# Patient Record
Sex: Female | Born: 1940
Health system: Southern US, Community
[De-identification: ages and names within clinical notes are randomized; demographics above are authoritative.]

## PROBLEM LIST (undated history)

## (undated) DIAGNOSIS — C801 Malignant (primary) neoplasm, unspecified: Secondary | ICD-10-CM

## (undated) DIAGNOSIS — I2510 Atherosclerotic heart disease of native coronary artery without angina pectoris: Secondary | ICD-10-CM

## (undated) DIAGNOSIS — K219 Gastro-esophageal reflux disease without esophagitis: Secondary | ICD-10-CM

## (undated) DIAGNOSIS — K56609 Unspecified intestinal obstruction, unspecified as to partial versus complete obstruction: Secondary | ICD-10-CM

## (undated) DIAGNOSIS — Z9889 Other specified postprocedural states: Secondary | ICD-10-CM

## (undated) DIAGNOSIS — I10 Essential (primary) hypertension: Secondary | ICD-10-CM

## (undated) DIAGNOSIS — D649 Anemia, unspecified: Secondary | ICD-10-CM

## (undated) DIAGNOSIS — Z923 Personal history of irradiation: Secondary | ICD-10-CM

## (undated) DIAGNOSIS — Z9221 Personal history of antineoplastic chemotherapy: Secondary | ICD-10-CM

## (undated) DIAGNOSIS — N189 Chronic kidney disease, unspecified: Secondary | ICD-10-CM

## (undated) DIAGNOSIS — R112 Nausea with vomiting, unspecified: Secondary | ICD-10-CM

## (undated) DIAGNOSIS — J189 Pneumonia, unspecified organism: Secondary | ICD-10-CM

## (undated) HISTORY — PX: APPENDECTOMY: SHX54

## (undated) HISTORY — PX: BREAST LUMPECTOMY: SHX2

## (undated) HISTORY — PX: ABDOMINAL HYSTERECTOMY: SHX81

## (undated) HISTORY — PX: ABDOMINAL SURGERY: SHX537

## (undated) HISTORY — PX: AUGMENTATION MAMMAPLASTY: SUR837

## (undated) HISTORY — PX: TONSILLECTOMY: SUR1361

## (undated) HISTORY — PX: BREAST EXCISIONAL BIOPSY: SUR124

---

## 2000-02-26 ENCOUNTER — Encounter: Payer: Self-pay | Admitting: Family Medicine

## 2000-02-26 ENCOUNTER — Encounter: Admission: RE | Admit: 2000-02-26 | Discharge: 2000-02-26 | Payer: Self-pay | Admitting: Family Medicine

## 2001-06-18 ENCOUNTER — Encounter: Admission: RE | Admit: 2001-06-18 | Discharge: 2001-06-18 | Payer: Self-pay | Admitting: Family Medicine

## 2001-06-18 ENCOUNTER — Encounter: Payer: Self-pay | Admitting: Family Medicine

## 2001-06-19 ENCOUNTER — Encounter: Payer: Self-pay | Admitting: Family Medicine

## 2001-06-19 ENCOUNTER — Encounter: Admission: RE | Admit: 2001-06-19 | Discharge: 2001-06-19 | Payer: Self-pay | Admitting: Family Medicine

## 2002-06-28 ENCOUNTER — Encounter: Payer: Self-pay | Admitting: Family Medicine

## 2002-06-28 ENCOUNTER — Encounter: Admission: RE | Admit: 2002-06-28 | Discharge: 2002-06-28 | Payer: Self-pay | Admitting: Family Medicine

## 2004-04-17 ENCOUNTER — Encounter: Admission: RE | Admit: 2004-04-17 | Discharge: 2004-04-17 | Payer: Self-pay | Admitting: Family Medicine

## 2004-04-26 ENCOUNTER — Encounter: Admission: RE | Admit: 2004-04-26 | Discharge: 2004-04-26 | Payer: Self-pay | Admitting: Family Medicine

## 2004-10-09 ENCOUNTER — Encounter: Admission: RE | Admit: 2004-10-09 | Discharge: 2004-10-09 | Payer: Self-pay | Admitting: Family Medicine

## 2005-06-18 ENCOUNTER — Encounter: Admission: RE | Admit: 2005-06-18 | Discharge: 2005-06-18 | Payer: Self-pay | Admitting: Family Medicine

## 2005-08-16 ENCOUNTER — Encounter: Admission: RE | Admit: 2005-08-16 | Discharge: 2005-08-16 | Payer: Self-pay | Admitting: Family Medicine

## 2006-07-17 ENCOUNTER — Encounter: Admission: RE | Admit: 2006-07-17 | Discharge: 2006-07-17 | Payer: Self-pay | Admitting: Family Medicine

## 2006-07-17 IMAGING — MG MM DIAGNOSTIC BILATERAL
4 series · 4 of 4 positions shown · non-contrast
Comparison: none

DG DIAGNOSTIC BILATERAL
Bilateral CC and MLO view(s) were taken.

DIGITAL BILATERAL DIAGNOSTIC MAMMOGRAM:
CLINICAL DATA: Follow-up left breast calcifications.
Comparison studies are dated [DATE] and [DATE].
The dense fibroglandular tissue is symmetric and stable.  Free silicone on the right and capsular 
calcifications in both breasts are unchanged. No dominant masses, suspicious calcifications, or 
areas of non-surgical distortion are seen bilaterally.

[R CC]
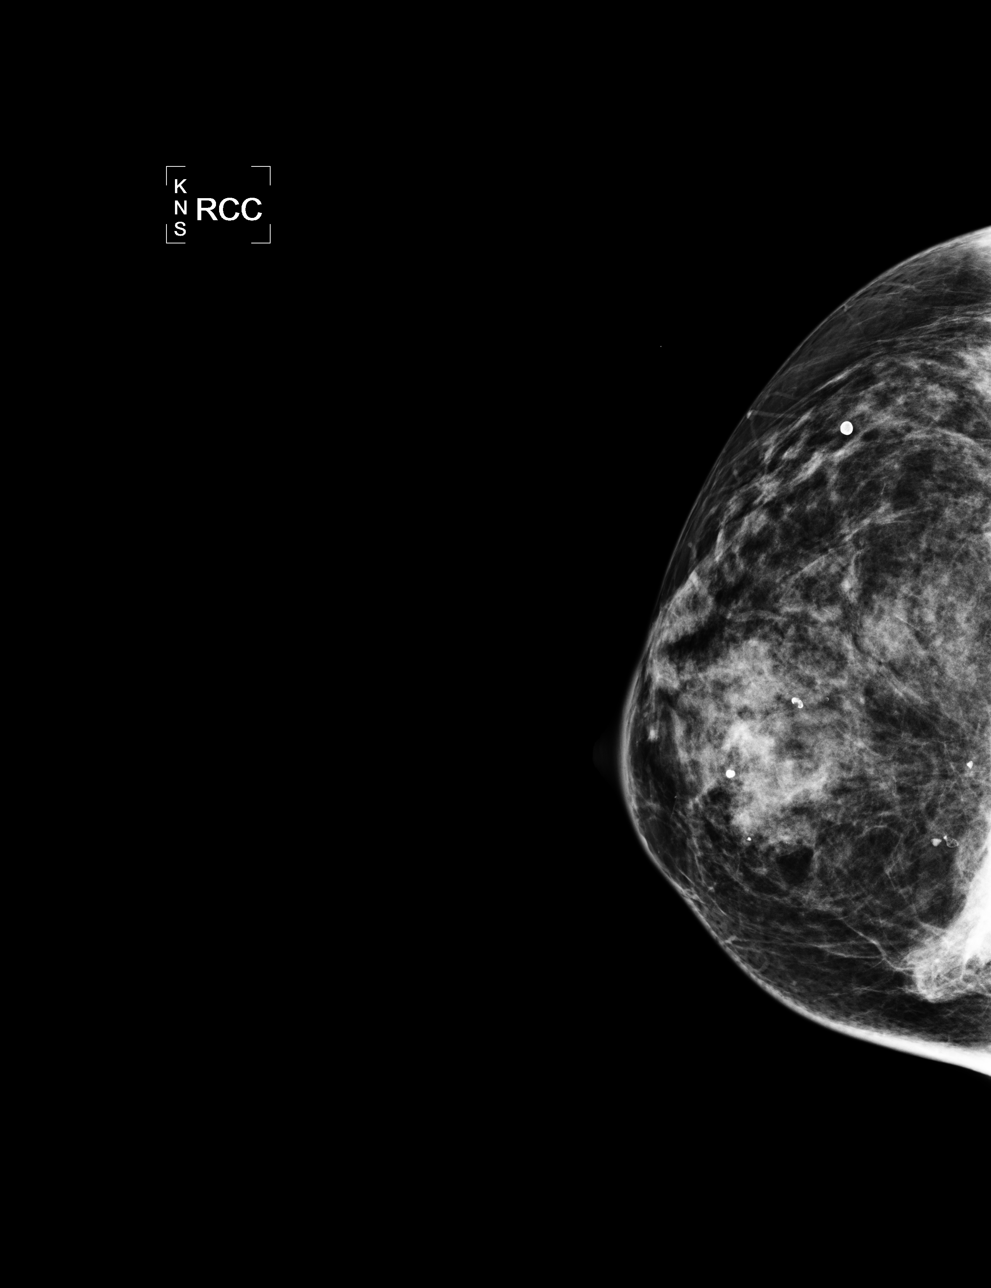

[L CC]
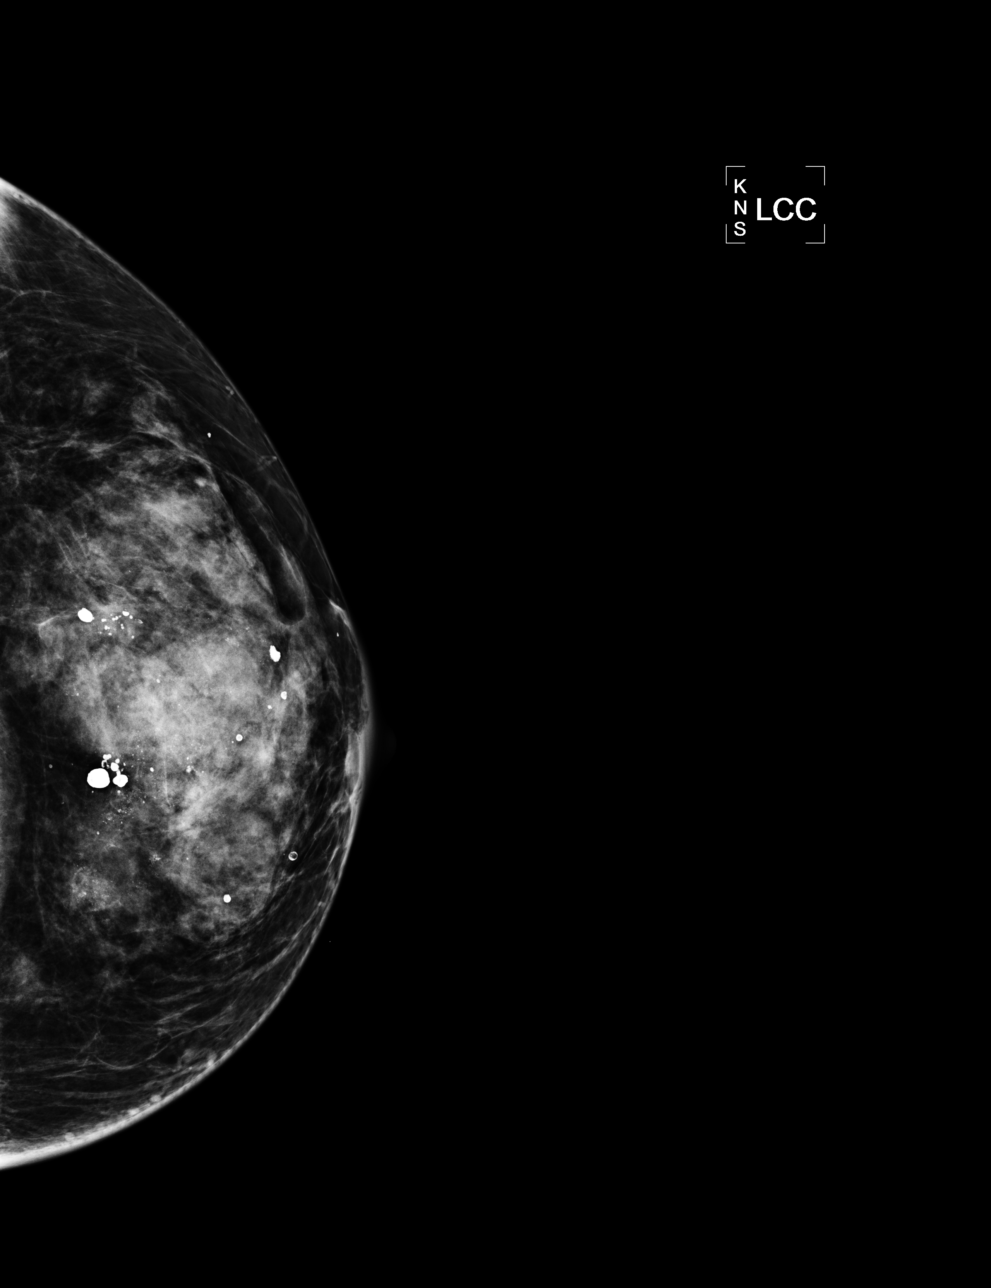

[L MLO]
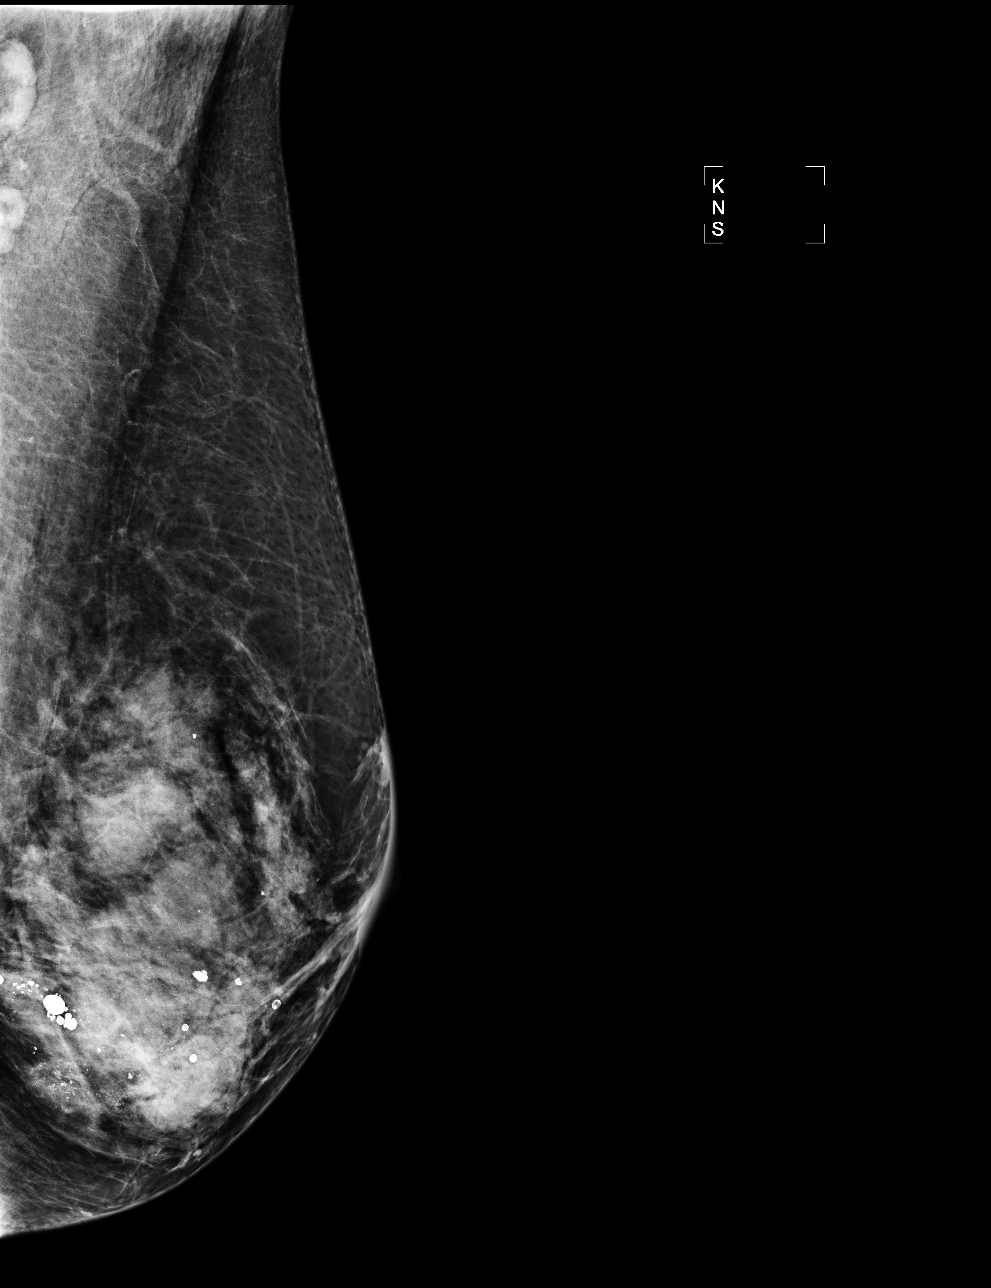

[R MLO]
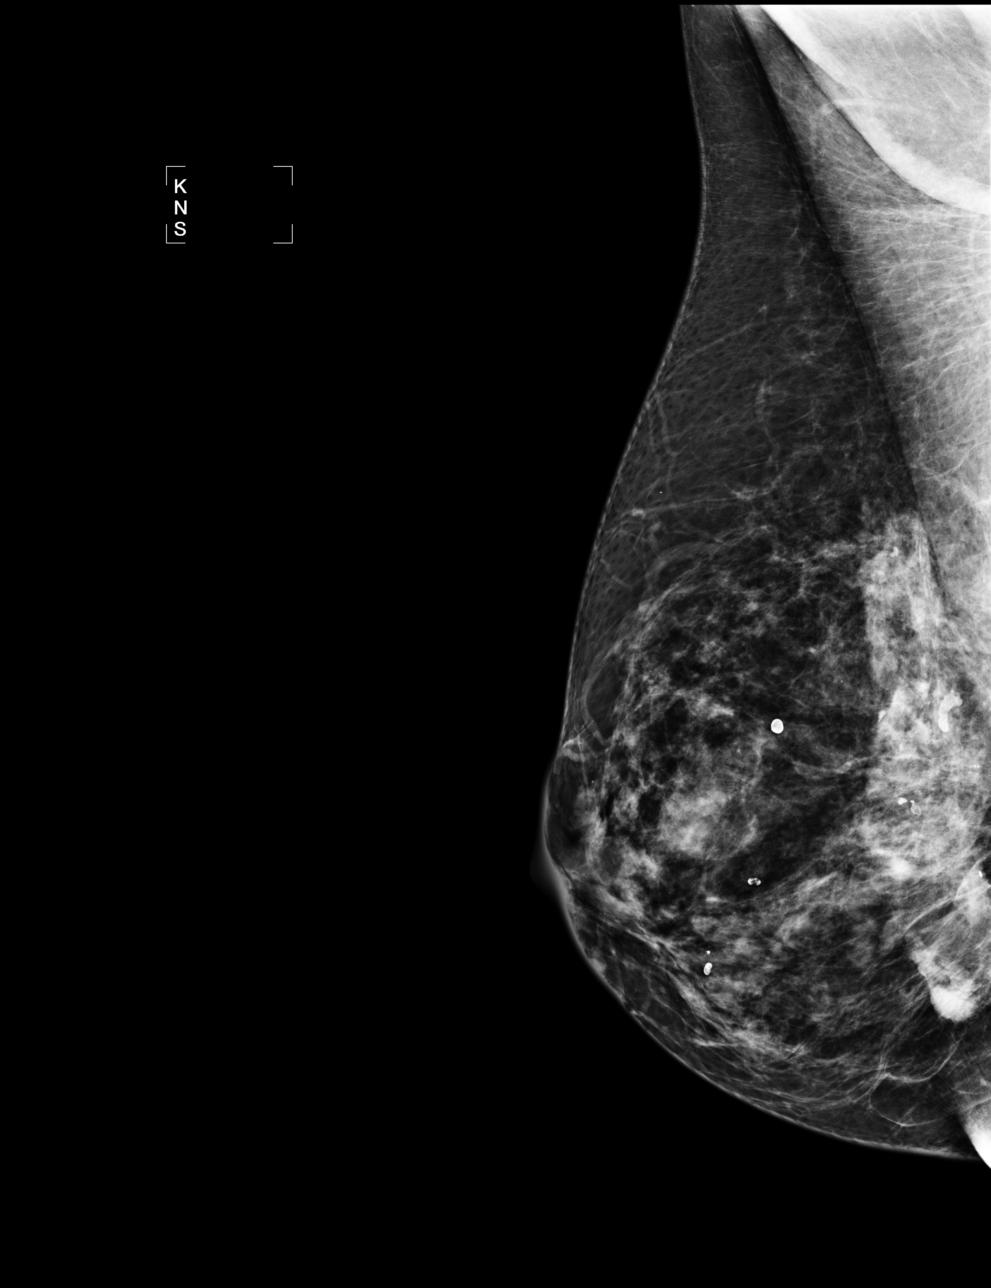

[4 of 4 positions shown; findings below may reference images not displayed]

IMPRESSION: Stable mammogram with no specific radiographic evidence of malignancy bilaterally. Screening 
mammogram in one year is recommended.

ASSESSMENT: Benign - BI-RADS 2

Routine screening mammogram in 1 year.
, THIS PROCEDURE WAS A DIGITAL MAMMOGRAM

## 2007-09-16 DIAGNOSIS — J309 Allergic rhinitis, unspecified: Secondary | ICD-10-CM | POA: Insufficient documentation

## 2007-09-16 DIAGNOSIS — M810 Age-related osteoporosis without current pathological fracture: Secondary | ICD-10-CM | POA: Insufficient documentation

## 2007-11-03 ENCOUNTER — Encounter: Admission: RE | Admit: 2007-11-03 | Discharge: 2007-11-03 | Payer: Self-pay | Admitting: Family Medicine

## 2007-11-03 IMAGING — MG MM SCREEN MAMMOGRAM BILATERAL
4 series · 4 of 4 positions shown · non-contrast
Comparison: Prior studies.

DG SCREEN MAMMOGRAM BILATERAL
Bilateral CC and MLO view(s) were taken.
Prior study comparison: [DATE], bilateral diagnostic mammogram.

DIGITAL SCREENING MAMMOGRAM WITH CAD:

[R CC]
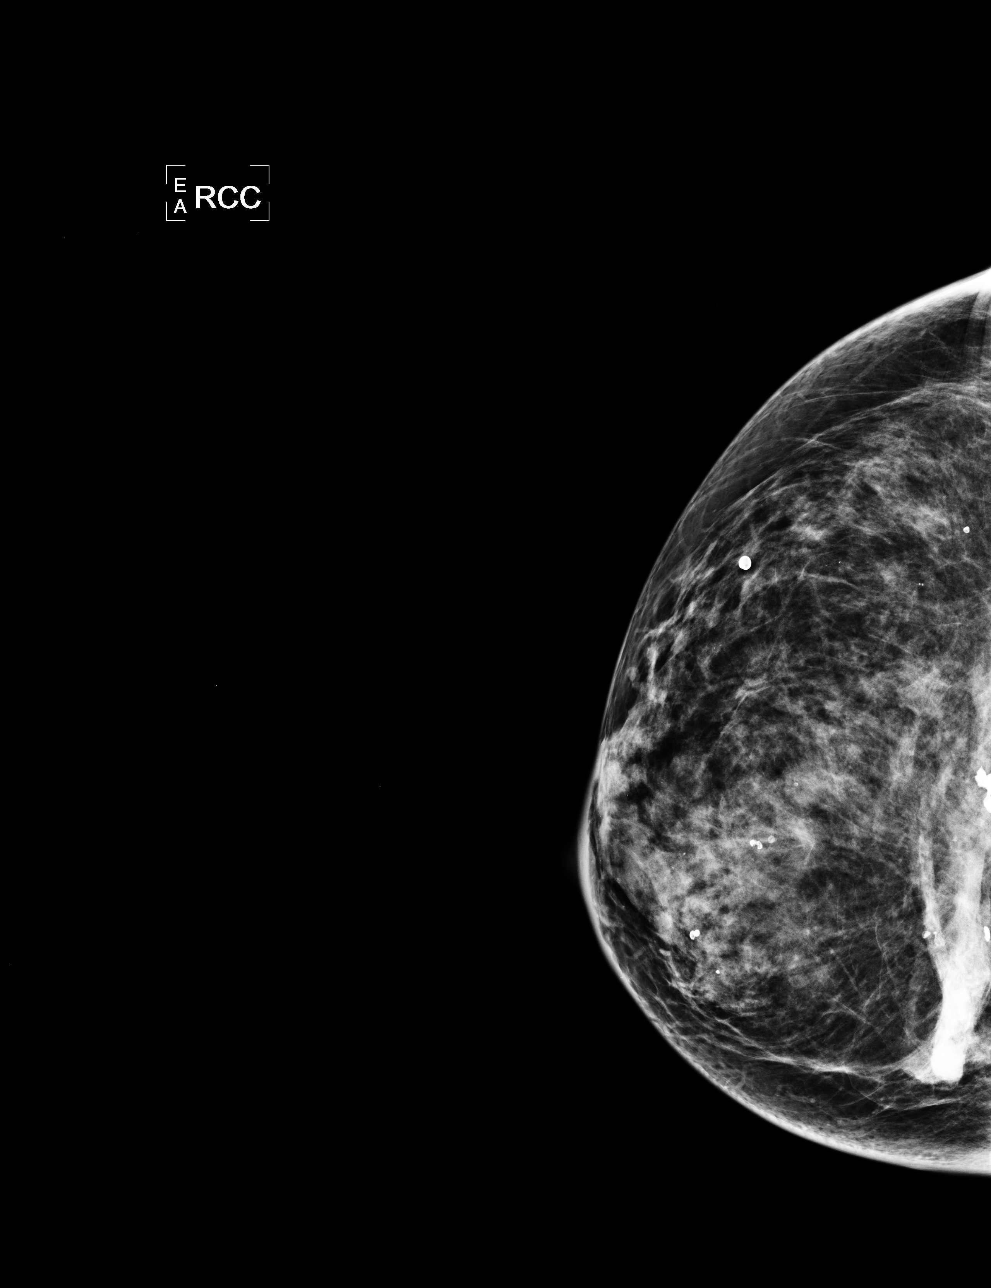

[L CC]
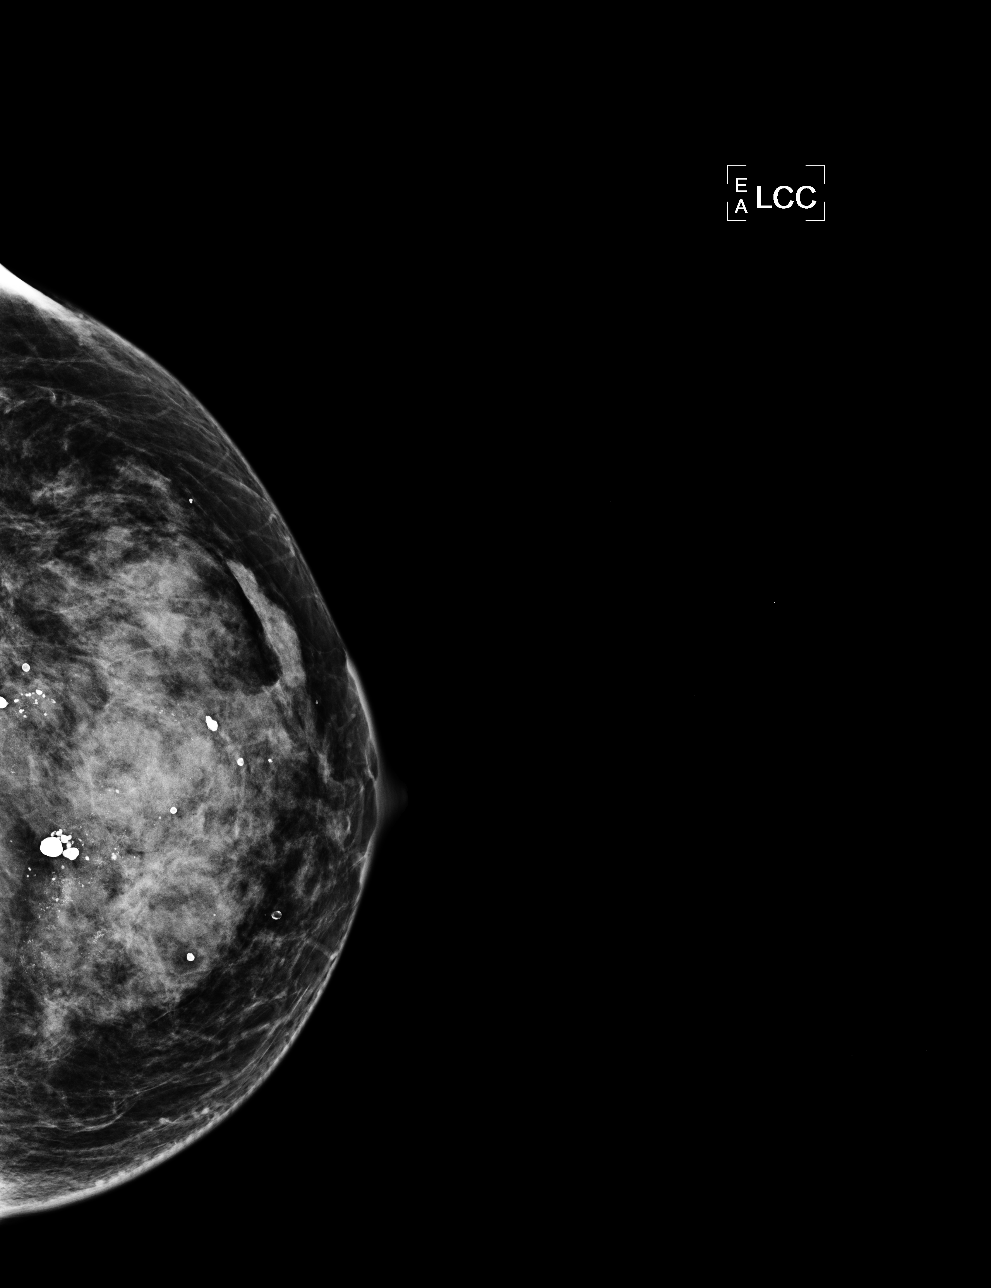

[L MLO]
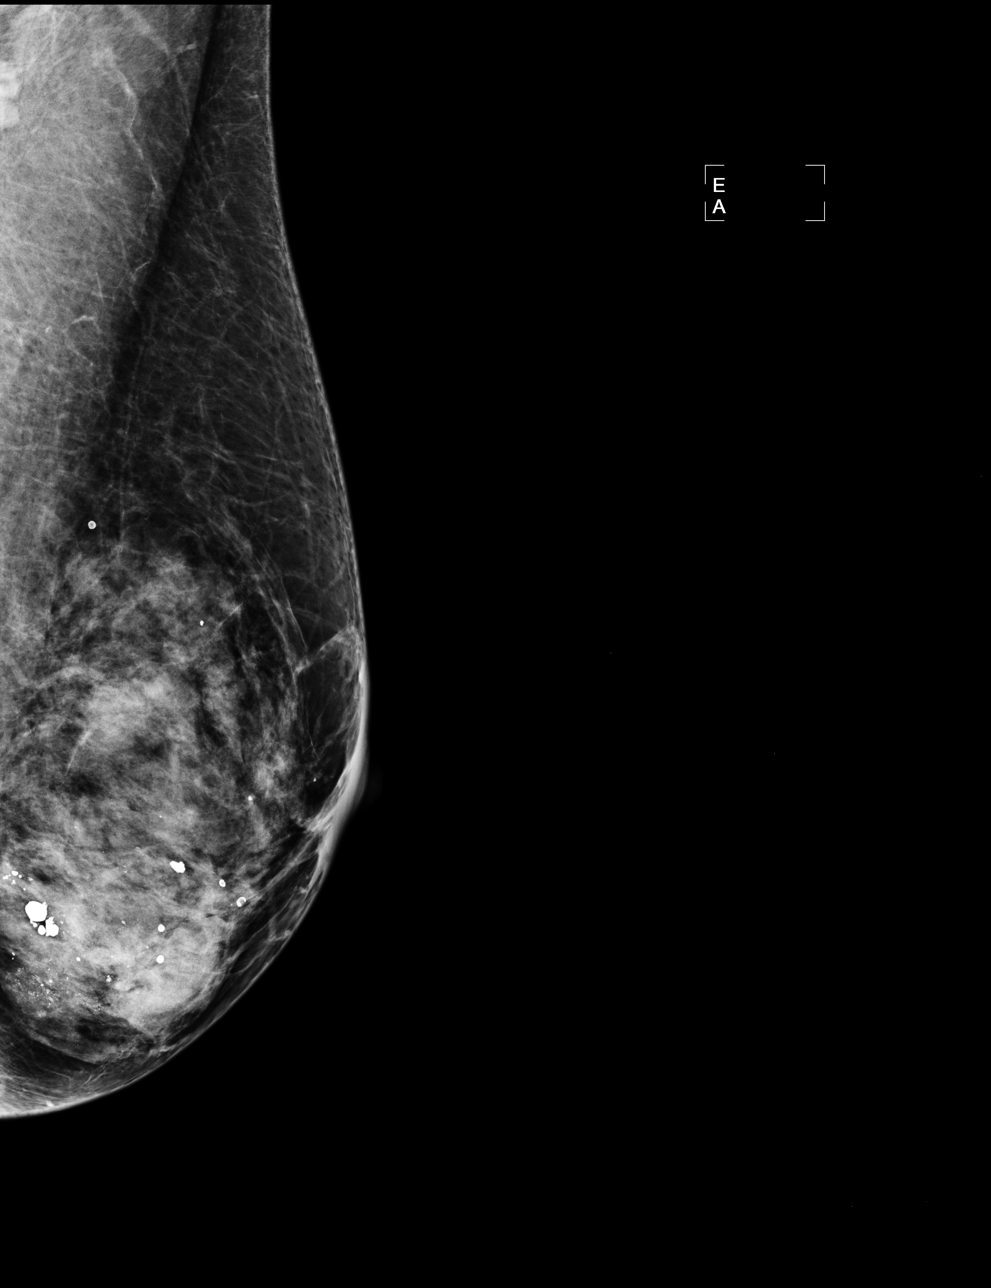

[R MLO]
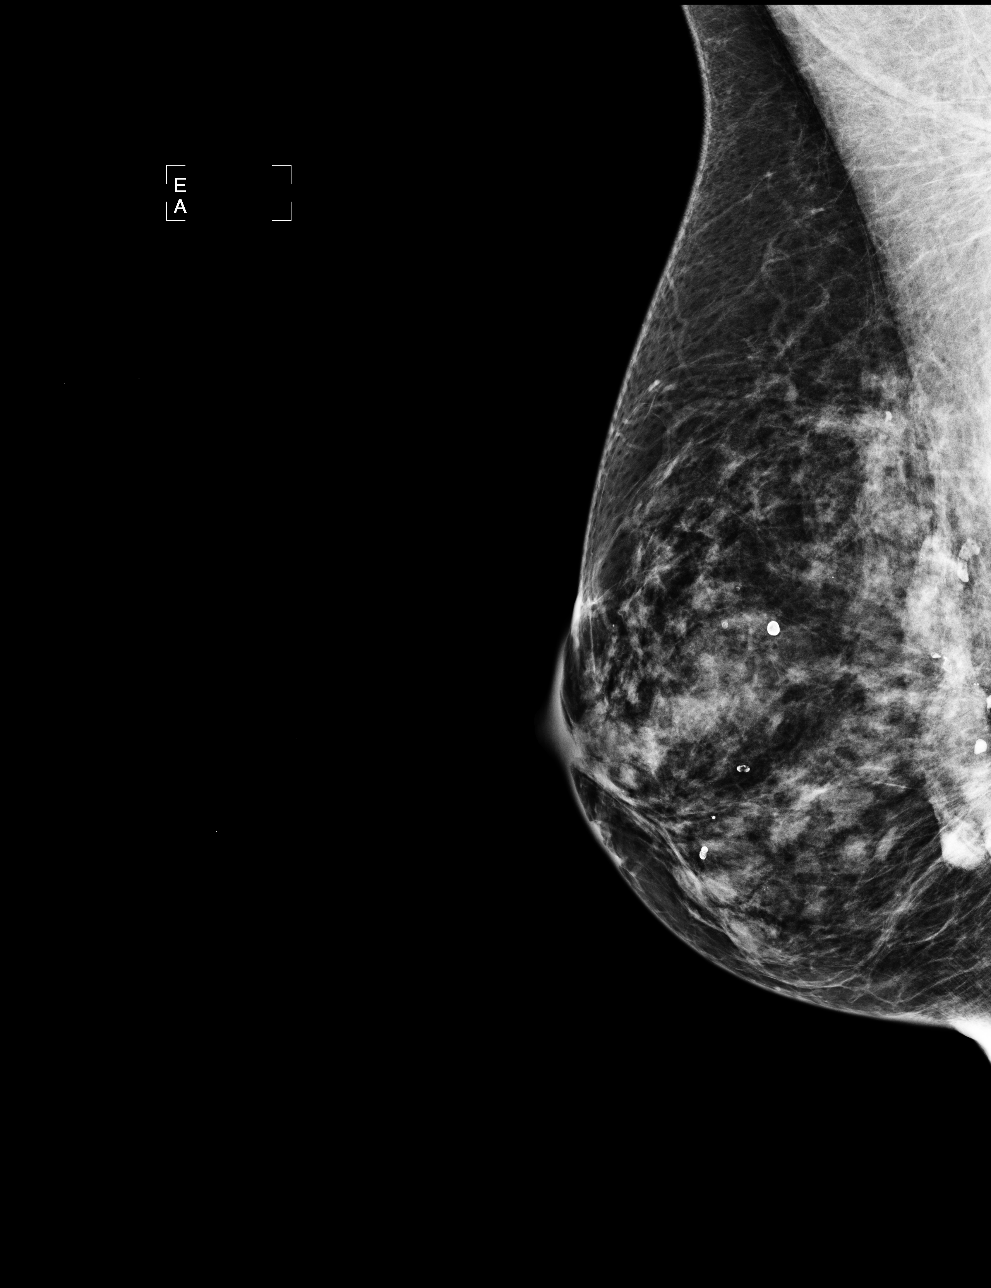

[4 of 4 positions shown; findings below may reference images not displayed]

The breast tissue is heterogeneously dense.  There is no dominant mass, architectural distortion or
calcification to suggest malignancy.
IMPRESSION: No mammographic evidence of malignancy.  Suggest yearly screening mammography.

ASSESSMENT: Negative - BI-RADS 1

Screening mammogram in 1 year.
ANALYZED BY COMPUTER AIDED DETECTION. , THIS PROCEDURE WAS A DIGITAL MAMMOGRAM.

## 2008-06-03 DIAGNOSIS — R509 Fever, unspecified: Secondary | ICD-10-CM | POA: Insufficient documentation

## 2008-12-07 ENCOUNTER — Encounter: Admission: RE | Admit: 2008-12-07 | Discharge: 2008-12-07 | Payer: Self-pay | Admitting: Family Medicine

## 2008-12-07 IMAGING — MG MM SCREEN MAMMOGRAM BILATERAL
2 series · 2 of 2 positions shown · non-contrast
Comparison: none

DG SCREEN MAMMOGRAM BILATERAL
Bilateral CC and MLO view(s) were taken.

DIGITAL SCREENING MAMMOGRAM WITH CAD:
The breast tissue is heterogeneously dense.  No masses or malignant type calcifications are 
identified.  Compared with prior studies.

[L CC]
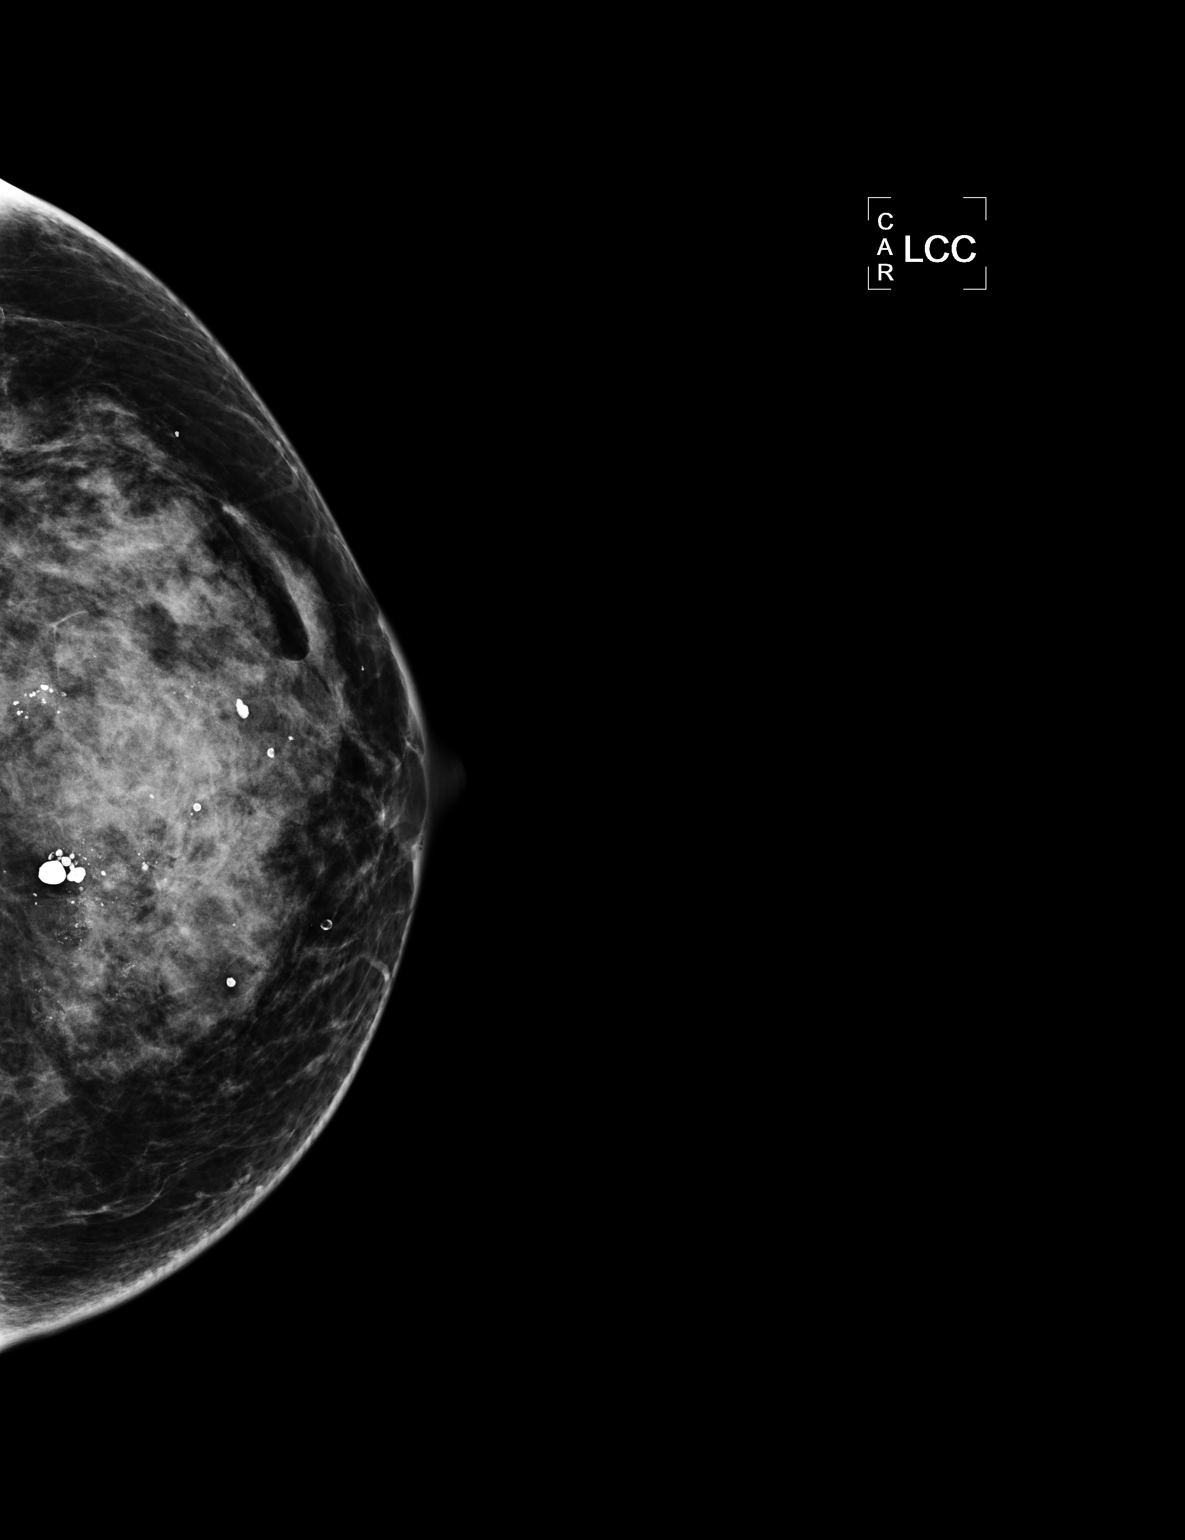

[R MLO]
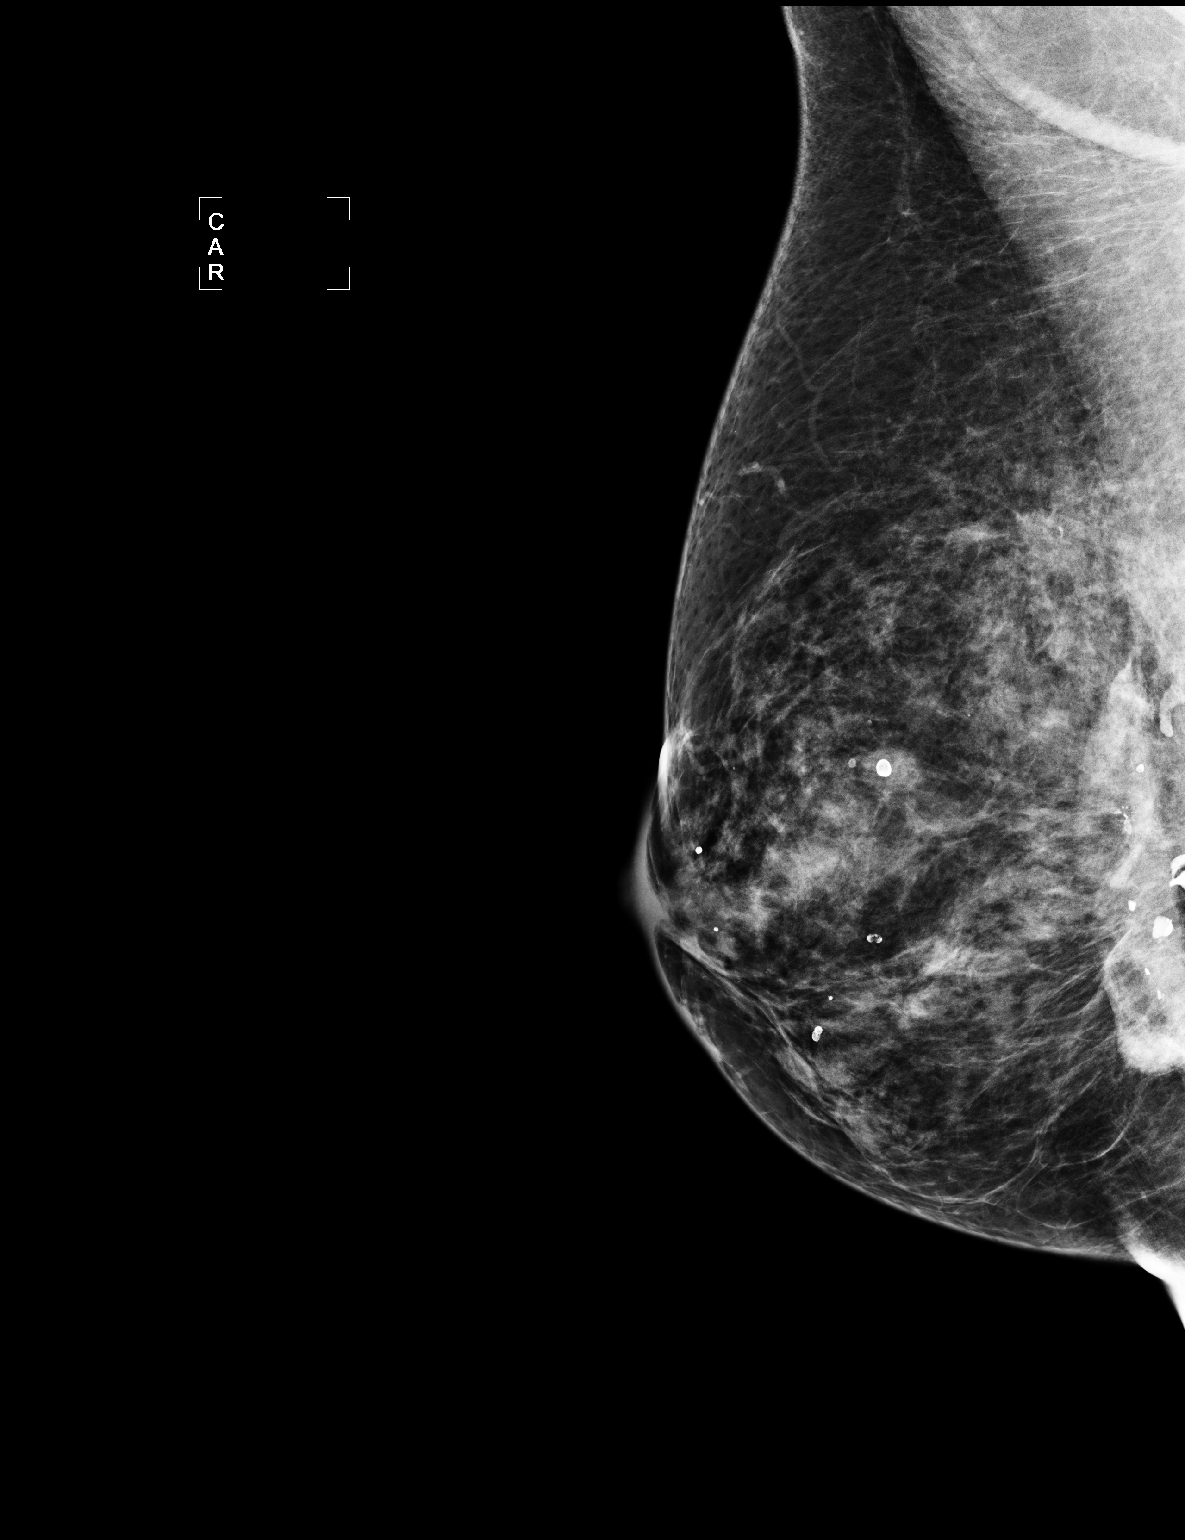

[2 of 2 positions shown; findings below may reference images not displayed]

IMPRESSION: No specific mammographic evidence of malignancy.  Next screening mammogram is recommended in one 
year.

A result letter of this screening mammogram will be mailed directly to the patient.

ASSESSMENT: Negative - BI-RADS 1

Screening mammogram in 1 year.
ANALYZED BY COMPUTER AIDED DETECTION. , THIS PROCEDURE WAS A DIGITAL MAMMOGRAM.

## 2010-03-21 ENCOUNTER — Encounter: Admission: RE | Admit: 2010-03-21 | Discharge: 2010-03-21 | Payer: Self-pay | Admitting: Family Medicine

## 2010-03-21 IMAGING — MG MM DIGITAL SCREENING
4 series · 4 of 4 positions shown · non-contrast
Comparison: none

DG SCREEN MAMMOGRAM BILATERAL
Bilateral CC and MLO view(s) were taken.

DIGITAL SCREENING MAMMOGRAM WITH CAD:
The breast tissue is heterogeneously dense  A possible mass is noted in the left breast.  Spot 
compression views and possibly sonography are recommended for further evaluation.  In the right 
breast, no masses or malignant type calcifications are identified.  Compared with prior studies 
[DATE].
Images were processed with CAD.

[R CC]
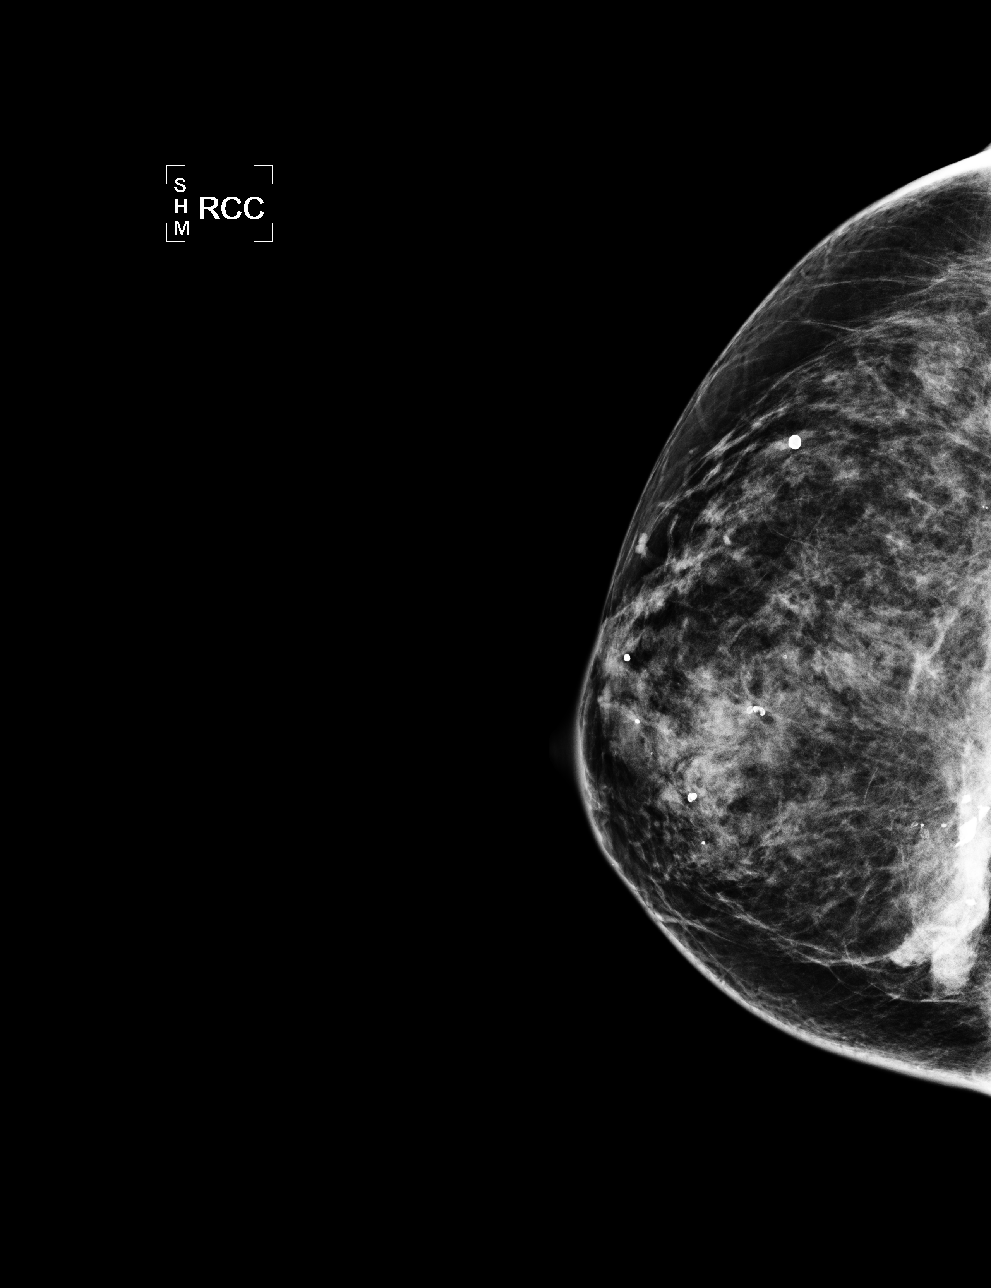

[L CC]
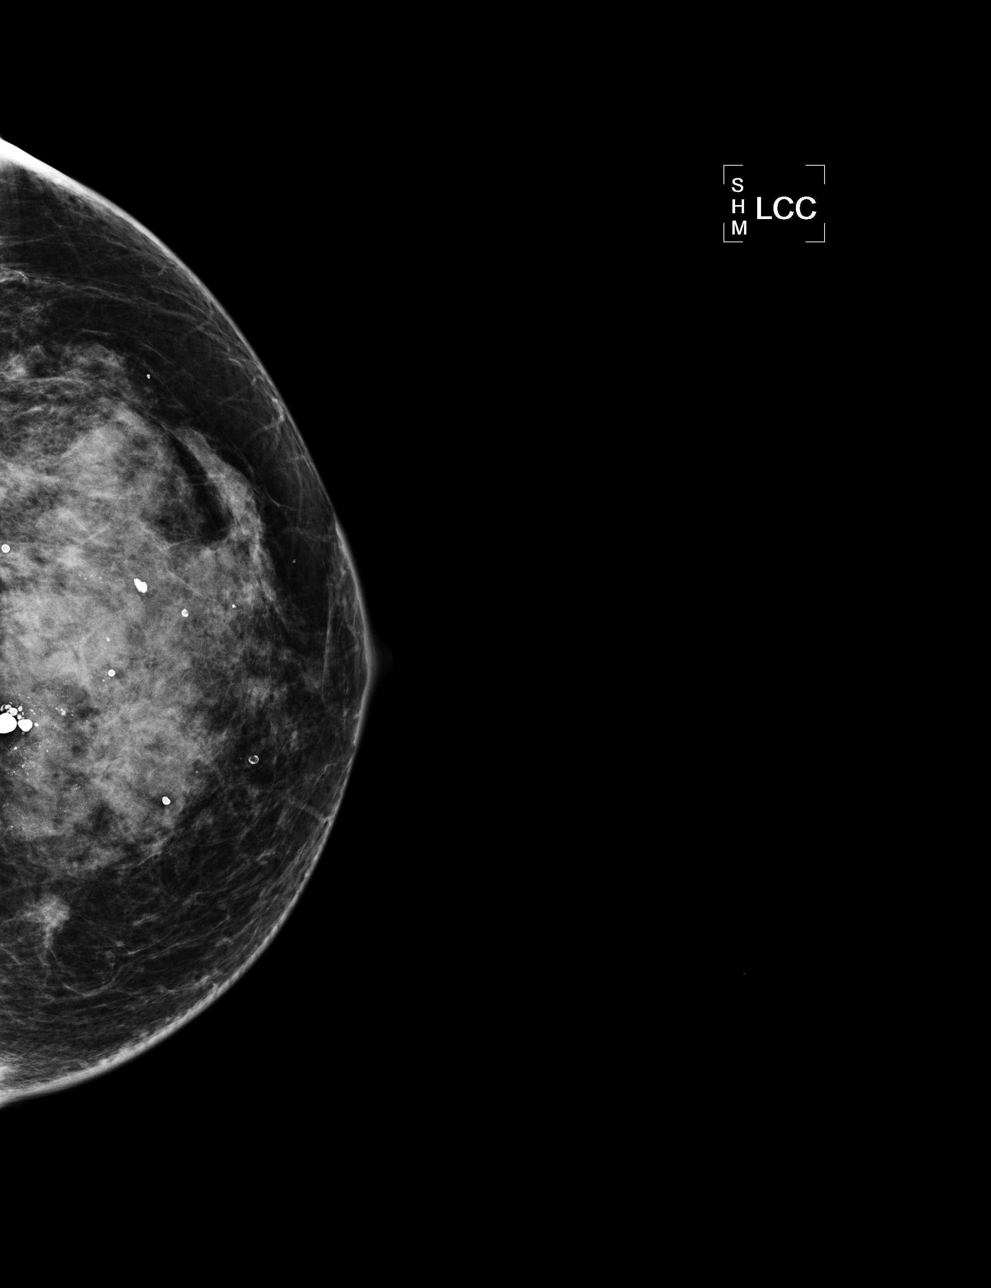

[L MLO]
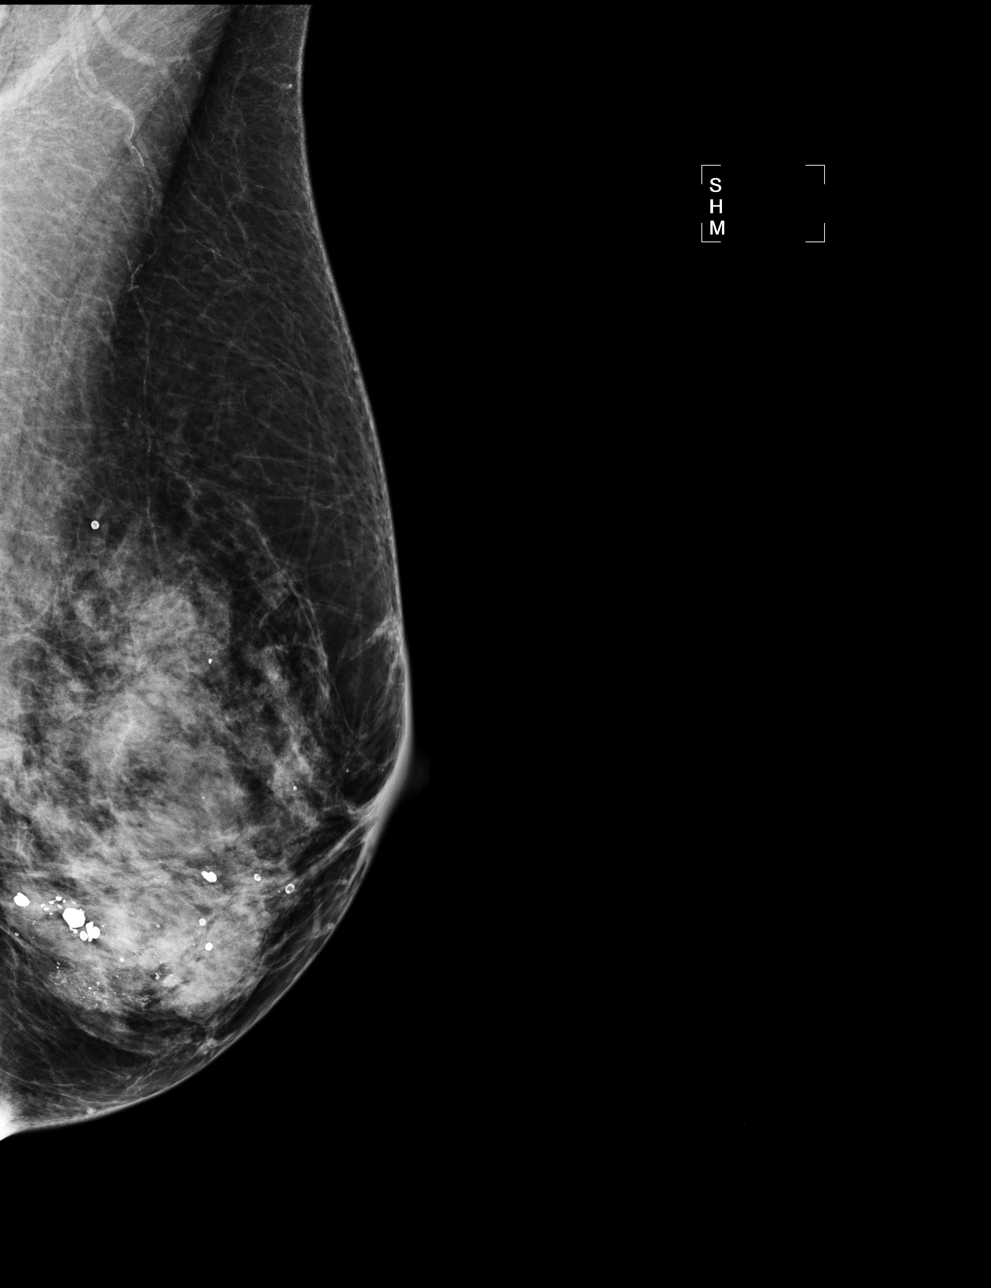

[R MLO]
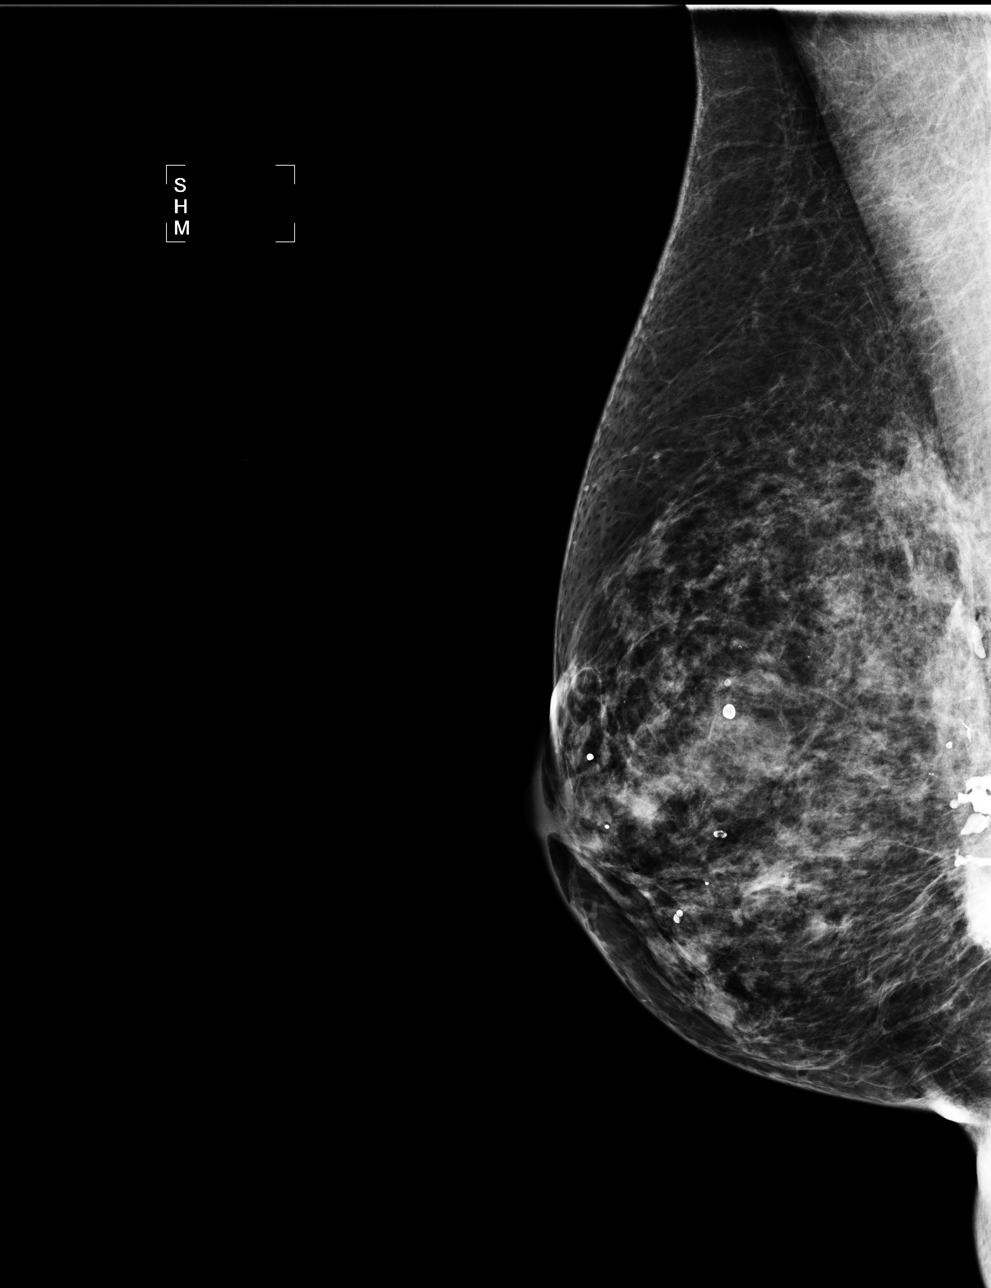

[4 of 4 positions shown; findings below may reference images not displayed]

IMPRESSION: Possible mass, left breast.  Additional evaluation is indicated.  The patient will be contacted for
additional studies and a supplementary report will follow.  No specific mammographic evidence of 
malignancy, right breast.

ASSESSMENT: Need additional imaging evaluation and/or prior mammograms for comparison - BI-RADS 0

Further imaging of the left breast.
,

## 2010-04-23 ENCOUNTER — Encounter: Admission: RE | Admit: 2010-04-23 | Discharge: 2010-04-23 | Payer: Self-pay | Admitting: Family Medicine

## 2010-04-23 IMAGING — MG MM DIGITAL DIAG LTD L
2 series · 2 of 2 positions shown · non-contrast
Comparison: Previous examinations, including the screening
mammogram dated [DATE].

CLINICAL DATA: Possible left breast mass at recent screening
mammography.

DIGITAL DIAGNOSTIC BILATERAL MAMMOGRAM  AND LEFT BREAST ULTRASOUND:

[L CC]
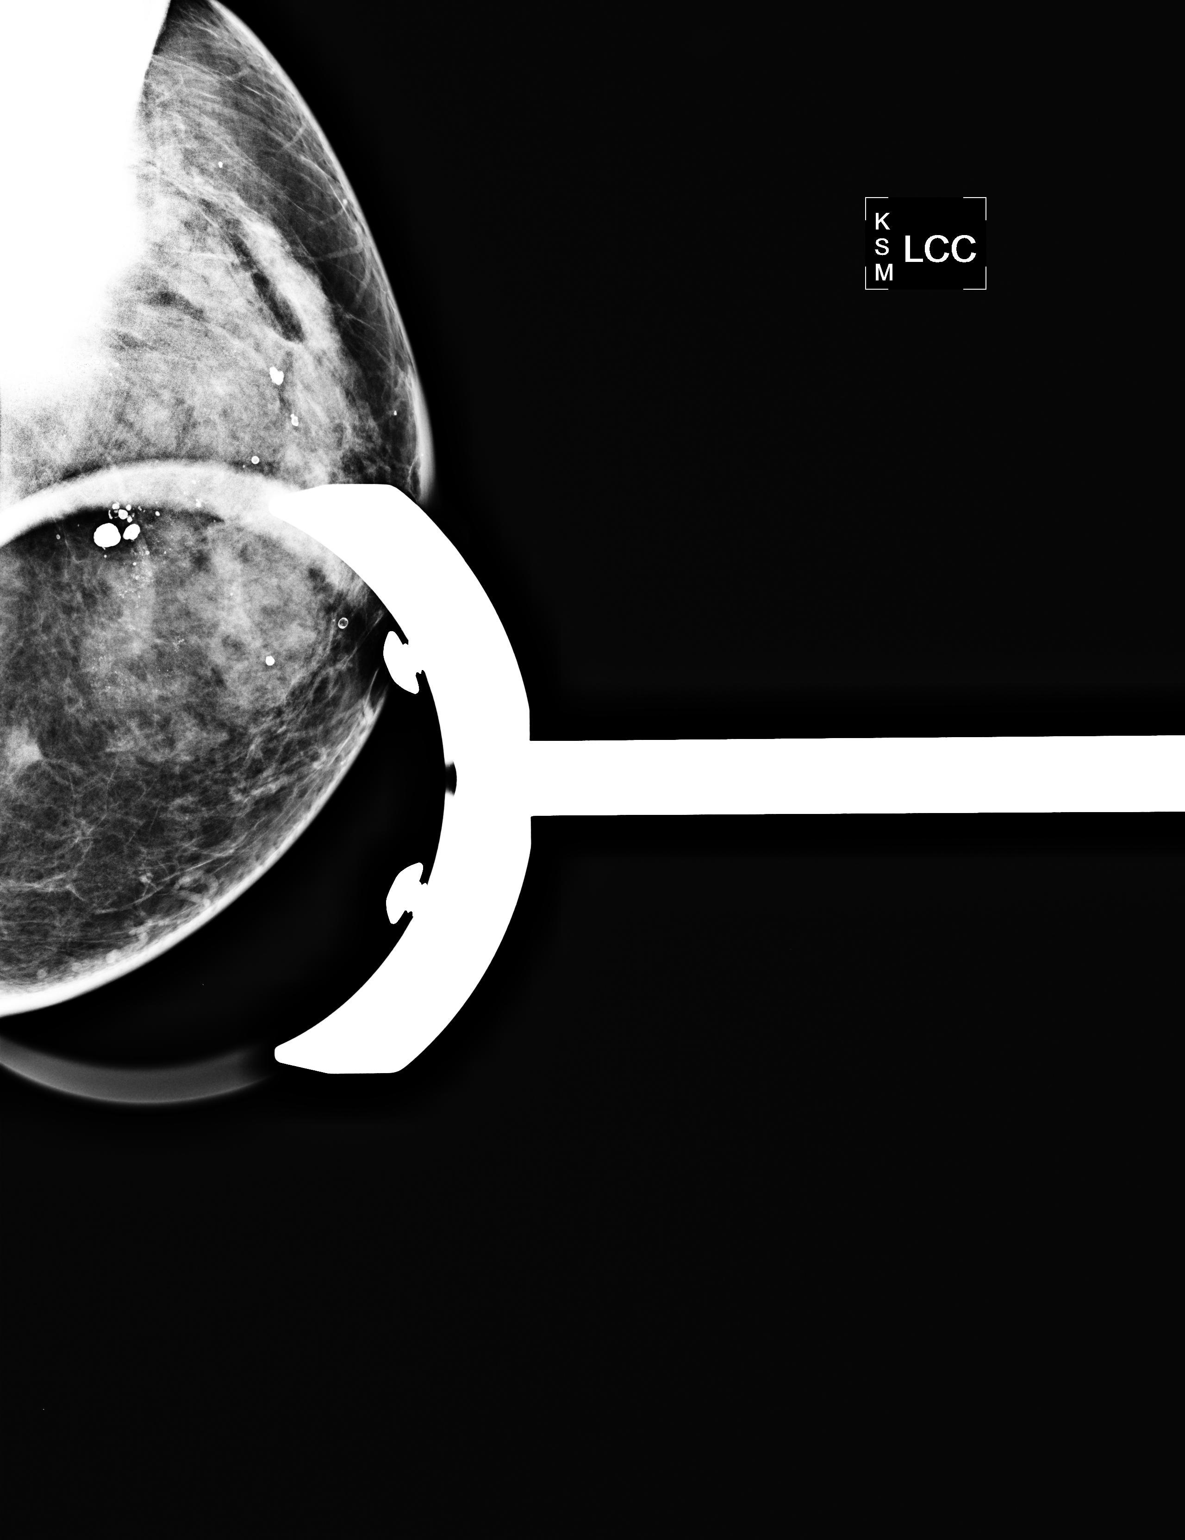

[L MLO]
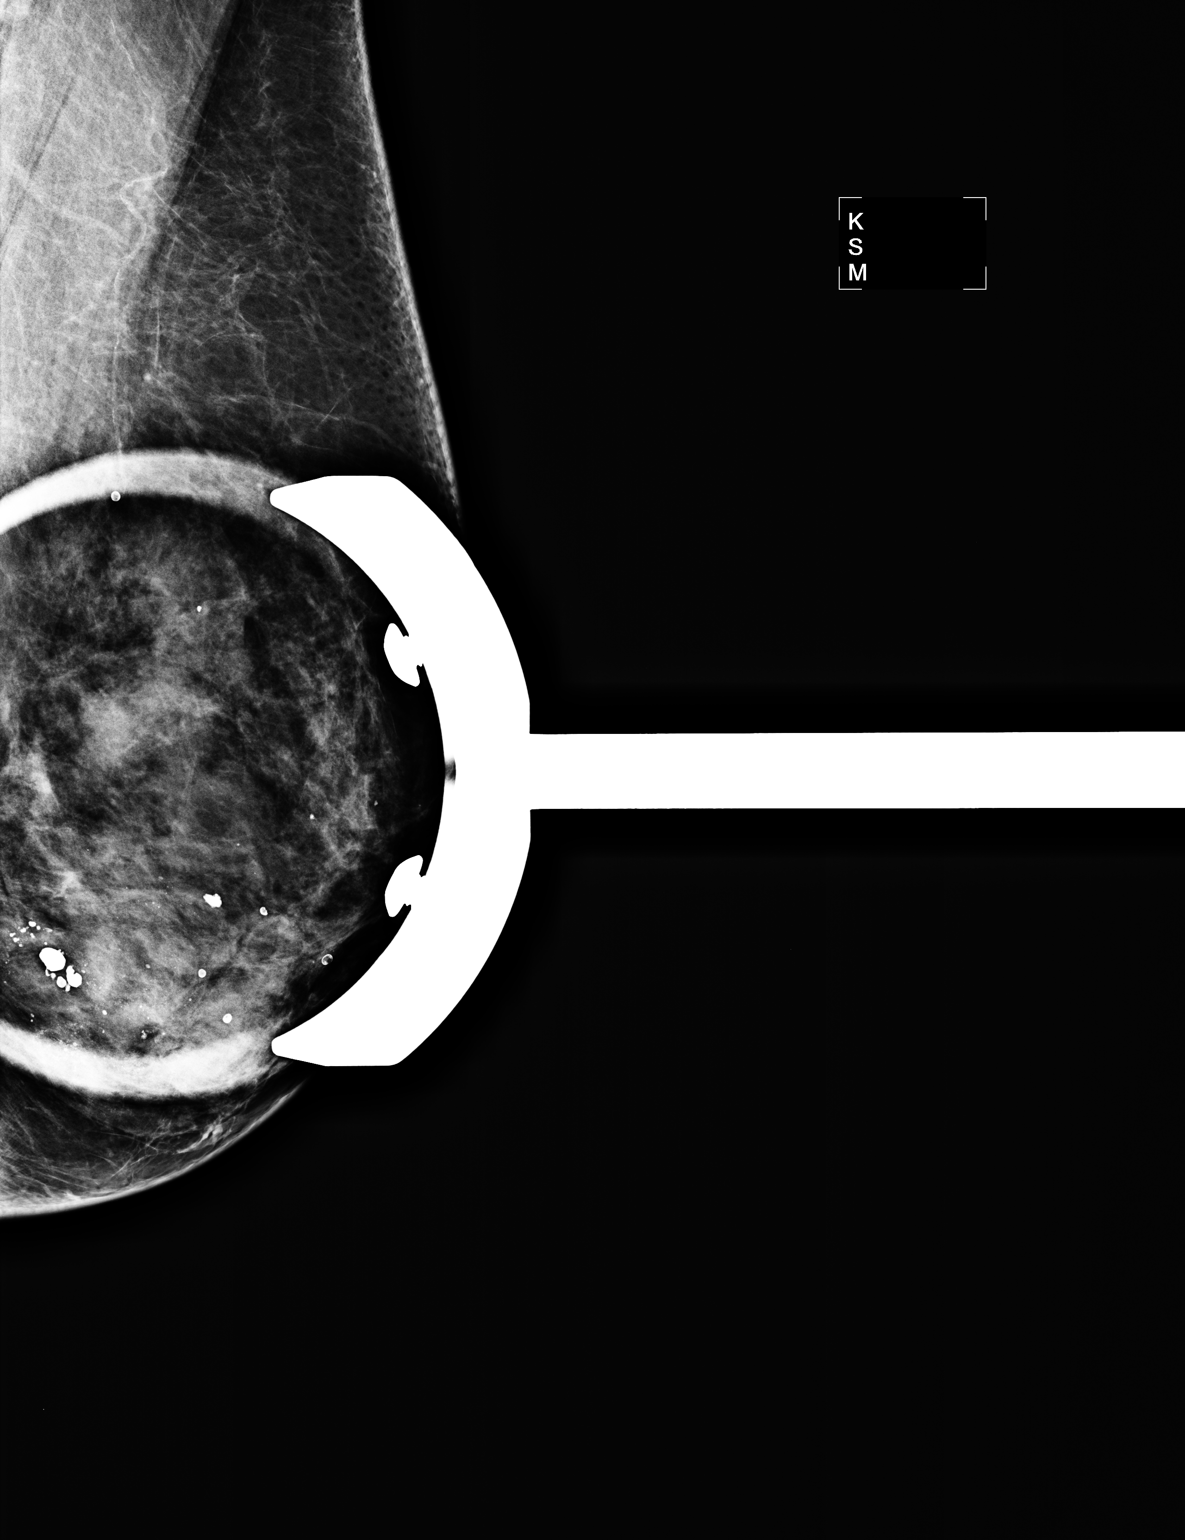

[2 of 2 positions shown; findings below may reference images not displayed]

FINDINGS: Spot compression views of the left breast demonstrate no
persistent mass-like density in the oblique projection.  In the
craniocaudal projection, there is a small persistent, somewhat
rounded area of increased density deep in the far medial aspect of
the breast.  This is in an area of previously demonstrated less
dense fibroglandular tissue.

On physical exam, no mass is palpable in the medial left breast.

Ultrasound is performed, showing normal appearing breast tissue
throughout the medial left breast with no visible mass.
IMPRESSION: The recently suspected left breast mass most likely represents a
normal island of fibroglandular tissue.  As a precaution, a follow-
up left diagnostic mammogram is recommended in 6 months to assess
stability.  This has been discussed with the patient.

BI-RADS CATEGORY 3:  Probably benign finding(s) - short interval
follow-up suggested.

## 2010-06-07 ENCOUNTER — Encounter: Admission: RE | Admit: 2010-06-07 | Discharge: 2010-06-07 | Payer: Self-pay | Admitting: Family Medicine

## 2010-06-07 IMAGING — CR DG LUMBAR SPINE COMPLETE 4+V
5 series · 5 of 5 positions shown · non-contrast
Comparison: None.

CLINICAL DATA: Fell in [REDACTED] with worsening of back
pain

LUMBAR SPINE - COMPLETE 4+ VIEW

[view not recorded (1 of 5)]
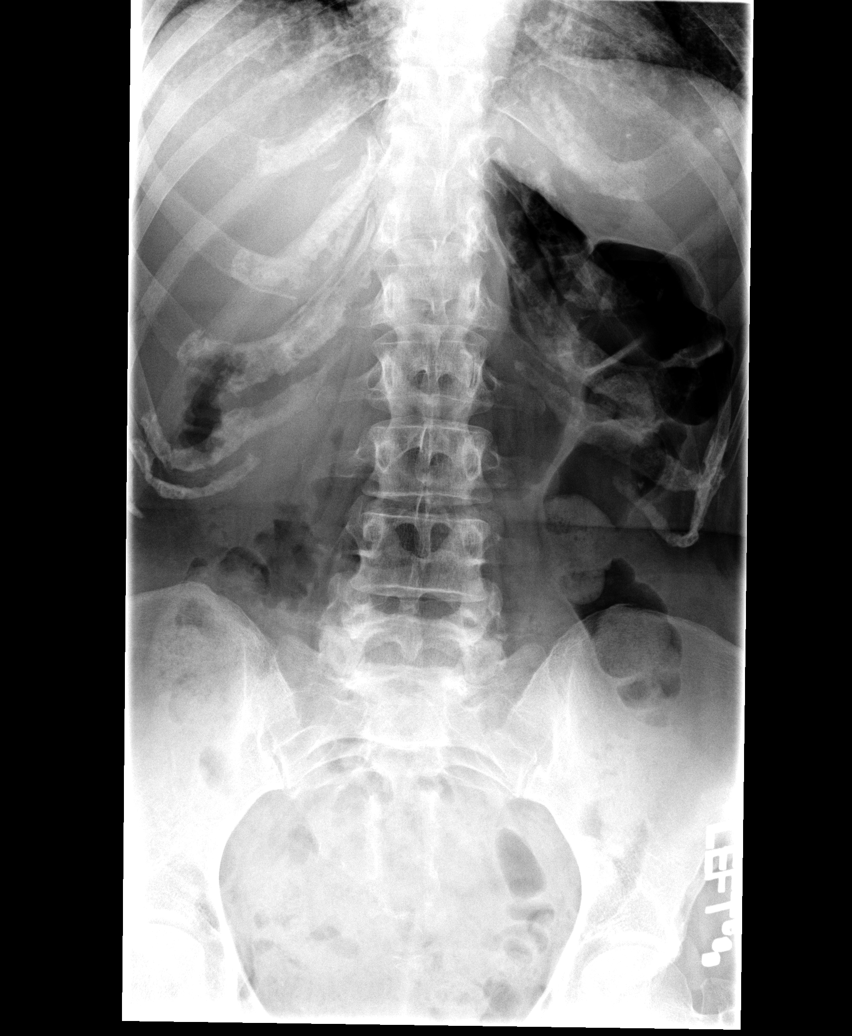

[view not recorded (2 of 5)]
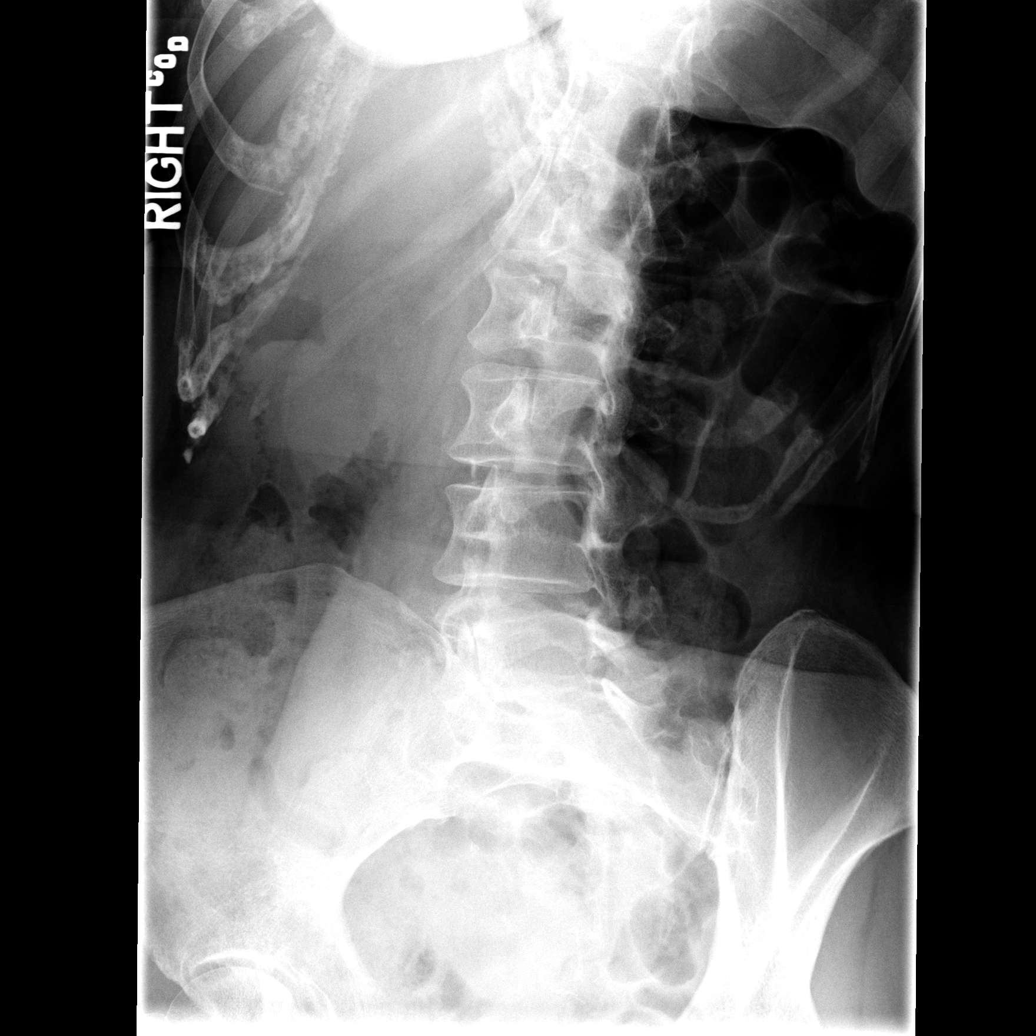

[view not recorded (3 of 5)]
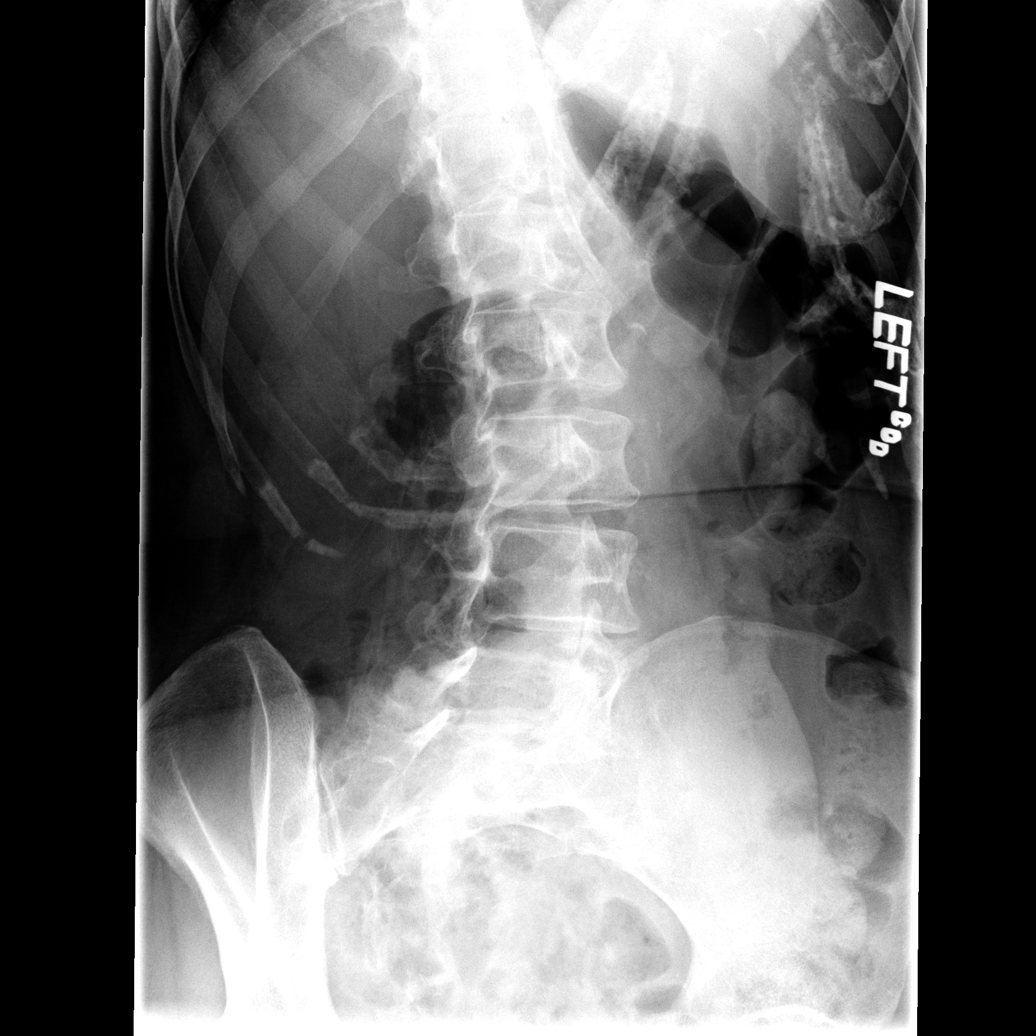

[view not recorded (4 of 5)]
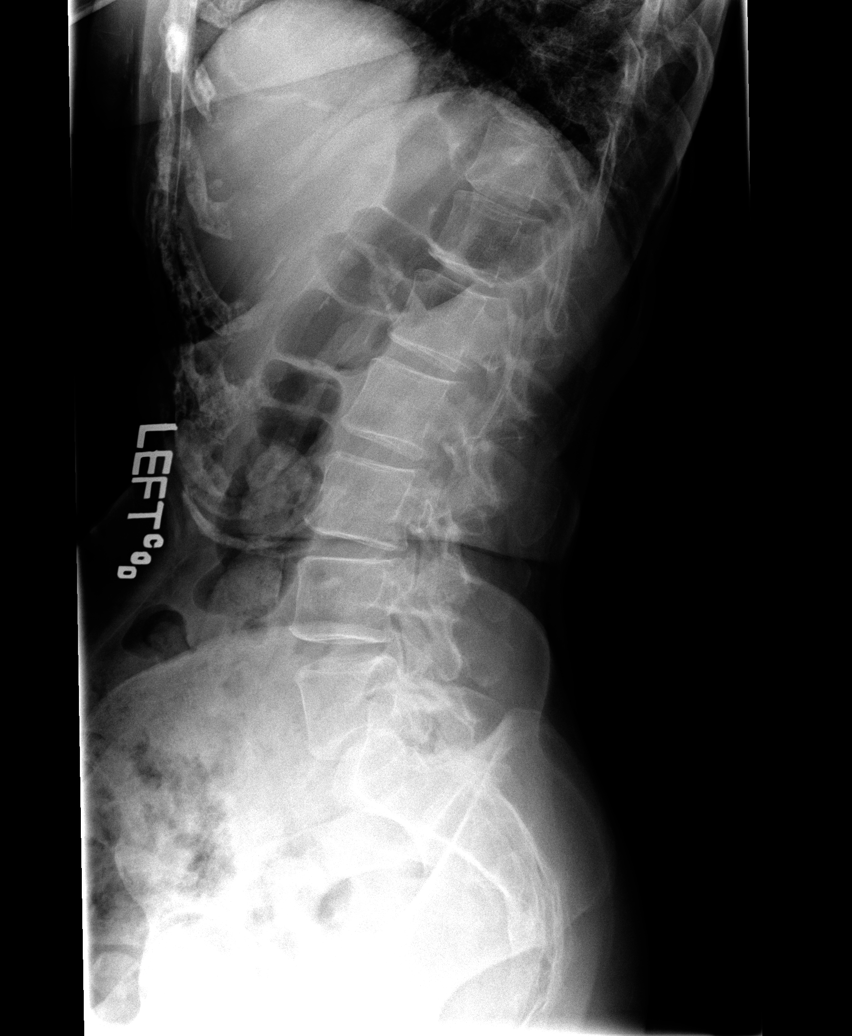

[view not recorded (5 of 5)]
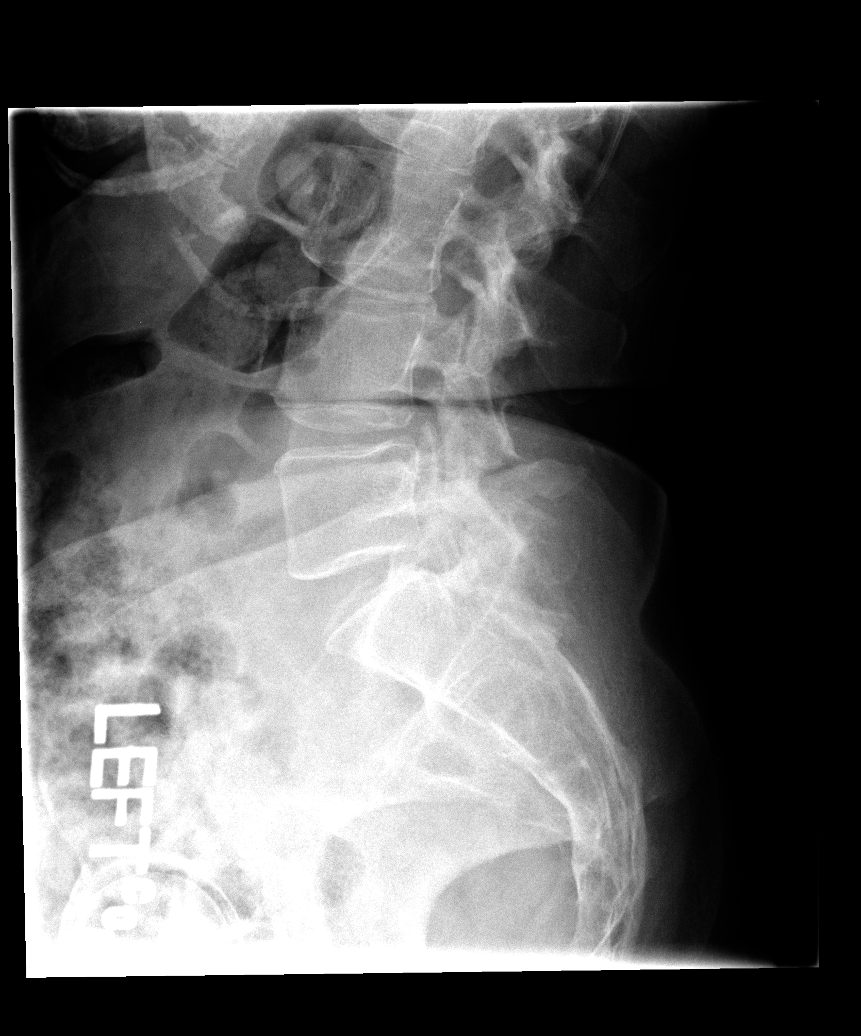

[5 of 5 positions shown; findings below may reference images not displayed]

FINDINGS: The lumbar vertebrae are in normal alignment with
relatively normal intervertebral disc spaces.  No compression
deformity is seen.  There is some degenerative change involving the
facet joints at L5- S1.  The SI joints appear normal.
IMPRESSION: Normal alignment with normal disc spaces.  No compression
deformity.  Mild degenerative change of the facet joints of L5-S1.

## 2010-06-07 IMAGING — CR DG THORACIC SPINE 3V
3 series · 3 of 3 positions shown · non-contrast
Comparison: None.

CLINICAL DATA: Fell in [REDACTED] with back pain which
has been worsening

THORACIC SPINE - 2 VIEW + SWIMMERS

[view not recorded (1 of 3)]
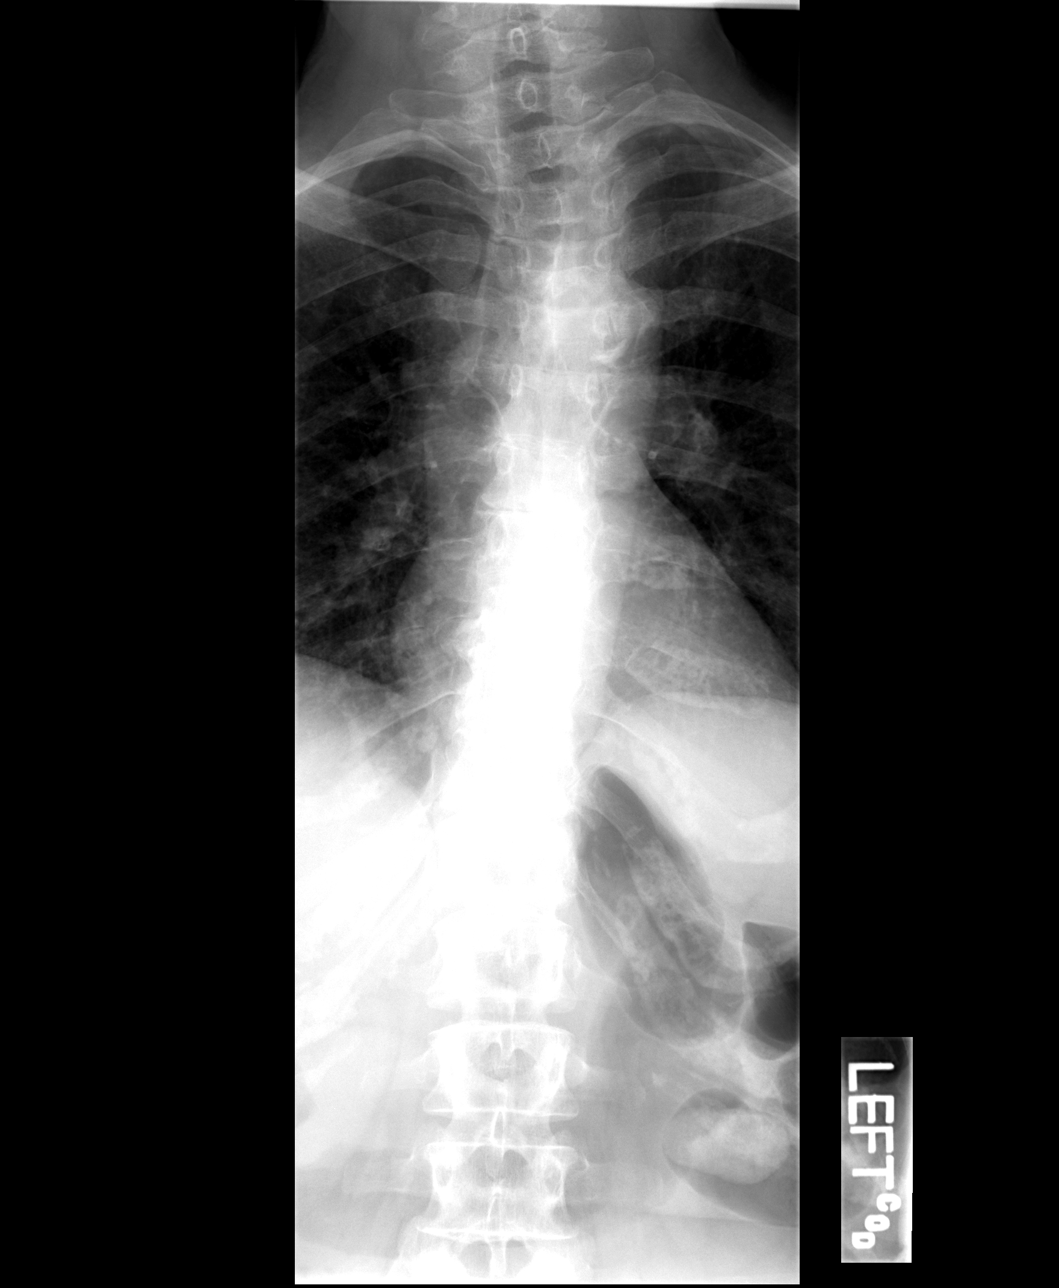

[view not recorded (2 of 3)]
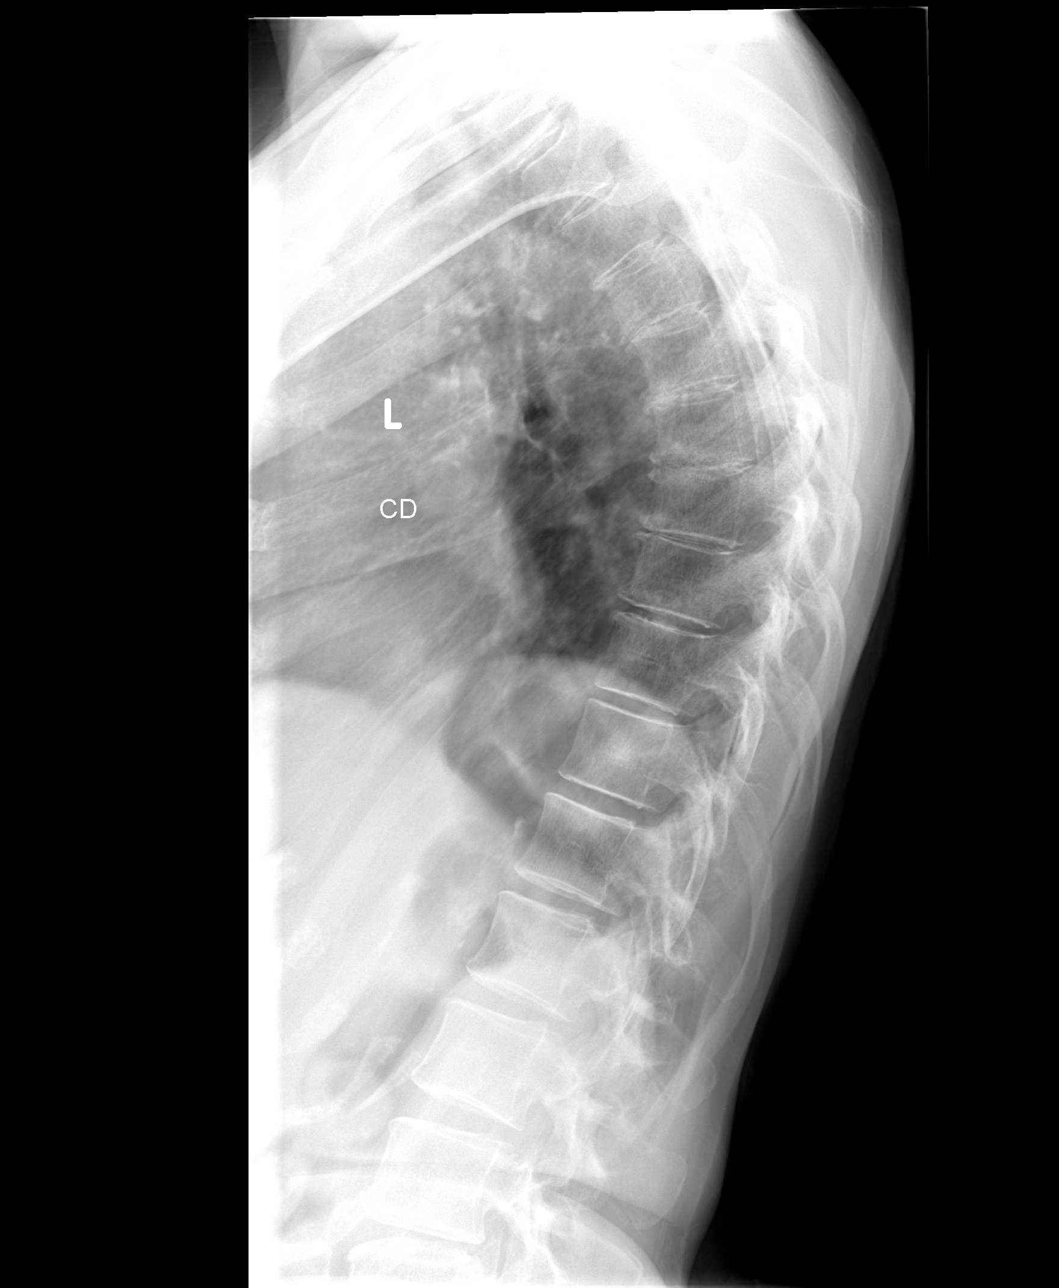

[view not recorded (3 of 3)]
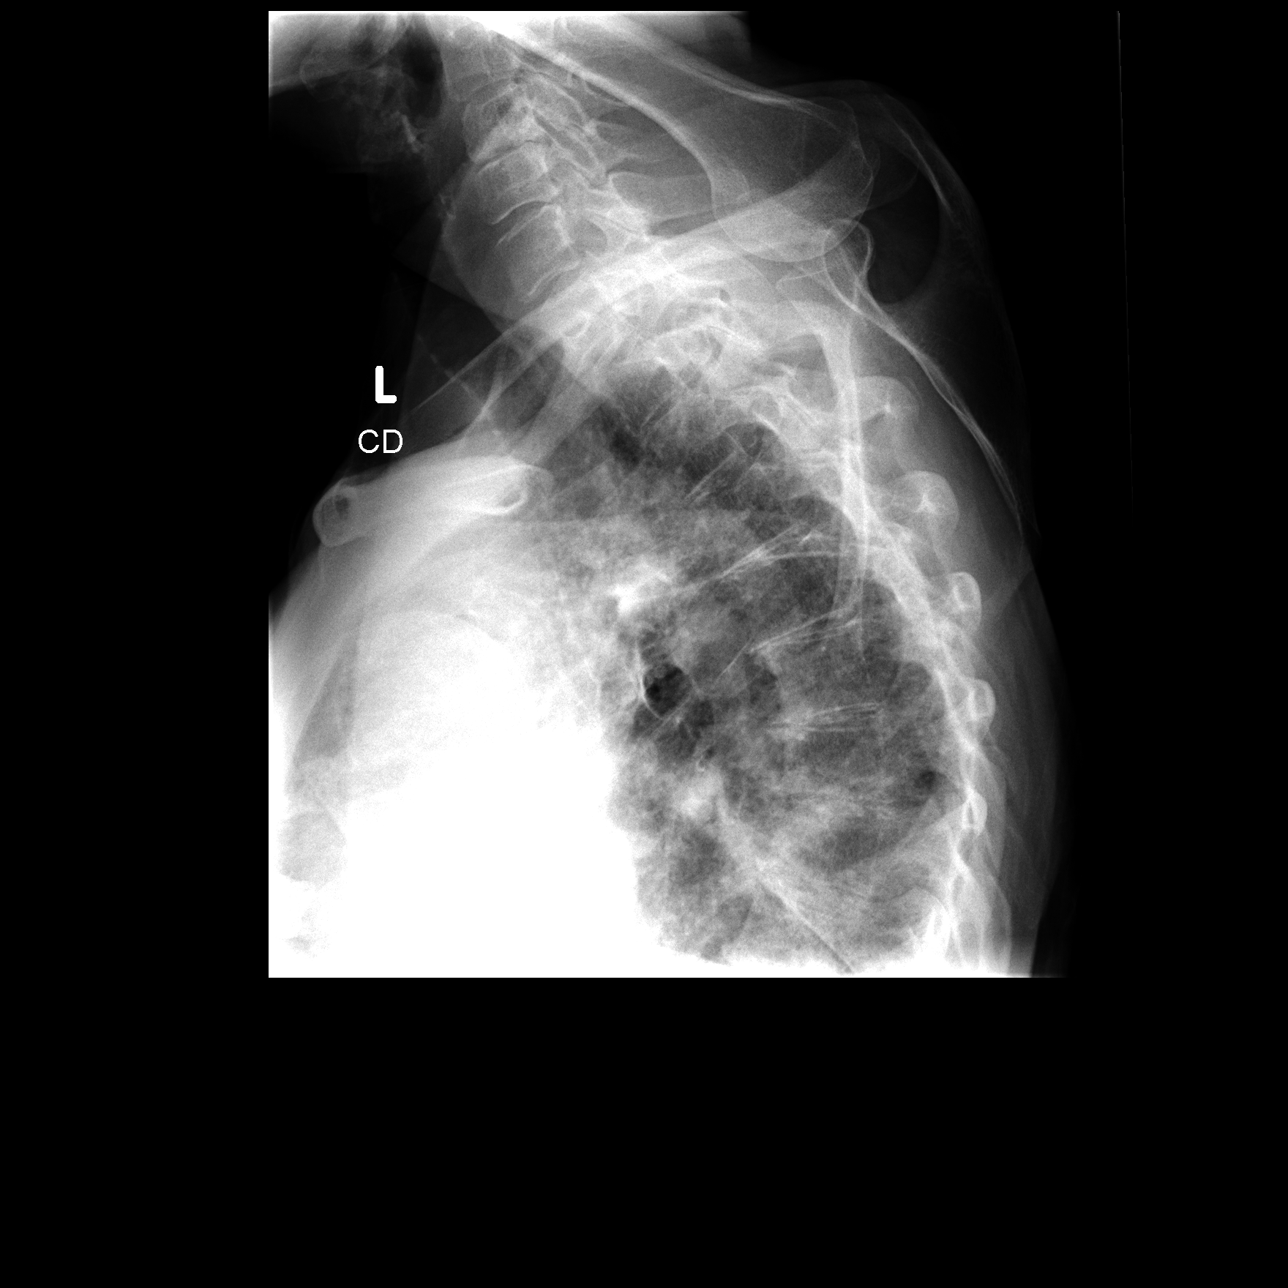

[3 of 3 positions shown; findings below may reference images not displayed]

FINDINGS: There are mild degenerative changes in the thoracic spine
with slight thoracic kyphosis.  The bones appear somewhat
osteopenic.  However no compression deformity is seen.  There is
degenerative change in the mid cervical spine.
IMPRESSION: Degenerative change.  Kyphosis.  No acute compression deformity.

## 2010-09-23 ENCOUNTER — Encounter: Payer: Self-pay | Admitting: Family Medicine

## 2010-09-26 ENCOUNTER — Other Ambulatory Visit: Payer: Self-pay | Admitting: Family Medicine

## 2010-09-26 DIAGNOSIS — N6320 Unspecified lump in the left breast, unspecified quadrant: Secondary | ICD-10-CM

## 2010-10-10 ENCOUNTER — Other Ambulatory Visit: Payer: Self-pay | Admitting: Family Medicine

## 2010-10-10 ENCOUNTER — Ambulatory Visit
Admission: RE | Admit: 2010-10-10 | Discharge: 2010-10-10 | Disposition: A | Discharge status: Home / Self Care | Payer: Self-pay | Source: Ambulatory Visit | Attending: Family Medicine | Admitting: Family Medicine

## 2010-10-10 DIAGNOSIS — N6320 Unspecified lump in the left breast, unspecified quadrant: Secondary | ICD-10-CM

## 2010-10-10 IMAGING — MG MM DIGITAL DIAGNOSTIC UNILAT L {BCG}
3 series · 3 of 3 positions shown · non-contrast
Comparison: [DATE], [DATE], [DATE] and [DATE]

CLINICAL DATA: Patient presents for a 6-month follow-up diagnostic
left breast mammogram to evaluate a density in the deep third of
the inner mid left breast.

DIGITAL DIAGNOSTIC LEFT MAMMOGRAM WITH CAD AND LEFT BREAST
ULTRASOUND:

[L CC]
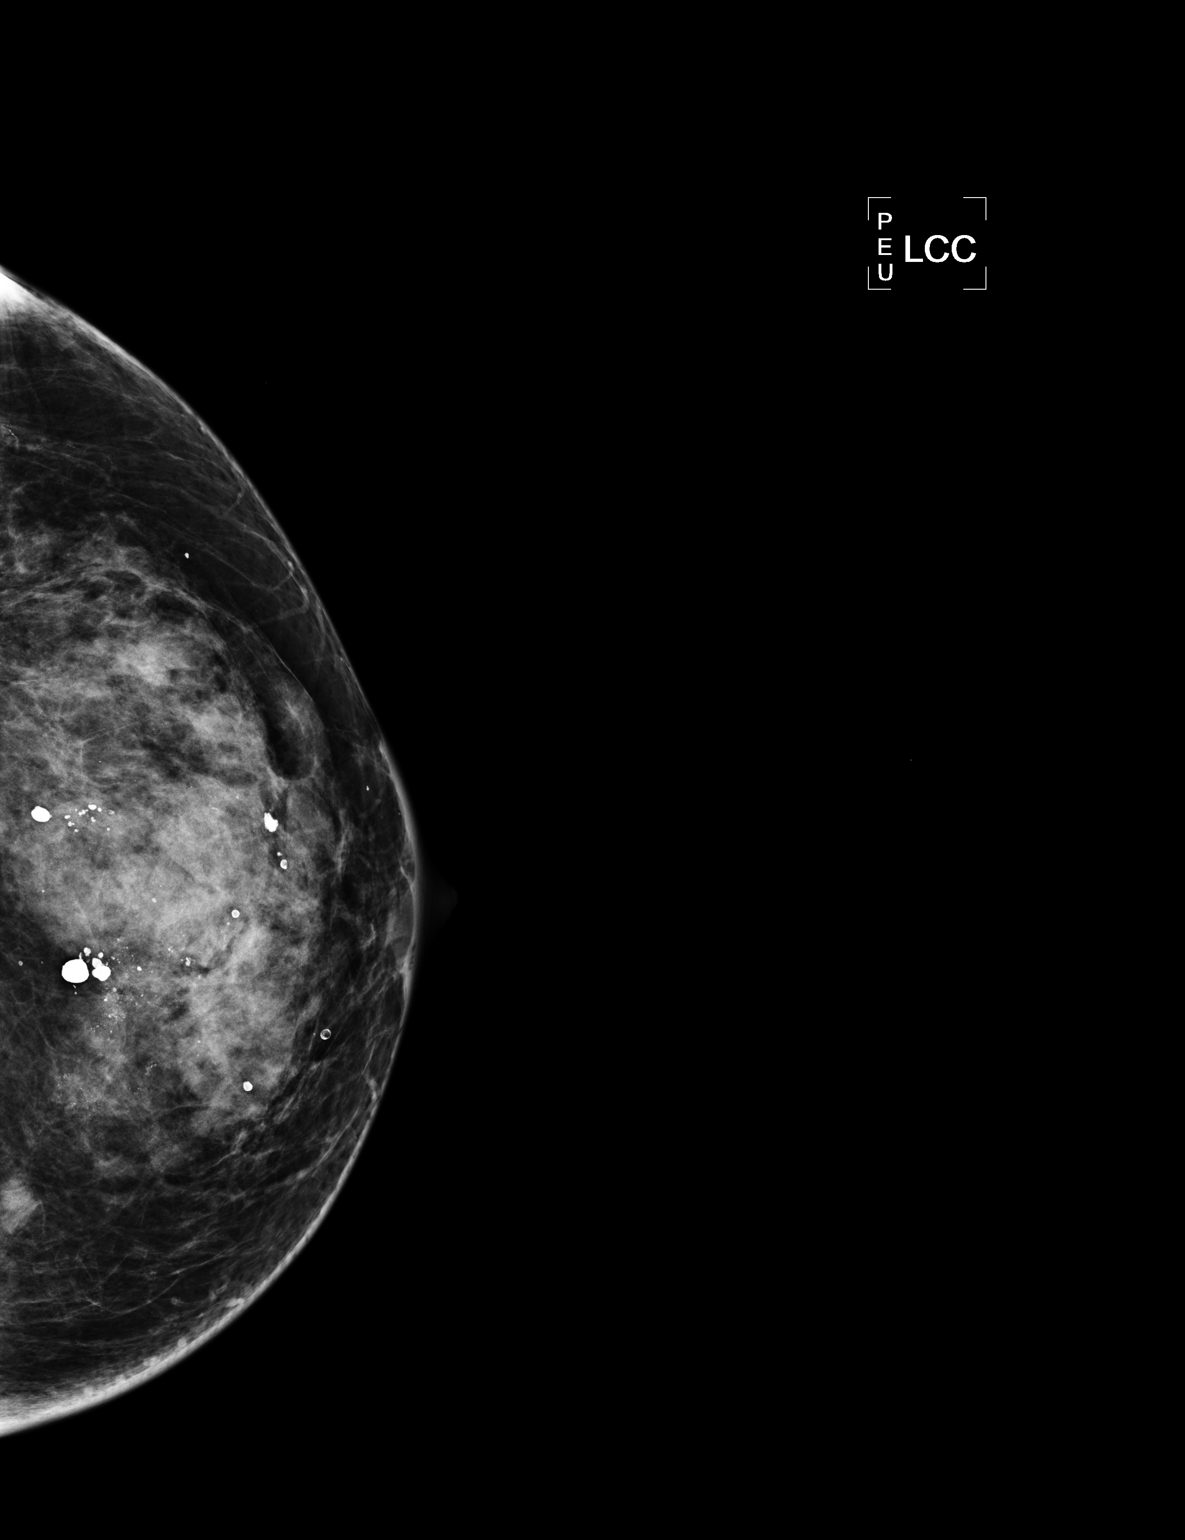

[L MLO]
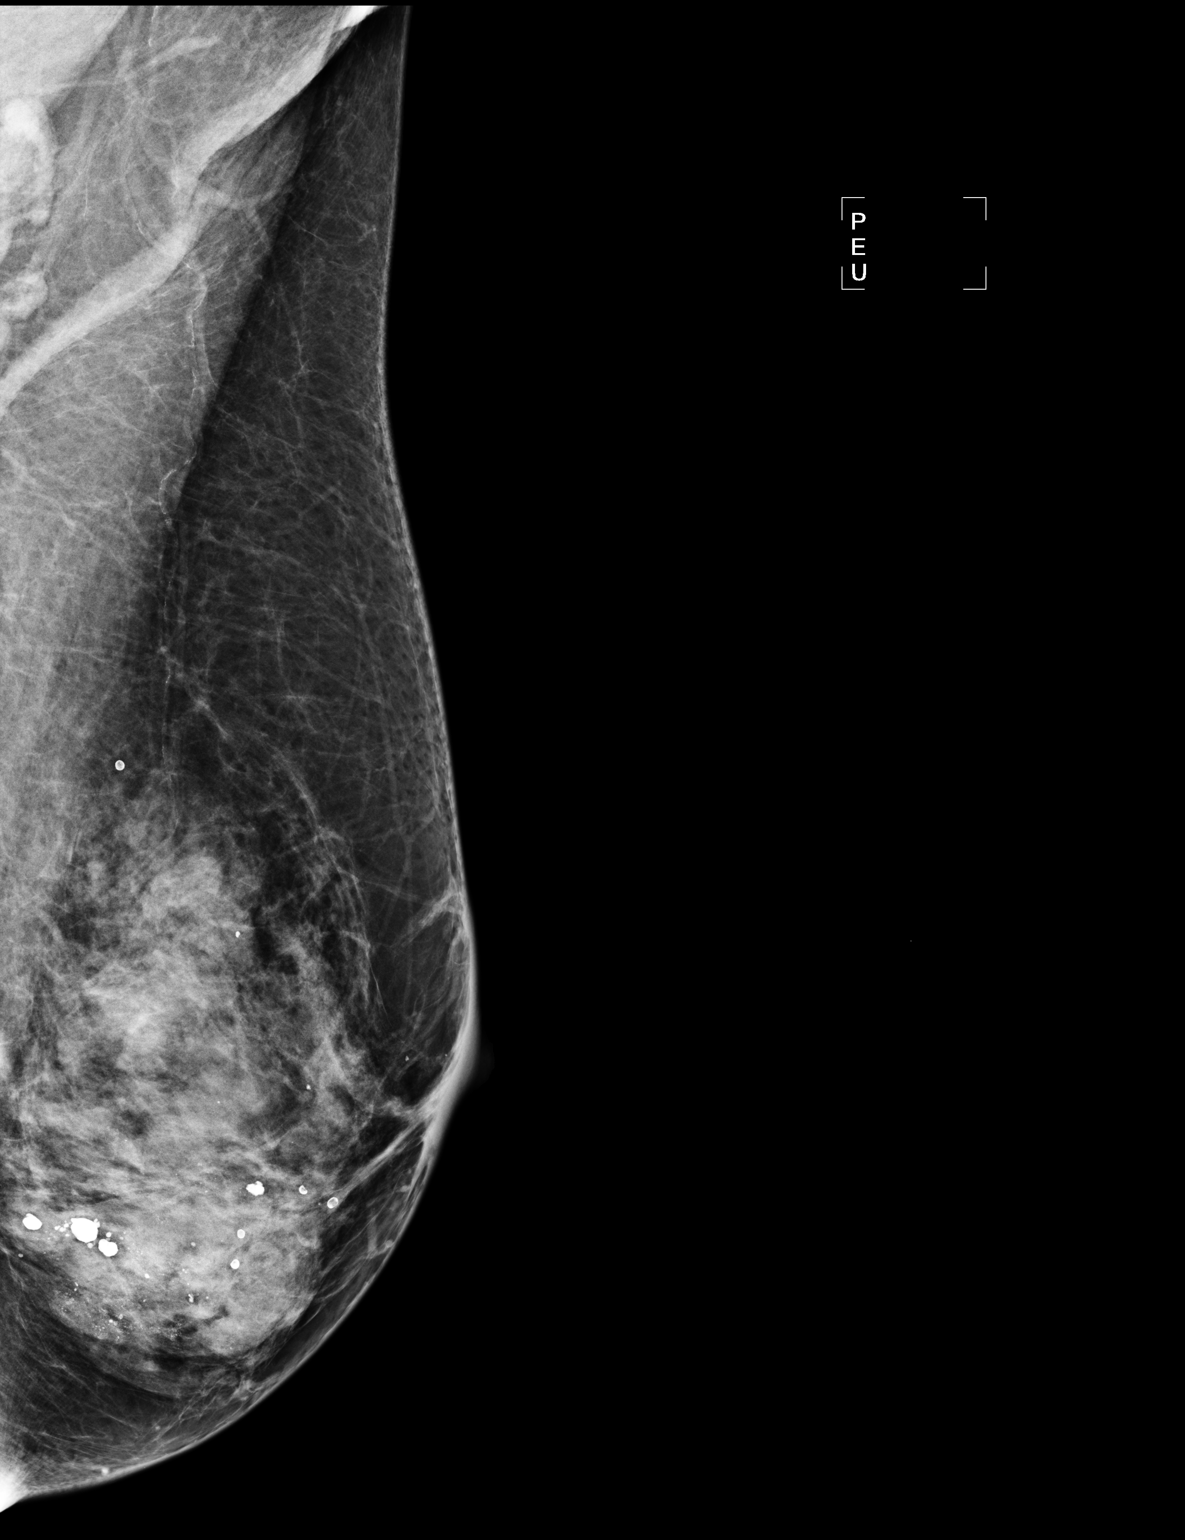

[L ML]
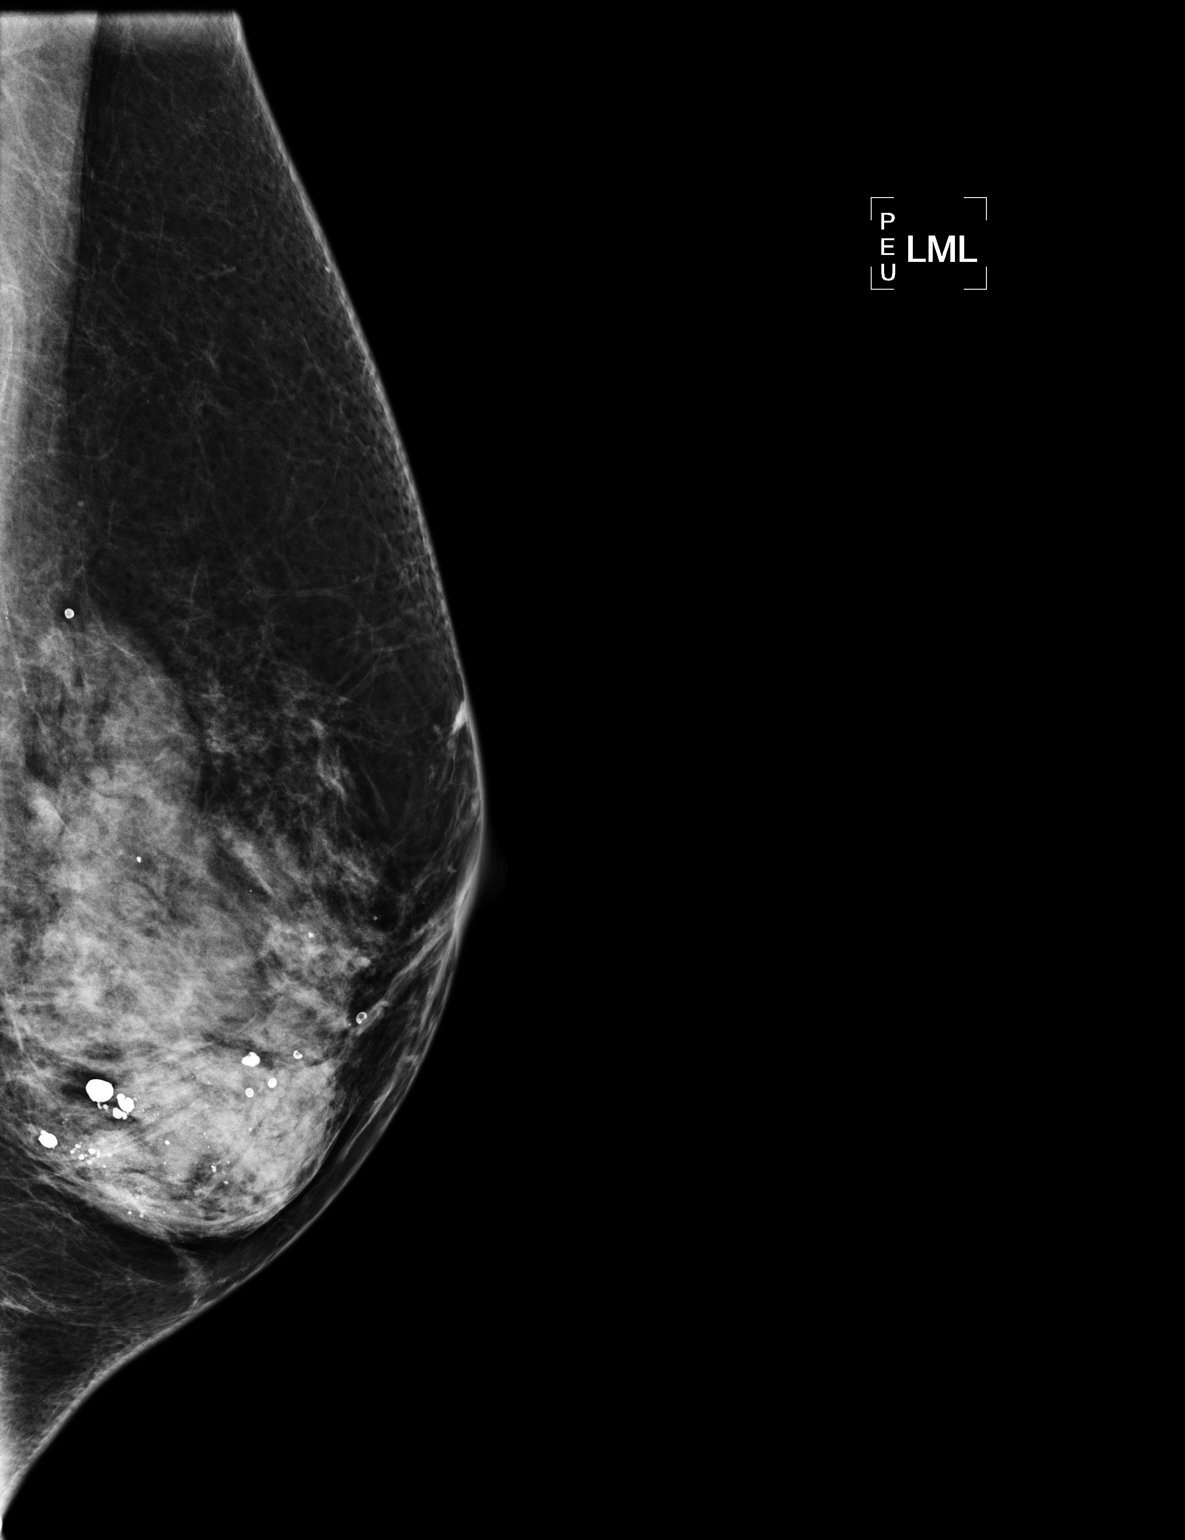

[3 of 3 positions shown; findings below may reference images not displayed]

FINDINGS: Exam demonstrates heterogeneously dense fibroglandular
tissue.  There is no significant change in a small density of the
deep third of the inner mid left breast.  Remainder of the exam is
unchanged.
Mammographic images were processed with CAD.

Ultrasound is performed, showing no focal abnormality over the
inner mid left breast.
IMPRESSION: Stable small density of the deep third of the inner mid left
breast.

Recommendations:  Recommend an additional 6-month follow-up
diagnostic evaluation of the left breast at the time of patient's
annual exam in [DATE].

BI-RADS CATEGORY 3:  Probably benign finding(s) - short interval
follow-up suggested.

## 2011-10-04 ENCOUNTER — Other Ambulatory Visit: Payer: Self-pay | Admitting: Family Medicine

## 2011-10-04 DIAGNOSIS — R922 Inconclusive mammogram: Secondary | ICD-10-CM

## 2011-10-10 ENCOUNTER — Ambulatory Visit
Admission: RE | Admit: 2011-10-10 | Discharge: 2011-10-10 | Disposition: A | Discharge status: Home / Self Care | Payer: Medicare Other | Source: Ambulatory Visit | Attending: Family Medicine | Admitting: Family Medicine

## 2011-10-10 ENCOUNTER — Other Ambulatory Visit: Payer: Self-pay | Admitting: Family Medicine

## 2011-10-10 DIAGNOSIS — R922 Inconclusive mammogram: Secondary | ICD-10-CM

## 2011-10-10 DIAGNOSIS — R9230 Dense breasts, unspecified: Secondary | ICD-10-CM

## 2011-10-11 IMAGING — MG MM DIGITAL DIAGNOSTIC BILAT
4 series · 4 of 4 positions shown · non-contrast
Comparison: [DATE], [DATE], [DATE], dating back to
[DATE].

CLINICAL DATA: History of bilateral breast implant removal,
reevaluation of asymmetric density in the medial left breast.
Annual reevaluation, right breast.

DIGITAL DIAGNOSTIC BILATERAL MAMMOGRAM WITH CAD AND LEFT BREAST
ULTRASOUND:

[R CC]
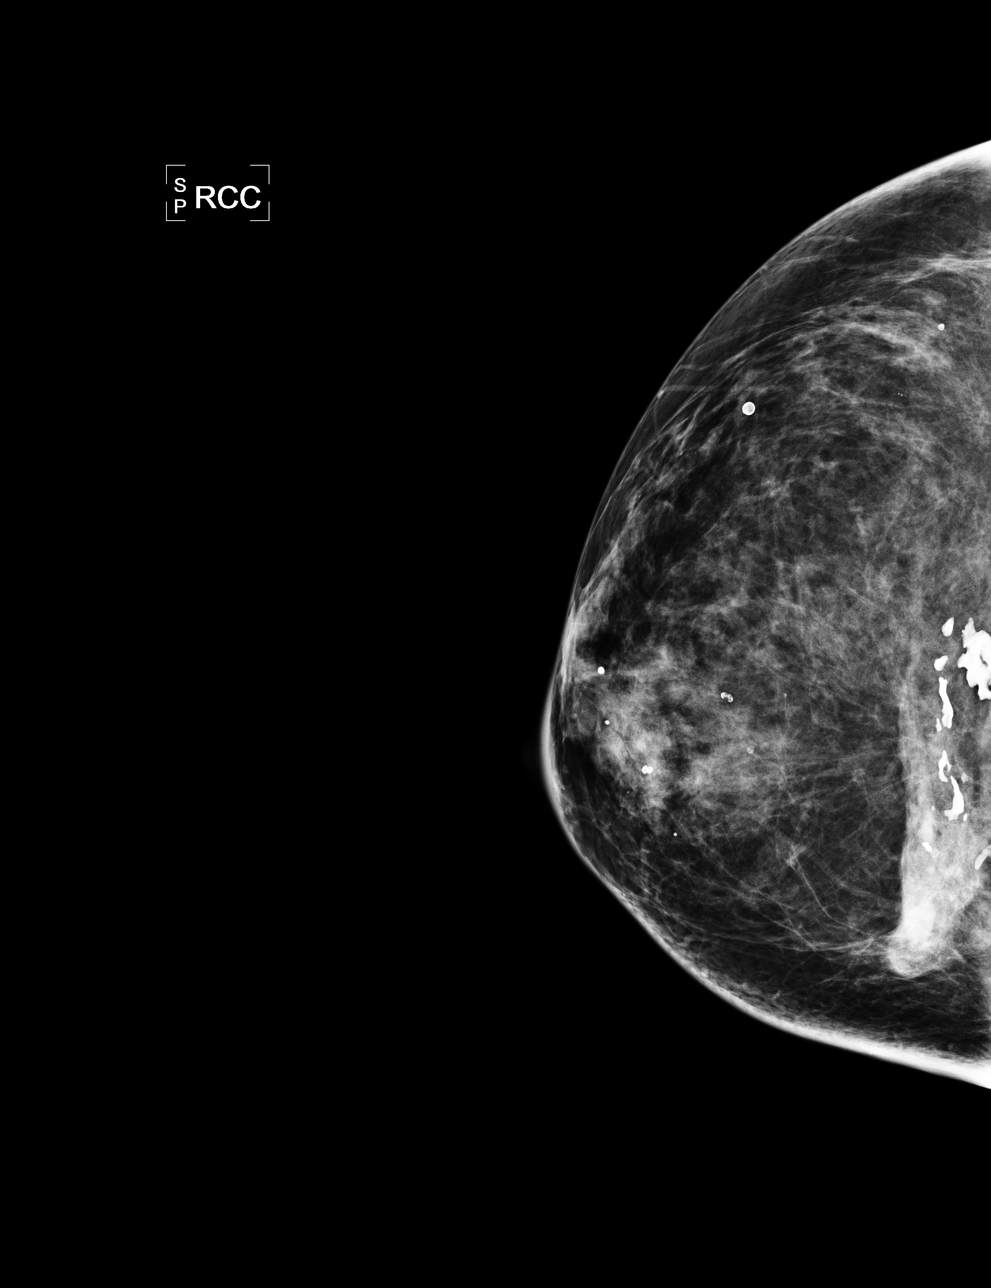

[L CC]
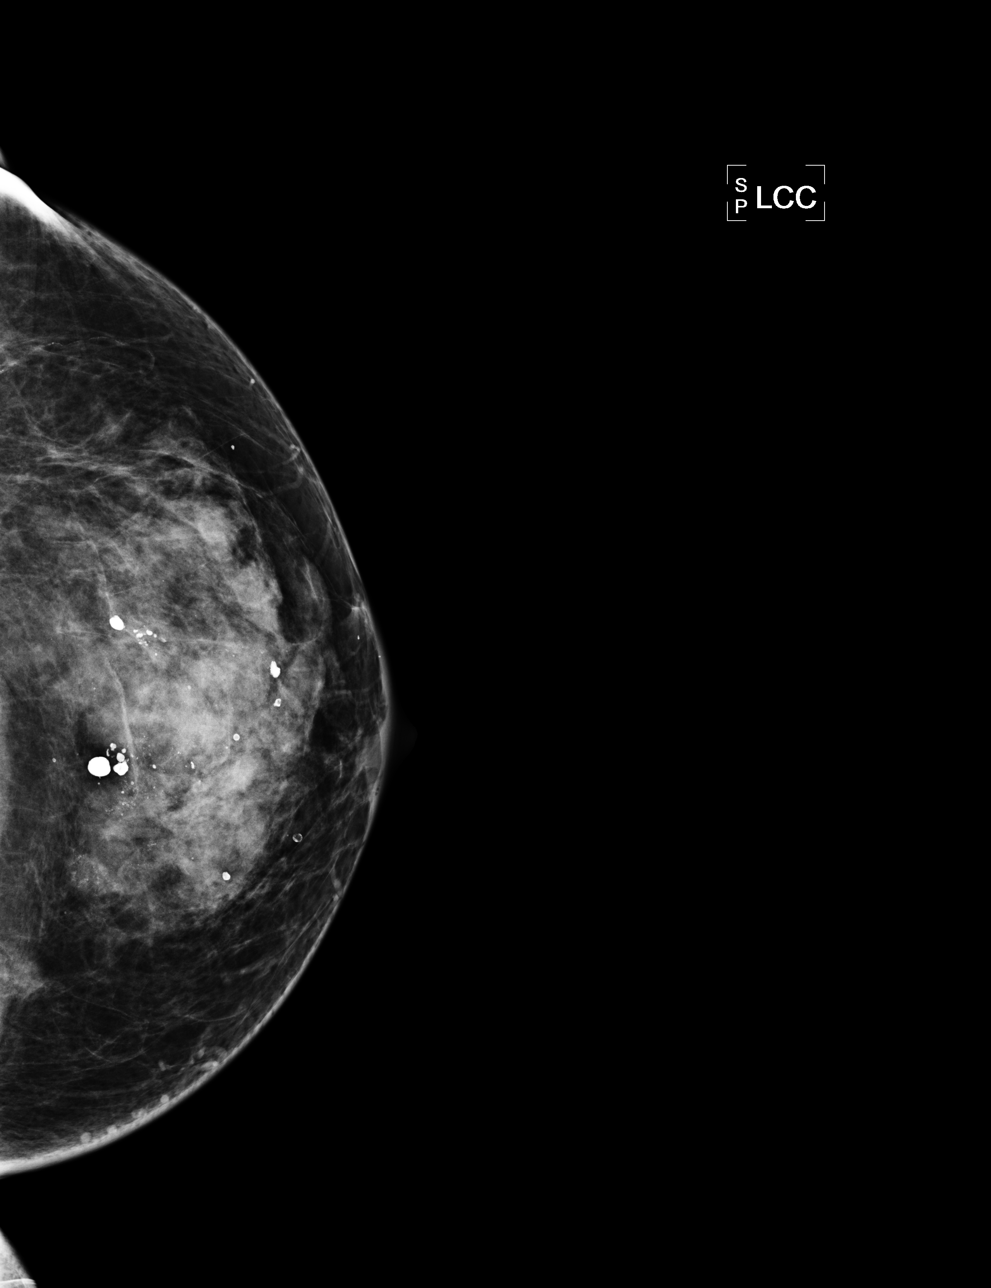

[L MLO]
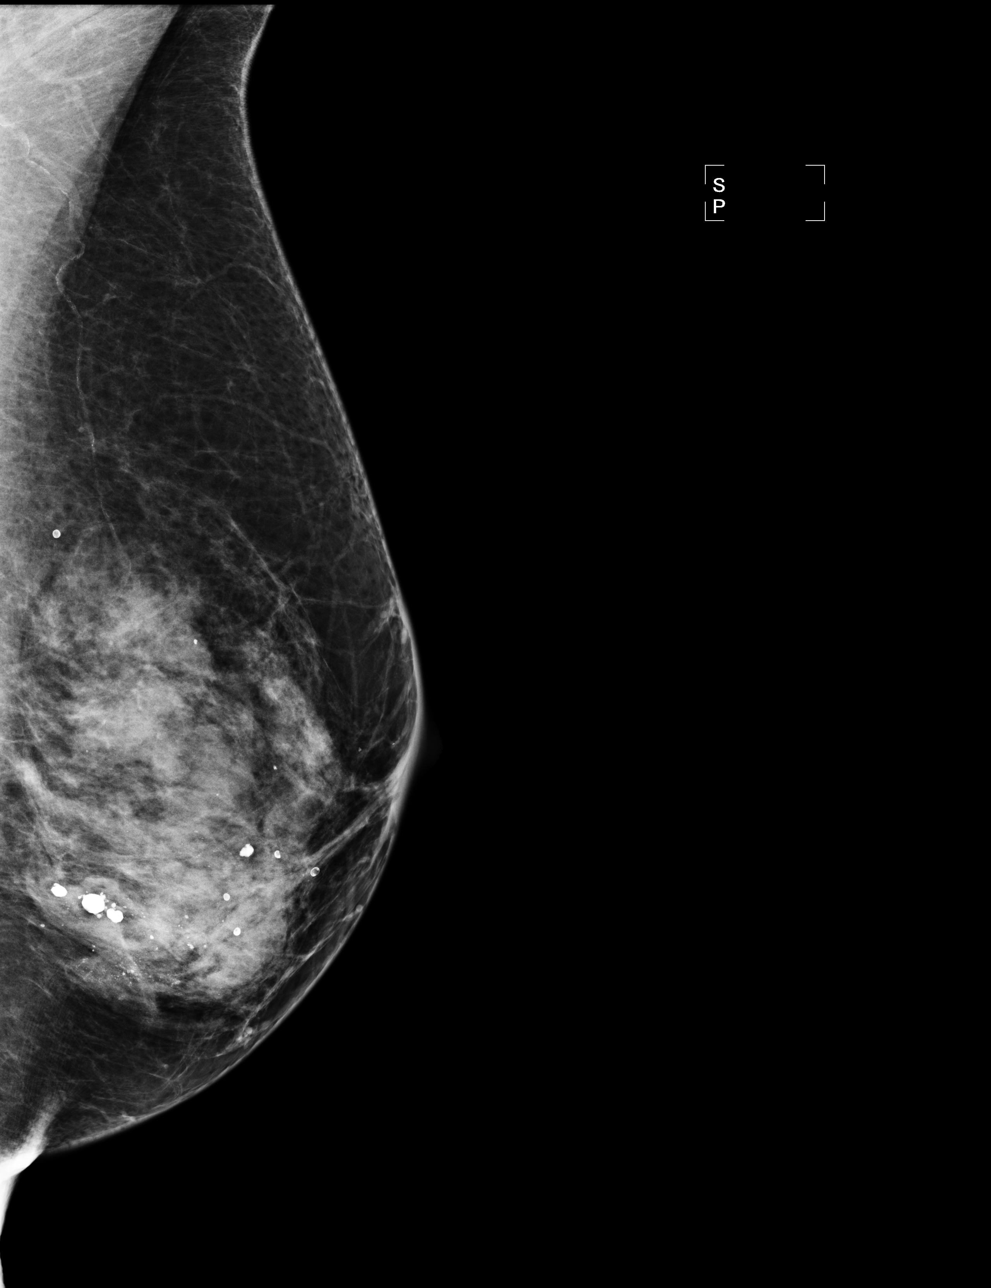

[R MLO]
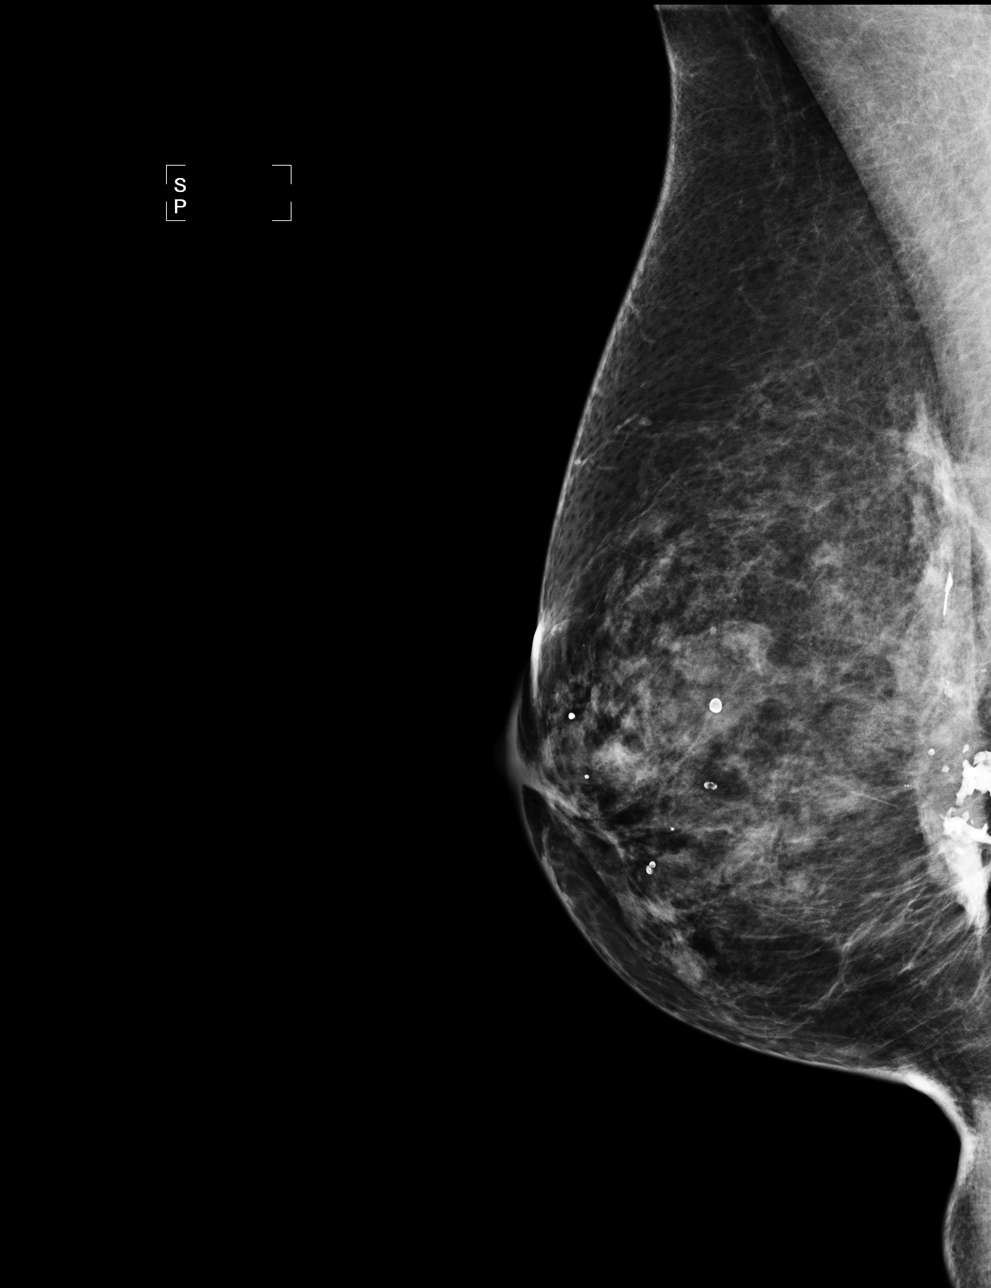

[4 of 4 positions shown; findings below may reference images not displayed]

FINDINGS: CC and MLO views of both breasts were obtained.
Heterogeneously dense fibroglandular parenchymal pattern.  Free
silicone is again demonstrated within both breasts, unchanged.  No
new suspicious mass, nonsurgical architectural distortion, or
suspicious calcifications in either breast.   The asymmetric
density in the medial left breast is unchanged.
Mammographic images were processed with CAD.

On physical exam, I palpate no mass in the medial left breast.

Ultrasound is performed, showing normal fibroglandular tissue in
the medial left breast.  No cyst, solid mass, or abnormal acoustic
shadowing was identified.
IMPRESSION: 1.  No mammographic or sonographic evidence of malignancy, left
breast.
2.  No mammographic evidence of malignancy, right breast.

Recommendation:  Annual screening mammography in 1 year, DREY,

BI-RADS CATEGORY 2:  Benign finding(s).

## 2012-07-16 ENCOUNTER — Other Ambulatory Visit: Payer: Self-pay | Admitting: Family Medicine

## 2012-07-16 DIAGNOSIS — M810 Age-related osteoporosis without current pathological fracture: Secondary | ICD-10-CM

## 2012-08-06 ENCOUNTER — Ambulatory Visit
Admission: RE | Admit: 2012-08-06 | Discharge: 2012-08-06 | Disposition: A | Discharge status: Home / Self Care | Payer: Medicare Other | Source: Ambulatory Visit | Attending: Family Medicine | Admitting: Family Medicine

## 2012-08-06 DIAGNOSIS — M810 Age-related osteoporosis without current pathological fracture: Secondary | ICD-10-CM

## 2013-10-04 ENCOUNTER — Other Ambulatory Visit: Payer: Self-pay

## 2013-10-04 DIAGNOSIS — Z1231 Encounter for screening mammogram for malignant neoplasm of breast: Secondary | ICD-10-CM

## 2013-10-18 ENCOUNTER — Other Ambulatory Visit: Payer: Self-pay | Admitting: Family Medicine

## 2013-10-18 ENCOUNTER — Ambulatory Visit: Payer: Self-pay

## 2013-10-18 DIAGNOSIS — N63 Unspecified lump in unspecified breast: Secondary | ICD-10-CM

## 2013-10-27 ENCOUNTER — Ambulatory Visit
Admission: RE | Admit: 2013-10-27 | Discharge: 2013-10-27 | Disposition: A | Discharge status: Home / Self Care | Payer: Medicare Other | Source: Ambulatory Visit | Attending: Family Medicine | Admitting: Family Medicine

## 2013-10-27 DIAGNOSIS — N63 Unspecified lump in unspecified breast: Secondary | ICD-10-CM

## 2014-10-25 ENCOUNTER — Other Ambulatory Visit: Payer: Self-pay | Admitting: Family Medicine

## 2014-10-25 DIAGNOSIS — M810 Age-related osteoporosis without current pathological fracture: Secondary | ICD-10-CM

## 2014-10-31 ENCOUNTER — Ambulatory Visit: Payer: Self-pay

## 2014-10-31 ENCOUNTER — Other Ambulatory Visit: Payer: Self-pay

## 2014-11-07 ENCOUNTER — Ambulatory Visit
Admission: RE | Admit: 2014-11-07 | Discharge: 2014-11-07 | Disposition: A | Discharge status: Home / Self Care | Payer: Medicare Other | Source: Ambulatory Visit | Attending: Family Medicine | Admitting: Family Medicine

## 2014-11-07 ENCOUNTER — Ambulatory Visit
Admission: RE | Admit: 2014-11-07 | Discharge: 2014-11-07 | Disposition: A | Discharge status: Home / Self Care | Payer: Medicare Other | Source: Ambulatory Visit

## 2014-11-07 DIAGNOSIS — Z1231 Encounter for screening mammogram for malignant neoplasm of breast: Secondary | ICD-10-CM

## 2014-11-07 DIAGNOSIS — M810 Age-related osteoporosis without current pathological fracture: Secondary | ICD-10-CM

## 2015-12-18 ENCOUNTER — Other Ambulatory Visit: Payer: Self-pay

## 2015-12-18 DIAGNOSIS — Z1231 Encounter for screening mammogram for malignant neoplasm of breast: Secondary | ICD-10-CM

## 2015-12-29 ENCOUNTER — Ambulatory Visit: Payer: Self-pay

## 2016-01-10 ENCOUNTER — Ambulatory Visit
Admission: RE | Admit: 2016-01-10 | Discharge: 2016-01-10 | Disposition: A | Discharge status: Home / Self Care | Payer: Medicare Other | Source: Ambulatory Visit

## 2016-01-10 DIAGNOSIS — Z1231 Encounter for screening mammogram for malignant neoplasm of breast: Secondary | ICD-10-CM

## 2016-09-20 ENCOUNTER — Emergency Department (HOSPITAL_BASED_OUTPATIENT_CLINIC_OR_DEPARTMENT_OTHER)
Admission: EM | Admit: 2016-09-20 | Discharge: 2016-09-20 | Disposition: A | Discharge status: Home / Self Care | Payer: Medicare Other | Attending: Dermatology | Admitting: Dermatology

## 2016-09-20 ENCOUNTER — Encounter (HOSPITAL_BASED_OUTPATIENT_CLINIC_OR_DEPARTMENT_OTHER): Payer: Self-pay | Admitting: Emergency Medicine

## 2016-09-20 DIAGNOSIS — Z5321 Procedure and treatment not carried out due to patient leaving prior to being seen by health care provider: Secondary | ICD-10-CM | POA: Diagnosis not present

## 2016-09-20 DIAGNOSIS — R109 Unspecified abdominal pain: Secondary | ICD-10-CM | POA: Insufficient documentation

## 2016-09-20 HISTORY — DX: Unspecified intestinal obstruction, unspecified as to partial versus complete obstruction: K56.609

## 2016-09-20 LAB — URINALYSIS, MICROSCOPIC (REFLEX)

## 2016-09-20 LAB — URINALYSIS, ROUTINE W REFLEX MICROSCOPIC
Glucose, UA: NEGATIVE mg/dL
Ketones, ur: 40 mg/dL — AB
Nitrite: NEGATIVE
Protein, ur: NEGATIVE mg/dL
Specific Gravity, Urine: 1.021 (ref 1.005–1.030)
pH: 5.5 (ref 5.0–8.0)

## 2016-09-20 NOTE — ED Triage Notes (Signed)
Left sided flank pain radiating to LLQ abdominal pain.  Pt seen at [facility] physicians and sent here.  Pt had labs and Toradol at office.

## 2016-09-20 NOTE — ED Notes (Signed)
Pt states she cannot wait anylonger

## 2016-09-23 ENCOUNTER — Ambulatory Visit
Admission: RE | Admit: 2016-09-23 | Discharge: 2016-09-23 | Disposition: A | Discharge status: Home / Self Care | Payer: Medicare Other | Source: Ambulatory Visit | Attending: Family Medicine | Admitting: Family Medicine

## 2016-09-23 ENCOUNTER — Other Ambulatory Visit: Payer: Self-pay | Admitting: Family Medicine

## 2016-09-23 DIAGNOSIS — R10A0 Flank pain, unspecified side: Secondary | ICD-10-CM

## 2016-09-23 DIAGNOSIS — R109 Unspecified abdominal pain: Secondary | ICD-10-CM

## 2016-09-23 IMAGING — CT CT ABD-PELV W/O CM
2 of 4 series · 13 of 36 positions shown, 19 images · non-contrast
Comparison: Report of CT scan of the abdomen dated [DATE]

CLINICAL DATA: Left flank pain.  Microscopic hematuria.

EXAM:
CT ABDOMEN AND PELVIS WITHOUT CONTRAST
TECHNIQUE: Multidetector CT imaging of the abdomen and pelvis was performed
following the standard protocol without IV contrast.

[Series 601: coronal body · coronal · 0.78mm/px · 1 of 101 slices shown, 2 images]
[im 34/101  soft-tissue]
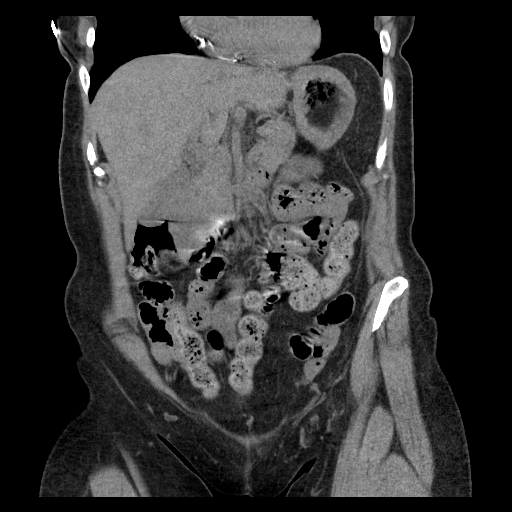
[im 34/101  bone]
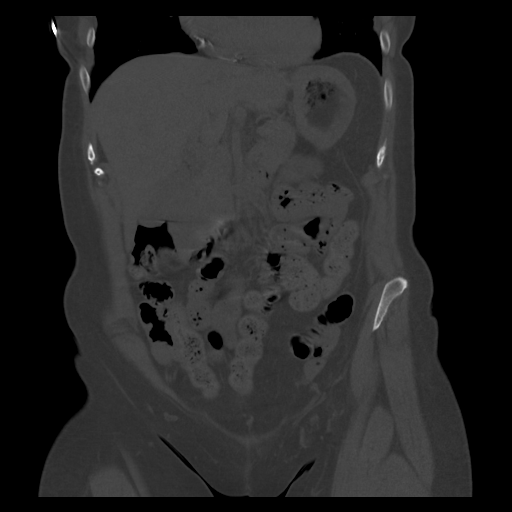

[Series 602: sagittal body · sagittal · 0.78mm/px · 12 of 158 slices shown, 17 images]
[im 9/158  soft-tissue]
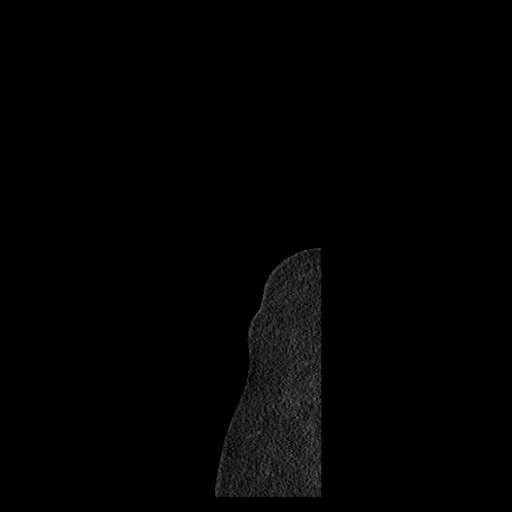
[im 9/158  lung]
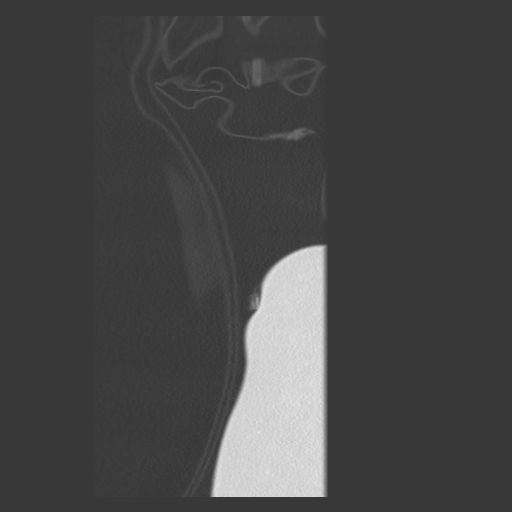
[im 9/158  bone]
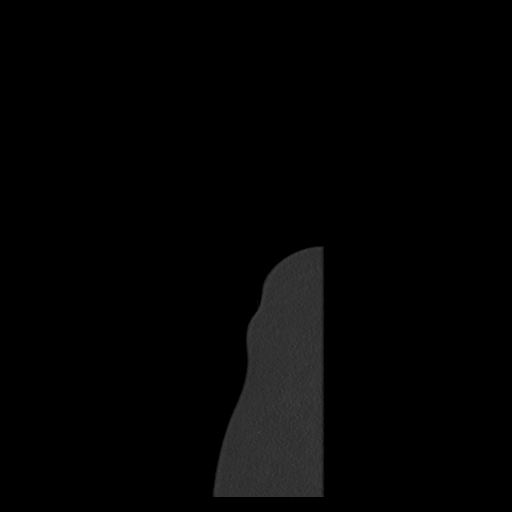
[im 18/158  lung]
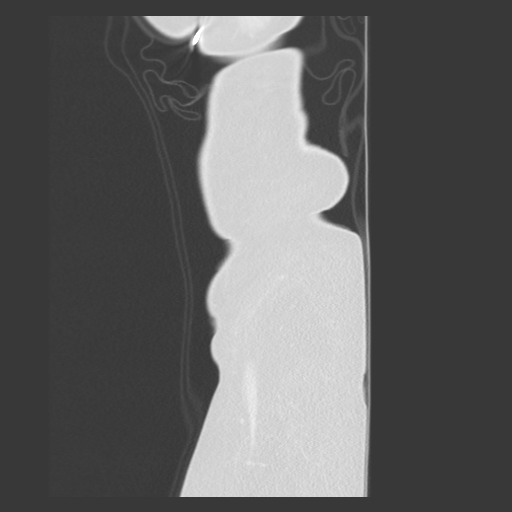
[im 27/158  soft-tissue]
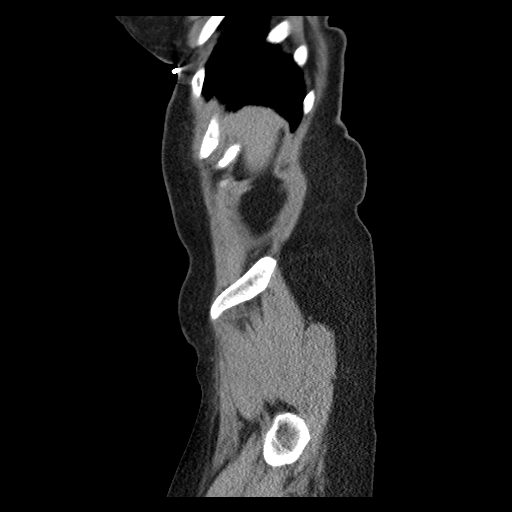
[im 27/158  lung]
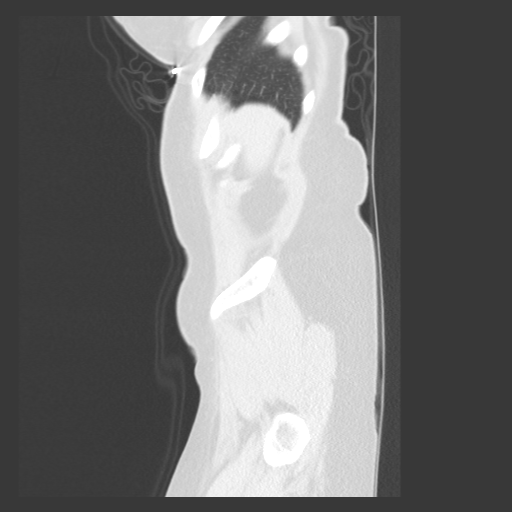
[im 35/158  soft-tissue]
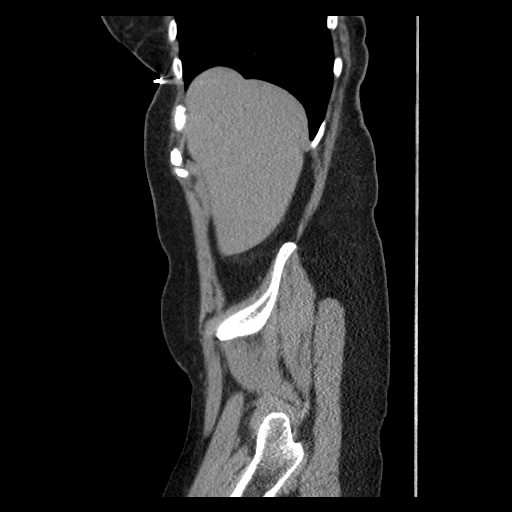
[im 35/158  lung]
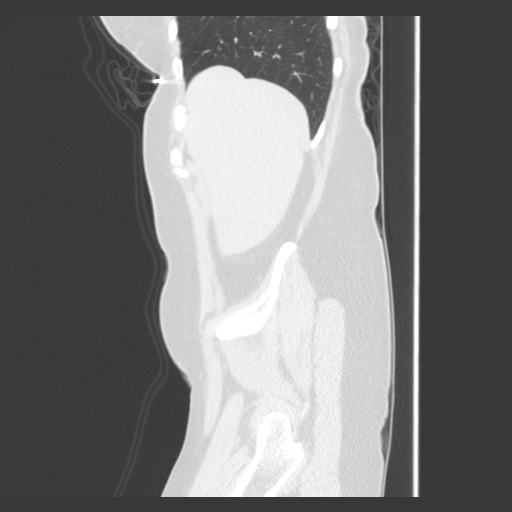
[im 53/158  soft-tissue]
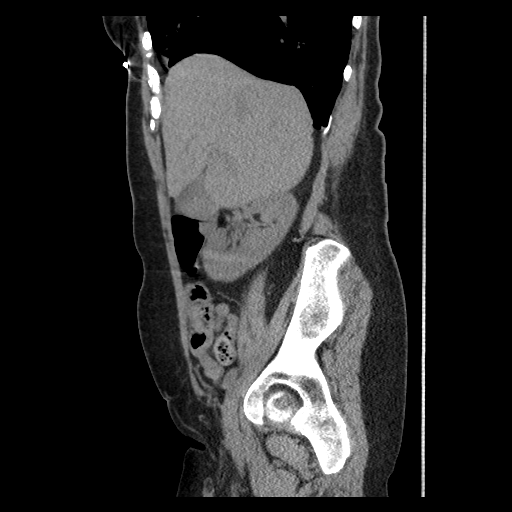
[im 62/158  soft-tissue]
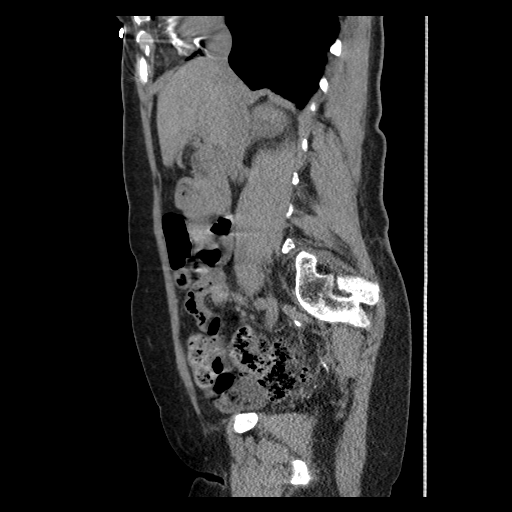
[im 79/158  soft-tissue]
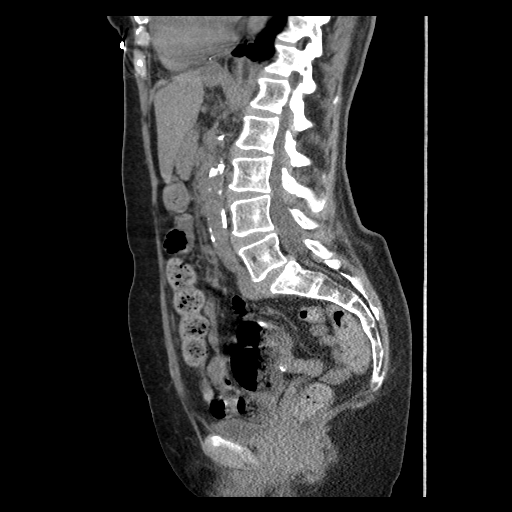
[im 96/158  soft-tissue]
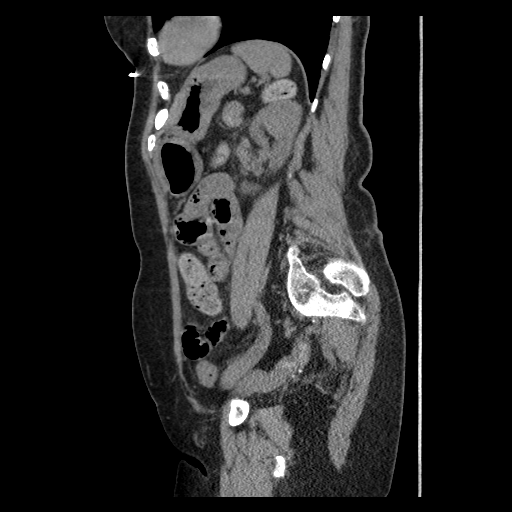
[im 105/158  soft-tissue]
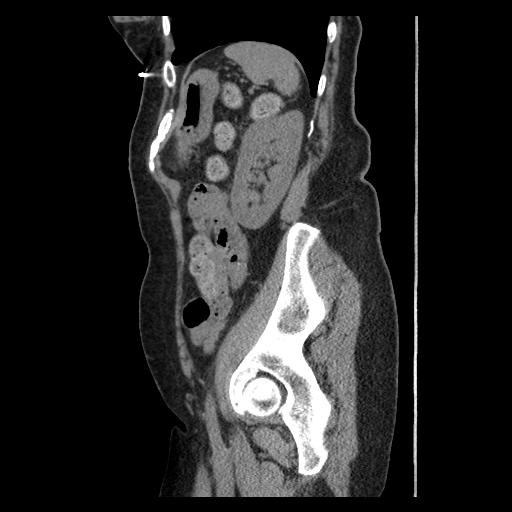
[im 123/158  soft-tissue]
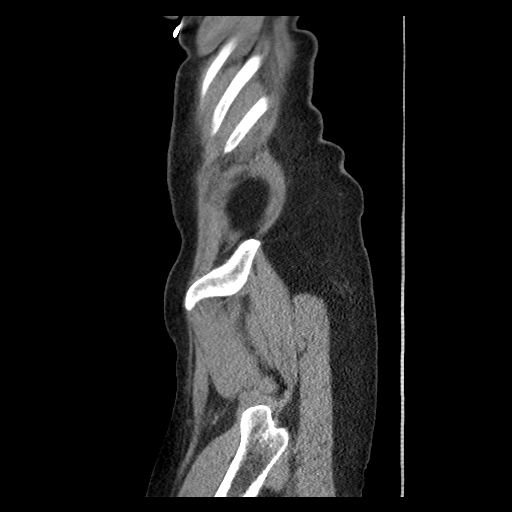
[im 123/158  bone]
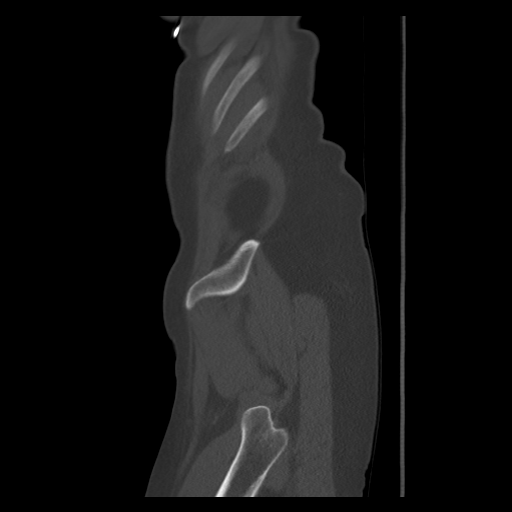
[im 131/158  soft-tissue]
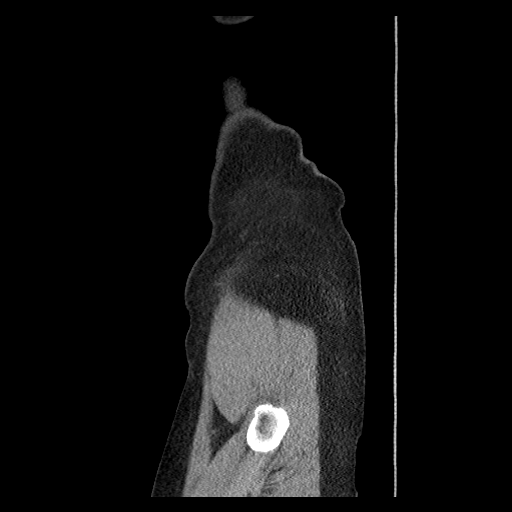
[im 149/158  soft-tissue]
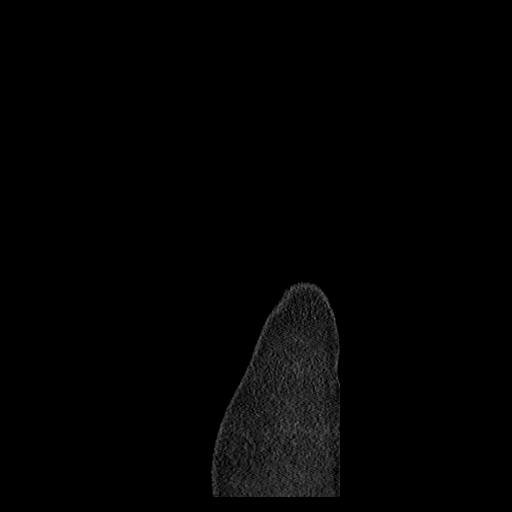

[13 of 36 positions shown; findings below may reference images not displayed]

FINDINGS: Lower chest: Aortic atherosclerosis. Heart size is normal. Slight
scarring in the anterior aspects of the right middle lobe and
lingula.

Hepatobiliary: No focal liver abnormality is seen. No gallstones,
gallbladder wall thickening, or biliary dilatation.

Pancreas: Unremarkable. No pancreatic ductal dilatation or
surrounding inflammatory changes.

Spleen: Normal in size without focal abnormality.

Adrenals/Urinary Tract: Adrenal glands are unremarkable. Kidneys are
normal, without renal calculi, focal lesion, or hydronephrosis.
Bladder is unremarkable.

Stomach/Bowel: Evidence of previous bowel surgery in the mid pelvis.
Slightly dilated loop of small bowel in the mid pelvis proximal and
distal to the anastomosis. Prior appendectomy. No acute
abnormalities.

Vascular/Lymphatic: Aortic atherosclerosis.  No adenopathy.

Reproductive: Uterus and ovaries appear to have been removed.

Other: No abdominal wall hernia or abnormality. No abdominopelvic
ascites.

Musculoskeletal: No acute or significant osseous findings.
IMPRESSION: Single slightly dilated loop of small bowel in the mid pelvis just
proximal and distal to a bowel anastomosis. Otherwise, benign
appearing abdomen and pelvis. I doubt this finding is significant.

Aortic atherosclerosis.

## 2016-11-18 ENCOUNTER — Other Ambulatory Visit: Payer: Self-pay | Admitting: Family Medicine

## 2016-11-18 DIAGNOSIS — M810 Age-related osteoporosis without current pathological fracture: Secondary | ICD-10-CM

## 2016-11-18 DIAGNOSIS — Z1231 Encounter for screening mammogram for malignant neoplasm of breast: Secondary | ICD-10-CM

## 2016-12-05 ENCOUNTER — Emergency Department (HOSPITAL_COMMUNITY)
Admission: EM | Admit: 2016-12-05 | Discharge: 2016-12-05 | Disposition: A | Discharge status: Home / Self Care | Payer: Medicare Other | Attending: Emergency Medicine | Admitting: Emergency Medicine

## 2016-12-05 ENCOUNTER — Ambulatory Visit: Payer: Medicare Other | Attending: Family Medicine | Admitting: Rehabilitative and Restorative Service Providers"

## 2016-12-05 ENCOUNTER — Encounter (HOSPITAL_COMMUNITY): Payer: Self-pay

## 2016-12-05 ENCOUNTER — Emergency Department (HOSPITAL_COMMUNITY): Payer: Medicare Other

## 2016-12-05 VITALS — BP 215/110 | HR 87

## 2016-12-05 DIAGNOSIS — I10 Essential (primary) hypertension: Secondary | ICD-10-CM | POA: Diagnosis present

## 2016-12-05 DIAGNOSIS — R112 Nausea with vomiting, unspecified: Secondary | ICD-10-CM | POA: Diagnosis not present

## 2016-12-05 DIAGNOSIS — Z7982 Long term (current) use of aspirin: Secondary | ICD-10-CM | POA: Diagnosis not present

## 2016-12-05 DIAGNOSIS — R2689 Other abnormalities of gait and mobility: Secondary | ICD-10-CM

## 2016-12-05 DIAGNOSIS — R42 Dizziness and giddiness: Secondary | ICD-10-CM

## 2016-12-05 DIAGNOSIS — Z87891 Personal history of nicotine dependence: Secondary | ICD-10-CM | POA: Insufficient documentation

## 2016-12-05 DIAGNOSIS — H8111 Benign paroxysmal vertigo, right ear: Secondary | ICD-10-CM | POA: Diagnosis present

## 2016-12-05 LAB — CBC
HCT: 40.9 % (ref 36.0–46.0)
Hemoglobin: 13.7 g/dL (ref 12.0–15.0)
MCH: 30.5 pg (ref 26.0–34.0)
MCHC: 33.5 g/dL (ref 30.0–36.0)
MCV: 91.1 fL (ref 78.0–100.0)
Platelets: 262 10*3/uL (ref 150–400)
RBC: 4.49 MIL/uL (ref 3.87–5.11)
RDW: 13.5 % (ref 11.5–15.5)
WBC: 12.2 10*3/uL — ABNORMAL HIGH (ref 4.0–10.5)

## 2016-12-05 LAB — BASIC METABOLIC PANEL
Anion gap: 9 (ref 5–15)
BUN: 15 mg/dL (ref 6–20)
CO2: 23 mmol/L (ref 22–32)
Calcium: 8.9 mg/dL (ref 8.9–10.3)
Chloride: 103 mmol/L (ref 101–111)
Creatinine, Ser: 0.57 mg/dL (ref 0.44–1.00)
GFR calc Af Amer: >60 mL/min (ref >60)
GFR calc non Af Amer: >60 mL/min (ref >60)
Glucose, Bld: 162 mg/dL — ABNORMAL HIGH (ref 65–99)
Potassium: 5.4 mmol/L — ABNORMAL HIGH (ref 3.5–5.1)
Sodium: 135 mmol/L (ref 135–145)

## 2016-12-05 IMAGING — CT CT HEAD W/O CM
4 series · 17 of 47 positions shown, 19 images · non-contrast
Comparison: None.

CLINICAL DATA: Dizziness with nausea and vomiting

EXAM:
CT HEAD WITHOUT CONTRAST
TECHNIQUE: Contiguous axial images were obtained from the base of the skull
through the vertex without intravenous contrast.

[Series 3: head without · axial · non-contrast · 0.42mm/px · z∈[+1103,+1228]mm · 7 of 35 slices shown, 9 images]
[im 5/35  brain]
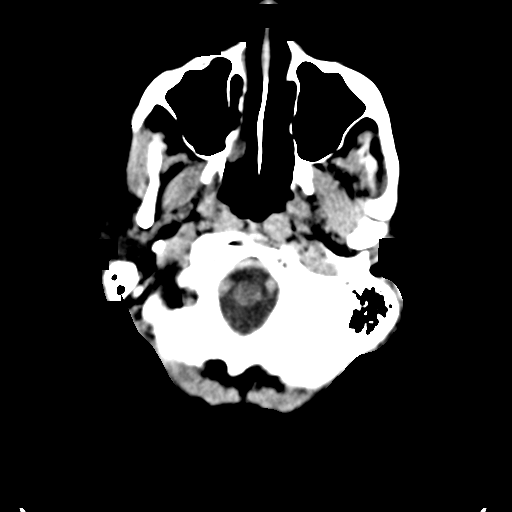
[im 5/35  bone]
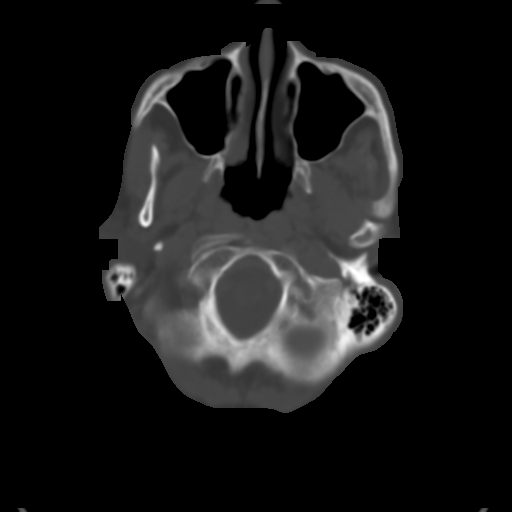
[im 9/35  brain]
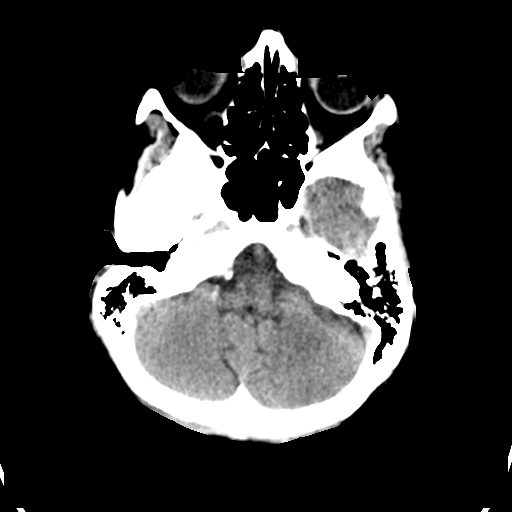
[im 13/35  brain]
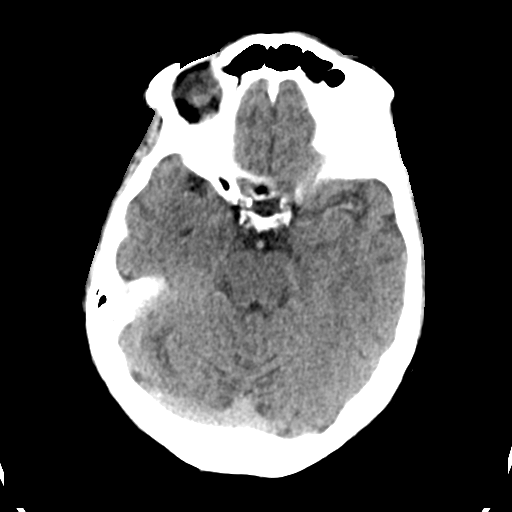
[im 18/35  brain]
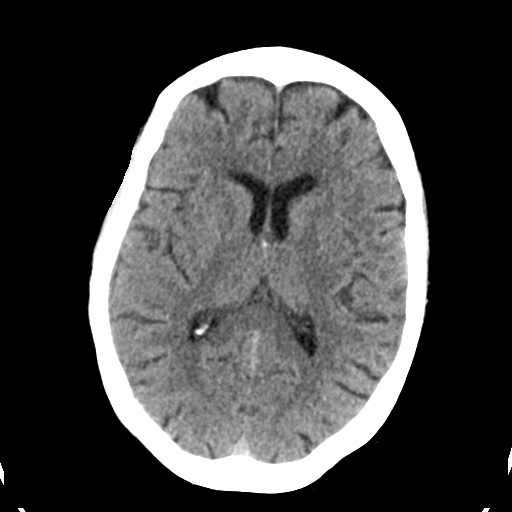
[im 22/35  brain]
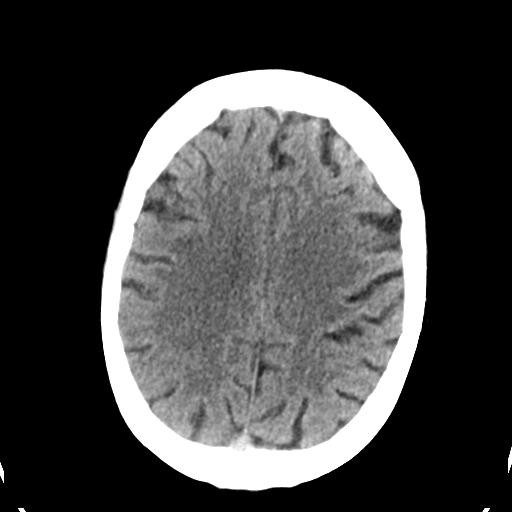
[im 22/35  bone]
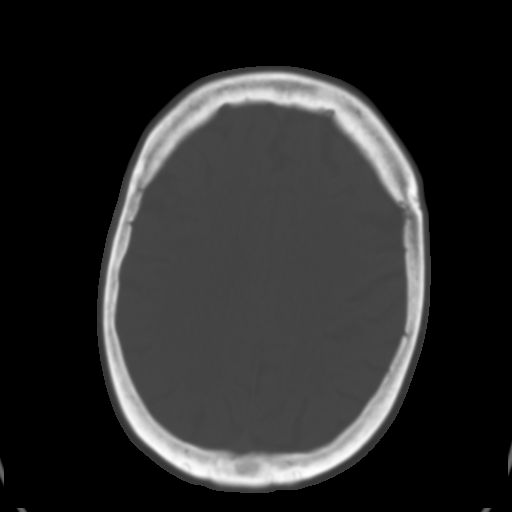
[im 26/35  brain]
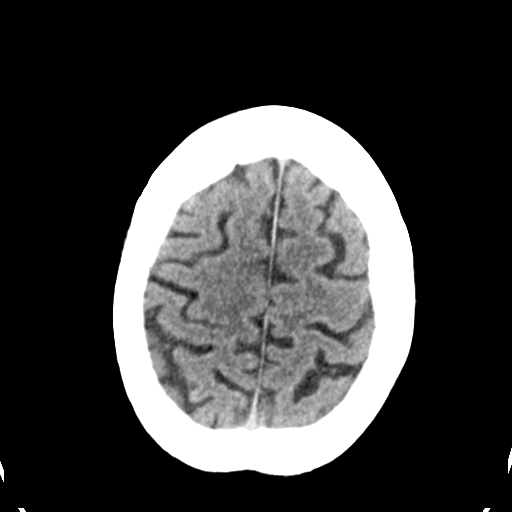
[im 30/35  brain]
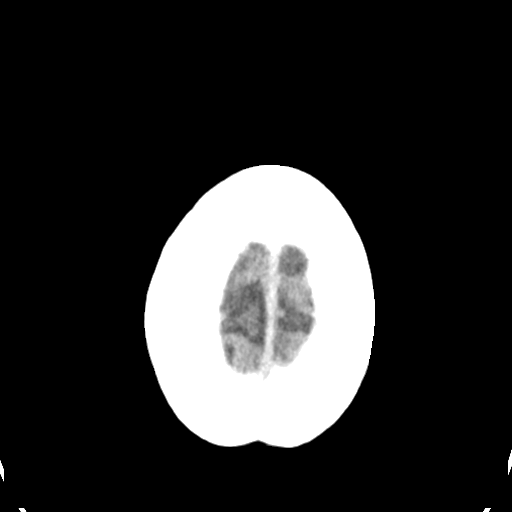

[Series 4: head bone · axial · 0.42mm/px · z∈[+1099,+1159]mm · 4 of 87 slices shown]
[im 9/87  bone]
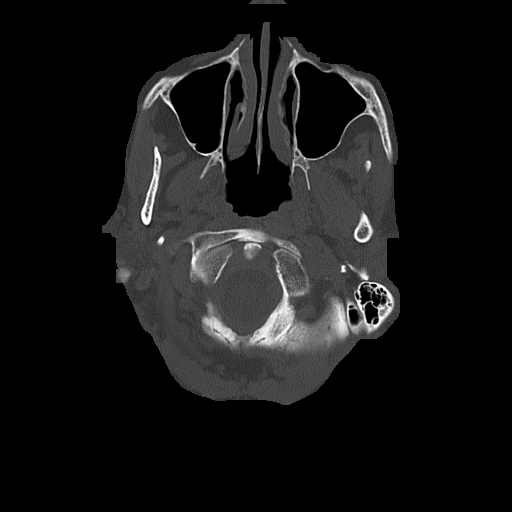
[im 18/87  bone]
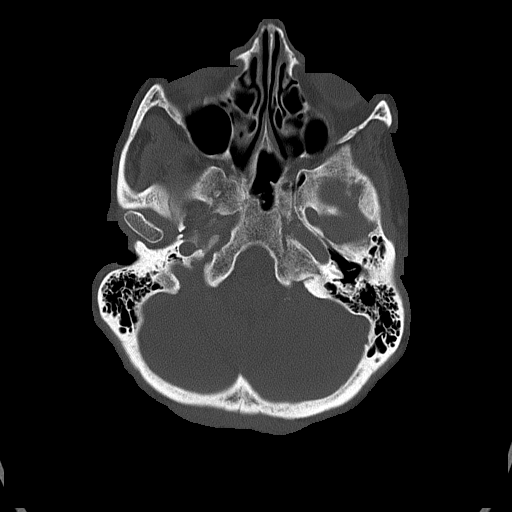
[im 26/87  bone]
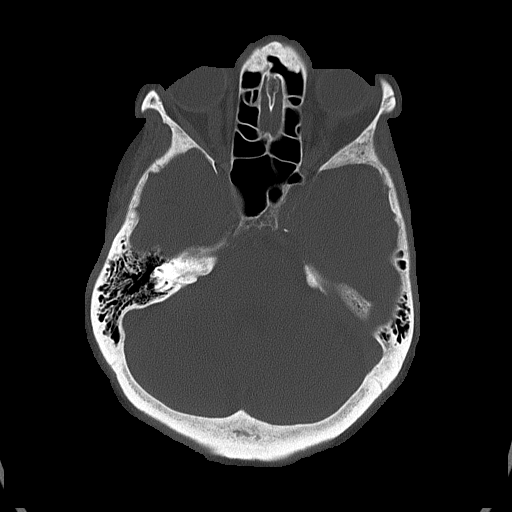
[im 39/87  bone]
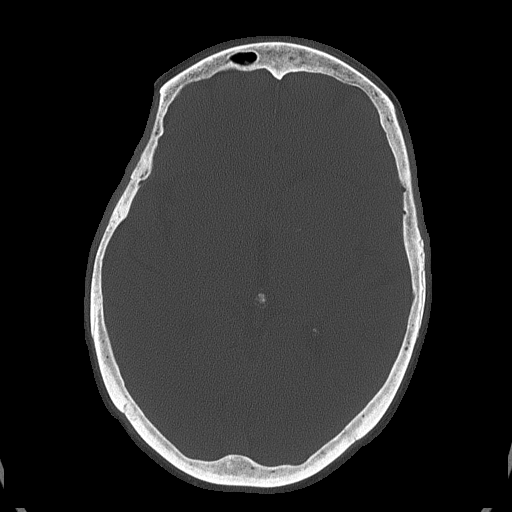

[Series 5: head without cor · coronal · non-contrast · 0.33mm/px · 3 of 68 slices shown]
[im 23/68  brain]
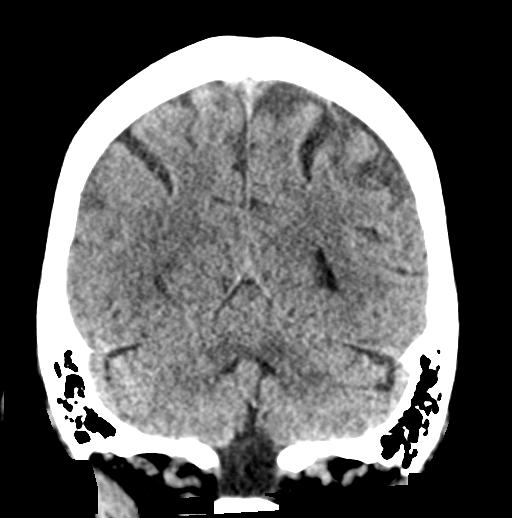
[im 30/68  brain]
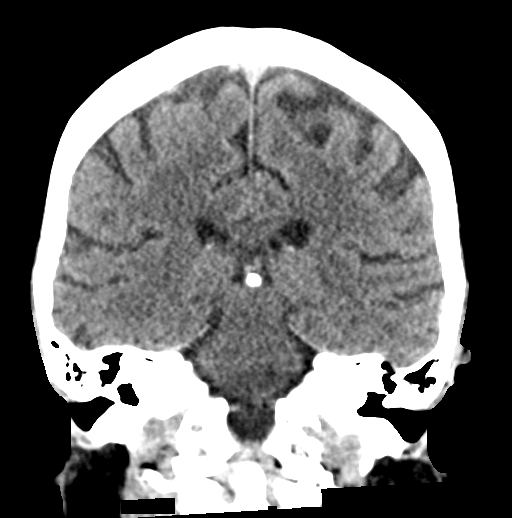
[im 38/68  brain]
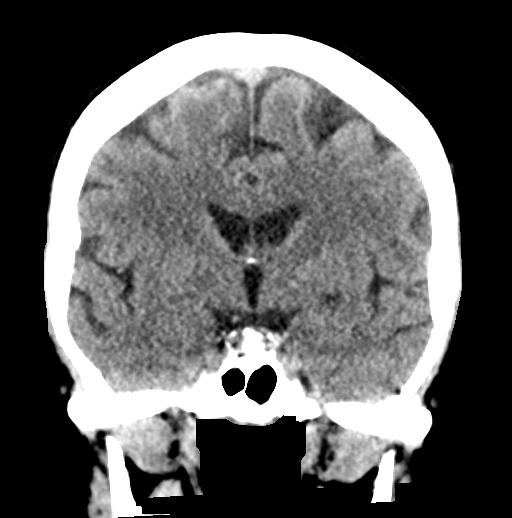

[Series 6: head without sag · sagittal · non-contrast · 0.34mm/px · 3 of 56 slices shown]
[im 19/56  brain]
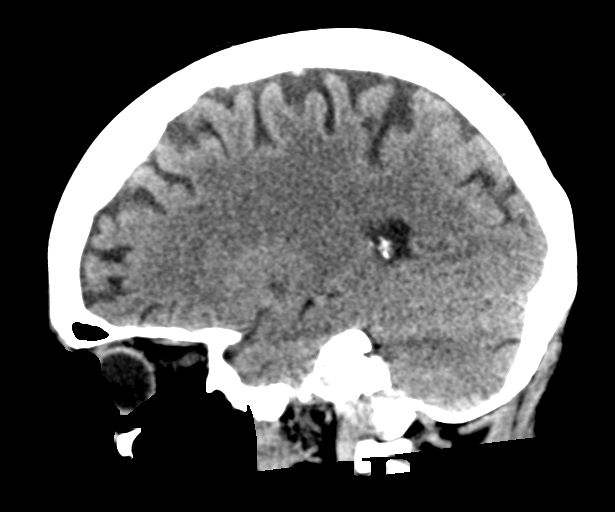
[im 28/56  brain]
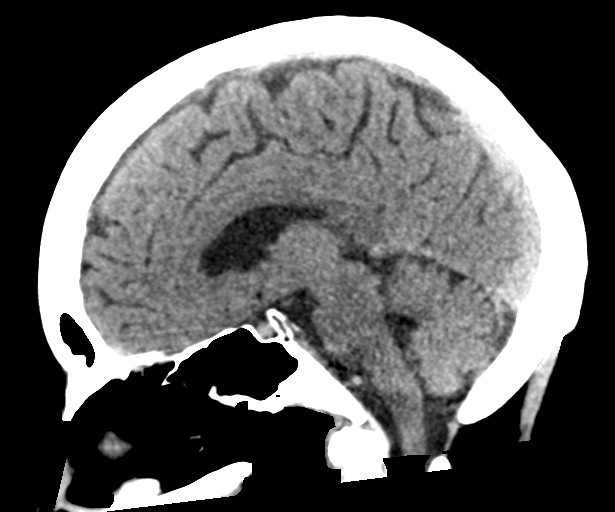
[im 37/56  brain]
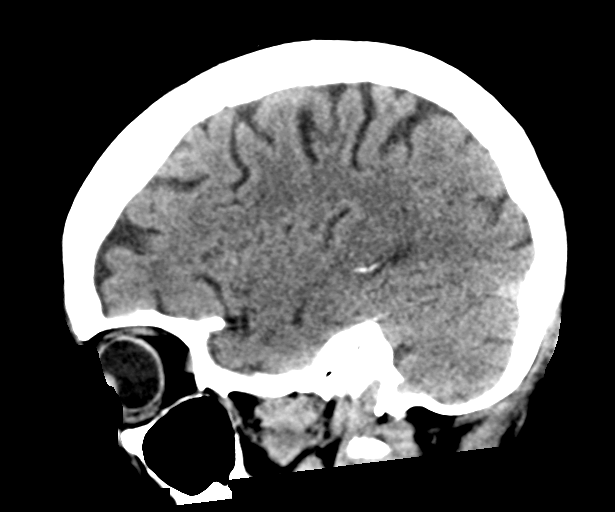

[17 of 47 positions shown; findings below may reference images not displayed]

FINDINGS: Brain: There is age related volume loss. There is no intracranial
mass, hemorrhage, extra-axial fluid collection, or midline shift.
There is slight small vessel disease in the centra semiovale
bilaterally. Elsewhere, gray-white compartments appear normal. There
is no evident acute infarct.

Vascular: No hyperdense vessels. There is calcification in each
carotid siphon region.

Skull: Bony calvarium appears intact.

Sinuses/Orbits: There is slight mucosal thickening in several
ethmoid air cells. Other visualized paranasal sinuses are clear.
Orbits appear symmetric bilaterally.

Other: Mastoid air cells are clear.
IMPRESSION: Age related volume loss with slight periventricular small vessel
disease. No intracranial mass, hemorrhage, or extra-axial fluid
collection. No acute infarct. Areas of arterial vascular
calcification noted. Slight ethmoid sinus mucosal thickening.

## 2016-12-05 MED ORDER — DIPHENHYDRAMINE HCL 50 MG/ML IJ SOLN
12.5000 mg | Freq: Once | INTRAMUSCULAR | Status: AC
  Administered 2016-12-05: 12.5 mg via INTRAVENOUS
  Filled 2016-12-05: qty 1

## 2016-12-05 MED ORDER — DIPHENHYDRAMINE HCL 50 MG/ML IJ SOLN: 25.0000 mg | Freq: Once | INTRAMUSCULAR | Status: DC

## 2016-12-05 MED ORDER — PROMETHAZINE HCL 25 MG/ML IJ SOLN
12.5000 mg | Freq: Once | INTRAMUSCULAR | Status: AC
  Administered 2016-12-05: 12.5 mg via INTRAVENOUS
  Filled 2016-12-05: qty 1

## 2016-12-05 NOTE — ED Notes (Signed)
Pt ambulated without difficulty and without assistance. Pt noted to have steady, normal gait and denied any weakness, dizziness, and lightheadedness. Pt c/o headache.

## 2016-12-05 NOTE — ED Notes (Signed)
[name]  [name] (daughter in law) ; [phone]

## 2016-12-05 NOTE — ED Notes (Signed)
[phone] Wynne Dust- friend   [phone] Son of patient

## 2016-12-05 NOTE — Therapy (Signed)
[facility] [address] [place], [place], [address] Phone: [phone]   Fax:  [phone]  Physical Therapy Evaluation  Patient Details  Name: Rachel Valentine MRN: [id number] Date of Birth: 1940/12/23 Referring Provider: Dr. Darcus Austin, MD  Encounter Date: 12/05/2016      PT End of Session - 12/05/16 1139    Visit Number 1   Number of Visits 10   Date for PT Re-Evaluation 01/19/17   Authorization Type G code every 10th visit   PT Start Time 1100   PT Stop Time 1145   PT Time Calculation (min) 45 min   Activity Tolerance Other (comment)  nausea/vomitting   Behavior During Therapy [facility] for tasks assessed/performed      Past Medical History:  Diagnosis Date  . Bowel obstruction     Past Surgical History:  Procedure Laterality Date  . ABDOMINAL HYSTERECTOMY    . ABDOMINAL SURGERY    . APPENDECTOMY      There were no vitals filed for this visit.       Subjective Assessment - 12/05/16 1104    Subjective The patient notes sudden onset of room spinning dizziness 2 weeks ago associated with using wax removal/ear cleaning system.  She had nausea/vomitting and feld bad x days.  Symptoms are slowly improving.  She has daily headaches, dizziness, nausea.  She is unable to read and is haivng to lie down t/o the day.  She further describes a sensation as spinning and her entire head being in a fog, noting she cannot concentrate.  Symptoms are worse with rolling to the right side or sleeping on the right side so she avoids that side.   Pertinent History Had intermittent episodes of dizziness t/o life (beginning in teenage years), gets every few years.  She does not note hearing changes.     Patient Stated Goals "I'm hoping I can do something to jar things back into normality."   Currently in Pain? Yes   Pain Score --  Dull ache   Pain Location Head   Pain Descriptors / Indicators Aching   Pain Type Chronic pain    Pain Onset 1 to 4 weeks ago   Pain Frequency Intermittent   Aggravating Factors  unsure   Pain Relieving Factors unsure            [facility] PT Assessment - 12/05/16 1109      Assessment   Medical Diagnosis vertigo   Referring Provider Dr. Darcus Austin, MD   Onset Date/Surgical Date --  10/2016   Prior Therapy none     Precautions   Precautions None   Precaution Comments *Seeing a chiropractor for her back and neck     Restrictions   Weight Bearing Restrictions No     Balance Screen   Has the patient fallen in the past 6 months No   Has the patient had a decrease in activity level because of a fear of falling?  No   Is the patient reluctant to leave their home because of a fear of falling?  No     Home Environment   Living Environment Private residence   Home Access Level entry   [place] One level   Additional Comments She has returned to walkign 3 miles/day "its an effort, but I'm forcing myself to do that"     Prior Function   Level of Independence Independent   Leisure walking, volunteering     Observation/Other Assessments  Focus on Therapeutic Outcomes (FOTO)  75%   Other Surveys  Other Surveys   Dizziness Handicap Inventory [facility])  42%            Vestibular Assessment - 12/05/16 1110      Vestibular Assessment   General Observation Reports that in her teenage years she had episode x 3 weeks of bad headache and vertigo.  Has h/o migraines with visual aura. Head pressure bilateral sides.     Symptom Behavior   Type of Dizziness Spinning  headache, nausea   Frequency of Dizziness daily   Duration of Dizziness intermittent x seconds to minutes   Aggravating Factors Forward bending  lying on R side, avoiding positional changes   Relieving Factors Head stationary     Occulomotor Exam   Occulomotor Alignment Normal   Spontaneous Absent   Gaze-induced Absent   Smooth Pursuits Intact   Saccades Intact     Vestibulo-Occular Reflex   VOR 1 Head  Only (x 1 viewing) Baseline= "yucky" baseline 2/10;  slow VOR able to keep fixation.   Comment Head impulse test=positive bilaterally for refixation saccade describing increasing dizziness.     Positional Testing   Dix-Hallpike Dix-Hallpike Right;Dix-Hallpike Left   Sidelying Test --   Horizontal Canal Testing Horizontal Canal Right;Horizontal Canal Left     Dix-Hallpike Right   Dix-Hallpike Right Duration 6/10 sensation of dizziness with nausea worse upon return to sit, lasts x 10-15 seconds   Dix-Hallpike Right Symptoms No nystagmus  viewed in room light, noted intensity worse to right     Dix-Hallpike Left   Dix-Hallpike Left Duration 5-6/10 dizziness   Dix-Hallpike Left Symptoms No nystagmus  viewed in room light, takes seconds to settle, HA increases     Horizontal Canal Right   Horizontal Canal Right Duration "I'm just yucked" reporting dizzy and nauseous with movement.   Horizontal Canal Right Symptoms Normal     Horizontal Canal Left   Horizontal Canal Left Duration nausea/dizziness   Horizontal Canal Left Symptoms Normal                Vestibular Treatment/Exercise - 12/05/16 1155      Vestibular Treatment/Exercise   Vestibular Treatment Provided --  Unable to tolerate treatment due to nausea/vomitting               PT Education - 12/05/16 1138    Education provided Yes   Education Details nature of symptoms, continuing current home walking program, HELD on HEP for slow head motion due to nausea/vomitting and headache with assessment.   Person(s) Educated Patient   Methods Explanation;Demonstration;Handout   Comprehension Verbalized understanding;Returned demonstration          PT Short Term Goals - 12/05/16 1150      PT SHORT TERM GOAL #1   Title The patient will be indep with HEP for habituation for motion sensitivity, gaze x 1 viewing.   Baseline Target date 01/04/2017   Time 4   Period Weeks     PT SHORT TERM GOAL #2   Title The  patient will tolerate sit<>bilateral sidelying with change in dizziness < or equal to 3/10 from baseline.   Baseline Target date 01/04/2017   Time 4   Period Weeks     PT SHORT TERM GOAL #3   Title The patient will note no nausea with rolling supine<>bilateral sidelying for improved tolerance to bed mobility and sleeping positions.   Baseline Target date 01/04/2017   Time 4  Period Weeks     PT SHORT TERM GOAL #4   Title The patient will tolerate gaze x 1 viewing x 20 seconds at self regulated pace to demo improved tolerance to head motion.   Baseline Target date 01/04/2017   Time 4   Period Weeks           PT Long Term Goals - 12/20/16 1147      PT LONG TERM GOAL #1   Title The patient will reduce DHI from 42% to < or equal to 26% to demo dec'ing self perception of dizziness.   Baseline Target date 01/19/2017   Time 6   Period Weeks     PT LONG TERM GOAL #2   Title The patient will tolerate seated gaze x 1 adaptation x 30 seconds with change in symptoms < or equal to 2/10 from baseline.   Baseline Target date 01/19/2017   Time 6   Period Weeks     PT LONG TERM GOAL #3   Title The patient will tolerate sit<>bilateral sidelying and supine<>bilateral roll with no increase in symptoms of dizziness, nausea or headache.   Baseline Target date 01/19/2017   Time 6   Period Weeks               Plan - 2016-12-20 1126    Clinical Impression Statement The patient is a 76 year old female presenting to OP rehab with h/o episodic vertigo off and on since teenage years.  At today's visit, she has positive head impulse test indicating diminished use of VOR, motion sensitivity with nausea with all movements, and positional sensitivities worse with R roll and R dix hallpike (however nystagmus not viewed in room light).  Patient has to rest t/o evaluation due to nausea/ giving symptoms time to settle.  Patient could not tolerate treatment today as nausea increased and she had vomitting and  significant headache.   Rehab Potential Good   PT Frequency 2x / week  then 1x/week x 2 weeks   PT Duration 4 weeks   PT Treatment/Interventions ADLs/Self Care Home Management;Canalith Repostioning;Patient/family education;Gait training;Neuromuscular re-education;Therapeutic activities;Therapeutic exercise;Balance training;Vestibular   PT Next Visit Plan Add HEP, gentle, for habituation, slow gaze and general positional sensitivities.    Consulted and Agree with Plan of Care Patient      Patient will benefit from skilled therapeutic intervention in order to improve the following deficits and impairments:  Abnormal gait, Dizziness, Decreased activity tolerance, Decreased balance  Visit Diagnosis: Dizziness and giddiness  Other abnormalities of gait and mobility  BPPV (benign paroxysmal positional vertigo), right      G-Codes - 12-20-2016 1141    Functional Assessment Tool Used (Outpatient Only) DHI=42%   Functional Limitation Self care   Self Care Current Status (Z6109) At least 40 percent but less than 60 percent impaired, limited or restricted   Self Care Goal Status (U0454) At least 20 percent but less than 40 percent impaired, limited or restricted       Problem List There are no active problems to display for this patient.   [place], Melba 12/20/16, 11:57 AM  [facility] [address] [place], [place], [address] Phone: [phone]   Fax:  [phone]  Name: LEGEND PECORE MRN: [id number] Date of Birth: [birth year]

## 2016-12-05 NOTE — ED Notes (Signed)
ED Provider at bedside. 

## 2016-12-05 NOTE — ED Provider Notes (Signed)
[facility] DEPT Provider Note   CSN: [id number] Arrival date & time: 12/05/16  1308     History   Chief Complaint Chief Complaint  Patient presents with  . Hypertension    HPI Rachel Valentine is a 76 y.o. female.  She presents today for evaluation of dizziness with nausea and vomiting. She reports that she was at her PT's office for vestibular rehabilitation secondary to previously diagnosed BPPV.  Wall at the office she reported that they moved her head which caused her to experience a sudden onset of severe dizziness associated with nausea and vomiting. The movement that caused to the symptoms was not sudden or abrupt. She does not feel lightheaded or like she is going to pass out, said feels like things are moving even though they are not.  PTA she was given 4 MG Zofran by EMS which did not resolve her symptoms.  She denies headache, weakness, neck pain.  She denies any changes in bowel or bladder function. No numbness or tingling.  No slurred speech or facial droop.        Past Medical History:  Diagnosis Date  . Bowel obstruction     There are no active problems to display for this patient.   Past Surgical History:  Procedure Laterality Date  . ABDOMINAL HYSTERECTOMY    . ABDOMINAL SURGERY    . APPENDECTOMY      OB History    No data available       Home Medications    Prior to Admission medications   Medication Sig Start Date End Date Taking? Authorizing Provider  aspirin 81 MG chewable tablet Chew 81 mg by mouth daily.   Yes Historical Provider, MD  calcium citrate (CALCITRATE - DOSED IN MG ELEMENTAL CALCIUM) 950 MG tablet Take 200 mg of elemental calcium by mouth daily.   Yes Historical Provider, MD  cholecalciferol (VITAMIN D) 1000 units tablet Take 1,000 Units by mouth daily.   Yes Historical Provider, MD  ibuprofen (ADVIL,MOTRIN) 200 MG tablet Take 200 mg by mouth every 6 (six) hours as needed for mild pain.   Yes Historical Provider, MD     Family History No family history on file.  Social History Social History  Substance Use Topics  . Smoking status: Former Research scientist (life sciences)  . Smokeless tobacco: Never Used  . Alcohol use No     Allergies   Penicillins and Prednisone   Review of Systems Review of Systems  Constitutional: Negative for chills, diaphoresis, fatigue and fever.  HENT: Negative for congestion, ear pain, facial swelling, hearing loss, sore throat and tinnitus.   Eyes: Positive for photophobia. Negative for pain and visual disturbance.  Respiratory: Negative for cough and shortness of breath.   Cardiovascular: Negative for chest pain and palpitations.  Gastrointestinal: Positive for nausea and vomiting. Negative for abdominal pain.  Genitourinary: Negative for dysuria and hematuria.  Musculoskeletal: Negative for arthralgias, back pain, neck pain and neck stiffness.  Skin: Negative for color change and rash.  Neurological: Positive for dizziness. Negative for seizures, syncope, facial asymmetry, light-headedness, numbness and headaches.     Physical Exam Updated Vital Signs BP (!) 157/73   Pulse 91   Resp 17   Ht 5\' 2"  (1.575 m)   Wt 52.2 kg   SpO2 98%   BMI 21.03 kg/m   Physical Exam  Constitutional: She appears well-developed and well-nourished. No distress.  HENT:  Head: Normocephalic and atraumatic.  Eyes: Conjunctivae and EOM are normal. Pupils are equal,  round, and reactive to light. Right eye exhibits no discharge. Left eye exhibits no discharge. No scleral icterus.  Neck: Normal range of motion. Neck supple. Normal carotid pulses and no JVD present. Carotid bruit is not present. No neck rigidity. No tracheal deviation and normal range of motion present.  Cardiovascular: Normal rate, regular rhythm, normal heart sounds and intact distal pulses.  Exam reveals no gallop and no friction rub.   No murmur heard. Pulmonary/Chest: Effort normal and breath sounds normal. No stridor. No respiratory  distress. She has no wheezes. She has no rales.  Abdominal: Soft. Bowel sounds are normal. She exhibits no distension. There is no tenderness.  Musculoskeletal: She exhibits no edema or deformity.  Neurological: She is alert. She has normal strength. She displays no tremor. No cranial nerve deficit or sensory deficit. She exhibits normal muscle tone. Coordination normal. GCS eye subscore is 4. GCS verbal subscore is 5. GCS motor subscore is 6.  Skin: Skin is warm and dry. She is not diaphoretic.  Psychiatric: She has a normal mood and affect.  Nursing note and vitals reviewed.    ED Treatments / Results  Labs (all labs ordered are listed, but only abnormal results are displayed) Labs Reviewed  BASIC METABOLIC PANEL - Abnormal; Notable for the following:       Result Value   Potassium 5.4 (*)    Glucose, Bld 162 (*)    All other components within normal limits  CBC - Abnormal; Notable for the following:    WBC 12.2 (*)    All other components within normal limits    EKG  EKG Interpretation  Date/Time:  Thursday December 05 2016 14:10:39 EDT Ventricular Rate:  69 PR Interval:    QRS Duration: 88 QT Interval:  442 QTC Calculation: 474 R Axis:   76 Text Interpretation:  Sinus rhythm No old tracing to compare Confirmed by [facility]  MD-J, JON ([id number]) on 12/05/2016 2:31:35 PM Also confirmed by [facility]  MD-J, JON [phone]), editor [place] CT, Leda Gauze [phone])  on 12/05/2016 2:54:33 PM       Radiology Ct Head Wo Contrast  Result Date: 12/05/2016 CLINICAL DATA:  Dizziness with nausea and vomiting EXAM: CT HEAD WITHOUT CONTRAST TECHNIQUE: Contiguous axial images were obtained from the base of the skull through the vertex without intravenous contrast. COMPARISON:  None. FINDINGS: Brain: There is age related volume loss. There is no intracranial mass, hemorrhage, extra-axial fluid collection, or midline shift. There is slight small vessel disease in the centra semiovale bilaterally. Elsewhere, gray-white  compartments appear normal. There is no evident acute infarct. Vascular: No hyperdense vessels. There is calcification in each carotid siphon region. Skull: Bony calvarium appears intact. Sinuses/Orbits: There is slight mucosal thickening in several ethmoid air cells. Other visualized paranasal sinuses are clear. Orbits appear symmetric bilaterally. Other: Mastoid air cells are clear. IMPRESSION: Age related volume loss with slight periventricular small vessel disease. No intracranial mass, hemorrhage, or extra-axial fluid collection. No acute infarct. Areas of arterial vascular calcification noted. Slight ethmoid sinus mucosal thickening. Electronically Signed   By: Lowella Grip III M.D.   On: 12/05/2016 14:50    Procedures Procedures (including critical care time)  Medications Ordered in ED Medications  promethazine (PHENERGAN) injection 12.5 mg (12.5 mg Intravenous Given 12/05/16 1417)  diphenhydrAMINE (BENADRYL) injection 12.5 mg (12.5 mg Intravenous Given 12/05/16 1417)     Initial Impression / Assessment and Plan / ED Course  I have reviewed the triage vital signs and the nursing notes.  Pertinent labs & imaging results that were available during my care of the patient were reviewed by me and considered in my medical decision making (see chart for details).   [id number]- Patient re-checked after medications and since back from CT.  She reports feeling sleepy but much less dizzy and nauseous.  No nystagmus noted.  73- Per RN patient was able to ambulate well with out assistance.  Patient was reassessed and reports she feels much better.  Patient is more interactive and smiling.    Rachel Valentine presented today with high blood pressure associated with dizziness.  Her dizziness started while at PT for vestibular rehabilitation after being diagnosed with BPPV.  She reports that the therapist had instructed her to move her head around and not caused a sudden onset of dizziness/feeling like  things were spinning.  She became nauseous and was noted to have a high blood pressure. Her nausea and vertigo were treated here with Phenergan and Benadryl IV. CT head revealed no acute abnormalities. She has a normal neuro exam with the exception of dizziness.  While in the ED her symptoms improved dramatically and she was able to ambulate safely without assistance.  The patient was discussed and seen by Dr. Hillard Danker who agreed with my plan, treatment, and disposition.  Patient was advised not to drive or operate heavy machinery for the next 24 hours.  As patient's symptoms got better it was noted that her blood pressure returned to her baseline. She was advised to contact her PCP tomorrow morning for a follow-up appointment.  Patient was given strict return precautions for which she voiced her understanding and was agreeable to discharge.    She reports that her PCP gave her a prescription for Antivert however she has not started that yet.   Final Clinical Impressions(s) / ED Diagnoses   Final diagnoses:  Hypertension, unspecified type  Dizziness  Non-intractable vomiting with nausea, unspecified vomiting type    New Prescriptions New Prescriptions   No medications on file     Lorin Glass, PA-C 12/05/16 [place], MD 12/06/16 [phone]

## 2016-12-05 NOTE — ED Triage Notes (Signed)
CO of dizziness with N/V for the past 2 weeks. She was referred to neurologist by her PCP. Once there today, she was being tested for vertigo and became dizzy with the N/V. Her BP was found to be systolic in the 154'M with diastolic in low 086'P. 4 zofran with EMS with no real relief

## 2016-12-24 ENCOUNTER — Ambulatory Visit: Payer: Medicare Other | Admitting: Rehabilitative and Restorative Service Providers"

## 2016-12-24 DIAGNOSIS — H8111 Benign paroxysmal vertigo, right ear: Secondary | ICD-10-CM

## 2016-12-24 DIAGNOSIS — R2689 Other abnormalities of gait and mobility: Secondary | ICD-10-CM

## 2016-12-24 DIAGNOSIS — R42 Dizziness and giddiness: Secondary | ICD-10-CM

## 2016-12-24 NOTE — Patient Instructions (Addendum)
Gaze Stabilization: Sitting    Keeping eyes on target on wall 3 feet away (or hold at arm's distance), and move head side to side for _5-10 times. Repeat while moving head up and down for _5-10 times. Do __2-3__ sessions per day.  Increase to 30 seconds as you can tolerate.  Copyright  VHI. All rights reserved.   Gaze Stabilization: Tip Card  1.Target must remain in focus, not blurry, and appear stationary while head is in motion. 2.Perform exercises with small head movements (45 to either side of midline). 3.Increase speed of head motion so long as target is in focus. 4.If you wear eyeglasses, be sure you can see target through lens (therapist will give specific instructions for bifocal / progressive lenses). 5.These exercises may provoke dizziness or nausea. Work through these symptoms. If too dizzy, slow head movement slightly. Rest between each exercise. 6.Exercises demand concentration; avoid distractions.  Copyright  VHI. All rights reserved.   ONLY DO IF YOU CAN TOLERATE AND KEEP SYMPTOMS BELOW A 4/10.  Habituation - Tip Card  1.The goal of habituation training is to assist in decreasing symptoms of vertigo, dizziness, or nausea provoked by specific head and body motions. 2.These exercises may initially increase symptoms; however, be persistent and work through symptoms. With repetition and time, the exercises will assist in reducing or eliminating symptoms. 3.Exercises should be stopped and discussed with the therapist if you experience any of the following: - Sudden change or fluctuation in hearing - New onset of ringing in the ears, or increase in current intensity - Any fluid discharge from the ear - Severe pain in neck or back - Extreme nausea  Copyright  VHI. All rights reserved.   Habituation - Sit to Side-Lying   Sit on edge of bed. Lie down onto the right side and hold until dizziness stops, plus 20 seconds.  Return to sitting and wait until dizziness stops, plus  20 seconds.  Repeat to the left side. Repeat sequence 2-3 times per session. Do 2 sessions per day.    Can also modify by using 3 pillows when you lie down initially to get used to movement.  Can also begin by going very slowly onto your side.   Once you are tolerating this well,  You can increase to 5 repetitions, then work to using 1 pillow.  Can also progress to 3 times/day.    Copyright  VHI. All rights reserved.    Special Instructions: Exercises may bring on mild to moderate symptoms of nausea, dizziness that resolve within 20-30 minutes of completing exercises. If symptoms are lasting longer than 30 minutes, modify your exercises by:  >decreasing the # of times you complete each activity >ensuring your symptoms return to baseline before moving onto the next exercise >dividing up exercises so you do not do them all in one session, but multiple short sessions throughout the day >doing them once a day until symptoms improve

## 2016-12-24 NOTE — Therapy (Signed)
[facility] [address] [place], [place], [address] Phone: [phone]   Fax:  [phone]  Physical Therapy Treatment  Patient Details  Name: Rachel Valentine MRN: [id number] Date of Birth: October 07, 1940 Referring Provider: Dr. Darcus Austin, MD  Encounter Date: 12/24/2016      PT End of Session - 12/24/16 1240    Visit Number 2   Number of Visits 10   Date for PT Re-Evaluation 01/19/17   Authorization Type G code every 10th visit   PT Start Time 1232   PT Stop Time 1315   PT Time Calculation (min) 43 min   Activity Tolerance Patient tolerated treatment well   Behavior During Therapy [facility] for tasks assessed/performed      Past Medical History:  Diagnosis Date  . Bowel obstruction [facility])     Past Surgical History:  Procedure Laterality Date  . ABDOMINAL HYSTERECTOMY    . ABDOMINAL SURGERY    . APPENDECTOMY      There were no vitals filed for this visit.      Subjective Assessment - 12/24/16 1237    Subjective The patient reports that she is significantly improved since evaluation and her blood pressure has been improved (she is checking it every day).  She notes she continues with foggy headed sensation every day and does feel some mild symptoms with head motion and position changes.  She notes she is "careful" not to bring it on.  She feels her worse episode is a 4/10.  Her balance is off intermittently.  She notes she has returned to driving.   Pertinent History Had intermittent episodes of dizziness t/o life (beginning in teenage years), gets every few years.  She does not note hearing changes.     Patient Stated Goals "I'm hoping I can do something to jar things back into normality."   Currently in Pain? No/denies                Vestibular Assessment - 12/24/16 1241      Vestibular Assessment   General Observation Patient walks into clinic independently.   0/10 baseline symptoms.       Symptom Behavior   Type of Dizziness --  foggy sensation   Frequency of Dizziness daily   Duration of Dizziness constant t/o the day, can vary in intensity     Occulomotor Exam   Occulomotor Alignment Normal   Spontaneous Absent   Gaze-induced Absent   Smooth Pursuits Intact     Vestibulo-Occular Reflex   VOR 1 Head Only (x 1 viewing) Slow VOR, maintains gaze fixation.   Comment Head impulse test=positive bilaterally with greater refixation saccade to the R than the Left     Positional Testing   Dix-Hallpike Dix-Hallpike Right;Dix-Hallpike Left  used frenzels to block fixation--no nystagmus viewed   Sidelying Test Sidelying Right;Sidelying Left   Horizontal Canal Testing Horizontal Canal Right;Horizontal Canal Left     Dix-Hallpike Right   Dix-Hallpike Right Duration none   Dix-Hallpike Right Symptoms No nystagmus  repeated with frenzels donned to block fixation     Sidelying Right   Sidelying Right Duration slight sensation of dizziness, settles within seconds.  Return to sitting takes a few moments to let head "catch up"   Sidelying Right Symptoms No nystagmus  viewed in room light     Sidelying Left   Sidelying Left Duration none   Sidelying Left Symptoms No nystagmus     Horizontal Canal Right   Horizontal  Canal Right Duration none   Horizontal Canal Right Symptoms Normal     Horizontal Canal Left   Horizontal Canal Left Duration none   Horizontal Canal Left Symptoms Normal                  Vestibular Treatment/Exercise - 12/24/16 1258      Vestibular Treatment/Exercise   Vestibular Treatment Provided Habituation;Gaze   Habituation Exercises Nestor Lewandowsky   Gaze Exercises X1 Viewing Horizontal;X1 Viewing Vertical     Nestor Lewandowsky   Number of Reps  1   Symptom Description  Right side provokes "yucky" sensation with mild symptoms (no nystagmus viewed in roomlight); left side provokes mild nausea, no dizziness.  When patient sits up she notes feeling  hot, and feels motion sickness is provoked by movements.  PT discussed modifications to home activities (using 3 pillows, moving slower, and performing 1-2 reps to begin).      X1 Viewing Horizontal   Foot Position standing x 1   Comments 30 seconds with cues for visual fixation on target.               PT Education - 12/24/16 1307    Education provided Yes   Education Details HEP: habituation, gaze x 1 *had to modify as exercises provoke symptoms to moderate level.  Had to wait in between each activity   Person(s) Educated Patient   Methods Explanation;Demonstration;Handout   Comprehension Verbalized understanding;Returned demonstration           PT Long Term Goals - 12/24/16 1308      PT LONG TERM GOAL #1   Title The patient will be indep with HEP for habituation and gaze x 1.   Baseline Target date 01/23/2017   Time 4   Period Weeks     PT LONG TERM GOAL #2   Title The patient will improve from [facility] of 42% to < or equal to 28% to demo dec'd self perception of dizziness.   Baseline Target date 01/23/17   Time 4   Period Weeks     PT LONG TERM GOAL #3   Title The patient will tolerate sit<>bilateral sidelying without nausea or dizziness indicating resolution of motion sensitivity/BPPv.   Baseline Target date 01/23/17   Time 4   Period Weeks     PT LONG TERM GOAL #4   Title The patient will return demo adaptation gaze x 1 x 30 seconds nonstop to demo improved tolerance to head motion.   Baseline Target date 01/23/17   Time 4   Period Weeks               Plan - 12/24/16 1936    Clinical Impression Statement At initial evaluation 2 weeks ago, the patient had motion sickness aggravated during testing and also had hypertension.  She has been monitoring her BP and notes improvement.  She arrived today reporting significant improvement in symptoms, but continued "foggy" sensation with intermittent dizziness/imbalance.  When PT tests patient for positional vertigo,  she notes increased symptoms, however no nystagmus viewed in room light or with frenzel lenses donned.  Patient may be continuing with motion sensitivity after having vestibular hypofunction (as she has positive head impulse test bilaterally) and this is magnified b/c of h/o motion sickness.  The patient did experience n/v again today after 1 repetition of habituation.    PT educated her on how to improve tolerance to home and instructed her to take nausea meds as prescribed by MD  to tolerate activities.  Her symptoms are clearly linked to gaze activities and motion sensitivity per our examination.    Rehab Potential Good   PT Frequency 1x / week   PT Duration 4 weeks   PT Treatment/Interventions ADLs/Self Care Home Management;Therapeutic activities;Therapeutic exercise;Neuromuscular re-education;Canalith Repostioning;Vestibular;Gait training;Functional mobility training;Patient/family education   PT Next Visit Plan Check HEP, progress to tolerance.  *allow symptoms to return to baseline between all activities due to motion sickness   Consulted and Agree with Plan of Care Patient      Patient will benefit from skilled therapeutic intervention in order to improve the following deficits and impairments:  Difficulty walking, Dizziness, Decreased activity tolerance, Decreased balance, Decreased mobility  Visit Diagnosis: Dizziness and giddiness  Other abnormalities of gait and mobility  BPPV (benign paroxysmal positional vertigo), right     Problem List There are no active problems to display for this patient.   [place], [place] 12/24/2016, 7:42 PM  [facility] [address] [place], [place], [address] Phone: [phone]   Fax:  [phone]  Name: Rachel Valentine MRN: [id number] Date of Birth: 09/29/1940

## 2017-01-08 ENCOUNTER — Ambulatory Visit: Payer: Medicare Other | Attending: Family Medicine | Admitting: Rehabilitative and Restorative Service Providers"

## 2017-01-08 DIAGNOSIS — H8111 Benign paroxysmal vertigo, right ear: Secondary | ICD-10-CM | POA: Diagnosis present

## 2017-01-08 DIAGNOSIS — R42 Dizziness and giddiness: Secondary | ICD-10-CM | POA: Insufficient documentation

## 2017-01-08 DIAGNOSIS — R2689 Other abnormalities of gait and mobility: Secondary | ICD-10-CM | POA: Insufficient documentation

## 2017-01-08 NOTE — Patient Instructions (Signed)
For home exercises, if you wake up with dizziness of 5/10, then do not perform exercises that day (may create more nausea and worsening of symptoms).  When doing exercises, do not allow symptoms to go above 5/10.  *CONTINUE PRIOR EXERCISES*  Feet Together (Compliant Surface) Varied Arm Positions - Eyes Closed    Stand on compliant surface: __pillow___ with feet together and arms out. Close eyes and visualize upright position. Hold__30__ seconds. Repeat __3__ times per session. Do __1-2__ sessions per day.  Copyright  VHI. All rights reserved.

## 2017-01-08 NOTE — Therapy (Signed)
[facility] [address] [place], [place], [address] Phone: [phone]   Fax:  [phone]  Physical Therapy Treatment  Patient Details  Name: Rachel Valentine MRN: [id number] Date of Birth: 08-07-1941 Referring Provider: Dr. Darcus Austin, MD  Encounter Date: 01/08/2017      PT End of Session - 01/08/17 1038    Visit Number 3   Number of Visits 10   Date for PT Re-Evaluation 01/19/17   Authorization Type G code every 10th visit   PT Start Time 0932   PT Stop Time 1018   PT Time Calculation (min) 46 min   Activity Tolerance Patient tolerated treatment well   Behavior During Therapy [facility] for tasks assessed/performed      Past Medical History:  Diagnosis Date  . Bowel obstruction [facility])     Past Surgical History:  Procedure Laterality Date  . ABDOMINAL HYSTERECTOMY    . ABDOMINAL SURGERY    . APPENDECTOMY      There were no vitals filed for this visit.      Subjective Assessment - 01/08/17 0938    Subjective The patient is continuing with intermittent bouts of vertigo "not bad, but you know it is there."  She notes feeling it is affecting her memory.  She is monitoring blood pressure and notes it fluctuates.  She reports the sit<>sidelying exercise is improving, she is still noticing difficulty with gaze x 1 adaptaiton.  The patient woke with headache, foggy sensation today.   Pertinent History Had intermittent episodes of dizziness t/o life (beginning in teenage years), gets every few years.  She does not note hearing changes.     Patient Stated Goals "I'm hoping I can do something to jar things back into normality."   Currently in Pain? No/denies                Vestibular Assessment - 01/08/17 0942      Vestibular Assessment   General Observation 5/10 baseline symptoms:  fogginess, "it's there", my head doesn't feel normal, she knows if she moves wrong it provokes it.  140/82 resting BP.      Orthostatics   BP supine (x 5 minutes) 160/90   HR supine (x 5 minutes) 72   BP standing (after 1 minute) 170/96  lightheaded sensation   HR standing (after 1 minute) 80   BP standing (after 3 minutes) 130/88   HR standing (after 3 minutes) 76                 [facility] Adult PT Treatment/Exercise - 01/08/17 1035      Neuro Re-ed    Neuro Re-ed Details  Patient performed compliant standing with eyes closed noting increased sway and able to maintain position x 10 seconds.  Had patient widen base of support and she was able to perform on foam.  Recommended for HEP to use pillow and maintain narrower base of support.           Vestibular Treatment/Exercise - 01/08/17 0951      Vestibular Treatment/Exercise   Vestibular Treatment Provided Habituation;Gaze   Habituation Exercises Legrand [place] Daroff   Number of Reps  3   Symptom Description  First rep on the right does not provoke symptoms.  Mild sensation of dizziness with return to sitting.  She has mild symptoms with L sidelying and it is worse with return to sitting.    She describes sensation with return to  sitting as sensation of spinning and "you know it is not normal".   She also gets a mild nausea.       X1 Viewing Horizontal   Foot Position seated    Comments x 15 reps with cues to slow down to visually fixate on target.     X1 Viewing Vertical   Foot Position seated   Comments x 15 reps with 5/10 symptoms, reporting "it spins".  This exercise provokes nausea as well.                PT Education - 01/08/17 1036    Education provided Yes   Education Details HEP: foam standing with eyes closed, reviewed prior exercises   Person(s) Educated Patient   Methods Explanation;Demonstration;Handout   Comprehension Returned demonstration;Verbalized understanding            PT Long Term Goals - 12/24/16 1308      PT LONG TERM GOAL #1   Title The patient will be indep with HEP for habituation and  gaze x 1.   Baseline Target date 01/23/2017   Time 4   Period Weeks     PT LONG TERM GOAL #2   Title The patient will improve from [place] of 42% to < or equal to 28% to demo dec'd self perception of dizziness.   Baseline Target date 01/23/17   Time 4   Period Weeks     PT LONG TERM GOAL #3   Title The patient will tolerate sit<>bilateral sidelying without nausea or dizziness indicating resolution of motion sensitivity/BPPv.   Baseline Target date 01/23/17   Time 4   Period Weeks     PT LONG TERM GOAL #4   Title The patient will return demo adaptation gaze x 1 x 30 seconds nonstop to demo improved tolerance to head motion.   Baseline Target date 01/23/17   Time 4   Period Weeks               Plan - 01/08/17 1039    Clinical Impression Statement The patient is making progress with tolerance to movement as she tolerated gaze adaptation and habituation exercises today without vomitting.  The patient may have multiple factors affecting her perception of dizziness as she has intermittent episodes that do not occur in a pattern.  Some days she wakes up feeling "foggy", she reports some intermittent memory issues (couldn't figure out how to use her car key that flips open, hard to recall names), she notes fluctuating blood pressure readings, and she has difficulty with quick turns.  PT monitored BP today during orthostatic measurements and the patient initially had an increase in BP upon standing, then it dropped by 40 systolic.  This could be leading to "foggy", lightheaded sensation with rising.  Her clinical exam correlates to vestibular nerve weakness per positive head impulse testing and she is doing gaze adaptation exercises to address.  For positional movements, right sidelying is much improved over last session and her cc is sense of lightheaded/spinning with return to sit.  PT recommended she continue current HEP and f/u in 2 weeks as needed (she preferred not to schedule, but will call if  needed).    PT Treatment/Interventions ADLs/Self Care Home Management;Therapeutic activities;Therapeutic exercise;Neuromuscular re-education;Canalith Repostioning;Vestibular;Gait training;Functional mobility training;Patient/family education   PT Next Visit Plan Check HEP, progress gaze to standing and increse time, balance activities and motion sensitivity habituation program as tolerated   Consulted and Agree with Plan of Care Patient  Patient will benefit from skilled therapeutic intervention in order to improve the following deficits and impairments:  Difficulty walking, Dizziness, Decreased activity tolerance, Decreased balance, Decreased mobility  Visit Diagnosis: Dizziness and giddiness  Other abnormalities of gait and mobility  BPPV (benign paroxysmal positional vertigo), right     Problem List There are no active problems to display for this patient.   [place], [place] 01/08/2017, 10:46 AM  [facility] [address] [place], [place], [address] Phone: [phone]   Fax:  [phone]  Name: JURNIE GARRITANO MRN: [id number] Date of Birth: 05/31/1941

## 2017-01-13 ENCOUNTER — Ambulatory Visit
Admission: RE | Admit: 2017-01-13 | Discharge: 2017-01-13 | Disposition: A | Discharge status: Home / Self Care | Payer: Medicare Other | Source: Ambulatory Visit | Attending: Family Medicine | Admitting: Family Medicine

## 2017-01-13 DIAGNOSIS — Z1231 Encounter for screening mammogram for malignant neoplasm of breast: Secondary | ICD-10-CM

## 2017-01-13 DIAGNOSIS — M810 Age-related osteoporosis without current pathological fracture: Secondary | ICD-10-CM

## 2017-01-15 ENCOUNTER — Other Ambulatory Visit: Payer: Self-pay | Admitting: Family Medicine

## 2017-01-15 DIAGNOSIS — R928 Other abnormal and inconclusive findings on diagnostic imaging of breast: Secondary | ICD-10-CM

## 2017-01-16 ENCOUNTER — Other Ambulatory Visit: Payer: Self-pay | Admitting: Rheumatology

## 2017-01-16 ENCOUNTER — Other Ambulatory Visit: Payer: Self-pay | Admitting: Family Medicine

## 2017-01-16 DIAGNOSIS — R928 Other abnormal and inconclusive findings on diagnostic imaging of breast: Secondary | ICD-10-CM

## 2017-01-20 ENCOUNTER — Other Ambulatory Visit: Payer: Self-pay | Admitting: Family Medicine

## 2017-01-20 ENCOUNTER — Ambulatory Visit
Admission: RE | Admit: 2017-01-20 | Discharge: 2017-01-20 | Disposition: A | Discharge status: Home / Self Care | Payer: Medicare Other | Source: Ambulatory Visit | Attending: Family Medicine | Admitting: Family Medicine

## 2017-01-20 DIAGNOSIS — R928 Other abnormal and inconclusive findings on diagnostic imaging of breast: Secondary | ICD-10-CM

## 2017-01-28 ENCOUNTER — Ambulatory Visit
Admission: RE | Admit: 2017-01-28 | Discharge: 2017-01-28 | Disposition: A | Discharge status: Home / Self Care | Payer: Medicare Other | Source: Ambulatory Visit | Attending: Family Medicine | Admitting: Family Medicine

## 2017-01-28 DIAGNOSIS — R928 Other abnormal and inconclusive findings on diagnostic imaging of breast: Secondary | ICD-10-CM

## 2017-02-19 ENCOUNTER — Encounter: Payer: Self-pay | Admitting: Rehabilitative and Restorative Service Providers"

## 2017-02-19 NOTE — Therapy (Signed)
[facility] [address] [place], [place], [address] Phone: [phone]   Fax:  [phone]  Patient Details  Name: Rachel Valentine MRN: [id number] Date of Birth: 1941-04-28 Referring Provider:  Darcus Austin, MD  Encounter Date: last encounter 01/08/17  PHYSICAL THERAPY DISCHARGE SUMMARY  Visits from Start of Care: 3  Current functional level related to goals / functional outcomes: Goals not reassessed due to patient not returning after 3rd visit.   Remaining deficits: See evaluation for patient deficits.   Education / Equipment: HEP for habituation, gaze and balance.  Plan: Patient agrees to discharge.  Patient goals were not met. Patient is being discharged due to not returning since the last visit.  ?????       Thank you for the referral of this patient. Rudell Cobb, MPT    [place]asha[place] Osmon 02/19/2017, 10:13 AM  [facility] [address] [place], [place], [address] Phone: [phone]   Fax:  [phone]

## 2017-12-04 DIAGNOSIS — Z0000 Encounter for general adult medical examination without abnormal findings: Secondary | ICD-10-CM | POA: Diagnosis not present

## 2017-12-04 DIAGNOSIS — M810 Age-related osteoporosis without current pathological fracture: Secondary | ICD-10-CM | POA: Diagnosis not present

## 2017-12-04 DIAGNOSIS — I700 Atherosclerosis of aorta: Secondary | ICD-10-CM | POA: Diagnosis not present

## 2017-12-04 DIAGNOSIS — I6523 Occlusion and stenosis of bilateral carotid arteries: Secondary | ICD-10-CM | POA: Diagnosis not present

## 2017-12-04 DIAGNOSIS — R42 Dizziness and giddiness: Secondary | ICD-10-CM | POA: Diagnosis not present

## 2017-12-04 DIAGNOSIS — E7800 Pure hypercholesterolemia, unspecified: Secondary | ICD-10-CM | POA: Diagnosis not present

## 2017-12-15 DIAGNOSIS — R69 Illness, unspecified: Secondary | ICD-10-CM | POA: Diagnosis not present

## 2017-12-16 DIAGNOSIS — J300 Vasomotor rhinitis: Secondary | ICD-10-CM | POA: Diagnosis not present

## 2017-12-16 DIAGNOSIS — J209 Acute bronchitis, unspecified: Secondary | ICD-10-CM | POA: Insufficient documentation

## 2017-12-16 DIAGNOSIS — K219 Gastro-esophageal reflux disease without esophagitis: Secondary | ICD-10-CM | POA: Diagnosis not present

## 2017-12-16 DIAGNOSIS — R42 Dizziness and giddiness: Secondary | ICD-10-CM | POA: Diagnosis not present

## 2017-12-30 DIAGNOSIS — R42 Dizziness and giddiness: Secondary | ICD-10-CM | POA: Diagnosis not present

## 2017-12-30 DIAGNOSIS — H903 Sensorineural hearing loss, bilateral: Secondary | ICD-10-CM | POA: Diagnosis not present

## 2018-01-05 DIAGNOSIS — Z880 Allergy status to penicillin: Secondary | ICD-10-CM | POA: Diagnosis not present

## 2018-01-05 DIAGNOSIS — J310 Chronic rhinitis: Secondary | ICD-10-CM | POA: Diagnosis not present

## 2018-01-05 DIAGNOSIS — Z8249 Family history of ischemic heart disease and other diseases of the circulatory system: Secondary | ICD-10-CM | POA: Diagnosis not present

## 2018-01-05 DIAGNOSIS — E785 Hyperlipidemia, unspecified: Secondary | ICD-10-CM | POA: Diagnosis not present

## 2018-01-05 DIAGNOSIS — R42 Dizziness and giddiness: Secondary | ICD-10-CM | POA: Diagnosis not present

## 2018-01-05 DIAGNOSIS — Z87891 Personal history of nicotine dependence: Secondary | ICD-10-CM | POA: Diagnosis not present

## 2018-01-05 DIAGNOSIS — Z823 Family history of stroke: Secondary | ICD-10-CM | POA: Diagnosis not present

## 2018-02-09 DIAGNOSIS — M79641 Pain in right hand: Secondary | ICD-10-CM | POA: Diagnosis not present

## 2018-02-09 DIAGNOSIS — E7800 Pure hypercholesterolemia, unspecified: Secondary | ICD-10-CM | POA: Diagnosis not present

## 2018-02-09 DIAGNOSIS — M25572 Pain in left ankle and joints of left foot: Secondary | ICD-10-CM | POA: Diagnosis not present

## 2018-02-09 DIAGNOSIS — Z79899 Other long term (current) drug therapy: Secondary | ICD-10-CM | POA: Diagnosis not present

## 2018-03-03 ENCOUNTER — Other Ambulatory Visit: Payer: Self-pay | Admitting: Family Medicine

## 2018-03-03 DIAGNOSIS — L603 Nail dystrophy: Secondary | ICD-10-CM | POA: Diagnosis not present

## 2018-03-03 DIAGNOSIS — L738 Other specified follicular disorders: Secondary | ICD-10-CM | POA: Diagnosis not present

## 2018-03-03 DIAGNOSIS — L853 Xerosis cutis: Secondary | ICD-10-CM | POA: Diagnosis not present

## 2018-03-03 DIAGNOSIS — L309 Dermatitis, unspecified: Secondary | ICD-10-CM | POA: Diagnosis not present

## 2018-03-03 DIAGNOSIS — Z1231 Encounter for screening mammogram for malignant neoplasm of breast: Secondary | ICD-10-CM

## 2018-03-03 DIAGNOSIS — L570 Actinic keratosis: Secondary | ICD-10-CM | POA: Diagnosis not present

## 2018-03-24 ENCOUNTER — Ambulatory Visit
Admission: RE | Admit: 2018-03-24 | Discharge: 2018-03-24 | Disposition: A | Discharge status: Home / Self Care | Payer: Medicare HMO | Source: Ambulatory Visit | Attending: Family Medicine | Admitting: Family Medicine

## 2018-03-24 DIAGNOSIS — Z1231 Encounter for screening mammogram for malignant neoplasm of breast: Secondary | ICD-10-CM

## 2018-03-24 IMAGING — MG DIGITAL SCREENING BILATERAL MAMMOGRAM WITH TOMO AND CAD
8 series · 9 of 24 positions shown · non-contrast
Comparison: Previous exam(s).

CLINICAL DATA: Screening.

EXAM:
DIGITAL SCREENING BILATERAL MAMMOGRAM WITH TOMO AND CAD

[R MLO synth-2D]
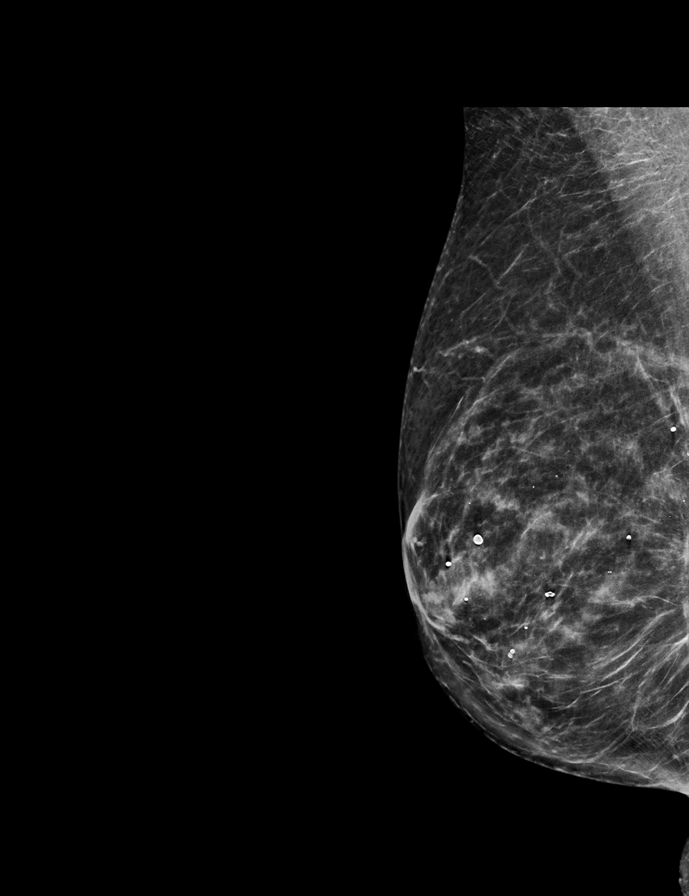

[R CC synth-2D]
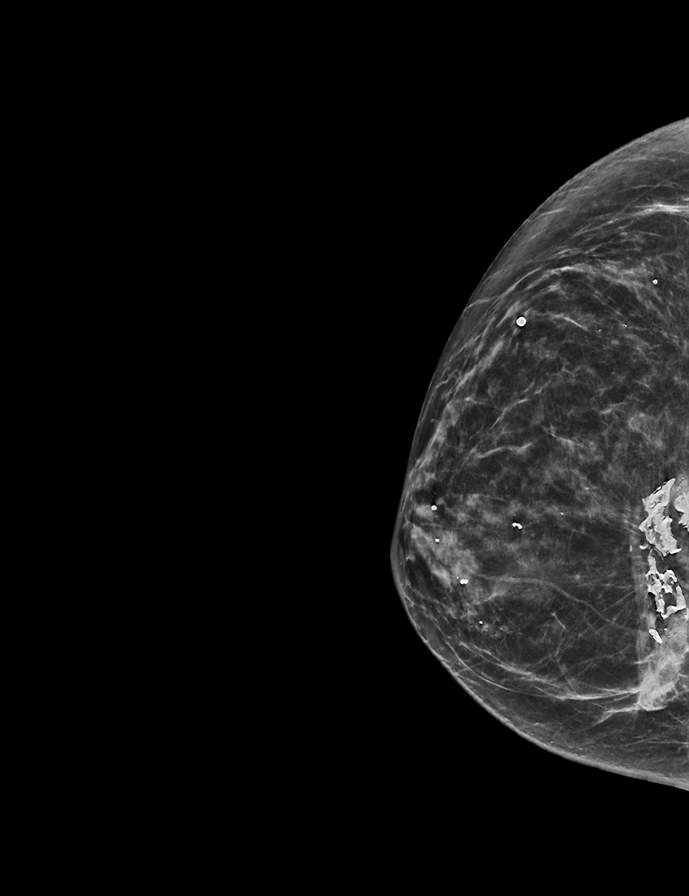

[L CC synth-2D]
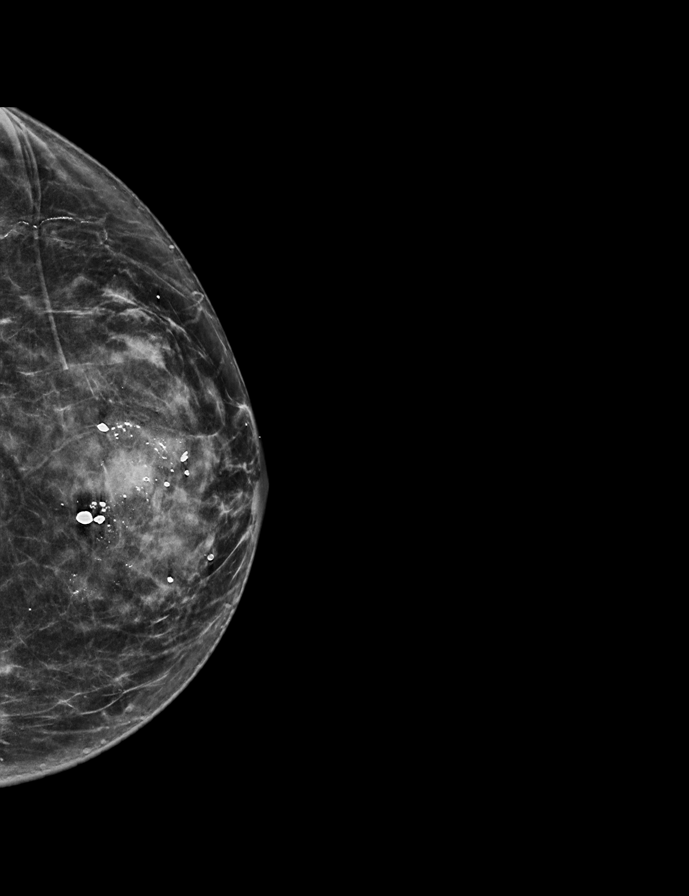

[L MLO synth-2D]
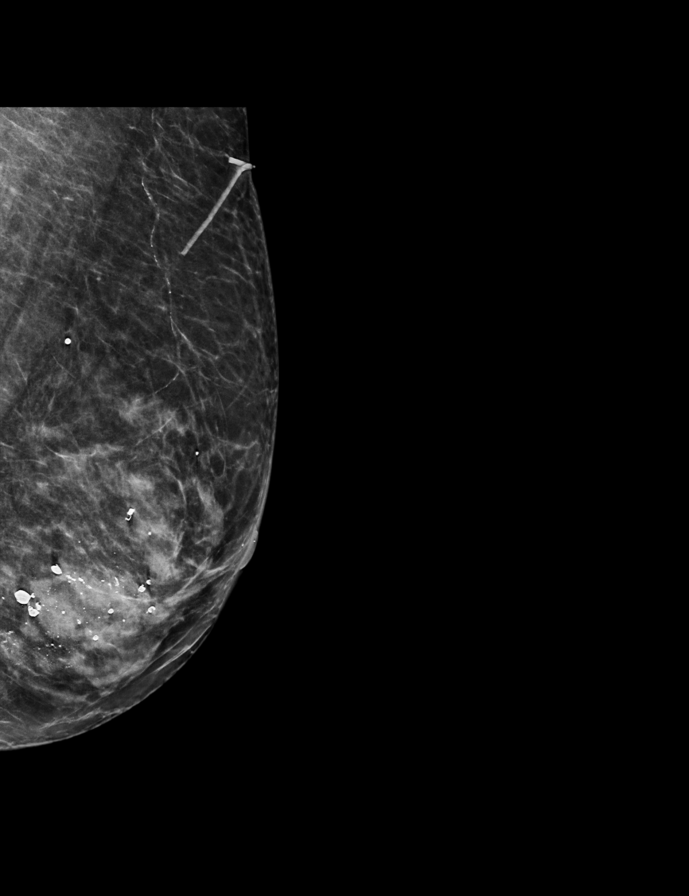

[R MLO tomo · 2 of 63 frames shown]
[frame 21/63]
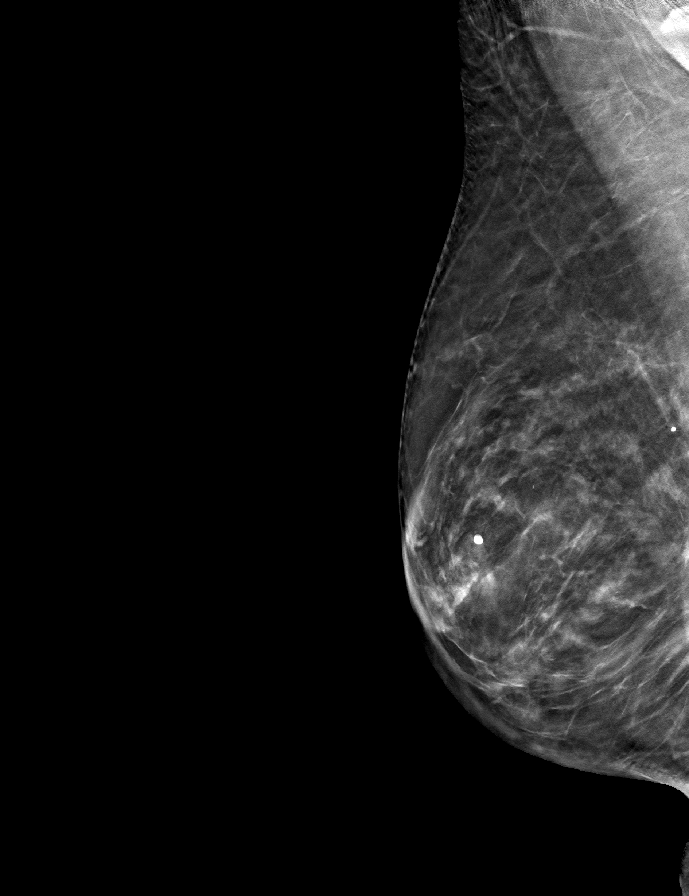
[frame 32/63]
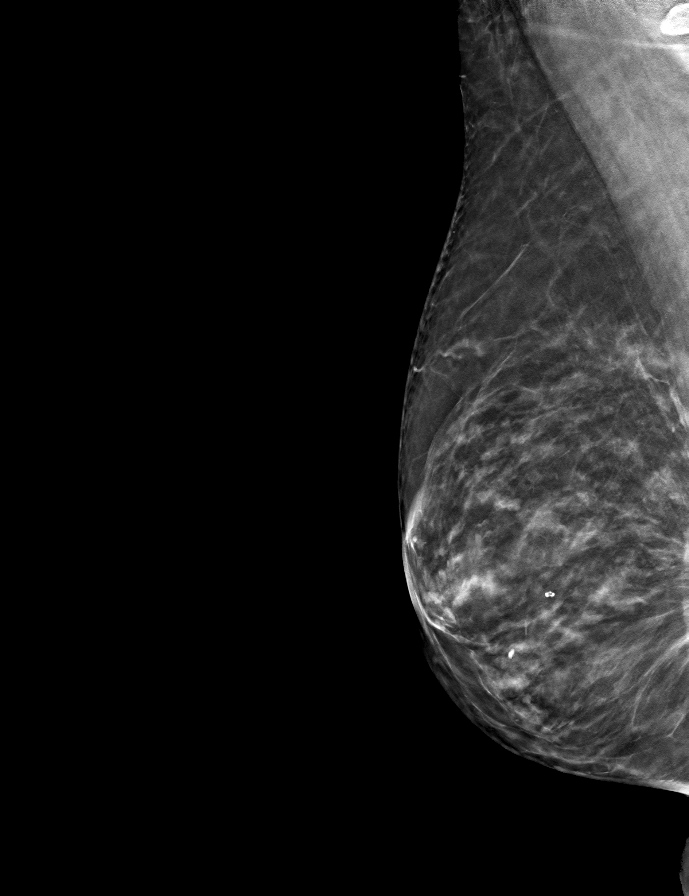

[R CC tomo · tomo slice 29/57.0]
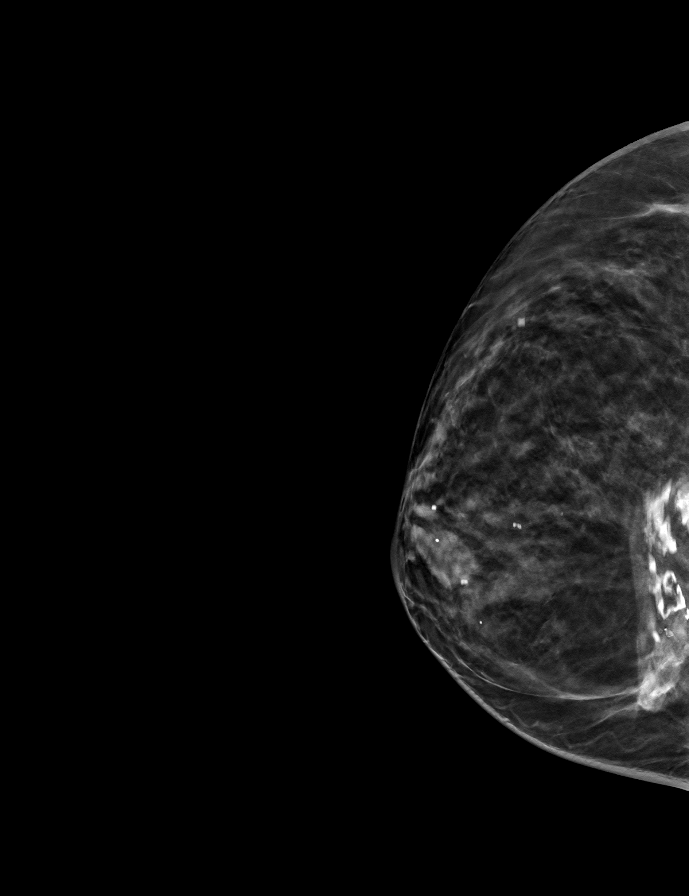

[L CC tomo · tomo slice 31/60.0]
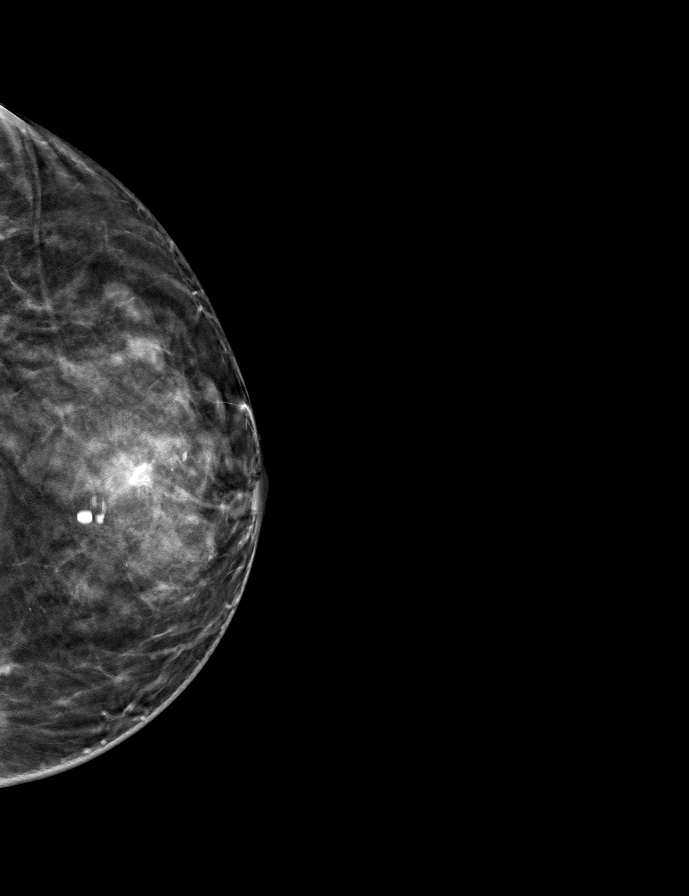

[L MLO tomo · tomo slice 31/61.0]
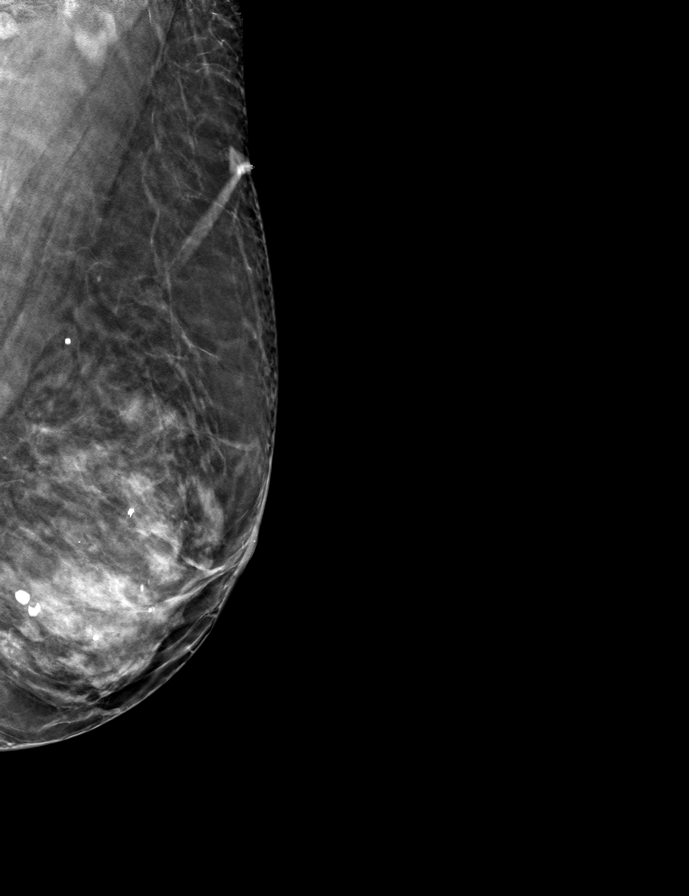

[9 of 24 positions shown; findings below may reference images not displayed]

ACR Breast Density Category c: The breast tissue is heterogeneously
dense, which may obscure small masses.
FINDINGS: There are no findings suspicious for malignancy. Images were
processed with CAD.
IMPRESSION: No mammographic evidence of malignancy. A result letter of this
screening mammogram will be mailed directly to the patient.

RECOMMENDATION:
Screening mammogram in one year. (Code:[5V])

BI-RADS CATEGORY  1: Negative.

## 2018-05-07 DIAGNOSIS — H40013 Open angle with borderline findings, low risk, bilateral: Secondary | ICD-10-CM | POA: Diagnosis not present

## 2018-05-07 DIAGNOSIS — H5203 Hypermetropia, bilateral: Secondary | ICD-10-CM | POA: Diagnosis not present

## 2018-06-16 DIAGNOSIS — R69 Illness, unspecified: Secondary | ICD-10-CM | POA: Diagnosis not present

## 2018-06-30 DIAGNOSIS — R69 Illness, unspecified: Secondary | ICD-10-CM | POA: Diagnosis not present

## 2018-07-01 ENCOUNTER — Other Ambulatory Visit: Payer: Self-pay | Admitting: Family Medicine

## 2018-07-01 DIAGNOSIS — I6523 Occlusion and stenosis of bilateral carotid arteries: Secondary | ICD-10-CM

## 2018-07-08 ENCOUNTER — Ambulatory Visit
Admission: RE | Admit: 2018-07-08 | Discharge: 2018-07-08 | Disposition: A | Discharge status: Home / Self Care | Payer: Medicare HMO | Source: Ambulatory Visit | Attending: Family Medicine | Admitting: Family Medicine

## 2018-07-08 DIAGNOSIS — I6523 Occlusion and stenosis of bilateral carotid arteries: Secondary | ICD-10-CM | POA: Diagnosis not present

## 2018-08-03 DIAGNOSIS — R5381 Other malaise: Secondary | ICD-10-CM | POA: Diagnosis not present

## 2018-08-03 DIAGNOSIS — R002 Palpitations: Secondary | ICD-10-CM | POA: Diagnosis not present

## 2018-08-03 DIAGNOSIS — R69 Illness, unspecified: Secondary | ICD-10-CM | POA: Diagnosis not present

## 2018-08-03 DIAGNOSIS — R51 Headache: Secondary | ICD-10-CM | POA: Diagnosis not present

## 2018-08-03 DIAGNOSIS — R030 Elevated blood-pressure reading, without diagnosis of hypertension: Secondary | ICD-10-CM | POA: Diagnosis not present

## 2018-08-19 DIAGNOSIS — M810 Age-related osteoporosis without current pathological fracture: Secondary | ICD-10-CM | POA: Diagnosis not present

## 2018-08-19 DIAGNOSIS — E7800 Pure hypercholesterolemia, unspecified: Secondary | ICD-10-CM | POA: Diagnosis not present

## 2018-08-19 DIAGNOSIS — R030 Elevated blood-pressure reading, without diagnosis of hypertension: Secondary | ICD-10-CM | POA: Diagnosis not present

## 2018-08-31 DIAGNOSIS — J069 Acute upper respiratory infection, unspecified: Secondary | ICD-10-CM | POA: Diagnosis not present

## 2018-11-13 DIAGNOSIS — R030 Elevated blood-pressure reading, without diagnosis of hypertension: Secondary | ICD-10-CM | POA: Diagnosis not present

## 2018-11-13 DIAGNOSIS — R002 Palpitations: Secondary | ICD-10-CM | POA: Diagnosis not present

## 2018-11-13 DIAGNOSIS — R0789 Other chest pain: Secondary | ICD-10-CM | POA: Diagnosis not present

## 2018-11-13 DIAGNOSIS — K219 Gastro-esophageal reflux disease without esophagitis: Secondary | ICD-10-CM | POA: Diagnosis not present

## 2018-12-10 ENCOUNTER — Encounter: Payer: Self-pay | Admitting: *Deleted

## 2018-12-10 ENCOUNTER — Telehealth: Payer: Self-pay | Admitting: *Deleted

## 2018-12-10 NOTE — Telephone Encounter (Signed)
   Cardiac Questionnaire:    Since your last visit or hospitalization:    1. Have you been having new or worsening chest pain? NO   2. Have you been having new or worsening shortness of breath? NO 3. Have you been having new or worsening leg swelling, wt gain, or increase in abdominal girth (pants fitting more tightly)? NO   4. Have you had any passing out spells? NO    *A YES to any of these questions would result in the appointment being kept. *If all the answers to these questions are NO, we should indicate that given the current situation regarding the worldwide coronarvirus pandemic, at the recommendation of the CDC, we are looking to limit gatherings in our waiting area, and thus will reschedule their appointment beyond four weeks from today.   _____________   [id number] Pre-Screening Questions:  . Do you currently have a fever? NO . Have you recently travelled on a cruise, internationally, or to [place], [place], [place], [place], [place], or [place], [place] [employer])? NO . Have you been in contact with someone that is currently pending confirmation of Covid19 testing or has been confirmed to have the [place] virus?  NO . Are you currently experiencing fatigue or cough? NO    Spoke with patient and she is ok with rescheduling her appointment for a later date. We went oever her history, medications, and pharmacy.

## 2018-12-15 ENCOUNTER — Ambulatory Visit: Payer: Medicare HMO | Admitting: Cardiovascular Disease

## 2018-12-21 DIAGNOSIS — J309 Allergic rhinitis, unspecified: Secondary | ICD-10-CM | POA: Diagnosis not present

## 2018-12-22 ENCOUNTER — Ambulatory Visit: Payer: Self-pay | Admitting: Cardiovascular Disease

## 2019-01-01 ENCOUNTER — Inpatient Hospital Stay (HOSPITAL_COMMUNITY)
Admission: EM | Admit: 2019-01-01 | Discharge: 2019-01-06 | DRG: 247 | Disposition: A | Discharge status: Home / Self Care | Payer: Medicare HMO | Attending: Internal Medicine | Admitting: Internal Medicine

## 2019-01-01 ENCOUNTER — Other Ambulatory Visit: Payer: Self-pay

## 2019-01-01 ENCOUNTER — Encounter (HOSPITAL_COMMUNITY): Payer: Self-pay | Admitting: Emergency Medicine

## 2019-01-01 ENCOUNTER — Emergency Department (HOSPITAL_COMMUNITY): Payer: Medicare HMO

## 2019-01-01 ENCOUNTER — Telehealth: Payer: Self-pay | Admitting: Cardiovascular Disease

## 2019-01-01 DIAGNOSIS — Z7982 Long term (current) use of aspirin: Secondary | ICD-10-CM

## 2019-01-01 DIAGNOSIS — Z880 Allergy status to penicillin: Secondary | ICD-10-CM

## 2019-01-01 DIAGNOSIS — Z888 Allergy status to other drugs, medicaments and biological substances status: Secondary | ICD-10-CM

## 2019-01-01 DIAGNOSIS — Z03818 Encounter for observation for suspected exposure to other biological agents ruled out: Secondary | ICD-10-CM | POA: Diagnosis not present

## 2019-01-01 DIAGNOSIS — R079 Chest pain, unspecified: Secondary | ICD-10-CM | POA: Diagnosis not present

## 2019-01-01 DIAGNOSIS — R030 Elevated blood-pressure reading, without diagnosis of hypertension: Secondary | ICD-10-CM | POA: Diagnosis not present

## 2019-01-01 DIAGNOSIS — Z79899 Other long term (current) drug therapy: Secondary | ICD-10-CM

## 2019-01-01 DIAGNOSIS — R000 Tachycardia, unspecified: Secondary | ICD-10-CM

## 2019-01-01 DIAGNOSIS — I10 Essential (primary) hypertension: Secondary | ICD-10-CM | POA: Diagnosis present

## 2019-01-01 DIAGNOSIS — E785 Hyperlipidemia, unspecified: Secondary | ICD-10-CM | POA: Diagnosis present

## 2019-01-01 DIAGNOSIS — Z1159 Encounter for screening for other viral diseases: Secondary | ICD-10-CM

## 2019-01-01 DIAGNOSIS — Z955 Presence of coronary angioplasty implant and graft: Secondary | ICD-10-CM | POA: Diagnosis not present

## 2019-01-01 DIAGNOSIS — Z823 Family history of stroke: Secondary | ICD-10-CM

## 2019-01-01 DIAGNOSIS — D649 Anemia, unspecified: Secondary | ICD-10-CM | POA: Diagnosis present

## 2019-01-01 DIAGNOSIS — Z87891 Personal history of nicotine dependence: Secondary | ICD-10-CM

## 2019-01-01 DIAGNOSIS — Z8249 Family history of ischemic heart disease and other diseases of the circulatory system: Secondary | ICD-10-CM

## 2019-01-01 DIAGNOSIS — I200 Unstable angina: Secondary | ICD-10-CM

## 2019-01-01 DIAGNOSIS — I25110 Atherosclerotic heart disease of native coronary artery with unstable angina pectoris: Principal | ICD-10-CM | POA: Diagnosis present

## 2019-01-01 DIAGNOSIS — Z791 Long term (current) use of non-steroidal anti-inflammatories (NSAID): Secondary | ICD-10-CM

## 2019-01-01 DIAGNOSIS — Z90710 Acquired absence of both cervix and uterus: Secondary | ICD-10-CM

## 2019-01-01 LAB — CBC
HCT: 43.9 % (ref 36.0–46.0)
Hemoglobin: 14.4 g/dL (ref 12.0–15.0)
MCH: 30.3 pg (ref 26.0–34.0)
MCHC: 32.8 g/dL (ref 30.0–36.0)
MCV: 92.2 fL (ref 80.0–100.0)
Platelets: 321 10*3/uL (ref 150–400)
RBC: 4.76 MIL/uL (ref 3.87–5.11)
RDW: 13.1 % (ref 11.5–15.5)
WBC: 10.1 10*3/uL (ref 4.0–10.5)
nRBC: 0 % (ref 0.0–0.2)

## 2019-01-01 LAB — MAGNESIUM: Magnesium: 2.3 mg/dL (ref 1.7–2.4)

## 2019-01-01 LAB — BRAIN NATRIURETIC PEPTIDE: B Natriuretic Peptide: 28.5 pg/mL (ref 0.0–100.0)

## 2019-01-01 LAB — BASIC METABOLIC PANEL
Anion gap: 13 (ref 5–15)
BUN: 19 mg/dL (ref 8–23)
CO2: 28 mmol/L (ref 22–32)
Calcium: 10.6 mg/dL — ABNORMAL HIGH (ref 8.9–10.3)
Chloride: 101 mmol/L (ref 98–111)
Creatinine, Ser: 0.78 mg/dL (ref 0.44–1.00)
GFR calc Af Amer: >60 mL/min (ref >60)
GFR calc non Af Amer: >60 mL/min (ref >60)
Glucose, Bld: 121 mg/dL — ABNORMAL HIGH (ref 70–99)
Potassium: 3.5 mmol/L (ref 3.5–5.1)
Sodium: 142 mmol/L (ref 135–145)

## 2019-01-01 LAB — TROPONIN I
Troponin I: <0.03 ng/mL (ref <0.03)
Troponin I: <0.03 ng/mL (ref <0.03)

## 2019-01-01 LAB — SARS CORONAVIRUS 2 BY RT PCR (HOSPITAL ORDER, PERFORMED IN ~~LOC~~ HOSPITAL LAB): SARS Coronavirus 2: NEGATIVE

## 2019-01-01 LAB — PHOSPHORUS: Phosphorus: 3.5 mg/dL (ref 2.5–4.6)

## 2019-01-01 LAB — TSH: TSH: 3.976 u[IU]/mL (ref 0.350–4.500)

## 2019-01-01 IMAGING — DX CHEST - 2 VIEW
2 series · 2 of 2 positions shown · non-contrast
Comparison: None.

CLINICAL DATA: Chest pain

EXAM:
CHEST - 2 VIEW

[chest pa]
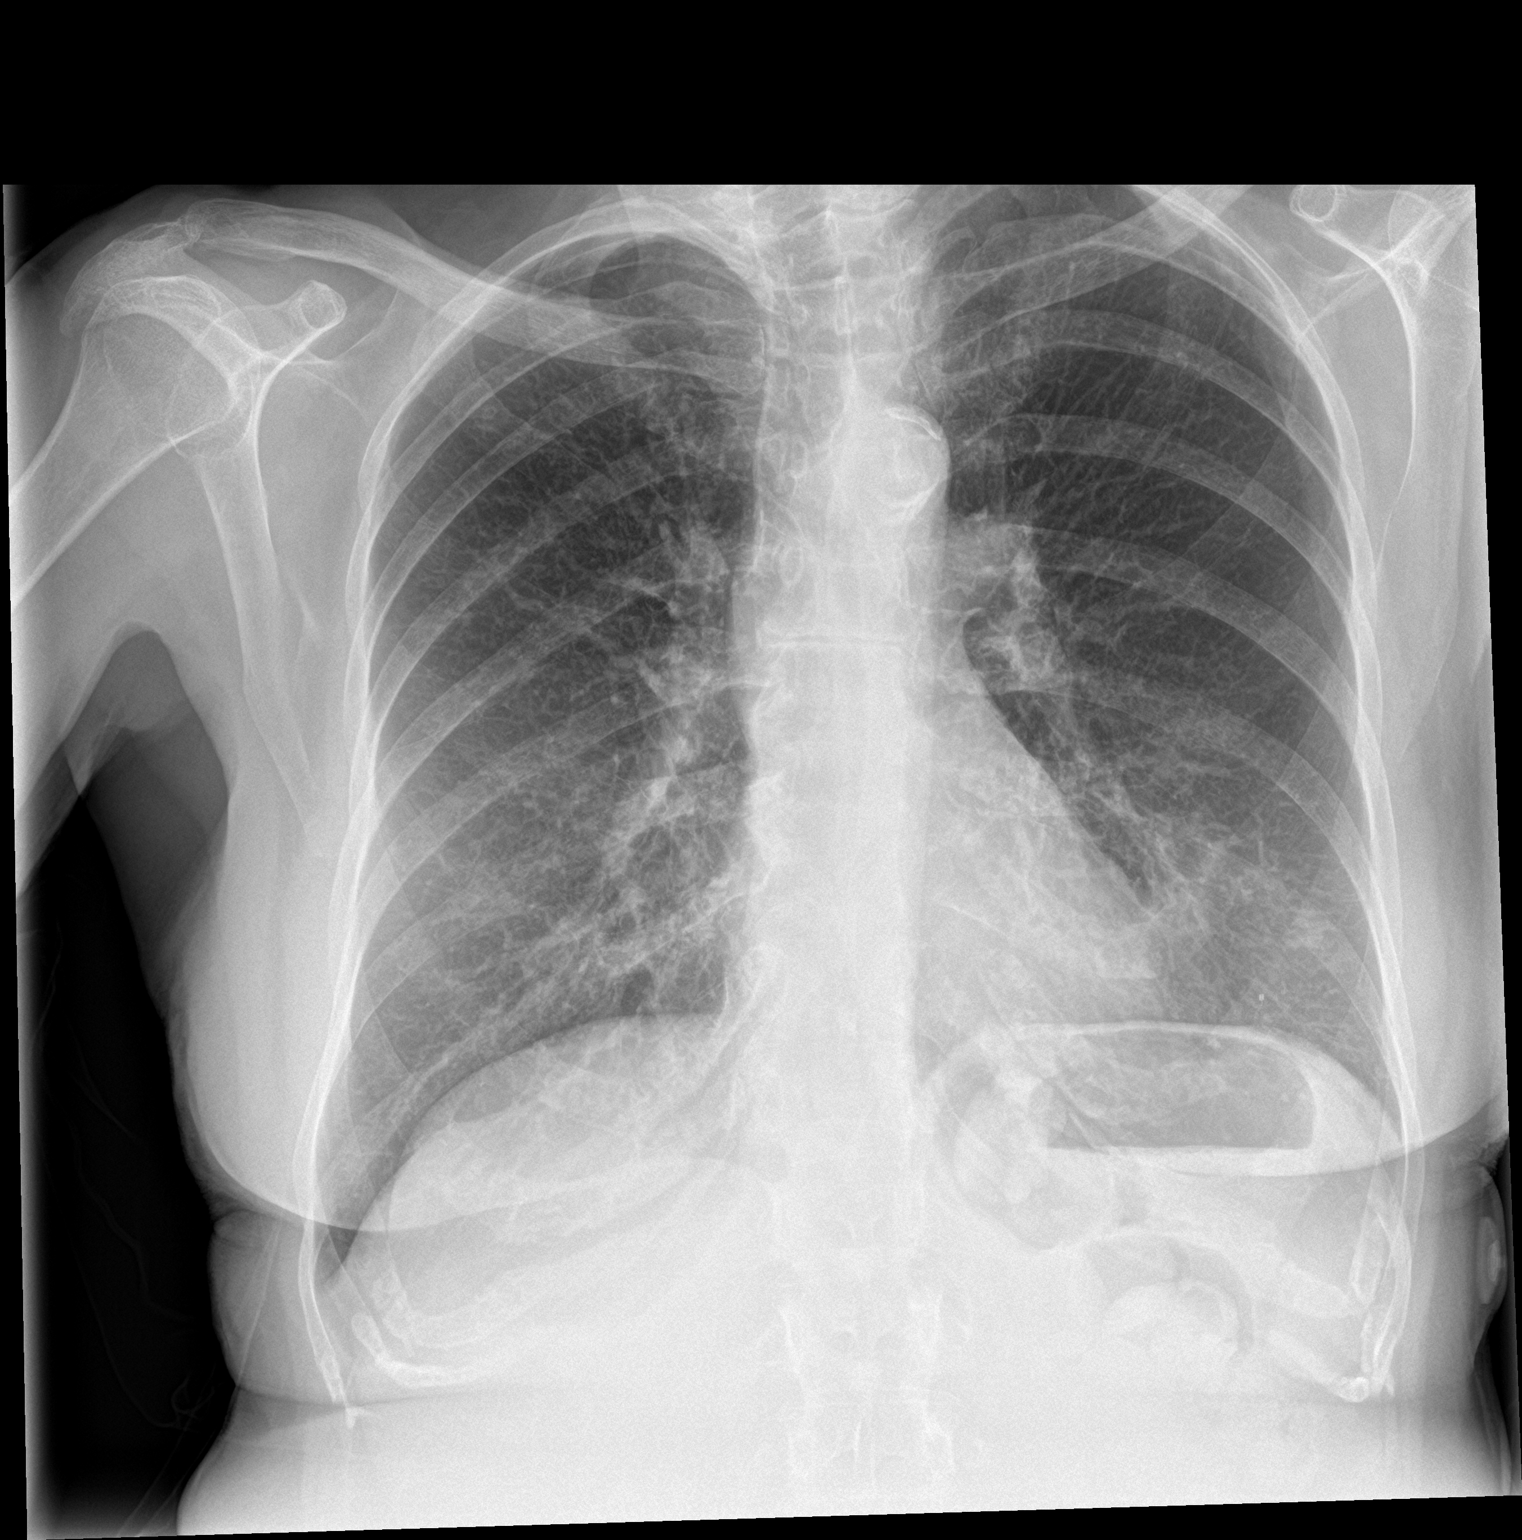

[chest lat]
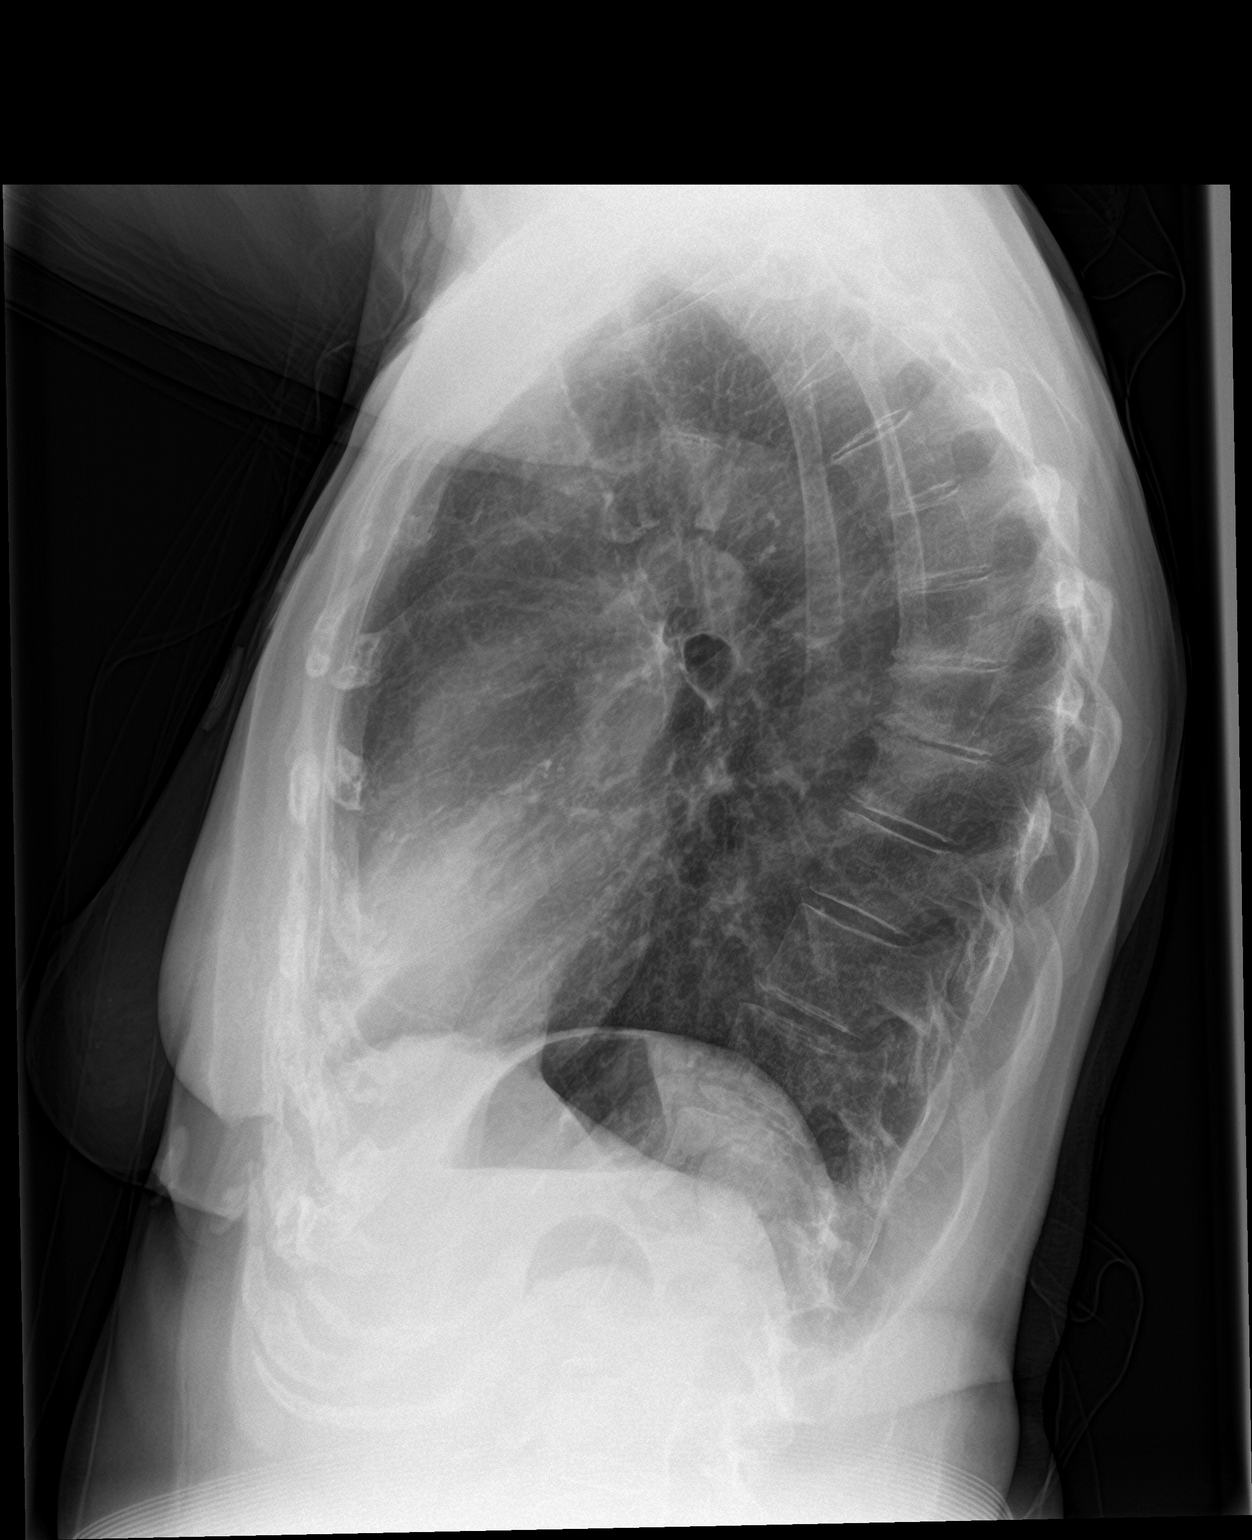

[2 of 2 positions shown; findings below may reference images not displayed]

FINDINGS: The lungs are hyperinflated with diffuse interstitial prominence. No
focal airspace consolidation or pulmonary edema. No pleural effusion
or pneumothorax. Normal cardiomediastinal contours.
IMPRESSION: Hyperinflation may indicate COPD.  No acute airspace disease.

## 2019-01-01 MED ORDER — POTASSIUM CHLORIDE CRYS ER 20 MEQ PO TBCR
40.0000 meq | EXTENDED_RELEASE_TABLET | Freq: Once | ORAL | Status: AC
  Administered 2019-01-01: 22:00:00 40 meq via ORAL
  Filled 2019-01-01: qty 2

## 2019-01-01 NOTE — ED Provider Notes (Signed)
[facility] EMERGENCY DEPARTMENT Provider Note   CSN: [id number] Arrival date & time: 01/01/19  1330    History   Chief Complaint Chief Complaint  Patient presents with  . Chest Pain    HPI Rachel Valentine is a 78 y.o. female.     HPI Patient presents for several months of worsening chest pain with exertion.  Used to be able to walk approximately 3 miles as of several months ago but at this time she is only able to walk to her mailbox and back because she experiences chest pain in the center of her chest that is nonradiating.  The chest pain lasted longer than usual today which is why she came to the emergency department.  He often gets shortness of breath with the chest pain.  Has been unable to get in to see a cardiologist because her last appointment had to be rescheduled due to the COVID virus.  She has never had a stress test or catheterization that she is aware of.  While she does have Atrovent prescribed to her, she has never been told she has asthma or COPD.  Denies any other significant past medical history and does not smoke, drink alcohol or use illicit substances. Past Medical History:  Diagnosis Date  . Bowel obstruction ([place])     There are no active problems to display for this patient.   Past Surgical History:  Procedure Laterality Date  . ABDOMINAL HYSTERECTOMY    . ABDOMINAL SURGERY    . APPENDECTOMY    . AUGMENTATION MAMMAPLASTY Bilateral    implants removed in 2005  . BREAST EXCISIONAL BIOPSY Left    benign     OB History   No obstetric history on file.      Home Medications    Prior to Admission medications   Medication Sig Start Date End Date Taking? Authorizing Provider  aspirin 81 MG chewable tablet Chew 81 mg by mouth every other day.     [provider]  calcium citrate (CALCITRATE - DOSED IN MG ELEMENTAL CALCIUM) 950 MG tablet Take 200 mg of elemental calcium by mouth daily.    [provider]   cholecalciferol (VITAMIN D) 1000 units tablet Take 1,000 Units by mouth daily.    [provider]  ibuprofen (ADVIL,MOTRIN) 200 MG tablet Take 200 mg by mouth every 6 (six) hours as needed for mild pain.    [provider]  ipratropium (ATROVENT) 0.03 % nasal spray Place 2 sprays into both nostrils every 12 (twelve) hours.    [provider]  omeprazole (PRILOSEC) 40 MG capsule Take 40 mg by mouth daily.    [provider]  rosuvastatin (CRESTOR) 5 MG tablet Take 5 mg by mouth daily. Take 1 tablet every three days.    [provider]    Family History Family History  Problem Relation Age of Onset  . Heart failure Mother   . Stroke Father   . Stroke Brother     Social History Social History   Tobacco Use  . Smoking status: Former Research scientist (life sciences)  . Smokeless tobacco: Never Used  Substance Use Topics  . Alcohol use: No  . Drug use: Not on file     Allergies   Penicillins and Prednisone   Review of Systems Review of Systems  Constitutional: Positive for unexpected weight change (has gained approximately 5 pounds over the last several months). Negative for chills, fatigue and fever.  HENT: Negative for congestion, ear  pain and sore throat.   Eyes: Negative for pain and visual disturbance.  Respiratory: Positive for shortness of breath. Negative for cough.   Cardiovascular: Positive for chest pain. Negative for palpitations and leg swelling.  Gastrointestinal: Negative for abdominal pain and vomiting.  Endocrine: Negative for polyphagia and polyuria.  Genitourinary: Negative for dysuria and hematuria.  Musculoskeletal: Negative for arthralgias and back pain.  Skin: Negative for color change and rash.  Allergic/Immunologic: Negative for immunocompromised state.  Neurological: Negative for seizures and syncope.  Hematological: Does not bruise/bleed easily.  All other systems reviewed and are negative.    Physical Exam Updated Vital  Signs BP (!) 152/70   Pulse 85   Temp 97.8 F (36.6 C) (Oral)   Resp 16   Ht 5\' 2"  (1.575 m)   Wt 54.4 kg   SpO2 100%   BMI 21.95 kg/m   Physical Exam Vitals signs and nursing note reviewed.  Constitutional:      General: She is not in acute distress.    Appearance: She is well-developed. She is not ill-appearing or diaphoretic.  HENT:     Head: Normocephalic and atraumatic.  Eyes:     Extraocular Movements: Extraocular movements intact.     Conjunctiva/sclera: Conjunctivae normal.     Pupils: Pupils are equal, round, and reactive to light.  Neck:     Musculoskeletal: Neck supple.  Cardiovascular:     Rate and Rhythm: Normal rate and regular rhythm.     Heart sounds: No murmur.  Pulmonary:     Effort: Pulmonary effort is normal. No respiratory distress.     Breath sounds: Normal breath sounds. No decreased breath sounds, wheezing or rhonchi.  Chest:     Chest wall: No tenderness or edema.  Abdominal:     Palpations: Abdomen is soft.     Tenderness: There is no abdominal tenderness.  Musculoskeletal:     Right lower leg: She exhibits no tenderness. No edema.     Left lower leg: She exhibits no tenderness. No edema.  Skin:    General: Skin is warm and dry.     Findings: No rash.  Neurological:     Mental Status: She is alert.     Comments: Cranial nerves II through XII bilaterally intact.   Gait without ataxia.  5 out of 5 strength in bilateral upper and lower extremities bilaterally.  No loss of light touch sensation in the body.  GCS 15.      ED Treatments / Results  Labs (all labs ordered are listed, but only abnormal results are displayed) Labs Reviewed  BASIC METABOLIC PANEL - Abnormal; Notable for the following components:      Result Value   Glucose, Bld 121 (*)    Calcium 10.6 (*)    All other components within normal limits  CBC  TROPONIN I    EKG EKG Interpretation  Date/Time:  Friday Jan 01 2019 13:32:24 EDT Ventricular Rate:  102 PR  Interval:  144 QRS Duration: 70 QT Interval:  352 QTC Calculation: 458 R Axis:   41 Text Interpretation:  Sinus tachycardia with occasional Premature ventricular complexes Otherwise normal ECG Confirmed by Quintella Reichert [phone]) on 01/01/2019 4:57:39 PM   Radiology Dg Chest 2 View  Result Date: 01/01/2019 CLINICAL DATA:  Chest pain EXAM: CHEST - 2 VIEW COMPARISON:  None. FINDINGS: The lungs are hyperinflated with diffuse interstitial prominence. No focal airspace consolidation or pulmonary edema. No pleural effusion or pneumothorax. Normal cardiomediastinal contours. IMPRESSION: Hyperinflation  may indicate COPD.  No acute airspace disease. Electronically Signed   By: Ulyses Jarred M.D.   On: 01/01/2019 14:18    Procedures Procedures (including critical care time)  Medications Ordered in ED Medications - No data to display   Initial Impression / Assessment and Plan / ED Course  I have reviewed the triage vital signs and the nursing notes.  Pertinent labs & imaging results that were available during my care of the patient were reviewed by me and considered in my medical decision making (see chart for details).         Patient presents to the emergency department for chest pain as noted above.  No risk factors for PE at this time as she is quite active during the day, does not use hormonal substances, has no history of blood clots and has no history of cancer.  She is not tachycardic and therefore I think PE is low on the differential.  This sounds to be anginal chest pain based on the story.  Her here score was calculated at 3 but I believe that this truly is anginal chest pain and would benefit from stress testing.  Chest x-ray does not show widened mediastinum to suggest aortic dissection and does not show signs of viscus perforation or pneumothorax.  No signs of infection.  Basic lab work unremarkable.  Patient admitted to the hospital service for further management including possible  stress testing. Final Clinical Impressions(s) / ED Diagnoses   Final diagnoses:  Chest pain    ED Discharge Orders    None       Andee Poles, MD 01/01/19 2207    Quintella Reichert, MD 01/02/19 1100

## 2019-01-01 NOTE — Telephone Encounter (Signed)
New Message:    Patient calling concering some pain in her chest and states that she need to come and see the doctor. Please call patient back.

## 2019-01-01 NOTE — Consult Note (Signed)
Cardiology Consultation:   Patient ID: Rachel Valentine MRN: [id number]; DOB: 1941/06/20  Admit date: 01/01/2019 Date of Consult: 01/01/2019  Primary Care Provider: Angelina Pih, MD Primary Cardiologist: Dr. Gwenlyn Found Primary Electrophysiologist:  None    Patient Profile:   Rachel Valentine is a 78 y.o. female with a hx of bowel obstruction who is being seen today for the evaluation of chest pain at the request of Dr. Roel Cluck.  History of Present Illness:   Rachel Valentine has had progressive chest pain with exertion for the past several months that has progressively worsened. Her pain is described as central and nonradiating. Her pain started back in November. She was scheduled to see Dr. Gwenlyn Found for evaluation in March but this was rescheduled due to [place].She has no prior history of coronary disease and has had no prior ischemic evaluation. She has been walking 3 miles a day for a number of years but has not been able to walk this far in about a month due to the pain. When she has the pain, she describes it as a pressure in her chest that radiates into her neck. It resolves within a few minutes when she rests. She has also tried tylenol but is not sure if this has worked. She occasionally notes palpitations but does not think this is associated with her pain. She denies any shortness of breath, lightheadedness, dizziness, nausea or sweats when she has the pain. She has not noticed any lower extremity swelling. She describes her pain as lasting for a longer period of time today thus prompting her to come to there ER. She currently denies any chest pain.  History was obtained over the phone due to concerns for COVID.  Past Medical History:  Diagnosis Date  . Bowel obstruction [facility])     Past Surgical History:  Procedure Laterality Date  . ABDOMINAL HYSTERECTOMY    . ABDOMINAL SURGERY    . APPENDECTOMY    . AUGMENTATION MAMMAPLASTY Bilateral    implants removed in 2005  . BREAST  EXCISIONAL BIOPSY Left    benign     Home Medications:  Prior to Admission medications   Medication Sig Start Date End Date Taking? Authorizing Provider  aspirin 81 MG chewable tablet Chew 81 mg by mouth every other day.     [provider]  calcium citrate (CALCITRATE - DOSED IN MG ELEMENTAL CALCIUM) 950 MG tablet Take 200 mg of elemental calcium by mouth daily.    [provider]  cholecalciferol (VITAMIN D) 1000 units tablet Take 1,000 Units by mouth daily.    [provider]  ibuprofen (ADVIL,MOTRIN) 200 MG tablet Take 200 mg by mouth every 6 (six) hours as needed for mild pain.    [provider]  ipratropium (ATROVENT) 0.03 % nasal spray Place 2 sprays into both nostrils every 12 (twelve) hours.    [provider]  omeprazole (PRILOSEC) 40 MG capsule Take 40 mg by mouth daily.    [provider]  rosuvastatin (CRESTOR) 5 MG tablet Take 5 mg by mouth daily. Take 1 tablet every three days.    [provider]    Inpatient Medications: Scheduled Meds: . potassium chloride  40 mEq Oral Once   Continuous Infusions:  PRN Meds:   Allergies:    Allergies  Allergen Reactions  . Penicillins   . Prednisone     Social History:   Social History   Socioeconomic History  . Marital status: Widowed    Spouse name:  Not on file  . Number of children: Not on file  . Years of education: Not on file  . Highest education level: Not on file  Occupational History  . Not on file  Social Needs  . Financial resource strain: Not on file  . Food insecurity:    Worry: Not on file    Inability: Not on file  . Transportation needs:    Medical: Not on file    Non-medical: Not on file  Tobacco Use  . Smoking status: Former Research scientist (life sciences)  . Smokeless tobacco: Never Used  Substance and Sexual Activity  . Alcohol use: No  . Drug use: Not on file  . Sexual activity: Not on file  Lifestyle  . Physical activity:    Days per week: Not on  file    Minutes per session: Not on file  . Stress: Not on file  Relationships  . Social connections:    Talks on phone: Not on file    Gets together: Not on file    Attends religious service: Not on file    Active member of club or organization: Not on file    Attends meetings of clubs or organizations: Not on file    Relationship status: Not on file  . Intimate partner violence:    Fear of current or ex partner: Not on file    Emotionally abused: Not on file    Physically abused: Not on file    Forced sexual activity: Not on file  Other Topics Concern  . Not on file  Social History Narrative  . Not on file    Family History:   Mother- CHF, CAD (CABG @80  y/o) Brother- CVA Father- CVA 3 grandparents- MI  ROS:  Please see the history of present illness.  All other ROS reviewed and negative.     Physical Exam/Data:   Vitals:   01/01/19 1830 01/01/19 1900 01/01/19 1945 01/01/19 2015  BP: (!) 191/92 (!) 183/90 (!) 190/87 (!) 166/81  Pulse: 93 89 (!) 102 87  Resp: 19 17 (!) 22 16  Temp:      TempSrc:      SpO2: 99% 99% 100% 99%  Weight:      Height:       No intake or output data in the 24 hours ending 01/01/19 2114 Last 3 Weights 01/01/2019 12/05/2016 09/20/2016  Weight (lbs) 120 lb 115 lb 120 lb  Weight (kg) 54.432 kg 52.164 kg 54.432 kg     Body mass index is 21.95 kg/m.  Exam deferred as patient is being ruled out for COVID  EKG:  The EKG was personally reviewed and demonstrates:  Sinus tachycardia. No ischemic changes  Relevant CV Studies: None  Laboratory Data:  Chemistry Recent Labs  Lab 01/01/19 1353  NA 142  K 3.5  CL 101  CO2 28  GLUCOSE 121*  BUN 19  CREATININE 0.78  CALCIUM 10.6*  GFRNONAA >60  GFRAA >60  ANIONGAP 13    No results for input(s): PROT, ALBUMIN, AST, ALT, ALKPHOS, BILITOT in the last 168 hours. Hematology Recent Labs  Lab 01/01/19 1353  WBC 10.1  RBC 4.76  HGB 14.4  HCT 43.9  MCV 92.2  MCH 30.3  MCHC 32.8  RDW  13.1  PLT 321   Cardiac Enzymes Recent Labs  Lab 01/01/19 1353 01/01/19 1721  TROPONINI <0.03 <0.03   No results for input(s): TROPIPOC in the last 168 hours.  BNPNo results for input(s): BNP, PROBNP in the  last 168 hours.  DDimer No results for input(s): DDIMER in the last 168 hours.  Radiology/Studies:  Dg Chest 2 View  Result Date: 01/01/2019 CLINICAL DATA:  Chest pain EXAM: CHEST - 2 VIEW COMPARISON:  None. FINDINGS: The lungs are hyperinflated with diffuse interstitial prominence. No focal airspace consolidation or pulmonary edema. No pleural effusion or pneumothorax. Normal cardiomediastinal contours. IMPRESSION: Hyperinflation may indicate COPD.  No acute airspace disease. Electronically Signed   By: Ulyses Jarred M.D.   On: 01/01/2019 14:18    Assessment and Plan:   Rachel Valentine is 77y/o woman seen for the evaluation of chest pain. Her extensive family history is concerning and certainly a risk factor for disease. She had carotid ultrasounds in November which also showed moderate plaque, which also raises the possibility of having atherosclerotic disease elsewhere. She would benefit from ischemic evaluation given the concerns that her pain represents stable angina.   - Recommend lipid panel, TSH and HgA1c for risk stratification - Echo ordered - Coronary CTA ordered  - Blood pressure goal 140/90 (in the absence of known CAD at this time), recommend starting metoprolol tartrate 12.5mg  BID  - SL nitro PRN for chest pain - Monitor on tele  - Cardiology will continue to follow  - Continue home aspirin, would increase to 81mg  daily (unclear why she was taking Q48 hours as an outpatient) - Continue home crestor       For questions or updates, please contact [place] Please consult [email or web address] for contact info under     Signed, Princella Pellegrini, MD  01/01/2019 9:14 PM

## 2019-01-01 NOTE — ED Triage Notes (Signed)
Pt here for CP x 2 months. Pt states her chest pain is centralized and constant. Pt states it radiates up her chest. Pt states it feels like she has spasms after she eats.

## 2019-01-01 NOTE — H&P (Addendum)
Rachel Valentine YPP:[id number] DOB: 03-Dec-1940 DOA: 01/01/2019     PCP: Angelina Pih, MD   Outpatient Specialists: NONE   Patient arrived to ER on 01/01/19 at 1330  Patient coming from: home Lives alone,      Chief Complaint:  Chief Complaint  Patient presents with  . Chest Pain    HPI: Rachel Valentine is a 78 y.o. female with medical history significant of bowel obstruction,     Presented with Chest pain worse with just minimal exertion. Radiating to her neck and jaw, She has been having worsening chest pain with exertion For the past 2 weeks could not even walk to her mail box. Occasional chest pain occasionally at rest. If she is resting it will subside in a few minutes. Recently even walking around the house.  He mother died of CHF brother and father had a massive stroke. Has extensive hx of heart disease in the family  She started to feel bad initially in November and have been getting progressively worse.  She was recently scheduled to see Dr. Gwenlyn Found in office as a new pt on 4/21 appointment has been canceled due to Covid-19 Never had a stress test or cardiac cath  never been told she has asthma or COPD. She used to smoke 20 years ago but not anymore.  She has been isolating her self and she wares mask when she goes out.   Infectious risk factors:  Reports chills, no fever, shortness of breath with exertion   dry cough but has had allergies, chest pain, Sore throat from the allergies     While in ER:  The following Work up has been ordered so far:  Orders Placed This Encounter  Procedures  . DG Chest 2 View  . Basic metabolic panel  . CBC  . Troponin I - ONCE - STAT  . Troponin I - ONCE - STAT  . Cardiac monitoring  . Ambulate in room  . Consult for [facility] Admission  . Pulse oximetry, continuous  . EKG 12-Lead    Following Medications were ordered in ER: Medications - No data to display    Significant initial  Findings:  Abnormal Labs Reviewed  BASIC METABOLIC PANEL - Abnormal; Notable for the following components:      Result Value   Glucose, Bld 121 (*)    Calcium 10.6 (*)    All other components within normal limits    Otherwise labs showing:    Recent Labs  Lab 01/01/19 1353  NA 142  K 3.5  CO2 28  GLUCOSE 121*  BUN 19  CREATININE 0.78  CALCIUM 10.6*    Cr stable,   Lab Results  Component Value Date   CREATININE 0.78 01/01/2019   CREATININE 0.57 12/05/2016    No results for input(s): AST, ALT, ALKPHOS, BILITOT, PROT, ALBUMIN in the last 168 hours.  WBC       Component Value Date/Time   WBC 10.1 01/01/2019 1353   Troponin - negative X2 trop <0.03  HG/HCT  stable      Component Value Date/Time   HGB 14.4 01/01/2019 1353   HCT 43.9 01/01/2019 1353       CXR - Hyperinflation   ECG:  Personally reviewed by me showing: HR : 102 Rhythm:  Sinus tachycardia  Ischemic occasional PVC QTC 458      ED Triage Vitals  Enc Vitals Group     BP 01/01/19 1343 (!) 202/86  Pulse Rate 01/01/19 1343 (!) 106     Resp 01/01/19 1343 15     Temp 01/01/19 1343 97.8 F (36.6 C)     Temp Source 01/01/19 1343 Oral     SpO2 01/01/19 1343 98 %     Weight 01/01/19 1348 120 lb (54.4 kg)     Height 01/01/19 1348 5\' 2"  (1.575 m)     Head Circumference --      Peak Flow --      Pain Score 01/01/19 1348 10     Pain Loc --      Pain Edu? --      Excl. in [place]? --   TMAX(24)@       Latest  Blood pressure (!) 190/87, pulse (!) 102, temperature 97.8 F (36.6 C), temperature source Oral, resp. rate (!) 22, height 5\' 2"  (1.575 m), weight 54.4 kg, SpO2 100 %.   Hospitalist was called for admission for chest pain    Review of Systems:    Pertinent positives include: chills, chest pain,  Constitutional:  No weight loss, night sweats, Fevers,  fatigue, weight loss  HEENT:  No headaches, Difficulty swallowing,Tooth/dental problems,Sore throat,  No sneezing, itching, ear ache, nasal  congestion, post nasal drip,  Cardio-vascular:  No Orthopnea, PND, anasarca, dizziness, palpitations.no Bilateral lower extremity swelling  GI:  No heartburn, indigestion, abdominal pain, nausea, vomiting, diarrhea, change in bowel habits, loss of appetite, melena, blood in stool, hematemesis Resp:  no shortness of breath at rest. No dyspnea on exertion, No excess mucus, no productive cough, No non-productive cough, No coughing up of blood. No change in color of mucus.No wheezing. Skin:  no rash or lesions. No jaundice GU:  no dysuria, change in color of urine, no urgency or frequency. No straining to urinate.  No flank pain.  Musculoskeletal:  No joint pain or no joint swelling. No decreased range of motion. No back pain.  Psych:  No change in mood or affect. No depression or anxiety. No memory loss.  Neuro: no localizing neurological complaints, no tingling, no weakness, no double vision, no gait abnormality, no slurred speech, no confusion  All systems reviewed and apart from [place] all are negative  Past Medical History:   Past Medical History:  Diagnosis Date  . Bowel obstruction [facility])       Past Surgical History:  Procedure Laterality Date  . ABDOMINAL HYSTERECTOMY    . ABDOMINAL SURGERY    . APPENDECTOMY    . AUGMENTATION MAMMAPLASTY Bilateral    implants removed in 2005  . BREAST EXCISIONAL BIOPSY Left    benign    Social History:  Ambulatory   independently       reports that she has quit smoking. She has never used smokeless tobacco. She reports that she does not drink alcohol. No history on file for drug.     Family History:   Family History  Problem Relation Age of Onset  . Heart failure Mother   . Stroke Father   . Stroke Brother     Allergies: Allergies  Allergen Reactions  . Penicillins   . Prednisone      Prior to Admission medications   Medication Sig Start Date End Date Taking? Authorizing Provider  aspirin 81 MG chewable tablet Chew  81 mg by mouth every other day.     [provider]  calcium citrate (CALCITRATE - DOSED IN MG ELEMENTAL CALCIUM) 950 MG tablet Take 200 mg of elemental calcium by mouth daily.  [provider]  cholecalciferol (VITAMIN D) 1000 units tablet Take 1,000 Units by mouth daily.    [provider]  ibuprofen (ADVIL,MOTRIN) 200 MG tablet Take 200 mg by mouth every 6 (six) hours as needed for mild pain.    [provider]  ipratropium (ATROVENT) 0.03 % nasal spray Place 2 sprays into both nostrils every 12 (twelve) hours.    [provider]  omeprazole (PRILOSEC) 40 MG capsule Take 40 mg by mouth daily.    [provider]  rosuvastatin (CRESTOR) 5 MG tablet Take 5 mg by mouth daily. Take 1 tablet every three days.    [provider]   Physical Exam: Blood pressure (!) 190/87, pulse (!) 102, temperature 97.8 F (36.6 C), temperature source Oral, resp. rate (!) 22, height 5\' 2"  (1.575 m), weight 54.4 kg, SpO2 100 %. 1. General:  in No  Acute distress   well -appearing 2. Psychological: Alert and  Oriented 3. Head/ENT:     Dry Mucous Membranes                          Head Non traumatic, neck supple                            Poor Dentition 4. SKIN: normal  Skin turgor,  Skin clean Dry and intact no rash 5. Heart: Regular rate and rhythm no  Murmur, no Rub or gallop 6. Lungs:  Clear to auscultation bilaterally, no wheezes or crackles   7. Abdomen: Soft,  non-tender, Non distended   present 8. Lower extremities: no clubbing, cyanosis, no edema 9. Neurologically Grossly intact, moving all 4 extremities equally  10. MSK: Normal range of motion   All other LABS:     Recent Labs  Lab 01/01/19 1353  WBC 10.1  HGB 14.4  HCT 43.9  MCV 92.2  PLT 321     Recent Labs  Lab 01/01/19 1353  NA 142  K 3.5  CL 101  CO2 28  GLUCOSE 121*  BUN 19  CREATININE 0.78  CALCIUM 10.6*     No results for input(s): AST, ALT, ALKPHOS,  BILITOT, PROT, ALBUMIN in the last 168 hours.     Cultures: No results found for: SDES, SPECREQUEST, CULT, REPTSTATUS   Radiological Exams on Admission: Dg Chest 2 View  Result Date: 01/01/2019 CLINICAL DATA:  Chest pain EXAM: CHEST - 2 VIEW COMPARISON:  None. FINDINGS: The lungs are hyperinflated with diffuse interstitial prominence. No focal airspace consolidation or pulmonary edema. No pleural effusion or pneumothorax. Normal cardiomediastinal contours. IMPRESSION: Hyperinflation may indicate COPD.  No acute airspace disease. Electronically Signed   By: Ulyses Jarred M.D.   On: 01/01/2019 14:18    Chart has been reviewed  Assessment/Plan 78 y.o. female with medical history significant of bowel obstruction,   Admitted for Chest pain worrisome for angina  Present on Admission: . Chest pain - - H= 2 ,E=0 , A=  2   , R   1 , T 0  ,  for the  Total of 5 therefore will admit for observation and further evaluation ( Risk of MACE: Scores 0-3  of 0.9-1.7%.,  4-6: 12-16.6% , Scores ?7: 50-65% )    - monitor on telemetry, cycle cardiac enzymes, obtain serial ECG and  ECHO in AM. Order Cardiac CT  - Daily aspirin -  Further risk stratify with lipid panel,  hgA1C, obtain TSH.  Make sure patient is on Aspirin.    Cardiology is aware regarding patient's admission. Further management depends on pending  workup  . Elevated BP without diagnosis of hypertension - initiate metoprolol 12.5 BID and se how she tolerates it  Mild respiratory complaints - COVID 19 test ordered in ER is  negative will dc precautions, pt is low pretest probability  Other plan as per orders.  DVT prophylaxis:   Lovenox     Code Status:  Limited code ok to shock, pressorssors vasoactive drugs    as per patient  Does not wish to have any feeding tubes I had personally discussed CODE STATUS    Family Communication:   Family not at  Bedside    Disposition Plan:   To home once workup is complete and patient is stable                         Consults called:   Cardiology is aware will consult  Admission status:  ED Disposition    None       Obs    Level of care    Tele  24H    Precautions: Droplet  Droplet precaution  PPE: Used by the provider:   P100  eye Goggles,  Gloves     Bryley Kovacevic 01/01/2019, 9:28 PM    [facility]     after 2 AM please page floor coverage PA If 7AM-7PM, please contact the day team taking care of the patient using [email or web address]

## 2019-01-01 NOTE — Telephone Encounter (Signed)
Spoke with Rachel Valentine who report having episodes of chest pain off an on. She was recently scheduled to see Dr. Gwenlyn Found in office as a new Rachel Valentine on 4/21. Appointment was cancelled due to COVID-19. Rachel Valentine states symptoms are starting to get worse and report pain from just walking to front door. She states pain will some times radiate to her neck and up her jaw. Rachel Valentine report experiencing pain currently while on the phone. Nurse advised Rachel Valentine to report to ED for further evaluations. Rachel Valentine voiced understanding.   Trish notified.

## 2019-01-02 ENCOUNTER — Observation Stay (HOSPITAL_BASED_OUTPATIENT_CLINIC_OR_DEPARTMENT_OTHER): Payer: Medicare HMO

## 2019-01-02 DIAGNOSIS — R030 Elevated blood-pressure reading, without diagnosis of hypertension: Secondary | ICD-10-CM | POA: Diagnosis not present

## 2019-01-02 DIAGNOSIS — Z1159 Encounter for screening for other viral diseases: Secondary | ICD-10-CM | POA: Diagnosis not present

## 2019-01-02 DIAGNOSIS — I200 Unstable angina: Secondary | ICD-10-CM | POA: Diagnosis not present

## 2019-01-02 DIAGNOSIS — Z87891 Personal history of nicotine dependence: Secondary | ICD-10-CM | POA: Diagnosis not present

## 2019-01-02 DIAGNOSIS — Z7982 Long term (current) use of aspirin: Secondary | ICD-10-CM | POA: Diagnosis not present

## 2019-01-02 DIAGNOSIS — D649 Anemia, unspecified: Secondary | ICD-10-CM | POA: Diagnosis not present

## 2019-01-02 DIAGNOSIS — Z90710 Acquired absence of both cervix and uterus: Secondary | ICD-10-CM | POA: Diagnosis not present

## 2019-01-02 DIAGNOSIS — I208 Other forms of angina pectoris: Secondary | ICD-10-CM

## 2019-01-02 DIAGNOSIS — Z888 Allergy status to other drugs, medicaments and biological substances status: Secondary | ICD-10-CM | POA: Diagnosis not present

## 2019-01-02 DIAGNOSIS — R079 Chest pain, unspecified: Secondary | ICD-10-CM

## 2019-01-02 DIAGNOSIS — I10 Essential (primary) hypertension: Secondary | ICD-10-CM | POA: Diagnosis not present

## 2019-01-02 DIAGNOSIS — Z880 Allergy status to penicillin: Secondary | ICD-10-CM | POA: Diagnosis not present

## 2019-01-02 DIAGNOSIS — E785 Hyperlipidemia, unspecified: Secondary | ICD-10-CM | POA: Diagnosis not present

## 2019-01-02 DIAGNOSIS — I25110 Atherosclerotic heart disease of native coronary artery with unstable angina pectoris: Secondary | ICD-10-CM | POA: Diagnosis not present

## 2019-01-02 LAB — TROPONIN I
Troponin I: <0.03 ng/mL (ref <0.03)
Troponin I: <0.03 ng/mL (ref <0.03)
Troponin I: <0.03 ng/mL (ref <0.03)

## 2019-01-02 LAB — LIPID PANEL
Cholesterol: 178 mg/dL (ref 0–200)
HDL: 79 mg/dL (ref >40)
LDL Cholesterol: 85 mg/dL (ref 0–99)
Total CHOL/HDL Ratio: 2.3 RATIO
Triglycerides: 70 mg/dL (ref <150)
VLDL: 14 mg/dL (ref 0–40)

## 2019-01-02 LAB — HEMOGLOBIN A1C
Hgb A1c MFr Bld: 5.5 % (ref 4.8–5.6)
Mean Plasma Glucose: 111.15 mg/dL

## 2019-01-02 LAB — ECHOCARDIOGRAM COMPLETE
Height: 62 in
Weight: 1918.4 oz

## 2019-01-02 MED ORDER — MORPHINE SULFATE (PF) 2 MG/ML IV SOLN: 2.0000 mg | INTRAVENOUS | Status: DC | PRN

## 2019-01-02 MED ORDER — ONDANSETRON HCL 4 MG/2ML IJ SOLN: 4.0000 mg | Freq: Four times a day (QID) | INTRAMUSCULAR | Status: DC | PRN

## 2019-01-02 MED ORDER — METOPROLOL TARTRATE 12.5 MG HALF TABLET
12.5000 mg | ORAL_TABLET | Freq: Two times a day (BID) | ORAL | Status: DC
  Administered 2019-01-02 – 2019-01-06 (×9): 12.5 mg via ORAL
  Filled 2019-01-02 (×9): qty 1

## 2019-01-02 MED ORDER — ENOXAPARIN SODIUM 40 MG/0.4ML ~~LOC~~ SOLN
40.0000 mg | SUBCUTANEOUS | Status: DC
  Administered 2019-01-02: 03:00:00 40 mg via SUBCUTANEOUS
  Filled 2019-01-02: qty 0.4

## 2019-01-02 MED ORDER — ASPIRIN 81 MG PO CHEW
81.0000 mg | CHEWABLE_TABLET | Freq: Every day | ORAL | Status: DC
  Administered 2019-01-02 – 2019-01-06 (×5): 81 mg via ORAL
  Filled 2019-01-02 (×5): qty 1

## 2019-01-02 MED ORDER — PANTOPRAZOLE SODIUM 40 MG PO TBEC
40.0000 mg | DELAYED_RELEASE_TABLET | Freq: Every day | ORAL | Status: DC
  Administered 2019-01-02 – 2019-01-06 (×4): 40 mg via ORAL
  Filled 2019-01-02 (×5): qty 1

## 2019-01-02 MED ORDER — ROSUVASTATIN CALCIUM 5 MG PO TABS
5.0000 mg | ORAL_TABLET | ORAL | Status: DC
  Administered 2019-01-02: 16:00:00 5 mg via ORAL
  Filled 2019-01-02: qty 1

## 2019-01-02 MED ORDER — CALCIUM CITRATE 950 (200 CA) MG PO TABS
200.0000 mg | ORAL_TABLET | Freq: Every day | ORAL | Status: DC
  Filled 2019-01-02 (×5): qty 1

## 2019-01-02 MED ORDER — ENOXAPARIN SODIUM 60 MG/0.6ML ~~LOC~~ SOLN
1.0000 mg/kg | Freq: Two times a day (BID) | SUBCUTANEOUS | Status: AC
  Administered 2019-01-02 – 2019-01-03 (×4): 55 mg via SUBCUTANEOUS
  Filled 2019-01-02 (×4): qty 0.6

## 2019-01-02 MED ORDER — ACETAMINOPHEN 325 MG PO TABS: 650.0000 mg | ORAL_TABLET | ORAL | Status: DC | PRN

## 2019-01-02 MED ORDER — VITAMIN D 25 MCG (1000 UNIT) PO TABS
1000.0000 [IU] | ORAL_TABLET | Freq: Every day | ORAL | Status: DC
  Administered 2019-01-02: 16:00:00 1000 [IU] via ORAL
  Filled 2019-01-02 (×2): qty 1

## 2019-01-02 NOTE — Progress Notes (Signed)
PROGRESS NOTE    BERNADEAN Valentine  CNO:[id number] DOB: 12-06-1940 DOA: 01/01/2019 PCP: Angelina Pih, MD    Brief Narrative:    78 year old female with history of hyperlipidemia, no history of coronary artery disease presented to the emergency room with 3 months of intermittent chest pain, now worsening and persistent and more frequent symptoms.  She had tried Prilosec with no improvement.  EKG and troponin are nonischemic.  Assessment & Plan:   Active Problems:   Chest pain   Elevated BP without diagnosis of hypertension  Unstable angina: Suspected.  Currently without chest pain.  Patient is on aspirin.  On therapeutic Lovenox.  Resume Crestor.  Continue to monitor in telemetry unit.  Started on beta-blockers. Nitrates and morphine for recurrent pain. Seen by cardiology.  Plan for cardiac cath on 01/04/2019 if no recurrence of chest pain.  If she has any recurrent chest pain, she will have urgent cath.  Hyperlipidemia: On Crestor.  She will continue.   DVT prophylaxis: Lovenox Code Status: Full code Family Communication: None Disposition Plan: Telemetry   Consultants:   Cardiology  Procedures:   None, echocardiogram pending  Antimicrobials:   None   Subjective: Patient was seen and examined.  Denies any chest pain since admission.  Patient complained of 3 months of intermittent chest pain, trial of PPI with no improvement.  Currently deteriorating and persistent more frequent symptoms. Discussed with patient about staying in the hospital monitor and plan for cardiac cath.  Objective: Vitals:   01/02/19 0045 01/02/19 0147 01/02/19 0619 01/02/19 0943  BP: 134/76 (!) 148/70 114/67 (!) 145/70  Pulse: 84 88 82   Resp: 15     Temp:  97.9 F (36.6 C) 98.1 F (36.7 C)   TempSrc:  Oral Oral   SpO2: 97% 100% 98%   Weight:   54.4 kg   Height:        Intake/Output Summary (Last 24 hours) at 01/02/2019 1339 Last data filed at 01/02/2019 0940 Gross per 24 hour   Intake 480 ml  Output -  Net 480 ml   Filed Weights   01/01/19 1348 01/02/19 0619  Weight: 54.4 kg 54.4 kg    Examination:  General exam: Appears calm and comfortable  Respiratory system: Clear to auscultation. Respiratory effort normal. Cardiovascular system: S1 & S2 heard, RRR. No JVD, murmurs, rubs, gallops or clicks. No pedal edema. Gastrointestinal system: Abdomen is nondistended, soft and nontender. No organomegaly or masses felt. Normal bowel sounds heard. Central nervous system: Alert and oriented. No focal neurological deficits. Extremities: Symmetric 5 x 5 power. Skin: No rashes, lesions or ulcers Psychiatry: Judgement and insight appear normal. Mood & affect appropriate.     Data Reviewed: I have personally reviewed following labs and imaging studies  CBC: Recent Labs  Lab 01/01/19 1353  WBC 10.1  HGB 14.4  HCT 43.9  MCV 92.2  PLT 294   Basic Metabolic Panel: Recent Labs  Lab 01/01/19 1353 01/01/19 2052  NA 142  --   K 3.5  --   CL 101  --   CO2 28  --   GLUCOSE 121*  --   BUN 19  --   CREATININE 0.78  --   CALCIUM 10.6*  --   MG  --  2.3  PHOS  --  3.5   GFR: Estimated Creatinine Clearance: 47.3 mL/min (by C-G formula based on SCr of 0.78 mg/dL). Liver Function Tests: No results for input(s): AST, ALT, ALKPHOS, BILITOT, PROT, ALBUMIN in  the last 168 hours. No results for input(s): LIPASE, AMYLASE in the last 168 hours. No results for input(s): AMMONIA in the last 168 hours. Coagulation Profile: No results for input(s): INR, PROTIME in the last 168 hours. Cardiac Enzymes: Recent Labs  Lab 01/01/19 1353 01/01/19 1721 01/02/19 0216 01/02/19 0438 01/02/19 0748  TROPONINI <0.03 <0.03 <0.03 <0.03 <0.03   BNP (last 3 results) No results for input(s): PROBNP in the last 8760 hours. HbA1C: Recent Labs    01/02/19 0216  HGBA1C 5.5   CBG: No results for input(s): GLUCAP in the last 168 hours. Lipid Profile: Recent Labs    01/02/19  0216  CHOL 178  HDL 79  LDLCALC 85  TRIG 70  CHOLHDL 2.3   Thyroid Function Tests: Recent Labs    01/01/19 2111  TSH 3.976   Anemia Panel: No results for input(s): VITAMINB12, FOLATE, FERRITIN, TIBC, IRON, RETICCTPCT in the last 72 hours. Sepsis Labs: No results for input(s): PROCALCITON, LATICACIDVEN in the last 168 hours.  Recent Results (from the past 240 hour(s))  SARS Coronavirus 2 (CEPHEID - Performed in [place] hospital lab), Hosp Order     Status: None   Collection Time: 01/01/19  9:54 PM  Result Value Ref Range Status   SARS Coronavirus 2 NEGATIVE NEGATIVE Final    Comment: (NOTE) If result is NEGATIVE SARS-CoV-2 target nucleic acids are NOT DETECTED. The SARS-CoV-2 RNA is generally detectable in upper and lower  respiratory specimens during the acute phase of infection. The lowest  concentration of SARS-CoV-2 viral copies this assay can detect is 250  copies / mL. A negative result does not preclude SARS-CoV-2 infection  and should not be used as the sole basis for treatment or other  patient management decisions.  A negative result may occur with  improper specimen collection / handling, submission of specimen other  than nasopharyngeal swab, presence of viral mutation(s) within the  areas targeted by this assay, and inadequate number of viral copies  (<250 copies / mL). A negative result must be combined with clinical  observations, patient history, and epidemiological information. If result is POSITIVE SARS-CoV-2 target nucleic acids are DETECTED. The SARS-CoV-2 RNA is generally detectable in upper and lower  respiratory specimens dur ing the acute phase of infection.  Positive  results are indicative of active infection with SARS-CoV-2.  Clinical  correlation with patient history and other diagnostic information is  necessary to determine patient infection status.  Positive results do  not rule out bacterial infection or co-infection with other viruses.  If result is PRESUMPTIVE POSTIVE SARS-CoV-2 nucleic acids MAY BE PRESENT.   A presumptive positive result was obtained on the submitted specimen  and confirmed on repeat testing.  While 2019 novel coronavirus  (SARS-CoV-2) nucleic acids may be present in the submitted sample  additional confirmatory testing may be necessary for epidemiological  and / or clinical management purposes  to differentiate between  SARS-CoV-2 and other Sarbecovirus currently known to infect humans.  If clinically indicated additional testing with an alternate test  methodology [phone]) is advised. The SARS-CoV-2 RNA is generally  detectable in upper and lower respiratory sp ecimens during the acute  phase of infection. The expected result is Negative. Fact Sheet for Patients:  StrictlyIdeas.no Fact Sheet for Healthcare Providers: BankingDealers.co.za This test is not yet approved or cleared by the [place] FDA and has been authorized for detection and/or diagnosis of SARS-CoV-2 by FDA under an Emergency Use Authorization (EUA).  This EUA will  remain in effect (meaning this test can be used) for the duration of the COVID-19 declaration under Section 564(b)(1) of the Act, 21 U.S.C. section 360bbb-3(b)(1), unless the authorization is terminated or revoked sooner. Performed at [facility], [place] [address] [place], [place] [address]          Radiology Studies: Dg Chest 2 View  Result Date: 01/01/2019 CLINICAL DATA:  Chest pain EXAM: CHEST - 2 VIEW COMPARISON:  None. FINDINGS: The lungs are hyperinflated with diffuse interstitial prominence. No focal airspace consolidation or pulmonary edema. No pleural effusion or pneumothorax. Normal cardiomediastinal contours. IMPRESSION: Hyperinflation may indicate COPD.  No acute airspace disease. Electronically Signed   By: Ulyses Jarred M.D.   On: 01/01/2019 14:18        Scheduled Meds: . aspirin  81 mg  Oral Daily  . enoxaparin (LOVENOX) injection  1 mg/kg Subcutaneous Q12H  . metoprolol tartrate  12.5 mg Oral BID   Continuous Infusions:   LOS: 0 days    Time spent: 25 minutes    Barb Merino, MD [facility] Pager [phone]  If 7PM-7AM, please contact night-coverage [email or web address] Password [email or web address] 01/02/2019, 1:39 PM

## 2019-01-02 NOTE — ED Notes (Signed)
ED TO INPATIENT HANDOFF REPORT  ED Nurse Name and Phone #: Caprice Kluver [phone]  S Name/Age/Gender Rachel Valentine 78 y.o. female Room/Bed: 024C/024C  Code Status   Code Status: Not on file  Home/SNF/Other Home Patient oriented to: self, place, time and situation Is this baseline? Yes   Triage Complete: Triage complete  Chief Complaint cp  Triage Note Pt here for CP x 2 months. Pt states her chest pain is centralized and constant. Pt states it radiates up her chest. Pt states it feels like she has spasms after she eats.    Allergies Allergies  Allergen Reactions  . Prednisone Other (See Comments)    Blood pressure high when she takes it  . Penicillins Rash    Level of Care/Admitting Diagnosis ED Disposition    ED Disposition Condition Comment   Admit  Hospital Area: [facility] [[id number]]  Level of Care: Telemetry Cardiac [103]  I expect the patient will be discharged within 24 hours: No (not a candidate for 5C-Observation unit)  Covid Evaluation: N/A  Diagnosis: Chest pain [287681]  Admitting Physician: Toy Baker [[id number]]  Attending Physician: Toy Baker [[id number]]  PT Class (Do Not Modify): Observation [104]  PT Acc Code (Do Not Modify): Observation [10022]       B Medical/Surgery History Past Medical History:  Diagnosis Date  . Bowel obstruction [facility])    Past Surgical History:  Procedure Laterality Date  . ABDOMINAL HYSTERECTOMY    . ABDOMINAL SURGERY    . APPENDECTOMY    . AUGMENTATION MAMMAPLASTY Bilateral    implants removed in 2005  . BREAST EXCISIONAL BIOPSY Left    benign     A IV Location/Drains/Wounds Patient Lines/Drains/Airways Status   Active Line/Drains/Airways    None          Intake/Output Last 24 hours No intake or output data in the 24 hours ending 01/02/19 0019  Labs/Imaging Results for orders placed or performed during the hospital encounter of 01/01/19 (from the past 48 hour(s))  Basic  metabolic panel     Status: Abnormal   Collection Time: 01/01/19  1:53 PM  Result Value Ref Range   Sodium 142 135 - 145 mmol/L   Potassium 3.5 3.5 - 5.1 mmol/L   Chloride 101 98 - 111 mmol/L   CO2 28 22 - 32 mmol/L   Glucose, Bld 121 (H) 70 - 99 mg/dL   BUN 19 8 - 23 mg/dL   Creatinine, Ser 0.78 0.44 - 1.00 mg/dL   Calcium 10.6 (H) 8.9 - 10.3 mg/dL   GFR calc non Af Amer >60 >60 mL/min   GFR calc Af Amer >60 >60 mL/min   Anion gap 13 5 - 15    Comment: Performed at [facility], [place] [address] [place] [address]  CBC     Status: None   Collection Time: 01/01/19  1:53 PM  Result Value Ref Range   WBC 10.1 4.0 - 10.5 K/uL   RBC 4.76 3.87 - 5.11 MIL/uL   Hemoglobin 14.4 12.0 - 15.0 g/dL   HCT 43.9 36.0 - 46.0 %   MCV 92.2 80.0 - 100.0 fL   MCH 30.3 26.0 - 34.0 pg   MCHC 32.8 30.0 - 36.0 g/dL   RDW 13.1 11.5 - 15.5 %   Platelets 321 150 - 400 K/uL   nRBC 0.0 0.0 - 0.2 %    Comment: Performed at [facility], [place] [address], [place], [place] [address]  Troponin I - ONCE - STAT     Status: None   Collection Time: 01/01/19  1:53 PM  Result Value Ref Range   Troponin I <0.03 <0.03 ng/mL    Comment: Performed at [facility], [place] [address], [place], [place] [address]  Troponin I - ONCE - STAT     Status: None   Collection Time: 01/01/19  5:21 PM  Result Value Ref Range   Troponin I <0.03 <0.03 ng/mL    Comment: Performed at [facility], [place] [address] [place], [place] [address]  Magnesium     Status: None   Collection Time: 01/01/19  8:52 PM  Result Value Ref Range   Magnesium 2.3 1.7 - 2.4 mg/dL    Comment: Performed at [facility], [place] [address] [place], [place] [address]  Phosphorus     Status: None   Collection Time: 01/01/19  8:52 PM  Result Value Ref Range   Phosphorus 3.5 2.5 - 4.6 mg/dL    Comment: Performed at [facility], [place] [address], [place], [place] [address]  Brain natriuretic peptide     Status: None    Collection Time: 01/01/19  8:58 PM  Result Value Ref Range   B Natriuretic Peptide 28.5 0.0 - 100.0 pg/mL    Comment: Performed at [place] [address] [place], [place] [address]  TSH     Status: None   Collection Time: 01/01/19  9:11 PM  Result Value Ref Range   TSH 3.976 0.350 - 4.500 uIU/mL    Comment: Performed by a 3rd Generation assay with a functional sensitivity of <=0.01 uIU/mL. Performed at [facility], [place] [address], [place], [place] [address]   SARS Coronavirus 2 (CEPHEID - Performed in [place] hospital lab), Hosp Order     Status: None   Collection Time: 01/01/19  9:54 PM  Result Value Ref Range   SARS Coronavirus 2 NEGATIVE NEGATIVE    Comment: (NOTE) If result is NEGATIVE SARS-CoV-2 target nucleic acids are NOT DETECTED. The SARS-CoV-2 RNA is generally detectable in upper and lower  respiratory specimens during the acute phase of infection. The lowest  concentration of SARS-CoV-2 viral copies this assay can detect is 250  copies / mL. A negative result does not preclude SARS-CoV-2 infection  and should not be used as the sole basis for treatment or other  patient management decisions.  A negative result may occur with  improper specimen collection / handling, submission of specimen other  than nasopharyngeal swab, presence of viral mutation(s) within the  areas targeted by this assay, and inadequate number of viral copies  (<250 copies / mL). A negative result must be combined with clinical  observations, patient history, and epidemiological information. If result is POSITIVE SARS-CoV-2 target nucleic acids are DETECTED. The SARS-CoV-2 RNA is generally detectable in upper and lower  respiratory specimens dur ing the acute phase of infection.  Positive  results are indicative of active infection with SARS-CoV-2.  Clinical  correlation with patient history and other diagnostic information is  necessary to determine patient infection status.   Positive results do  not rule out bacterial infection or co-infection with other viruses. If result is PRESUMPTIVE POSTIVE SARS-CoV-2 nucleic acids MAY BE PRESENT.   A presumptive positive result was obtained on the submitted specimen  and confirmed on repeat testing.  While 2019 novel coronavirus  (SARS-CoV-2) nucleic acids may be present in the submitted  sample  additional confirmatory testing may be necessary for epidemiological  and / or clinical management purposes  to differentiate between  SARS-CoV-2 and other Sarbecovirus currently known to infect humans.  If clinically indicated additional testing with an alternate test  methodology [phone]) is advised. The SARS-CoV-2 RNA is generally  detectable in upper and lower respiratory sp ecimens during the acute  phase of infection. The expected result is Negative. Fact Sheet for Patients:  StrictlyIdeas.no Fact Sheet for Healthcare Providers: BankingDealers.co.za This test is not yet approved or cleared by the [place] FDA and has been authorized for detection and/or diagnosis of SARS-CoV-2 by FDA under an Emergency Use Authorization (EUA).  This EUA will remain in effect (meaning this test can be used) for the duration of the COVID-19 declaration under Section 564(b)(1) of the Act, 21 U.S.C. section 360bbb-3(b)(1), unless the authorization is terminated or revoked sooner. Performed at [facility], [place] [address] [place], [place] [address]    Dg Chest 2 View  Result Date: 01/01/2019 CLINICAL DATA:  Chest pain EXAM: CHEST - 2 VIEW COMPARISON:  None. FINDINGS: The lungs are hyperinflated with diffuse interstitial prominence. No focal airspace consolidation or pulmonary edema. No pleural effusion or pneumothorax. Normal cardiomediastinal contours. IMPRESSION: Hyperinflation may indicate COPD.  No acute airspace disease. Electronically Signed   By: Ulyses Jarred M.D.   On:  01/01/2019 14:18    Pending Labs [employer] (From admission, onward)    Start     Ordered   Signed and Held  Troponin I - Now Then Q3H  Now then every 3 hours,   TIMED     Signed and Held   Signed and Held  Hemoglobin A1c  Tomorrow morning,   R     Signed and Held   Signed and Held  Lipid panel  Tomorrow morning,   R     Signed and Held          Vitals/Pain Today's Vitals   01/01/19 2230 01/01/19 2315 01/01/19 2330 01/01/19 2345  BP: (!) 157/79 135/60 128/65 136/70  Pulse: 88 90 84 84  Resp: 19 17 16 18   Temp:      TempSrc:      SpO2: 98% 96% 96% 95%  Weight:      Height:      PainSc:        Isolation Precautions Droplet precaution  Medications Medications  potassium chloride SA (K-DUR) CR tablet 40 mEq (40 mEq Oral Given 01/01/19 2152)    Mobility walks Low fall risk   Focused Assessments Cardiac Assessment Handoff:    Lab Results  Component Value Date   TROPONINI <0.03 01/01/2019   No results found for: DDIMER Does the Patient currently have chest pain? No     R Recommendations: See Admitting Provider Note  Report given to:   Additional Notes:

## 2019-01-02 NOTE — Progress Notes (Signed)
   Progress Note   Subjective   Doing well today, the patient denies CP or SOB.  No new concerns  Inpatient Medications    Scheduled Meds: . enoxaparin (LOVENOX) injection  40 mg Subcutaneous Q24H  . metoprolol tartrate  12.5 mg Oral BID   Continuous Infusions:  PRN Meds: acetaminophen, morphine injection, ondansetron (ZOFRAN) IV   Vital Signs    Vitals:   01/01/19 2345 01/02/19 0045 01/02/19 0147 01/02/19 0619  BP: 136/70 134/76 (!) 148/70 114/67  Pulse: 84 84 88 82  Resp: 18 15    Temp:   97.9 F (36.6 C) 98.1 F (36.7 C)  TempSrc:   Oral Oral  SpO2: 95% 97% 100% 98%  Weight:    54.4 kg  Height:        Intake/Output Summary (Last 24 hours) at 01/02/2019 0832 Last data filed at 01/02/2019 0201 Gross per 24 hour  Intake 240 ml  Output -  Net 240 ml   Filed Weights   01/01/19 1348 01/02/19 0619  Weight: 54.4 kg 54.4 kg    Telemetry    sinus - Personally Reviewed  Physical Exam   GEN- The patient is well appearing, alert and oriented x 3 today.   Head- normocephalic, atraumatic Eyes-  Sclera clear, conjunctiva pink Ears- hearing intact Oropharynx- clear Neck- supple, Lungs- Clear to ausculation bilaterally, normal work of breathing Heart- Regular rate and rhythm  GI- soft, NT, ND, + BS Extremities- no clubbing, cyanosis, or edema  MS- no significant deformity or atrophy Skin- no rash or lesion Psych- euthymic mood, full affect Neuro- strength and sensation are intact   Labs    Chemistry Recent Labs  Lab 01/01/19 1353  NA 142  K 3.5  CL 101  CO2 28  GLUCOSE 121*  BUN 19  CREATININE 0.78  CALCIUM 10.6*  GFRNONAA >60  GFRAA >60  ANIONGAP 13     Hematology Recent Labs  Lab 01/01/19 1353  WBC 10.1  RBC 4.76  HGB 14.4  HCT 43.9  MCV 92.2  MCH 30.3  MCHC 32.8  RDW 13.1  PLT 321    Cardiac Enzymes Recent Labs  Lab 01/01/19 1353 01/01/19 1721 01/02/19 0216 01/02/19 0438  TROPONINI <0.03 <0.03 <0.03 <0.03   No results for  input(s): TROPIPOC in the last 168 hours.      Assessment & Plan    1.  Chest pain She has exertional crushing pain which radiates into her neck and jaw and is relieved by rest.  Progressive over the past few weeks. Her story is very concerning for unstable angina.  I would advise cath rather than cardiac CT. Place on ASA Continue metoprolol Change lovenox to therapeutic dose If she has worsening chest pain, we may cath over the weekend, otherwise will plan for Monday. Echo is pending  Thompson Grayer MD, [facility] 01/02/2019 8:32 AM

## 2019-01-02 NOTE — Progress Notes (Signed)
  Echocardiogram 2D Echocardiogram has been performed.  Rachel Valentine G Lindora Alviar 01/02/2019, 10:41 AM

## 2019-01-02 NOTE — Progress Notes (Signed)
Pt has the second thought about heart cath on Monday, MD made aware. MD stated would discuss with pt tomorrow morning.

## 2019-01-02 NOTE — Care Management Obs Status (Signed)
[place] NOTIFICATION   Patient Details  Name: Rachel Valentine MRN: [id number] Date of Birth: 07-06-1941   Medicare Observation Status Notification Given:  Yes    Claudie Leach, RN 01/02/2019, 5:52 PM

## 2019-01-03 ENCOUNTER — Encounter (HOSPITAL_COMMUNITY): Payer: Self-pay | Admitting: *Deleted

## 2019-01-03 DIAGNOSIS — Z7982 Long term (current) use of aspirin: Secondary | ICD-10-CM | POA: Diagnosis not present

## 2019-01-03 DIAGNOSIS — I10 Essential (primary) hypertension: Secondary | ICD-10-CM | POA: Diagnosis not present

## 2019-01-03 DIAGNOSIS — R030 Elevated blood-pressure reading, without diagnosis of hypertension: Secondary | ICD-10-CM | POA: Diagnosis not present

## 2019-01-03 DIAGNOSIS — Z791 Long term (current) use of non-steroidal anti-inflammatories (NSAID): Secondary | ICD-10-CM | POA: Diagnosis not present

## 2019-01-03 DIAGNOSIS — D649 Anemia, unspecified: Secondary | ICD-10-CM | POA: Diagnosis not present

## 2019-01-03 DIAGNOSIS — I25110 Atherosclerotic heart disease of native coronary artery with unstable angina pectoris: Secondary | ICD-10-CM | POA: Diagnosis not present

## 2019-01-03 DIAGNOSIS — Z90710 Acquired absence of both cervix and uterus: Secondary | ICD-10-CM | POA: Diagnosis not present

## 2019-01-03 DIAGNOSIS — Z955 Presence of coronary angioplasty implant and graft: Secondary | ICD-10-CM | POA: Diagnosis not present

## 2019-01-03 DIAGNOSIS — I208 Other forms of angina pectoris: Secondary | ICD-10-CM | POA: Diagnosis not present

## 2019-01-03 DIAGNOSIS — Z880 Allergy status to penicillin: Secondary | ICD-10-CM | POA: Diagnosis not present

## 2019-01-03 DIAGNOSIS — Z79899 Other long term (current) drug therapy: Secondary | ICD-10-CM | POA: Diagnosis not present

## 2019-01-03 DIAGNOSIS — Z1159 Encounter for screening for other viral diseases: Secondary | ICD-10-CM | POA: Diagnosis not present

## 2019-01-03 DIAGNOSIS — E785 Hyperlipidemia, unspecified: Secondary | ICD-10-CM | POA: Diagnosis not present

## 2019-01-03 DIAGNOSIS — Z823 Family history of stroke: Secondary | ICD-10-CM | POA: Diagnosis not present

## 2019-01-03 DIAGNOSIS — R079 Chest pain, unspecified: Secondary | ICD-10-CM | POA: Diagnosis not present

## 2019-01-03 DIAGNOSIS — Z888 Allergy status to other drugs, medicaments and biological substances status: Secondary | ICD-10-CM | POA: Diagnosis not present

## 2019-01-03 DIAGNOSIS — Z8249 Family history of ischemic heart disease and other diseases of the circulatory system: Secondary | ICD-10-CM | POA: Diagnosis not present

## 2019-01-03 DIAGNOSIS — I200 Unstable angina: Secondary | ICD-10-CM | POA: Diagnosis not present

## 2019-01-03 DIAGNOSIS — Z87891 Personal history of nicotine dependence: Secondary | ICD-10-CM | POA: Diagnosis not present

## 2019-01-03 MED ORDER — SODIUM CHLORIDE 0.9 % IV SOLN: 250.0000 mL | INTRAVENOUS | Status: DC | PRN

## 2019-01-03 MED ORDER — SODIUM CHLORIDE 0.9% FLUSH
3.0000 mL | Freq: Two times a day (BID) | INTRAVENOUS | Status: DC
  Administered 2019-01-03: 21:00:00 3 mL via INTRAVENOUS

## 2019-01-03 MED ORDER — SODIUM CHLORIDE 0.9 % WEIGHT BASED INFUSION
1.0000 mL/kg/h | INTRAVENOUS | Status: DC
  Administered 2019-01-03: 1 mL/kg/h via INTRAVENOUS

## 2019-01-03 MED ORDER — SODIUM CHLORIDE 0.9% FLUSH: 3.0000 mL | INTRAVENOUS | Status: DC | PRN

## 2019-01-03 NOTE — H&P (View-Only) (Signed)
   Progress Note   Subjective   Doing well today, the patient denies SOB.  She had chest pain this am with radiation into her neck while making her bed. This pain lasted about 10 minutes and is now resolved.  Inpatient Medications    Scheduled Meds: . aspirin  81 mg Oral Daily  . calcium citrate  200 mg of elemental calcium Oral Daily  . cholecalciferol  1,000 Units Oral Daily  . enoxaparin (LOVENOX) injection  1 mg/kg Subcutaneous Q12H  . metoprolol tartrate  12.5 mg Oral BID  . pantoprazole  40 mg Oral Daily  . rosuvastatin  5 mg Oral Q72H   Continuous Infusions:  PRN Meds: acetaminophen, morphine injection, ondansetron (ZOFRAN) IV   Vital Signs    Vitals:   01/02/19 2145 01/03/19 0026 01/03/19 0352 01/03/19 0649  BP: 117/64 (!) 120/56 (!) 111/58   Pulse: 80 75 69   Resp: 15 20 17    Temp: 97.9 F (36.6 C) 98 F (36.7 C) 97.7 F (36.5 C)   TempSrc: Oral Oral Oral   SpO2: 97% 97% 97%   Weight:    53.6 kg  Height:        Intake/Output Summary (Last 24 hours) at 01/03/2019 0851 Last data filed at 01/03/2019 0700 Gross per 24 hour  Intake 1062 ml  Output 650 ml  Net 412 ml   Filed Weights   01/01/19 1348 01/02/19 0619 01/03/19 0649  Weight: 54.4 kg 54.4 kg 53.6 kg    Telemetry    sinus - Personally Reviewed  Physical Exam   GEN- The patient is well appearing, alert and oriented x 3 today.   Head- normocephalic, atraumatic Eyes-  Sclera clear, conjunctiva pink Ears- hearing intact Oropharynx- clear Neck- supple, Lungs- normal work of breathing Heart- Regular rate and rhythm  GI- soft, NT, ND, + BS Extremities- no clubbing, cyanosis, or edema  MS- no significant deformity or atrophy Skin- no rash or lesion Psych- euthymic mood, full affect Neuro- strength and sensation are intact   Labs    Chemistry Recent Labs  Lab 01/01/19 1353  NA 142  K 3.5  CL 101  CO2 28  GLUCOSE 121*  BUN 19  CREATININE 0.78  CALCIUM 10.6*  GFRNONAA >60  GFRAA  >60  ANIONGAP 13     Hematology Recent Labs  Lab 01/01/19 1353  WBC 10.1  RBC 4.76  HGB 14.4  HCT 43.9  MCV 92.2  MCH 30.3  MCHC 32.8  RDW 13.1  PLT 321    Cardiac Enzymes Recent Labs  Lab 01/01/19 1721 01/02/19 0216 01/02/19 0438 01/02/19 0748  TROPONINI <0.03 <0.03 <0.03 <0.03   No results for input(s): TROPIPOC in the last 168 hours.     Echo from yesterday is reviewed  Assessment & Plan    1.  Chest pain Worrisome for unstable angina. On lovenox (hold after midnight tonight) Continue ASA, statin, metoprolol. I would therefore recommend left heart catheterization with possible PCI.  Discussed the cath with the patient. The patient understands that risks included but are not limited to stroke (1 in 1000), death (1 in 54), kidney failure [usually temporary] (1 in 500), bleeding (1 in 200), allergic reaction [possibly serious] (1 in 200). The patient understands and agrees to proceed.  NPO after midnight tonight.  She has been placed on the cath board.   Thompson Grayer MD, [facility] 01/03/2019 8:51 AM

## 2019-01-03 NOTE — Progress Notes (Signed)
PROGRESS NOTE    Rachel Valentine  UXN:[id number] DOB: December 08, 1940 DOA: 01/01/2019 PCP: Angelina Pih, MD    Brief Narrative:   78 year old female with history of hyperlipidemia, no history of coronary artery disease presented to the emergency room with 3 months of intermittent chest pain, now worsening and persistent and more frequent symptoms.  She had tried Prilosec with no improvement.  EKG and troponin are nonischemic.  Assessment & Plan:   Active Problems:   Chest pain   Elevated BP without diagnosis of hypertension  Unstable angina: Patient is on aspirin.  On therapeutic Lovenox.  Resumed Crestor.  Continue to monitor in telemetry unit.  Started on beta-blockers. Nitrates and morphine for recurrent pain. Seen by cardiology.  Plan for cardiac cath on 01/04/2019.   Hyperlipidemia: On Crestor.  She will continue.   DVT prophylaxis: Lovenox Code Status: Full code Family Communication: None Disposition Plan: Telemetry   Consultants:   Cardiology  Procedures:   None, echocardiogram pending  Antimicrobials:   None   Subjective: Patient was seen and examined.  She was apprehensive about cardiac cath, after explaining patient was very comfortable. She had another episode of transient midsternal chest pain at about 8 AM that was self-limiting in about 1 minute. Objective: Vitals:   01/02/19 2145 01/03/19 0026 01/03/19 0352 01/03/19 0649  BP: 117/64 (!) 120/56 (!) 111/58   Pulse: 80 75 69   Resp: 15 20 17    Temp: 97.9 F (36.6 C) 98 F (36.7 C) 97.7 F (36.5 C)   TempSrc: Oral Oral Oral   SpO2: 97% 97% 97%   Weight:    53.6 kg  Height:        Intake/Output Summary (Last 24 hours) at 01/03/2019 0838 Last data filed at 01/03/2019 0700 Gross per 24 hour  Intake 1062 ml  Output 650 ml  Net 412 ml   Filed Weights   01/01/19 1348 01/02/19 0619 01/03/19 0649  Weight: 54.4 kg 54.4 kg 53.6 kg    Examination:  General exam: Appears calm and comfortable   Respiratory system: Clear to auscultation. Respiratory effort normal. Cardiovascular system: S1 & S2 heard, RRR. No JVD, murmurs, rubs, gallops or clicks. No pedal edema. Gastrointestinal system: Abdomen is nondistended, soft and nontender. No organomegaly or masses felt. Normal bowel sounds heard. Central nervous system: Alert and oriented. No focal neurological deficits. Extremities: Symmetric 5 x 5 power. Skin: No rashes, lesions or ulcers Psychiatry: Judgement and insight appear normal. Mood & affect appropriate.     Data Reviewed: I have personally reviewed following labs and imaging studies  CBC: Recent Labs  Lab 01/01/19 1353  WBC 10.1  HGB 14.4  HCT 43.9  MCV 92.2  PLT 254   Basic Metabolic Panel: Recent Labs  Lab 01/01/19 1353 01/01/19 2052  NA 142  --   K 3.5  --   CL 101  --   CO2 28  --   GLUCOSE 121*  --   BUN 19  --   CREATININE 0.78  --   CALCIUM 10.6*  --   MG  --  2.3  PHOS  --  3.5   GFR: Estimated Creatinine Clearance: 47.3 mL/min (by C-G formula based on SCr of 0.78 mg/dL). Liver Function Tests: No results for input(s): AST, ALT, ALKPHOS, BILITOT, PROT, ALBUMIN in the last 168 hours. No results for input(s): LIPASE, AMYLASE in the last 168 hours. No results for input(s): AMMONIA in the last 168 hours. Coagulation Profile: No results for input(s):  INR, PROTIME in the last 168 hours. Cardiac Enzymes: Recent Labs  Lab 01/01/19 1353 01/01/19 1721 01/02/19 0216 01/02/19 0438 01/02/19 0748  TROPONINI <0.03 <0.03 <0.03 <0.03 <0.03   BNP (last 3 results) No results for input(s): PROBNP in the last 8760 hours. HbA1C: Recent Labs    01/02/19 0216  HGBA1C 5.5   CBG: No results for input(s): GLUCAP in the last 168 hours. Lipid Profile: Recent Labs    01/02/19 0216  CHOL 178  HDL 79  LDLCALC 85  TRIG 70  CHOLHDL 2.3   Thyroid Function Tests: Recent Labs    01/01/19 2111  TSH 3.976   Anemia Panel: No results for input(s):  VITAMINB12, FOLATE, FERRITIN, TIBC, IRON, RETICCTPCT in the last 72 hours. Sepsis Labs: No results for input(s): PROCALCITON, LATICACIDVEN in the last 168 hours.  Recent Results (from the past 240 hour(s))  SARS Coronavirus 2 (CEPHEID - Performed in [place] [facility] lab), Hosp Order     Status: None   Collection Time: 01/01/19  9:54 PM  Result Value Ref Range Status   SARS Coronavirus 2 NEGATIVE NEGATIVE Final    Comment: (NOTE) If result is NEGATIVE SARS-CoV-2 target nucleic acids are NOT DETECTED. The SARS-CoV-2 RNA is generally detectable in upper and lower  respiratory specimens during the acute phase of infection. The lowest  concentration of SARS-CoV-2 viral copies this assay can detect is 250  copies / mL. A negative result does not preclude SARS-CoV-2 infection  and should not be used as the sole basis for treatment or other  patient management decisions.  A negative result may occur with  improper specimen collection / handling, submission of specimen other  than nasopharyngeal swab, presence of viral mutation(s) within the  areas targeted by this assay, and inadequate number of viral copies  (<250 copies / mL). A negative result must be combined with clinical  observations, patient history, and epidemiological information. If result is POSITIVE SARS-CoV-2 target nucleic acids are DETECTED. The SARS-CoV-2 RNA is generally detectable in upper and lower  respiratory specimens dur ing the acute phase of infection.  Positive  results are indicative of active infection with SARS-CoV-2.  Clinical  correlation with patient history and other diagnostic information is  necessary to determine patient infection status.  Positive results do  not rule out bacterial infection or co-infection with other viruses. If result is PRESUMPTIVE POSTIVE SARS-CoV-2 nucleic acids MAY BE PRESENT.   A presumptive positive result was obtained on the submitted specimen  and confirmed on repeat  testing.  While 2019 novel coronavirus  (SARS-CoV-2) nucleic acids may be present in the submitted sample  additional confirmatory testing may be necessary for epidemiological  and / or clinical management purposes  to differentiate between  SARS-CoV-2 and other Sarbecovirus currently known to infect humans.  If clinically indicated additional testing with an alternate test  methodology [phone]) is advised. The SARS-CoV-2 RNA is generally  detectable in upper and lower respiratory sp ecimens during the acute  phase of infection. The expected result is Negative. Fact Sheet for Patients:  StrictlyIdeas.no Fact Sheet for Healthcare Providers: BankingDealers.co.za This test is not yet approved or cleared by the [place] FDA and has been authorized for detection and/or diagnosis of SARS-CoV-2 by FDA under an Emergency Use Authorization (EUA).  This EUA will remain in effect (meaning this test can be used) for the duration of the COVID-19 declaration under Section 564(b)(1) of the Act, 21 U.S.C. section 360bbb-3(b)(1), unless the authorization is terminated  or revoked sooner. Performed at [facility], [place] [address], [place], [place] [address]          Radiology Studies: Dg Chest 2 View  Result Date: 01/01/2019 CLINICAL DATA:  Chest pain EXAM: CHEST - 2 VIEW COMPARISON:  None. FINDINGS: The lungs are hyperinflated with diffuse interstitial prominence. No focal airspace consolidation or pulmonary edema. No pleural effusion or pneumothorax. Normal cardiomediastinal contours. IMPRESSION: Hyperinflation may indicate COPD.  No acute airspace disease. Electronically Signed   By: Ulyses Jarred M.D.   On: 01/01/2019 14:18        Scheduled Meds: . aspirin  81 mg Oral Daily  . calcium citrate  200 mg of elemental calcium Oral Daily  . cholecalciferol  1,000 Units Oral Daily  . enoxaparin (LOVENOX) injection  1 mg/kg Subcutaneous  Q12H  . metoprolol tartrate  12.5 mg Oral BID  . pantoprazole  40 mg Oral Daily  . rosuvastatin  5 mg Oral Q72H   Continuous Infusions:   LOS: 0 days    Time spent: 25 minutes    Barb Merino, MD [facility] Pager [phone]  If 7PM-7AM, please contact night-coverage [email or web address] Password [facility] 01/03/2019, 8:38 AM

## 2019-01-03 NOTE — Progress Notes (Signed)
   Progress Note   Subjective   Doing well today, the patient denies SOB.  She had chest pain this am with radiation into her neck while making her bed. This pain lasted about 10 minutes and is now resolved.  Inpatient Medications    Scheduled Meds: . aspirin  81 mg Oral Daily  . calcium citrate  200 mg of elemental calcium Oral Daily  . cholecalciferol  1,000 Units Oral Daily  . enoxaparin (LOVENOX) injection  1 mg/kg Subcutaneous Q12H  . metoprolol tartrate  12.5 mg Oral BID  . pantoprazole  40 mg Oral Daily  . rosuvastatin  5 mg Oral Q72H   Continuous Infusions:  PRN Meds: acetaminophen, morphine injection, ondansetron (ZOFRAN) IV   Vital Signs    Vitals:   01/02/19 2145 01/03/19 0026 01/03/19 0352 01/03/19 0649  BP: 117/64 (!) 120/56 (!) 111/58   Pulse: 80 75 69   Resp: 15 20 17    Temp: 97.9 F (36.6 C) 98 F (36.7 C) 97.7 F (36.5 C)   TempSrc: Oral Oral Oral   SpO2: 97% 97% 97%   Weight:    53.6 kg  Height:        Intake/Output Summary (Last 24 hours) at 01/03/2019 0851 Last data filed at 01/03/2019 0700 Gross per 24 hour  Intake 1062 ml  Output 650 ml  Net 412 ml   Filed Weights   01/01/19 1348 01/02/19 0619 01/03/19 0649  Weight: 54.4 kg 54.4 kg 53.6 kg    Telemetry    sinus - Personally Reviewed  Physical Exam   GEN- The patient is well appearing, alert and oriented x 3 today.   Head- normocephalic, atraumatic Eyes-  Sclera clear, conjunctiva pink Ears- hearing intact Oropharynx- clear Neck- supple, Lungs- normal work of breathing Heart- Regular rate and rhythm  GI- soft, NT, ND, + BS Extremities- no clubbing, cyanosis, or edema  MS- no significant deformity or atrophy Skin- no rash or lesion Psych- euthymic mood, full affect Neuro- strength and sensation are intact   Labs    Chemistry Recent Labs  Lab 01/01/19 1353  NA 142  K 3.5  CL 101  CO2 28  GLUCOSE 121*  BUN 19  CREATININE 0.78  CALCIUM 10.6*  GFRNONAA >60  GFRAA  >60  ANIONGAP 13     Hematology Recent Labs  Lab 01/01/19 1353  WBC 10.1  RBC 4.76  HGB 14.4  HCT 43.9  MCV 92.2  MCH 30.3  MCHC 32.8  RDW 13.1  PLT 321    Cardiac Enzymes Recent Labs  Lab 01/01/19 1721 01/02/19 0216 01/02/19 0438 01/02/19 0748  TROPONINI <0.03 <0.03 <0.03 <0.03   No results for input(s): TROPIPOC in the last 168 hours.     Echo from yesterday is reviewed  Assessment & Plan    1.  Chest pain Worrisome for unstable angina. On lovenox (hold after midnight tonight) Continue ASA, statin, metoprolol. I would therefore recommend left heart catheterization with possible PCI.  Discussed the cath with the patient. The patient understands that risks included but are not limited to stroke (1 in 1000), death (1 in 66), kidney failure [usually temporary] (1 in 500), bleeding (1 in 200), allergic reaction [possibly serious] (1 in 200). The patient understands and agrees to proceed.  NPO after midnight tonight.  She has been placed on the cath board.   Thompson Grayer MD, [facility] 01/03/2019 8:51 AM

## 2019-01-04 ENCOUNTER — Encounter (HOSPITAL_COMMUNITY)
Admission: EM | Disposition: A | Discharge status: Home / Self Care | Payer: Self-pay | Source: Home / Self Care | Attending: Internal Medicine

## 2019-01-04 ENCOUNTER — Encounter (HOSPITAL_COMMUNITY): Payer: Self-pay | Admitting: Cardiology

## 2019-01-04 DIAGNOSIS — I25110 Atherosclerotic heart disease of native coronary artery with unstable angina pectoris: Principal | ICD-10-CM

## 2019-01-04 DIAGNOSIS — I200 Unstable angina: Secondary | ICD-10-CM

## 2019-01-04 HISTORY — PX: CORONARY STENT INTERVENTION: CATH118234

## 2019-01-04 HISTORY — PX: LEFT HEART CATH AND CORONARY ANGIOGRAPHY: CATH118249

## 2019-01-04 LAB — POCT ACTIVATED CLOTTING TIME
Activated Clotting Time: 340 seconds
Activated Clotting Time: 285 seconds

## 2019-01-04 SURGERY — LEFT HEART CATH AND CORONARY ANGIOGRAPHY
Anesthesia: LOCAL

## 2019-01-04 MED ORDER — ONDANSETRON HCL 4 MG/2ML IJ SOLN
INTRAMUSCULAR | Status: AC
  Filled 2019-01-04: qty 2

## 2019-01-04 MED ORDER — NITROGLYCERIN 1 MG/10 ML FOR IR/CATH LAB
INTRA_ARTERIAL | Status: DC | PRN
  Administered 2019-01-04: 200 ug
  Administered 2019-01-04 (×2): 200 ug via INTRACORONARY

## 2019-01-04 MED ORDER — FENTANYL CITRATE (PF) 100 MCG/2ML IJ SOLN
INTRAMUSCULAR | Status: AC
  Filled 2019-01-04: qty 2

## 2019-01-04 MED ORDER — LIDOCAINE HCL (PF) 1 % IJ SOLN
INTRAMUSCULAR | Status: DC | PRN
  Administered 2019-01-04: 3 mL

## 2019-01-04 MED ORDER — HEPARIN (PORCINE) IN NACL 1000-0.9 UT/500ML-% IV SOLN
INTRAVENOUS | Status: DC | PRN
  Administered 2019-01-04: 500 mL

## 2019-01-04 MED ORDER — SODIUM CHLORIDE 0.9% FLUSH: 3.0000 mL | INTRAVENOUS | Status: DC | PRN

## 2019-01-04 MED ORDER — CLOPIDOGREL BISULFATE 75 MG PO TABS
75.0000 mg | ORAL_TABLET | Freq: Every day | ORAL | Status: DC
  Administered 2019-01-05 – 2019-01-06 (×2): 75 mg via ORAL
  Filled 2019-01-04 (×2): qty 1

## 2019-01-04 MED ORDER — NITROGLYCERIN 1 MG/10 ML FOR IR/CATH LAB
INTRA_ARTERIAL | Status: AC
  Filled 2019-01-04: qty 10

## 2019-01-04 MED ORDER — SODIUM CHLORIDE 0.9 % IV SOLN: 250.0000 mL | INTRAVENOUS | Status: DC | PRN

## 2019-01-04 MED ORDER — SODIUM CHLORIDE 0.9 % IV SOLN
INTRAVENOUS | Status: AC
  Administered 2019-01-04: 13:00:00 via INTRAVENOUS

## 2019-01-04 MED ORDER — MIDAZOLAM HCL 2 MG/2ML IJ SOLN
INTRAMUSCULAR | Status: AC
  Filled 2019-01-04: qty 2

## 2019-01-04 MED ORDER — CLOPIDOGREL BISULFATE 300 MG PO TABS
ORAL_TABLET | ORAL | Status: DC | PRN
  Administered 2019-01-04: 600 mg via ORAL

## 2019-01-04 MED ORDER — FENTANYL CITRATE (PF) 100 MCG/2ML IJ SOLN
INTRAMUSCULAR | Status: DC | PRN
  Administered 2019-01-04 (×2): 12.5 ug via INTRAVENOUS

## 2019-01-04 MED ORDER — VERAPAMIL HCL 2.5 MG/ML IV SOLN
INTRAVENOUS | Status: AC
  Filled 2019-01-04: qty 2

## 2019-01-04 MED ORDER — LABETALOL HCL 5 MG/ML IV SOLN: 10.0000 mg | INTRAVENOUS | Status: AC | PRN

## 2019-01-04 MED ORDER — SODIUM CHLORIDE 0.9% FLUSH: 3.0000 mL | Freq: Two times a day (BID) | INTRAVENOUS | Status: DC

## 2019-01-04 MED ORDER — ATORVASTATIN CALCIUM 80 MG PO TABS
80.0000 mg | ORAL_TABLET | Freq: Every day | ORAL | Status: DC
  Administered 2019-01-04 – 2019-01-05 (×2): 80 mg via ORAL
  Filled 2019-01-04 (×2): qty 1

## 2019-01-04 MED ORDER — VERAPAMIL HCL 2.5 MG/ML IV SOLN
INTRAVENOUS | Status: DC | PRN
  Administered 2019-01-04: 10 mL via INTRA_ARTERIAL

## 2019-01-04 MED ORDER — HYDRALAZINE HCL 20 MG/ML IJ SOLN: 10.0000 mg | INTRAMUSCULAR | Status: AC | PRN

## 2019-01-04 MED ORDER — SODIUM CHLORIDE 0.9 % WEIGHT BASED INFUSION: 3.0000 mL/kg/h | INTRAVENOUS | Status: DC

## 2019-01-04 MED ORDER — CLOPIDOGREL BISULFATE 300 MG PO TABS
ORAL_TABLET | ORAL | Status: AC
  Filled 2019-01-04: qty 2

## 2019-01-04 MED ORDER — MIDAZOLAM HCL 2 MG/2ML IJ SOLN
INTRAMUSCULAR | Status: DC | PRN
  Administered 2019-01-04: 1 mg via INTRAVENOUS

## 2019-01-04 MED ORDER — ONDANSETRON HCL 4 MG/2ML IJ SOLN
INTRAMUSCULAR | Status: DC | PRN
  Administered 2019-01-04: 4 mg via INTRAVENOUS

## 2019-01-04 MED ORDER — SODIUM CHLORIDE 0.9 % WEIGHT BASED INFUSION
1.0000 mL/kg/h | INTRAVENOUS | Status: DC
  Administered 2019-01-05: 05:00:00 1 mL/kg/h via INTRAVENOUS

## 2019-01-04 MED ORDER — HEPARIN (PORCINE) IN NACL 1000-0.9 UT/500ML-% IV SOLN
INTRAVENOUS | Status: AC
  Filled 2019-01-04: qty 500

## 2019-01-04 MED ORDER — IOHEXOL 350 MG/ML SOLN
INTRAVENOUS | Status: DC | PRN
  Administered 2019-01-04: 165 mL via INTRA_ARTERIAL

## 2019-01-04 MED ORDER — HEPARIN SODIUM (PORCINE) 1000 UNIT/ML IJ SOLN
INTRAMUSCULAR | Status: DC | PRN
  Administered 2019-01-04: 4000 [IU] via INTRAVENOUS
  Administered 2019-01-04: 3000 [IU] via INTRAVENOUS

## 2019-01-04 MED ORDER — SODIUM CHLORIDE 0.9% FLUSH
3.0000 mL | Freq: Two times a day (BID) | INTRAVENOUS | Status: DC
  Administered 2019-01-04 (×2): 3 mL via INTRAVENOUS

## 2019-01-04 MED ORDER — HEPARIN (PORCINE) 25000 UT/250ML-% IV SOLN
650.0000 [IU]/h | INTRAVENOUS | Status: DC
  Administered 2019-01-04: 19:00:00 650 [IU]/h via INTRAVENOUS
  Filled 2019-01-04: qty 250

## 2019-01-04 MED ORDER — ALUM & MAG HYDROXIDE-SIMETH 200-200-20 MG/5ML PO SUSP
30.0000 mL | ORAL | Status: DC | PRN
  Administered 2019-01-04: 21:00:00 30 mL via ORAL
  Filled 2019-01-04: qty 30

## 2019-01-04 SURGICAL SUPPLY — 21 items
BALLN EMERGE MR 2.5X8 (BALLOONS) ×2
BALLN SAPPHIRE 1.5X15 (BALLOONS) ×2
BALLN SAPPHIRE 2.0X12 (BALLOONS) ×2
BALLN SAPPHIRE ~~LOC~~ 2.75X12 (BALLOONS) ×1 IMPLANT
BALLOON EMERGE MR 2.5X8 (BALLOONS) IMPLANT
BALLOON SAPPHIRE 1.5X15 (BALLOONS) IMPLANT
BALLOON SAPPHIRE 2.0X12 (BALLOONS) IMPLANT
CATH OPTITORQUE TIG 4.0 5F (CATHETERS) ×1 IMPLANT
CATH VISTA GUIDE 6FR XBLAD3.5 (CATHETERS) ×1 IMPLANT
DEVICE RAD COMP TR BAND LRG (VASCULAR PRODUCTS) ×1 IMPLANT
GLIDESHEATH SLEND A-KIT 6F 22G (SHEATH) ×1 IMPLANT
GUIDEWIRE INQWIRE 1.5J.035X260 (WIRE) IMPLANT
INQWIRE 1.5J .035X260CM (WIRE) ×2
KIT ENCORE 26 ADVANTAGE (KITS) ×1 IMPLANT
KIT HEART LEFT (KITS) ×2 IMPLANT
PACK CARDIAC CATHETERIZATION (CUSTOM PROCEDURE TRAY) ×2 IMPLANT
SHEATH PROBE COVER 6X72 (BAG) ×1 IMPLANT
STENT RESOLUTE ONYX 2.5X12 (Permanent Stent) ×1 IMPLANT
TRANSDUCER W/STOPCOCK (MISCELLANEOUS) ×2 IMPLANT
TUBING CIL FLEX 10 FLL-RA (TUBING) ×2 IMPLANT
WIRE ASAHI PROWATER 180CM (WIRE) ×1 IMPLANT

## 2019-01-04 NOTE — Interval H&P Note (Signed)
History and Physical Interval Note:  01/04/2019 9:36 AM  Rachel Valentine  has presented today for surgery, with the diagnosis of unstable angina.  The various methods of treatment have been discussed with the patient and family. After consideration of risks, benefits and other options for treatment, the patient has consented to  Procedure(s): LEFT HEART CATH AND CORONARY ANGIOGRAPHY (N/A) with possible PERCUTANEOUS CORONARY INTERVENTION as a surgical intervention.  The patient's history has been reviewed, patient examined, no change in status, stable for surgery.  I have reviewed the patient's chart and labs.  Questions were answered to the patient's satisfaction.    Cath Lab Visit (complete for each Cath Lab visit)  Clinical Evaluation Leading to the Procedure:   ACS: Yes.    Non-ACS:    Anginal Classification: CCS III  Anti-ischemic medical therapy: Minimal Therapy (1 class of medications)  Non-Invasive Test Results: No non-invasive testing performed  Prior CABG: No previous CABG    Glenetta Hew

## 2019-01-04 NOTE — Progress Notes (Signed)
ANTICOAGULATION CONSULT NOTE - Initial Consult  Pharmacy Consult for Heparin  Indication: 2 vessel CAD  Allergies  Allergen Reactions  . Prednisone Other (See Comments)    Blood pressure high when she takes it  . Penicillins Rash    Patient Measurements: Height: 5\' 2"  (157.5 cm) Weight: 119 lb 6.4 oz (54.2 kg) IBW/kg (Calculated) : 50.1 Heparin Dosing Weight: 54.2 kg  Vital Signs: Temp: 98 F (36.7 C) (05/04 0532) Temp Source: Oral (05/04 0532) BP: 129/73 (05/04 1145) Pulse Rate: 83 (05/04 1145)  Labs: Recent Labs    01/01/19 1353  01/02/19 0216 01/02/19 0438 01/02/19 0748  HGB 14.4  --   --   --   --   HCT 43.9  --   --   --   --   PLT 321  --   --   --   --   CREATININE 0.78  --   --   --   --   TROPONINI <0.03   < > <0.03 <0.03 <0.03   < > = values in this interval not displayed.    Estimated Creatinine Clearance: 47.3 mL/min (by C-G formula based on SCr of 0.78 mg/dL).   Medical History: Past Medical History:  Diagnosis Date  . Bowel obstruction [facility])     Assessment: 78 y.o female, S/p cath today 01/04/19. Severe 2vCAD, successful DES PCI and PTCA of distal circumflex , severe residual RCA disease that is extremely calcified and will require atherectomy.   tentatively staged for orbital atherectomy based DES PCI of the RCA with Dr. Angelena Form tomorrow afternoon following hydration Plan ASA + Plavix for min 1 year Received Lovenox 1mg /kg sq q12h, last dose given last night 5/3 at 21:27.  Now to start on IV heparin 8 hour post sheath pull. Removed at 11:00,  No bleeding or hematoma noted.    Goal of Therapy:  Heparin level 0.3-0.7 units/ml Monitor platelets by anticoagulation protocol: Yes   Plan:  At 19:00 tonight, 8 hours post sheath pull,  Start IV heparin drip, no bolus, at rate of 650 units/hr.  Check 8 hour heparin level.  Daily HL, CBC while on IV heparin.  Plan for PCI tomorrow AM  Thank you for allowing pharmacy to be part of this patients care  team. Nicole Cella, RPh Clinical Pharmacist [phone] Pager: [phone] Please check AMION for all [place] phone numbers After 10:00 PM, call [facility] [phone] 01/04/2019,12:52 PM

## 2019-01-04 NOTE — Progress Notes (Addendum)
Progress Note  Patient Name: Rachel Valentine Date of Encounter: 01/04/2019  Primary Cardiologist: New to Dr. Gwenlyn Found  Subjective   No specific complain. Pending cath today.   Inpatient Medications    Scheduled Meds: . aspirin  81 mg Oral Daily  . calcium citrate  200 mg of elemental calcium Oral Daily  . cholecalciferol  1,000 Units Oral Daily  . metoprolol tartrate  12.5 mg Oral BID  . pantoprazole  40 mg Oral Daily  . rosuvastatin  5 mg Oral Q72H  . sodium chloride flush  3 mL Intravenous Q12H   Continuous Infusions: . sodium chloride    . sodium chloride 1 mL/kg/hr (01/03/19 2035)   PRN Meds: sodium chloride, acetaminophen, morphine injection, ondansetron (ZOFRAN) IV, sodium chloride flush   Vital Signs    Vitals:   01/03/19 1306 01/03/19 2025 01/03/19 2109 01/04/19 0532  BP: (!) 130/57 125/61 118/61 126/75  Pulse: 83  81 90  Resp:      Temp: 98.1 F (36.7 C)  97.8 F (36.6 C) 98 F (36.7 C)  TempSrc: Oral  Oral Oral  SpO2: 99%  97% 99%  Weight:    54.2 kg  Height:        Intake/Output Summary (Last 24 hours) at 01/04/2019 0808 Last data filed at 01/04/2019 7371 Gross per 24 hour  Intake 1473.99 ml  Output 350 ml  Net 1123.99 ml   Last 3 Weights 01/04/2019 01/03/2019 01/02/2019  Weight (lbs) 119 lb 6.4 oz 118 lb 2.7 oz 119 lb 14.4 oz  Weight (kg) 54.159 kg 53.6 kg 54.386 kg      Telemetry    SR - Personally Reviewed  ECG    No new tracing - Personally Reviewed  Physical Exam   GEN: No acute distress.   Neck: No JVD Cardiac: RRR, no murmurs, rubs, or gallops.  Respiratory: Clear to auscultation bilaterally. GI: Soft, nontender, non-distended  MS: No edema; No deformity. Neuro:  Nonfocal  Psych: Normal affect   Labs    Chemistry Recent Labs  Lab 01/01/19 1353  NA 142  K 3.5  CL 101  CO2 28  GLUCOSE 121*  BUN 19  CREATININE 0.78  CALCIUM 10.6*  GFRNONAA >60  GFRAA >60  ANIONGAP 13     Hematology Recent Labs  Lab 01/01/19  1353  WBC 10.1  RBC 4.76  HGB 14.4  HCT 43.9  MCV 92.2  MCH 30.3  MCHC 32.8  RDW 13.1  PLT 321    Cardiac Enzymes Recent Labs  Lab 01/01/19 1721 01/02/19 0216 01/02/19 0438 01/02/19 0748  TROPONINI <0.03 <0.03 <0.03 <0.03   No results for input(s): TROPIPOC in the last 168 hours.   BNP Recent Labs  Lab 01/01/19 2058  BNP 28.5     DDimer No results for input(s): DDIMER in the last 168 hours.   Radiology    No results found.  Cardiac Studies   Echo 01/03/19  1. The left ventricle has hyperdynamic systolic function, with an ejection fraction of >65%. The cavity size was normal. Left ventricular diastolic Doppler parameters are indeterminate.  2. The right ventricle has normal systolic function. The cavity was normal. There is no increase in right ventricular wall thickness.  3. The mitral valve is grossly normal. There is mild mitral annular calcification present.  4. The aortic valve is tricuspid. Mild thickening of the aortic valve. Mild calcification of the aortic valve. No stenosis of the aortic valve.  5. The aortic root  is normal in size and structure.  6. The interatrial septum was not assessed.  Patient Profile     Rachel Valentine is a 78 y.o. female with a hx of bowel obstruction presented with progressive chest pain with exertion for the past several months. COVID negative.   She was scheduled to see Dr. Gwenlyn Found for evaluation in March but this was rescheduled due to [place]    1. Unstable angina - Troponin remained negative. Echo with normal LVEF. Lovenox on hold. On ASA,  and BB. For cath today.  - LDL 85. On Crestor 5mg  every 3 days. Please review if  CAD.     For questions or updates, please contact [place] Please consult [email or web address] for contact info under        Signed, Leanor Kail, PA  01/04/2019, 8:08 AM    Agree with note by Robbie Lis PA-C  Ms Calclasure was apparently scheduled to see me in the  office a month ago for chest pain.  She was admitted with accelerated angina.  Her enzymes are negative.  EKG shows no acute changes.  She is scheduled for cardiac catheterization today.  Her exam is benign.  [name], M.D., [place], [facility], [place] [address] [place], [place]  [id number]  [phone] 01/04/2019 10:45 AM

## 2019-01-04 NOTE — Progress Notes (Signed)
PROGRESS NOTE    Rachel Valentine  KYH:[id number] DOB: May 23, 1941 DOA: 01/01/2019 PCP: Angelina Pih, MD    Brief Narrative:   78 year old female with history of hyperlipidemia, no history of coronary artery disease presented to the emergency room with 3 months of intermittent chest pain, now worsening and persistent and more frequent symptoms.  She had tried Prilosec with no improvement.  EKG and troponin are nonischemic.  Cath 01/04/2019  Severe two-vessel CAD involving a focal 90% mid circumflex followed by 80% distal circumflex as well as diffuse calcified 99%, 80% and 90% stenoses in the proximal, mid, distal RCA.  Successful DES PCI of mid circumflex 90% lesion using resolute DES 2.5 mm x 12 mm (2.8 mm), and PTCA only of the distal circumflex reducing 80% stenosis to 30% using 1.5 mm balloon.  Preserved LVEF with normal LVEDP.  Assessment & Plan:   Active Problems:   Chest pain   Elevated BP without diagnosis of hypertension  Unstable angina with severe two vessel coronary artery disease: Status post cardiac cath with PCI and drug-eluting stent placed on mid circumflex and distal circumflex. Severe residual RCA disease, plan for atherectomy tomorrow.  Hydrating for contrast use. On aspirin Plavix. High intensity statin. Aggressive blood pressure management.  Cardiac rehab referral made.  Hyperlipidemia: On Crestor. Changing to high intensity statin with Lipitor 80 mg daily.   Hypertension: Previously untreated.  Controlled with metoprolol.   DVT prophylaxis: Lovenox Code Status: Full code Family Communication: None Disposition Plan: Telemetry   Consultants:   Cardiology  Procedures:   Cardiac cath today.  Results above.  Antimicrobials:   None   Subjective: Patient was seen and examined.  No more chest pain since yesterday morning.  Going for cardiac cath. Objective: Vitals:   01/04/19 1050 01/04/19 1055 01/04/19 1100 01/04/19 1145  BP: (!)  182/83 (!) 176/81 (!) 167/85 129/73  Pulse: 79 80 75 83  Resp: 19 13 (!) 9   Temp:      TempSrc:      SpO2: 100% 100% 100%   Weight:      Height:        Intake/Output Summary (Last 24 hours) at 01/04/2019 1251 Last data filed at 01/04/2019 1141 Gross per 24 hour  Intake 1532.37 ml  Output 850 ml  Net 682.37 ml   Filed Weights   01/02/19 0619 01/03/19 0649 01/04/19 0532  Weight: 54.4 kg 53.6 kg 54.2 kg    Examination:  General exam: Appears calm and comfortable  Respiratory system: Clear to auscultation. Respiratory effort normal. Cardiovascular system: S1 & S2 heard, RRR. No JVD, murmurs, rubs, gallops or clicks. No pedal edema. Gastrointestinal system: Abdomen is nondistended, soft and nontender. No organomegaly or masses felt. Normal bowel sounds heard. Central nervous system: Alert and oriented. No focal neurological deficits. Extremities: Symmetric 5 x 5 power. Skin: No rashes, lesions or ulcers Psychiatry: Judgement and insight appear normal. Mood & affect appropriate.     Data Reviewed: I have personally reviewed following labs and imaging studies  CBC: Recent Labs  Lab 01/01/19 1353  WBC 10.1  HGB 14.4  HCT 43.9  MCV 92.2  PLT 151   Basic Metabolic Panel: Recent Labs  Lab 01/01/19 1353 01/01/19 2052  NA 142  --   K 3.5  --   CL 101  --   CO2 28  --   GLUCOSE 121*  --   BUN 19  --   CREATININE 0.78  --   CALCIUM 10.6*  --  MG  --  2.3  PHOS  --  3.5   GFR: Estimated Creatinine Clearance: 47.3 mL/min (by C-G formula based on SCr of 0.78 mg/dL). Liver Function Tests: No results for input(s): AST, ALT, ALKPHOS, BILITOT, PROT, ALBUMIN in the last 168 hours. No results for input(s): LIPASE, AMYLASE in the last 168 hours. No results for input(s): AMMONIA in the last 168 hours. Coagulation Profile: No results for input(s): INR, PROTIME in the last 168 hours. Cardiac Enzymes: Recent Labs  Lab 01/01/19 1353 01/01/19 1721 01/02/19 0216 01/02/19  0438 01/02/19 0748  TROPONINI <0.03 <0.03 <0.03 <0.03 <0.03   BNP (last 3 results) No results for input(s): PROBNP in the last 8760 hours. HbA1C: Recent Labs    01/02/19 0216  HGBA1C 5.5   CBG: No results for input(s): GLUCAP in the last 168 hours. Lipid Profile: Recent Labs    01/02/19 0216  CHOL 178  HDL 79  LDLCALC 85  TRIG 70  CHOLHDL 2.3   Thyroid Function Tests: Recent Labs    01/01/19 2111  TSH 3.976   Anemia Panel: No results for input(s): VITAMINB12, FOLATE, FERRITIN, TIBC, IRON, RETICCTPCT in the last 72 hours. Sepsis Labs: No results for input(s): PROCALCITON, LATICACIDVEN in the last 168 hours.  Recent Results (from the past 240 hour(s))  SARS Coronavirus 2 (CEPHEID - Performed in [place] hospital lab), Hosp Order     Status: None   Collection Time: 01/01/19  9:54 PM  Result Value Ref Range Status   SARS Coronavirus 2 NEGATIVE NEGATIVE Final    Comment: (NOTE) If result is NEGATIVE SARS-CoV-2 target nucleic acids are NOT DETECTED. The SARS-CoV-2 RNA is generally detectable in upper and lower  respiratory specimens during the acute phase of infection. The lowest  concentration of SARS-CoV-2 viral copies this assay can detect is 250  copies / mL. A negative result does not preclude SARS-CoV-2 infection  and should not be used as the sole basis for treatment or other  patient management decisions.  A negative result may occur with  improper specimen collection / handling, submission of specimen other  than nasopharyngeal swab, presence of viral mutation(s) within the  areas targeted by this assay, and inadequate number of viral copies  (<250 copies / mL). A negative result must be combined with clinical  observations, patient history, and epidemiological information. If result is POSITIVE SARS-CoV-2 target nucleic acids are DETECTED. The SARS-CoV-2 RNA is generally detectable in upper and lower  respiratory specimens dur ing the acute phase of  infection.  Positive  results are indicative of active infection with SARS-CoV-2.  Clinical  correlation with patient history and other diagnostic information is  necessary to determine patient infection status.  Positive results do  not rule out bacterial infection or co-infection with other viruses. If result is PRESUMPTIVE POSTIVE SARS-CoV-2 nucleic acids MAY BE PRESENT.   A presumptive positive result was obtained on the submitted specimen  and confirmed on repeat testing.  While 2019 novel coronavirus  (SARS-CoV-2) nucleic acids may be present in the submitted sample  additional confirmatory testing may be necessary for epidemiological  and / or clinical management purposes  to differentiate between  SARS-CoV-2 and other Sarbecovirus currently known to infect humans.  If clinically indicated additional testing with an alternate test  methodology [id number]) is advised. The SARS-CoV-2 RNA is generally  detectable in upper and lower respiratory sp ecimens during the acute  phase of infection. The expected result is Negative. Fact Sheet for Patients:  StrictlyIdeas.no Fact Sheet for Healthcare Providers: BankingDealers.co.za This test is not yet approved or cleared by the [place] FDA and has been authorized for detection and/or diagnosis of SARS-CoV-2 by FDA under an Emergency Use Authorization (EUA).  This EUA will remain in effect (meaning this test can be used) for the duration of the COVID-19 declaration under Section 564(b)(1) of the Act, 21 U.S.C. section 360bbb-3(b)(1), unless the authorization is terminated or revoked sooner. Performed at [facility], [place] [address], [place], [place] [address]          Radiology Studies: No results found.      Scheduled Meds: . aspirin  81 mg Oral Daily  . atorvastatin  80 mg Oral q1800  . calcium citrate  200 mg of elemental calcium Oral Daily  . cholecalciferol   1,000 Units Oral Daily  . [START ON 01/05/2019] clopidogrel  75 mg Oral Q breakfast  . metoprolol tartrate  12.5 mg Oral BID  . pantoprazole  40 mg Oral Daily  . sodium chloride flush  3 mL Intravenous Q12H  . sodium chloride flush  3 mL Intravenous Q12H   Continuous Infusions: . sodium chloride 100 mL/hr at 01/04/19 1141  . sodium chloride    . sodium chloride    . [START ON 01/05/2019] sodium chloride     Followed by  . [START ON 01/05/2019] sodium chloride       LOS: 1 day    Time spent: 25 minutes    Barb Merino, MD [facility] Pager [phone]  If 7PM-7AM, please contact night-coverage [email or web address] Password [email or web address] 01/04/2019, 12:51 PM

## 2019-01-04 NOTE — Progress Notes (Signed)
Some bleeding at rt radial cath site.  + ecchymosis mild tenderness, fingers nice and warm.  No numbness or tingling  For cath tomorrow.

## 2019-01-05 ENCOUNTER — Encounter (HOSPITAL_COMMUNITY)
Admission: EM | Disposition: A | Discharge status: Home / Self Care | Payer: Self-pay | Source: Home / Self Care | Attending: Internal Medicine

## 2019-01-05 HISTORY — PX: TEMPORARY PACEMAKER: CATH118268

## 2019-01-05 HISTORY — PX: CORONARY ATHERECTOMY: CATH118238

## 2019-01-05 LAB — BASIC METABOLIC PANEL
Anion gap: 8 (ref 5–15)
BUN: 16 mg/dL (ref 8–23)
CO2: 22 mmol/L (ref 22–32)
Calcium: 8.7 mg/dL — ABNORMAL LOW (ref 8.9–10.3)
Chloride: 108 mmol/L (ref 98–111)
Creatinine, Ser: 0.76 mg/dL (ref 0.44–1.00)
GFR calc Af Amer: >60 mL/min (ref >60)
GFR calc non Af Amer: >60 mL/min (ref >60)
Glucose, Bld: 126 mg/dL — ABNORMAL HIGH (ref 70–99)
Potassium: 3.9 mmol/L (ref 3.5–5.1)
Sodium: 138 mmol/L (ref 135–145)

## 2019-01-05 LAB — CBC
HCT: 36 % (ref 36.0–46.0)
Hemoglobin: 11.9 g/dL — ABNORMAL LOW (ref 12.0–15.0)
MCH: 30.1 pg (ref 26.0–34.0)
MCHC: 33.1 g/dL (ref 30.0–36.0)
MCV: 91.1 fL (ref 80.0–100.0)
Platelets: 242 10*3/uL (ref 150–400)
RBC: 3.95 MIL/uL (ref 3.87–5.11)
RDW: 12.8 % (ref 11.5–15.5)
WBC: 9.4 10*3/uL (ref 4.0–10.5)
nRBC: 0 % (ref 0.0–0.2)

## 2019-01-05 LAB — HEPARIN LEVEL (UNFRACTIONATED)
Heparin Unfractionated: 0.42 IU/mL (ref 0.30–0.70)
Heparin Unfractionated: 0.45 IU/mL (ref 0.30–0.70)

## 2019-01-05 LAB — POCT ACTIVATED CLOTTING TIME
Activated Clotting Time: 571 seconds
Activated Clotting Time: 318 seconds
Activated Clotting Time: 241 seconds
Activated Clotting Time: 186 seconds

## 2019-01-05 SURGERY — CORONARY ATHERECTOMY
Anesthesia: LOCAL

## 2019-01-05 MED ORDER — HEPARIN (PORCINE) IN NACL 1000-0.9 UT/500ML-% IV SOLN
INTRAVENOUS | Status: AC
  Filled 2019-01-05: qty 500

## 2019-01-05 MED ORDER — VERAPAMIL HCL 2.5 MG/ML IV SOLN
INTRAVENOUS | Status: DC | PRN
  Administered 2019-01-05: 10 mL via INTRA_ARTERIAL

## 2019-01-05 MED ORDER — NITROGLYCERIN 0.4 MG SL SUBL: 0.4000 mg | SUBLINGUAL_TABLET | SUBLINGUAL | Status: DC | PRN

## 2019-01-05 MED ORDER — MIDAZOLAM HCL 2 MG/2ML IJ SOLN
INTRAMUSCULAR | Status: AC
  Filled 2019-01-05: qty 2

## 2019-01-05 MED ORDER — SODIUM CHLORIDE 0.9 % IV SOLN: INTRAVENOUS | Status: AC

## 2019-01-05 MED ORDER — DOPAMINE-DEXTROSE 3.2-5 MG/ML-% IV SOLN
INTRAVENOUS | Status: DC | PRN
  Administered 2019-01-05: 5 ug/kg/min via INTRAVENOUS

## 2019-01-05 MED ORDER — VIPERSLIDE LUBRICANT OPTIME
TOPICAL | Status: DC | PRN
  Administered 2019-01-05: 15:00:00 via SURGICAL_CAVITY

## 2019-01-05 MED ORDER — ANGIOPLASTY BOOK
Freq: Once | Status: AC
  Administered 2019-01-05: 22:00:00
  Filled 2019-01-05: qty 1

## 2019-01-05 MED ORDER — HEPARIN SODIUM (PORCINE) 1000 UNIT/ML IJ SOLN
INTRAMUSCULAR | Status: AC
  Filled 2019-01-05: qty 1

## 2019-01-05 MED ORDER — HEPARIN (PORCINE) IN NACL 1000-0.9 UT/500ML-% IV SOLN
INTRAVENOUS | Status: AC
  Filled 2019-01-05: qty 1000

## 2019-01-05 MED ORDER — SODIUM CHLORIDE 0.9 % IV SOLN
INTRAVENOUS | Status: AC | PRN
  Administered 2019-01-05: 10 mL/h via INTRAVENOUS

## 2019-01-05 MED ORDER — SODIUM CHLORIDE 0.9% FLUSH
3.0000 mL | Freq: Two times a day (BID) | INTRAVENOUS | Status: DC
  Administered 2019-01-05: 20:00:00 3 mL via INTRAVENOUS

## 2019-01-05 MED ORDER — MIDAZOLAM HCL 2 MG/2ML IJ SOLN
INTRAMUSCULAR | Status: DC | PRN
  Administered 2019-01-05 (×2): 1 mg via INTRAVENOUS

## 2019-01-05 MED ORDER — HEPARIN (PORCINE) IN NACL 1000-0.9 UT/500ML-% IV SOLN
INTRAVENOUS | Status: DC | PRN
  Administered 2019-01-05 (×3): 500 mL

## 2019-01-05 MED ORDER — HYDRALAZINE HCL 20 MG/ML IJ SOLN
INTRAMUSCULAR | Status: DC | PRN
  Administered 2019-01-05: 10 mg via INTRAVENOUS

## 2019-01-05 MED ORDER — HYDRALAZINE HCL 20 MG/ML IJ SOLN
INTRAMUSCULAR | Status: AC
  Filled 2019-01-05: qty 1

## 2019-01-05 MED ORDER — FENTANYL CITRATE (PF) 100 MCG/2ML IJ SOLN
INTRAMUSCULAR | Status: AC
  Filled 2019-01-05: qty 2

## 2019-01-05 MED ORDER — LIDOCAINE HCL (PF) 1 % IJ SOLN
INTRAMUSCULAR | Status: AC
  Filled 2019-01-05: qty 30

## 2019-01-05 MED ORDER — HYDRALAZINE HCL 20 MG/ML IJ SOLN: 10.0000 mg | INTRAMUSCULAR | Status: AC | PRN

## 2019-01-05 MED ORDER — VERAPAMIL HCL 2.5 MG/ML IV SOLN
INTRAVENOUS | Status: AC
  Filled 2019-01-05: qty 2

## 2019-01-05 MED ORDER — HEPARIN SODIUM (PORCINE) 1000 UNIT/ML IJ SOLN
INTRAMUSCULAR | Status: DC | PRN
  Administered 2019-01-05: 8000 [IU] via INTRAVENOUS

## 2019-01-05 MED ORDER — LIDOCAINE HCL (PF) 1 % IJ SOLN
INTRAMUSCULAR | Status: DC | PRN
  Administered 2019-01-05: 10 mL
  Administered 2019-01-05: 2 mL

## 2019-01-05 MED ORDER — NITROGLYCERIN 0.4 MG SL SUBL
SUBLINGUAL_TABLET | SUBLINGUAL | Status: AC
  Administered 2019-01-05: 0.4 mg
  Filled 2019-01-05: qty 3

## 2019-01-05 MED ORDER — DOPAMINE-DEXTROSE 3.2-5 MG/ML-% IV SOLN
INTRAVENOUS | Status: AC
  Filled 2019-01-05: qty 250

## 2019-01-05 MED ORDER — SODIUM CHLORIDE 0.9% FLUSH: 3.0000 mL | INTRAVENOUS | Status: DC | PRN

## 2019-01-05 MED ORDER — SODIUM CHLORIDE 0.9 % IV SOLN
INTRAVENOUS | Status: DC
  Administered 2019-01-05: 01:00:00 via INTRAVENOUS

## 2019-01-05 MED ORDER — IOHEXOL 350 MG/ML SOLN
INTRAVENOUS | Status: DC | PRN
  Administered 2019-01-05: 70 mL via INTRAVENOUS

## 2019-01-05 MED ORDER — LIDOCAINE VISCOUS HCL 2 % MT SOLN
15.0000 mL | Freq: Once | OROMUCOSAL | Status: AC
  Administered 2019-01-05: 01:00:00 15 mL via ORAL
  Filled 2019-01-05: qty 15

## 2019-01-05 MED ORDER — SODIUM CHLORIDE 0.9 % IV SOLN: 250.0000 mL | INTRAVENOUS | Status: DC | PRN

## 2019-01-05 MED ORDER — LABETALOL HCL 5 MG/ML IV SOLN: 10.0000 mg | INTRAVENOUS | Status: AC | PRN

## 2019-01-05 MED ORDER — ALUM & MAG HYDROXIDE-SIMETH 200-200-20 MG/5ML PO SUSP
30.0000 mL | Freq: Once | ORAL | Status: AC
  Administered 2019-01-05: 30 mL via ORAL
  Filled 2019-01-05: qty 30

## 2019-01-05 SURGICAL SUPPLY — 26 items
BALLN SAPPHIRE 2.0X15 (BALLOONS) ×2
BALLN SAPPHIRE ~~LOC~~ 2.75X18 (BALLOONS) ×1 IMPLANT
BALLOON SAPPHIRE 2.0X15 (BALLOONS) IMPLANT
CABLE ADAPT CONN TEMP 6FT (ADAPTER) ×1 IMPLANT
CATH LAUNCHER 6FR AL.75 (CATHETERS) ×1 IMPLANT
CATH S G BIP PACING (CATHETERS) ×1 IMPLANT
CATH TELEPORT (CATHETERS) ×1 IMPLANT
COVER DOME SNAP 22 D (MISCELLANEOUS) ×1 IMPLANT
CROWN DIAMONDBACK CLASSIC 1.25 (BURR) ×1 IMPLANT
DEVICE RAD COMP TR BAND LRG (VASCULAR PRODUCTS) ×1 IMPLANT
ELECT DEFIB PAD ADLT CADENCE (PAD) ×1 IMPLANT
GLIDESHEATH SLEND SS 6F .021 (SHEATH) ×1 IMPLANT
GUIDEWIRE INQWIRE 1.5J.035X260 (WIRE) IMPLANT
INQWIRE 1.5J .035X260CM (WIRE) ×2
KIT ENCORE 26 ADVANTAGE (KITS) ×1 IMPLANT
KIT HEART LEFT (KITS) ×2 IMPLANT
PACK CARDIAC CATHETERIZATION (CUSTOM PROCEDURE TRAY) ×2 IMPLANT
SHEATH PINNACLE 6F 10CM (SHEATH) ×1 IMPLANT
SHEATH PROBE COVER 6X72 (BAG) ×1 IMPLANT
STENT SYNERGY DES 2.5X38 (Permanent Stent) ×1 IMPLANT
STENT SYNERGY DES 2.75X16 (Permanent Stent) ×1 IMPLANT
TRANSDUCER W/STOPCOCK (MISCELLANEOUS) ×2 IMPLANT
TUBING CIL FLEX 10 FLL-RA (TUBING) ×2 IMPLANT
WIRE COUGAR XT STRL 190CM (WIRE) ×1 IMPLANT
WIRE COUGAR XT STRL 300CM (WIRE) ×1 IMPLANT
WIRE VIPERWIRE COR FLEX .012 (WIRE) ×1 IMPLANT

## 2019-01-05 NOTE — Progress Notes (Signed)
PROGRESS NOTE    Rachel Valentine  YDX:[id number] DOB: 03/24/1941 DOA: 01/01/2019 PCP: Angelina Pih, MD    Brief Narrative:   78 year old female with history of hyperlipidemia, no history of coronary artery disease presented to the emergency room with 3 months of intermittent chest pain, now worsening and persistent and more frequent symptoms.  She had tried Prilosec with no improvement.  EKG and troponin are nonischemic. Cath 01/04/2019  Severe two-vessel CAD involving a focal 90% mid circumflex followed by 80% distal circumflex as well as diffuse calcified 99%, 80% and 90% stenoses in the proximal, mid, distal RCA.  Successful DES PCI of mid circumflex 90% lesion using resolute DES 2.5 mm x 12 mm (2.8 mm), and PTCA only of the distal circumflex reducing 80% stenosis to 30% using 1.5 mm balloon.  Preserved LVEF with normal LVEDP.  Scheduled for atherectomy today.  Assessment & Plan:   Active Problems:   Chest pain   Elevated BP without diagnosis of hypertension   Unstable angina (HCC)  Unstable angina with severe two vessel coronary artery disease: Status post cardiac cath with PCI and drug-eluting stent placed on mid circumflex and distal circumflex. Severe residual RCA disease, plan for atherectomy today.  Hydrating for contrast use.  Currently on heparin infusion. On aspirin, Plavix. High intensity statin. Aggressive blood pressure management.  Cardiac rehab referral made.  Hyperlipidemia: On Crestor. Changing to high intensity statin with Lipitor 80 mg daily.   Hypertension: Previously untreated.  Controlled with metoprolol.   DVT prophylaxis: Heparin infusion. Code Status: Full code Family Communication: None Disposition Plan: Telemetry   Consultants:   Cardiology  Procedures:   Cardiac cath   Orbital atherectomy scheduled for today  Antimicrobials:   None   Subjective: Patient was seen and examined.  No more chest pain since yesterday  morning.  She had an episode of indigestion last night that felt different than her usual chest pain.  Today without symptoms.   Objective: Vitals:   01/05/19 0040 01/05/19 0044 01/05/19 0628 01/05/19 1017  BP:  (!) 142/74 134/68 137/64  Pulse:  73 75 91  Resp:      Temp:  97.9 F (36.6 C) 98.1 F (36.7 C)   TempSrc:  Oral Oral   SpO2: 96%  98%   Weight:   55.2 kg   Height:        Intake/Output Summary (Last 24 hours) at 01/05/2019 1339 Last data filed at 01/05/2019 1100 Gross per 24 hour  Intake 841.26 ml  Output 900 ml  Net -58.74 ml   Filed Weights   01/03/19 0649 01/04/19 0532 01/05/19 0628  Weight: 53.6 kg 54.2 kg 55.2 kg    Examination:  General exam: Appears calm and comfortable  Respiratory system: Clear to auscultation. Respiratory effort normal. Cardiovascular system: S1 & S2 heard, RRR. No JVD, murmurs, rubs, gallops or clicks. No pedal edema. Gastrointestinal system: Abdomen is nondistended, soft and nontender. No organomegaly or masses felt. Normal bowel sounds heard. Central nervous system: Alert and oriented. No focal neurological deficits. Extremities: Symmetric 5 x 5 power. Skin: No rashes, lesions or ulcers Psychiatry: Judgement and insight appear normal. Mood & affect appropriate.     Data Reviewed: I have personally reviewed following labs and imaging studies  CBC: Recent Labs  Lab 01/01/19 1353 01/05/19 0255  WBC 10.1 9.4  HGB 14.4 11.9*  HCT 43.9 36.0  MCV 92.2 91.1  PLT 321 720   Basic Metabolic Panel: Recent Labs  Lab 01/01/19 1353  01/01/19 2052 01/05/19 0255  NA 142  --  138  K 3.5  --  3.9  CL 101  --  108  CO2 28  --  22  GLUCOSE 121*  --  126*  BUN 19  --  16  CREATININE 0.78  --  0.76  CALCIUM 10.6*  --  8.7*  MG  --  2.3  --   PHOS  --  3.5  --    GFR: Estimated Creatinine Clearance: 47.3 mL/min (by C-G formula based on SCr of 0.76 mg/dL). Liver Function Tests: No results for input(s): AST, ALT, ALKPHOS, BILITOT,  PROT, ALBUMIN in the last 168 hours. No results for input(s): LIPASE, AMYLASE in the last 168 hours. No results for input(s): AMMONIA in the last 168 hours. Coagulation Profile: No results for input(s): INR, PROTIME in the last 168 hours. Cardiac Enzymes: Recent Labs  Lab 01/01/19 1353 01/01/19 1721 01/02/19 0216 01/02/19 0438 01/02/19 0748  TROPONINI <0.03 <0.03 <0.03 <0.03 <0.03   BNP (last 3 results) No results for input(s): PROBNP in the last 8760 hours. HbA1C: No results for input(s): HGBA1C in the last 72 hours. CBG: No results for input(s): GLUCAP in the last 168 hours. Lipid Profile: No results for input(s): CHOL, HDL, LDLCALC, TRIG, CHOLHDL, LDLDIRECT in the last 72 hours. Thyroid Function Tests: No results for input(s): TSH, T4TOTAL, FREET4, T3FREE, THYROIDAB in the last 72 hours. Anemia Panel: No results for input(s): VITAMINB12, FOLATE, FERRITIN, TIBC, IRON, RETICCTPCT in the last 72 hours. Sepsis Labs: No results for input(s): PROCALCITON, LATICACIDVEN in the last 168 hours.  Recent Results (from the past 240 hour(s))  SARS Coronavirus 2 (CEPHEID - Performed in [place] hospital lab), Hosp Order     Status: None   Collection Time: 01/01/19  9:54 PM  Result Value Ref Range Status   SARS Coronavirus 2 NEGATIVE NEGATIVE Final    Comment: (NOTE) If result is NEGATIVE SARS-CoV-2 target nucleic acids are NOT DETECTED. The SARS-CoV-2 RNA is generally detectable in upper and lower  respiratory specimens during the acute phase of infection. The lowest  concentration of SARS-CoV-2 viral copies this assay can detect is 250  copies / mL. A negative result does not preclude SARS-CoV-2 infection  and should not be used as the sole basis for treatment or other  patient management decisions.  A negative result may occur with  improper specimen collection / handling, submission of specimen other  than nasopharyngeal swab, presence of viral mutation(s) within the  areas  targeted by this assay, and inadequate number of viral copies  (<250 copies / mL). A negative result must be combined with clinical  observations, patient history, and epidemiological information. If result is POSITIVE SARS-CoV-2 target nucleic acids are DETECTED. The SARS-CoV-2 RNA is generally detectable in upper and lower  respiratory specimens dur ing the acute phase of infection.  Positive  results are indicative of active infection with SARS-CoV-2.  Clinical  correlation with patient history and other diagnostic information is  necessary to determine patient infection status.  Positive results do  not rule out bacterial infection or co-infection with other viruses. If result is PRESUMPTIVE POSTIVE SARS-CoV-2 nucleic acids MAY BE PRESENT.   A presumptive positive result was obtained on the submitted specimen  and confirmed on repeat testing.  While 2019 novel coronavirus  (SARS-CoV-2) nucleic acids may be present in the submitted sample  additional confirmatory testing may be necessary for epidemiological  and / or clinical management purposes  to  differentiate between  SARS-CoV-2 and other Sarbecovirus currently known to infect humans.  If clinically indicated additional testing with an alternate test  methodology [phone]) is advised. The SARS-CoV-2 RNA is generally  detectable in upper and lower respiratory sp ecimens during the acute  phase of infection. The expected result is Negative. Fact Sheet for Patients:  StrictlyIdeas.no Fact Sheet for Healthcare Providers: BankingDealers.co.za This test is not yet approved or cleared by the [place] FDA and has been authorized for detection and/or diagnosis of SARS-CoV-2 by FDA under an Emergency Use Authorization (EUA).  This EUA will remain in effect (meaning this test can be used) for the duration of the COVID-19 declaration under Section 564(b)(1) of the Act, 21 U.S.C. section  360bbb-3(b)(1), unless the authorization is terminated or revoked sooner. Performed at [facility], [place] [address] [place], [place] [address]          Radiology Studies: No results found.      Scheduled Meds: . aspirin  81 mg Oral Daily  . atorvastatin  80 mg Oral q1800  . calcium citrate  200 mg of elemental calcium Oral Daily  . cholecalciferol  1,000 Units Oral Daily  . clopidogrel  75 mg Oral Q breakfast  . metoprolol tartrate  12.5 mg Oral BID  . pantoprazole  40 mg Oral Daily  . sodium chloride flush  3 mL Intravenous Q12H  . sodium chloride flush  3 mL Intravenous Q12H   Continuous Infusions: . sodium chloride    . sodium chloride    . sodium chloride Stopped (01/05/19 0409)  . sodium chloride 1 mL/kg/hr (01/05/19 0529)  . heparin 650 Units/hr (01/04/19 1922)     LOS: 2 days    Time spent: 25 minutes    Barb Merino, MD [facility] Pager [phone]  If 7PM-7AM, please contact night-coverage [email or web address] Password [email or web address] 01/05/2019, 1:39 PM

## 2019-01-05 NOTE — H&P (View-Only) (Signed)
Progress Note  Patient Name: Rachel Valentine Date of Encounter: 01/05/2019  Primary Cardiologist: New to Dr. Gwenlyn Found  Subjective   Complains of some chest burning this morning.  Postop day 1 circumflex intervention by Dr. Ellyn Hack of the mid AV groove circumflex with residual distal disease.  Scheduled for calcified RCA orbital atherectomy by Dr. Angelena Form this morning.  Inpatient Medications    Scheduled Meds: . aspirin  81 mg Oral Daily  . atorvastatin  80 mg Oral q1800  . calcium citrate  200 mg of elemental calcium Oral Daily  . cholecalciferol  1,000 Units Oral Daily  . clopidogrel  75 mg Oral Q breakfast  . metoprolol tartrate  12.5 mg Oral BID  . pantoprazole  40 mg Oral Daily  . sodium chloride flush  3 mL Intravenous Q12H  . sodium chloride flush  3 mL Intravenous Q12H   Continuous Infusions: . sodium chloride    . sodium chloride    . sodium chloride Stopped (01/05/19 0409)  . sodium chloride 1 mL/kg/hr (01/05/19 0529)  . heparin 650 Units/hr (01/04/19 1922)   PRN Meds: sodium chloride, sodium chloride, acetaminophen, alum & mag hydroxide-simeth, morphine injection, nitroGLYCERIN, ondansetron (ZOFRAN) IV, sodium chloride flush, sodium chloride flush   Vital Signs    Vitals:   01/05/19 0033 01/05/19 0040 01/05/19 0044 01/05/19 0628  BP: 140/74  (!) 142/74 134/68  Pulse: 80  73 75  Resp:      Temp:   97.9 F (36.6 C) 98.1 F (36.7 C)  TempSrc:   Oral Oral  SpO2:  96%  98%  Weight:    55.2 kg  Height:        Intake/Output Summary (Last 24 hours) at 01/05/2019 0814 Last data filed at 01/05/2019 0700 Gross per 24 hour  Intake 824.38 ml  Output 1400 ml  Net -575.62 ml   Last 3 Weights 01/05/2019 01/04/2019 01/03/2019  Weight (lbs) 121 lb 11.2 oz 119 lb 6.4 oz 118 lb 2.7 oz  Weight (kg) 55.203 kg 54.159 kg 53.6 kg      Telemetry    Normal sinus rhythm- Personally Reviewed  ECG    Sinus rhythm at 78 without ST or T wave changes- Personally Reviewed   Physical Exam   GEN: No acute distress.   Neck: No JVD Cardiac: RRR, no murmurs, rubs, or gallops.  Respiratory: Clear to auscultation bilaterally. GI: Soft, nontender, non-distended  MS: No edema; No deformity. Neuro:  Nonfocal  Psych: Normal affect  Extremities: Ecchymosis right radial puncture site  Labs    Chemistry Recent Labs  Lab 01/01/19 1353 01/05/19 0255  NA 142 138  K 3.5 3.9  CL 101 108  CO2 28 22  GLUCOSE 121* 126*  BUN 19 16  CREATININE 0.78 0.76  CALCIUM 10.6* 8.7*  GFRNONAA >60 >60  GFRAA >60 >60  ANIONGAP 13 8     Hematology Recent Labs  Lab 01/01/19 1353 01/05/19 0255  WBC 10.1 9.4  RBC 4.76 3.95  HGB 14.4 11.9*  HCT 43.9 36.0  MCV 92.2 91.1  MCH 30.3 30.1  MCHC 32.8 33.1  RDW 13.1 12.8  PLT 321 242    Cardiac Enzymes Recent Labs  Lab 01/01/19 1721 01/02/19 0216 01/02/19 0438 01/02/19 0748  TROPONINI <0.03 <0.03 <0.03 <0.03   No results for input(s): TROPIPOC in the last 168 hours.   BNP Recent Labs  Lab 01/01/19 2058  BNP 28.5     DDimer No results for input(s): DDIMER in  the last 168 hours.   Radiology    No results found.  Cardiac Studies   Echo 01/03/19 1. The left ventricle has hyperdynamic systolic function, with an ejection fraction of >65%. The cavity size was normal. Left ventricular diastolic Doppler parameters are indeterminate. 2. The right ventricle has normal systolic function. The cavity was normal. There is no increase in right ventricular wall thickness. 3. The mitral valve is grossly normal. There is mild mitral annular calcification present. 4. The aortic valve is tricuspid. Mild thickening of the aortic valve. Mild calcification of the aortic valve. No stenosis of the aortic valve. 5. The aortic root is normal in size and structure. 6. The interatrial septum was not assessed.  Patient Profile     Rachel Stamant Culclasureis a 78 y.o.femalewith a hx of bowel obstructionpresented with  progressive chest pain with exertion for the past several months. COVID negative.   She was scheduled to see Dr. Gwenlyn Found for evaluation in [place] this was rescheduled due to [place]    1. Unstable angina/CAD - Troponin remained negative. Echo with normal LVEF.  - Cath showed Severe two-vessel CAD involving a focal 90% mid circumflex followed by 80% distal circumflex. -S/p Successful DES PCI of mid circumflex 90% lesion using resolute DES 2.5 mm x 12 mm (2.8 mm), and PTCA only of the distal circumflex reducing 80% stenosis to 30% using 1.5 mm balloon. - Plan for staged atherectomy for severe RCA disease today.  - Continue ASA, Plavix, statin, IV heparin and BB.   2. HLD - 01/02/2019: Cholesterol 178; HDL 79; LDL Cholesterol 85; Triglycerides 70; VLDL 14  - Changed low dose Crestor to Lipitor 80mg  daily.  - Repeat labs in 6 weeks.   3. Elevated BP - Improving on BB      For questions or updates, please contact [place] Please consult [email or web address] for contact info under        Signed, Leanor Kail, PA  01/05/2019, 8:14 AM    Agree with note by Robbie Lis PA-C  Postop day #1 circumflex intervention by Dr. Ellyn Hack with drug-eluting stent.  There was residual distal disease.  The LAD had only mild disease.  The RCA had diffuse disease and highly calcified vessel.  The LV function was normal by 2D echo.  Enzymes were negative.  EKG shows no acute changes.  The patient is on IV heparin.  Plan for diamondback orbital rotational atherectomy, PCI and drug-eluting stenting by Dr. Angelena Form later today.  [name], M.D., [place], [facility], [place] [address] [place],   [address]  [phone] 01/05/2019 9:25 AM

## 2019-01-05 NOTE — Interval H&P Note (Signed)
History and Physical Interval Note:  01/05/2019 1:44 PM  Rachel Valentine  has presented today for cardiac cath with the diagnosis of CAD. The various methods of treatment have been discussed with the patient and family. After consideration of risks, benefits and other options for treatment, the patient has consented to  Procedure(s): CORONARY ATHERECTOMY - STENT (N/A) as a surgical intervention.  The patient's history has been reviewed, patient examined, no change in status, stable for surgery.  I have reviewed the patient's chart and labs.  Questions were answered to the patient's satisfaction.    Cath Lab Visit (complete for each Cath Lab visit)  Clinical Evaluation Leading to the Procedure:   ACS: Yes.    Non-ACS:    Anginal Classification: CCS III  Anti-ischemic medical therapy: No Therapy  Non-Invasive Test Results: No non-invasive testing performed  Prior CABG: No previous CABG         Lauree Chandler

## 2019-01-05 NOTE — Progress Notes (Signed)
ANTICOAGULATION CONSULT NOTE   Pharmacy Consult for Heparin  Indication: 2 vessel CAD   Patient Measurements: Height: 5\' 2"  (157.5 cm) Weight: 121 lb 11.2 oz (55.2 kg) IBW/kg (Calculated) : 50.1 Heparin Dosing Weight: 54.2 kg  Vital Signs: Temp: 98.1 F (36.7 C) (05/05 0628) Temp Source: Oral (05/05 0628) BP: 137/64 (05/05 1017) Pulse Rate: 91 (05/05 1017)  Labs: Recent Labs    01/05/19 0255  HGB 11.9*  HCT 36.0  PLT 242  HEPARINUNFRC 0.42  CREATININE 0.76    Assessment: 78 y.o female, S/p cath today 01/04/19. Severe 2vCAD, successful DES PCI and PTCA of distal circumflex , severe residual RCA disease that is extremely calcified and will require atherectomy.   tentatively staged for orbital atherectomy based DES PCI of the RCA this afternoon  Plan ASA + Plavix for min 1 year Heparin level is therapeutic. H/h plts wnl.   Goal of Therapy:  Heparin level 0.3-0.7 units/ml Monitor platelets by anticoagulation protocol: Yes   Plan:  -Continue heparin at 650 units/hr -Daily HL, CBC -F/u heparin plans s/p PCI later today    Rachel Valentine  01/05/2019 11:19 AM

## 2019-01-05 NOTE — Progress Notes (Signed)
Progress Note  Patient Name: Rachel Valentine Date of Encounter: 01/05/2019  Primary Cardiologist: New to Dr. Gwenlyn Found  Subjective   Complains of some chest burning this morning.  Postop day 1 circumflex intervention by Dr. Ellyn Hack of the mid AV groove circumflex with residual distal disease.  Scheduled for calcified RCA orbital atherectomy by Dr. Angelena Form this morning.  Inpatient Medications    Scheduled Meds: . aspirin  81 mg Oral Daily  . atorvastatin  80 mg Oral q1800  . calcium citrate  200 mg of elemental calcium Oral Daily  . cholecalciferol  1,000 Units Oral Daily  . clopidogrel  75 mg Oral Q breakfast  . metoprolol tartrate  12.5 mg Oral BID  . pantoprazole  40 mg Oral Daily  . sodium chloride flush  3 mL Intravenous Q12H  . sodium chloride flush  3 mL Intravenous Q12H   Continuous Infusions: . sodium chloride    . sodium chloride    . sodium chloride Stopped (01/05/19 0409)  . sodium chloride 1 mL/kg/hr (01/05/19 0529)  . heparin 650 Units/hr (01/04/19 1922)   PRN Meds: sodium chloride, sodium chloride, acetaminophen, alum & mag hydroxide-simeth, morphine injection, nitroGLYCERIN, ondansetron (ZOFRAN) IV, sodium chloride flush, sodium chloride flush   Vital Signs    Vitals:   01/05/19 0033 01/05/19 0040 01/05/19 0044 01/05/19 0628  BP: 140/74  (!) 142/74 134/68  Pulse: 80  73 75  Resp:      Temp:   97.9 F (36.6 C) 98.1 F (36.7 C)  TempSrc:   Oral Oral  SpO2:  96%  98%  Weight:    55.2 kg  Height:        Intake/Output Summary (Last 24 hours) at 01/05/2019 0814 Last data filed at 01/05/2019 0700 Gross per 24 hour  Intake 824.38 ml  Output 1400 ml  Net -575.62 ml   Last 3 Weights 01/05/2019 01/04/2019 01/03/2019  Weight (lbs) 121 lb 11.2 oz 119 lb 6.4 oz 118 lb 2.7 oz  Weight (kg) 55.203 kg 54.159 kg 53.6 kg      Telemetry    Normal sinus rhythm- Personally Reviewed  ECG    Sinus rhythm at 78 without ST or T wave changes- Personally Reviewed   Physical Exam   GEN: No acute distress.   Neck: No JVD Cardiac: RRR, no murmurs, rubs, or gallops.  Respiratory: Clear to auscultation bilaterally. GI: Soft, nontender, non-distended  MS: No edema; No deformity. Neuro:  Nonfocal  Psych: Normal affect  Extremities: Ecchymosis right radial puncture site  Labs    Chemistry Recent Labs  Lab 01/01/19 1353 01/05/19 0255  NA 142 138  K 3.5 3.9  CL 101 108  CO2 28 22  GLUCOSE 121* 126*  BUN 19 16  CREATININE 0.78 0.76  CALCIUM 10.6* 8.7*  GFRNONAA >60 >60  GFRAA >60 >60  ANIONGAP 13 8     Hematology Recent Labs  Lab 01/01/19 1353 01/05/19 0255  WBC 10.1 9.4  RBC 4.76 3.95  HGB 14.4 11.9*  HCT 43.9 36.0  MCV 92.2 91.1  MCH 30.3 30.1  MCHC 32.8 33.1  RDW 13.1 12.8  PLT 321 242    Cardiac Enzymes Recent Labs  Lab 01/01/19 1721 01/02/19 0216 01/02/19 0438 01/02/19 0748  TROPONINI <0.03 <0.03 <0.03 <0.03   No results for input(s): TROPIPOC in the last 168 hours.   BNP Recent Labs  Lab 01/01/19 2058  BNP 28.5     DDimer No results for input(s): DDIMER in  the last 168 hours.   Radiology    No results found.  Cardiac Studies   Echo 01/03/19 1. The left ventricle has hyperdynamic systolic function, with an ejection fraction of >65%. The cavity size was normal. Left ventricular diastolic Doppler parameters are indeterminate. 2. The right ventricle has normal systolic function. The cavity was normal. There is no increase in right ventricular wall thickness. 3. The mitral valve is grossly normal. There is mild mitral annular calcification present. 4. The aortic valve is tricuspid. Mild thickening of the aortic valve. Mild calcification of the aortic valve. No stenosis of the aortic valve. 5. The aortic root is normal in size and structure. 6. The interatrial septum was not assessed.  Patient Profile     Rachel Mathies Culclasureis a 78 y.o.femalewith a hx of bowel obstructionpresented with  progressive chest pain with exertion for the past several months. COVID negative.   She was scheduled to see Dr. Gwenlyn Found for evaluation in [place] this was rescheduled due to [place]    1. Unstable angina/CAD - Troponin remained negative. Echo with normal LVEF.  - Cath showed Severe two-vessel CAD involving a focal 90% mid circumflex followed by 80% distal circumflex. -S/p Successful DES PCI of mid circumflex 90% lesion using resolute DES 2.5 mm x 12 mm (2.8 mm), and PTCA only of the distal circumflex reducing 80% stenosis to 30% using 1.5 mm balloon. - Plan for staged atherectomy for severe RCA disease today.  - Continue ASA, Plavix, statin, IV heparin and BB.   2. HLD - 01/02/2019: Cholesterol 178; HDL 79; LDL Cholesterol 85; Triglycerides 70; VLDL 14  - Changed low dose Crestor to Lipitor 80mg  daily.  - Repeat labs in 6 weeks.   3. Elevated BP - Improving on BB      For questions or updates, please contact [place] Please consult [email or web address] for contact info under        Signed, Leanor Kail, PA  01/05/2019, 8:14 AM    Agree with note by Robbie Lis PA-C  Postop day #1 circumflex intervention by Dr. Ellyn Hack with drug-eluting stent.  There was residual distal disease.  The LAD had only mild disease.  The RCA had diffuse disease and highly calcified vessel.  The LV function was normal by 2D echo.  Enzymes were negative.  EKG shows no acute changes.  The patient is on IV heparin.  Plan for diamondback orbital rotational atherectomy, PCI and drug-eluting stenting by Dr. Angelena Form later today.  [name], M.D., [place], [facility], [place] [address] [place], [place]  [address]  [phone] 01/05/2019 9:25 AM

## 2019-01-05 NOTE — Progress Notes (Signed)
ANTICOAGULATION CONSULT NOTE - Follow Up Consult  Pharmacy Consult for heparin Indication: CAD  Labs: Recent Labs    01/02/19 0438 01/02/19 0748 01/05/19 0255  HGB  --   --  11.9*  HCT  --   --  36.0  PLT  --   --  242  HEPARINUNFRC  --   --  0.42  CREATININE  --   --  0.76  TROPONINI <0.03 <0.03  --     Assessment/Plan:  78yo female therapeutic on heparin with initial dosing for CAD. Will continue gtt at current rate and confirm stable with additional level.   Wynona Neat, PharmD, BCPS  01/05/2019,3:49 AM

## 2019-01-05 NOTE — Progress Notes (Signed)
Cardiac Rehab Advisory Cardiac Rehab Phase I is not seeing pts face to face at this time due to Covid 19 restrictions. Ambulation is occurring through nursing, PT, and mobility teams. We will help facilitate that process as needed. We are calling pts in their rooms and discussing education. We will then deliver education materials to pts RN for delivery to pt.   [id number] Called pt on phone to complete education. Discussed importance of plavix with stent. Reviewed NTG use, heart healthy food choices, ex ed, CRP 2. Referred to [facility] program. Discussed with pt how to get back to walking as she has been walking about 3 miles a day. Pt to let RN know if any questions about ed. Will give written ex and diet info. Graylon Good RN BSN 01/05/2019 11:32 AM

## 2019-01-05 NOTE — Progress Notes (Addendum)
Site area: Right groin a 6 french venous sheath was removed  Site Prior to Removal:  Level 1- bruise  Pressure Applied For 35 MINUTES    Bedrest Beginning at 1740p  Manual:   Yes.    Patient Status During Pull:  stable  Post Pull Groin Site:  Level 1-bruise  Post Pull Instructions Given:  Yes.    Post Pull Pulses Present:  Yes.    Dressing Applied:  Yes.    Comments:

## 2019-01-06 ENCOUNTER — Telehealth: Payer: Self-pay | Admitting: Cardiology

## 2019-01-06 ENCOUNTER — Other Ambulatory Visit: Payer: Self-pay

## 2019-01-06 ENCOUNTER — Encounter (HOSPITAL_COMMUNITY): Payer: Self-pay | Admitting: Cardiovascular Disease

## 2019-01-06 DIAGNOSIS — Z955 Presence of coronary angioplasty implant and graft: Secondary | ICD-10-CM

## 2019-01-06 LAB — CBC
HCT: 32.6 % — ABNORMAL LOW (ref 36.0–46.0)
Hemoglobin: 10.7 g/dL — ABNORMAL LOW (ref 12.0–15.0)
MCH: 30.1 pg (ref 26.0–34.0)
MCHC: 32.8 g/dL (ref 30.0–36.0)
MCV: 91.6 fL (ref 80.0–100.0)
Platelets: 239 10*3/uL (ref 150–400)
RBC: 3.56 MIL/uL — ABNORMAL LOW (ref 3.87–5.11)
RDW: 12.8 % (ref 11.5–15.5)
WBC: 8.1 10*3/uL (ref 4.0–10.5)
nRBC: 0 % (ref 0.0–0.2)

## 2019-01-06 LAB — BASIC METABOLIC PANEL
Anion gap: 11 (ref 5–15)
BUN: 17 mg/dL (ref 8–23)
CO2: 23 mmol/L (ref 22–32)
Calcium: 8.7 mg/dL — ABNORMAL LOW (ref 8.9–10.3)
Chloride: 104 mmol/L (ref 98–111)
Creatinine, Ser: 0.81 mg/dL (ref 0.44–1.00)
GFR calc Af Amer: >60 mL/min (ref >60)
GFR calc non Af Amer: >60 mL/min (ref >60)
Glucose, Bld: 97 mg/dL (ref 70–99)
Potassium: 3.9 mmol/L (ref 3.5–5.1)
Sodium: 138 mmol/L (ref 135–145)

## 2019-01-06 MED ORDER — CLOPIDOGREL BISULFATE 75 MG PO TABS: 75.0000 mg | ORAL_TABLET | Freq: Every day | ORAL | 0 refills | Status: DC

## 2019-01-06 MED ORDER — NITROGLYCERIN 0.4 MG SL SUBL: 0.4000 mg | SUBLINGUAL_TABLET | SUBLINGUAL | 0 refills | Status: AC | PRN

## 2019-01-06 MED ORDER — ATORVASTATIN CALCIUM 80 MG PO TABS: 80.0000 mg | ORAL_TABLET | Freq: Every day | ORAL | 0 refills | Status: DC

## 2019-01-06 MED ORDER — ASPIRIN EC 81 MG PO TBEC: 81.0000 mg | DELAYED_RELEASE_TABLET | Freq: Every day | ORAL | 0 refills | Status: AC

## 2019-01-06 MED ORDER — METOPROLOL TARTRATE 25 MG PO TABS: 12.5000 mg | ORAL_TABLET | Freq: Two times a day (BID) | ORAL | 0 refills | Status: DC

## 2019-01-06 MED FILL — Fentanyl Citrate Preservative Free (PF) Inj 100 MCG/2ML: INTRAMUSCULAR | Qty: 2 | Status: AC

## 2019-01-06 NOTE — Progress Notes (Signed)
Progress Note  Patient Name: Rachel Valentine Date of Encounter: 01/06/2019  Primary Cardiologist: Dr. Quay Burow  Subjective   No chest pain or shortness of breath today.  Inpatient Medications    Scheduled Meds: . aspirin  81 mg Oral Daily  . atorvastatin  80 mg Oral q1800  . calcium citrate  200 mg of elemental calcium Oral Daily  . cholecalciferol  1,000 Units Oral Daily  . clopidogrel  75 mg Oral Q breakfast  . metoprolol tartrate  12.5 mg Oral BID  . pantoprazole  40 mg Oral Daily  . sodium chloride flush  3 mL Intravenous Q12H  . sodium chloride flush  3 mL Intravenous Q12H   Continuous Infusions: . sodium chloride    . sodium chloride Stopped (01/05/19 0409)  . sodium chloride     PRN Meds: sodium chloride, sodium chloride, acetaminophen, alum & mag hydroxide-simeth, morphine injection, nitroGLYCERIN, ondansetron (ZOFRAN) IV, sodium chloride flush, sodium chloride flush   Vital Signs    Vitals:   01/05/19 1730 01/05/19 1745 01/05/19 2136 01/06/19 0538  BP: (!) 143/52 (!) 127/43 (!) 110/50 (!) 98/51  Pulse: 91 82 88 77  Resp: 20 20    Temp:   98.7 F (37.1 C) 98.4 F (36.9 C)  TempSrc:   Oral Oral  SpO2: 100% 100% 98% 97%  Weight:    54.5 kg  Height:        Intake/Output Summary (Last 24 hours) at 01/06/2019 0808 Last data filed at 01/06/2019 0801 Gross per 24 hour  Intake 675.26 ml  Output 1000 ml  Net -324.74 ml   Last 3 Weights 01/06/2019 01/05/2019 01/04/2019  Weight (lbs) 120 lb 1.6 oz 121 lb 11.2 oz 119 lb 6.4 oz  Weight (kg) 54.477 kg 55.203 kg 54.159 kg      Telemetry    Sinus rhythm- Personally Reviewed  ECG    None today- Personally Reviewed  Physical Exam   GEN: No acute distress.   Neck: No JVD Cardiac: RRR, no murmurs, rubs, or gallops.  Respiratory: Clear to auscultation bilaterally. GI: Soft, nontender, non-distended  MS: No edema; No deformity. Neuro:  Nonfocal  Psych: Normal affect  Extremities: Right radial puncture  site appears well-healed  Labs    Chemistry Recent Labs  Lab 01/01/19 1353 01/05/19 0255 01/06/19 0355  NA 142 138 138  K 3.5 3.9 3.9  CL 101 108 104  CO2 28 22 23   GLUCOSE 121* 126* 97  BUN 19 16 17   CREATININE 0.78 0.76 0.81  CALCIUM 10.6* 8.7* 8.7*  GFRNONAA >60 >60 >60  GFRAA >60 >60 >60  ANIONGAP 13 8 11      Hematology Recent Labs  Lab 01/01/19 1353 01/05/19 0255 01/06/19 0355  WBC 10.1 9.4 8.1  RBC 4.76 3.95 3.56*  HGB 14.4 11.9* 10.7*  HCT 43.9 36.0 32.6*  MCV 92.2 91.1 91.6  MCH 30.3 30.1 30.1  MCHC 32.8 33.1 32.8  RDW 13.1 12.8 12.8  PLT 321 242 239    Cardiac Enzymes Recent Labs  Lab 01/01/19 1721 01/02/19 0216 01/02/19 0438 01/02/19 0748  TROPONINI <0.03 <0.03 <0.03 <0.03   No results for input(s): TROPIPOC in the last 168 hours.   BNP Recent Labs  Lab 01/01/19 2058  BNP 28.5     DDimer No results for input(s): DDIMER in the last 168 hours.   Radiology    No results found.  Cardiac Studies   TTE: 01/02/19  IMPRESSIONS    1. The  left ventricle has hyperdynamic systolic function, with an ejection fraction of >65%. The cavity size was normal. Left ventricular diastolic Doppler parameters are indeterminate.  2. The right ventricle has normal systolic function. The cavity was normal. There is no increase in right ventricular wall thickness.  3. The mitral valve is grossly normal. There is mild mitral annular calcification present.  4. The aortic valve is tricuspid. Mild thickening of the aortic valve. Mild calcification of the aortic valve. No stenosis of the aortic valve.  5. The aortic root is normal in size and structure.  6. The interatrial septum was not assessed.  Cath: 01/04/19   Lesion #1: Prox Cx to Mid Cx lesion is 95% stenosed.  A drug-eluting stent was successfully placed using a STENT RESOLUTE ONYX 2.5X12. -- Postdilated to 2.8 mm. Post intervention, there is a 5% residual stenosis.  Lesion #2: Mid Cx to Dist Cx  lesion is 80% stenosed. (1.5-2.0 mm vessel)  Balloon angioplasty was performed using a BALLOON SAPPHIRE 1.5X15. Post intervention, there is a 30% residual stenosis.  -------------------------------------------------------  Prox RCA lesion is 99% stenosed. Mid RCA-1 lesion is 65% stenosed. Mid RCA-2 lesion is 80% stenosed. Mid RCA to Dist RCA lesion is 95% stenosed.  -------------------------------------------------------  The left ventricular systolic function is normal. The left ventricular ejection fraction is 55-65% by visual estimate. LV end diastolic pressure is normal.   SUMMARY  Severe two-vessel CAD involving a focal 90% mid circumflex followed by 80% distal circumflex as well as diffuse calcified 99%, 80% and 90% stenoses in the proximal, mid, distal RCA.  Successful DES PCI of mid circumflex 90% lesion using resolute DES 2.5 mm x 12 mm (2.8 mm), and PTCA only of the distal circumflex reducing 80% stenosis to 30% using 1.5 mm balloon.  Preserved LVEF with normal LVEDP.  Systemic hypertension  RECOMMENDATIONS  The patient has severe residual RCA disease that is extremely calcified and will require atherectomy.  She will be tentatively staged for orbital atherectomy based DES PCI of the RCA with Dr. Angelena Form tomorrow afternoon following hydration.  Continue aggressive blood pressure management, have increased Lipitor to 80 mg daily.  Aspirin plus Plavix for minimum of 1 year.  Continue other risk factor modification.  Glenetta Hew, M.D., M.S.  Cath: 01/05/19   Prox RCA lesion is 50% stenosed.  Prox RCA to Mid RCA lesion is 99% stenosed.  Mid RCA to Dist RCA lesion is 90% stenosed.  A drug-eluting stent was successfully placed using a STENT SYNERGY DES 2.5X38.  Post intervention, there is a 0% residual stenosis.  A drug-eluting stent was successfully placed using a STENT SYNERGY DES 2.75X16.  Post intervention, there is a 0% residual stenosis.   1. Successful  orbital atherectomy/PTCA/DES x 2 mid and distal RCA  Continue DAPT with ASA/Plavix for one year. Continue statin and beta blocker.   Patient Profile     78 y.o. female with PMH of bowel obstruction who presented with progressive chest pain. COVID negative. Underwent cardiac cath noted above.   Assessment & Plan    1. Unstable angina/CAD: underwent cardiac cath on 5/4 with severe 2v disease with successful PCI/DESx1 to the mLCx and PTCA on the dLCx reducing stenosis from 80-30%. Placed on DAPT with ASA/plavix post cath. Underwent staged intervention to the RCA with orbital atherectomy/PTCA/DES x2 to the m/dRCA. Echo with normal EF and no reported rWMA.   2. HL: switched to high dose statin. LDL 85  3. HTN: tolerating low dose BB.  [facility] will sign off.   Medication Recommendations:  Noted above Other recommendations (labs, testing, etc):  none Follow up as an outpatient:  Will arrange for TOC follow up appt.   For questions or updates, please contact [place] Please consult [email or web address] for contact info under        Signed, Reino Bellis, NP  01/06/2019, 8:08 AM     Agree with note by Reino Bellis NP-C  Ms. Fay is stable for discharge today.  She is had staged circumflex and RCA intervention.  She has normal LV function.  Her exam is benign.  She denies chest pain or shortness of breath.  She has been up walking.  She can be discharged home on DAPT with aspirin and Plavix for 1 year uninterrupted twice high-dose statin therapy.  Return office visit with me in 3 to 4 weeks.  TOC 7.  [name], M.D., [place], [facility], [place] [address] [place], [place]  [address]  [phone] 01/06/2019 11:04 AM

## 2019-01-06 NOTE — Discharge Instructions (Signed)
Coronary Angiogram With Stent, Care After  This sheet gives you information about how to care for yourself after your procedure. Your health care provider may also give you more specific instructions. If you have problems or questions, contact your health care provider.  What can I expect after the procedure?  After your procedure, it is common to have:   Bruising in the area where a small, thin tube (catheter) was inserted. This usually fades within 1-2 weeks.   Blood collecting in the tissue (hematoma) that may be painful to the touch. It should usually decrease in size and tenderness within 1-2 weeks.  Follow these instructions at home:  Insertion area care   Do not take baths, swim, or use a hot tub until your health care provider approves.   You may shower 24-48 hours after the procedure or as directed by your health care provider.   Follow instructions from your health care provider about how to take care of your incision. Make sure you:  ? Wash your hands with soap and water before you change your bandage (dressing). If soap and water are not available, use hand sanitizer.  ? Change your dressing as told by your health care provider.  ? Leave stitches (sutures), skin glue, or adhesive strips in place. These skin closures may need to stay in place for 2 weeks or longer. If adhesive strip edges start to loosen and curl up, you may trim the loose edges. Do not remove adhesive strips completely unless your health care provider tells you to do that.   Remove the bandage (dressing) and gently wash the catheter insertion site with plain soap and water.   Pat the area dry with a clean towel. Do not rub the area, because that may cause bleeding.   Do not apply powder or lotion to the incision area.   Check your incision area every day for signs of infection. Check for:  ? More redness, swelling, or pain.  ? More fluid or blood.  ? Warmth.  ? Pus or a bad smell.  Activity   Do not drive for 24 hours if you  were given a medicine to help you relax (sedative).   Do not lift anything that is heavier than 10 lb (4.5 kg) for 5 days after your procedure or as directed by your health care provider.   Ask your health care provider when it is okay for you:  ? To return to work or school.  ? To resume usual physical activities or sports.  ? To resume sexual activity.  Eating and drinking     Eat a heart-healthy diet. This should include plenty of fresh fruits and vegetables.   Avoid the following types of food:  ? Food that is high in salt.  ? Canned or highly processed food.  ? Food that is high in saturated fat or sugar.  ? Fried food.   Limit alcohol intake to no more than 1 drink a day for non-pregnant women and 2 drinks a day for men. One drink equals 12 oz of beer, 5 oz of wine, or 1 oz of hard liquor.  Lifestyle     Do not use any products that contain nicotine or tobacco, such as cigarettes and e-cigarettes. If you need help quitting, ask your health care provider.   Take steps to manage and control your weight.   Get regular exercise.   Manage your blood pressure.   Manage other health problems, such as diabetes.    General instructions   Take over-the-counter and prescription medicines only as told by your health care provider. Blood thinners may be prescribed after your procedure to improve blood flow through the stent.   If you need an MRI after your heart stent has been placed, be sure to tell the health care provider who orders the MRI that you have a heart stent.   Keep all follow-up visits as directed by your health care provider. This is important.  Contact a health care provider if:   You have a fever.   You have chills.   You have increased bleeding from the catheter insertion area. Hold pressure on the area.  Get help right away if:   You develop chest pain or shortness of breath.   You feel faint or you pass out.   You have unusual pain at the catheter insertion area.   You have redness,  warmth, or swelling at the catheter insertion area.   You have drainage (other than a small amount of blood on the dressing) from the catheter insertion area.   The catheter insertion area is bleeding, and the bleeding does not stop after 30 minutes of holding steady pressure on the area.   You develop bleeding from any other place, such as from your rectum. There may be bright red blood in your urine or stool, or it may appear as black, tarry stool.  This information is not intended to replace advice given to you by your health care provider. Make sure you discuss any questions you have with your health care provider.  Document Released: 03/08/2005 Document Revised: 05/16/2016 Document Reviewed: 05/16/2016  Elsevier Interactive Patient Education  2019 [employer]

## 2019-01-06 NOTE — Progress Notes (Signed)
TR BAND REMOVAL  LOCATION:    right radial  DEFLATED PER PROTOCOL:    Yes.    TIME BAND OFF / DRESSING APPLIED:    2215   SITE UPON ARRIVAL:    Level 1 ( bruised)   SITE AFTER BAND REMOVAL:    Level 1 ( bruised)  CIRCULATION SENSATION AND MOVEMENT:    Within Normal Limits   Yes.    COMMENTS:   Post TR band instructions given, applied sterile dressing, good capillary refill.

## 2019-01-06 NOTE — Telephone Encounter (Signed)
   TELEPHONE CALL NOTE  This patient has been deemed a candidate for follow-up tele-health visit to limit community exposure during the Covid-19 pandemic. I spoke with the patient via phone to discuss instructions. This has been outlined on the patient's AVS (dotphrase: [email or web address]). The patient was advised to review the section on consent for treatment as well. The patient will receive a phone call 2-3 days prior to their E-Visit at which time consent will be verbally confirmed. A Virtual Office Visit appointment type has been scheduled for 01/19/19 with Dr. Gwenlyn Found, with "VIDEO" or "TELEPHONE" in the appointment notes - patient prefers VIDEO type.  I have either confirmed the patient is active in MyChart or offered to send sign-up link to phone/email via Mychart icon beside patient's photo.  Reino Bellis, NP 01/06/2019 10:58 AM

## 2019-01-08 NOTE — Discharge Summary (Signed)
[facility] Discharge Summary   [name] ZOX:[id number]   PCP: [facility] DOB: 15-Dec-1940   Date of admission: 01/01/2019   Date of discharge: 01/06/2019     Discharge Diagnoses:  Principal diagnosis Unstable angina Coronary artery disease Active Problems:   Chest pain   Elevated BP without diagnosis of hypertension   Unstable angina ([place]) Dyslipidemia Anemia unspecified  Admitted From: Home Disposition: Home  Recommendations for Outpatient Follow-up:  1. Please follow-up with cardiology as well as PCP in 1 week. 2. Patient will need a repeat CBC in 1 week to ensure stabilization.  Follow-up Information    [name], MD Follow up on 01/19/2019.   Specialties:  Cardiology, Radiology Why:  at 11:30am for your follow up appt. This will be scheduled as a tele-health visit through your mobile phone.  Contact information: [address] [place] [address] [phone]        Angelina Pih, MD. Schedule an appointment as soon as possible for a visit in 1 week(s).   Specialty:  Family Medicine Why:  need repeat CBC Contact information: [place] [address] [phone]          Diet recommendation: Cardiac diet  Activity: The patient is advised to gradually reintroduce usual activities.  Discharge Condition: good  Code Status: Full code  History of present illness: As per the H and P dictated on admission, "Rachel Valentine is a 78 y.o. female with medical history significant of bowel obstruction,     Presented with Chest pain worse with just minimal exertion. Radiating to her neck and jaw, She has been having worsening chest pain with exertion For the past 2 weeks could not even walk to her mail box. Occasional chest pain occasionally at rest. If she is resting it will subside in a few minutes. Recently even walking around the house.  He mother died of CHF  brother and father had a massive stroke. Has extensive hx of heart disease in the family  She started to feel bad initially in November and have been getting progressively worse.  She was recently scheduled to see Dr. Gwenlyn Found in office as a new pt on 4/21 appointment has been canceled due to Covid-19 Never had a stress test or cardiac cath never been told she has asthma or COPD. She used to smoke 20 years ago but not anymore.  She has been isolating her self and she wares mask when she goes out. [facility] Course:  Summary of her active problems in the hospital is as following. Unstable angina with severe two vessel coronary artery disease: Status post cardiac cath with PCI and drug-eluting stent placed on mid circumflex and distal circumflex. Severe residual RCA disease, underwent atherectomy  as a staged procedure.   Treated with heparin infusion. On aspirin, Plavix. High intensity statin. Aggressive blood pressure management.  Cardiac rehab referral made.  Hyperlipidemia:  At home on Crestor.  Changing to high intensity statin with Lipitor 80 mg daily.   Hypertension: Previously untreated.   Controlled with metoprolol.  Unspecified anemia. Patient does not have any blood loss here in the hospital. Was treated with heparin was given IV fluids. No melena reported here in the hospital. Hemoglobin at the time of admission 13.7, currently 10.7. Suspecting this is dilutional in nature. Although recommending repeat CBC in 1 week with PCP to ensure this  Patient was ambulatory without any assistance. On the day of  the discharge the patient's vitals were stable , and no other acute medical condition were reported by patient. the patient was felt safe to be discharge at home with family.  Consultants: cardiology  Procedures: Cardiac Catheterization   DISCHARGE MEDICATION: Allergies as of 01/06/2019      Reactions   Prednisone Other (See Comments)   Blood pressure high when she  takes it   Penicillins Rash      Medication List    STOP taking these medications   aspirin 81 MG chewable tablet Replaced by:  aspirin EC 81 MG tablet   ibuprofen 200 MG tablet Commonly known as:  ADVIL   rosuvastatin 5 MG tablet Commonly known as:  CRESTOR     TAKE these medications   aspirin EC 81 MG tablet Take 1 tablet (81 mg total) by mouth daily. Replaces:  aspirin 81 MG chewable tablet   atorvastatin 80 MG tablet Commonly known as:  LIPITOR Take 1 tablet (80 mg total) by mouth daily at 6 PM.   Azelastine HCl 0.15 % Soln Place 2 sprays into the nose daily.   calcium citrate 950 MG tablet Commonly known as:  CALCITRATE - dosed in mg elemental calcium Take 200 mg of elemental calcium by mouth daily.   cholecalciferol 1000 units tablet Commonly known as:  VITAMIN D Take 1,000 Units by mouth daily.   clopidogrel 75 MG tablet Commonly known as:  PLAVIX Take 1 tablet (75 mg total) by mouth daily with breakfast.   levocetirizine 5 MG tablet Commonly known as:  XYZAL Take 5 mg by mouth every evening.   metoprolol tartrate 25 MG tablet Commonly known as:  LOPRESSOR Take 0.5 tablets (12.5 mg total) by mouth 2 (two) times daily.   nitroGLYCERIN 0.4 MG SL tablet Commonly known as:  NITROSTAT Place 1 tablet (0.4 mg total) under the tongue every 5 (five) minutes as needed for chest pain.   omeprazole 40 MG capsule Commonly known as:  PRILOSEC Take 40 mg by mouth daily.      Allergies  Allergen Reactions  . Prednisone Other (See Comments)    Blood pressure high when she takes it  . Penicillins Rash   Discharge Instructions    Amb Referral to Cardiac Rehabilitation   Complete by:  As directed    Diagnosis:   Coronary Stents PTCA     Diet - low sodium heart healthy   Complete by:  As directed    Discharge instructions   Complete by:  As directed    It is important that you read the given instructions as well as go over your medication list with RN to  help you understand your care after this hospitalization.  Discharge Instructions: Please follow-up with PCP in 1-2 weeks  Please request your primary care physician to go over all Hospital Tests and Procedure/Radiological results at the follow up. Please get all Hospital records sent to your PCP by signing hospital release before you go home.   Do not take more than prescribed Pain, Sleep and Anxiety Medications. You were cared for by a hospitalist during your hospital stay. If you have any questions about your discharge medications or the care you received while you were in the hospital after you are discharged, you can call the unit @UNIT @ you were admitted to and ask to speak with the hospitalist on call if the hospitalist that took care of you is not available.  Once you are discharged, your primary care physician will handle any  further medical issues. Please note that NO REFILLS for any discharge medications will be authorized once you are discharged, as it is imperative that you return to your primary care physician (or establish a relationship with a primary care physician if you do not have one) for your aftercare needs so that they can reassess your need for medications and monitor your lab values. You Must read complete instructions/literature along with all the possible adverse reactions/side effects for all the Medicines you take and that have been prescribed to you. Take any new Medicines after you have completely understood and accept all the possible adverse reactions/side effects. Wear Seat belts while driving. If you have smoked or chewed Tobacco in the last 2 yrs please stop smoking and/or stop any Recreational drug use.  If you drink alcohol, please moderate the use and do not drive, operating heavy machinery, perform activities at heights, swimming or participation in water activities or provide baby sitting services under influence.   Increase activity slowly   Complete by:  As  directed      Discharge Exam: Filed Weights   01/04/19 0532 01/05/19 0628 01/06/19 0538  Weight: 54.2 kg 55.2 kg 54.5 kg   Vitals:   01/06/19 0538 01/06/19 1020  BP: (!) 98/51 (!) 113/50  Pulse: 77 85  Resp:    Temp: 98.4 F (36.9 C)   SpO2: 97%    General: Appear in no distress, no Rash; Oral Mucosa moist. Cardiovascular: S1 and S2 Present, no Murmur, no JVD Respiratory: Bilateral Air entry present and Clear to Auscultation, no Crackles, no wheezes Abdomen: Bowel Sound present, Soft and no tenderness Extremities: no Pedal edema, no calf tenderness Neurology: Grossly no focal neuro deficit.  The results of significant diagnostics from this hospitalization (including imaging, microbiology, ancillary and laboratory) are listed below for reference.    Significant Diagnostic Studies: Dg Chest 2 View  Result Date: 01/01/2019 CLINICAL DATA:  Chest pain EXAM: CHEST - 2 VIEW COMPARISON:  None. FINDINGS: The lungs are hyperinflated with diffuse interstitial prominence. No focal airspace consolidation or pulmonary edema. No pleural effusion or pneumothorax. Normal cardiomediastinal contours. IMPRESSION: Hyperinflation may indicate COPD.  No acute airspace disease. Electronically Signed   By: Ulyses Jarred M.D.   On: 01/01/2019 14:18    Microbiology: Recent Results (from the past 240 hour(s))  SARS Coronavirus 2 (CEPHEID - Performed in [facility] hospital lab), Hosp Order     Status: None   Collection Time: 01/01/19  9:54 PM  Result Value Ref Range Status   SARS Coronavirus 2 NEGATIVE NEGATIVE Final    Comment: (NOTE) If result is NEGATIVE SARS-CoV-2 target nucleic acids are NOT DETECTED. The SARS-CoV-2 RNA is generally detectable in upper and lower  respiratory specimens during the acute phase of infection. The lowest  concentration of SARS-CoV-2 viral copies this assay can detect is 250  copies / mL. A negative result does not preclude SARS-CoV-2 infection  and should not be used  as the sole basis for treatment or other  patient management decisions.  A negative result may occur with  improper specimen collection / handling, submission of specimen other  than nasopharyngeal swab, presence of viral mutation(s) within the  areas targeted by this assay, and inadequate number of viral copies  (<250 copies / mL). A negative result must be combined with clinical  observations, patient history, and epidemiological information. If result is POSITIVE SARS-CoV-2 target nucleic acids are DETECTED. The SARS-CoV-2 RNA is generally detectable in upper and lower  respiratory specimens dur ing the acute phase of infection.  Positive  results are indicative of active infection with SARS-CoV-2.  Clinical  correlation with patient history and other diagnostic information is  necessary to determine patient infection status.  Positive results do  not rule out bacterial infection or co-infection with other viruses. If result is PRESUMPTIVE POSTIVE SARS-CoV-2 nucleic acids MAY BE PRESENT.   A presumptive positive result was obtained on the submitted specimen  and confirmed on repeat testing.  While 2019 novel coronavirus  (SARS-CoV-2) nucleic acids may be present in the submitted sample  additional confirmatory testing may be necessary for epidemiological  and / or clinical management purposes  to differentiate between  SARS-CoV-2 and other Sarbecovirus currently known to infect humans.  If clinically indicated additional testing with an alternate test  methodology [phone]) is advised. The SARS-CoV-2 RNA is generally  detectable in upper and lower respiratory sp ecimens during the acute  phase of infection. The expected result is Negative. Fact Sheet for Patients:  StrictlyIdeas.no Fact Sheet for Healthcare Providers: BankingDealers.co.za This test is not yet approved or cleared by the [place] FDA and has been authorized for  detection and/or diagnosis of SARS-CoV-2 by FDA under an Emergency Use Authorization (EUA).  This EUA will remain in effect (meaning this test can be used) for the duration of the COVID-19 declaration under Section 564(b)(1) of the Act, 21 U.S.C. section 360bbb-3(b)(1), unless the authorization is terminated or revoked sooner. Performed at [facility], [place] [address], [place], [place] [address]      Labs: CBC: Recent Labs  Lab 01/05/19 0255 01/06/19 0355  WBC 9.4 8.1  HGB 11.9* 10.7*  HCT 36.0 32.6*  MCV 91.1 91.6  PLT 242 438   Basic Metabolic Panel: Recent Labs  Lab 01/01/19 2052 01/05/19 0255 01/06/19 0355  NA  --  138 138  K  --  3.9 3.9  CL  --  108 104  CO2  --  22 23  GLUCOSE  --  126* 97  BUN  --  16 17  CREATININE  --  0.76 0.81  CALCIUM  --  8.7* 8.7*  MG 2.3  --   --   PHOS 3.5  --   --    Liver Function Tests: No results for input(s): AST, ALT, ALKPHOS, BILITOT, PROT, ALBUMIN in the last 168 hours. No results for input(s): LIPASE, AMYLASE in the last 168 hours. No results for input(s): AMMONIA in the last 168 hours. Cardiac Enzymes: Recent Labs  Lab 01/02/19 0216 01/02/19 0438 01/02/19 0748  TROPONINI <0.03 <0.03 <0.03   BNP (last 3 results) Recent Labs    01/01/19 2058  BNP 28.5   CBG: No results for input(s): GLUCAP in the last 168 hours. Time spent: 35 minutes  Signed:  Berle Mull  [facility] 01/06/2019

## 2019-01-14 DIAGNOSIS — Z79899 Other long term (current) drug therapy: Secondary | ICD-10-CM | POA: Diagnosis not present

## 2019-01-14 DIAGNOSIS — I2510 Atherosclerotic heart disease of native coronary artery without angina pectoris: Secondary | ICD-10-CM | POA: Diagnosis not present

## 2019-01-14 DIAGNOSIS — E7800 Pure hypercholesterolemia, unspecified: Secondary | ICD-10-CM | POA: Diagnosis not present

## 2019-01-14 DIAGNOSIS — I10 Essential (primary) hypertension: Secondary | ICD-10-CM | POA: Diagnosis not present

## 2019-01-14 DIAGNOSIS — D649 Anemia, unspecified: Secondary | ICD-10-CM | POA: Diagnosis not present

## 2019-01-18 ENCOUNTER — Telehealth: Payer: Self-pay | Admitting: Cardiovascular Disease

## 2019-01-19 ENCOUNTER — Telehealth: Payer: Self-pay

## 2019-01-19 ENCOUNTER — Telehealth (INDEPENDENT_AMBULATORY_CARE_PROVIDER_SITE_OTHER): Payer: Medicare HMO | Admitting: Cardiovascular Disease

## 2019-01-19 VITALS — BP 135/67 | HR 81 | Ht 62.0 in | Wt 116.0 lb

## 2019-01-19 DIAGNOSIS — I2510 Atherosclerotic heart disease of native coronary artery without angina pectoris: Secondary | ICD-10-CM | POA: Insufficient documentation

## 2019-01-19 DIAGNOSIS — E782 Mixed hyperlipidemia: Secondary | ICD-10-CM | POA: Diagnosis not present

## 2019-01-19 DIAGNOSIS — E785 Hyperlipidemia, unspecified: Secondary | ICD-10-CM

## 2019-01-19 DIAGNOSIS — I200 Unstable angina: Secondary | ICD-10-CM

## 2019-01-19 NOTE — Telephone Encounter (Signed)
Patient and/or DPR-approved person aware of AVS instructions and verbalized understanding. AVS to [employer]

## 2019-01-19 NOTE — Progress Notes (Signed)
Virtual Visit via Video Note   This visit type was conducted due to national recommendations for restrictions regarding the COVID-19 Pandemic (e.g. social distancing) in an effort to limit this patient's exposure and mitigate transmission in our community.  Due to her co-morbid illnesses, this patient is at least at moderate risk for complications without adequate follow up.  This format is felt to be most appropriate for this patient at this time.  All issues noted in this document were discussed and addressed.  A limited physical exam was performed with this format.  Please refer to the patient's chart for her consent to telehealth for [facility].   Date:  01/19/2019   ID:  Crowder, DOB 05/07/1941, MRN [id number]  Patient Location: Home Provider Location: Home  PCP:  [facility]  Cardiologist:  Quay Burow, MD  Electrophysiologist:  None   Evaluation Performed:  Follow-Up Visit  Chief Complaint: Follow-up CAD  History of Present Illness:    Rachel Valentine is a delightful 78 year old widowed Caucasian female mother of 2, grandmother and one grandchild referred to me initially by Dr. Hosie Poisson, her prior PCP.  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  She was having palpitations and chest pain.  Her risk factors include hyperlipidemia and family history with a mother who had CABG.  She is never had a heart attack or stroke.  She was admitted to [facility] on 01/01/2023 unstable angina.  She ruled out for myocardial infarction.  She underwent cardiac catheterization by Dr. Ellyn Hack 01/04/2019 revealing normal LV function, high-grade mid AV groove circumflex on a band and high-grade diffuse calcified RCA stenosis with a fairly normal LAD.  She underwent LAD intervention by Dr. Ellyn Hack with staged diamondback orbital rotational atherectomy, PCI and drug-eluting stenting of the proximal  mid and distal RCA by Dr. Angelena Form on 01/06/2019.  She was discharged home the following day.  She is felt well and denies chest pain.  She is on aspirin and Plavix as well as high-dose atorvastatin and a beta-blocker.  She is a fairly active exerciser when he was walking an hour a day 4 to 5 days a week and now she is back walking 20 to 30 minutes a day and wishes to go back to her prior exercise routine as well as weight lifting which I have agreed to.  The patient does not have symptoms concerning for COVID-19 infection (fever, chills, cough, or new shortness of breath).    Past Medical History:  Diagnosis Date   Bowel obstruction [facility])    Past Surgical History:  Procedure Laterality Date   ABDOMINAL HYSTERECTOMY     ABDOMINAL SURGERY     APPENDECTOMY     AUGMENTATION MAMMAPLASTY Bilateral    implants removed in 2005   BREAST EXCISIONAL BIOPSY Left    benign   CORONARY ATHERECTOMY N/A 01/05/2019   Procedure: CORONARY ATHERECTOMY - STENT;  Surgeon: Burnell Blanks, MD;  Location: [place] CV LAB;  Service: Cardiovascular;  Laterality: N/A;   CORONARY STENT INTERVENTION N/A 01/04/2019   Procedure: CORONARY STENT INTERVENTION;  Surgeon: Leonie Man, MD;  Location: [place] CV LAB;  Service: Cardiovascular;  Laterality: N/A;   LEFT HEART CATH AND CORONARY ANGIOGRAPHY N/A 01/04/2019   Procedure: LEFT HEART CATH AND CORONARY ANGIOGRAPHY;  Surgeon: Leonie Man, MD;  Location: [place] [facility] LAB;  Service: Cardiovascular;  Laterality: N/A;   TEMPORARY  PACEMAKER N/A 01/05/2019   Procedure: TEMPORARY PACEMAKER;  Surgeon: Burnell Blanks, MD;  Location: [place] [facility];  Service: Cardiovascular;  Laterality: N/A;     Current Meds  Medication Sig   aspirin EC 81 MG tablet Take 1 tablet (81 mg total) by mouth daily.   atorvastatin (LIPITOR) 80 MG tablet Take 1 tablet (80 mg total) by mouth daily at 6 PM.   Azelastine HCl 0.15 % SOLN Place 2 sprays into the  nose daily.   calcium citrate (CALCITRATE - DOSED IN MG ELEMENTAL CALCIUM) 950 MG tablet Take 200 mg of elemental calcium by mouth daily.   cholecalciferol (VITAMIN D) 1000 units tablet Take 1,000 Units by mouth daily.   clopidogrel (PLAVIX) 75 MG tablet Take 1 tablet (75 mg total) by mouth daily with breakfast.   levocetirizine (XYZAL) 5 MG tablet Take 5 mg by mouth every evening.   metoprolol tartrate (LOPRESSOR) 25 MG tablet Take 0.5 tablets (12.5 mg total) by mouth 2 (two) times daily.   nitroGLYCERIN (NITROSTAT) 0.4 MG SL tablet Place 1 tablet (0.4 mg total) under the tongue every 5 (five) minutes as needed for chest pain.   omeprazole (PRILOSEC) 40 MG capsule Take 40 mg by mouth daily.     Allergies:   Prednisone and Penicillins   Social History   Tobacco Use   Smoking status: Former Smoker    Last attempt to quit: 01/06/1999    Years since quitting: 20.0   Smokeless tobacco: Never Used  Substance Use Topics   Alcohol use: No   Drug use: Not on file     Family Hx: The patient's family history includes Heart failure in her mother; Stroke in her brother and father.  ROS:   Please see the history of present illness.     All other systems reviewed and are negative.   Prior CV studies:   The following studies were reviewed today:  None  Labs/Other Tests and Data Reviewed:    EKG:  No ECG reviewed.  Recent Labs: 01/01/2019: B Natriuretic Peptide 28.5; Magnesium 2.3; TSH 3.976 01/06/2019: BUN 17; Creatinine, Ser 0.81; Hemoglobin 10.7; Platelets 239; Potassium 3.9; Sodium 138   Recent Lipid Panel Lab Results  Component Value Date/Time   CHOL 178 01/02/2019 02:16 AM   TRIG 70 01/02/2019 02:16 AM   HDL 79 01/02/2019 02:16 AM   CHOLHDL 2.3 01/02/2019 02:16 AM   LDLCALC 85 01/02/2019 02:16 AM    Wt Readings from Last 3 Encounters:  01/19/19 116 lb (52.6 kg)  01/06/19 120 lb 1.6 oz (54.5 kg)  12/05/16 115 lb (52.2 kg)     Objective:    Vital Signs:  BP  135/67    Pulse 81    Ht 5\' 2"  (1.575 m)    Wt 116 lb (52.6 kg)    BMI 21.22 kg/m    VITAL SIGNS:  reviewed GEN:  no acute distress RESPIRATORY:  normal respiratory effort, symmetric expansion NEURO:  alert and oriented x 3, no obvious focal deficit PSYCH:  normal affect  ASSESSMENT & PLAN:    1. Coronary artery disease- history of CAD status post recent multivessel intervention of her circumflex by Dr. Ellyn Hack on 01/04/2019 and of her calcified RCA by Dr. Angelena Form 2 days later requiring orbital atherectomy and multiple stents.  She is done well since and is currently back to her exercise routine without limitation on aspirin and Plavix. 2. Hyperlipidemia- history of hyperlipidemia with lipid profile performed 01/02/2019 revealing total cholesterol  178, LDL of 85 and HDL of 79 on Crestor in the past and currently on high-dose atorvastatin.  We will recheck a lipid liver profile in 2 months  COVID-19 Education: The signs and symptoms of COVID-19 were discussed with the patient and how to seek care for testing (follow up with PCP or arrange E-visit).  The importance of social distancing was discussed today.  Time:   Today, I have spent 12 minutes with the patient with telehealth technology discussing the above problems.     Medication Adjustments/Labs and Tests Ordered: Current medicines are reviewed at length with the patient today.  Concerns regarding medicines are outlined above.   Tests Ordered: No orders of the defined types were placed in this encounter.   Medication Changes: No orders of the defined types were placed in this encounter.   Disposition:  Follow up in 3 month(s)  Signed, Quay Burow, MD  01/19/2019 11:31 AM    [place]

## 2019-01-19 NOTE — Patient Instructions (Signed)
Medication Instructions:  Your physician recommends that you continue on your current medications as directed. Please refer to the Current Medication list given to you today.  If you need a refill on your cardiac medications before your next appointment, please call your pharmacy.   Lab work: Your physician recommends that you return for lab work in [place] AND LIVER FUNCTION TEST. YOU WILL RECEIVE A LAB SLIP IN THE MAIL. PLEASE DO NOT EAT OR DRINK (EXCEPT WATER) ANYTHING AFTER MIDNIGHT ON THE DAY YOU CHOOSE TO PRESENT FOR LAB WORK. YOU MAY EAT AFTER YOUR BLOOD HAS BEEN COLLECTED. NO APPOINTMENT IS NEEDED.  If you have labs (blood work) drawn today and your tests are completely normal, you will receive your results only by: Marland Kitchen MyChart Message (if you have MyChart) OR . A paper copy in the mail If you have any lab test that is abnormal or we need to change your treatment, we will call you to review the results.  Testing/Procedures: NONE  Follow-Up: At [facility], you and your health needs are our priority.  As part of our continuing mission to provide you with exceptional heart care, we have created designated Provider Care Teams.  These Care Teams include your primary Cardiologist (physician) and Advanced Practice Providers (APPs -  Physician Assistants and Nurse Practitioners) who all work together to provide you with the care you need, when you need it. . You will need a follow up appointment in 3 months with an APP and in 6 months with Dr. Gwenlyn Found.  Please call our office 2 months in advance to schedule each appointment.  You may see one of the following Advanced Practice Providers on your designated Care Team:   . Kerin Ransom, [place] . Almyra Deforest, PA-C . Fabian Sharp, PA-C . Jory Sims, DNP . Rosaria Ferries, PA-C . Roby Lofts, PA-C . Sande Rives, PA-C

## 2019-01-21 ENCOUNTER — Telehealth (HOSPITAL_COMMUNITY): Payer: Self-pay | Admitting: *Deleted

## 2019-01-21 DIAGNOSIS — E7800 Pure hypercholesterolemia, unspecified: Secondary | ICD-10-CM | POA: Diagnosis not present

## 2019-01-21 DIAGNOSIS — D649 Anemia, unspecified: Secondary | ICD-10-CM | POA: Diagnosis not present

## 2019-01-21 DIAGNOSIS — Z79899 Other long term (current) drug therapy: Secondary | ICD-10-CM | POA: Diagnosis not present

## 2019-01-21 NOTE — Telephone Encounter (Signed)
Called and spoke to pt.  Pt is doing well.  Walking 30 minutes twice a day with  No complaint. Prior to her cardiac event, pt was walking 3 miles a day at [employer]. Spoke with pt regarding virtual cardiac rehab as a logging tool for exercise and vital signs with periodic check in with rehab staff and education videos. Pt unsure and wanted to think about it because of her prior exercise history.  Pt does not do warm up or cool down stretches with her walks. Will send her a handout on stretches along with using handweights in addition to walking.  Pt requested information on nutrition.  Will send this information as well. Pt has smartphone, internet and ease of technology.Will follow up with pt to see what she has decided regarding virtual cardiac rehab. Cherre Huger, BSN Cardiac and Training and development officer

## 2019-01-29 ENCOUNTER — Other Ambulatory Visit: Payer: Self-pay

## 2019-01-29 ENCOUNTER — Ambulatory Visit: Payer: Medicare HMO | Admitting: Cardiology

## 2019-01-29 ENCOUNTER — Encounter: Payer: Self-pay | Admitting: Cardiology

## 2019-01-29 ENCOUNTER — Telehealth: Payer: Self-pay | Admitting: Cardiovascular Disease

## 2019-01-29 DIAGNOSIS — E785 Hyperlipidemia, unspecified: Secondary | ICD-10-CM | POA: Diagnosis not present

## 2019-01-29 DIAGNOSIS — R310 Gross hematuria: Secondary | ICD-10-CM

## 2019-01-29 DIAGNOSIS — Z9861 Coronary angioplasty status: Secondary | ICD-10-CM | POA: Diagnosis not present

## 2019-01-29 DIAGNOSIS — R079 Chest pain, unspecified: Secondary | ICD-10-CM | POA: Diagnosis not present

## 2019-01-29 DIAGNOSIS — I2510 Atherosclerotic heart disease of native coronary artery without angina pectoris: Secondary | ICD-10-CM | POA: Diagnosis not present

## 2019-01-29 NOTE — Assessment & Plan Note (Signed)
On high dose statin Rx 

## 2019-01-29 NOTE — Assessment & Plan Note (Signed)
Pt c/o increasing chest pain-similar to her pre PCI symptoms

## 2019-01-29 NOTE — Telephone Encounter (Signed)
Spoke with Rachel Valentine. She report experiencing on and off chest pain that's similar to symptoms before Cath. She state Dr. Gwenlyn Found was made aware at last visit that symptoms were only occasional and recommended to continue to watch. Rachel Valentine report since then, symptoms has increased and on 2 occasion has noticed blood in her urine. Rachel Valentine currently denies active pain at this moment. Will route message to triage PA for recommendations.

## 2019-01-29 NOTE — Telephone Encounter (Signed)
New Message   Pt c/o of Chest Pain: STAT if CP now or developed within 24 hours  1. Are you having CP right now? no  2. Are you experiencing any other symptoms (ex. SOB, nausea, vomiting, sweating)? No  3. How long have you been experiencing CP? On and off since the 5/4 when she had her stent put in  4. Is your CP continuous or coming and going? Coming and going   5. Have you taken Nitroglycerin? Yes. She said that she took one last night and it didn't seem to work.     ?

## 2019-01-29 NOTE — Assessment & Plan Note (Signed)
Painless gross hematuria last 24 hours.

## 2019-01-29 NOTE — Telephone Encounter (Signed)
Per Robbie Lis, PA, he recommends pt schedule an appointment for further evaluations. Appointment scheduled for today 5/29 with Kerin Ransom, [place].     COVID-19 Pre-Screening Questions:  . In the past 7 to 10 days have you had a cough,  shortness of breath, headache, congestion, fever (100 or greater) body aches, chills, sore throat, or sudden loss of taste or sense of smell? No . Have you been around anyone with known Covid 19.No . Have you been around anyone who is awaiting Covid 19 test results in the past 7 to 10 days?No . Have you been around anyone who has been exposed to Covid 19, or has mentioned symptoms of Covid 19 within the past 7 to 10 days?No  If you have any concerns/questions about symptoms patients report during screening (either on the phone or at threshold). Contact the provider seeing the patient or DOD for further guidance.  If neither are available contact a member of the leadership team.

## 2019-01-29 NOTE — Patient Instructions (Signed)
Medication Instructions:  Your physician recommends that you continue on your current medications as directed. Please refer to the Current Medication list given to you today.  If you need a refill on your cardiac medications before your next appointment, please call your pharmacy.   Lab work: Please have Labs 7 days before Cath If you have labs (blood work) drawn today and your tests are completely normal, you will receive your results only by: Marland Kitchen MyChart Message (if you have MyChart) OR . A paper copy in the mail If you have any lab test that is abnormal or we need to change your treatment, we will call you to review the results.  Testing/Procedures: Your physician has requested that you have a cardiac catheterization. Cardiac catheterization is used to diagnose and/or treat various heart conditions. Doctors may recommend this procedure for a number of different reasons. The most common reason is to evaluate chest pain. Chest pain can be a symptom of coronary artery disease (CAD), and cardiac catheterization can show whether plaque is narrowing or blocking your heart's arteries. This procedure is also used to evaluate the valves, as well as measure the blood flow and oxygen levels in different parts of your heart. For further information please visit [email or web address]. Please follow instruction sheet, as given. PLEASE CALL THE OFFICE WHEN YOU ARE READY TO SCHEDULE A CATH  Follow-Up: At [facility], you and your health needs are our priority.  As part of our continuing mission to provide you with exceptional heart care, we have created designated Provider Care Teams.  These Care Teams include your primary Cardiologist (physician) and Advanced Practice Providers (APPs -  Physician Assistants and Nurse Practitioners) who all work together to provide you with the care you need, when you need it. You will need a follow up appointment in 3 months.  Please call our office 2 months in advance to  schedule this appointment.  You may see Quay Burow, MD or one of the following Advanced Practice Providers on your designated Care Team:   Kerin Ransom, PA-C Roby Lofts, [place] . Sande Rives, PA-C  Any Other Special Instructions Will Be Listed Below (If Applicable).

## 2019-01-29 NOTE — Progress Notes (Signed)
01/29/2019 Kathleen   09-06-1940  [id number]  Primary Physician Angelina Pih, MD Primary Cardiologist: Dr Gwenlyn Found  HPI:  78 y/o female presented with [place] 01/03/2019.  Her Troponin was negative.  Cath 01/04/2019 showed extensive mid-distal CFX disease and proximal and distal RCA disease.  She underwent PCI with DES to the CFX followed by staged atherectomy/ PTCA/ DES x 2 to the RCA 01/05/2019.  She saw Dr Gwenlyn Found in follow up 01/19/2019. She noted she did well for about a week but then noted she had some SSCP- but not bad.  He encouraged her to increase her activity.    She called today with complaints of increasing SSCP.  She tells me it is similar to her pre PCI symptoms though not necessarily exertional.  She feels her symptoms have been increasing in frequency the last 2-3 days. She has had pain off an on today. Last night she took one NTG with no relief.    Current Outpatient Medications  Medication Sig Dispense Refill  . aspirin EC 81 MG tablet Take 1 tablet (81 mg total) by mouth daily. 150 tablet 0  . atorvastatin (LIPITOR) 80 MG tablet Take 1 tablet (80 mg total) by mouth daily at 6 PM. 60 tablet 0  . Azelastine HCl 0.15 % SOLN Place 2 sprays into the nose daily.    . calcium citrate (CALCITRATE - DOSED IN MG ELEMENTAL CALCIUM) 950 MG tablet Take 200 mg of elemental calcium by mouth daily.    . cholecalciferol (VITAMIN D) 1000 units tablet Take 1,000 Units by mouth daily.    . clopidogrel (PLAVIX) 75 MG tablet Take 1 tablet (75 mg total) by mouth daily with breakfast. 120 tablet 0  . levocetirizine (XYZAL) 5 MG tablet Take 5 mg by mouth every evening.    . metoprolol tartrate (LOPRESSOR) 25 MG tablet Take 0.5 tablets (12.5 mg total) by mouth 2 (two) times daily. 60 tablet 0  . nitroGLYCERIN (NITROSTAT) 0.4 MG SL tablet Place 1 tablet (0.4 mg total) under the tongue every 5 (five) minutes as needed for chest pain. 30 tablet 0  . omeprazole (PRILOSEC) 40 MG capsule Take 40 mg  by mouth daily.     No current facility-administered medications for this visit.     Allergies  Allergen Reactions  . Prednisone Other (See Comments)    Blood pressure high when she takes it  . Penicillins Rash    Past Medical History:  Diagnosis Date  . Bowel obstruction [facility])     Social History   Socioeconomic History  . Marital status: Widowed    Spouse name: Not on file  . Number of children: Not on file  . Years of education: Not on file  . Highest education level: Not on file  Occupational History  . Not on file  Social Needs  . Financial resource strain: Not on file  . Food insecurity:    Worry: Not on file    Inability: Not on file  . Transportation needs:    Medical: Not on file    Non-medical: Not on file  Tobacco Use  . Smoking status: Former Smoker    Last attempt to quit: 01/06/1999    Years since quitting: 20.0  . Smokeless tobacco: Never Used  Substance and Sexual Activity  . Alcohol use: No  . Drug use: Not on file  . Sexual activity: Not on file  Lifestyle  . Physical activity:    Days per week: Not on  file    Minutes per session: Not on file  . Stress: Not on file  Relationships  . Social connections:    Talks on phone: Not on file    Gets together: Not on file    Attends religious service: Not on file    Active member of club or organization: Not on file    Attends meetings of clubs or organizations: Not on file    Relationship status: Not on file  . Intimate partner violence:    Fear of current or ex partner: Not on file    Emotionally abused: Not on file    Physically abused: Not on file    Forced sexual activity: Not on file  Other Topics Concern  . Not on file  Social History Narrative  . Not on file     Family History  Problem Relation Age of Onset  . Heart failure Mother   . Stroke Father   . Stroke Brother      Review of Systems: General: negative for chills, fever, night sweats or weight changes.  Cardiovascular:  negative for dyspnea on exertion, edema, orthopnea, palpitations, paroxysmal nocturnal dyspnea or shortness of breath Dermatological: negative for rash Respiratory: negative for cough or wheezing Urologic: remarkable for painless hematuria Abdominal: negative for nausea, vomiting, diarrhea, bright red blood per rectum, melena, or hematemesis Neurologic: negative for visual changes, syncope, or dizziness All other systems reviewed and are otherwise negative except as noted above.    There were no vitals taken for this visit.  General appearance: alert, cooperative and no distress Neck: no carotid bruit and no JVD Lungs: clear to auscultation bilaterally Heart: regular rate and rhythm Abdomen: soft, non-tender; bowel sounds normal; no masses,  no organomegaly Extremities: extremities normal, atraumatic, no cyanosis or edema Pulses: 2+ and symmetric Skin: Skin color, texture, turgor normal. No rashes or lesions Neurologic: Grossly normal  EKG NSR- no acute changes-HR 70  ASSESSMENT AND PLAN:   Chest pain with moderate risk of acute coronary syndrome Pt c/o increasing chest pain-similar to her pre PCI symptoms  CAD S/P percutaneous coronary angioplasty CFX PCI x 2 01/04/2019 followed by CFX PCI 01/06/2019  Dyslipidemia, goal LDL below 70 On high dose statin Rx  Painless hematuria- ? UTI- check culture  PLAN  Reviewed with Dr Debara Pickett in the office who saw her with me.  We suggested relook cath next week but the patient is now somewhat reluctant and "will let us know".  UA checked today.   Kerin Ransom PA-C 01/29/2019 4:20 PM

## 2019-01-29 NOTE — Assessment & Plan Note (Signed)
CFX PCI x 2 01/04/2019 followed by CFX PCI 01/06/2019

## 2019-01-30 LAB — URINALYSIS
Bilirubin, UA: NEGATIVE
Glucose, UA: NEGATIVE
Ketones, UA: NEGATIVE
Nitrite, UA: NEGATIVE
Specific Gravity, UA: 1.016 (ref 1.005–1.030)
Urobilinogen, Ur: 0.2 mg/dL (ref 0.2–1.0)
pH, UA: 5 (ref 5.0–7.5)

## 2019-02-01 ENCOUNTER — Telehealth: Payer: Self-pay | Admitting: Cardiology

## 2019-02-01 ENCOUNTER — Telehealth: Payer: Self-pay

## 2019-02-01 NOTE — Telephone Encounter (Signed)
I called the patient back this afternoon, she had called earlier today with questions.  She tells me her chest pain is better.  She asked about a coronary CT scan, I did not think that was the appropriate test or indicated.  She has had some slight hematuria off-and-on and I encouraged her to follow-up with her PCP about this but did not stop her aspirin or Plavix.  I asked her to ease back into her usual walking routine and let us know how she is doing.  She is agreeable to this approach and was appreciative of the call.  Kerin Ransom PA-C 02/01/2019 4:59 PM

## 2019-02-01 NOTE — Telephone Encounter (Signed)
Called patient to give her lab results and she had questions about her chest pain and wanting to know if she could have something done less invasive versus having cath. She stated she did some research this weekend ans ha some questions. Advised patient that I would have Lurena Joiner give her a call by the end of the day to answer her questions. She voiced understanding and stated she is about to run errands an a call at the end of the day would be perfect for her.

## 2019-02-05 ENCOUNTER — Encounter (HOSPITAL_COMMUNITY): Payer: Self-pay

## 2019-02-05 ENCOUNTER — Ambulatory Visit (HOSPITAL_COMMUNITY): Admit: 2019-02-05 | Payer: Medicare HMO | Admitting: Cardiovascular Disease

## 2019-02-05 SURGERY — LEFT HEART CATH AND CORONARY ANGIOGRAPHY
Anesthesia: LOCAL

## 2019-02-17 DIAGNOSIS — H43813 Vitreous degeneration, bilateral: Secondary | ICD-10-CM | POA: Diagnosis not present

## 2019-02-17 DIAGNOSIS — H43393 Other vitreous opacities, bilateral: Secondary | ICD-10-CM | POA: Diagnosis not present

## 2019-03-01 DIAGNOSIS — R69 Illness, unspecified: Secondary | ICD-10-CM | POA: Diagnosis not present

## 2019-03-02 NOTE — Telephone Encounter (Signed)
Opened in error

## 2019-03-08 ENCOUNTER — Telehealth (HOSPITAL_COMMUNITY): Payer: Self-pay | Admitting: *Deleted

## 2019-03-08 NOTE — Telephone Encounter (Signed)
-----   Message from [name], MD sent at 03/08/2019 12:03 PM EDT ----- Regarding: RE: Ok to participate in group exercise at Cardiac Rehab Yes, unless she develops recurrent CP  JJB ----- Message ----- From: Rowe Pavy, RN Sent: 03/08/2019  10:28 AM EDT To: [name], MD Subject: Ok to participate in group exercise at [facility]   Dr. Gwenlyn Found,  The above patient referred to Cardiac Rehab s/p 01/04/19  DES to cfx  and 5/5/ atherectomy to rca.  Pt completed first follow up with you on 01/19/19.  Pt seen on 5/29 by Kerin Ransom PA for complaint of chest pain.  Recommended relook cath that was scheduled for 6/5.  Pt called back on 6/1 to cancel the cath wanted a less invasive course of treatment.  Next follow up with you is 05/12/19.  Is this pt medically stable to proceed with group exercise in cardiac rehab prior to next follow in September?   Thanks for your input Maurice Small RN, BSN Cardiac and Pulmonary Rehab Nurse Navigator

## 2019-03-11 ENCOUNTER — Telehealth (HOSPITAL_COMMUNITY): Payer: Self-pay

## 2019-03-11 NOTE — Telephone Encounter (Signed)
Pt insurance is active and benefits verified through [facility]. Co-pay $45.00, DED $0.00/$0.00 met, out of pocket $4,200.00/$814.37 met, co-insurance 0%. No pre-authorization required. Danae Chen S./[employer], 03/11/2019 @ 1:52PM, FHP#[id number]

## 2019-03-19 DIAGNOSIS — I6523 Occlusion and stenosis of bilateral carotid arteries: Secondary | ICD-10-CM | POA: Diagnosis not present

## 2019-03-19 DIAGNOSIS — E7800 Pure hypercholesterolemia, unspecified: Secondary | ICD-10-CM | POA: Diagnosis not present

## 2019-03-19 DIAGNOSIS — I700 Atherosclerosis of aorta: Secondary | ICD-10-CM | POA: Diagnosis not present

## 2019-03-19 DIAGNOSIS — I10 Essential (primary) hypertension: Secondary | ICD-10-CM | POA: Diagnosis not present

## 2019-03-19 DIAGNOSIS — I2510 Atherosclerotic heart disease of native coronary artery without angina pectoris: Secondary | ICD-10-CM | POA: Diagnosis not present

## 2019-03-19 DIAGNOSIS — M810 Age-related osteoporosis without current pathological fracture: Secondary | ICD-10-CM | POA: Diagnosis not present

## 2019-03-19 DIAGNOSIS — Z0000 Encounter for general adult medical examination without abnormal findings: Secondary | ICD-10-CM | POA: Diagnosis not present

## 2019-03-28 IMAGING — US US CAROTID DUPLEX BILAT
1 series · 13 of 24 positions shown · non-contrast
Comparison: None.

CLINICAL DATA: Bilateral carotid stenosis

EXAM:
BILATERAL CAROTID DUPLEX ULTRASOUND
TECHNIQUE: Gray scale imaging, color Doppler and duplex ultrasound were
performed of bilateral carotid and vertebral arteries in the neck.

[Series 1: us carotid duplex bilat · 0.06mm/px · 13 of 67 slices shown]
[im 1/67]
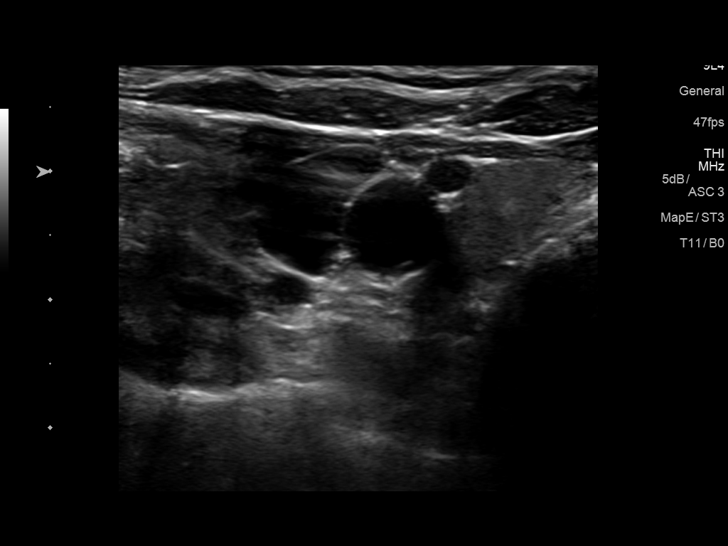
[im 6/67]
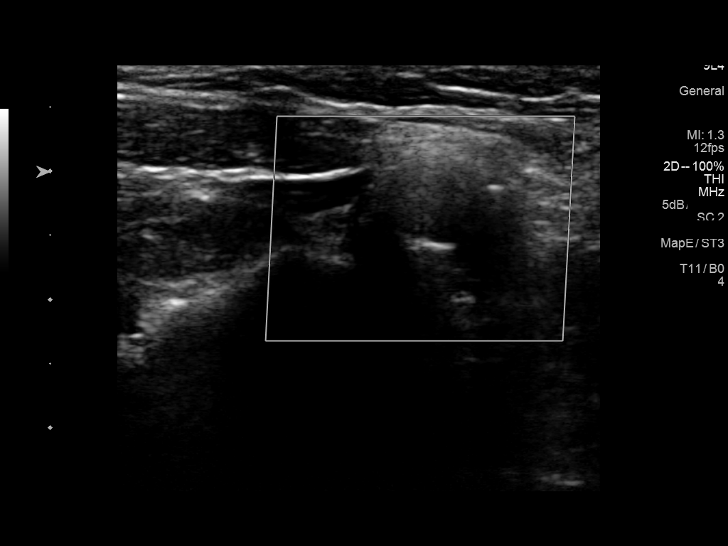
[im 12/67]
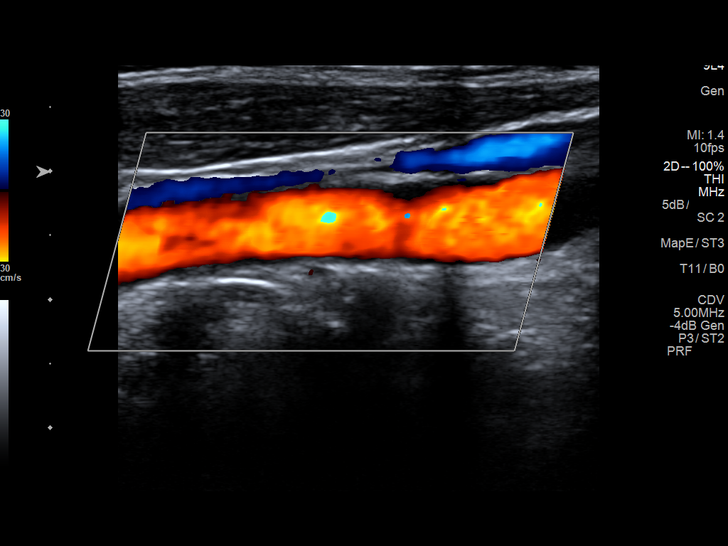
[im 18/67]
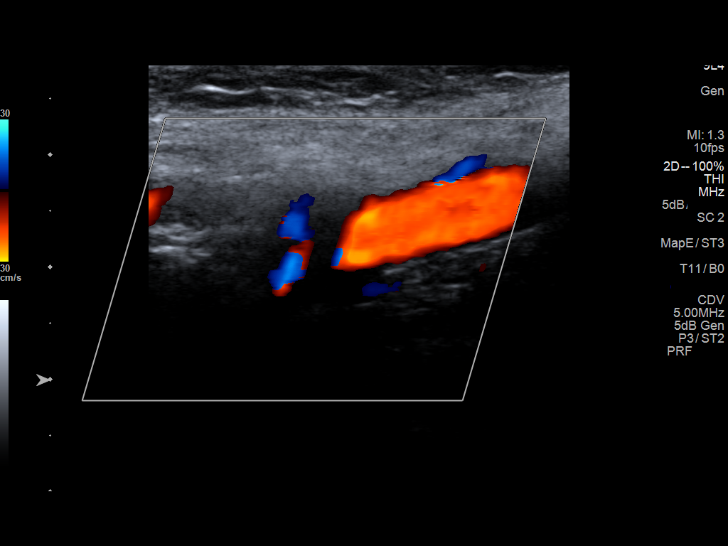
[im 23/67]
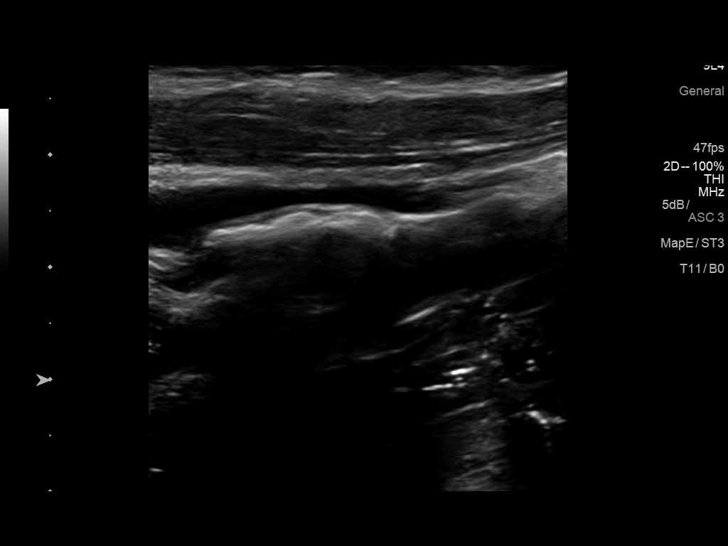
[im 29/67]
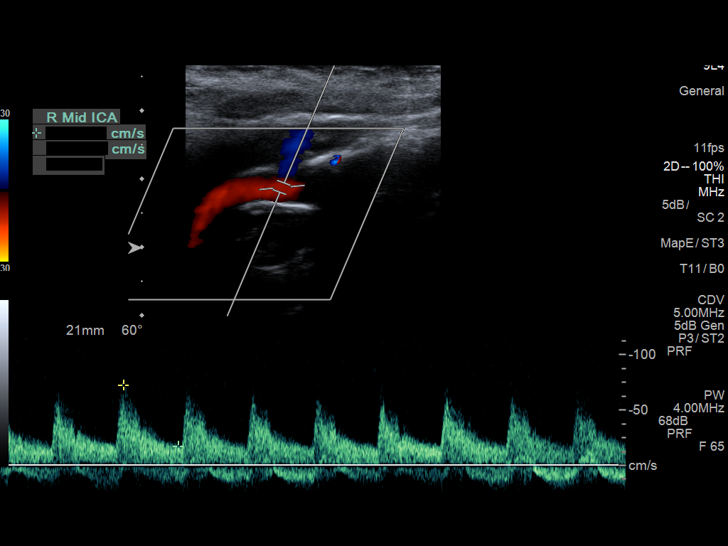
[im 35/67]
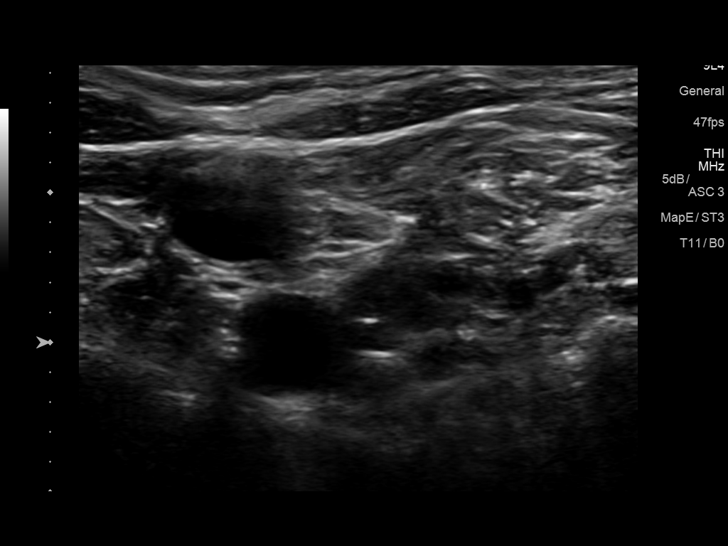
[im 38/67]
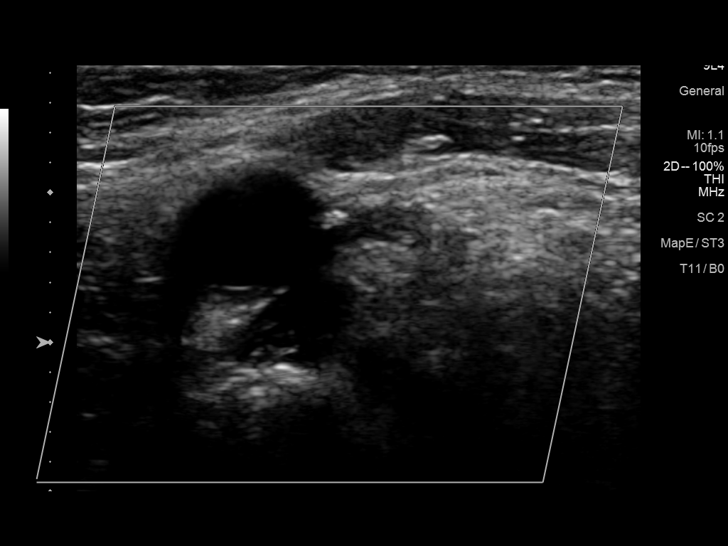
[im 44/67]
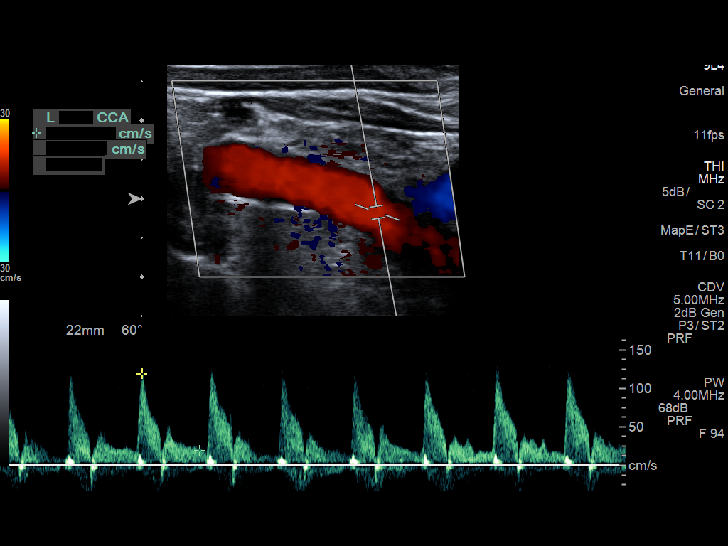
[im 49/67]
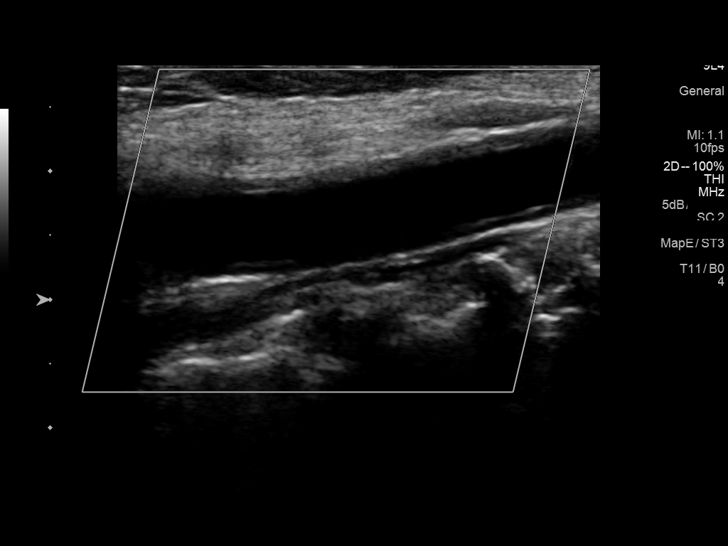
[im 55/67]
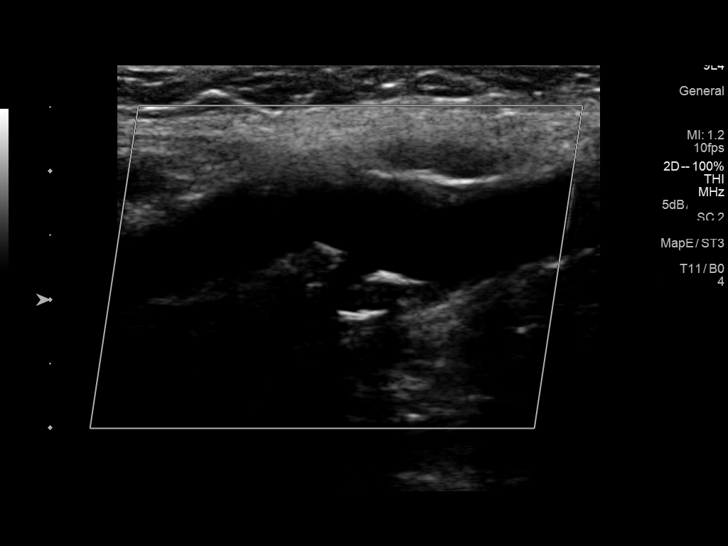
[im 61/67]
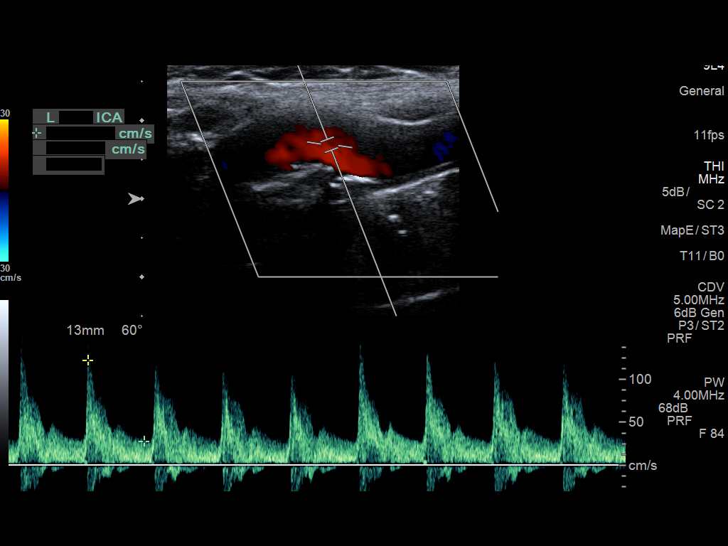
[im 67/67]
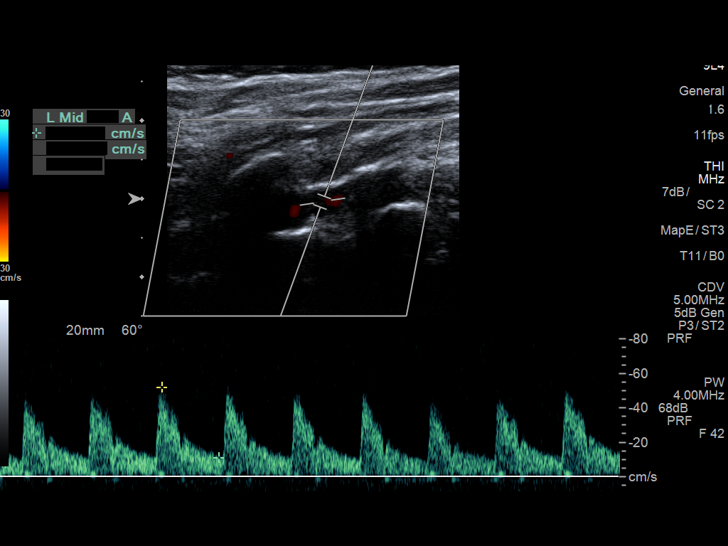

[13 of 24 positions shown; findings below may reference images not displayed]

FINDINGS: Criteria: Quantification of carotid stenosis is based on velocity
parameters that correlate the residual internal carotid diameter
with NASCET-based stenosis levels, using the diameter of the distal
internal carotid lumen as the denominator for stenosis measurement.

The following velocity measurements were obtained:

RIGHT

ICA: 100 cm/sec

CCA: 114 cm/sec

SYSTOLIC ICA/CCA RATIO:

ECA: 143 cm/sec

LEFT

ICA: 122 cm/sec

CCA: 119 cm/sec

SYSTOLIC ICA/CCA RATIO:

ECA: 268 cm/sec

RIGHT CAROTID ARTERY: Moderate calcified plaque with shadowing in
the bulb. Low resistance internal carotid Doppler pattern is
preserved.

RIGHT VERTEBRAL ARTERY:  Antegrade.

LEFT CAROTID ARTERY: Moderate calcified plaque in the bulb. Low
resistance internal carotid Doppler pattern is preserved.

LEFT VERTEBRAL ARTERY:  Antegrade.

Upper extremity blood pressures: RIGHT: 133/60 LEFT: 146/59
IMPRESSION: Less than 50% stenosis in the right and left internal carotid
arteries.

## 2019-05-12 ENCOUNTER — Other Ambulatory Visit: Payer: Self-pay

## 2019-05-12 ENCOUNTER — Ambulatory Visit: Payer: Medicare HMO | Admitting: Cardiovascular Disease

## 2019-05-12 ENCOUNTER — Encounter: Payer: Self-pay | Admitting: Cardiovascular Disease

## 2019-05-12 DIAGNOSIS — E785 Hyperlipidemia, unspecified: Secondary | ICD-10-CM

## 2019-05-12 DIAGNOSIS — I2510 Atherosclerotic heart disease of native coronary artery without angina pectoris: Secondary | ICD-10-CM

## 2019-05-12 DIAGNOSIS — Z9861 Coronary angioplasty status: Secondary | ICD-10-CM | POA: Diagnosis not present

## 2019-05-12 NOTE — Assessment & Plan Note (Signed)
History of CAD status post admission to the hospital 01/01/2019 with unstable angina.  She ruled out for myocardial infarction.  She will underwent diagnostic catheterization by Dr. Ellyn Hack 01/04/2019 revealing normal LV function, high-grade AV groove circumflex and calcified RCA stenosis with fairly normal LAD.  She underwent LAD intervention by Dr. Ellyn Hack with staged diamondback orbital rotational atherectomy, PCI and drug-eluting stenting of her proximal mid and distal RCA by Dr. Angelena Form 01/06/2019 was discharged the following day on aspirin and Plavix.  She did have some chest pain soon thereafter which resolved fairly quickly with rest.

## 2019-05-12 NOTE — Progress Notes (Signed)
05/12/2019 Rachel Valentine   May 01, 1941  [id number]  Primary Physician Angelina Pih, MD Primary Cardiologist: [name] MD Lupe Carney, [place]  HPI:  Rachel Valentine is a 78 y.o.  widowed Caucasian female mother of 2, grandmother and one grandchild referred to me initially by Dr. Hosie Poisson, her prior PCP.  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    I last saw her virtually 01/19/2019.  She was having palpitations and chest pain.  Her risk factors include hyperlipidemia and family history with a mother who had CABG.  She is never had a heart attack or stroke.  She was admitted to [facility] on 01/01/2023 unstable angina.  She ruled out for myocardial infarction.  She underwent cardiac catheterization by Dr. Ellyn Hack 01/04/2019 revealing normal LV function, high-grade mid AV groove circumflex on a band and high-grade diffuse calcified RCA stenosis with a fairly normal LAD.  She underwent LAD intervention by Dr. Ellyn Hack with staged diamondback orbital rotational atherectomy, PCI and drug-eluting stenting of the proximal mid and distal RCA by Dr. Angelena Form on 01/06/2019.  She was discharged home the following day.  She is felt well and denies chest pain.  She is on aspirin and Plavix as well as high-dose atorvastatin and a beta-blocker.  She is a fairly active exerciser and currently is back walking 2.5 to 4 miles a day 6 to 7 days a week without limitation.  Since I saw her 4 months ago she continues to do well, exercises daily and is asymptomatic.   The patient does not have symptoms concerning for COVID-19 infection (fever, chills, cough, or new shortness of breath).     Current Meds  Medication Sig  . aspirin EC 81 MG tablet Take 1 tablet (81 mg total) by mouth daily.  Marland Kitchen atorvastatin (LIPITOR) 80 MG tablet Take 1 tablet (80 mg total) by mouth daily at 6 PM.  . Azelastine HCl 0.15 % SOLN Place 2  sprays into the nose as needed.   . Biotin 10000 MCG TABS Take by mouth daily.  . Calcium-Phosphorus-Vitamin D (CITRACAL +D3 PO) Take by mouth daily.  . clopidogrel (PLAVIX) 75 MG tablet Take 1 tablet (75 mg total) by mouth daily with breakfast.  . diphenhydrAMINE (BENADRYL) 25 mg capsule Take 25 mg by mouth at bedtime as needed.  Marland Kitchen ipratropium (ATROVENT) 0.03 % nasal spray Place 2 sprays into both nostrils every 12 (twelve) hours.  . metoprolol tartrate (LOPRESSOR) 25 MG tablet Take 0.5 tablets (12.5 mg total) by mouth 2 (two) times daily.  . nitroGLYCERIN (NITROSTAT) 0.4 MG SL tablet Place 1 tablet (0.4 mg total) under the tongue every 5 (five) minutes as needed for chest pain.  Marland Kitchen omeprazole (PRILOSEC) 40 MG capsule Take 40 mg by mouth as needed.   . [DISCONTINUED] levocetirizine (XYZAL) 5 MG tablet Take 5 mg by mouth every evening.     Allergies  Allergen Reactions  . Prednisone Other (See Comments)    Blood pressure high when she takes it  . Penicillins Rash    Social History   Socioeconomic History  . Marital status: Widowed    Spouse name: Not on file  . Number of children: Not on file  . Years of education: Not on file  . Highest education level: Not on file  Occupational History  . Not on file  Social Needs  . Financial resource strain: Not on file  .  Food insecurity    Worry: Not on file    Inability: Not on file  . Transportation needs    Medical: Not on file    Non-medical: Not on file  Tobacco Use  . Smoking status: Former Smoker    Quit date: 01/06/1999    Years since quitting: 20.3  . Smokeless tobacco: Never Used  Substance and Sexual Activity  . Alcohol use: No  . Drug use: Not on file  . Sexual activity: Not on file  Lifestyle  . Physical activity    Days per week: Not on file    Minutes per session: Not on file  . Stress: Not on file  Relationships  . Social Herbalist on phone: Not on file    Gets together: Not on file    Attends  religious service: Not on file    Active member of club or organization: Not on file    Attends meetings of clubs or organizations: Not on file    Relationship status: Not on file  . Intimate partner violence    Fear of current or ex partner: Not on file    Emotionally abused: Not on file    Physically abused: Not on file    Forced sexual activity: Not on file  Other Topics Concern  . Not on file  Social History Narrative  . Not on file     Review of Systems: General: negative for chills, fever, night sweats or weight changes.  Cardiovascular: negative for chest pain, dyspnea on exertion, edema, orthopnea, palpitations, paroxysmal nocturnal dyspnea or shortness of breath Dermatological: negative for rash Respiratory: negative for cough or wheezing Urologic: negative for hematuria Abdominal: negative for nausea, vomiting, diarrhea, bright red blood per rectum, melena, or hematemesis Neurologic: negative for visual changes, syncope, or dizziness All other systems reviewed and are otherwise negative except as noted above.    Blood pressure 134/63, pulse 69, height 5\' 2"  (1.575 m), weight 119 lb 3.2 oz (54.1 kg).  General appearance: alert and no distress Neck: no adenopathy, no carotid bruit, no JVD, supple, symmetrical, trachea midline and thyroid not enlarged, symmetric, no tenderness/mass/nodules Lungs: clear to auscultation bilaterally Heart: regular rate and rhythm, S1, S2 normal, no murmur, click, rub or gallop Extremities: extremities normal, atraumatic, no cyanosis or edema Pulses: 2+ and symmetric Skin: Skin color, texture, turgor normal. No rashes or lesions Neurologic: Alert and oriented X 3, normal strength and tone. Normal symmetric reflexes. Normal coordination and gait  EKG not performed today  ASSESSMENT AND PLAN:   CAD S/P percutaneous coronary angioplasty History of CAD status post admission to the hospital 01/01/2019 with unstable angina.  She ruled out for  myocardial infarction.  She will underwent diagnostic catheterization by Dr. Ellyn Hack 01/04/2019 revealing normal LV function, high-grade AV groove circumflex and calcified RCA stenosis with fairly normal LAD.  She underwent LAD intervention by Dr. Ellyn Hack with staged diamondback orbital rotational atherectomy, PCI and drug-eluting stenting of her proximal mid and distal RCA by Dr. Angelena Form 01/06/2019 was discharged the following day on aspirin and Plavix.  She did have some chest pain soon thereafter which resolved fairly quickly with rest.  Dyslipidemia, goal LDL below 70 History of dyslipidemia on high-dose statin therapy performed 7/70/20 revealing total cholesterol 136, LDL 58 and HDL [place] MD [facility], [facility] 05/12/2019 11:49 AM

## 2019-05-12 NOTE — Assessment & Plan Note (Signed)
History of dyslipidemia on high-dose statin therapy performed 7/70/20 revealing total cholesterol 136, LDL 58 and HDL 57

## 2019-05-12 NOTE — Patient Instructions (Signed)

## 2019-05-29 DIAGNOSIS — R69 Illness, unspecified: Secondary | ICD-10-CM | POA: Diagnosis not present

## 2019-06-10 DIAGNOSIS — I25119 Atherosclerotic heart disease of native coronary artery with unspecified angina pectoris: Secondary | ICD-10-CM | POA: Diagnosis not present

## 2019-06-10 DIAGNOSIS — I2510 Atherosclerotic heart disease of native coronary artery without angina pectoris: Secondary | ICD-10-CM | POA: Diagnosis not present

## 2019-06-11 ENCOUNTER — Telehealth: Payer: Self-pay | Admitting: Cardiovascular Disease

## 2019-06-11 NOTE — Telephone Encounter (Signed)
Spoke with patient and scheduled visit for next week with Charmayne Sheer PA. Advised patient if chest pains return and not relieved with NTG or has to take 3 to call 911 and go to ED. Patient verbalized understanding.

## 2019-06-11 NOTE — Telephone Encounter (Signed)
Patient c/o Palpitations:  High priority if patient c/o lightheadedness, shortness of breath, or chest pain  1) How long have you had palpitations/irregular HR/ Afib? Are you having the symptoms now? Early this week and palpitations now  2) Are you currently experiencing lightheadedness, SOB or CP?  Had chest pains yesterday off and on, headache yesterday  3) Do you have a history of afib (atrial fibrillation) or irregular heart rhythm? no*  4) Have you checked your BP or HR? (document readings if available): its been all over the place this week  5) Are you experiencing any other symptoms? Flush last night  nauseated and palpitations this morning

## 2019-06-11 NOTE — Telephone Encounter (Signed)
Spoke with patient and she has been having palpitations all week with episode of chest pain where she used her NTG. NTG helped chest pain. Yesterday she walked 3.1 miles and did have some pain but neither of these pains are like when she had her stent placement 01/05/19. Last night flushed and nauseated, feels "washed out". Denies shortness of breath.  First available  appointment mid to late next week. Will forward to Dr Gwenlyn Found for review

## 2019-06-11 NOTE — Telephone Encounter (Signed)
In-person consult with Dr. Gwenlyn Found who advised that pt f/u with an APP; next available appt next week

## 2019-06-14 NOTE — Progress Notes (Addendum)
Cardiology Office Note:    Date:  06/15/2019   ID:  Rachel Valentine, DOB 1941-03-17, MRN [id number]  PCP:  [name], MD  Cardiologist:  Quay Burow, MD   Referring MD: Angelina Pih, MD   Chest pain  History of Present Illness:    Rachel Valentine is a 78 y.o. female with a hx of unstable angina, hypertension, dyslipidemia, and coronary artery disease.  She was last seen by Dr. Gwenlyn Found on 05/12/2019.  She was admitted to [facility] on 01/01/2019 for unstable angina and ruled out for MI.  She underwent cardiac catheterization on 01/04/2019 revealing normal LV function, 90 percent circumflex lesion and a high-grade diffuse calcified RCA stenosis with normal LAD.  She underwent an LAD intervention by Dr. Ellyn Hack receiving a mid circumflex DES  x1.  She  then received an arthrectomy and  DES x2 to her  mid and distal RCA on 01/06/2019 by Dr. Angelena Form.  She was discharged on 01/07/2019.  During 05/12/2019 follow-up visit she felt well, denied chest pain and was compliant with her aspirin and Plavix as well as her high dose Lipitor and beta-blocker.  She has resumed her normal walking activities of 2.5 to 4 miles a day 6-7 times per week. She called the clinic on 06/11/2019 and states that she had been having palpitations with episodes of chest pain.  She used NTG which helped relieve her CP.   She presents the clinic today and states she has had palpitations and intermittent chest discomfort over the last several days starting around 06/06/2019.  She states that on 4 separate occasions she took nitroglycerin to relieve her chest pain.  The nitroglycerin did help on these 4 occasions.  She states that this chest pain was not the same as the chest pain she had with her prior cardiac catheterization.  However, she states that during other occasions her chest discomfort lasted only seconds and then dissipated without interventions.  She also stated that during other periods of chest discomfort over  the last week she took Tums and  Prilosec medication that relieved her symptoms as well.  She further stated that she has had daily palpitations sporadically over the last several days that were in conjunction with her chest discomfort while others were not.  She did not notice any of these cardiac symptoms with increased exertion and had continued her walking regimen until 06/11/2019.  She states that she would have continued with her walking however weather was poor and she felt like she needed to present to the cardiology office for further evaluation.  Today she denies chest pain, shortness of breath, lower extremity edema, fatigue, palpitations, melena, hematuria, hemoptysis, diaphoresis, weakness, presyncope, syncope, orthopnea, and PND.   Past Medical History:  Diagnosis Date  . Bowel obstruction [facility])     Past Surgical History:  Procedure Laterality Date  . ABDOMINAL HYSTERECTOMY    . ABDOMINAL SURGERY    . APPENDECTOMY    . AUGMENTATION MAMMAPLASTY Bilateral    implants removed in 2005  . BREAST EXCISIONAL BIOPSY Left    benign  . CORONARY ATHERECTOMY N/A 01/05/2019   Procedure: CORONARY ATHERECTOMY - STENT;  Surgeon: Burnell Blanks, MD;  Location: [place] [facility];  Service: Cardiovascular;  Laterality: N/A;  . CORONARY STENT INTERVENTION N/A 01/04/2019   Procedure: CORONARY STENT INTERVENTION;  Surgeon: Leonie Man, MD;  Location: [place] [facility];  Service: Cardiovascular;  Laterality: N/A;  . LEFT HEART CATH AND CORONARY ANGIOGRAPHY N/A  01/04/2019   Procedure: LEFT HEART CATH AND CORONARY ANGIOGRAPHY;  Surgeon: Leonie Man, MD;  Location: [place] [facility];  Service: Cardiovascular;  Laterality: N/A;  . TEMPORARY PACEMAKER N/A 01/05/2019   Procedure: TEMPORARY PACEMAKER;  Surgeon: Burnell Blanks, MD;  Location: [place] [facility];  Service: Cardiovascular;  Laterality: N/A;    Current Medications: Current Meds  Medication Sig  . aspirin EC 81 MG  tablet Take 1 tablet (81 mg total) by mouth daily.  . Biotin 10000 MCG TABS Take by mouth daily.  . Calcium-Phosphorus-Vitamin D (CITRACAL +D3 PO) Take by mouth daily.  . clopidogrel (PLAVIX) 75 MG tablet Take 1 tablet (75 mg total) by mouth daily with breakfast.  . diphenhydrAMINE (BENADRYL) 25 mg capsule Take 25 mg by mouth at bedtime as needed.  Marland Kitchen ipratropium (ATROVENT) 0.03 % nasal spray Place 2 sprays into both nostrils every 12 (twelve) hours.  . metoprolol tartrate (LOPRESSOR) 25 MG tablet Take 0.5 tablets (12.5 mg total) by mouth 2 (two) times daily.  . nitroGLYCERIN (NITROSTAT) 0.4 MG SL tablet Place 1 tablet (0.4 mg total) under the tongue every 5 (five) minutes as needed for chest pain.  . rosuvastatin (CRESTOR) 40 MG tablet Take 1 tablet by mouth daily.  . [DISCONTINUED] omeprazole (PRILOSEC) 40 MG capsule Take 40 mg by mouth as needed.      Allergies:   Prednisone and Penicillins   Social History   Socioeconomic History  . Marital status: Widowed    Spouse name: Not on file  . Number of children: Not on file  . Years of education: Not on file  . Highest education level: Not on file  Occupational History  . Not on file  Social Needs  . Financial resource strain: Not on file  . Food insecurity    Worry: Not on file    Inability: Not on file  . Transportation needs    Medical: Not on file    Non-medical: Not on file  Tobacco Use  . Smoking status: Former Smoker    Quit date: 01/06/1999    Years since quitting: 20.4  . Smokeless tobacco: Never Used  Substance and Sexual Activity  . Alcohol use: No  . Drug use: Not on file  . Sexual activity: Not on file  Lifestyle  . Physical activity    Days per week: Not on file    Minutes per session: Not on file  . Stress: Not on file  Relationships  . Social Herbalist on phone: Not on file    Gets together: Not on file    Attends religious service: Not on file    Active member of club or organization: Not on  file    Attends meetings of clubs or organizations: Not on file    Relationship status: Not on file  Other Topics Concern  . Not on file  Social History Narrative  . Not on file     Family History: The patient' sfamily history includes Heart failure in her mother; Stroke in her brother and father.  ROS:   Please see the history of present illness.     All other systems reviewed and are negative.  EKGs/Labs/Other Studies Reviewed:    The following studies were reviewed today: Echocardiogram 01/02/2019 IMPRESSIONS    1. The left ventricle has hyperdynamic systolic function, with an ejection fraction of >65%. The cavity size was normal. Left ventricular diastolic Doppler parameters are indeterminate.  2. The right  ventricle has normal systolic function. The cavity was normal. There is no increase in right ventricular wall thickness.  3. The mitral valve is grossly normal. There is mild mitral annular calcification present.  4. The aortic valve is tricuspid. Mild thickening of the aortic valve. Mild calcification of the aortic valve. No stenosis of the aortic valve.  5. The aortic root is normal in size and structure.  6. The interatrial septum was not assessed.  EKG:  EKG is ordered today.  The ekg ordered today demonstrates normal sinus rhythm 62 bpm   EKG 01/29/2019 Normal sinus rhythm 71 bpm  Cardiac catheterization 01/04/2019 SUMMARY  Severe two-vessel CAD involving a focal 90% mid circumflex followed by 80% distal circumflex as well as diffuse calcified 99%, 80% and 90% stenoses in the proximal, mid, distal RCA.  Successful DES PCI of mid circumflex 90% lesion using resolute DES 2.5 mm x 12 mm (2.8 mm), and PTCA only of the distal circumflex reducing 80% stenosis to 30% using 1.5 mm balloon.  Preserved LVEF with normal LVEDP.  Systemic hypertension  RECOMMENDATIONS  The patient has severe residual RCA disease that is extremely calcified and will require atherectomy.   She will be tentatively staged for orbital atherectomy based DES PCI of the RCA with Dr. Angelena Form tomorrow afternoon following hydration. tic Dominance: Right  Intervention     Cardiac catheterization 01/05/2019  Prox RCA lesion is 50% stenosed.  Prox RCA to Mid RCA lesion is 99% stenosed.  Mid RCA to Dist RCA lesion is 90% stenosed.  A drug-eluting stent was successfully placed using a STENT SYNERGY DES 2.5X38.  Post intervention, there is a 0% residual stenosis.  A drug-eluting stent was successfully placed using a STENT SYNERGY DES 2.75X16.  Post intervention, there is a 0% residual stenosis.   1. Successful orbital atherectomy/PTCA/DES x 2 mid and distal RCA  Continue DAPT with ASA/Plavix for one year. Continue statin and beta blocker  Coronary Diagrams  Diagnostic Dominance: Right  Intervention     Recent Labs: 01/01/2019: B Natriuretic Peptide 28.5; Magnesium 2.3; TSH 3.976 01/06/2019: BUN 17; Creatinine, Ser 0.81; Hemoglobin 10.7; Platelets 239; Potassium 3.9; Sodium 138  Recent Lipid Panel    Component Value Date/Time   CHOL 178 01/02/2019 0216   TRIG 70 01/02/2019 0216   HDL 79 01/02/2019 0216   CHOLHDL 2.3 01/02/2019 0216   VLDL 14 01/02/2019 0216   LDLCALC 85 01/02/2019 0216    Physical Exam:    VS:  BP 116/64   Pulse 62   Ht 5\' 2"  (1.575 m)   Wt 118 lb 12.8 oz (53.9 kg)   SpO2 99%   BMI 21.73 kg/m     Wt Readings from Last 3 Encounters:  06/15/19 118 lb 12.8 oz (53.9 kg)  05/12/19 119 lb 3.2 oz (54.1 kg)  01/29/19 118 lb 9.6 oz (53.8 kg)     GEN:  Well nourished, well developed in no acute distress HEENT: Normal NECK: No JVD; No carotid bruits LYMPHATICS: No lymphadenopathy CARDIAC: RRR, no murmurs, rubs, gallops RESPIRATORY:  Clear to auscultation without rales, wheezing or rhonchi  ABDOMEN: Soft, non-tender, non-distended MUSCULOSKELETAL:  No edema; No deformity  SKIN: Warm and dry NEUROLOGIC:  Alert and oriented x 3 PSYCHIATRIC:   Normal affect   ASSESSMENT/ Plan   Palpitations-has noticed palpitations daily sporadically throughout the last week, starting around 06/06/2019. Ekg today shows normal sinus rhythm 62 bpm. Order 14-day Zio monitor Order TSH, BMP, CBC, and magnesium  CAD status  post percutaneous coronary angioplasty- no chest pain today.  Has had intermittent chest pain throughout the last week which she took nitroglycerin for on 4 separate occasions relieving her chest discomfort.  However, she states that she has also taken Tums and her omeprazole which relieved her chest discomfort as well.  Finally, on other occasions she stated the chest discomfort lasted only a few seconds and dissipated with no intervention.  She received circumflex DES x1 on 01/04/2019 and DES x2  mid and distal RCA on 01/05/2019. Start Norvasc 2.5 mg daily- opted for Norvasc in this case over Imdur due to her headache symptoms when taking nitroglycerin.  Suspect given her symptoms that this is more GI in nature however, will add Norvasc to her regimen. Continue aspirin 81 mg tablet daily Continue clopidogrel 75 mg tablet daily Continue metoprolol tartrate 12.5 mg twice daily Continue nitroglycerin 0.4 mg tablet sublingual as needed Heart healthy low-sodium diet Continue physical activity   Hyperlipidemia-01/02/2019: Cholesterol 178; HDL 79; LDL Cholesterol 85; Triglycerides 70; VLDL 14 Continue atorvastatin 80 mg tablet daily Heart healthy low-sodium high-fiber diet Maintain physical activity  Essential hypertension- BP today 116/64.  Has been somewhat labile at home. Continue metoprolol tartrate 12.5 mg twice daily Heart healthy low-sodium high-fiber diet Maintain physical activity  GERD- was not consistently taking omeprazole.  Will ask her to switch to Protonix and take it daily. Order Protonix 40 mg tablet daily and as needed Heart healthy low-sodium high-fiber diet-avoid spicy food, citrus, high-fat food, tomato-based products,  caffeine, alcohol, caffeinated beverages and caffeine.  Disposition: Follow-up with APP in 2 to 3 weeks. In order of problems listed above:  Chest pain, unspecified type - Plan: EKG 12-Lead, LONG TERM MONITOR (3-14 DAYS), TSH, Magnesium, CBC, Basic metabolic panel  Palpitations - Plan: EKG 12-Lead, LONG TERM MONITOR (3-14 DAYS), TSH, Magnesium, CBC, Basic metabolic panel  Dyslipidemia, goal LDL below 70  Gastroesophageal reflux disease with esophagitis without hemorrhage  Essential hypertension   Medication Adjustments/Labs and Tests Ordered: Current medicines are reviewed at length with the patient today.  Concerns regarding medicines are outlined above.  Orders Placed This Encounter  Procedures  . TSH  . Magnesium  . CBC  . Basic metabolic panel  . LONG TERM MONITOR (3-14 DAYS)  . EKG 12-Lead   Meds ordered this encounter  Medications  . pantoprazole (PROTONIX) 40 MG tablet    Sig: Take 1 tablet (40 mg total) by mouth daily.    Dispense:  30 tablet    Refill:  6  . amLODipine (NORVASC) 2.5 MG tablet    Sig: Take 1 tablet (2.5 mg total) by mouth at bedtime.    Dispense:  30 tablet    Refill:  6    Signed, Deberah Pelton, NP  06/15/2019 12:09 PM    [facility]

## 2019-06-15 ENCOUNTER — Encounter: Payer: Self-pay | Admitting: Physician Assistant

## 2019-06-15 ENCOUNTER — Telehealth: Payer: Self-pay | Admitting: General Practice

## 2019-06-15 ENCOUNTER — Other Ambulatory Visit: Payer: Self-pay

## 2019-06-15 ENCOUNTER — Ambulatory Visit: Payer: Medicare HMO | Admitting: General Practice

## 2019-06-15 VITALS — BP 116/64 | HR 62 | Ht 62.0 in | Wt 118.8 lb

## 2019-06-15 DIAGNOSIS — K2100 Gastro-esophageal reflux disease with esophagitis, without bleeding: Secondary | ICD-10-CM | POA: Diagnosis not present

## 2019-06-15 DIAGNOSIS — R079 Chest pain, unspecified: Secondary | ICD-10-CM | POA: Diagnosis not present

## 2019-06-15 DIAGNOSIS — I10 Essential (primary) hypertension: Secondary | ICD-10-CM | POA: Diagnosis not present

## 2019-06-15 DIAGNOSIS — E785 Hyperlipidemia, unspecified: Secondary | ICD-10-CM | POA: Diagnosis not present

## 2019-06-15 DIAGNOSIS — R002 Palpitations: Secondary | ICD-10-CM

## 2019-06-15 LAB — CBC
Hematocrit: 42.1 % (ref 34.0–46.6)
Hemoglobin: 13.8 g/dL (ref 11.1–15.9)
MCH: 30 pg (ref 26.6–33.0)
MCHC: 32.8 g/dL (ref 31.5–35.7)
MCV: 92 fL (ref 79–97)
Platelets: 321 10*3/uL (ref 150–450)
RBC: 4.6 x10E6/uL (ref 3.77–5.28)
RDW: 13.3 % (ref 11.7–15.4)
WBC: 6.8 10*3/uL (ref 3.4–10.8)

## 2019-06-15 LAB — BASIC METABOLIC PANEL
BUN/Creatinine Ratio: 25 (ref 12–28)
BUN: 18 mg/dL (ref 8–27)
CO2: 22 mmol/L (ref 20–29)
Calcium: 9.7 mg/dL (ref 8.7–10.3)
Chloride: 102 mmol/L (ref 96–106)
Creatinine, Ser: 0.71 mg/dL (ref 0.57–1.00)
GFR calc Af Amer: 95 mL/min/{1.73_m2} (ref >59)
GFR calc non Af Amer: 82 mL/min/{1.73_m2} (ref >59)
Glucose: 89 mg/dL (ref 65–99)
Potassium: 4.7 mmol/L (ref 3.5–5.2)
Sodium: 139 mmol/L (ref 134–144)

## 2019-06-15 LAB — MAGNESIUM: Magnesium: 2.4 mg/dL — ABNORMAL HIGH (ref 1.6–2.3)

## 2019-06-15 LAB — TSH: TSH: 0.947 u[IU]/mL (ref 0.450–4.500)

## 2019-06-15 MED ORDER — PANTOPRAZOLE SODIUM 40 MG PO TBEC: 40.0000 mg | DELAYED_RELEASE_TABLET | Freq: Every day | ORAL | 6 refills | Status: DC

## 2019-06-15 MED ORDER — AMLODIPINE BESYLATE 2.5 MG PO TABS: 2.5000 mg | ORAL_TABLET | Freq: Every day | ORAL | 6 refills | Status: DC

## 2019-06-15 NOTE — Telephone Encounter (Signed)
Patient states she was given a new medication today, amLODipine (NORVASC) 2.5 MG tablet, she wants to know which of her old medications this is replacing.

## 2019-06-15 NOTE — Patient Instructions (Addendum)
Medication Instructions:   STOP Prilosec  START Protonix (Pantoprazole) 40 mg daily.  If you need a refill on your cardiac medications before your next appointment, please call your pharmacy.   Labwork: Your physician recommends that you return for lab work today: TSH, Magnesium, BMET, CBC   Testing/Procedures: Your physician has recommended that you wear an ZIO patch monitor (14 days). Monitors are medical devices that record the heart's electrical activity. Doctors most often [place] these monitors to diagnose arrhythmias. Arrhythmias are problems with the speed or rhythm of the heartbeat. The monitor is a small, portable device. You can wear one while you do your normal daily activities. This is usually used to diagnose what is causing palpitations/syncope (passing out).  Follow-Up: At [facility] ([facility]), you and your health needs are our priority.  As part of our continuing mission to provide you with exceptional heart care, we have created designated Provider Care Teams.  These Care Teams include your primary Cardiologist (physician) and Advanced Practice Providers (APPs -  Physician Assistants and Nurse Practitioners) who all work together to provide you with the care you need, when you need it. . Please schedule an appointment to see Coletta Memos, NP in 3-4 weeks when Dr. Gwenlyn Found also in office if possible.

## 2019-06-15 NOTE — Telephone Encounter (Signed)
S/w jesse he states that amlodipine was added and not in place of anything else.  Pt notified she expresses thanks for the info

## 2019-06-18 ENCOUNTER — Telehealth: Payer: Self-pay

## 2019-06-18 NOTE — Telephone Encounter (Signed)
Spoke to pt, went over monitor instructions. Verified address. 14 day ZIO ordered to be mailed to pt's home address.

## 2019-06-21 ENCOUNTER — Telehealth: Payer: Self-pay | Admitting: Cardiovascular Disease

## 2019-06-21 ENCOUNTER — Encounter: Payer: Self-pay | Admitting: Adult Health

## 2019-06-21 ENCOUNTER — Other Ambulatory Visit (HOSPITAL_COMMUNITY)
Admission: RE | Admit: 2019-06-21 | Discharge: 2019-06-21 | Disposition: A | Discharge status: Home / Self Care | Payer: Medicare HMO | Source: Ambulatory Visit | Attending: Cardiovascular Disease | Admitting: Cardiovascular Disease

## 2019-06-21 ENCOUNTER — Telehealth (INDEPENDENT_AMBULATORY_CARE_PROVIDER_SITE_OTHER): Payer: Medicare HMO | Admitting: Adult Health

## 2019-06-21 VITALS — BP 128/66 | HR 75 | Ht 62.0 in | Wt 118.0 lb

## 2019-06-21 DIAGNOSIS — R079 Chest pain, unspecified: Secondary | ICD-10-CM | POA: Diagnosis not present

## 2019-06-21 DIAGNOSIS — I200 Unstable angina: Secondary | ICD-10-CM

## 2019-06-21 DIAGNOSIS — I2510 Atherosclerotic heart disease of native coronary artery without angina pectoris: Secondary | ICD-10-CM

## 2019-06-21 DIAGNOSIS — Z9861 Coronary angioplasty status: Secondary | ICD-10-CM

## 2019-06-21 DIAGNOSIS — Z01812 Encounter for preprocedural laboratory examination: Secondary | ICD-10-CM | POA: Insufficient documentation

## 2019-06-21 DIAGNOSIS — Z20828 Contact with and (suspected) exposure to other viral communicable diseases: Secondary | ICD-10-CM | POA: Insufficient documentation

## 2019-06-21 NOTE — H&P (View-Only) (Signed)
See H& P.

## 2019-06-21 NOTE — Progress Notes (Signed)
See H& P.

## 2019-06-21 NOTE — Telephone Encounter (Signed)
Incoming call from sylvia in scheduling stating pt called office c/o CP. Call transferred to triage nurse. Spoke with pt who was last evaluated in the office on 10/13. She states that she went for a walk this morning and during her walk she developed chest discomfort similar to chest discomfort that she has had in the past and had discussed during her last OV. She was unable to finish her walk. Pt currently denies CP and states she only develops CP during activity so no chest discomfort while speaking with triage nurse. Pt scheduled for a virtual visit with Jory Sims, DNP today at 9:45am. Pt was started on Norvasc 2.5mg . Recent TSH, BMP, CBC labs normal except Mg slightly elevated. Heart monitor ordered during pt last appt.

## 2019-06-21 NOTE — H&P (Addendum)
Virtual Visit via Video Note   This visit type was conducted due to national recommendations for restrictions regarding the COVID-19 Pandemic (e.g. social distancing) in an effort to limit this patient's exposure and mitigate transmission in our community.  Due to her co-morbid illnesses, this patient is at least at moderate risk for complications without adequate follow up.  This format is felt to be most appropriate for this patient at this time.  All issues noted in this document were discussed and addressed.  A limited physical exam was performed with this format.  Please refer to the patient's chart for her consent to telehealth for [facility].   Date:  06/21/2019   ID:  Rachel Valentine, DOB 07/22/1941, MRN [id number]  Patient Location: Home Provider Location: Home  PCP:  [name], MD  Cardiologist:  Quay Burow, MD  Electrophysiologist:  None   Evaluation Performed:  Follow-Up Visit  Chief Complaint:  Recurrent Chest Pain  History of Present Illness:    Rachel Valentine is a 78 y.o. female we are following for ongoing assessment and management of unstable angina, hypertension, coronary artery disease with most recent cardiac catheterization on 01/04/2019 revealing normal LV function, 90% circumflex lesion with high-grade diffuse calcified RCA stenosis with normal LAD.  She underwent LAD intervention by Dr. Ellyn Hack receiving a mid circumflex drug-eluting stent x1.  Afterwards she had an atherectomy and drug-eluting stent x2 to her mid and distal RCA on 01/06/2019 by Dr. Angelena Form.  She was continued on dual antiplatelet therapy with aspirin and Plavix, and Lipitor 80 mg.  She was last seen in the office by Coletta Memos 06/15/2019 on follow-up at which time she complained of intermittent chest discomfort over the last several days along with palpitations.  She took nitroglycerin on 4 separate occasions to relieve her chest pain.  She explained that the chest  discomfort was not similar to the pain she had prior to her stent placement.  She had been taking Tums and Prilosec that helped relieve her symptoms.  Amlodipine was added to her medication regimen.  Also Protonix 40 mg was added to take as needed for GI symptoms.  [name] called our office this morning stating that she was having worsening discomfort in her chest similar to the chest discomfort she had in the past prior during last office visit.  She was unable to finish her walk due to the discomfort.  She endorsed exertional chest pain, relieved with rest.  She did have a cardiac monitor ordered in the past office visit which has not resulted yet.  I am seeing the patient via video telemedicine this morning.  She is becoming concerned about her recurrent chest pain as her symptoms have worsened.  She is having more fatigue associated with the chest discomfort.  Some low-grade nausea.  She states she normally walks 3 miles a day, and now could not walk more than 10 minutes without having chest pain.  She denies any associated dizziness or diaphoresis.  She states that the pain is radiating into her neck and throat.  She states that the discomfort is very similar to the discomfort she had in her chest prior to interventions.  With worsening intensity over the last 2 weeks.  The patient does not have symptoms concerning for COVID-19 infection (fever, chills, cough, or new shortness of breath).    Past Medical History:  Diagnosis Date  . Bowel obstruction [facility])    Past Surgical History:  Procedure Laterality Date  .  ABDOMINAL HYSTERECTOMY    . ABDOMINAL SURGERY    . APPENDECTOMY    . AUGMENTATION MAMMAPLASTY Bilateral    implants removed in 2005  . BREAST EXCISIONAL BIOPSY Left    benign  . CORONARY ATHERECTOMY N/A 01/05/2019   Procedure: CORONARY ATHERECTOMY - STENT;  Surgeon: Burnell Blanks, MD;  Location: [place] CV [facility];  Service: Cardiovascular;  Laterality: N/A;  .  CORONARY STENT INTERVENTION N/A 01/04/2019   Procedure: CORONARY STENT INTERVENTION;  Surgeon: Leonie Man, MD;  Location: [place] [facility];  Service: Cardiovascular;  Laterality: N/A;  . LEFT HEART CATH AND CORONARY ANGIOGRAPHY N/A 01/04/2019   Procedure: LEFT HEART CATH AND CORONARY ANGIOGRAPHY;  Surgeon: Leonie Man, MD;  Location: [place] [facility];  Service: Cardiovascular;  Laterality: N/A;  . TEMPORARY PACEMAKER N/A 01/05/2019   Procedure: TEMPORARY PACEMAKER;  Surgeon: Burnell Blanks, MD;  Location: [place] [facility];  Service: Cardiovascular;  Laterality: N/A;     Current Meds  Medication Sig  . amLODipine (NORVASC) 2.5 MG tablet Take 1 tablet (2.5 mg total) by mouth at bedtime.  Marland Kitchen aspirin EC 81 MG tablet Take 1 tablet (81 mg total) by mouth daily.  . Biotin 10000 MCG TABS Take by mouth daily.  . Calcium-Phosphorus-Vitamin D (CITRACAL +D3 PO) Take by mouth daily.  . clopidogrel (PLAVIX) 75 MG tablet Take 1 tablet (75 mg total) by mouth daily with breakfast.  . diphenhydrAMINE (BENADRYL) 25 mg capsule Take 25 mg by mouth at bedtime as needed.  Marland Kitchen ipratropium (ATROVENT) 0.03 % nasal spray Place 2 sprays into both nostrils every 12 (twelve) hours.  . metoprolol tartrate (LOPRESSOR) 25 MG tablet Take 0.5 tablets (12.5 mg total) by mouth 2 (two) times daily.  . nitroGLYCERIN (NITROSTAT) 0.4 MG SL tablet Place 1 tablet (0.4 mg total) under the tongue every 5 (five) minutes as needed for chest pain.  . pantoprazole (PROTONIX) 40 MG tablet Take 1 tablet (40 mg total) by mouth daily.  . rosuvastatin (CRESTOR) 40 MG tablet Take 1 tablet by mouth daily.     Allergies:   Prednisone and Penicillins   Social History   Tobacco Use  . Smoking status: Former Smoker    Quit date: 01/06/1999    Years since quitting: 20.4  . Smokeless tobacco: Never Used  Substance Use Topics  . Alcohol use: No  . Drug use: Not on file     Family Hx: The patient's family history includes  Heart failure in her mother; Stroke in her brother and father.  ROS:   Please see the history of present illness.    All other systems reviewed and are negative.   Prior CV studies:   The following studies were reviewed today: Echocardiogram 01/02/2019 IMPRESSIONS   1. The left ventricle has hyperdynamic systolic function, with an ejection fraction of >65%. The cavity size was normal. Left ventricular diastolic Doppler parameters are indeterminate. 2. The right ventricle has normal systolic function. The cavity was normal. There is no increase in right ventricular wall thickness. 3. The mitral valve is grossly normal. There is mild mitral annular calcification present. 4. The aortic valve is tricuspid. Mild thickening of the aortic valve. Mild calcification of the aortic valve. No stenosis of the aortic valve. 5. The aortic root is normal in size and structure. 6. The interatrial septum was not assessed.  EKG:  EKG is ordered today.  The ekg ordered today demonstrates normal sinus rhythm 62 bpm   EKG  01/29/2019 Normal sinus rhythm 71 bpm  Cardiac catheterization 01/04/2019 SUMMARY  Severe two-vessel CAD involving a focal 90% mid circumflex followed by 80% distal circumflex as well as diffuse calcified 99%, 80% and 90% stenoses in the proximal, mid, distal RCA.  Successful DES PCI of mid circumflex 90% lesion using resolute DES 2.5 mm x 12 mm (2.8 mm), and PTCA only of the distal circumflex reducing 80% stenosis to 30% using 1.5 mm balloon.  Preserved LVEF with normal LVEDP.  Systemic hypertension  RECOMMENDATIONS  The patient has severe residual RCA disease that is extremely calcified and will require atherectomy. She will be tentatively staged for orbital atherectomy based DES PCI of the RCA with Dr. Angelena Form tomorrow afternoon following hydration. tic Dominance: Right  Intervention     Cardiac catheterization 01/05/2019  Prox RCA lesion is 50% stenosed.   Prox RCA to Mid RCA lesion is 99% stenosed.  Mid RCA to Dist RCA lesion is 90% stenosed.  A drug-eluting stent was successfully placed using a STENT SYNERGY DES 2.5X38.  Post intervention, there is a 0% residual stenosis.  A drug-eluting stent was successfully placed using a STENT SYNERGY DES 2.75X16.  Post intervention, there is a 0% residual stenosis.  1. Successful orbital atherectomy/PTCA/DES x 2 mid and distal RCA  Continue DAPT with ASA/Plavix for one year. Continue statin and beta blocker  Coronary Diagrams  Diagnostic Dominance: Right  Intervention     Labs/Other Tests and Data Reviewed:    EKG:  No ECG reviewed.  Recent Labs: 01/01/2019: B Natriuretic Peptide 28.5 06/15/2019: BUN 18; Creatinine, Ser 0.71; Hemoglobin 13.8; Magnesium 2.4; Platelets 321; Potassium 4.7; Sodium 139; TSH 0.947   Recent Lipid Panel Lab Results  Component Value Date/Time   CHOL 178 01/02/2019 02:16 AM   TRIG 70 01/02/2019 02:16 AM   HDL 79 01/02/2019 02:16 AM   CHOLHDL 2.3 01/02/2019 02:16 AM   LDLCALC 85 01/02/2019 02:16 AM    Wt Readings from Last 3 Encounters:  06/21/19 118 lb (53.5 kg)  06/15/19 118 lb 12.8 oz (53.9 kg)  05/12/19 119 lb 3.2 oz (54.1 kg)     Objective:    Vital Signs:  BP 128/66   Pulse 75   Ht 5\' 2"  (1.575 m)   Wt 118 lb (53.5 kg)   BMI 21.58 kg/m    VITAL SIGNS:  reviewed GEN:   EYES:  sclerae anicteric, EOMI - Extraocular Movements Intact RESPIRATORY:  normal respiratory effort, symmetric expansion MUSCULOSKELETAL:  no obvious deformities. NEURO:  alert and oriented x 3, no obvious focal deficit PSYCH:  normal affect  ASSESSMENT & PLAN:    1.  Coronary artery disease: History of cardiac catheterization 01/04/2019 revealing a 90% circumflex lesion with high-grade diffuse calcified RCA stenosis and normal LAD.  She has drug-eluting stent x1 to the mid circumflex, atherectomy and DES x2 to her mid and distal RCA on 01/06/2019.  She remains  medically compliant with dual antiplatelet therapy to include Plavix, aspirin, along with high-dose statin.  Her symptoms are concerning as she is having recurrent chest pain which has been progressive and more intense over the last 2 weeks.  Initially it was thought to be related to GERD symptoms but it is now stopping her from being able to take her walks in the morning.  She normally walks 3 miles a day.  When she began to walk this morning she was unable to walk further than approximately 10 minutes without recurrent chest pain radiating up into her  throat and neck with associated fatigue.  She states that taking nitroglycerin is helpful, but her fatigue has not improved.  I am reluctant to start her on nitrates unless we reevaluate her coronary anatomy due to multiple stents.  She may benefit from a relook catheterization to evaluate her RCA and circumflex again and for new areas which may need to have intervention.  I have contacted Dr. Gwenlyn Found to discuss this.  Dr. Alvester Chou has reviewed her chart and is in agreement with repeating her catheterization.  She will be scheduled for Thursday, June 24, 2019 with him.  The patient understands that risks include but are not limited to stroke (1 in 1000), death (1 in 58), kidney failure [usually temporary] (1 in 500), bleeding (1 in 200), allergic reaction [possibly serious] (1 in 200), and agrees to proceed.    2.  Hyperlipidemia: She remains on high-dose statin Lipitor 80 mg daily.  Goal of LDL less than 70.  Most recent labs were completed on 01/02/2019 with an LDL of 85.  She will need to have some fasting follow-up labs after procedure.  3.  Hypertension: Blood pressures currently well controlled with addition of amlodipine 2.5 mg.  Amlodipine was also ordered for angina symptoms.  She remains on metoprolol 12.5 mg twice daily.  4.  History of GERD: Remains on pantoprazole 40 mg daily.  No improvement in symptoms.  COVID-19 Education: The signs and  symptoms of COVID-19 were discussed with the patient and how to seek care for testing (follow up with PCP or arrange E-visit).  The importance of social distancing was discussed today.  Time:   Today, I have spent 25 minutes with the patient with telehealth technology discussing the above problems.     Medication Adjustments/Labs and Tests Ordered: Current medicines are reviewed at length with the patient today.  Concerns regarding medicines are outlined above.   Tests Ordered: No orders of the defined types were placed in this encounter.   Medication Changes: No orders of the defined types were placed in this encounter.   Disposition:  Follow up post left heart catheterization.  Signed, Phill Myron. [place] Pugh, ANP, AACC  06/21/2019 10:44 AM    [facility]  Agree with note by Beckie Busing, NP  Patient well-known to me with prior history of stenting now with accelerated angina.  She will be set up for diagnostic coronary angiography later this week.  [name], M.D., [place], [facility], [place] [address]. [place], [place]  [address]  [phone] 06/21/2019 11:34 AM

## 2019-06-21 NOTE — Patient Instructions (Addendum)
Medication Instructions:  Continue current medications  If you need a refill on your cardiac medications before your next appointment, please call your pharmacy.  Labwork: CBC and BMP [place] HERE IN OUR OFFICE AT [facility]   You will NOT need to fast   If you have labs (blood work) drawn today and your tests are completely normal, you will receive your results only by:  [place] (if you have MyChart) OR  A paper copy in the mail If you have any lab test that is abnormal or we need to change your treatment, we will call you to review the results.  Testing/Procedures: Your physician has requested that you have a cardiac catheterization. Cardiac catheterization is used to diagnose and/or treat various heart conditions. Doctors may recommend this procedure for a number of different reasons. The most common reason is to evaluate chest pain. Chest pain can be a symptom of coronary artery disease (CAD), and cardiac catheterization can show whether plaque is narrowing or blocking your hearts arteries. This procedure is also used to evaluate the valves, as well as measure the blood flow and oxygen levels in different parts of your heart. For further information please visit [email or web address]. Please follow instruction sheet, as given.  Special Instructions:    [place] [address] Dept: [phone] Loc: [place]  06/21/2019  You are scheduled for a Cardiac Catheterization on Thursday, October 22 with Dr. Quay Burow.  1. Please arrive at the [facility] (Main Entrance A) at [facility]: [address] [place], [place] [address] at 11:00 AM (This time is two hours before your procedure to ensure your preparation). Free valet parking service is available.   Special note: Every effort is made to have your procedure done on time. Please  understand that emergencies sometimes delay scheduled procedures.  2. Diet: Do not eat solid foods after midnight.  The patient may have clear liquids until 5am upon the day of the procedure.  3. Labs: You will need to have blood drawn on Monday, October 19 at [facility]  Open: 8am - 5pm (Lunch 12:30 - 1:30)   Phone: [phone]. You do not need to be fasting.  [id number] test: This is a Drive Up Visit at the [employer] [address] Someone will direct you to the appropriate testing line. Stay in your car and someone will be with you shortly.  4. Medication instructions in preparation for your procedure:   Contrast Allergy: No  On the morning of your procedure, take your Aspirin and Plavix/Clopidogrel and any morning medicines NOT listed above.  You may use sips of water.  5. Plan for one night stay--bring personal belongings. 6. Bring a current list of your medications and current insurance cards. 7. You MUST have a responsible person to drive you home. 8. Someone MUST be with you the first 24 hours after you arrive home or your discharge will be delayed. 9. Please wear clothes that are easy to get on and off and wear slip-on shoes.  Thank you for allowing [place] to care for you!   -- [facility]   Follow-Up: Your physician recommends that you schedule a follow-up appointment in: 2 weeks after Cath    At [facility], you and your health needs are our priority.  As part of our continuing mission to provide you  with exceptional heart care, we have created designated Provider Care Teams.  These Care Teams include your primary Cardiologist (physician) and Advanced Practice Providers (APPs -  Physician Assistants and Nurse Practitioners) who all work together to provide you with the care you need, when you need it.  Thank you for choosing [facility] at [facility]!!

## 2019-06-22 LAB — NOVEL CORONAVIRUS, NAA (HOSP ORDER, SEND-OUT TO REF LAB; TAT 18-24 HRS): SARS-CoV-2, NAA: NOT DETECTED

## 2019-06-22 LAB — CBC
Hematocrit: 40.4 % (ref 34.0–46.6)
Hemoglobin: 13.9 g/dL (ref 11.1–15.9)
MCH: 30.8 pg (ref 26.6–33.0)
MCHC: 34.4 g/dL (ref 31.5–35.7)
MCV: 89 fL (ref 79–97)
Platelets: 343 10*3/uL (ref 150–450)
RBC: 4.52 x10E6/uL (ref 3.77–5.28)
RDW: 12.9 % (ref 11.7–15.4)
WBC: 8.8 10*3/uL (ref 3.4–10.8)

## 2019-06-22 LAB — BASIC METABOLIC PANEL
BUN/Creatinine Ratio: 19 (ref 12–28)
BUN: 15 mg/dL (ref 8–27)
CO2: 26 mmol/L (ref 20–29)
Calcium: 10.3 mg/dL (ref 8.7–10.3)
Chloride: 103 mmol/L (ref 96–106)
Creatinine, Ser: 0.79 mg/dL (ref 0.57–1.00)
GFR calc Af Amer: 84 mL/min/{1.73_m2} (ref >59)
GFR calc non Af Amer: 72 mL/min/{1.73_m2} (ref >59)
Glucose: 102 mg/dL — ABNORMAL HIGH (ref 65–99)
Potassium: 4.7 mmol/L (ref 3.5–5.2)
Sodium: 143 mmol/L (ref 134–144)

## 2019-06-23 ENCOUNTER — Telehealth: Payer: Self-pay | Admitting: *Deleted

## 2019-06-23 NOTE — Telephone Encounter (Signed)
Pt contacted pre-catheterization scheduled at [facility] for: Thursday June 24, 2019 1 PM Verified arrival time and place: [place] [facility]) at: 11 AM   No solid food after midnight prior to cath, clear liquids until 5 AM day of procedure. Contrast allergy: no  AM meds can be  taken pre-cath with sip of water including: ASA 81 mg Plavix 75 mg  Confirmed patient has responsible adult to drive home post procedure and observe 24 hours after arriving home: yes  Currently, due to Covid-19 pandemic, only one support person will be allowed with patient. Must be the same support person for that patient's entire stay, will be screened and required to wear a mask. They will be asked to wait in the waiting room for the duration of the patient's stay.  Patients are required to wear a mask when they enter the hospital.      COVID-19 Pre-Screening Questions:  . In the past 7 to 10 days have you had a cough,  shortness of breath, headache, congestion, fever (100 or greater) body aches, chills, sore throat, or sudden loss of taste or sense of smell? no . Have you been around anyone with known Covid 19? no . Have you been around anyone who is awaiting Covid 19 test results in the past 7 to 10 days? no . Have you been around anyone who has been exposed to Covid 19, or has mentioned symptoms of Covid 19 within the past 7 to 10 days? no   I reviewed procedure/mask/visitor instructions, Covid-19 screening questions with patient, she verbalized understanding, thanked me for call.

## 2019-06-24 ENCOUNTER — Other Ambulatory Visit: Payer: Self-pay

## 2019-06-24 ENCOUNTER — Inpatient Hospital Stay (HOSPITAL_COMMUNITY)
Admission: AD | Admit: 2019-06-24 | Discharge: 2019-07-02 | DRG: 234 | Disposition: A | Discharge status: Home / Self Care | Payer: Medicare HMO | Attending: Surgery | Admitting: Surgery

## 2019-06-24 ENCOUNTER — Encounter (HOSPITAL_COMMUNITY)
Admission: AD | Disposition: A | Discharge status: Home / Self Care | Payer: Self-pay | Source: Home / Self Care | Attending: Surgery

## 2019-06-24 ENCOUNTER — Other Ambulatory Visit: Payer: Self-pay | Admitting: *Deleted

## 2019-06-24 DIAGNOSIS — Z7902 Long term (current) use of antithrombotics/antiplatelets: Secondary | ICD-10-CM | POA: Diagnosis not present

## 2019-06-24 DIAGNOSIS — Z880 Allergy status to penicillin: Secondary | ICD-10-CM | POA: Diagnosis not present

## 2019-06-24 DIAGNOSIS — Z01818 Encounter for other preprocedural examination: Secondary | ICD-10-CM | POA: Diagnosis not present

## 2019-06-24 DIAGNOSIS — Z888 Allergy status to other drugs, medicaments and biological substances status: Secondary | ICD-10-CM

## 2019-06-24 DIAGNOSIS — I088 Other rheumatic multiple valve diseases: Secondary | ICD-10-CM | POA: Diagnosis not present

## 2019-06-24 DIAGNOSIS — Z7982 Long term (current) use of aspirin: Secondary | ICD-10-CM | POA: Diagnosis not present

## 2019-06-24 DIAGNOSIS — Z87891 Personal history of nicotine dependence: Secondary | ICD-10-CM

## 2019-06-24 DIAGNOSIS — Z01810 Encounter for preprocedural cardiovascular examination: Secondary | ICD-10-CM | POA: Diagnosis not present

## 2019-06-24 DIAGNOSIS — E782 Mixed hyperlipidemia: Secondary | ICD-10-CM | POA: Diagnosis not present

## 2019-06-24 DIAGNOSIS — E785 Hyperlipidemia, unspecified: Secondary | ICD-10-CM | POA: Diagnosis present

## 2019-06-24 DIAGNOSIS — I2584 Coronary atherosclerosis due to calcified coronary lesion: Secondary | ICD-10-CM | POA: Diagnosis present

## 2019-06-24 DIAGNOSIS — Z20828 Contact with and (suspected) exposure to other viral communicable diseases: Secondary | ICD-10-CM | POA: Diagnosis present

## 2019-06-24 DIAGNOSIS — D62 Acute posthemorrhagic anemia: Secondary | ICD-10-CM | POA: Diagnosis not present

## 2019-06-24 DIAGNOSIS — Z951 Presence of aortocoronary bypass graft: Secondary | ICD-10-CM

## 2019-06-24 DIAGNOSIS — I25110 Atherosclerotic heart disease of native coronary artery with unstable angina pectoris: Principal | ICD-10-CM

## 2019-06-24 DIAGNOSIS — Z955 Presence of coronary angioplasty implant and graft: Secondary | ICD-10-CM

## 2019-06-24 DIAGNOSIS — I2510 Atherosclerotic heart disease of native coronary artery without angina pectoris: Secondary | ICD-10-CM | POA: Diagnosis present

## 2019-06-24 DIAGNOSIS — J9811 Atelectasis: Secondary | ICD-10-CM | POA: Diagnosis not present

## 2019-06-24 DIAGNOSIS — Z8249 Family history of ischemic heart disease and other diseases of the circulatory system: Secondary | ICD-10-CM

## 2019-06-24 DIAGNOSIS — T82855A Stenosis of coronary artery stent, initial encounter: Secondary | ICD-10-CM | POA: Diagnosis present

## 2019-06-24 DIAGNOSIS — Z79899 Other long term (current) drug therapy: Secondary | ICD-10-CM

## 2019-06-24 DIAGNOSIS — Z9861 Coronary angioplasty status: Secondary | ICD-10-CM | POA: Diagnosis not present

## 2019-06-24 DIAGNOSIS — Z01811 Encounter for preprocedural respiratory examination: Secondary | ICD-10-CM

## 2019-06-24 DIAGNOSIS — I10 Essential (primary) hypertension: Secondary | ICD-10-CM | POA: Diagnosis not present

## 2019-06-24 DIAGNOSIS — R079 Chest pain, unspecified: Secondary | ICD-10-CM

## 2019-06-24 DIAGNOSIS — I25119 Atherosclerotic heart disease of native coronary artery with unspecified angina pectoris: Secondary | ICD-10-CM | POA: Diagnosis not present

## 2019-06-24 HISTORY — PX: LEFT HEART CATH AND CORONARY ANGIOGRAPHY: CATH118249

## 2019-06-24 LAB — POCT ACTIVATED CLOTTING TIME: Activated Clotting Time: 312 seconds

## 2019-06-24 SURGERY — LEFT HEART CATH AND CORONARY ANGIOGRAPHY
Anesthesia: LOCAL

## 2019-06-24 MED ORDER — SODIUM CHLORIDE 0.9% FLUSH
3.0000 mL | Freq: Two times a day (BID) | INTRAVENOUS | Status: DC
  Administered 2019-06-27: 3 mL via INTRAVENOUS

## 2019-06-24 MED ORDER — MORPHINE SULFATE (PF) 2 MG/ML IV SOLN: 2.0000 mg | INTRAVENOUS | Status: DC | PRN

## 2019-06-24 MED ORDER — ATORVASTATIN CALCIUM 80 MG PO TABS: 80.0000 mg | ORAL_TABLET | Freq: Every day | ORAL | Status: DC

## 2019-06-24 MED ORDER — NITROGLYCERIN 0.4 MG SL SUBL: 0.4000 mg | SUBLINGUAL_TABLET | SUBLINGUAL | Status: DC | PRN

## 2019-06-24 MED ORDER — HEPARIN (PORCINE) IN NACL 1000-0.9 UT/500ML-% IV SOLN
INTRAVENOUS | Status: AC
  Filled 2019-06-24: qty 1000

## 2019-06-24 MED ORDER — SODIUM CHLORIDE 0.9% FLUSH: 3.0000 mL | INTRAVENOUS | Status: DC | PRN

## 2019-06-24 MED ORDER — HEPARIN (PORCINE) 25000 UT/250ML-% IV SOLN: 650.0000 [IU]/h | INTRAVENOUS | Status: DC

## 2019-06-24 MED ORDER — HEPARIN SODIUM (PORCINE) 1000 UNIT/ML IJ SOLN
INTRAMUSCULAR | Status: AC
  Filled 2019-06-24: qty 1

## 2019-06-24 MED ORDER — METOPROLOL TARTRATE 12.5 MG HALF TABLET
12.5000 mg | ORAL_TABLET | Freq: Two times a day (BID) | ORAL | Status: DC
  Administered 2019-06-24 – 2019-06-27 (×7): 12.5 mg via ORAL
  Filled 2019-06-24 (×7): qty 1

## 2019-06-24 MED ORDER — SODIUM CHLORIDE 0.9 % IV SOLN: 250.0000 mL | INTRAVENOUS | Status: DC | PRN

## 2019-06-24 MED ORDER — HYDRALAZINE HCL 20 MG/ML IJ SOLN: 10.0000 mg | INTRAMUSCULAR | Status: AC | PRN

## 2019-06-24 MED ORDER — SODIUM CHLORIDE 0.9 % WEIGHT BASED INFUSION: 1.0000 mL/kg/h | INTRAVENOUS | Status: DC

## 2019-06-24 MED ORDER — HEPARIN (PORCINE) IN NACL 1000-0.9 UT/500ML-% IV SOLN
INTRAVENOUS | Status: DC | PRN
  Administered 2019-06-24 (×2): 500 mL

## 2019-06-24 MED ORDER — SODIUM CHLORIDE 0.9 % WEIGHT BASED INFUSION
3.0000 mL/kg/h | INTRAVENOUS | Status: DC
  Administered 2019-06-24: 3 mL/kg/h via INTRAVENOUS

## 2019-06-24 MED ORDER — MIDAZOLAM HCL 2 MG/2ML IJ SOLN
INTRAMUSCULAR | Status: AC
  Filled 2019-06-24: qty 2

## 2019-06-24 MED ORDER — ONDANSETRON HCL 4 MG/2ML IJ SOLN
4.0000 mg | Freq: Four times a day (QID) | INTRAMUSCULAR | Status: DC | PRN
  Administered 2019-06-28: 4 mg via INTRAVENOUS

## 2019-06-24 MED ORDER — ASPIRIN EC 81 MG PO TBEC
81.0000 mg | DELAYED_RELEASE_TABLET | Freq: Every day | ORAL | Status: DC
  Administered 2019-06-25 – 2019-06-27 (×3): 81 mg via ORAL
  Filled 2019-06-24 (×3): qty 1

## 2019-06-24 MED ORDER — MIDAZOLAM HCL 2 MG/2ML IJ SOLN
INTRAMUSCULAR | Status: DC | PRN
  Administered 2019-06-24: 1 mg via INTRAVENOUS

## 2019-06-24 MED ORDER — FENTANYL CITRATE (PF) 100 MCG/2ML IJ SOLN
INTRAMUSCULAR | Status: DC | PRN
  Administered 2019-06-24: 25 ug via INTRAVENOUS

## 2019-06-24 MED ORDER — PANTOPRAZOLE SODIUM 40 MG PO TBEC
40.0000 mg | DELAYED_RELEASE_TABLET | Freq: Every day | ORAL | Status: DC
  Administered 2019-06-24 – 2019-06-27 (×4): 40 mg via ORAL
  Filled 2019-06-24 (×4): qty 1

## 2019-06-24 MED ORDER — VERAPAMIL HCL 2.5 MG/ML IV SOLN
INTRAVENOUS | Status: AC
  Filled 2019-06-24: qty 2

## 2019-06-24 MED ORDER — FENTANYL CITRATE (PF) 100 MCG/2ML IJ SOLN
INTRAMUSCULAR | Status: AC
  Filled 2019-06-24: qty 2

## 2019-06-24 MED ORDER — SODIUM CHLORIDE 0.9% FLUSH: 3.0000 mL | Freq: Two times a day (BID) | INTRAVENOUS | Status: DC

## 2019-06-24 MED ORDER — ACETAMINOPHEN 325 MG PO TABS
650.0000 mg | ORAL_TABLET | ORAL | Status: DC | PRN
  Administered 2019-06-24 – 2019-06-27 (×3): 650 mg via ORAL
  Filled 2019-06-24 (×3): qty 2

## 2019-06-24 MED ORDER — AMLODIPINE BESYLATE 2.5 MG PO TABS
2.5000 mg | ORAL_TABLET | Freq: Every day | ORAL | Status: DC
  Administered 2019-06-24 – 2019-06-27 (×4): 2.5 mg via ORAL
  Filled 2019-06-24 (×4): qty 1

## 2019-06-24 MED ORDER — LIDOCAINE HCL (PF) 1 % IJ SOLN
INTRAMUSCULAR | Status: DC | PRN
  Administered 2019-06-24: 2 mL

## 2019-06-24 MED ORDER — LIDOCAINE HCL (PF) 1 % IJ SOLN
INTRAMUSCULAR | Status: AC
  Filled 2019-06-24: qty 30

## 2019-06-24 MED ORDER — SODIUM CHLORIDE 0.9 % IV SOLN
INTRAVENOUS | Status: AC
  Administered 2019-06-24 (×2): via INTRAVENOUS

## 2019-06-24 MED ORDER — IOHEXOL 350 MG/ML SOLN
INTRAVENOUS | Status: DC | PRN
  Administered 2019-06-24: 80 mL

## 2019-06-24 MED ORDER — HEPARIN (PORCINE) 25000 UT/250ML-% IV SOLN
750.0000 [IU]/h | INTRAVENOUS | Status: DC
  Administered 2019-06-25: 650 [IU]/h via INTRAVENOUS
  Administered 2019-06-26 – 2019-06-27 (×2): 750 [IU]/h via INTRAVENOUS
  Filled 2019-06-24 (×3): qty 250

## 2019-06-24 MED ORDER — ASPIRIN 81 MG PO CHEW: 81.0000 mg | CHEWABLE_TABLET | Freq: Every day | ORAL | Status: DC

## 2019-06-24 MED ORDER — VERAPAMIL HCL 2.5 MG/ML IV SOLN
INTRA_ARTERIAL | Status: DC | PRN
  Administered 2019-06-24 (×2): 10 mL via INTRA_ARTERIAL

## 2019-06-24 MED ORDER — NITROGLYCERIN 1 MG/10 ML FOR IR/CATH LAB
INTRA_ARTERIAL | Status: AC
  Filled 2019-06-24: qty 10

## 2019-06-24 MED ORDER — ROSUVASTATIN CALCIUM 20 MG PO TABS
40.0000 mg | ORAL_TABLET | Freq: Every day | ORAL | Status: DC
  Administered 2019-06-25 – 2019-07-02 (×7): 40 mg via ORAL
  Filled 2019-06-24 (×2): qty 2
  Filled 2019-06-24: qty 8
  Filled 2019-06-24 (×4): qty 2

## 2019-06-24 MED ORDER — LABETALOL HCL 5 MG/ML IV SOLN: 10.0000 mg | INTRAVENOUS | Status: AC | PRN

## 2019-06-24 MED ORDER — HEPARIN SODIUM (PORCINE) 1000 UNIT/ML IJ SOLN
INTRAMUSCULAR | Status: DC | PRN
  Administered 2019-06-24 (×2): 3000 [IU] via INTRAVENOUS

## 2019-06-24 SURGICAL SUPPLY — 19 items
BALLN SAPPHIRE 1.5X12 (BALLOONS) ×2
BALLN SAPPHIRE 2.0X12 (BALLOONS) ×2
BALLOON SAPPHIRE 1.5X12 (BALLOONS) IMPLANT
BALLOON SAPPHIRE 2.0X12 (BALLOONS) IMPLANT
CATH INFINITI 5FR ANG PIGTAIL (CATHETERS) ×1 IMPLANT
CATH LAUNCHER 6FR AL.75 (CATHETERS) ×1 IMPLANT
CATH OPTITORQUE TIG 4.0 5F (CATHETERS) ×1 IMPLANT
DEVICE RAD TR BAND REGULAR (VASCULAR PRODUCTS) ×1 IMPLANT
GLIDESHEATH SLEND A-KIT 6F 22G (SHEATH) ×1 IMPLANT
GUIDEWIRE INQWIRE 1.5J.035X260 (WIRE) IMPLANT
INQWIRE 1.5J .035X260CM (WIRE) ×2
KIT HEART LEFT (KITS) ×2 IMPLANT
PACK CARDIAC CATHETERIZATION (CUSTOM PROCEDURE TRAY) ×2 IMPLANT
TRANSDUCER W/STOPCOCK (MISCELLANEOUS) ×2 IMPLANT
TUBING CIL FLEX 10 FLL-RA (TUBING) ×2 IMPLANT
WIRE ASAHI FIELDER XT 190CM (WIRE) ×1 IMPLANT
WIRE ASAHI PROWATER 180CM (WIRE) ×1 IMPLANT
WIRE HI TORQ VERSACORE-J 145CM (WIRE) ×1 IMPLANT
WIRE PT2 MS 185 (WIRE) ×1 IMPLANT

## 2019-06-24 NOTE — Progress Notes (Signed)
ANTICOAGULATION CONSULT NOTE - Initial Consult  Pharmacy Consult for heparin Indication: chest pain/ACS  Allergies  Allergen Reactions  . Fosamax [Alendronate] Nausea Only    Unset stomach  . Prednisone Other (See Comments)    Blood pressure high when she takes it  . Penicillins Rash    Did it involve swelling of the face/tongue/throat, SOB, or low BP? No Did it involve sudden or severe rash/hives, skin peeling, or any reaction on the inside of your mouth or nose? No Did you need to seek medical attention at a hospital or doctor's office? Yes When did it last happen?Childhood If all above answers are "NO", may proceed with cephalosporin use.    Patient Measurements: Height: 5\' 2"  (157.5 cm) Weight: 117 lb (53.1 kg) IBW/kg (Calculated) : 50.1 Heparin Dosing Weight: 53kg  Vital Signs: Temp: 97.7 F (36.5 C) (10/22 1104) Temp Source: Skin (10/22 1104) BP: 164/83 (10/22 1400) Pulse Rate: 80 (10/22 1400)  Labs: Recent Labs    06/21/19 1436  HGB 13.9  HCT 40.4  PLT 343  CREATININE 0.79    Estimated Creatinine Clearance: 46.6 mL/min (by C-G formula based on SCr of 0.79 mg/dL).   Medical History: Past Medical History:  Diagnosis Date  . Bowel obstruction [facility])      Assessment: 24 yoF admitted with relook cath found to have 99% RCA with inability to cross lesion. CABG consult ordered, clopidogrel held, pharmacy to restart IV heparin 4hr after sheath removal (1400). No AC PTA,   Goal of Therapy:  Heparin level 0.3-0.7 units/ml Monitor platelets by anticoagulation protocol: Yes   Plan:  -Restart heparin 650 units/hr no bolus at 1800 -Check heparin level in [place], PharmD, BCPS Clinical Pharmacist [phone] Please check AMION for all [place] numbers 06/24/2019

## 2019-06-24 NOTE — Progress Notes (Signed)
TR band deflated at 2135. Delayed d/t oozing. Continue to monitor BP and signs of bleeding. Currently no complications noted, pt tolerating well. R arm elevated on pillow.

## 2019-06-24 NOTE — Plan of Care (Signed)
  Problem: Education: Goal: Knowledge of General Education information will improve Description: Including pain rating scale, medication(s)/side effects and non-pharmacologic comfort measures Outcome: Progressing   Problem: Clinical Measurements: Goal: Ability to maintain clinical measurements within normal limits will improve Outcome: Progressing Goal: Will remain free from infection Outcome: Progressing Goal: Diagnostic test results will improve Outcome: Progressing Goal: Respiratory complications will improve Outcome: Progressing Goal: Cardiovascular complication will be avoided Outcome: Progressing   Problem: Activity: Goal: Risk for activity intolerance will decrease Outcome: Progressing   Problem: Nutrition: Goal: Adequate nutrition will be maintained Outcome: Progressing   Problem: Coping: Goal: Level of anxiety will decrease Outcome: Progressing   Problem: Coping: Goal: Level of anxiety will decrease Outcome: Progressing   Problem: Elimination: Goal: Will not experience complications related to bowel motility Outcome: Progressing Goal: Will not experience complications related to urinary retention Outcome: Progressing   Problem: Pain Managment: Goal: General experience of comfort will improve Outcome: Progressing   Problem: Safety: Goal: Ability to remain free from injury will improve Outcome: Progressing   Problem: Skin Integrity: Goal: Risk for impaired skin integrity will decrease Outcome: Progressing   Problem: Clinical Measurements: Goal: Postoperative complications will be avoided or minimized Outcome: Progressing   Problem: Skin Integrity: Goal: Demonstration of wound healing without infection will improve Outcome: Progressing

## 2019-06-24 NOTE — Interval H&P Note (Signed)
Cath Lab Visit (complete for each Cath Lab visit)  Clinical Evaluation Leading to the Procedure:   ACS: No.  Non-ACS:    Anginal Classification: CCS III  Anti-ischemic medical therapy: Maximal Therapy (2 or more classes of medications)  Non-Invasive Test Results: No non-invasive testing performed  Prior CABG: No previous CABG      History and Physical Interval Note:  06/24/2019 12:51 PM  Rachel Valentine  has presented today for surgery, with the diagnosis of angina - cad.  The various methods of treatment have been discussed with the patient and family. After consideration of risks, benefits and other options for treatment, the patient has consented to  Procedure(s): LEFT HEART CATH AND CORONARY ANGIOGRAPHY (N/A) as a surgical intervention.  The patient's history has been reviewed, patient examined, no change in status, stable for surgery.  I have reviewed the patient's chart and labs.  Questions were answered to the patient's satisfaction.     Quay Burow

## 2019-06-24 NOTE — Consult Note (Signed)
[address]       ,[place] [address]             [phone]      Cardiothoracic Surgery Consultation  Reason for Consult: High grade RCA restenosis Referring Physician: Dr. Quay Valentine  Rachel Valentine is an 78 y.o. female.  HPI:   The patient is a 78 year old woman with a history of hypertension and coronary artery disease status post cardiac catheterization in May 2020 showing a 90% left circumflex stenosis as well as high-grade diffuse calcific stenosis within the RCA.  The LAD was normal.  She had a drug-eluting stent placed in the mid left circumflex coronary artery and subsequently underwent Valentine and drug-eluting stent x2 to the mid and distal right coronary.  She was continued on dual antiplatelet therapy and Lipitor.  She did well until about 3 weeks ago when she began having the same substernal chest discomfort with exertion that she had prior to stent placement.  This was associated with nausea and fatigue.  She said that she has had few episodes at rest.  She was walking 3 miles per day until this started and now cannot walk more than 10 minutes without having chest pain.  She underwent cardiac catheterization today by Dr. Gwenlyn Found which showed diffuse calcific stenosis within the RCA with 99% restenosis within the proximal RCA.  There was also about 40% ostial left main narrowing.  There is about 40% stenosis in the left circumflex.  The previously placed stent in the left circumflex is widely patent.  The LAD has no stenosis.  Past Medical History:  Diagnosis Date  . Bowel obstruction [facility])     Past Surgical History:  Procedure Laterality Date  . ABDOMINAL HYSTERECTOMY    . ABDOMINAL SURGERY    . APPENDECTOMY    . AUGMENTATION MAMMAPLASTY Bilateral    implants removed in 2005  . BREAST EXCISIONAL BIOPSY Left    benign  . CORONARY Valentine N/A 01/05/2019   Procedure: CORONARY Valentine - STENT;  Surgeon: Burnell Blanks, Valentine;   Location: [facility];  Service: Cardiovascular;  Laterality: N/A;  . CORONARY STENT INTERVENTION N/A 01/04/2019   Procedure: CORONARY STENT INTERVENTION;  Surgeon: Leonie Man, Valentine;  Location: [place];  Service: Cardiovascular;  Laterality: N/A;  . LEFT HEART CATH AND CORONARY ANGIOGRAPHY N/A 01/04/2019   Procedure: LEFT HEART CATH AND CORONARY ANGIOGRAPHY;  Surgeon: Leonie Man, Valentine;  Location: [facility] [facility];  Service: Cardiovascular;  Laterality: N/A;  . TEMPORARY PACEMAKER N/A 01/05/2019   Procedure: TEMPORARY PACEMAKER;  Surgeon: Burnell Blanks, Valentine;  Location: [place] [facility];  Service: Cardiovascular;  Laterality: N/A;    Family History  Problem Relation Age of Onset  . Heart failure Mother   . Stroke Father   . Stroke Brother     Social History:  reports that she quit smoking about 20 years ago. She has never used smokeless tobacco. She reports that she does not drink alcohol. No history on file for drug.  Allergies:  Allergies  Allergen Reactions  . Fosamax [Alendronate] Nausea Only    Unset stomach  . Prednisone Other (See Comments)    Blood pressure high when she takes it  . Penicillins Rash    Did it involve swelling of the face/tongue/throat, SOB, or low BP? No Did it involve sudden or severe rash/hives, skin peeling, or any reaction on the inside of your  mouth or nose? No Did you need to seek medical attention at a Valentine or doctor's office? Yes When did it last happen?Childhood If all above answers are "NO", may proceed with cephalosporin use.    Medications:  I have reviewed the patient's current medications. Prior to Admission:  Medications Prior to Admission  Medication Sig Dispense Refill Last Dose  . amLODipine (NORVASC) 2.5 MG tablet Take 1 tablet (2.5 mg total) by mouth at bedtime. 30 tablet 6 06/23/2019 at Unknown time  . aspirin EC 81 MG tablet Take 1 tablet (81 mg total) by mouth daily. 150 tablet 0 06/24/2019  at 0730  . Biotin 10000 MCG TABS Take 10,000 mg by mouth daily.    06/23/2019 at Unknown time  . Calcium-Phosphorus-Vitamin D (CITRACAL +D3 PO) Take 1 tablet by mouth daily.    06/23/2019 at Unknown time  . clopidogrel (PLAVIX) 75 MG tablet Take 1 tablet (75 mg total) by mouth daily with breakfast. 120 tablet 0 06/24/2019 at 0730  . diphenhydrAMINE (BENADRYL) 25 mg capsule Take 25 mg by mouth at bedtime as needed for allergies.    06/23/2019 at Unknown time  . metoprolol tartrate (LOPRESSOR) 25 MG tablet Take 0.5 tablets (12.5 mg total) by mouth 2 (two) times daily. 60 tablet 0 06/24/2019 at 0730  . nitroGLYCERIN (NITROSTAT) 0.4 MG SL tablet Place 1 tablet (0.4 mg total) under the tongue every 5 (five) minutes as needed for chest pain. 30 tablet 0 Past Week at Unknown time  . pantoprazole (PROTONIX) 40 MG tablet Take 1 tablet (40 mg total) by mouth daily. (Patient taking differently: Take 40 mg by mouth at bedtime. ) 30 tablet 6 06/23/2019 at Unknown time  . Propylene Glycol (SYSTANE BALANCE) 0.6 % SOLN Place 2 drops into both eyes as needed (Dry eye).   Past Week at Unknown time  . rosuvastatin (CRESTOR) 40 MG tablet Take 40 mg by mouth daily.    06/24/2019 at 0730   Scheduled: . amLODipine  2.5 mg Oral QHS  . [START ON 06/25/2019] aspirin EC  81 mg Oral Daily  . metoprolol tartrate  12.5 mg Oral BID  . pantoprazole  40 mg Oral QHS  . [START ON 06/25/2019] rosuvastatin  40 mg Oral Daily  . sodium chloride flush  3 mL Intravenous Q12H   Continuous: . sodium chloride 75 mL/hr at 06/24/19 2050  . sodium chloride    . heparin     [id number] chloride, acetaminophen, morphine injection, nitroGLYCERIN, ondansetron (ZOFRAN) IV, sodium chloride flush Anti-infectives (From admission, onward)   None      Results for orders placed or performed during the Valentine encounter of 06/24/19 (from the past 48 hour(s))  POCT Activated clotting time     Status: None   Collection Time: 06/24/19  1:17 PM   Result Value Ref Range   Activated Clotting Time 312 seconds    No results found.  Review of Systems  Constitutional: Positive for diaphoresis and malaise/fatigue. Negative for chills and fever.  HENT: Negative.   Eyes: Negative.   Respiratory: Positive for shortness of breath.   Cardiovascular: Positive for chest pain. Negative for palpitations, orthopnea and leg swelling.  Gastrointestinal: Positive for nausea.  Genitourinary: Negative.   Musculoskeletal: Negative.   Skin: Negative.   Neurological: Negative for dizziness.  Endo/Heme/Allergies: Negative.   Psychiatric/Behavioral: Negative.    Blood pressure 125/70, pulse 72, temperature 98 F (36.7 C), temperature source Oral, resp. rate (!) 21, height 5\' 2"  (1.575 m), weight 53.1 kg,  SpO2 99 %. Physical Exam  Constitutional: She is oriented to person, place, and time. She appears well-developed and well-nourished. No distress.  HENT:  Head: Normocephalic and atraumatic.  Mouth/Throat: Oropharynx is clear and moist.  Eyes: Pupils are equal, round, and reactive to light. Conjunctivae and EOM are normal.  Neck: Normal range of motion. Neck supple. No JVD present.  Cardiovascular: Normal rate, regular rhythm, normal heart sounds and intact distal pulses.  No murmur heard. Respiratory: Effort normal and breath sounds normal. No respiratory distress.  GI: Soft. Bowel sounds are normal. She exhibits no distension. There is no abdominal tenderness.  Musculoskeletal: Normal range of motion.        General: No edema.  Neurological: She is alert and oriented to person, place, and time. She has normal strength. No cranial nerve deficit or sensory deficit.  Skin: Skin is warm and dry.  Psychiatric: She has a normal mood and affect.    Physicians  Panel Physicians Referring Physician Case Authorizing Physician  [name], Valentine (Primary)    Procedures  LEFT HEART CATH AND CORONARY ANGIOGRAPHY  Conclusion    Prox RCA  lesion is 99% stenosed.  Prox RCA to Mid RCA lesion is 50% stenosed.  Ost RCA to Prox RCA lesion is 70% stenosed.  Previously placed Prox Cx to Mid Cx stent (unknown type) is widely patent.  Ost Cx to Prox Cx lesion is 40% stenosed.  Mid Cx to Dist Cx lesion is 40% stenosed.  Ost LM to Mid LM lesion is 40% stenosed.   Rachel Valentine is a 78 y.o. female    [id number] LOCATION:  FACILITY: [facility]  PHYSICIAN: Rachel Valentine, M.D. 06/25/1941   DATE OF PROCEDURE:  06/24/2019  DATE OF DISCHARGE:     CARDIAC CATHETERIZATION / Attempted Rachel RCA    History obtained from chart review.Rachel Valentine is a 78 year old thin appearing Caucasian female status post mid AV groove circumflex tenting by Dr. Ellyn Hack in May followed by orbital Valentine, Rachel and stenting of a diffusely calcified and diseased RCA by Dr. Angelena Form the following day.  Her LAD is free of significant disease.  She has normal LV function.  She did well after that until recently when she is had accelerated angina which is nitro responsive.  She was seen virtually by Jory Sims, NP who arrange for her to undergo outpatient diagnostic coronary angiography today.   PROCEDURE DESCRIPTION:   The patient was brought to the second floor [facility] Cardiac cath lab in the postabsorptive state.  She was premedicated with IV Versed and fentanyl.  Her right wristwas prepped and shaved in usual sterile fashion. Xylocaine 1% was used for local anesthesia. A 6 French sheath was inserted into the right radial artery using standard Seldinger technique. The patient received 3000 units  of heparin intravenously.  A 5 [place] TIG catheter pigtail catheters were used for selective coronary angiography and obtain left heart pressures.  Omnipaque dye was used for the entirety of the case.  Retrograde aorta, ventricular and pullback pressures were recorded.  Radial cocktail was administered via the SideArm sheath.  The  patient received an additional 3000 units of heparin with an ACT in the 300 range.  On the pigtail she is for the entirety of the intervention.  Retroaortic pressures monitored to the case.  Patient was already on uninterrupted DAPT with aspirin and clopidogrel.  Using a 6 French AL 0.75 cm guide catheter along with a 0.14 prowater guidewire I attempted to cross  the 99% mid dominant RCA stenosis within the previously placed stent.  The [place] would not go.  I used a 2 mm balloon as backup and again would not cross the vessel.  I changed to a [employer] and it appeared that the lesion directed the wire outside of the stent into the subintimal space.  I was unable to pass a balloon down to the lesion.  After reviewing the case with Dr. Claiborne Billings the consensus was to terminate the procedure and get a T CTS consult.   IMPRESSION: Unsuccessful attempt at RCA Rachel and reintervention for aggressive in-stent restenosis in the setting of accelerated angina.  I was unable to cross the disease segment and I suspect the wire was outside of the stented segment in the subintimal space.  Because of this I elected to abort the procedure and have T CTS evaluate for CABG.  The sheath was removed and a TR band was placed on the right wrist to achieve patent hemostasis.  The patient left lab in stable condition.  Clopidogrel was discontinued.  I will rerestart heparin 4 hours after sheath removal.  Rachel Valentine, Rachel Valentine 06/24/2019 2:14 PM      Indications  Unstable angina ([place]) [I20.0 (ICD-10-CM)]  Procedural Details  Technical Details Estimated blood loss <50 mL.   During this procedure medications were administered to achieve and maintain moderate conscious sedation while the patient's heart rate, blood pressure, and oxygen saturation were continuously monitored and I was present face-to-face 100% of this time.  Medications (Filter: Administrations occurring from 06/24/19 1245 to 06/24/19 1423)  (important)  Continuous medications are totaled by the amount administered until 06/24/19 1423.  Medication Rate/Dose/Volume Action  Date Time   fentaNYL (SUBLIMAZE) injection (mcg) 25 mcg Given 06/24/19 1253   Total dose as of 06/24/19 1423        25 mcg        midazolam (VERSED) injection (mg) 1 mg Given 06/24/19 1253   Total dose as of 06/24/19 1423        1 mg        lidocaine (PF) (XYLOCAINE) 1 % injection (mL) 2 mL Given 06/24/19 1259   Total dose as of 06/24/19 1423        2 mL        Radial Cocktail (Verapamil 5 mg, NTG, Lidocaine) (mL) 10 mL Given 06/24/19 1300   Total dose as of 06/24/19 1423 10 mL Given 1317   20 mL        Heparin (Porcine) in NaCl 1000-0.9 UT/500ML-% SOLN (mL) 500 mL Given 06/24/19 1301   Total dose as of 06/24/19 1423 500 mL Given 1301   1,000 mL        heparin injection (Units) 3,000 Units Given 06/24/19 1303   Total dose as of 06/24/19 1423 3,000 Units Given 1313   6,000 Units        iohexol (OMNIPAQUE) 350 MG/ML injection (mL) 80 mL Given 06/24/19 1356   Total dose as of 06/24/19 1423        80 mL        Sedation Time  Sedation Time Physician-1: 1 hour 2 minutes 28 seconds  Contrast  Medication Name Total Dose  iohexol (OMNIPAQUE) 350 MG/ML injection 80 mL    Radiation/Fluoro  Fluoro time: 22.2 (min) DAP: [id number] (mGycm2) Cumulative Air Kerma: 455 (mGy)  Coronary Findings  Diagnostic Dominance: Right Left Main  Ost LM to Mid LM lesion 40% stenosed  Ost LM to  Mid LM lesion is 40% stenosed.  Left Circumflex  Ost Cx to Prox Cx lesion 40% stenosed  Ost Cx to Prox Cx lesion is 40% stenosed.  Prox Cx to Mid Cx lesion 0% stenosed  Previously placed Prox Cx to Mid Cx stent (unknown type) is widely patent. The lesion is located at the bend.  Mid Cx to Dist Cx lesion 40% stenosed  Mid Cx to Dist Cx lesion is 40% stenosed.  Right Coronary Artery  Ost RCA to Prox RCA lesion 70% stenosed  Ost RCA to Prox RCA lesion is 70% stenosed.  Prox RCA  lesion 99% stenosed  Prox RCA lesion is 99% stenosed. The lesion was previously treated.  Prox RCA to Mid RCA lesion 50% stenosed  Prox RCA to Mid RCA lesion is 50% stenosed. The lesion was previously treated.  Intervention  No interventions have been documented. Coronary Diagrams  Diagnostic Dominance: Right  Intervention  Implants   No implant documentation for this case.  Syngo Images  Show images for CARDIAC CATHETERIZATION  Images on Long Term Storage  Show images for [place], [employer] [place] to Procedure Log  Procedure Log    Hemo Data   Most Recent Value  AO Systolic Pressure [id number] mmHg  AO Diastolic Pressure 76 mmHg  AO Mean [id number]  LV Systolic Pressure 123456 mmHg  LV Diastolic Pressure 8 mmHg  LV EDP 18 mmHg  AOp Systolic Pressure 0000000 mmHg  AOp Diastolic Pressure 73 mmHg  AOp Mean Pressure [id number] mmHg  LVp Systolic Pressure [id number] mmHg  LVp Diastolic Pressure 8 mmHg  LVp EDP Pressure 18 mmHg     Assessment/Plan:  This 78 year old woman has high-grade restenosis within a diffusely diseased right coronary artery.  She had an unsuccessful attempt at Rachel today.  She is very symptomatic with minimal activity and has had some pain at rest while at home.  I agree that coronary artery bypass graft surgery is the best treatment.  She will only require a graft to the posterior descending artery.  She does have a diffusely diseased right coronary artery extending out into the posterior descending branch and I think we will have to have a graft placed to the more distal posterior descending branch.  This will require a saphenous vein graft because I do not think a right internal mammary graft will reach that far.  She has been on Plavix which was stopped today.  I will plan to do surgery on Monday. I discussed the operative procedure with the patient including alternatives, benefits and risks; including but not limited to bleeding, blood transfusion, infection, stroke,  myocardial infarction, graft failure, heart block requiring a permanent pacemaker, organ dysfunction, and death.  Seante Fard Basulto understands and agrees to proceed.    I spent 40 minutes performing this consultation and > 50% of this time was spent face to face counseling and coordinating the care of this patient's coronary artery disease.   Gaye Pollack 06/24/2019, 8:28 PM

## 2019-06-25 ENCOUNTER — Inpatient Hospital Stay (HOSPITAL_COMMUNITY): Payer: Medicare HMO

## 2019-06-25 ENCOUNTER — Other Ambulatory Visit (HOSPITAL_COMMUNITY): Payer: Self-pay | Admitting: Respiratory Therapy

## 2019-06-25 ENCOUNTER — Encounter (HOSPITAL_COMMUNITY): Payer: Self-pay | Admitting: Cardiovascular Disease

## 2019-06-25 DIAGNOSIS — I25110 Atherosclerotic heart disease of native coronary artery with unstable angina pectoris: Secondary | ICD-10-CM | POA: Diagnosis not present

## 2019-06-25 DIAGNOSIS — I2510 Atherosclerotic heart disease of native coronary artery without angina pectoris: Secondary | ICD-10-CM

## 2019-06-25 DIAGNOSIS — R079 Chest pain, unspecified: Secondary | ICD-10-CM | POA: Diagnosis not present

## 2019-06-25 DIAGNOSIS — Z01810 Encounter for preprocedural cardiovascular examination: Secondary | ICD-10-CM

## 2019-06-25 LAB — BASIC METABOLIC PANEL
Anion gap: 7 (ref 5–15)
BUN: 17 mg/dL (ref 8–23)
CO2: 23 mmol/L (ref 22–32)
Calcium: 8.5 mg/dL — ABNORMAL LOW (ref 8.9–10.3)
Chloride: 108 mmol/L (ref 98–111)
Creatinine, Ser: 0.77 mg/dL (ref 0.44–1.00)
GFR calc Af Amer: >60 mL/min (ref >60)
GFR calc non Af Amer: >60 mL/min (ref >60)
Glucose, Bld: 101 mg/dL — ABNORMAL HIGH (ref 70–99)
Potassium: 4.2 mmol/L (ref 3.5–5.1)
Sodium: 138 mmol/L (ref 135–145)

## 2019-06-25 LAB — PULMONARY FUNCTION TEST
FEF 25-75 Pre: 0.9 L/sec
FEF2575-%Pred-Pre: 62 %
FEV1-%Pred-Pre: 94 %
FEV1-Pre: 1.76 L
FEV1FVC-%Pred-Pre: 91 %
FEV6-%Pred-Pre: 103 %
FEV6-Pre: 2.44 L
FEV6FVC-%Pred-Pre: 100 %
FVC-%Pred-Pre: 103 %
FVC-Pre: 2.57 L
Pre FEV1/FVC ratio: 68 %
Pre FEV6/FVC Ratio: 95 %

## 2019-06-25 LAB — CBC
HCT: 35.2 % — ABNORMAL LOW (ref 36.0–46.0)
Hemoglobin: 11.3 g/dL — ABNORMAL LOW (ref 12.0–15.0)
MCH: 30.1 pg (ref 26.0–34.0)
MCHC: 32.1 g/dL (ref 30.0–36.0)
MCV: 93.6 fL (ref 80.0–100.0)
Platelets: 248 10*3/uL (ref 150–400)
RBC: 3.76 MIL/uL — ABNORMAL LOW (ref 3.87–5.11)
RDW: 13.2 % (ref 11.5–15.5)
WBC: 5.8 10*3/uL (ref 4.0–10.5)
nRBC: 0 % (ref 0.0–0.2)

## 2019-06-25 LAB — HEPARIN LEVEL (UNFRACTIONATED): Heparin Unfractionated: 0.27 IU/mL — ABNORMAL LOW (ref 0.30–0.70)

## 2019-06-25 MED ORDER — MILRINONE LACTATE IN DEXTROSE 20-5 MG/100ML-% IV SOLN
0.3000 ug/kg/min | INTRAVENOUS | Status: DC
  Filled 2019-06-25: qty 100

## 2019-06-25 MED ORDER — POTASSIUM CHLORIDE 2 MEQ/ML IV SOLN
80.0000 meq | INTRAVENOUS | Status: DC
  Filled 2019-06-25: qty 40

## 2019-06-25 MED ORDER — TRANEXAMIC ACID (OHS) PUMP PRIME SOLUTION
2.0000 mg/kg | INTRAVENOUS | Status: DC
  Filled 2019-06-25: qty 1.06

## 2019-06-25 MED ORDER — DOPAMINE-DEXTROSE 3.2-5 MG/ML-% IV SOLN
0.0000 ug/kg/min | INTRAVENOUS | Status: DC
  Filled 2019-06-25: qty 250

## 2019-06-25 MED ORDER — SODIUM CHLORIDE 0.9 % IV SOLN
INTRAVENOUS | Status: DC
  Filled 2019-06-25: qty 30

## 2019-06-25 MED ORDER — PLASMA-LYTE 148 IV SOLN
INTRAVENOUS | Status: DC
  Filled 2019-06-25: qty 2.5

## 2019-06-25 MED ORDER — DEXMEDETOMIDINE HCL IN NACL 400 MCG/100ML IV SOLN
0.1000 ug/kg/h | INTRAVENOUS | Status: AC
  Administered 2019-06-28: 09:00:00 .2 ug/kg/h via INTRAVENOUS
  Filled 2019-06-25: qty 100

## 2019-06-25 MED ORDER — EPINEPHRINE HCL 5 MG/250ML IV SOLN IN NS
0.0000 ug/min | INTRAVENOUS | Status: DC
  Filled 2019-06-25: qty 250

## 2019-06-25 MED ORDER — NITROGLYCERIN IN D5W 200-5 MCG/ML-% IV SOLN
2.0000 ug/min | INTRAVENOUS | Status: AC
  Administered 2019-06-28: 5 ug/min via INTRAVENOUS
  Filled 2019-06-25: qty 250

## 2019-06-25 MED ORDER — SODIUM CHLORIDE 0.9 % IV SOLN
1.5000 g | INTRAVENOUS | Status: AC
  Administered 2019-06-28: 08:00:00 1.5 g via INTRAVENOUS
  Filled 2019-06-25: qty 1.5

## 2019-06-25 MED ORDER — VANCOMYCIN HCL IN DEXTROSE 1-5 GM/200ML-% IV SOLN
1000.0000 mg | INTRAVENOUS | Status: AC
  Administered 2019-06-28: 1000 mg via INTRAVENOUS
  Filled 2019-06-25: qty 200

## 2019-06-25 MED ORDER — TRANEXAMIC ACID (OHS) BOLUS VIA INFUSION
15.0000 mg/kg | INTRAVENOUS | Status: AC
  Administered 2019-06-28: 08:00:00 796.5 mg via INTRAVENOUS
  Filled 2019-06-25: qty 797

## 2019-06-25 MED ORDER — VANCOMYCIN HCL 10 G IV SOLR
1250.0000 mg | INTRAVENOUS | Status: DC
  Filled 2019-06-25: qty 1250

## 2019-06-25 MED ORDER — PHENYLEPHRINE HCL-NACL 20-0.9 MG/250ML-% IV SOLN
30.0000 ug/min | INTRAVENOUS | Status: DC
  Filled 2019-06-25: qty 250

## 2019-06-25 MED ORDER — SODIUM CHLORIDE 0.9 % IV SOLN
750.0000 mg | INTRAVENOUS | Status: AC
  Administered 2019-06-28: 10:00:00 750 mg via INTRAVENOUS
  Filled 2019-06-25: qty 750

## 2019-06-25 MED ORDER — TRANEXAMIC ACID 1000 MG/10ML IV SOLN
1.5000 mg/kg/h | INTRAVENOUS | Status: AC
  Administered 2019-06-28: 09:00:00 1.5 mg/kg/h via INTRAVENOUS
  Filled 2019-06-25: qty 25

## 2019-06-25 MED ORDER — MAGNESIUM SULFATE 50 % IJ SOLN
40.0000 meq | INTRAMUSCULAR | Status: DC
  Filled 2019-06-25: qty 9.85

## 2019-06-25 MED ORDER — INSULIN REGULAR(HUMAN) IN NACL 100-0.9 UT/100ML-% IV SOLN
INTRAVENOUS | Status: AC
  Administered 2019-06-28: 1.9 [IU]/h via INTRAVENOUS
  Filled 2019-06-25: qty 100

## 2019-06-25 NOTE — Progress Notes (Signed)
CARDIAC REHAB PHASE I   Went to complete preop education with pt. Pt getting carotid dopplers completed. Left IS, heart surgery booklet, in-the-tube sheet, and OHS care guide at bedside. Will return as time allows to complete education.  [id number] Rufina Falco, RN BSN 06/25/2019 10:37 AM

## 2019-06-25 NOTE — Progress Notes (Signed)
[place] for heparin Indication: chest pain/ACS  Allergies  Allergen Reactions  . Fosamax [Alendronate] Nausea Only    Unset stomach  . Prednisone Other (See Comments)    Blood pressure high when she takes it  . Penicillins Rash    Did it involve swelling of the face/tongue/throat, SOB, or low BP? No Did it involve sudden or severe rash/hives, skin peeling, or any reaction on the inside of your mouth or nose? No Did you need to seek medical attention at a hospital or doctor's office? Yes When did it last happen?Childhood If all above answers are "NO", may proceed with cephalosporin use.    Patient Measurements: Height: 5\' 2"  (157.5 cm) Weight: 117 lb (53.1 kg) IBW/kg (Calculated) : 50.1 Heparin Dosing Weight: 53kg  Vital Signs: Temp: 98 F (36.7 C) (10/23 0755) Temp Source: Oral (10/23 0755) BP: 112/75 (10/23 0800) Pulse Rate: 73 (10/23 0800)  Labs: Recent Labs    06/25/19 0216 06/25/19 1105  HGB 11.3*  --   HCT 35.2*  --   PLT 248  --   HEPARINUNFRC  --  0.27*  CREATININE 0.77  --     Estimated Creatinine Clearance: 46.6 mL/min (by C-G formula based on SCr of 0.77 mg/dL).   Medical History: Past Medical History:  Diagnosis Date  . Bowel obstruction [facility])      Assessment: 12 yoF admitted with relook cath found to have 99% RCA with inability to cross lesion. CABG consult ordered, clopidogrel held, pharmacy to restart IV heparin 4hr after sheath removal 10/23. No AC PTA,   Heparin level came back slightly subtherapeutic at 0.27, on 650 units/hr. Hgb 11.3, plt 248. No s/sx of bleeding or infusion issues.   Goal of Therapy:  Heparin level 0.3-0.7 units/ml Monitor platelets by anticoagulation protocol: Yes   Plan:  -Increase heparin 750 units/hr to get into therapeutic range -Monitor heparin level, CBC, and for s/sx of bleeding  Antonietta Jewel, PharmD, BCCCP Clinical Pharmacist  Phone: [phone]  Please check  AMION for all [facility] phone numbers After 10:00 PM, call [place] [phone] 06/25/2019

## 2019-06-25 NOTE — Progress Notes (Signed)
CARDIAC REHAB PHASE I   Preop education completed with pt and daughter. Pt given IS and able to demonstrate 1750. Pt educated on importance of walks, IS use and sternal precautions post-op. Pt given in-th-tube sheet, heart surgery booklet, and OHS care guide. Encouraged pt to ambulate in room as able, but to pay close attention to her symptoms. Pt states she is anxious about surgery but is ready to start feeling better. Pt expresses gratitude to care she has received and has confidence in her medical team. Will continue to follow pt throughout he hospital stay.  [id number] Rufina Falco, RN BSN 06/25/2019 2:37 PM

## 2019-06-25 NOTE — Plan of Care (Signed)
  Problem: Education: Goal: Knowledge of General Education information will improve Description Including pain rating scale, medication(s)/side effects and non-pharmacologic comfort measures Outcome: Progressing   Problem: Health Behavior/Discharge Planning: Goal: Ability to manage health-related needs will improve Outcome: Progressing   Problem: Clinical Measurements: Goal: Ability to maintain clinical measurements within normal limits will improve Outcome: Progressing Goal: Will remain free from infection Outcome: Progressing Goal: Diagnostic test results will improve Outcome: Progressing Goal: Respiratory complications will improve Outcome: Progressing Goal: Cardiovascular complication will be avoided Outcome: Progressing   Problem: Activity: Goal: Risk for activity intolerance will decrease Outcome: Progressing   Problem: Nutrition: Goal: Adequate nutrition will be maintained Outcome: Progressing   Problem: Coping: Goal: Level of anxiety will decrease Outcome: Progressing   Problem: Elimination: Goal: Will not experience complications related to bowel motility Outcome: Progressing Goal: Will not experience complications related to urinary retention Outcome: Progressing   Problem: Pain Managment: Goal: General experience of comfort will improve Outcome: Progressing   Problem: Safety: Goal: Ability to remain free from injury will improve Outcome: Progressing   Problem: Skin Integrity: Goal: Risk for impaired skin integrity will decrease Outcome: Progressing   Problem: Clinical Measurements: Goal: Postoperative complications will be avoided or minimized Outcome: Progressing   Problem: Skin Integrity: Goal: Demonstration of wound healing without infection will improve Outcome: Progressing   

## 2019-06-25 NOTE — Progress Notes (Signed)
1 Day Post-Op Procedure(s) (LRB): LEFT HEART CATH AND CORONARY ANGIOGRAPHY (N/A) Subjective: She has some chest discomfort and nausea ( her typical symptoms)  last night but none today. On IV heparin. Has been up out of bed.  Objective: Vital signs in last 24 hours: Temp:  [98 F (36.7 C)-98.5 F (36.9 C)] 98 F (36.7 C) (10/23 0755) Pulse Rate:  [60-86] 60 (10/23 1200) Cardiac Rhythm: Normal sinus rhythm (10/23 0700) Resp:  [13-22] 21 (10/23 1200) BP: (89-146)/(44-126) 98/44 (10/23 1200) SpO2:  [96 %-100 %] 98 % (10/23 1200)  Hemodynamic parameters for last 24 hours:    Intake/Output from previous day: 10/22 0701 - 10/23 0700 In: 1091.2 [P.O.:240; I.V.:851.2] Out: -  Intake/Output this shift: Total I/O In: 294.4 [P.O.:240; I.V.:54.4] Out: -   General appearance: alert and cooperative Neurologic: intact Heart: regular rate and rhythm, S1, S2 normal, no murmur, click, rub or gallop Lungs: clear to auscultation bilaterally  Lab Results: Recent Labs    06/25/19 0216  WBC 5.8  HGB 11.3*  HCT 35.2*  PLT 248   BMET:  Recent Labs    06/25/19 0216  NA 138  K 4.2  CL 108  CO2 23  GLUCOSE 101*  BUN 17  CREATININE 0.77  CALCIUM 8.5*    PT/INR: No results for input(s): LABPROT, INR in the last 72 hours. ABG No results found for: PHART, HCO3, TCO2, ACIDBASEDEF, O2SAT CBG (last 3)  No results for input(s): GLUCAP in the last 72 hours.  Assessment/Plan:  Severe RCA restenosis with unsuccessful attempt at PCI. Plan CABG Monday am after Plavix washout. She has had some recurrent chest discomfort and nausea. She may need to get on IV NTG if this recurs. Dr. Orvan Seen is on call for our service over the weekend.  LOS: 1 day    Gaye Pollack 06/25/2019

## 2019-06-25 NOTE — Progress Notes (Signed)
Patient's right radial site has been supposedly oozing most of the day. Pressure applied for 25 minutes and new dressing reapplied. Will keep monitoring.

## 2019-06-25 NOTE — Progress Notes (Signed)
Pre cabg has been completed.   Preliminary results in CV Proc.   Abram Sander 06/25/2019 11:28 AM

## 2019-06-25 NOTE — Progress Notes (Signed)
.   Progress Note  Patient Name: Rachel Valentine Date of Encounter: 06/25/2019  Primary Cardiologist: Quay Burow, MD   Subjective   Chart reviewed.  No recurrence of pain.  Seen by Dr. Caffie Pinto and plan is for single-vessel coronary bypass surgery on Monday.  Plavix washing out.  Inpatient Medications    Scheduled Meds: . amLODipine  2.5 mg Oral QHS  . aspirin EC  81 mg Oral Daily  . metoprolol tartrate  12.5 mg Oral BID  . pantoprazole  40 mg Oral QHS  . rosuvastatin  40 mg Oral Daily  . sodium chloride flush  3 mL Intravenous Q12H   Continuous Infusions: . sodium chloride    . heparin 650 Units/hr (06/25/19 0234)   PRN Meds: sodium chloride, acetaminophen, morphine injection, nitroGLYCERIN, ondansetron (ZOFRAN) IV, sodium chloride flush   Vital Signs    Vitals:   06/25/19 0000 06/25/19 0409 06/25/19 0700 06/25/19 0755  BP: (!) 104/45 (!) 89/49 (!) 108/91 (!) 109/48  Pulse: 64 62 80 77  Resp: 17 19  17   Temp:  98.1 F (36.7 C)  98 F (36.7 C)  TempSrc:  Oral  Oral  SpO2: 96% 100% 98% 98%  Weight:      Height:        Intake/Output Summary (Last 24 hours) at 06/25/2019 0758 Last data filed at 06/25/2019 0758 Gross per 24 hour  Intake 1331.19 ml  Output -  Net 1331.19 ml   Last 3 Weights 06/24/2019 06/21/2019 06/15/2019  Weight (lbs) 117 lb 118 lb 118 lb 12.8 oz  Weight (kg) 53.071 kg 53.524 kg 53.887 kg      Telemetry    Normal sinus rhythm- Personally Reviewed  ECG    Normal sinus rhythm with nonspecific ST abnormality.  No acute ST-T change.- Personally Reviewed  Physical Exam  Slender, asymptomatic. GEN: No acute distress.   Neck: No JVD Cardiac:  No murmur.  Right radial access site is unremarkable. Respiratory: Clear to auscultation bilaterally. GI: Soft, nontender, non-distended  MS: No edema; No deformity. Neuro:  Nonfocal  Psych: Normal affect   Labs    High Sensitivity Troponin:  No results for input(s): TROPONINIHS in the  last 720 hours.    Chemistry Recent Labs  Lab 06/21/19 1436 06/25/19 0216  NA 143 138  K 4.7 4.2  CL 103 108  CO2 26 23  GLUCOSE 102* 101*  BUN 15 17  CREATININE 0.79 0.77  CALCIUM 10.3 8.5*  GFRNONAA 72 >60  GFRAA 84 >60  ANIONGAP  --  7     Hematology Recent Labs  Lab 06/21/19 1436 06/25/19 0216  WBC 8.8 5.8  RBC 4.52 3.76*  HGB 13.9 11.3*  HCT 40.4 35.2*  MCV 89 93.6  MCH 30.8 30.1  MCHC 34.4 32.1  RDW 12.9 13.2  PLT 343 248    BNPNo results for input(s): BNP, PROBNP in the last 168 hours.   DDimer No results for input(s): DDIMER in the last 168 hours.   Radiology    No results found.  Cardiac Studies   Cardiac cath 06/24/2019: Diagnostic Dominance: Right  Intervention   Proximal to distal LAD calcified with moderate nonobstructive disease up to 40 to 50% in multiple sites.  Circumflex with lucent 50% eccentric proximal stenosis.  Stent is widely patent.  RCA is diffusely diseased with diffuse in-stent restenosis in the region of overlap of stents.  Unable to cross with wire.  Patient Profile     78 y.o.  female with unstable angina, hypertension, coronary artery disease with most recent cardiac catheterization on 01/04/2019 revealing normal LV function, 90% circumflex treated with mid circumflex drug-eluting stent x1 Ellyn Hack).  Staged atherectomy and drug-eluting stent x2 to her mid and distal RCA on 01/06/2019 by Dr. Angelena Form.  Presented with diffuse RCA in-stent restenosis and inability to cross with wire likely related to underexpanded stents within the mid RCA stented segment.  Has been seen by Dr. Cyndia Bent and plan for single-vessel bypass Monday.  Plavix washout is underway.  Assessment & Plan    1. Coronary artery disease with early right coronary stent failure and heavily calcified RCA.  Plan single-vessel coronary bypass early next week.  Nonobstructive CAD involving LAD, left main, and circumflex.  Continue IV heparin.  IV nitroglycerin if  recurrent chest pain. 2. Hyperlipidemia. 3. Essential hypertension   For questions or updates, please contact [facility] Please consult [email or web address] for contact info under        Signed, Sinclair Grooms, MD  06/25/2019, 7:58 AM

## 2019-06-26 DIAGNOSIS — Z9861 Coronary angioplasty status: Secondary | ICD-10-CM

## 2019-06-26 DIAGNOSIS — I2510 Atherosclerotic heart disease of native coronary artery without angina pectoris: Secondary | ICD-10-CM | POA: Diagnosis not present

## 2019-06-26 DIAGNOSIS — R079 Chest pain, unspecified: Secondary | ICD-10-CM | POA: Diagnosis not present

## 2019-06-26 LAB — CBC
HCT: 34.1 % — ABNORMAL LOW (ref 36.0–46.0)
Hemoglobin: 11.1 g/dL — ABNORMAL LOW (ref 12.0–15.0)
MCH: 30.3 pg (ref 26.0–34.0)
MCHC: 32.6 g/dL (ref 30.0–36.0)
MCV: 93.2 fL (ref 80.0–100.0)
Platelets: 234 10*3/uL (ref 150–400)
RBC: 3.66 MIL/uL — ABNORMAL LOW (ref 3.87–5.11)
RDW: 13.3 % (ref 11.5–15.5)
WBC: 6.2 10*3/uL (ref 4.0–10.5)
nRBC: 0 % (ref 0.0–0.2)

## 2019-06-26 LAB — HEPARIN LEVEL (UNFRACTIONATED): Heparin Unfractionated: 0.49 IU/mL (ref 0.30–0.70)

## 2019-06-26 NOTE — Progress Notes (Signed)
[place] for heparin Indication: chest pain/ACS  Allergies  Allergen Reactions  . Fosamax [Alendronate] Nausea Only    Unset stomach  . Prednisone Other (See Comments)    Blood pressure high when she takes it  . Penicillins Rash    Did it involve swelling of the face/tongue/throat, SOB, or low BP? No Did it involve sudden or severe rash/hives, skin peeling, or any reaction on the inside of your mouth or nose? No Did you need to seek medical attention at a hospital or doctor's office? Yes When did it last happen?Childhood If all above answers are "NO", may proceed with cephalosporin use.    Patient Measurements: Height: 5\' 2"  (157.5 cm) Weight: 117 lb (53.1 kg) IBW/kg (Calculated) : 50.1 Heparin Dosing Weight: 53kg  Vital Signs: Temp: 98.5 F (36.9 C) (10/24 0722) Temp Source: Other (Comment) (10/24 0722) BP: 124/56 (10/24 0722) Pulse Rate: 81 (10/24 0722)  Labs: Recent Labs    06/25/19 0216 06/25/19 1105 06/26/19 0213  HGB 11.3*  --  11.1*  HCT 35.2*  --  34.1*  PLT 248  --  234  HEPARINUNFRC  --  0.27* 0.49  CREATININE 0.77  --   --     Estimated Creatinine Clearance: 46.6 mL/min (by C-G formula based on SCr of 0.77 mg/dL).   Medical History: Past Medical History:  Diagnosis Date  . Bowel obstruction [facility])      Assessment: 15 yoF admitted with relook cath found to have 99% RCA with inability to cross lesion. CABG consult ordered, clopidogrel held, pharmacy to restart IV heparin 4hr after sheath removal 10/23. No AC PTA,   Heparin level at goal 0.49, on 750 units/hr. Hgb 11, plt 234. No s/sx of bleeding or infusion issues.   Goal of Therapy:  Heparin level 0.3-0.7 units/ml Monitor platelets by anticoagulation protocol: Yes   Plan:  -Continue heparin 750 units/hr  -Monitor heparin level, CBC, and for s/sx of bleeding  Erin Hearing PharmD., BCPS Clinical Pharmacist 06/26/2019 8:59 AM

## 2019-06-26 NOTE — Plan of Care (Signed)
  Problem: Education: Goal: Knowledge of General Education information will improve Description Including pain rating scale, medication(s)/side effects and non-pharmacologic comfort measures Outcome: Progressing   Problem: Health Behavior/Discharge Planning: Goal: Ability to manage health-related needs will improve Outcome: Progressing   Problem: Clinical Measurements: Goal: Ability to maintain clinical measurements within normal limits will improve Outcome: Progressing Goal: Will remain free from infection Outcome: Progressing Goal: Diagnostic test results will improve Outcome: Progressing Goal: Respiratory complications will improve Outcome: Progressing Goal: Cardiovascular complication will be avoided Outcome: Progressing   Problem: Activity: Goal: Risk for activity intolerance will decrease Outcome: Progressing   Problem: Nutrition: Goal: Adequate nutrition will be maintained Outcome: Progressing   Problem: Coping: Goal: Level of anxiety will decrease Outcome: Progressing   Problem: Elimination: Goal: Will not experience complications related to bowel motility Outcome: Progressing Goal: Will not experience complications related to urinary retention Outcome: Progressing   Problem: Pain Managment: Goal: General experience of comfort will improve Outcome: Progressing   Problem: Safety: Goal: Ability to remain free from injury will improve Outcome: Progressing   Problem: Skin Integrity: Goal: Risk for impaired skin integrity will decrease Outcome: Progressing   Problem: Clinical Measurements: Goal: Postoperative complications will be avoided or minimized Outcome: Progressing   Problem: Skin Integrity: Goal: Demonstration of wound healing without infection will improve Outcome: Progressing   

## 2019-06-26 NOTE — Progress Notes (Signed)
Pt ambulated in a hallway twice without chest pain and distress, vitals stable, IV heparin continue, woozing from catheter site stopped since AM, Daughter was visiting and updated, will continue to monitor the patient  Palma Holter, RN

## 2019-06-26 NOTE — Progress Notes (Signed)
.   Progress Note  Patient Name: Rachel Valentine Date of Encounter: 06/26/2019  Primary Cardiologist: Quay Burow, MD   Subjective   No chest pain overnight. Plan for CABG Monday after Plavix washout.  Inpatient Medications    Scheduled Meds: . amLODipine  2.5 mg Oral QHS  . aspirin EC  81 mg Oral Daily  . [START ON 06/28/2019] epinephrine  0-10 mcg/min Intravenous To OR  . [START ON 06/28/2019] heparin-papaverine-plasmalyte irrigation   Irrigation To OR  . [START ON 06/28/2019] insulin   Intravenous To OR  . [START ON 06/28/2019] magnesium sulfate  40 mEq Other To OR  . metoprolol tartrate  12.5 mg Oral BID  . pantoprazole  40 mg Oral QHS  . [START ON 06/28/2019] phenylephrine  30-200 mcg/min Intravenous To OR  . [START ON 06/28/2019] potassium chloride  80 mEq Other To OR  . rosuvastatin  40 mg Oral Daily  . sodium chloride flush  3 mL Intravenous Q12H  . [START ON 06/28/2019] tranexamic acid  15 mg/kg Intravenous To OR  . [START ON 06/28/2019] tranexamic acid  2 mg/kg Intracatheter To OR   Continuous Infusions: . sodium chloride    . [START ON 06/28/2019] cefUROXime (ZINACEF)  IV    . [START ON 06/28/2019] cefUROXime (ZINACEF)  IV    . [START ON 06/28/2019] dexmedetomidine    . [START ON 06/28/2019] DOPamine    . [START ON 06/28/2019] heparin 30,000 units/NS 1000 mL solution for CELLSAVER    . heparin 750 Units/hr (06/26/19 0841)  . [START ON 06/28/2019] milrinone    . [START ON 06/28/2019] nitroGLYCERIN    . [START ON 06/28/2019] tranexamic acid (CYKLOKAPRON) infusion ([facility])    . [START ON 06/28/2019] vancomycin     PRN Meds: sodium chloride, acetaminophen, morphine injection, nitroGLYCERIN, ondansetron (ZOFRAN) IV, sodium chloride flush   Vital Signs    Vitals:   06/25/19 2336 06/26/19 0349 06/26/19 0722 06/26/19 1042  BP: (!) 106/56 113/63 (!) 124/56 106/63  Pulse: 65 77 81 64  Resp: 14 14 17    Temp: (!) 97.3 F (36.3 C) 97.7 F (36.5 C) 98.5 F  (36.9 C) 97.8 F (36.6 C)  TempSrc: Oral Oral Oral Oral  SpO2: 97% 98% 97%   Weight:      Height:        Intake/Output Summary (Last 24 hours) at 06/26/2019 1255 Last data filed at 06/26/2019 0851 Gross per 24 hour  Intake 430.59 ml  Output 350 ml  Net 80.59 ml   Last 3 Weights 06/24/2019 06/21/2019 06/15/2019  Weight (lbs) 117 lb 118 lb 118 lb 12.8 oz  Weight (kg) 53.071 kg 53.524 kg 53.887 kg      Telemetry    Normal sinus rhythm- Personally Reviewed  ECG    N/A  Physical Exam   General appearance: alert and no distress Lungs: clear to auscultation bilaterally Heart: regular rate and rhythm, S1, S2 normal, no murmur, click, rub or gallop Extremities: extremities normal, atraumatic, no cyanosis or edema Neurologic: Grossly normal  Labs    High Sensitivity Troponin:  No results for input(s): TROPONINIHS in the last 720 hours.    Chemistry Recent Labs  Lab 06/21/19 1436 06/25/19 0216  NA 143 138  K 4.7 4.2  CL 103 108  CO2 26 23  GLUCOSE 102* 101*  BUN 15 17  CREATININE 0.79 0.77  CALCIUM 10.3 8.5*  GFRNONAA 72 >60  GFRAA 84 >60  ANIONGAP  --  7  Hematology Recent Labs  Lab 06/21/19 1436 06/25/19 0216 06/26/19 0213  WBC 8.8 5.8 6.2  RBC 4.52 3.76* 3.66*  HGB 13.9 11.3* 11.1*  HCT 40.4 35.2* 34.1*  MCV 89 93.6 93.2  MCH 30.8 30.1 30.3  MCHC 34.4 32.1 32.6  RDW 12.9 13.2 13.3  PLT 343 248 234    BNPNo results for input(s): BNP, PROBNP in the last 168 hours.   DDimer No results for input(s): DDIMER in the last 168 hours.   Radiology    Vas US Doppler Pre Cabg  Result Date: 06/25/2019 PREOPERATIVE VASCULAR EVALUATION  Indications:      Pre cabg. Comparison Study: no prior Performing Technologist: Abram Sander RVS  Examination Guidelines: A complete evaluation includes B-mode imaging, spectral Doppler, color Doppler, and power Doppler as needed of all accessible portions of each vessel. Bilateral testing is considered an integral part  of a complete examination. Limited examinations for reoccurring indications may be performed as noted.  Right Carotid Findings: +----------+--------+--------+--------+-------------------------+---------+           PSV cm/sEDV cm/sStenosisDescribe                 Comments  +----------+--------+--------+--------+-------------------------+---------+ CCA Prox  58                      heterogenous                       +----------+--------+--------+--------+-------------------------+---------+ CCA Distal83      12              heterogenous                       +----------+--------+--------+--------+-------------------------+---------+ ICA Prox  98      16      1-39%   heterogenous and calcificShadowing +----------+--------+--------+--------+-------------------------+---------+ ICA Distal88      20                                                 +----------+--------+--------+--------+-------------------------+---------+ ECA       161                                                        +----------+--------+--------+--------+-------------------------+---------+ Portions of this table do not appear on this page. +----------+--------+-------+--------+------------+           PSV cm/sEDV cmsDescribeArm Pressure +----------+--------+-------+--------+------------+ Subclavian103                                 +----------+--------+-------+--------+------------+ +---------+--------+--+--------+--+---------+ VertebralPSV cm/s81EDV cm/s11Antegrade +---------+--------+--+--------+--+---------+ Left Carotid Findings: +----------+--------+--------+--------+------------+--------+           PSV cm/sEDV cm/sStenosisDescribe    Comments +----------+--------+--------+--------+------------+--------+ CCA Prox  103     12              heterogenous         +----------+--------+--------+--------+------------+--------+ CCA Distal100     14               heterogenous         +----------+--------+--------+--------+------------+--------+ ICA Prox  84      14  1-39%   heterogenous         +----------+--------+--------+--------+------------+--------+ ICA Distal98      18                                   +----------+--------+--------+--------+------------+--------+ ECA       288                                          +----------+--------+--------+--------+------------+--------+ +----------+--------+--------+--------+------------+ SubclavianPSV cm/sEDV cm/sDescribeArm Pressure +----------+--------+--------+--------+------------+           139                     113          +----------+--------+--------+--------+------------+ +---------+--------+--+--------+-+---------+ VertebralPSV cm/s43EDV cm/s9Antegrade +---------+--------+--+--------+-+---------+  ABI Findings: +--------+------------------+-----+---------+--------+ Right   Rt Pressure (mmHg)IndexWaveform Comment  +--------+------------------+-----+---------+--------+ Brachial                       triphasic         +--------+------------------+-----+---------+--------+ ATA                            triphasic         +--------+------------------+-----+---------+--------+ PTA                            triphasic         +--------+------------------+-----+---------+--------+ +--------+------------------+-----+---------+-------+ Left    Lt Pressure (mmHg)IndexWaveform Comment +--------+------------------+-----+---------+-------+ [id number]                    triphasic        +--------+------------------+-----+---------+-------+ ATA                            triphasic        +--------+------------------+-----+---------+-------+ PTA                            triphasic        +--------+------------------+-----+---------+-------+  Right Doppler Findings: +--------+--------+-----+---------+--------+ Site     PressureIndexDoppler  Comments +--------+--------+-----+---------+--------+ Brachial             triphasic         +--------+--------+-----+---------+--------+ Radial               triphasic         +--------+--------+-----+---------+--------+ Ulnar                triphasic         +--------+--------+-----+---------+--------+  Left Doppler Findings: +--------+--------+-----+---------+--------+ Site    PressureIndexDoppler  Comments +--------+--------+-----+---------+--------+ [id number]          triphasic         +--------+--------+-----+---------+--------+ Radial               triphasic         +--------+--------+-----+---------+--------+ Ulnar                triphasic         +--------+--------+-----+---------+--------+  Summary: Right Carotid: Velocities in the right ICA are consistent with a 1-39% stenosis. Left Carotid: Velocities in the left ICA are consistent with a 1-39% stenosis. Vertebrals: Bilateral vertebral arteries demonstrate antegrade  flow. Right Upper Extremity: Doppler waveforms remain within normal limits with right radial compression. Doppler waveform obliterate with right ulnar compression. Left Upper Extremity: Doppler waveforms remain within normal limits with left radial compression. Doppler waveform obliterate with left ulnar compression.  Electronically signed by Monica Martinez MD on 06/25/2019 at 3:15:03 PM.    Final     Cardiac Studies   Cardiac cath 06/24/2019: Diagnostic Dominance: Right  Intervention   Proximal to distal LAD calcified with moderate nonobstructive disease up to 40 to 50% in multiple sites.  Circumflex with lucent 50% eccentric proximal stenosis.  Stent is widely patent.  RCA is diffusely diseased with diffuse in-stent restenosis in the region of overlap of stents.  Unable to cross with wire.  Patient Profile     78 y.o. female with unstable angina, hypertension, coronary artery disease with most  recent cardiac catheterization on 01/04/2019 revealing normal LV function, 90% circumflex treated with mid circumflex drug-eluting stent x1 Ellyn Hack).  Staged atherectomy and drug-eluting stent x2 to her mid and distal RCA on 01/06/2019 by Dr. Angelena Form.  Presented with diffuse RCA in-stent restenosis and inability to cross with wire likely related to underexpanded stents within the mid RCA stented segment.  Has been seen by Dr. Cyndia Bent and plan for single-vessel bypass Monday.  Plavix washout is underway.  Assessment & Plan    1. Coronary artery disease with early right coronary stent failure and heavily calcified RCA.  Plan single-vessel coronary bypass early next week.  Nonobstructive CAD involving LAD, left main, and circumflex.  Continue IV heparin.  IV nitroglycerin if recurrent chest pain. 2. Hyperlipidemia on Crestor 40 mg daily. LDL 85 in 01/2019, check FLP in am - goal LDL<70. 3. Essential hypertension on metoprolol and amlodipine. BP borderline hypotensive. May be able to stop amlodipine.   For questions or updates, please contact [place] Please consult [email or web address] for contact info under   Pixie Casino, MD, FACC, [place] Director of the [facility] Diplomate of the American Board of Clinical Lipidology Attending Cardiologist  Direct Dial: [phone]  Fax: [phone]  Website:  [email or web address]  Pixie Casino, MD  06/26/2019, 12:55 PM

## 2019-06-27 ENCOUNTER — Inpatient Hospital Stay (HOSPITAL_COMMUNITY): Payer: Medicare HMO

## 2019-06-27 DIAGNOSIS — E782 Mixed hyperlipidemia: Secondary | ICD-10-CM | POA: Diagnosis not present

## 2019-06-27 DIAGNOSIS — R079 Chest pain, unspecified: Secondary | ICD-10-CM | POA: Diagnosis not present

## 2019-06-27 DIAGNOSIS — I2510 Atherosclerotic heart disease of native coronary artery without angina pectoris: Secondary | ICD-10-CM | POA: Diagnosis not present

## 2019-06-27 DIAGNOSIS — Z9861 Coronary angioplasty status: Secondary | ICD-10-CM | POA: Diagnosis not present

## 2019-06-27 LAB — CBC
HCT: 35.1 % — ABNORMAL LOW (ref 36.0–46.0)
Hemoglobin: 11.5 g/dL — ABNORMAL LOW (ref 12.0–15.0)
MCH: 30.2 pg (ref 26.0–34.0)
MCHC: 32.8 g/dL (ref 30.0–36.0)
MCV: 92.1 fL (ref 80.0–100.0)
Platelets: 229 10*3/uL (ref 150–400)
RBC: 3.81 MIL/uL — ABNORMAL LOW (ref 3.87–5.11)
RDW: 13.2 % (ref 11.5–15.5)
WBC: 6.3 10*3/uL (ref 4.0–10.5)
nRBC: 0 % (ref 0.0–0.2)

## 2019-06-27 LAB — BASIC METABOLIC PANEL
Anion gap: 7 (ref 5–15)
BUN: 12 mg/dL (ref 8–23)
CO2: 24 mmol/L (ref 22–32)
Calcium: 8.6 mg/dL — ABNORMAL LOW (ref 8.9–10.3)
Chloride: 108 mmol/L (ref 98–111)
Creatinine, Ser: 0.68 mg/dL (ref 0.44–1.00)
GFR calc Af Amer: >60 mL/min (ref >60)
GFR calc non Af Amer: >60 mL/min (ref >60)
Glucose, Bld: 115 mg/dL — ABNORMAL HIGH (ref 70–99)
Potassium: 3.7 mmol/L (ref 3.5–5.1)
Sodium: 139 mmol/L (ref 135–145)

## 2019-06-27 LAB — MRSA PCR SCREENING: MRSA by PCR: NEGATIVE

## 2019-06-27 LAB — ABO/RH: ABO/RH(D): O POS

## 2019-06-27 LAB — LIPID PANEL
Cholesterol: 104 mg/dL (ref 0–200)
HDL: 49 mg/dL (ref >40)
LDL Cholesterol: 41 mg/dL (ref 0–99)
Total CHOL/HDL Ratio: 2.1 RATIO
Triglycerides: 68 mg/dL (ref <150)
VLDL: 14 mg/dL (ref 0–40)

## 2019-06-27 LAB — HEPARIN LEVEL (UNFRACTIONATED): Heparin Unfractionated: 0.5 IU/mL (ref 0.30–0.70)

## 2019-06-27 IMAGING — DX DG CHEST 1V PORT
1 series · 1 of 1 positions shown · non-contrast
Comparison: [DATE] chest radiograph.

CLINICAL DATA: Preoperative for cardiac bypass surgery

EXAM:
PORTABLE CHEST 1 VIEW

[chest]
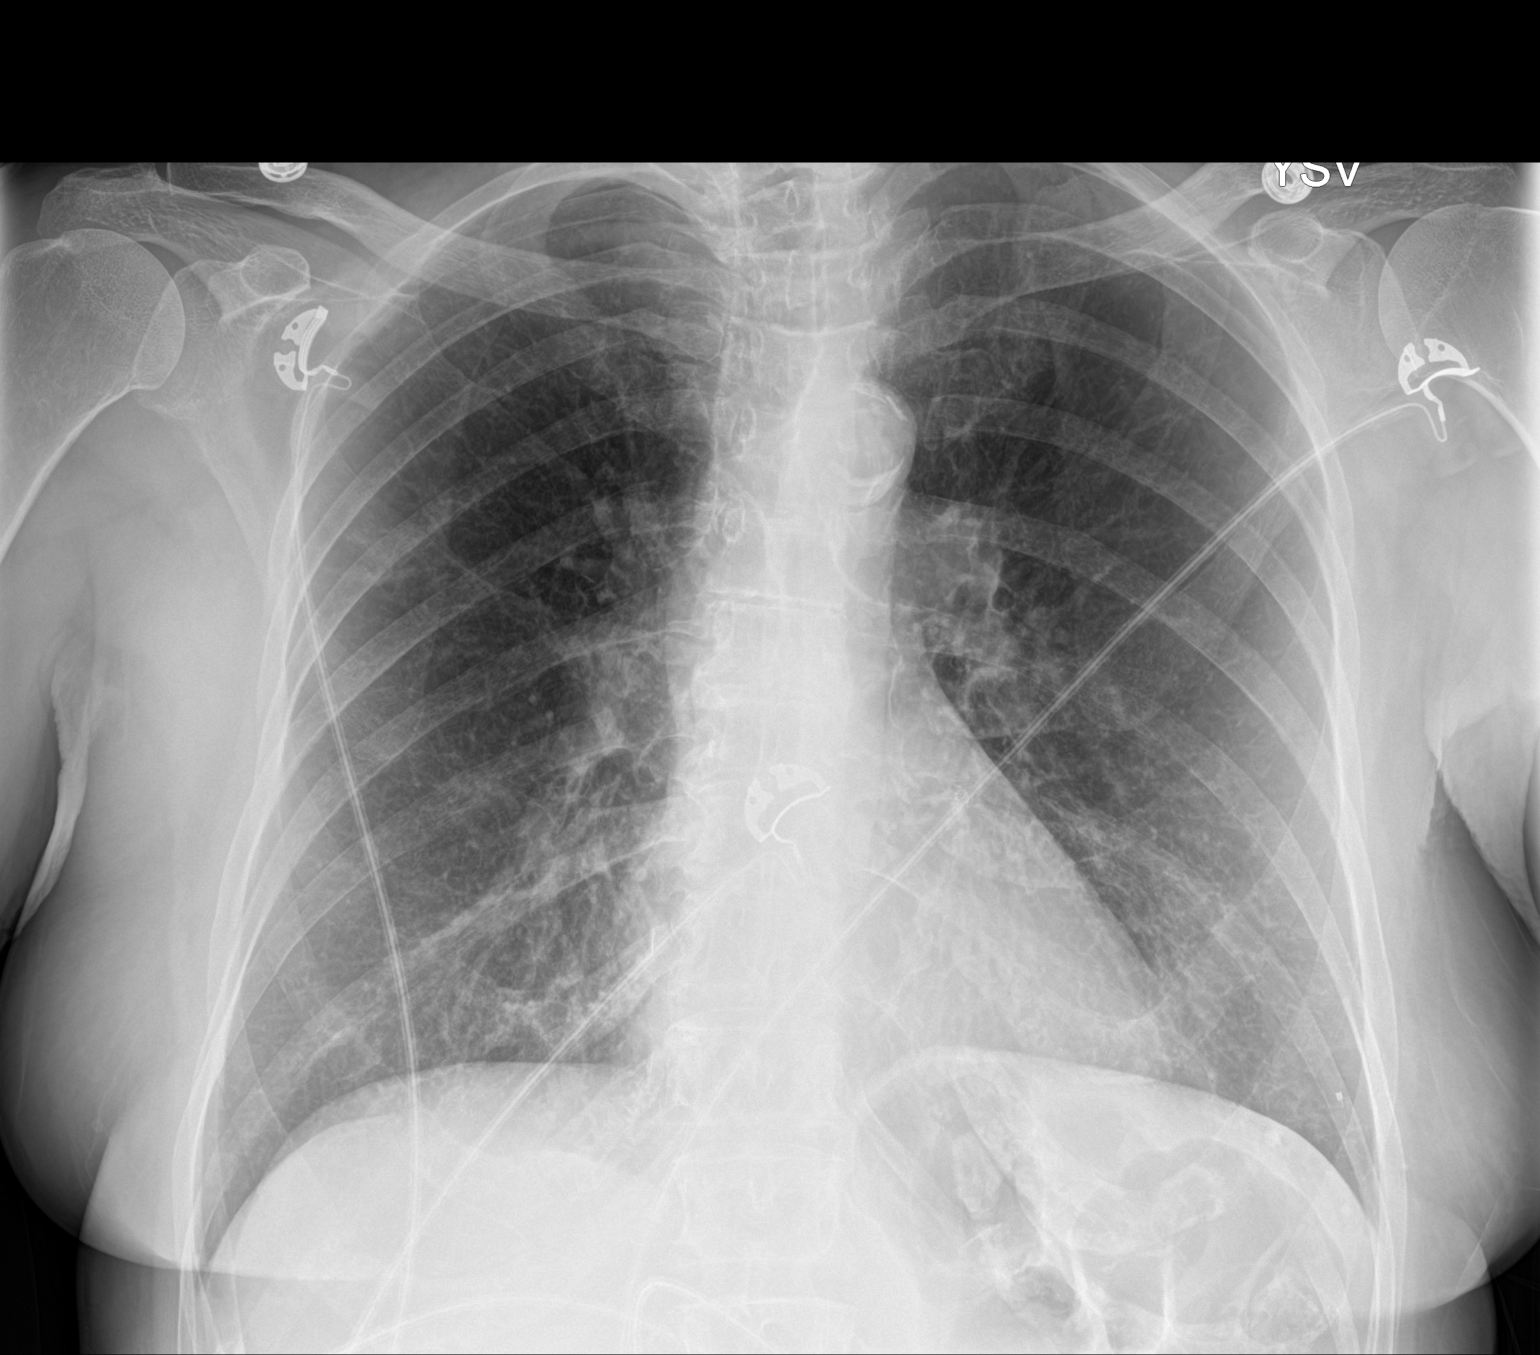

[1 of 1 positions shown; findings below may reference images not displayed]

FINDINGS: Stable cardiomediastinal silhouette with normal heart size. No
pneumothorax. No pleural effusion. Lungs appear clear, with no acute
consolidative airspace disease and no pulmonary edema.
IMPRESSION: No active disease.

## 2019-06-27 MED ORDER — METOPROLOL TARTRATE 12.5 MG HALF TABLET
12.5000 mg | ORAL_TABLET | Freq: Once | ORAL | Status: AC
  Administered 2019-06-28: 12.5 mg via ORAL
  Filled 2019-06-27: qty 1

## 2019-06-27 MED ORDER — CHLORHEXIDINE GLUCONATE 4 % EX LIQD
CUTANEOUS | Status: AC
  Filled 2019-06-27: qty 15

## 2019-06-27 MED ORDER — CHLORHEXIDINE GLUCONATE CLOTH 2 % EX PADS
6.0000 | MEDICATED_PAD | Freq: Once | CUTANEOUS | Status: AC
  Administered 2019-06-28: 6 via TOPICAL

## 2019-06-27 MED ORDER — TEMAZEPAM 15 MG PO CAPS: 15.0000 mg | ORAL_CAPSULE | Freq: Once | ORAL | Status: DC | PRN

## 2019-06-27 MED ORDER — DIAZEPAM 5 MG PO TABS
5.0000 mg | ORAL_TABLET | Freq: Once | ORAL | Status: AC
  Administered 2019-06-28: 5 mg via ORAL
  Filled 2019-06-27: qty 1

## 2019-06-27 MED ORDER — CHLORHEXIDINE GLUCONATE 0.12 % MT SOLN
15.0000 mL | Freq: Once | OROMUCOSAL | Status: AC
  Administered 2019-06-28: 15 mL via OROMUCOSAL
  Filled 2019-06-27: qty 15

## 2019-06-27 MED ORDER — BISACODYL 5 MG PO TBEC
5.0000 mg | DELAYED_RELEASE_TABLET | Freq: Once | ORAL | Status: AC
  Administered 2019-06-27: 5 mg via ORAL
  Filled 2019-06-27: qty 1

## 2019-06-27 NOTE — Progress Notes (Signed)
Consent taken for surgery tomorrow. MRSA  PCR swab collected.Pt is aware of surgery and NPO from midnight. Other procedures are on board for tonight, IV heparin continue, pt is ambulating in a hallway without a complain of CP and distress, will continue to monitor the patient  Palma Holter, RN

## 2019-06-27 NOTE — Progress Notes (Signed)
[place] for heparin Indication: chest pain/ACS  Allergies  Allergen Reactions  . Fosamax [Alendronate] Nausea Only    Unset stomach  . Prednisone Other (See Comments)    Blood pressure high when she takes it  . Penicillins Rash    Did it involve swelling of the face/tongue/throat, SOB, or low BP? No Did it involve sudden or severe rash/hives, skin peeling, or any reaction on the inside of your mouth or nose? No Did you need to seek medical attention at a hospital or doctor's office? Yes When did it last happen?Childhood If all above answers are "NO", may proceed with cephalosporin use.    Patient Measurements: Height: 5\' 2"  (157.5 cm) Weight: 117 lb (53.1 kg) IBW/kg (Calculated) : 50.1 Heparin Dosing Weight: 53kg  Vital Signs: Temp: 97.7 F (36.5 C) (10/25 0735) Temp Source: Oral (10/25 0735) BP: 122/60 (10/25 0735) Pulse Rate: 79 (10/25 0735)  Labs: Recent Labs    06/25/19 0216 06/25/19 1105 06/26/19 0213 06/27/19 0221  HGB 11.3*  --  11.1* 11.5*  HCT 35.2*  --  34.1* 35.1*  PLT 248  --  234 229  HEPARINUNFRC  --  0.27* 0.49 0.50  CREATININE 0.77  --   --  0.68    Estimated Creatinine Clearance: 46.6 mL/min (by C-G formula based on SCr of 0.68 mg/dL).   Medical History: Past Medical History:  Diagnosis Date  . Bowel obstruction [facility])      Assessment: 6 yoF admitted with relook cath found to have 99% RCA with inability to cross lesion. CABG consult ordered, clopidogrel held, pharmacy to restart IV heparin 4hr after sheath removal 10/23. No AC PTA,   Heparin level at goal 0.5, on 750 units/hr. Hgb 11s, plt 229. No s/sx of bleeding or infusion issues.   Goal of Therapy:  Heparin level 0.3-0.7 units/ml Monitor platelets by anticoagulation protocol: Yes   Plan:  -Continue heparin 750 units/hr  -Monitor heparin level, CBC, and for s/sx of bleeding  Erin Hearing PharmD., BCPS Clinical Pharmacist 06/27/2019  10:49 AM

## 2019-06-27 NOTE — Progress Notes (Signed)
At 1820 pt complain little pressure over chest  And it is 4 out of 10 and she does not want to use nitro this time since pain is minimal So tylenol given, lab is in bed side for troponin level, heparin continue @ 7.5 Will continue to monitor the patient  Palma Holter, RN

## 2019-06-27 NOTE — Progress Notes (Signed)
.   Progress Note  Patient Name: Rachel Valentine Date of Encounter: 06/27/2019  Primary Cardiologist: Quay Burow, MD   Subjective   No issues overnight -  A few questions about CABG tomorrow. Occasionally has chest discomfort with rest, but not with exertion.  Inpatient Medications    Scheduled Meds: . amLODipine  2.5 mg Oral QHS  . aspirin EC  81 mg Oral Daily  . [START ON 06/28/2019] epinephrine  0-10 mcg/min Intravenous To OR  . [START ON 06/28/2019] heparin-papaverine-plasmalyte irrigation   Irrigation To OR  . [START ON 06/28/2019] insulin   Intravenous To OR  . [START ON 06/28/2019] magnesium sulfate  40 mEq Other To OR  . metoprolol tartrate  12.5 mg Oral BID  . pantoprazole  40 mg Oral QHS  . [START ON 06/28/2019] phenylephrine  30-200 mcg/min Intravenous To OR  . [START ON 06/28/2019] potassium chloride  80 mEq Other To OR  . rosuvastatin  40 mg Oral Daily  . sodium chloride flush  3 mL Intravenous Q12H  . [START ON 06/28/2019] tranexamic acid  15 mg/kg Intravenous To OR  . [START ON 06/28/2019] tranexamic acid  2 mg/kg Intracatheter To OR   Continuous Infusions: . sodium chloride    . [START ON 06/28/2019] cefUROXime (ZINACEF)  IV    . [START ON 06/28/2019] cefUROXime (ZINACEF)  IV    . [START ON 06/28/2019] dexmedetomidine    . [START ON 06/28/2019] DOPamine    . [START ON 06/28/2019] heparin 30,000 units/NS 1000 mL solution for CELLSAVER    . heparin 750 Units/hr (06/26/19 0841)  . [START ON 06/28/2019] milrinone    . [START ON 06/28/2019] nitroGLYCERIN    . [START ON 06/28/2019] tranexamic acid (CYKLOKAPRON) infusion ([facility])    . [START ON 06/28/2019] vancomycin     PRN Meds: sodium chloride, acetaminophen, morphine injection, nitroGLYCERIN, ondansetron (ZOFRAN) IV, sodium chloride flush   Vital Signs    Vitals:   06/26/19 2344 06/27/19 0430 06/27/19 0735 06/27/19 1126  BP: (!) 119/59 (!) 100/54 122/60 (!) 115/55  Pulse: 63 67 79 64  Resp: 16  (!) 22 16 13   Temp: 98.1 F (36.7 C) 97.7 F (36.5 C) 97.7 F (36.5 C) 98.3 F (36.8 C)  TempSrc: Oral Oral Oral Oral  SpO2: 98% 98% 100% 99%  Weight:      Height:        Intake/Output Summary (Last 24 hours) at 06/27/2019 1438 Last data filed at 06/27/2019 1300 Gross per 24 hour  Intake 899.4 ml  Output -  Net 899.4 ml   Last 3 Weights 06/24/2019 06/21/2019 06/15/2019  Weight (lbs) 117 lb 118 lb 118 lb 12.8 oz  Weight (kg) 53.071 kg 53.524 kg 53.887 kg      Telemetry    Normal sinus rhythm- Personally Reviewed  ECG    N/A  Physical Exam   General appearance: alert and no distress Lungs: clear to auscultation bilaterally Heart: regular rate and rhythm, S1, S2 normal, no murmur, click, rub or gallop Extremities: extremities normal, atraumatic, no cyanosis or edema Neurologic: Grossly normal  Labs    High Sensitivity Troponin:  No results for input(s): TROPONINIHS in the last 720 hours.    Chemistry Recent Labs  Lab 06/21/19 1436 06/25/19 0216 06/27/19 0221  NA 143 138 139  K 4.7 4.2 3.7  CL 103 108 108  CO2 26 23 24   GLUCOSE 102* 101* 115*  BUN 15 17 12   CREATININE 0.79 0.77 0.68  CALCIUM  10.3 8.5* 8.6*  GFRNONAA 72 >60 >60  GFRAA 84 >60 >60  ANIONGAP  --  7 7     Hematology Recent Labs  Lab 06/25/19 0216 06/26/19 0213 06/27/19 0221  WBC 5.8 6.2 6.3  RBC 3.76* 3.66* 3.81*  HGB 11.3* 11.1* 11.5*  HCT 35.2* 34.1* 35.1*  MCV 93.6 93.2 92.1  MCH 30.1 30.3 30.2  MCHC 32.1 32.6 32.8  RDW 13.2 13.3 13.2  PLT 248 234 229    BNPNo results for input(s): BNP, PROBNP in the last 168 hours.   DDimer No results for input(s): DDIMER in the last 168 hours.   Radiology    No results found.  Cardiac Studies   Cardiac cath 06/24/2019: Diagnostic Dominance: Right  Intervention   Proximal to distal LAD calcified with moderate nonobstructive disease up to 40 to 50% in multiple sites.  Circumflex with lucent 50% eccentric proximal stenosis.   Stent is widely patent.  RCA is diffusely diseased with diffuse in-stent restenosis in the region of overlap of stents.  Unable to cross with wire.  Patient Profile     78 y.o. female with unstable angina, hypertension, coronary artery disease with most recent cardiac catheterization on 01/04/2019 revealing normal LV function, 90% circumflex treated with mid circumflex drug-eluting stent x1 Ellyn Hack).  Staged atherectomy and drug-eluting stent x2 to her mid and distal RCA on 01/06/2019 by Dr. Angelena Form.  Presented with diffuse RCA in-stent restenosis and inability to cross with wire likely related to underexpanded stents within the mid RCA stented segment.  Has been seen by Dr. Cyndia Bent and plan for single-vessel bypass Monday.  Plavix washout is underway.  Assessment & Plan    1. Coronary artery disease with early right coronary stent failure and heavily calcified RCA.  Plan single-vessel coronary bypass Monday.  Nonobstructive CAD involving LAD, left main, and circumflex.  Continue IV heparin.  IV nitroglycerin if recurrent chest pain. 2. Hyperlipidemia on Crestor 40 mg daily. LDL 41 at goal. Continue current meds. 3. Essential hypertension on metoprolol and amlodipine. BP controlled  For questions or updates, please contact [place] Please consult [email or web address] for contact info under   Pixie Casino, MD, FACC, [place] Director of the [facility] Diplomate of the American Board of Clinical Lipidology Attending Cardiologist  Direct Dial: [phone]  Fax: [phone]  Website:  [email or web address]  Pixie Casino, MD  06/27/2019, 2:38 PM

## 2019-06-28 ENCOUNTER — Inpatient Hospital Stay (HOSPITAL_COMMUNITY): Payer: Medicare HMO | Admitting: Anesthesiology

## 2019-06-28 ENCOUNTER — Encounter (HOSPITAL_COMMUNITY): Payer: Self-pay | Admitting: Anesthesiology

## 2019-06-28 ENCOUNTER — Inpatient Hospital Stay (HOSPITAL_COMMUNITY): Payer: Medicare HMO

## 2019-06-28 ENCOUNTER — Other Ambulatory Visit: Payer: Self-pay

## 2019-06-28 ENCOUNTER — Encounter (HOSPITAL_COMMUNITY)
Admission: AD | Disposition: A | Discharge status: Home / Self Care | Payer: Self-pay | Source: Home / Self Care | Attending: Surgery

## 2019-06-28 DIAGNOSIS — I25110 Atherosclerotic heart disease of native coronary artery with unstable angina pectoris: Secondary | ICD-10-CM

## 2019-06-28 DIAGNOSIS — Z951 Presence of aortocoronary bypass graft: Secondary | ICD-10-CM

## 2019-06-28 HISTORY — PX: CORONARY ARTERY BYPASS GRAFT: SHX141

## 2019-06-28 HISTORY — PX: TEE WITHOUT CARDIOVERSION: SHX5443

## 2019-06-28 LAB — POCT I-STAT 7, (LYTES, BLD GAS, ICA,H+H)
Acid-Base Excess: 1 mmol/L (ref 0.0–2.0)
Acid-Base Excess: 1 mmol/L (ref 0.0–2.0)
Bicarbonate: 24.5 mmol/L (ref 20.0–28.0)
Bicarbonate: 25 mmol/L (ref 20.0–28.0)
Calcium, Ion: 0.89 mmol/L — CL (ref 1.15–1.40)
Calcium, Ion: 1.1 mmol/L — ABNORMAL LOW (ref 1.15–1.40)
HCT: 20 % — ABNORMAL LOW (ref 36.0–46.0)
HCT: 26 % — ABNORMAL LOW (ref 36.0–46.0)
Hemoglobin: 6.8 g/dL — CL (ref 12.0–15.0)
Hemoglobin: 8.8 g/dL — ABNORMAL LOW (ref 12.0–15.0)
O2 Saturation: 100 %
O2 Saturation: 100 %
Potassium: 3.8 mmol/L (ref 3.5–5.1)
Potassium: 4.2 mmol/L (ref 3.5–5.1)
Sodium: 140 mmol/L (ref 135–145)
Sodium: 140 mmol/L (ref 135–145)
TCO2: 25 mmol/L (ref 22–32)
TCO2: 26 mmol/L (ref 22–32)
pCO2 arterial: 30.6 mmHg — ABNORMAL LOW (ref 32.0–48.0)
pCO2 arterial: 38.1 mmHg (ref 32.0–48.0)
pH, Arterial: 7.512 — ABNORMAL HIGH (ref 7.350–7.450)
pH, Arterial: 7.426 (ref 7.350–7.450)
pO2, Arterial: 419 mmHg — ABNORMAL HIGH (ref 83.0–108.0)
pO2, Arterial: 523 mmHg — ABNORMAL HIGH (ref 83.0–108.0)

## 2019-06-28 LAB — BLOOD GAS, ARTERIAL
Acid-Base Excess: 1.1 mmol/L (ref 0.0–2.0)
Bicarbonate: 25.3 mmol/L (ref 20.0–28.0)
FIO2: 0.21
O2 Saturation: 96.3 %
Patient temperature: 98.6
pCO2 arterial: 40.9 mmHg (ref 32.0–48.0)
pH, Arterial: 7.409 (ref 7.350–7.450)
pO2, Arterial: 80.7 mmHg — ABNORMAL LOW (ref 83.0–108.0)

## 2019-06-28 LAB — HEMOGLOBIN A1C
Hgb A1c MFr Bld: 5.7 % — ABNORMAL HIGH (ref 4.8–5.6)
Mean Plasma Glucose: 116.89 mg/dL

## 2019-06-28 LAB — CBC
HCT: 34.3 % — ABNORMAL LOW (ref 36.0–46.0)
HCT: 35.3 % — ABNORMAL LOW (ref 36.0–46.0)
HCT: 36.4 % (ref 36.0–46.0)
Hemoglobin: 11.4 g/dL — ABNORMAL LOW (ref 12.0–15.0)
Hemoglobin: 11.6 g/dL — ABNORMAL LOW (ref 12.0–15.0)
Hemoglobin: 12.2 g/dL (ref 12.0–15.0)
MCH: 30.7 pg (ref 26.0–34.0)
MCH: 29.5 pg (ref 26.0–34.0)
MCH: 29.8 pg (ref 26.0–34.0)
MCHC: 33.2 g/dL (ref 30.0–36.0)
MCHC: 32.9 g/dL (ref 30.0–36.0)
MCHC: 33.5 g/dL (ref 30.0–36.0)
MCV: 92.5 fL (ref 80.0–100.0)
MCV: 89.8 fL (ref 80.0–100.0)
MCV: 88.8 fL (ref 80.0–100.0)
Platelets: 213 10*3/uL (ref 150–400)
Platelets: 154 10*3/uL (ref 150–400)
Platelets: 184 10*3/uL (ref 150–400)
RBC: 3.71 MIL/uL — ABNORMAL LOW (ref 3.87–5.11)
RBC: 3.93 MIL/uL (ref 3.87–5.11)
RBC: 4.1 MIL/uL (ref 3.87–5.11)
RDW: 13.2 % (ref 11.5–15.5)
RDW: 14.8 % (ref 11.5–15.5)
RDW: 15.3 % (ref 11.5–15.5)
WBC: 5.7 10*3/uL (ref 4.0–10.5)
WBC: 15.3 10*3/uL — ABNORMAL HIGH (ref 4.0–10.5)
WBC: 18.2 10*3/uL — ABNORMAL HIGH (ref 4.0–10.5)
nRBC: 0 % (ref 0.0–0.2)
nRBC: 0 % (ref 0.0–0.2)
nRBC: 0 % (ref 0.0–0.2)

## 2019-06-28 LAB — BASIC METABOLIC PANEL
Anion gap: 8 (ref 5–15)
Anion gap: 8 (ref 5–15)
BUN: 10 mg/dL (ref 8–23)
BUN: 11 mg/dL (ref 8–23)
CO2: 24 mmol/L (ref 22–32)
CO2: 23 mmol/L (ref 22–32)
Calcium: 8.9 mg/dL (ref 8.9–10.3)
Calcium: 7.7 mg/dL — ABNORMAL LOW (ref 8.9–10.3)
Chloride: 109 mmol/L (ref 98–111)
Chloride: 109 mmol/L (ref 98–111)
Creatinine, Ser: 0.71 mg/dL (ref 0.44–1.00)
Creatinine, Ser: 0.6 mg/dL (ref 0.44–1.00)
GFR calc Af Amer: >60 mL/min (ref >60)
GFR calc Af Amer: >60 mL/min (ref >60)
GFR calc non Af Amer: >60 mL/min (ref >60)
GFR calc non Af Amer: >60 mL/min (ref >60)
Glucose, Bld: 109 mg/dL — ABNORMAL HIGH (ref 70–99)
Glucose, Bld: 111 mg/dL — ABNORMAL HIGH (ref 70–99)
Potassium: 3.6 mmol/L (ref 3.5–5.1)
Potassium: 4 mmol/L (ref 3.5–5.1)
Sodium: 141 mmol/L (ref 135–145)
Sodium: 140 mmol/L (ref 135–145)

## 2019-06-28 LAB — POCT I-STAT, CHEM 8
BUN: 10 mg/dL (ref 8–23)
BUN: 9 mg/dL (ref 8–23)
BUN: 8 mg/dL (ref 8–23)
BUN: 9 mg/dL (ref 8–23)
Calcium, Ion: 1.2 mmol/L (ref 1.15–1.40)
Calcium, Ion: 1.22 mmol/L (ref 1.15–1.40)
Calcium, Ion: 1.01 mmol/L — ABNORMAL LOW (ref 1.15–1.40)
Calcium, Ion: 1.11 mmol/L — ABNORMAL LOW (ref 1.15–1.40)
Chloride: 106 mmol/L (ref 98–111)
Chloride: 105 mmol/L (ref 98–111)
Chloride: 103 mmol/L (ref 98–111)
Chloride: 104 mmol/L (ref 98–111)
Creatinine, Ser: 0.5 mg/dL (ref 0.44–1.00)
Creatinine, Ser: 0.5 mg/dL (ref 0.44–1.00)
Creatinine, Ser: 0.4 mg/dL — ABNORMAL LOW (ref 0.44–1.00)
Creatinine, Ser: 0.4 mg/dL — ABNORMAL LOW (ref 0.44–1.00)
Glucose, Bld: 157 mg/dL — ABNORMAL HIGH (ref 70–99)
Glucose, Bld: 184 mg/dL — ABNORMAL HIGH (ref 70–99)
Glucose, Bld: 164 mg/dL — ABNORMAL HIGH (ref 70–99)
Glucose, Bld: 178 mg/dL — ABNORMAL HIGH (ref 70–99)
HCT: 36 % (ref 36.0–46.0)
HCT: 29 % — ABNORMAL LOW (ref 36.0–46.0)
HCT: 24 % — ABNORMAL LOW (ref 36.0–46.0)
HCT: 26 % — ABNORMAL LOW (ref 36.0–46.0)
Hemoglobin: 12.2 g/dL (ref 12.0–15.0)
Hemoglobin: 9.9 g/dL — ABNORMAL LOW (ref 12.0–15.0)
Hemoglobin: 8.2 g/dL — ABNORMAL LOW (ref 12.0–15.0)
Hemoglobin: 8.8 g/dL — ABNORMAL LOW (ref 12.0–15.0)
Potassium: 4 mmol/L (ref 3.5–5.1)
Potassium: 3.6 mmol/L (ref 3.5–5.1)
Potassium: 4.2 mmol/L (ref 3.5–5.1)
Potassium: 5.5 mmol/L — ABNORMAL HIGH (ref 3.5–5.1)
Sodium: 139 mmol/L (ref 135–145)
Sodium: 138 mmol/L (ref 135–145)
Sodium: 138 mmol/L (ref 135–145)
Sodium: 140 mmol/L (ref 135–145)
TCO2: 27 mmol/L (ref 22–32)
TCO2: 24 mmol/L (ref 22–32)
TCO2: 24 mmol/L (ref 22–32)
TCO2: 25 mmol/L (ref 22–32)

## 2019-06-28 LAB — PROTIME-INR
INR: 1 (ref 0.8–1.2)
INR: 1.4 — ABNORMAL HIGH (ref 0.8–1.2)
Prothrombin Time: 13 seconds (ref 11.4–15.2)
Prothrombin Time: 16.9 seconds — ABNORMAL HIGH (ref 11.4–15.2)

## 2019-06-28 LAB — SURGICAL PCR SCREEN
MRSA, PCR: NEGATIVE
Staphylococcus aureus: NEGATIVE

## 2019-06-28 LAB — GLUCOSE, CAPILLARY
Glucose-Capillary: 102 mg/dL — ABNORMAL HIGH (ref 70–99)
Glucose-Capillary: 104 mg/dL — ABNORMAL HIGH (ref 70–99)
Glucose-Capillary: 107 mg/dL — ABNORMAL HIGH (ref 70–99)
Glucose-Capillary: 107 mg/dL — ABNORMAL HIGH (ref 70–99)
Glucose-Capillary: 108 mg/dL — ABNORMAL HIGH (ref 70–99)
Glucose-Capillary: 109 mg/dL — ABNORMAL HIGH (ref 70–99)
Glucose-Capillary: 115 mg/dL — ABNORMAL HIGH (ref 70–99)
Glucose-Capillary: 115 mg/dL — ABNORMAL HIGH (ref 70–99)
Glucose-Capillary: 117 mg/dL — ABNORMAL HIGH (ref 70–99)
Glucose-Capillary: 146 mg/dL — ABNORMAL HIGH (ref 70–99)
Glucose-Capillary: 96 mg/dL (ref 70–99)
Glucose-Capillary: 97 mg/dL (ref 70–99)
Glucose-Capillary: 98 mg/dL (ref 70–99)

## 2019-06-28 LAB — APTT
aPTT: 107 seconds — ABNORMAL HIGH (ref 24–36)
aPTT: 29 seconds (ref 24–36)

## 2019-06-28 LAB — PLATELET COUNT: Platelets: 167 10*3/uL (ref 150–400)

## 2019-06-28 LAB — HEMOGLOBIN AND HEMATOCRIT, BLOOD
HCT: 25.3 % — ABNORMAL LOW (ref 36.0–46.0)
Hemoglobin: 8.3 g/dL — ABNORMAL LOW (ref 12.0–15.0)

## 2019-06-28 LAB — MAGNESIUM: Magnesium: 3.3 mg/dL — ABNORMAL HIGH (ref 1.7–2.4)

## 2019-06-28 LAB — PREPARE RBC (CROSSMATCH)

## 2019-06-28 IMAGING — DX DG CHEST 1V PORT
1 series · 1 of 1 positions shown · non-contrast
Comparison: [DATE]

CLINICAL DATA: Status post CABG.

EXAM:
PORTABLE CHEST 1 VIEW

[chest ap]
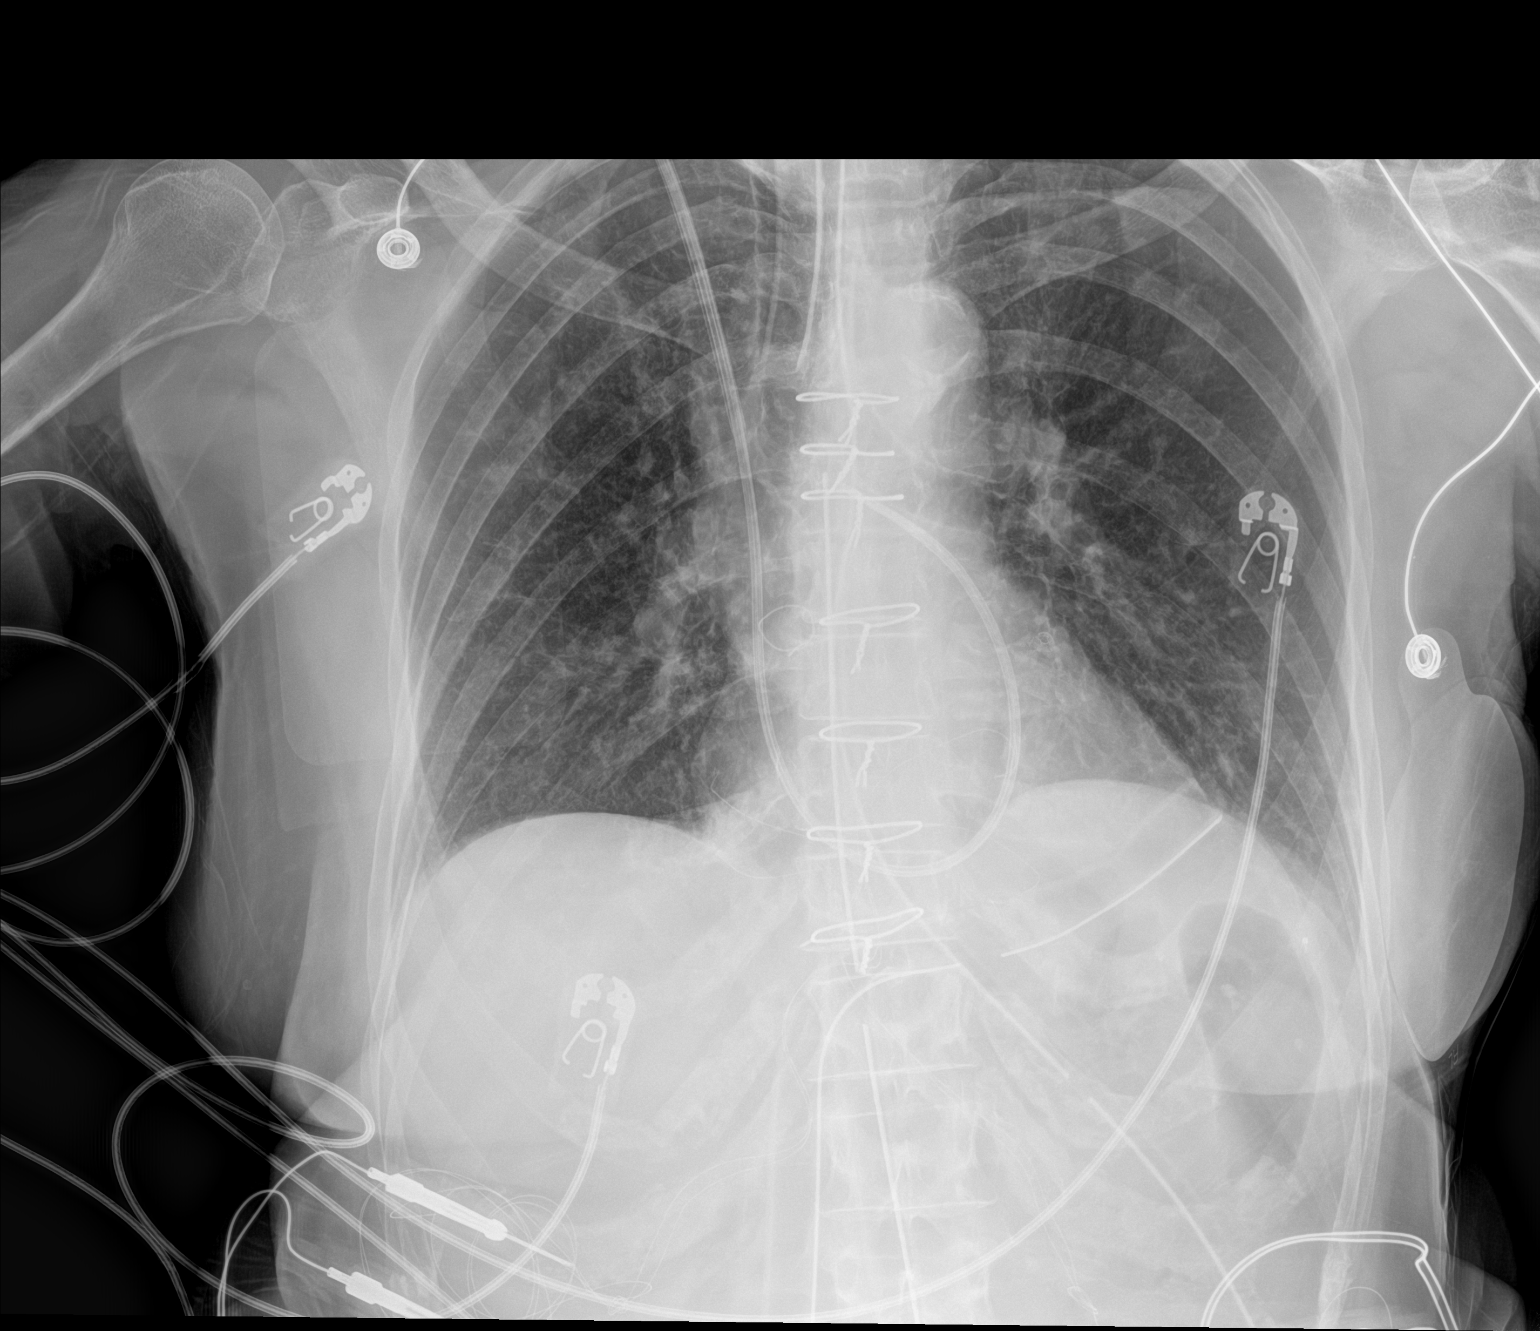

[1 of 1 positions shown; findings below may reference images not displayed]

FINDINGS: [A0] hours. Endotracheal tube tip is 2.5 cm above the base of the
carina. Right IJ pulmonary artery catheter tip is in the right main
pulmonary artery. Mediastinal/pericardial drains noted near the
midline. The NG tube passes into the stomach although the distal tip
position is not included on the film.

The lungs are clear without focal pneumonia, edema, pneumothorax or
pleural effusion. The visualized bony structures of the thorax are
intact. Insert wire
IMPRESSION: 1. Status post CABG. No evidence for pneumothorax or pleural
effusion.
2. Endotracheal tube tip is 2.5 cm above the base of the carina.

## 2019-06-28 SURGERY — CORONARY ARTERY BYPASS GRAFTING (CABG)
Anesthesia: General | Site: Chest

## 2019-06-28 MED ORDER — LACTATED RINGERS IV SOLN
INTRAVENOUS | Status: DC | PRN
  Administered 2019-06-28: 07:00:00 via INTRAVENOUS

## 2019-06-28 MED ORDER — SODIUM CHLORIDE 0.9 % IV SOLN: 250.0000 mL | INTRAVENOUS | Status: DC

## 2019-06-28 MED ORDER — SODIUM CHLORIDE 0.45 % IV SOLN
INTRAVENOUS | Status: DC | PRN
  Administered 2019-06-28: 11:00:00 via INTRAVENOUS

## 2019-06-28 MED ORDER — 0.9 % SODIUM CHLORIDE (POUR BTL) OPTIME
TOPICAL | Status: DC | PRN
  Administered 2019-06-28: 6000 mL

## 2019-06-28 MED ORDER — LACTATED RINGERS IV SOLN
INTRAVENOUS | Status: DC
  Administered 2019-06-29: 19:00:00 via INTRAVENOUS

## 2019-06-28 MED ORDER — PROPOFOL 10 MG/ML IV BOLUS
INTRAVENOUS | Status: AC
  Filled 2019-06-28: qty 20

## 2019-06-28 MED ORDER — ASPIRIN 81 MG PO CHEW: 324.0000 mg | CHEWABLE_TABLET | Freq: Every day | ORAL | Status: DC

## 2019-06-28 MED ORDER — INSULIN REGULAR(HUMAN) IN NACL 100-0.9 UT/100ML-% IV SOLN: INTRAVENOUS | Status: DC

## 2019-06-28 MED ORDER — ARTIFICIAL TEARS OPHTHALMIC OINT
TOPICAL_OINTMENT | OPHTHALMIC | Status: DC | PRN
  Administered 2019-06-28: 1 via OPHTHALMIC

## 2019-06-28 MED ORDER — MIDAZOLAM HCL 5 MG/5ML IJ SOLN
INTRAMUSCULAR | Status: DC | PRN
  Administered 2019-06-28: 1 mg via INTRAVENOUS
  Administered 2019-06-28: 2 mg via INTRAVENOUS
  Administered 2019-06-28: 3 mg via INTRAVENOUS

## 2019-06-28 MED ORDER — BISACODYL 5 MG PO TBEC
10.0000 mg | DELAYED_RELEASE_TABLET | Freq: Every day | ORAL | Status: DC
  Administered 2019-06-29 – 2019-06-30 (×2): 10 mg via ORAL
  Filled 2019-06-28 (×2): qty 2

## 2019-06-28 MED ORDER — LACTATED RINGERS IV SOLN
500.0000 mL | Freq: Once | INTRAVENOUS | Status: AC | PRN
  Administered 2019-06-28: 500 mL via INTRAVENOUS

## 2019-06-28 MED ORDER — DEXAMETHASONE SODIUM PHOSPHATE 10 MG/ML IJ SOLN
INTRAMUSCULAR | Status: AC
  Filled 2019-06-28: qty 1

## 2019-06-28 MED ORDER — CHLORHEXIDINE GLUCONATE 0.12% ORAL RINSE (MEDLINE KIT): 15.0000 mL | Freq: Two times a day (BID) | OROMUCOSAL | Status: DC

## 2019-06-28 MED ORDER — PLASMA-LYTE 148 IV SOLN
INTRAVENOUS | Status: DC | PRN
  Administered 2019-06-28: 500 mL via INTRAVASCULAR

## 2019-06-28 MED ORDER — POTASSIUM CHLORIDE 10 MEQ/50ML IV SOLN
10.0000 meq | INTRAVENOUS | Status: AC
  Administered 2019-06-28 (×3): 10 meq via INTRAVENOUS
  Filled 2019-06-28: qty 50

## 2019-06-28 MED ORDER — ALBUMIN HUMAN 5 % IV SOLN
250.0000 mL | INTRAVENOUS | Status: DC | PRN
  Administered 2019-06-28 – 2019-06-29 (×3): 12.5 g via INTRAVENOUS
  Filled 2019-06-28: qty 250

## 2019-06-28 MED ORDER — ACETAMINOPHEN 160 MG/5ML PO SOLN
1000.0000 mg | Freq: Four times a day (QID) | ORAL | Status: DC
  Administered 2019-06-28 – 2019-06-29 (×2): 1000 mg
  Filled 2019-06-28 (×2): qty 40.6

## 2019-06-28 MED ORDER — PROTAMINE SULFATE 10 MG/ML IV SOLN
INTRAVENOUS | Status: AC
  Filled 2019-06-28: qty 25

## 2019-06-28 MED ORDER — ROCURONIUM BROMIDE 10 MG/ML (PF) SYRINGE
PREFILLED_SYRINGE | INTRAVENOUS | Status: AC
  Filled 2019-06-28: qty 10

## 2019-06-28 MED ORDER — ACETAMINOPHEN 650 MG RE SUPP
650.0000 mg | Freq: Once | RECTAL | Status: AC
  Administered 2019-06-28: 11:00:00 650 mg via RECTAL

## 2019-06-28 MED ORDER — INSULIN ASPART 100 UNIT/ML ~~LOC~~ SOLN
0.0000 [IU] | SUBCUTANEOUS | Status: DC
  Administered 2019-06-29: 2 [IU] via SUBCUTANEOUS

## 2019-06-28 MED ORDER — LACTATED RINGERS IV SOLN: INTRAVENOUS | Status: DC

## 2019-06-28 MED ORDER — DOCUSATE SODIUM 100 MG PO CAPS
200.0000 mg | ORAL_CAPSULE | Freq: Every day | ORAL | Status: DC
  Administered 2019-06-29 – 2019-06-30 (×2): 200 mg via ORAL
  Filled 2019-06-28 (×2): qty 2

## 2019-06-28 MED ORDER — ROCURONIUM BROMIDE 10 MG/ML (PF) SYRINGE
PREFILLED_SYRINGE | INTRAVENOUS | Status: DC | PRN
  Administered 2019-06-28: 60 mg via INTRAVENOUS
  Administered 2019-06-28: 20 mg via INTRAVENOUS

## 2019-06-28 MED ORDER — PHENYLEPHRINE HCL-NACL 20-0.9 MG/250ML-% IV SOLN
0.0000 ug/min | INTRAVENOUS | Status: DC
  Administered 2019-06-29: 2 ug/min via INTRAVENOUS

## 2019-06-28 MED ORDER — CHLORHEXIDINE GLUCONATE 0.12 % MT SOLN
15.0000 mL | OROMUCOSAL | Status: AC
  Administered 2019-06-28: 15 mL via OROMUCOSAL

## 2019-06-28 MED ORDER — OXYCODONE HCL 5 MG PO TABS
5.0000 mg | ORAL_TABLET | ORAL | Status: DC | PRN
  Administered 2019-06-28 – 2019-06-29 (×3): 5 mg via ORAL
  Filled 2019-06-28 (×3): qty 1

## 2019-06-28 MED ORDER — MIDAZOLAM HCL 2 MG/2ML IJ SOLN: 2.0000 mg | INTRAMUSCULAR | Status: DC | PRN

## 2019-06-28 MED ORDER — FAMOTIDINE IN NACL 20-0.9 MG/50ML-% IV SOLN
20.0000 mg | Freq: Two times a day (BID) | INTRAVENOUS | Status: AC
  Administered 2019-06-28 (×2): 20 mg via INTRAVENOUS
  Filled 2019-06-28: qty 50

## 2019-06-28 MED ORDER — CHLORHEXIDINE GLUCONATE CLOTH 2 % EX PADS
6.0000 | MEDICATED_PAD | Freq: Every day | CUTANEOUS | Status: DC
  Administered 2019-06-28 – 2019-06-29 (×2): 6 via TOPICAL

## 2019-06-28 MED ORDER — FENTANYL CITRATE (PF) 250 MCG/5ML IJ SOLN
INTRAMUSCULAR | Status: AC
  Filled 2019-06-28: qty 25

## 2019-06-28 MED ORDER — HEPARIN SODIUM (PORCINE) 1000 UNIT/ML IJ SOLN
INTRAMUSCULAR | Status: DC | PRN
  Administered 2019-06-28: 20000 [IU] via INTRAVENOUS

## 2019-06-28 MED ORDER — FENTANYL CITRATE (PF) 250 MCG/5ML IJ SOLN
INTRAMUSCULAR | Status: DC | PRN
  Administered 2019-06-28: 150 ug via INTRAVENOUS
  Administered 2019-06-28: 250 ug via INTRAVENOUS
  Administered 2019-06-28: 50 ug via INTRAVENOUS
  Administered 2019-06-28: 100 ug via INTRAVENOUS
  Administered 2019-06-28: 50 ug via INTRAVENOUS

## 2019-06-28 MED ORDER — INSULIN REGULAR BOLUS VIA INFUSION
0.0000 [IU] | Freq: Three times a day (TID) | INTRAVENOUS | Status: DC
  Filled 2019-06-28: qty 10

## 2019-06-28 MED ORDER — LACTATED RINGERS IV SOLN
INTRAVENOUS | Status: DC | PRN
  Administered 2019-06-28: 06:00:00 via INTRAVENOUS

## 2019-06-28 MED ORDER — ASPIRIN EC 325 MG PO TBEC
325.0000 mg | DELAYED_RELEASE_TABLET | Freq: Every day | ORAL | Status: DC
  Administered 2019-06-29 – 2019-06-30 (×2): 325 mg via ORAL
  Filled 2019-06-28 (×2): qty 1

## 2019-06-28 MED ORDER — MIDAZOLAM HCL (PF) 10 MG/2ML IJ SOLN
INTRAMUSCULAR | Status: AC
  Filled 2019-06-28: qty 2

## 2019-06-28 MED ORDER — SODIUM CHLORIDE 0.9 % IV SOLN
1.5000 g | Freq: Two times a day (BID) | INTRAVENOUS | Status: AC
  Administered 2019-06-28 – 2019-06-30 (×4): 1.5 g via INTRAVENOUS
  Filled 2019-06-28 (×5): qty 1.5

## 2019-06-28 MED ORDER — VANCOMYCIN HCL IN DEXTROSE 1-5 GM/200ML-% IV SOLN
1000.0000 mg | Freq: Once | INTRAVENOUS | Status: AC
  Administered 2019-06-28: 1000 mg via INTRAVENOUS
  Filled 2019-06-28: qty 200

## 2019-06-28 MED ORDER — DEXAMETHASONE SODIUM PHOSPHATE 10 MG/ML IJ SOLN
INTRAMUSCULAR | Status: DC | PRN
  Administered 2019-06-28: 10 mg via INTRAVENOUS

## 2019-06-28 MED ORDER — NITROGLYCERIN IN D5W 200-5 MCG/ML-% IV SOLN: 0.0000 ug/min | INTRAVENOUS | Status: DC

## 2019-06-28 MED ORDER — ACETAMINOPHEN 160 MG/5ML PO SOLN: 650.0000 mg | Freq: Once | ORAL | Status: AC

## 2019-06-28 MED ORDER — ONDANSETRON HCL 4 MG/2ML IJ SOLN
4.0000 mg | Freq: Four times a day (QID) | INTRAMUSCULAR | Status: DC | PRN
  Administered 2019-06-28 – 2019-06-29 (×3): 4 mg via INTRAVENOUS
  Filled 2019-06-28 (×3): qty 2

## 2019-06-28 MED ORDER — MUPIROCIN 2 % EX OINT
1.0000 "application " | TOPICAL_OINTMENT | Freq: Two times a day (BID) | CUTANEOUS | Status: DC
  Administered 2019-06-28 – 2019-07-02 (×9): 1 via NASAL
  Filled 2019-06-28 (×6): qty 22

## 2019-06-28 MED ORDER — METOPROLOL TARTRATE 25 MG/10 ML ORAL SUSPENSION: 12.5000 mg | Freq: Two times a day (BID) | ORAL | Status: DC

## 2019-06-28 MED ORDER — DEXMEDETOMIDINE HCL IN NACL 400 MCG/100ML IV SOLN: 0.0000 ug/kg/h | INTRAVENOUS | Status: DC

## 2019-06-28 MED ORDER — MAGNESIUM SULFATE 4 GM/100ML IV SOLN
4.0000 g | Freq: Once | INTRAVENOUS | Status: AC
  Administered 2019-06-28: 4 g via INTRAVENOUS
  Filled 2019-06-28: qty 100

## 2019-06-28 MED ORDER — METOPROLOL TARTRATE 12.5 MG HALF TABLET
12.5000 mg | ORAL_TABLET | Freq: Two times a day (BID) | ORAL | Status: DC
  Administered 2019-06-28: 12.5 mg via ORAL
  Filled 2019-06-28: qty 1

## 2019-06-28 MED ORDER — SUCCINYLCHOLINE CHLORIDE 20 MG/ML IJ SOLN
INTRAMUSCULAR | Status: DC | PRN
  Administered 2019-06-28: 100 mg via INTRAVENOUS

## 2019-06-28 MED ORDER — SUCCINYLCHOLINE CHLORIDE 200 MG/10ML IV SOSY
PREFILLED_SYRINGE | INTRAVENOUS | Status: AC
  Filled 2019-06-28: qty 10

## 2019-06-28 MED ORDER — EPHEDRINE SULFATE 50 MG/ML IJ SOLN
INTRAMUSCULAR | Status: DC | PRN
  Administered 2019-06-28: 15 mg via INTRAVENOUS

## 2019-06-28 MED ORDER — ACETAMINOPHEN 500 MG PO TABS
1000.0000 mg | ORAL_TABLET | Freq: Four times a day (QID) | ORAL | Status: DC
  Administered 2019-06-29 – 2019-06-30 (×6): 1000 mg via ORAL
  Filled 2019-06-28 (×5): qty 2

## 2019-06-28 MED ORDER — HEPARIN SODIUM (PORCINE) 1000 UNIT/ML IJ SOLN
INTRAMUSCULAR | Status: AC
  Filled 2019-06-28: qty 1

## 2019-06-28 MED ORDER — MORPHINE SULFATE (PF) 2 MG/ML IV SOLN: 1.0000 mg | INTRAVENOUS | Status: DC | PRN

## 2019-06-28 MED ORDER — SODIUM CHLORIDE 0.9 % IV SOLN
INTRAVENOUS | Status: DC | PRN
  Administered 2019-06-28: 15 ug/min via INTRAVENOUS

## 2019-06-28 MED ORDER — HEMOSTATIC AGENTS (NO CHARGE) OPTIME
TOPICAL | Status: DC | PRN
  Administered 2019-06-28 (×4): 1 via TOPICAL

## 2019-06-28 MED ORDER — THROMBIN (RECOMBINANT) 20000 UNITS EX SOLR
CUTANEOUS | Status: AC
  Filled 2019-06-28: qty 20000

## 2019-06-28 MED ORDER — SODIUM CHLORIDE 0.9% FLUSH
3.0000 mL | Freq: Two times a day (BID) | INTRAVENOUS | Status: DC
  Administered 2019-06-29: 3 mL via INTRAVENOUS
  Administered 2019-06-29: 22:00:00 via INTRAVENOUS
  Administered 2019-06-30: 3 mL via INTRAVENOUS

## 2019-06-28 MED ORDER — ONDANSETRON HCL 4 MG/2ML IJ SOLN
INTRAMUSCULAR | Status: AC
  Filled 2019-06-28: qty 2

## 2019-06-28 MED ORDER — THROMBIN 20000 UNITS EX SOLR
CUTANEOUS | Status: DC | PRN
  Administered 2019-06-28: 20000 [IU] via TOPICAL

## 2019-06-28 MED ORDER — EPHEDRINE 5 MG/ML INJ
INTRAVENOUS | Status: AC
  Filled 2019-06-28: qty 10

## 2019-06-28 MED ORDER — BISACODYL 10 MG RE SUPP: 10.0000 mg | Freq: Every day | RECTAL | Status: DC

## 2019-06-28 MED ORDER — PANTOPRAZOLE SODIUM 40 MG PO TBEC
40.0000 mg | DELAYED_RELEASE_TABLET | Freq: Every day | ORAL | Status: DC
  Administered 2019-06-30: 40 mg via ORAL
  Filled 2019-06-28: qty 1

## 2019-06-28 MED ORDER — SODIUM CHLORIDE 0.9 % IV SOLN
INTRAVENOUS | Status: DC
  Administered 2019-06-28: 11:00:00 via INTRAVENOUS

## 2019-06-28 MED ORDER — SODIUM CHLORIDE 0.9% FLUSH: 3.0000 mL | INTRAVENOUS | Status: DC | PRN

## 2019-06-28 MED ORDER — METOPROLOL TARTRATE 5 MG/5ML IV SOLN: 2.5000 mg | INTRAVENOUS | Status: DC | PRN

## 2019-06-28 MED ORDER — TRAMADOL HCL 50 MG PO TABS
50.0000 mg | ORAL_TABLET | ORAL | Status: DC | PRN
  Administered 2019-06-28: 100 mg via ORAL
  Filled 2019-06-28: qty 2

## 2019-06-28 MED ORDER — ORAL CARE MOUTH RINSE
15.0000 mL | OROMUCOSAL | Status: DC
  Administered 2019-06-28 (×2): 15 mL via OROMUCOSAL

## 2019-06-28 MED ORDER — PROTAMINE SULFATE 10 MG/ML IV SOLN
INTRAVENOUS | Status: DC | PRN
  Administered 2019-06-28: 200 mg via INTRAVENOUS

## 2019-06-28 MED ORDER — SODIUM BICARBONATE 8.4 % IV SOLN
50.0000 meq | Freq: Once | INTRAVENOUS | Status: AC
  Administered 2019-06-28: 50 meq via INTRAVENOUS

## 2019-06-28 MED ORDER — PROPOFOL 10 MG/ML IV BOLUS
INTRAVENOUS | Status: DC | PRN
  Administered 2019-06-28: 50 mg via INTRAVENOUS
  Administered 2019-06-28: 100 mg via INTRAVENOUS

## 2019-06-28 MED ORDER — ORAL CARE MOUTH RINSE
15.0000 mL | Freq: Two times a day (BID) | OROMUCOSAL | Status: DC
  Administered 2019-06-28 – 2019-06-30 (×5): 15 mL via OROMUCOSAL

## 2019-06-28 MED FILL — Electrolyte-148 Solution: INTRAVENOUS | Qty: 500 | Status: AC

## 2019-06-28 MED FILL — Papaverine HCl Inj 30 MG/ML: INTRAMUSCULAR | Qty: 1 | Status: AC

## 2019-06-28 MED FILL — Heparin Sodium (Porcine) Inj 1000 Unit/ML: INTRAMUSCULAR | Qty: 2.5 | Status: AC

## 2019-06-28 SURGICAL SUPPLY — 110 items
ADH SKN CLS APL DERMABOND .7 (GAUZE/BANDAGES/DRESSINGS) ×2
BAG DECANTER FOR FLEXI CONT (MISCELLANEOUS) ×3 IMPLANT
BASKET HEART (ORDER IN 25'S) (MISCELLANEOUS) ×1
BASKET HEART (ORDER IN 25S) (MISCELLANEOUS) ×2 IMPLANT
BLADE CLIPPER SURG (BLADE) ×2 IMPLANT
BLADE STERNUM SYSTEM 6 (BLADE) ×3 IMPLANT
BNDG ELASTIC 4X5.8 VLCR STR LF (GAUZE/BANDAGES/DRESSINGS) ×3 IMPLANT
BNDG ELASTIC 6X5.8 VLCR STR LF (GAUZE/BANDAGES/DRESSINGS) ×3 IMPLANT
BNDG GAUZE ELAST 4 BULKY (GAUZE/BANDAGES/DRESSINGS) ×3 IMPLANT
CANISTER SUCT 3000ML PPV (MISCELLANEOUS) ×3 IMPLANT
CATH ROBINSON RED A/P 18FR (CATHETERS) ×6 IMPLANT
CATH THORACIC 28FR (CATHETERS) ×2 IMPLANT
CATH THORACIC 36FR (CATHETERS) ×3 IMPLANT
CATH THORACIC 36FR RT ANG (CATHETERS) ×3 IMPLANT
CLIP VESOCCLUDE MED 24/CT (CLIP) IMPLANT
CLIP VESOCCLUDE SM WIDE 24/CT (CLIP) ×2 IMPLANT
COVER WAND RF STERILE (DRAPES) ×2 IMPLANT
DERMABOND ADVANCED (GAUZE/BANDAGES/DRESSINGS) ×1
DERMABOND ADVANCED .7 DNX12 (GAUZE/BANDAGES/DRESSINGS) IMPLANT
DRAPE CARDIOVASCULAR INCISE (DRAPES) ×3
DRAPE SLUSH/WARMER DISC (DRAPES) ×3 IMPLANT
DRAPE SRG 135X102X78XABS (DRAPES) ×2 IMPLANT
DRSG COVADERM 4X14 (GAUZE/BANDAGES/DRESSINGS) ×3 IMPLANT
ELECT CAUTERY BLADE 6.4 (BLADE) ×3 IMPLANT
ELECT REM PT RETURN 9FT ADLT (ELECTROSURGICAL) ×6
ELECTRODE REM PT RTRN 9FT ADLT (ELECTROSURGICAL) ×4 IMPLANT
FELT TEFLON 1X6 (MISCELLANEOUS) ×5 IMPLANT
GAUZE SPONGE 4X4 12PLY STRL (GAUZE/BANDAGES/DRESSINGS) ×6 IMPLANT
GAUZE SPONGE 4X4 12PLY STRL LF (GAUZE/BANDAGES/DRESSINGS) ×2 IMPLANT
GLOVE BIO SURGEON STRL SZ 6 (GLOVE) IMPLANT
GLOVE BIO SURGEON STRL SZ 6.5 (GLOVE) ×2 IMPLANT
GLOVE BIO SURGEON STRL SZ7 (GLOVE) ×2 IMPLANT
GLOVE BIO SURGEON STRL SZ7.5 (GLOVE) ×1 IMPLANT
GLOVE BIOGEL PI IND STRL 6 (GLOVE) IMPLANT
GLOVE BIOGEL PI IND STRL 6.5 (GLOVE) IMPLANT
GLOVE BIOGEL PI IND STRL 7.0 (GLOVE) IMPLANT
GLOVE BIOGEL PI IND STRL 7.5 (GLOVE) IMPLANT
GLOVE BIOGEL PI IND STRL 8 (GLOVE) IMPLANT
GLOVE BIOGEL PI INDICATOR 6 (GLOVE) ×2
GLOVE BIOGEL PI INDICATOR 6.5 (GLOVE)
GLOVE BIOGEL PI INDICATOR 7.0 (GLOVE)
GLOVE BIOGEL PI INDICATOR 7.5 (GLOVE) ×1
GLOVE BIOGEL PI INDICATOR 8 (GLOVE) ×1
GLOVE EUDERMIC 7 POWDERFREE (GLOVE) ×6 IMPLANT
GLOVE ORTHO TXT STRL SZ7.5 (GLOVE) IMPLANT
GLOVE SURG SS PI 7.5 STRL IVOR (GLOVE) ×2 IMPLANT
GOWN STRL REUS W/ TWL LRG LVL3 (GOWN DISPOSABLE) ×8 IMPLANT
GOWN STRL REUS W/ TWL XL LVL3 (GOWN DISPOSABLE) ×2 IMPLANT
GOWN STRL REUS W/TWL LRG LVL3 (GOWN DISPOSABLE) ×24
GOWN STRL REUS W/TWL XL LVL3 (GOWN DISPOSABLE) ×3
HEMOSTAT POWDER SURGIFOAM 1G (HEMOSTASIS) ×9 IMPLANT
HEMOSTAT SURGICEL 2X14 (HEMOSTASIS) ×3 IMPLANT
INSERT FOGARTY 61MM (MISCELLANEOUS) IMPLANT
INSERT FOGARTY XLG (MISCELLANEOUS) IMPLANT
KIT BASIN OR (CUSTOM PROCEDURE TRAY) ×3 IMPLANT
KIT CATH CPB BARTLE (MISCELLANEOUS) ×3 IMPLANT
KIT SUCTION CATH 14FR (SUCTIONS) ×3 IMPLANT
KIT TURNOVER KIT B (KITS) ×3 IMPLANT
KIT VASOVIEW HEMOPRO 2 VH 4000 (KITS) ×3 IMPLANT
NS IRRIG 1000ML POUR BTL (IV SOLUTION) ×16 IMPLANT
PACK E OPEN HEART (SUTURE) ×3 IMPLANT
PACK OPEN HEART (CUSTOM PROCEDURE TRAY) ×3 IMPLANT
PAD ARMBOARD 7.5X6 YLW CONV (MISCELLANEOUS) ×6 IMPLANT
PAD ELECT DEFIB RADIOL ZOLL (MISCELLANEOUS) ×3 IMPLANT
PENCIL BUTTON HOLSTER BLD 10FT (ELECTRODE) ×3 IMPLANT
POSITIONER HEAD DONUT 9IN (MISCELLANEOUS) ×3 IMPLANT
PUNCH AORTIC ROTATE  4.5MM 8IN (MISCELLANEOUS) ×1 IMPLANT
PUNCH AORTIC ROTATE 4.0MM (MISCELLANEOUS) IMPLANT
PUNCH AORTIC ROTATE 4.5MM 8IN (MISCELLANEOUS) ×3 IMPLANT
PUNCH AORTIC ROTATE 5MM 8IN (MISCELLANEOUS) IMPLANT
SET CARDIOPLEGIA MPS 5001102 (MISCELLANEOUS) ×1 IMPLANT
SPONGE INTESTINAL PEANUT (DISPOSABLE) IMPLANT
SPONGE LAP 18X18 RF (DISPOSABLE) ×1 IMPLANT
SPONGE LAP 4X18 RFD (DISPOSABLE) ×2 IMPLANT
SUT BONE WAX W31G (SUTURE) ×3 IMPLANT
SUT MNCRL AB 4-0 PS2 18 (SUTURE) ×1 IMPLANT
SUT PROLENE 3 0 SH DA (SUTURE) IMPLANT
SUT PROLENE 3 0 SH1 36 (SUTURE) ×3 IMPLANT
SUT PROLENE 4 0 RB 1 (SUTURE)
SUT PROLENE 4 0 SH DA (SUTURE) IMPLANT
SUT PROLENE 4-0 RB1 .5 CRCL 36 (SUTURE) IMPLANT
SUT PROLENE 5 0 C 1 36 (SUTURE) IMPLANT
SUT PROLENE 6 0 C 1 30 (SUTURE) ×2 IMPLANT
SUT PROLENE 7 0 BV 1 (SUTURE) IMPLANT
SUT PROLENE 7 0 BV1 MDA (SUTURE) ×3 IMPLANT
SUT PROLENE 8 0 BV175 6 (SUTURE) IMPLANT
SUT SILK  1 MH (SUTURE)
SUT SILK 1 MH (SUTURE) IMPLANT
SUT SILK 2 0 SH (SUTURE) IMPLANT
SUT SILK 2 0 SH CR/8 (SUTURE) ×1 IMPLANT
SUT STEEL STERNAL CCS#1 18IN (SUTURE) ×2 IMPLANT
SUT STEEL SZ 6 DBL 3X14 BALL (SUTURE) IMPLANT
SUT VIC AB 1 CTX 36 (SUTURE) ×6
SUT VIC AB 1 CTX36XBRD ANBCTR (SUTURE) ×4 IMPLANT
SUT VIC AB 2-0 CT1 27 (SUTURE) ×3
SUT VIC AB 2-0 CT1 TAPERPNT 27 (SUTURE) IMPLANT
SUT VIC AB 2-0 CTX 27 (SUTURE) IMPLANT
SUT VIC AB 3-0 SH 27 (SUTURE)
SUT VIC AB 3-0 SH 27X BRD (SUTURE) IMPLANT
SUT VIC AB 3-0 X1 27 (SUTURE) IMPLANT
SUT VICRYL 4-0 PS2 18IN ABS (SUTURE) IMPLANT
SYSTEM SAHARA CHEST DRAIN ATS (WOUND CARE) ×3 IMPLANT
TAPE CLOTH SURG 4X10 WHT LF (GAUZE/BANDAGES/DRESSINGS) ×1 IMPLANT
TAPE PAPER 2X10 WHT MICROPORE (GAUZE/BANDAGES/DRESSINGS) ×1 IMPLANT
TOWEL GREEN STERILE (TOWEL DISPOSABLE) ×3 IMPLANT
TOWEL GREEN STERILE FF (TOWEL DISPOSABLE) ×2 IMPLANT
TRAY FOLEY SLVR 16FR TEMP STAT (SET/KITS/TRAYS/PACK) ×3 IMPLANT
TUBING LAP HI FLOW INSUFFLATIO (TUBING) ×3 IMPLANT
UNDERPAD 30X30 (UNDERPADS AND DIAPERS) ×3 IMPLANT
WATER STERILE IRR 1000ML POUR (IV SOLUTION) ×6 IMPLANT

## 2019-06-28 NOTE — Progress Notes (Signed)
  Echocardiogram Echocardiogram Transesophageal has been performed.  Rachel Valentine 06/28/2019, 8:57 AM

## 2019-06-28 NOTE — Anesthesia Procedure Notes (Signed)
Central Venous Catheter Insertion Performed by: Albertha Ghee, MD, anesthesiologist Start/End10/26/2020 6:58 AM, 06/28/2019 7:10 AM Patient location: Pre-op. Preanesthetic checklist: patient identified, IV checked, site marked, risks and benefits discussed, surgical consent, monitors and equipment checked, pre-op evaluation, timeout performed and anesthesia consent Position: Trendelenburg Lidocaine 1% used for infiltration and patient sedated Hand hygiene performed , maximum sterile barriers used  and Seldinger technique used Catheter size: 8.5 Fr Central line and PA cath was placed.Sheath introducer Swan type:thermodilation Procedure performed using ultrasound guided technique. Ultrasound Notes:anatomy identified, needle tip was noted to be adjacent to the nerve/plexus identified, no ultrasound evidence of intravascular and/or intraneural injection and image(s) printed for medical record Attempts: 1 Following insertion, line sutured, dressing applied and Biopatch. Post procedure assessment: blood return through all ports, free fluid flow and no air  Patient tolerated the procedure well with no immediate complications.

## 2019-06-28 NOTE — Anesthesia Procedure Notes (Signed)
Arterial Line Insertion Start/End10/26/2020 6:50 AM, 06/28/2019 7:00 AM Performed by: Laretta Alstrom, CRNA, CRNA  Preanesthetic checklist: patient identified, IV checked, site marked, risks and benefits discussed, surgical consent, monitors and equipment checked, pre-op evaluation, timeout performed and anesthesia consent Lidocaine 1% used for infiltration radial was placed Catheter size: 20 G Maximum sterile barriers used   Attempts: 1 Procedure performed without using ultrasound guided technique. Following insertion, dressing applied and Biopatch. Post procedure assessment: normal  Patient tolerated the procedure well with no immediate complications.

## 2019-06-28 NOTE — Transfer of Care (Signed)
Immediate Anesthesia Transfer of Care Note  Patient: Zaveah Smothermon Moorer  Procedure(s) Performed: CORONARY ARTERY BYPASS GRAFTING (CABG) using endoscopic harvest right saphenous vein to the posterior lateral (PLB). (N/A Chest) TRANSESOPHAGEAL ECHOCARDIOGRAM (TEE) (N/A )  Patient Location: SICU  Anesthesia Type:General  Level of Consciousness: sedated and Patient remains intubated per anesthesia plan  Airway & Oxygen Therapy: Patient remains intubated per anesthesia plan and Patient placed on Ventilator (see vital sign flow sheet for setting)  Post-op Assessment: Report given to RN and Post -op Vital signs reviewed and stable  Post vital signs: Reviewed and stable  Last Vitals:  Vitals Value Taken Time  BP    Temp    Pulse    Resp    SpO2      Last Pain:  Vitals:   06/28/19 0413  TempSrc: Oral  PainSc:       Patients Stated Pain Goal: 0 ([id number])  Complications: No apparent anesthesia complications

## 2019-06-28 NOTE — Op Note (Signed)
CARDIOVASCULAR SURGERY OPERATIVE NOTE  06/28/2019  Surgeon:  Gaye Pollack, MD  First Assistant: [name], PA-C   Preoperative Diagnosis:  High grade RCA restenosis.   Postoperative Diagnosis:  Same   Procedure:  1. Median Sternotomy 2. Extracorporeal circulation 3.   Coronary artery bypass grafting x 1   SVG to PDA  4.   Endoscopic vein harvest from the right leg   Anesthesia:  General Endotracheal   Clinical History/Surgical Indication:  This 78 year old woman has high-grade restenosis within a diffusely diseased right coronary artery.  She had an unsuccessful attempt at PCI today.  She is very symptomatic with minimal activity and has had some pain at rest while at home.  I agree that coronary artery bypass graft surgery is the best treatment.  She will only require a graft to the posterior descending artery.  She does have a diffusely diseased right coronary artery extending out into the posterior descending branch and I think we will have to have a graft placed to the more distal posterior descending branch.  This will require a saphenous vein graft because I do not think a right internal mammary graft will reach that far.  She has been on Plavix which was stopped today.  I will plan to do surgery on Monday. I discussed the operative procedure with the patient including alternatives, benefits and risks; including but not limited to bleeding, blood transfusion, infection, stroke, myocardial infarction, graft failure, heart block requiring a permanent pacemaker, organ dysfunction, and death.  Rachel Valentine understands and agrees to proceed.    Preparation:  The patient was seen in the preoperative holding area and the correct patient, correct operation were confirmed with the patient after reviewing the medical record and catheterization. The consent was signed by me.  Preoperative antibiotics were given. A pulmonary arterial line and radial arterial line were placed by the anesthesia team. The patient was taken back to the operating room and positioned supine on the operating room table. After being placed under general endotracheal anesthesia by the anesthesia team a foley catheter was placed. The neck, chest, abdomen, and both legs were prepped with betadine soap and solution and draped in the usual sterile manner. A surgical time-out was taken and the correct patient and operative procedure were confirmed with the nursing and anesthesia staff.   Cardiopulmonary Bypass:  A median sternotomy was performed. The pericardium was opened in the midline. Right ventricular function appeared normal. The ascending aorta was of normal size and had no palpable plaque. There were no contraindications to aortic cannulation or cross-clamping. The patient was fully systemically heparinized and the ACT was maintained > 400 sec. The proximal aortic arch was cannulated with a 20 F aortic cannula for arterial inflow. Venous cannulation was performed via the right atrial appendage using a two-staged venous cannula. An antegrade cardioplegia/vent cannula was inserted into the mid-ascending aorta. Aortic occlusion was performed with a single cross-clamp. The systemic temperature was maintained at 37 degrees. Topical cooling of the heart with iced saline were used. Hyperkalemic antegrade cold blood cardioplegia was used to induce diastolic arrest and to maintain myocardial temperature at or below 10 degrees centigrade. A temperature probe was inserted into the interventricular septum and an insulating pad was placed in the pericardium.   Endoscopic vein harvest:  The right greater saphenous vein was harvested endoscopically through a 2 cm incision medial to the right knee. It was harvested from the thigh. It was a medium-sized vein of good quality. The  side branches were all ligated with 4-0  silk ties.    Coronary arteries:  The coronary arteries were examined.   LAD:  Palpable plaque present in the proximal and mid vessel. Distal vessel with no significant disease.  LCX:  Branching OM with minimal distal disease.  RCA:  Diffusely diseased with calcific plaque extending into the proximal PDA. The mid PDA was of medium caliber and mildly diseased but graftable before the vessel became intramyocardial beneath a large PD vein.    Grafts:   1. SVG to mid PDA:  1.6 mm. It was sewn end to side using 7-0 prolene continuous suture.   The proximal vein graft anastomosis was performed to the mid-ascending aorta using continuous 6-0 prolene suture. A graft marker was placed around the proximal anastomosis.   Completion:  The patient was rewarmed to 37 degrees Centigrade. The crossclamp was removed with a time of 25 minutes. There was spontaneous return of sinus rhythm. The distal and proximal anastomoses were checked for hemostasis. The position of the grafts was satisfactory. Two temporary epicardial pacing wires were placed on the right atrium and two on the right ventricle. The patient was weaned from CPB without difficulty on no inotropes. CPB time was 38 minutes. Cardiac output was 5 LPM. Heparin was fully reversed with protamine and the aortic and venous cannulas removed. Hemostasis was achieved. Mediastinal and left pleural drainage tubes were placed. The sternum was closed with  #6 stainless steel wires. The fascia was closed with continuous # 1 vicryl suture. The subcutaneous tissue was closed with 2-0 vicryl continuous suture. The skin was closed with 3-0 vicryl subcuticular suture. All sponge, needle, and instrument counts were reported correct at the end of the case. Dry sterile dressings were placed over the incisions and around the chest tubes which were connected to pleurevac suction. The patient was then transported to the surgical intensive care unit in stable  condition.

## 2019-06-28 NOTE — Progress Notes (Signed)
Patient ID: Rachel Valentine, female   DOB: 04/11/1941, 78 y.o.   MRN: [id number] TCTS  She had a stable weekend with mild intermittent CP. Ready for CABG this am.

## 2019-06-28 NOTE — Anesthesia Preprocedure Evaluation (Signed)
Anesthesia Evaluation  Patient identified by MRN, date of birth, ID band Patient awake    Reviewed: Allergy & Precautions, H&P , NPO status , Patient's Chart, lab work & pertinent test results  Airway Mallampati: II   Neck ROM: full    Dental   Pulmonary former smoker,    breath sounds clear to auscultation       Cardiovascular + angina + CAD and + Cardiac Stents   Rhythm:regular Rate:Normal     Neuro/Psych    GI/Hepatic   Endo/Other    Renal/GU      Musculoskeletal   Abdominal   Peds  Hematology   Anesthesia Other Findings   Reproductive/Obstetrics                             Anesthesia Physical Anesthesia Plan  ASA: III  Anesthesia Plan: General   Post-op Pain Management:    Induction: Intravenous  PONV Risk Score and Plan: 3 and Ondansetron, Dexamethasone, Midazolam and Treatment may vary due to age or medical condition  Airway Management Planned: Oral ETT  Additional Equipment: Arterial line, CVP, PA Cath, Ultrasound Guidance Line Placement and TEE  Intra-op Plan:   Post-operative Plan: Post-operative intubation/ventilation  Informed Consent: I have reviewed the patients History and Physical, chart, labs and discussed the procedure including the risks, benefits and alternatives for the proposed anesthesia with the patient or authorized representative who has indicated his/her understanding and acceptance.       Plan Discussed with: CRNA, Anesthesiologist and Surgeon  Anesthesia Plan Comments:         Anesthesia Quick Evaluation

## 2019-06-28 NOTE — Plan of Care (Signed)
  Problem: Education: Goal: Knowledge of General Education information will improve Description: Including pain rating scale, medication(s)/side effects and non-pharmacologic comfort measures Outcome: Progressing   Problem: Health Behavior/Discharge Planning: Goal: Ability to manage health-related needs will improve Outcome: Progressing   Problem: Clinical Measurements: Goal: Ability to maintain clinical measurements within normal limits will improve Outcome: Progressing Goal: Will remain free from infection Outcome: Progressing Goal: Diagnostic test results will improve Outcome: Progressing Goal: Respiratory complications will improve Outcome: Progressing Goal: Cardiovascular complication will be avoided Outcome: Progressing   Problem: Nutrition: Goal: Adequate nutrition will be maintained Outcome: Progressing   Problem: Coping: Goal: Level of anxiety will decrease Outcome: Progressing   Problem: Elimination: Goal: Will not experience complications related to urinary retention Outcome: Progressing   Problem: Pain Managment: Goal: General experience of comfort will improve Outcome: Progressing   Problem: Safety: Goal: Ability to remain free from injury will improve Outcome: Progressing   Problem: Skin Integrity: Goal: Risk for impaired skin integrity will decrease Outcome: Progressing   Problem: Clinical Measurements: Goal: Postoperative complications will be avoided or minimized Outcome: Progressing   Problem: Education: Goal: Knowledge of disease or condition will improve Outcome: Progressing Goal: Knowledge of the prescribed therapeutic regimen will improve Outcome: Progressing   Problem: Cardiac: Goal: Will achieve and/or maintain hemodynamic stability Outcome: Progressing   Problem: Clinical Measurements: Goal: Postoperative complications will be avoided or minimized Outcome: Progressing   Problem: Activity: Goal: Risk for activity intolerance will  decrease Outcome: Not Progressing

## 2019-06-28 NOTE — Progress Notes (Signed)
EVENING ROUNDS NOTE :     [place]       [place] [address]             [phone]                 Day of Surgery Procedure(s) (LRB): CORONARY ARTERY BYPASS GRAFTING (CABG) using endoscopic harvest right saphenous vein to the posterior lateral (PLB). (N/A) TRANSESOPHAGEAL ECHOCARDIOGRAM (TEE) (N/A)   Total Length of Stay:  LOS: 4 days  Events:  Doing well Extubated  Off all gtts Low CT output    BP 115/66   Pulse 81   Temp (!) 95.7 F (35.4 C)   Resp 14   Ht 5\' 2"  (1.575 m)   Wt 53.1 kg   SpO2 100%   BMI 21.40 kg/m   PAP: (22-27)/(10-14) 25/12 CO:  [2.7 L/min-3.6 L/min] 2.7 L/min CI:  [1.8 L/min/m2-2.4 L/min/m2] 1.8 L/min/m2  Vent Mode: PSV;CPAP FiO2 (%):  [40 %-50 %] 40 % Set Rate:  [4 bmp-12 bmp] 4 bmp Vt Set:  [400 mL] 400 mL PEEP:  [5 cmH20] 5 cmH20 Pressure Support:  [10 cmH20] 10 cmH20 Plateau Pressure:  [14 cmH20-15 cmH20] 15 cmH20  . sodium chloride 10 mL/hr at 06/28/19 1600  . [START ON 06/29/2019] sodium chloride    . sodium chloride 10 mL/hr at 06/28/19 1100  . albumin human Stopped (06/28/19 1145)  . cefUROXime (ZINACEF)  IV    . dexmedetomidine (PRECEDEX) IV infusion Stopped (06/28/19 1159)  . famotidine (PEPCID) IV Stopped (06/28/19 1145)  . insulin 2 mL/hr at 06/28/19 1600  . lactated ringers    . lactated ringers Stopped (06/28/19 1244)  . lactated ringers 20 mL/hr at 06/28/19 1600  . nitroGLYCERIN Stopped (06/28/19 1100)  . phenylephrine (NEO-SYNEPHRINE) Adult infusion 10 mcg/min (06/28/19 1600)  . vancomycin      I/O last 3 completed shifts: In: 1187.5 [P.O.:940; I.V.:247.5] Out: -    CBC Latest Ref Rng & Units 06/28/2019 06/28/2019 06/28/2019  WBC 4.0 - 10.5 K/uL 15.3(H) - -  Hemoglobin 12.0 - 15.0 g/dL 11.6(L) 8.8(L) 8.8(L)  Hematocrit 36.0 - 46.0 % 35.3(L) 26.0(L) 26.0(L)  Platelets 150 - 400 K/uL 154 - -    BMP Latest Ref Rng & Units 06/28/2019 06/28/2019 06/28/2019  Glucose 70 - 99 mg/dL - 164(H)  178(H)  BUN 8 - 23 mg/dL - 8 9  Creatinine 0.44 - 1.00 mg/dL - 0.40(L) 0.40(L)  BUN/Creat Ratio 12 - 28 - - -  Sodium 135 - 145 mmol/L 140 140 138  Potassium 3.5 - 5.1 mmol/L 4.2 4.2 5.5(H)  Chloride 98 - 111 mmol/L - 104 103  CO2 22 - 32 mmol/L - - -  Calcium 8.9 - 10.3 mg/dL - - -    ABG    Component Value Date/Time   PHART 7.426 06/28/2019 1001   PCO2ART 38.1 06/28/2019 1001   PO2ART 523.0 (H) 06/28/2019 1001   HCO3 25.0 06/28/2019 1001   TCO2 26 06/28/2019 1001   O2SAT 100.0 06/28/2019 1001       Melodie Bouillon, MD 06/28/2019 5:34 PM

## 2019-06-28 NOTE — Procedures (Signed)
Extubation Procedure Note  Patient Details:   Name: Rachel Valentine DOB: October 09, 1940 MRN: [id number]   Airway Documentation:    Vent end date: 06/28/19 Vent end time: 1630   Evaluation  O2 sats: stable throughout Complications: No apparent complications Patient did tolerate procedure well. Bilateral Breath Sounds: Clear   Yes   Leak test positive.  No stridor noted.   Moraima Burd 06/28/2019, 4:38 PM

## 2019-06-28 NOTE — Progress Notes (Signed)
RN called d/t pt needing frequent stimulation to breath and request pt be placed back on full vent support.  PT placed back on rate of 10 per SICU protocol.  Unit RT aware.

## 2019-06-28 NOTE — Brief Op Note (Signed)
06/24/2019 - 06/28/2019  9:36 AM  PATIENT:  Rachel Valentine  78 y.o. female  PRE-OPERATIVE DIAGNOSIS:  Coronary Artery disease  POST-OPERATIVE DIAGNOSIS:  Coronary Artery disease  PROCEDURE:   -SINGLE VESSEL CORONARY ARTERY BYPASS GRAFTING    SVG->PDA  -ENDOVASCULAR SAPHENOUS VEIN HARVEST  -TRANSESOPHAGEAL ECHOCARDIOGRAM (TEE)   SURGEON:  Gaye Pollack, MD  PHYSICIAN ASSISTANT: [place]ddenberry   ANESTHESIA:   general  EBL:  Per anesthesia and perfusion records   BLOOD ADMINISTERED:none  DRAINS: Mediastinal Blake Drains   LOCAL MEDICATIONS USED:  NONE  SPECIMEN:  No Specimen  DISPOSITION OF SPECIMEN:  N/A  COUNTS:  YES  DICTATION: .Dragon Dictation  PLAN OF CARE: Admit to inpatient   PATIENT DISPOSITION:  ICU - extubated and stable.   Delay start of Pharmacological VTE agent (>24hrs) due to surgical blood loss or risk of bleeding: yes

## 2019-06-29 ENCOUNTER — Inpatient Hospital Stay (HOSPITAL_COMMUNITY): Payer: Medicare HMO

## 2019-06-29 ENCOUNTER — Encounter (HOSPITAL_COMMUNITY): Payer: Self-pay | Admitting: Surgery

## 2019-06-29 DIAGNOSIS — I25119 Atherosclerotic heart disease of native coronary artery with unspecified angina pectoris: Secondary | ICD-10-CM | POA: Diagnosis not present

## 2019-06-29 DIAGNOSIS — Z951 Presence of aortocoronary bypass graft: Secondary | ICD-10-CM

## 2019-06-29 DIAGNOSIS — E785 Hyperlipidemia, unspecified: Secondary | ICD-10-CM | POA: Diagnosis not present

## 2019-06-29 DIAGNOSIS — I2510 Atherosclerotic heart disease of native coronary artery without angina pectoris: Secondary | ICD-10-CM | POA: Diagnosis not present

## 2019-06-29 LAB — POCT I-STAT 7, (LYTES, BLD GAS, ICA,H+H)
Acid-base deficit: 4 mmol/L — ABNORMAL HIGH (ref 0.0–2.0)
Acid-base deficit: 5 mmol/L — ABNORMAL HIGH (ref 0.0–2.0)
Acid-base deficit: 2 mmol/L (ref 0.0–2.0)
Bicarbonate: 22.1 mmol/L (ref 20.0–28.0)
Bicarbonate: 22.1 mmol/L (ref 20.0–28.0)
Bicarbonate: 24.7 mmol/L (ref 20.0–28.0)
Calcium, Ion: 1.13 mmol/L — ABNORMAL LOW (ref 1.15–1.40)
Calcium, Ion: 1.19 mmol/L (ref 1.15–1.40)
Calcium, Ion: 1.17 mmol/L (ref 1.15–1.40)
HCT: 33 % — ABNORMAL LOW (ref 36.0–46.0)
HCT: 35 % — ABNORMAL LOW (ref 36.0–46.0)
HCT: 35 % — ABNORMAL LOW (ref 36.0–46.0)
Hemoglobin: 11.2 g/dL — ABNORMAL LOW (ref 12.0–15.0)
Hemoglobin: 11.9 g/dL — ABNORMAL LOW (ref 12.0–15.0)
Hemoglobin: 11.9 g/dL — ABNORMAL LOW (ref 12.0–15.0)
O2 Saturation: 99 %
O2 Saturation: 99 %
O2 Saturation: 100 %
Patient temperature: 35.8
Patient temperature: 36
Patient temperature: 35.9
Potassium: 3.4 mmol/L — ABNORMAL LOW (ref 3.5–5.1)
Potassium: 4.2 mmol/L (ref 3.5–5.1)
Potassium: 4.1 mmol/L (ref 3.5–5.1)
Sodium: 141 mmol/L (ref 135–145)
Sodium: 141 mmol/L (ref 135–145)
Sodium: 142 mmol/L (ref 135–145)
TCO2: 23 mmol/L (ref 22–32)
TCO2: 24 mmol/L (ref 22–32)
TCO2: 26 mmol/L (ref 22–32)
pCO2 arterial: 39.2 mmHg (ref 32.0–48.0)
pCO2 arterial: 45.6 mmHg (ref 32.0–48.0)
pCO2 arterial: 45.4 mmHg (ref 32.0–48.0)
pH, Arterial: 7.353 (ref 7.350–7.450)
pH, Arterial: 7.288 — ABNORMAL LOW (ref 7.350–7.450)
pH, Arterial: 7.337 — ABNORMAL LOW (ref 7.350–7.450)
pO2, Arterial: 140 mmHg — ABNORMAL HIGH (ref 83.0–108.0)
pO2, Arterial: 181 mmHg — ABNORMAL HIGH (ref 83.0–108.0)
pO2, Arterial: 206 mmHg — ABNORMAL HIGH (ref 83.0–108.0)

## 2019-06-29 LAB — CBC
HCT: 32.4 % — ABNORMAL LOW (ref 36.0–46.0)
HCT: 33.5 % — ABNORMAL LOW (ref 36.0–46.0)
Hemoglobin: 10.7 g/dL — ABNORMAL LOW (ref 12.0–15.0)
Hemoglobin: 10.6 g/dL — ABNORMAL LOW (ref 12.0–15.0)
MCH: 29.6 pg (ref 26.0–34.0)
MCH: 29.6 pg (ref 26.0–34.0)
MCHC: 33 g/dL (ref 30.0–36.0)
MCHC: 31.6 g/dL (ref 30.0–36.0)
MCV: 89.8 fL (ref 80.0–100.0)
MCV: 93.6 fL (ref 80.0–100.0)
Platelets: 152 10*3/uL (ref 150–400)
Platelets: 196 10*3/uL (ref 150–400)
RBC: 3.61 MIL/uL — ABNORMAL LOW (ref 3.87–5.11)
RBC: 3.58 MIL/uL — ABNORMAL LOW (ref 3.87–5.11)
RDW: 15.7 % — ABNORMAL HIGH (ref 11.5–15.5)
RDW: 16.1 % — ABNORMAL HIGH (ref 11.5–15.5)
WBC: 12.3 10*3/uL — ABNORMAL HIGH (ref 4.0–10.5)
WBC: 14 10*3/uL — ABNORMAL HIGH (ref 4.0–10.5)
nRBC: 0 % (ref 0.0–0.2)
nRBC: 0 % (ref 0.0–0.2)

## 2019-06-29 LAB — BASIC METABOLIC PANEL
Anion gap: 4 — ABNORMAL LOW (ref 5–15)
Anion gap: 8 (ref 5–15)
BUN: 12 mg/dL (ref 8–23)
BUN: 13 mg/dL (ref 8–23)
CO2: 24 mmol/L (ref 22–32)
CO2: 23 mmol/L (ref 22–32)
Calcium: 8.1 mg/dL — ABNORMAL LOW (ref 8.9–10.3)
Calcium: 8.3 mg/dL — ABNORMAL LOW (ref 8.9–10.3)
Chloride: 110 mmol/L (ref 98–111)
Chloride: 105 mmol/L (ref 98–111)
Creatinine, Ser: 0.63 mg/dL (ref 0.44–1.00)
Creatinine, Ser: 0.69 mg/dL (ref 0.44–1.00)
GFR calc Af Amer: >60 mL/min (ref >60)
GFR calc Af Amer: >60 mL/min (ref >60)
GFR calc non Af Amer: >60 mL/min (ref >60)
GFR calc non Af Amer: >60 mL/min (ref >60)
Glucose, Bld: 145 mg/dL — ABNORMAL HIGH (ref 70–99)
Glucose, Bld: 181 mg/dL — ABNORMAL HIGH (ref 70–99)
Potassium: 4.6 mmol/L (ref 3.5–5.1)
Potassium: 4.1 mmol/L (ref 3.5–5.1)
Sodium: 138 mmol/L (ref 135–145)
Sodium: 136 mmol/L (ref 135–145)

## 2019-06-29 LAB — GLUCOSE, CAPILLARY
Glucose-Capillary: 136 mg/dL — ABNORMAL HIGH (ref 70–99)
Glucose-Capillary: 166 mg/dL — ABNORMAL HIGH (ref 70–99)
Glucose-Capillary: 172 mg/dL — ABNORMAL HIGH (ref 70–99)

## 2019-06-29 LAB — MAGNESIUM
Magnesium: 2.6 mg/dL — ABNORMAL HIGH (ref 1.7–2.4)
Magnesium: 2.5 mg/dL — ABNORMAL HIGH (ref 1.7–2.4)

## 2019-06-29 IMAGING — DX DG CHEST 1V PORT
1 series · 1 of 1 positions shown · non-contrast
Comparison: [DATE].

CLINICAL DATA: Status post coronary bypass graft.

EXAM:
PORTABLE CHEST 1 VIEW

[chest ap]
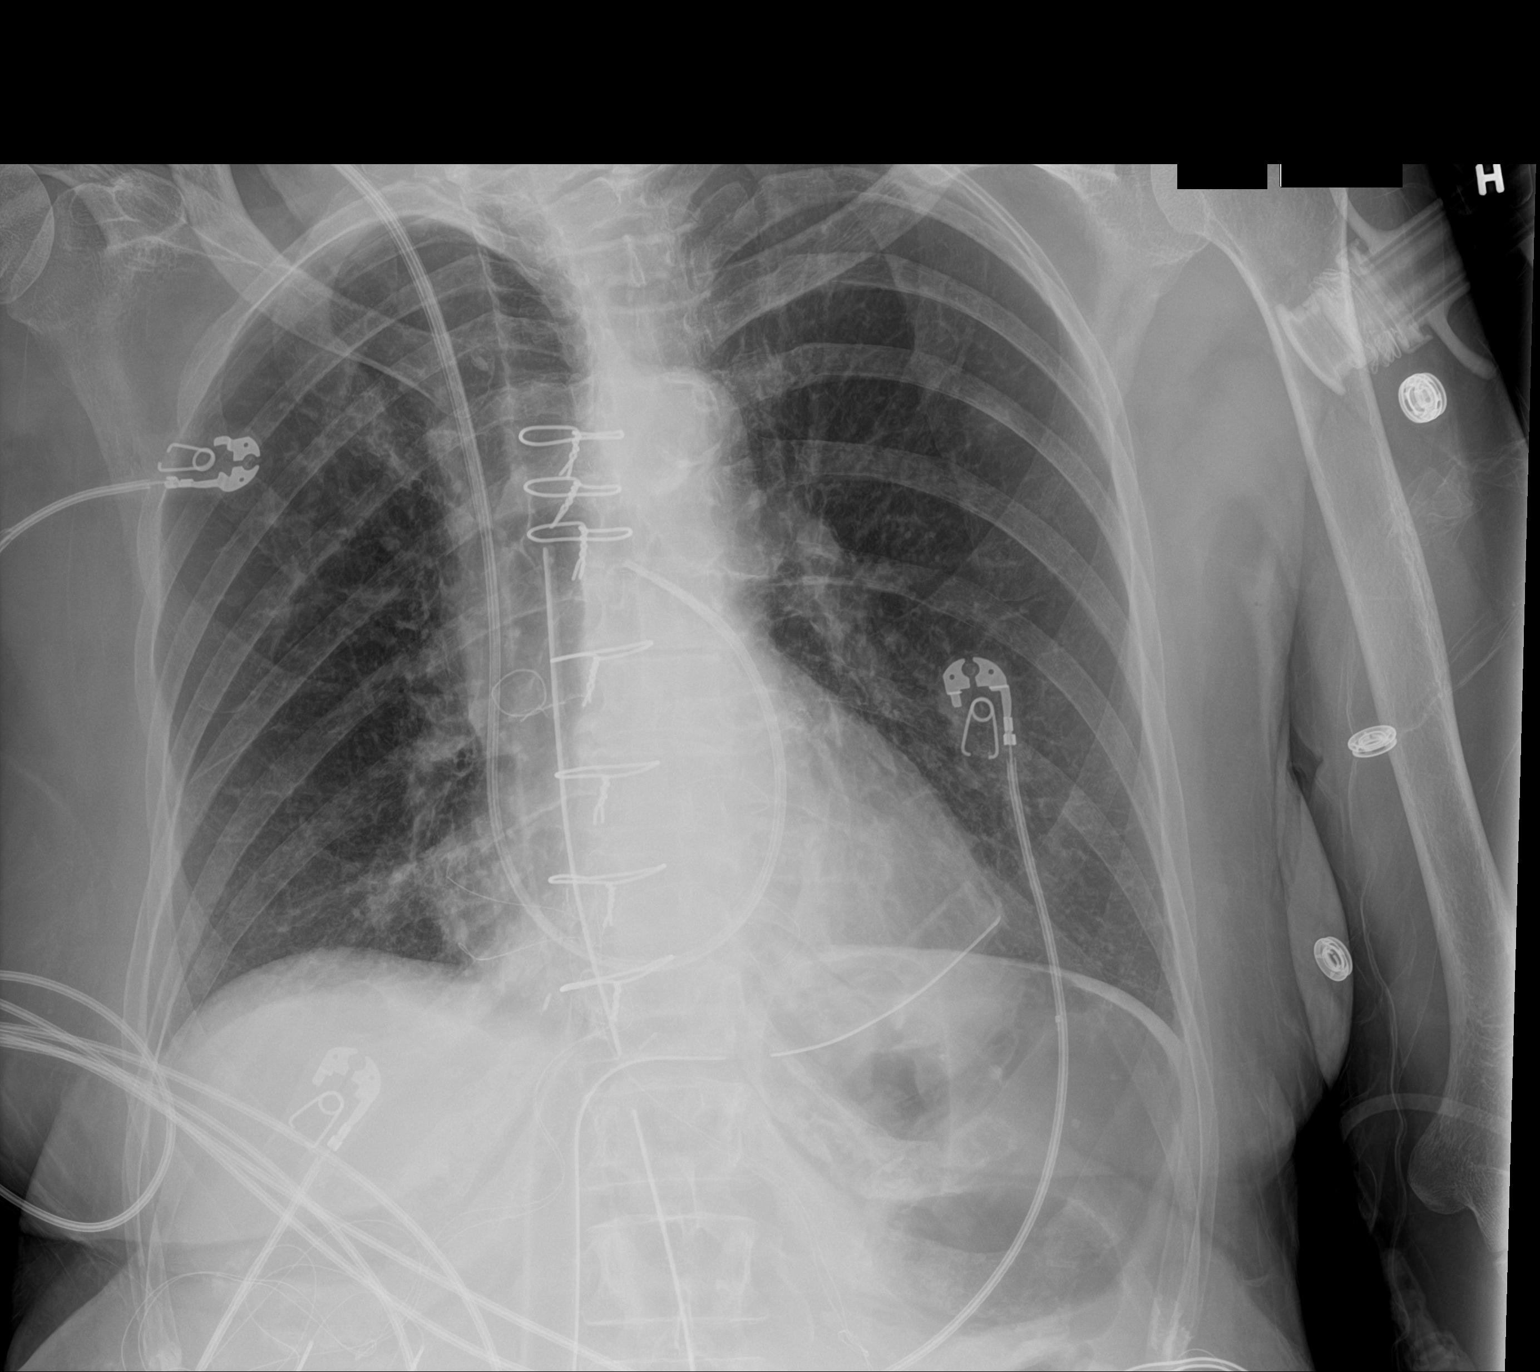

[1 of 1 positions shown; findings below may reference images not displayed]

FINDINGS: The heart size and mediastinal contours are within normal limits.
Both lungs are clear. Endotracheal and nasogastric tubes have been
removed. Right internal jugular Swan-Ganz catheter remains.
Mediastinal drain is again noted. No pneumothorax or pleural
effusion is noted. Lungs are clear. The visualized skeletal
structures are unremarkable.
IMPRESSION: Endotracheal and nasogastric tubes have been removed. Otherwise
stable support apparatus. No acute abnormality is seen.

## 2019-06-29 MED ORDER — TRAMADOL HCL 50 MG PO TABS: 50.0000 mg | ORAL_TABLET | ORAL | Status: DC | PRN

## 2019-06-29 MED ORDER — ENOXAPARIN SODIUM 40 MG/0.4ML ~~LOC~~ SOLN
40.0000 mg | Freq: Every day | SUBCUTANEOUS | Status: DC
  Administered 2019-06-29: 40 mg via SUBCUTANEOUS
  Filled 2019-06-29: qty 0.4

## 2019-06-29 MED ORDER — METOCLOPRAMIDE HCL 5 MG/ML IJ SOLN
10.0000 mg | Freq: Four times a day (QID) | INTRAMUSCULAR | Status: DC
  Administered 2019-06-29 (×2): 10 mg via INTRAVENOUS
  Filled 2019-06-29 (×3): qty 2

## 2019-06-29 MED ORDER — MORPHINE SULFATE (PF) 2 MG/ML IV SOLN: 1.0000 mg | INTRAVENOUS | Status: DC | PRN

## 2019-06-29 MED FILL — Heparin Sodium (Porcine) Inj 1000 Unit/ML: INTRAMUSCULAR | Qty: 30 | Status: AC

## 2019-06-29 MED FILL — Magnesium Sulfate Inj 50%: INTRAMUSCULAR | Qty: 10 | Status: AC

## 2019-06-29 MED FILL — Thrombin (Recombinant) For Soln 20000 Unit: CUTANEOUS | Qty: 1 | Status: AC

## 2019-06-29 MED FILL — Potassium Chloride Inj 2 mEq/ML: INTRAVENOUS | Qty: 40 | Status: AC

## 2019-06-29 NOTE — Discharge Summary (Signed)
Physician Discharge Summary  Patient ID: Rachel Valentine MRN: [id number] DOB/AGE: [birth year] 78 y.o.  Admit date: 06/24/2019 Discharge date: 07/02/2019  Admission Diagnoses: Coronary artery disease S/P percutaneous coronary angioplasty Unstable angina pectoris Dyslipidemia    Discharge Diagnoses:  Coronary artery disease S/P percutaneous coronary angioplasty Unstable angina pectoris Dyslipidemia S/P CABG x 1 Expected acute blood loss anemia  Discharged Condition: good  History of Present Illness  The patient is a 78 year old woman with a history of hypertension and coronary artery disease status post cardiac catheterization in May 2020 showing a 90% left circumflex stenosis as well as high-grade diffuse calcific stenosis within the RCA.  The LAD was normal.  She had a drug-eluting stent placed in the mid left circumflex coronary artery and subsequently underwent atherectomy and drug-eluting stent x2 to the mid and distal right coronary.  She was continued on dual antiplatelet therapy and Lipitor.  She did well until about 3 weeks ago when she began having the same substernal chest discomfort with exertion that she had prior to stent placement.  This was associated with nausea and fatigue.  She said that she has had few episodes at rest.  She was walking 3 miles per day until this started and now cannot walk more than 10 minutes without having chest pain.  She underwent cardiac catheterization today by Dr. Gwenlyn Found which showed diffuse calcific stenosis within the RCA with 99% restenosis within the proximal RCA.  There was also about 40% ostial left main narrowing.  There is about 40% stenosis in the left circumflex.  The previously placed stent in the left circumflex is widely patent.  The LAD has no stenosis.  Hospital Course:   Coronary bypass grafting was offered to the patient by Dr. Cyndia Bent and she elected to proceed with surgery.  She remained stable and was taken to the OR on  06/28/19 where single-vessel CABG with SVG to the PDA was performed without complication. Following surgery, she was transferred to the ICU in stable condition. She was extubated on the evening of surgery. Her vital signs and hemodynamics remained stable. She was mobilized on the first post-op day. Her cardiac rhythm remained stable. She was transferred to 4E progressive  Care on the second post-op day. She quickly regained independence with her mobility. Diet was advanced and well tolerated.   Consults: None  Significant Diagnostic Studies:   LEFT HEART CATH AND CORONARY ANGIOGRAPHY  Conclusion    Prox RCA lesion is 99% stenosed.  Prox RCA to Mid RCA lesion is 50% stenosed.  Ost RCA to Prox RCA lesion is 70% stenosed.  Previously placed Prox Cx to Mid Cx stent (unknown type) is widely patent.  Ost Cx to Prox Cx lesion is 40% stenosed.  Mid Cx to Dist Cx lesion is 40% stenosed.  Ost LM to Mid LM lesion is 40% stenosed.   Rachel Valentine is 78 y.o. female    [id number] LOCATION:  FACILITY: [facility]  PHYSICIAN: Quay Burow, M.D. [birth year]   DATE OF PROCEDURE:  06/24/2019  DATE OF DISCHARGE:     CARDIAC CATHETERIZATION / Attempted PCI RCA    History obtained from chart review.Rachel Valentine is a 78 year old thin appearing Caucasian female status post mid AV groove circumflex tenting by Dr. Ellyn Hack in May followed by orbital atherectomy, PCI and stenting of a diffusely calcified and diseased RCA by Dr. Angelena Form the following day.  Her LAD is free of significant disease.  She has normal LV function.  She did well  after that until recently when she is had accelerated angina which is nitro responsive.  She was seen virtually by Jory Sims, NP who arrange for her to undergo outpatient diagnostic coronary angiography today.   PROCEDURE DESCRIPTION:   The patient was brought to the second floor [facility] cath lab in the postabsorptive state.   She was premedicated with IV Versed and fentanyl.  Her right wristwas prepped and shaved in usual sterile fashion. Xylocaine 1% was used for local anesthesia. A 6 French sheath was inserted into the right radial artery using standard Seldinger technique. The patient received 3000 units  of heparin intravenously.  A 5 [place] TIG catheter pigtail catheters were used for selective coronary angiography and obtain left heart pressures.  Omnipaque dye was used for the entirety of the case.  Retrograde aorta, ventricular and pullback pressures were recorded.  Radial cocktail was administered via the SideArm sheath.  The patient received an additional 3000 units of heparin with an ACT in the 300 range.  On the pigtail she is for the entirety of the intervention.  Retroaortic pressures monitored to the case.  Patient was already on uninterrupted DAPT with aspirin and clopidogrel.  Using a 6 [place] AL 0.75 cm guide catheter along with a 0.14 prowater guidewire I attempted to cross the 99% mid dominant RCA stenosis within the previously placed stent.  The [place] would not go.  I used a 2 mm balloon as backup and again would not cross the vessel.  I changed to a [employer] and it appeared that the lesion directed the wire outside of the stent into the subintimal space.  I was unable to pass a balloon down to the lesion.  After reviewing the case with Dr. Claiborne Billings the consensus was to terminate the procedure and get a T CTS consult.   IMPRESSION: Unsuccessful attempt at RCA PCI and reintervention for aggressive in-stent restenosis in the setting of accelerated angina.  I was unable to cross the disease segment and I suspect the wire was outside of the stented segment in the subintimal space.  Because of this I elected to abort the procedure and have T CTS evaluate for CABG.  The sheath was removed and a TR band was placed on the right wrist to achieve patent hemostasis.  The patient left lab in stable condition.   Clopidogrel was discontinued.  I will rerestart heparin 4 hours after sheath removal.  Quay Burow. MD, [facility] 06/24/2019 2:14 PM      Surgeon Notes    06/28/2019 11:30 AM Op Note signed by Gaye Pollack, MD  Indications  Unstable angina ([place]) [I20.0 (ICD-10-CM)]  Procedural Details  Technical Details Estimated blood loss <50 mL.   During this procedure medications were administered to achieve and maintain moderate conscious sedation while the patient's heart rate, blood pressure, and oxygen saturation were continuously monitored and I was present face-to-face 100% of this time.  Medications (Filter: Administrations occurring from 06/24/19 1245 to 06/24/19 1423) (important)  Continuous medications are totaled by the amount administered until 06/24/19 1423.  Medication Rate/Dose/Volume Action  Date Time   fentaNYL (SUBLIMAZE) injection (mcg) 25 mcg Given 06/24/19 1253   Total dose as of 06/24/19 1423        25 mcg        midazolam (VERSED) injection (mg) 1 mg Given 06/24/19 1253   Total dose as of 06/24/19 1423        1 mg  lidocaine (PF) (XYLOCAINE) 1 % injection (mL) 2 mL Given 06/24/19 1259   Total dose as of 06/24/19 1423        2 mL        Radial Cocktail (Verapamil 5 mg, NTG, Lidocaine) (mL) 10 mL Given 06/24/19 1300   Total dose as of 06/24/19 1423 10 mL Given 1317   20 mL        Heparin (Porcine) in NaCl 1000-0.9 UT/500ML-% SOLN (mL) 500 mL Given 06/24/19 1301   Total dose as of 06/24/19 1423 500 mL Given 1301   1,000 mL        heparin injection (Units) 3,000 Units Given 06/24/19 1303   Total dose as of 06/24/19 1423 3,000 Units Given 1313   6,000 Units        iohexol (OMNIPAQUE) 350 MG/ML injection (mL) 80 mL Given 06/24/19 1356   Total dose as of 06/24/19 1423        80 mL        Sedation Time  Sedation Time Physician-1: 1 hour 2 minutes 28 seconds  Contrast  Medication Name Total Dose  iohexol (OMNIPAQUE) 350 MG/ML injection 80 mL      Radiation/Fluoro  Fluoro time: 22.2 (min) DAP: [id number] (mGycm2) Cumulative Air Kerma: 455 (mGy)  Coronary Findings  Diagnostic Dominance: Right Left Main  Ost LM to Mid LM lesion 40% stenosed  Ost LM to Mid LM lesion is 40% stenosed.  Left Circumflex  Ost Cx to Prox Cx lesion 40% stenosed  Ost Cx to Prox Cx lesion is 40% stenosed.  Prox Cx to Mid Cx lesion 0% stenosed  Previously placed Prox Cx to Mid Cx stent (unknown type) is widely patent. The lesion is located at the bend.  Mid Cx to Dist Cx lesion 40% stenosed  Mid Cx to Dist Cx lesion is 40% stenosed.  Right Coronary Artery  Ost RCA to Prox RCA lesion 70% stenosed  Ost RCA to Prox RCA lesion is 70% stenosed.  Prox RCA lesion 99% stenosed  Prox RCA lesion is 99% stenosed. The lesion was previously treated.  Prox RCA to Mid RCA lesion 50% stenosed  Prox RCA to Mid RCA lesion is 50% stenosed. The lesion was previously treated.  Intervention  No interventions have been documented. Coronary Diagrams  Diagnostic Dominance: Right     ECHOCARDIOGRAM REPORT       Patient Name:   CARLYSSA CASTIGLIONI Date of Exam: 01/02/2019 Medical Rec #:  [id number]           Height:       62.0 in Accession #:    [id number]          Weight:       119.9 lb Date of Birth:  12-Feb-1941           BSA:          1.54 m Patient Age:    67 years            BP:           142/70 mmHg Patient Gender: F                   HR:           91 bpm. Exam Location:  Inpatient    Procedure: 2D Echo, Cardiac Doppler and Color Doppler  Indications:    R07.9* Chest pain, unspecified   History:        Patient has no prior history  of Echocardiogram examinations.   Sonographer:    Jonelle Sidle Dance Referring Phys: [id number] [place]    1. The left ventricle has hyperdynamic systolic function, with an ejection fraction of >65%. The cavity size was normal. Left ventricular diastolic Doppler parameters are indeterminate.  2. The right  ventricle has normal systolic function. The cavity was normal. There is no increase in right ventricular wall thickness.  3. The mitral valve is grossly normal. There is mild mitral annular calcification present.  4. The aortic valve is tricuspid. Mild thickening of the aortic valve. Mild calcification of the aortic valve. No stenosis of the aortic valve.  5. The aortic root is normal in size and structure.  6. The interatrial septum was not assessed.  FINDINGS  Left Ventricle: The left ventricle has hyperdynamic systolic function, with an ejection fraction of >65%. The cavity size was normal. There is no increase in left ventricular wall thickness. Left ventricular diastolic Doppler parameters are  indeterminate.  Right Ventricle: The right ventricle has normal systolic function. The cavity was normal. There is no increase in right ventricular wall thickness.  Left Atrium: Left atrial size was normal in size.  Right Atrium: Right atrial size was normal in size. Right atrial pressure is estimated at 3 mmHg.  Interatrial Septum: The interatrial septum was not assessed.  Pericardium: There is no evidence of pericardial effusion.  Mitral Valve: The mitral valve is grossly normal. There is mild mitral annular calcification present. Mitral valve regurgitation is not visualized by color flow Doppler.  Tricuspid Valve: The tricuspid valve is normal in structure. Tricuspid valve regurgitation is trivial by color flow Doppler.  Aortic Valve: The aortic valve is tricuspid Mild thickening of the aortic valve. Mild calcification of the aortic valve. Aortic valve regurgitation was not visualized by color flow Doppler. There is No stenosis of the aortic valve.  Pulmonic Valve: The pulmonic valve was grossly normal. Pulmonic valve regurgitation is not visualized by color flow Doppler.  Aorta: The aortic root is normal in size and structure.  Venous: The inferior vena cava is normal in size  with greater than 50% respiratory variability.    +--------------+--------++  LEFT VENTRICLE            +----------------+---------++ +--------------+--------++  Diastology                    PLAX 2D                   +----------------+---------++ +--------------+--------++  LV e' lateral:   5.77 cm/s    LVIDd:         3.66 cm    +----------------+---------++ +--------------+--------++  LV E/e' lateral: 12.3         LVIDs:         2.29 cm    +----------------+---------++ +--------------+--------++  LV e' medial:    4.68 cm/s    LV PW:         0.66 cm    +----------------+---------++ +--------------+--------++  LV E/e' medial:  15.1         LV IVS:        0.92 cm    +----------------+---------++ +--------------+--------++  LVOT diam:     2.10 cm    +--------------+--------++  LV SV:         39 ml      +--------------+--------++  LV SV Index:   25.03      +--------------+--------++  LVOT Area:     3.46 cm   +--------------+--------++                            +--------------+--------++  +---------------+----------++  RIGHT VENTRICLE              +---------------+----------++  RV Basal diam:  2.03 cm      +---------------+----------++  RV S prime:     19.00 cm/s   +---------------+----------++  TAPSE (M-mode): 2.2 cm       +---------------+----------++  +-------------+-------++-----------++  LEFT ATRIUM            Index         +-------------+-------++-----------++  LA diam:      3.30 cm  2.15 cm/m    +-------------+-------++-----------++  LA Vol (A2C): 38.4 ml  24.97 ml/m   +-------------+-------++-----------++  LA Vol (A4C): 33.9 ml  22.04 ml/m   +-------------+-------++-----------++ +------------+--------++----------++  RIGHT ATRIUM           Index        +------------+--------++----------++  RA Area:     8.09 cm               +------------+--------++----------++  RA Volume:   15.00 ml  9.75 ml/m   +------------+--------++----------++   +------------+-----------++  AORTIC VALVE               +------------+-----------++  LVOT Vmax:   109.00 cm/s   +------------+-----------++  LVOT Vmean:  70.900 cm/s   +------------+-----------++  LVOT VTI:    0.228 m       +------------+-----------++  AR PHT:      367 msec      +------------+-----------++   +-------------+-------++  AORTA                   +-------------+-------++  Ao Root diam: 3.10 cm   +-------------+-------++  +--------------+----------++  +---------------+-----------++  MITRAL VALVE                  TRICUSPID VALVE               +--------------+----------++  +---------------+-----------++  MV Area (PHT): 3.53 cm       TR Peak grad:   23.3 mmHg     +--------------+----------++  +---------------+-----------++  MV PHT:        62.35 msec     TR Vmax:        249.00 cm/s   +--------------+----------++  +---------------+-----------++  MV Decel Time: 215 msec     +--------------+----------++  +--------------+-------+ +--------------+-----------++  SHUNTS                   MV E velocity: 70.80 cm/s    +--------------+-------+ +--------------+-----------++  Systemic VTI:  0.23 m    MV A velocity: 109.00 cm/s   +--------------+-------+ +--------------+-----------++  Systemic Diam: 2.10 cm   MV E/A ratio:  0.65          +--------------+-------+ +--------------+-----------++    Dorris Carnes MD Electronically signed by Dorris Carnes MD Signature Date/Time: 01/02/2019/2:23:18 PM      Treatments:                                                                                               CARDIOVASCULAR SURGERY OPERATIVE NOTE  06/28/2019  Surgeon:  Gaye Pollack, MD  First Assistant: [name], PA-C   Preoperative Diagnosis:  High grade RCA restenosis.   Postoperative Diagnosis:  Same   Procedure:  1. Median Sternotomy 2. Extracorporeal circulation 3.   Coronary artery bypass grafting x 1   SVG to PDA  4.   Endoscopic vein  harvest from the right leg   Anesthesia:  General Endotracheal   Clinical History/Surgical Indication:  This 78 year old woman has high-grade restenosis within a diffusely diseased right coronary artery. She had an unsuccessful attempt at PCI today. She is very symptomatic with minimal activity and has had some pain at rest while at home. I agree that coronary artery bypass graft surgery is the best treatment. She will only require a graft to the posterior descending artery. She does have a diffusely diseased right coronary artery extending out into the posterior descending branch and I think we will have to have a graft placed to the more distal posterior descending branch. This will require a saphenous vein graft because I do not think a right internal mammary graft will reach that far. She has been on Plavix which was stopped today. I will plan to do surgery on Monday. I discussed the operative procedure with the patient including alternatives, benefits and risks; including but not limited to bleeding, blood transfusion, infection, stroke, myocardial infarction, graft failure, heart block requiring a permanent pacemaker, organ dysfunction, and death.Kimore Blash Valentine and agrees to proceed.   Discharge Exam: Blood pressure 110/61, pulse 85, temperature 98.5 F (36.9 C), temperature source Oral, resp. rate 17, height 5\' 2"  (1.575 m), weight 54 kg, SpO2 95 %.   Physical Exam General appearance:alert, cooperative and no distress Neurologic:intact Heart:Stable SR. Lungs:Breath sounds clear Abdomen:Soft, NT. Extremities:no edema. RLE EVH incision is intact and dry. Wound:The sternotomy incision is intact and dry. The CT sites are covered with a dry dressing.   Disposition:   Discharge Instructions    AMB Referral to Cardiac Rehabilitation - Phase II   Complete by: As directed    Diagnosis: Coronary Stents   After initial evaluation and assessments  completed: Virtual Based Care may be provided alone or in conjunction with Phase 2 Cardiac Rehab based on patient barriers.: Yes     Allergies as of 07/02/2019      Reactions   Fosamax [alendronate] Nausea Only   Unset stomach   Prednisone Other (See Comments)   Blood pressure high when she takes it   Penicillins Rash   Did it involve swelling of the face/tongue/throat, SOB, or low BP? No Did it involve sudden or severe rash/hives, skin peeling, or any reaction on the inside of your mouth or nose? No Did you need to seek medical attention at a hospital or doctor's office? Yes When did it last happen?Childhood If all above answers are NO, may proceed with cephalosporin use.      Medication List    STOP taking these medications   clopidogrel 75 MG tablet Commonly known as: PLAVIX     TAKE these medications   amLODipine 2.5 MG tablet Commonly known as: NORVASC Take 1 tablet (2.5 mg total) by mouth at bedtime.   aspirin EC 81 MG tablet Take 1 tablet (81 mg total) by mouth daily.   Biotin 10000 MCG Tabs Take 10,000 mg by mouth daily.   CITRACAL +D3 PO Take 1 tablet by mouth daily.   diphenhydrAMINE 25 mg capsule Commonly known as: BENADRYL Take 25 mg by mouth at bedtime as needed for allergies.   metoprolol tartrate  25 MG tablet Commonly known as: LOPRESSOR Take 0.5 tablets (12.5 mg total) by mouth 2 (two) times daily.   nitroGLYCERIN 0.4 MG SL tablet Commonly known as: NITROSTAT Place 1 tablet (0.4 mg total) under the tongue every 5 (five) minutes as needed for chest pain.   oxyCODONE-acetaminophen 5-325 MG tablet Commonly known as: Percocet Take 1 tablet by mouth every 4 (four) hours as needed for up to 5 days for severe pain.   pantoprazole 40 MG tablet Commonly known as: PROTONIX Take 1 tablet (40 mg total) by mouth daily. What changed: when to take this   rosuvastatin 40 MG tablet Commonly known as: CRESTOR Take 40 mg by mouth daily.   Systane  Balance 0.6 % Soln Generic drug: Propylene Glycol Place 2 drops into both eyes as needed (Dry eye).      Follow-up Information    Gaye Pollack, MD. Go on 07/28/2019.   Specialty: Cardiothoracic Surgery Why: You have an appointment with Dr. Cyndia Bent on Wednesday, 07/28/19 at 12 noon.  Please arrive 30 minutes early for a chest x-ray at [facility] located on the first floor of the same building.  Contact information: [address] [place] [address] [phone]        Deberah Pelton, NP. Go on 07/13/2019.   Specialty: Cardiology Why: You have a cardiology follow up appointment with Johnanna Schneiders, NP on Tuesday, 07/13/19 at 9:30am.  Contact information: [address] [place] [address] [phone]        [facility] [place]. Go on 07/09/2019.   Specialty: Cardiothoracic Surgery Why: You have an appointment for suture removal on Friday 11/6 at 11:30. Contact information: [place], [place] [phone]         The patient has been discharged on:   1.Beta Blocker:  Yes [ x  ]                              No   [   ]                              If No, reason:  2.Ace Inhibitor/ARB: Yes [   ]                                     No  [  x  ]                                     If No, reason:  BP soft, will add as outpatient.   3.Statin:   Yes [ x  ]                  No  [   ]                  If No, reason:  4.Ecasa:  Yes  [ x  ]                  No   [   ]                  If No, reason:  Signed: Antony Odea, PA-C 07/02/2019, 8:04 AM

## 2019-06-29 NOTE — Progress Notes (Signed)
No EKG result in Epic.  Called EKG to request AM EKG be completed per request of MD Bartle.  EKG tech advised that an EKG was completed and paper copy placed in patient's chart.  EKG tech advised EKG machine not transmitting EKG's to Epic and this issue has been reported.  Confirmed EKG in patient's chart.  EKG completed 10/27 at 0654 showing sinus rhythm with premature atrial complexes.

## 2019-06-29 NOTE — Progress Notes (Signed)
Patient ID: Rachel Valentine, female   DOB: September 07, 1940, 78 y.o.   MRN: [id number] TCTS Evening Rounds:  Hemodynamically stable in sinus rhythm.  Has ambulated.   BMET    Component Value Date/Time   NA 136 06/29/2019 1647   NA 143 06/21/2019 1436   K 4.1 06/29/2019 1647   CL 105 06/29/2019 1647   CO2 23 06/29/2019 1647   GLUCOSE 181 (H) 06/29/2019 1647   BUN 13 06/29/2019 1647   BUN 15 06/21/2019 1436   CREATININE 0.69 06/29/2019 1647   CALCIUM 8.3 (L) 06/29/2019 1647   GFRNONAA >60 06/29/2019 1647   GFRAA >60 06/29/2019 1647   CBC    Component Value Date/Time   WBC 14.0 (H) 06/29/2019 1647   RBC 3.58 (L) 06/29/2019 1647   HGB 10.6 (L) 06/29/2019 1647   HGB 13.9 06/21/2019 1436   HCT 33.5 (L) 06/29/2019 1647   HCT 40.4 06/21/2019 1436   PLT 196 06/29/2019 1647   PLT 343 06/21/2019 1436   MCV 93.6 06/29/2019 1647   MCV 89 06/21/2019 1436   MCH 29.6 06/29/2019 1647   MCHC 31.6 06/29/2019 1647   RDW 16.1 (H) 06/29/2019 1647   RDW 12.9 06/21/2019 1436

## 2019-06-29 NOTE — Progress Notes (Signed)
Progress Note  Patient Name: Rachel Valentine Date of Encounter: 06/29/2019  Primary Cardiologist: Quay Burow, MD   Subjective   Doing well this morning, only complains of chest soreness and denies shortness of breath.  She did complain of nausea and an episode of NBNB emesis this a.m.  Inpatient Medications    Scheduled Meds: . acetaminophen  1,000 mg Oral Q6H   Or  . acetaminophen (TYLENOL) oral liquid 160 mg/5 mL  1,000 mg Per Tube Q6H  . aspirin EC  325 mg Oral Daily   Or  . aspirin  324 mg Per Tube Daily  . bisacodyl  10 mg Oral Daily   Or  . bisacodyl  10 mg Rectal Daily  . Chlorhexidine Gluconate Cloth  6 each Topical Daily  . docusate sodium  200 mg Oral Daily  . insulin aspart  0-24 Units Subcutaneous Q4H  . mouth rinse  15 mL Mouth Rinse BID  . metoprolol tartrate  12.5 mg Oral BID   Or  . metoprolol tartrate  12.5 mg Per Tube BID  . mupirocin ointment  1 application Nasal BID  . [START ON 06/30/2019] pantoprazole  40 mg Oral Daily  . rosuvastatin  40 mg Oral Daily  . sodium chloride flush  3 mL Intravenous Q12H   Continuous Infusions: . sodium chloride 10 mL/hr at 06/28/19 2010  . sodium chloride    . sodium chloride 10 mL/hr at 06/28/19 1100  . albumin human Stopped (06/29/19 0233)  . cefUROXime (ZINACEF)  IV Stopped (06/28/19 2206)  . dexmedetomidine (PRECEDEX) IV infusion Stopped (06/28/19 1159)  . lactated ringers Stopped (06/28/19 1244)  . lactated ringers 20 mL/hr at 06/29/19 0500  . nitroGLYCERIN Stopped (06/28/19 1100)  . phenylephrine (NEO-SYNEPHRINE) Adult infusion 5 mcg/min (06/29/19 0500)   PRN Meds: sodium chloride, albumin human, metoprolol tartrate, midazolam, morphine injection, ondansetron (ZOFRAN) IV, oxyCODONE, sodium chloride flush, traMADol   Vital Signs    Vitals:   06/29/19 0300 06/29/19 0345 06/29/19 0400 06/29/19 0500  BP: 120/63  (!) 113/59 116/63  Pulse: 89 89 90 89  Resp: (!) 0 (!) 8 12 15   Temp: (!) 97.3 F  (36.3 C) (!) 97.5 F (36.4 C) 97.7 F (36.5 C) 97.7 F (36.5 C)  TempSrc:   Bladder   SpO2: 100% 100% 100% 100%  Weight:    56.9 kg  Height:        Intake/Output Summary (Last 24 hours) at 06/29/2019 [id number] Last data filed at 06/29/2019 0500 Gross per 24 hour  Intake 4479.17 ml  Output 1780 ml  Net 2699.17 ml   Filed Weights   06/24/19 1104 06/29/19 0500  Weight: 53.1 kg 56.9 kg    Telemetry    No arrhythmia- Personally Reviewed  ECG    None today- Personally Reviewed  Physical Exam   GEN: No acute distress.   Cardiac: RRR, no murmurs, rubs, or gallops.  Respiratory: Clear to auscultation bilaterally. GI: Soft, nontender, non-distended  MS: No edema; No deformity. Neuro:  Nonfocal  Psych: Normal affect   Labs    Chemistry Recent Labs  Lab 06/28/19 0304  06/28/19 1000  06/28/19 1611 06/28/19 1727 06/29/19 0402  NA 141   < > 140   < > 141 140 138  K 3.6   < > 4.2   < > 4.2 4.0 4.6  CL 109   < > 104  --   --  109 110  CO2 24  --   --   --   --  23 24  GLUCOSE 109*   < > 164*  --   --  111* 145*  BUN 10   < > 8  --   --  11 12  CREATININE 0.71   < > 0.40*  --   --  0.60 0.63  CALCIUM 8.9  --   --   --   --  7.7* 8.1*  GFRNONAA >60  --   --   --   --  >60 >60  GFRAA >60  --   --   --   --  >60 >60  ANIONGAP 8  --   --   --   --  8 4*   < > = values in this interval not displayed.     Hematology Recent Labs  Lab 06/28/19 1109 06/28/19 1611 06/28/19 1727 06/29/19 0402  WBC 15.3*  --  18.2* 12.3*  RBC 3.93  --  4.10 3.61*  HGB 11.6* 11.9* 12.2 10.7*  HCT 35.3* 35.0* 36.4 32.4*  MCV 89.8  --  88.8 89.8  MCH 29.5  --  29.8 29.6  MCHC 32.9  --  33.5 33.0  RDW 14.8  --  15.3 15.7*  PLT 154  --  184 152    Cardiac EnzymesNo results for input(s): TROPONINI in the last 168 hours. No results for input(s): TROPIPOC in the last 168 hours.   BNPNo results for input(s): BNP, PROBNP in the last 168 hours.   DDimer No results for input(s): DDIMER in the  last 168 hours.   Radiology    Dg Chest Port 1 View  Result Date: 06/28/2019 CLINICAL DATA:  Status post CABG. EXAM: PORTABLE CHEST 1 VIEW COMPARISON:  06/27/2019 FINDINGS: 1141 hours. Endotracheal tube tip is 2.5 cm above the base of the carina. Right IJ pulmonary artery catheter tip is in the right main pulmonary artery. Mediastinal/pericardial drains noted near the midline. The NG tube passes into the stomach although the distal tip position is not included on the film. The lungs are clear without focal pneumonia, edema, pneumothorax or pleural effusion. The visualized bony structures of the thorax are intact. Insert wire IMPRESSION: 1. Status post CABG. No evidence for pneumothorax or pleural effusion. 2. Endotracheal tube tip is 2.5 cm above the base of the carina. Electronically Signed   By: Misty Stanley M.D.   On: 06/28/2019 11:55   Dg Chest Port 1 View  Result Date: 06/27/2019 CLINICAL DATA:  Preoperative for cardiac bypass surgery EXAM: PORTABLE CHEST 1 VIEW COMPARISON:  01/01/2019 chest radiograph. FINDINGS: Stable cardiomediastinal silhouette with normal heart size. No pneumothorax. No pleural effusion. Lungs appear clear, with no acute consolidative airspace disease and no pulmonary edema. IMPRESSION: No active disease. Electronically Signed   By: Ilona Sorrel M.D.   On: 06/27/2019 20:48    Cardiac Studies   06/24/2019-[facility]   Prox RCA lesion is 99% stenosed.  Prox RCA to Mid RCA lesion is 50% stenosed.  Ost RCA to Prox RCA lesion is 70% stenosed.  Previously placed Prox Cx to Mid Cx stent (unknown type) is widely patent.  Ost Cx to Prox Cx lesion is 40% stenosed.  Mid Cx to Dist Cx lesion is 40% stenosed.  Ost LM to Mid LM lesion is 40% stenosed.  Patient Profile     78 y.o. female with unstable angina, hypertension, coronary artery disease with most recent cardiac catheterization on 01/04/2019 revealing normal LV function, 90% circumflex treated with mid circumflex  drug-eluting stent x1 Ellyn Hack).  Staged atherectomy and drug-eluting stent x2 to her mid and distal RCA on 01/06/2019 by Dr. Angelena Form.  Presented with diffuse RCA in-stent restenosis and inability to cross with wire likely related to underexpanded stents within the mid RCA stented segment.  She is now status post CABG x1.  Assessment & Plan    #Coronary artery disease status post CABG POD 1 Doing very well postop and currently denies shortness of breath.  On physical exams, no signs of volume overload and telemetry monitor does not show evidence of arrhythmia -Continue aspirin, Crestor  #Hyperlipidemia -Continue Crestor 40 mg daily  #Essential hypertension BP stable -Continue metoprolol     For questions or updates, please contact [facility] Please consult [email or web address] for contact info under Cardiology/STEMI.      Signed, Jean Rosenthal, MD  06/29/2019, 6:28 AM

## 2019-06-29 NOTE — Discharge Instructions (Signed)

## 2019-06-29 NOTE — Progress Notes (Signed)
1 Day Post-Op Procedure(s) (LRB): CORONARY ARTERY BYPASS GRAFTING (CABG) using endoscopic harvest right saphenous vein to the posterior lateral (PLB). (N/A) TRANSESOPHAGEAL ECHOCARDIOGRAM (TEE) (N/A) Subjective: Has been nauseous, minimal pain.  Objective: Vital signs in last 24 hours: Temp:  [95.7 F (35.4 C)-97.7 F (36.5 C)] 97.2 F (36.2 C) (10/27 0700) Pulse Rate:  [69-92] 87 (10/27 0700) Cardiac Rhythm: Normal sinus rhythm (10/27 0400) Resp:  [0-18] 16 (10/27 0600) BP: (93-145)/(53-77) 102/66 (10/27 0600) SpO2:  [99 %-100 %] 100 % (10/27 0700) Arterial Line BP: (21-158)/(7-67) 140/51 (10/27 0700) FiO2 (%):  [40 %-50 %] 40 % (10/26 1529) Weight:  [56.9 kg] 56.9 kg (10/27 0500)  Hemodynamic parameters for last 24 hours: PAP: (13-142)/(0-48) 23/8 CO:  [2.7 L/min-3.6 L/min] 2.8 L/min CI:  [1.8 L/min/m2-2.4 L/min/m2] 1.9 L/min/m2  Intake/Output from previous day: 10/26 0701 - 10/27 0700 In: 4523.8 [P.O.:140; I.V.:2123.8; Blood:400; IV [id number] Out: 2005 [Urine:955; Blood:700; Chest Tube:350] Intake/Output this shift: No intake/output data recorded.  General appearance: alert and cooperative Neurologic: intact Heart: regular rate and rhythm, S1, S2 normal, no murmur, click, rub or gallop Lungs: clear to auscultation bilaterally Extremities: edema mild Wound: dressings dry  Lab Results: Recent Labs    06/28/19 1727 06/29/19 0402  WBC 18.2* 12.3*  HGB 12.2 10.7*  HCT 36.4 32.4*  PLT 184 152   BMET:  Recent Labs    06/28/19 1727 06/29/19 0402  NA 140 138  K 4.0 4.6  CL 109 110  CO2 23 24  GLUCOSE 111* 145*  BUN 11 12  CREATININE 0.60 0.63  CALCIUM 7.7* 8.1*    PT/INR:  Recent Labs    06/28/19 1109  LABPROT 16.9*  INR 1.4*   ABG    Component Value Date/Time   PHART 7.288 (L) 06/28/2019 1611   HCO3 22.1 06/28/2019 1611   TCO2 24 06/28/2019 1611   ACIDBASEDEF 5.0 (H) 06/28/2019 1611   O2SAT 99.0 06/28/2019 1611   CBG (last 3)  Recent  Labs    06/28/19 2343 06/29/19 0407 06/29/19 0803  GLUCAP 117* 136* 166*   CXR: clear  ECG: pending  Assessment/Plan: S/P Procedure(s) (LRB): CORONARY ARTERY BYPASS GRAFTING (CABG) using endoscopic harvest right saphenous vein to the posterior lateral (PLB). (N/A) TRANSESOPHAGEAL ECHOCARDIOGRAM (TEE) (N/A)  POD 1 Hemodynamically stable off neo. Will hold off on beta blocker and diuretic today since PA pressures low and BP low normal.  DC chest tubes, swan, arterial line.  Glucose under good control and preop Hgb A1c normal so will stop CBG's.  Reglan and Zofran for nausea.  IS, OOB. Ambulate as tolerated.   LOS: 5 days    Rachel Valentine 06/29/2019

## 2019-06-30 ENCOUNTER — Inpatient Hospital Stay (HOSPITAL_COMMUNITY): Payer: Medicare HMO

## 2019-06-30 LAB — CBC
HCT: 30.8 % — ABNORMAL LOW (ref 36.0–46.0)
Hemoglobin: 9.7 g/dL — ABNORMAL LOW (ref 12.0–15.0)
MCH: 29.6 pg (ref 26.0–34.0)
MCHC: 31.5 g/dL (ref 30.0–36.0)
MCV: 93.9 fL (ref 80.0–100.0)
Platelets: 178 10*3/uL (ref 150–400)
RBC: 3.28 MIL/uL — ABNORMAL LOW (ref 3.87–5.11)
RDW: 16.1 % — ABNORMAL HIGH (ref 11.5–15.5)
WBC: 12.4 10*3/uL — ABNORMAL HIGH (ref 4.0–10.5)
nRBC: 0 % (ref 0.0–0.2)

## 2019-06-30 LAB — BASIC METABOLIC PANEL
Anion gap: 7 (ref 5–15)
BUN: 16 mg/dL (ref 8–23)
CO2: 24 mmol/L (ref 22–32)
Calcium: 8.1 mg/dL — ABNORMAL LOW (ref 8.9–10.3)
Chloride: 106 mmol/L (ref 98–111)
Creatinine, Ser: 0.71 mg/dL (ref 0.44–1.00)
GFR calc Af Amer: >60 mL/min (ref >60)
GFR calc non Af Amer: >60 mL/min (ref >60)
Glucose, Bld: 114 mg/dL — ABNORMAL HIGH (ref 70–99)
Potassium: 4.2 mmol/L (ref 3.5–5.1)
Sodium: 137 mmol/L (ref 135–145)

## 2019-06-30 LAB — ECHO INTRAOPERATIVE TEE
Height: 62 in
Weight: 1872 oz

## 2019-06-30 IMAGING — DX DG CHEST 1V PORT
1 series · 1 of 1 positions shown · non-contrast
Comparison: Yesterday

CLINICAL DATA: Sore chest after CABG

EXAM:
PORTABLE CHEST 1 VIEW

[chest ap]
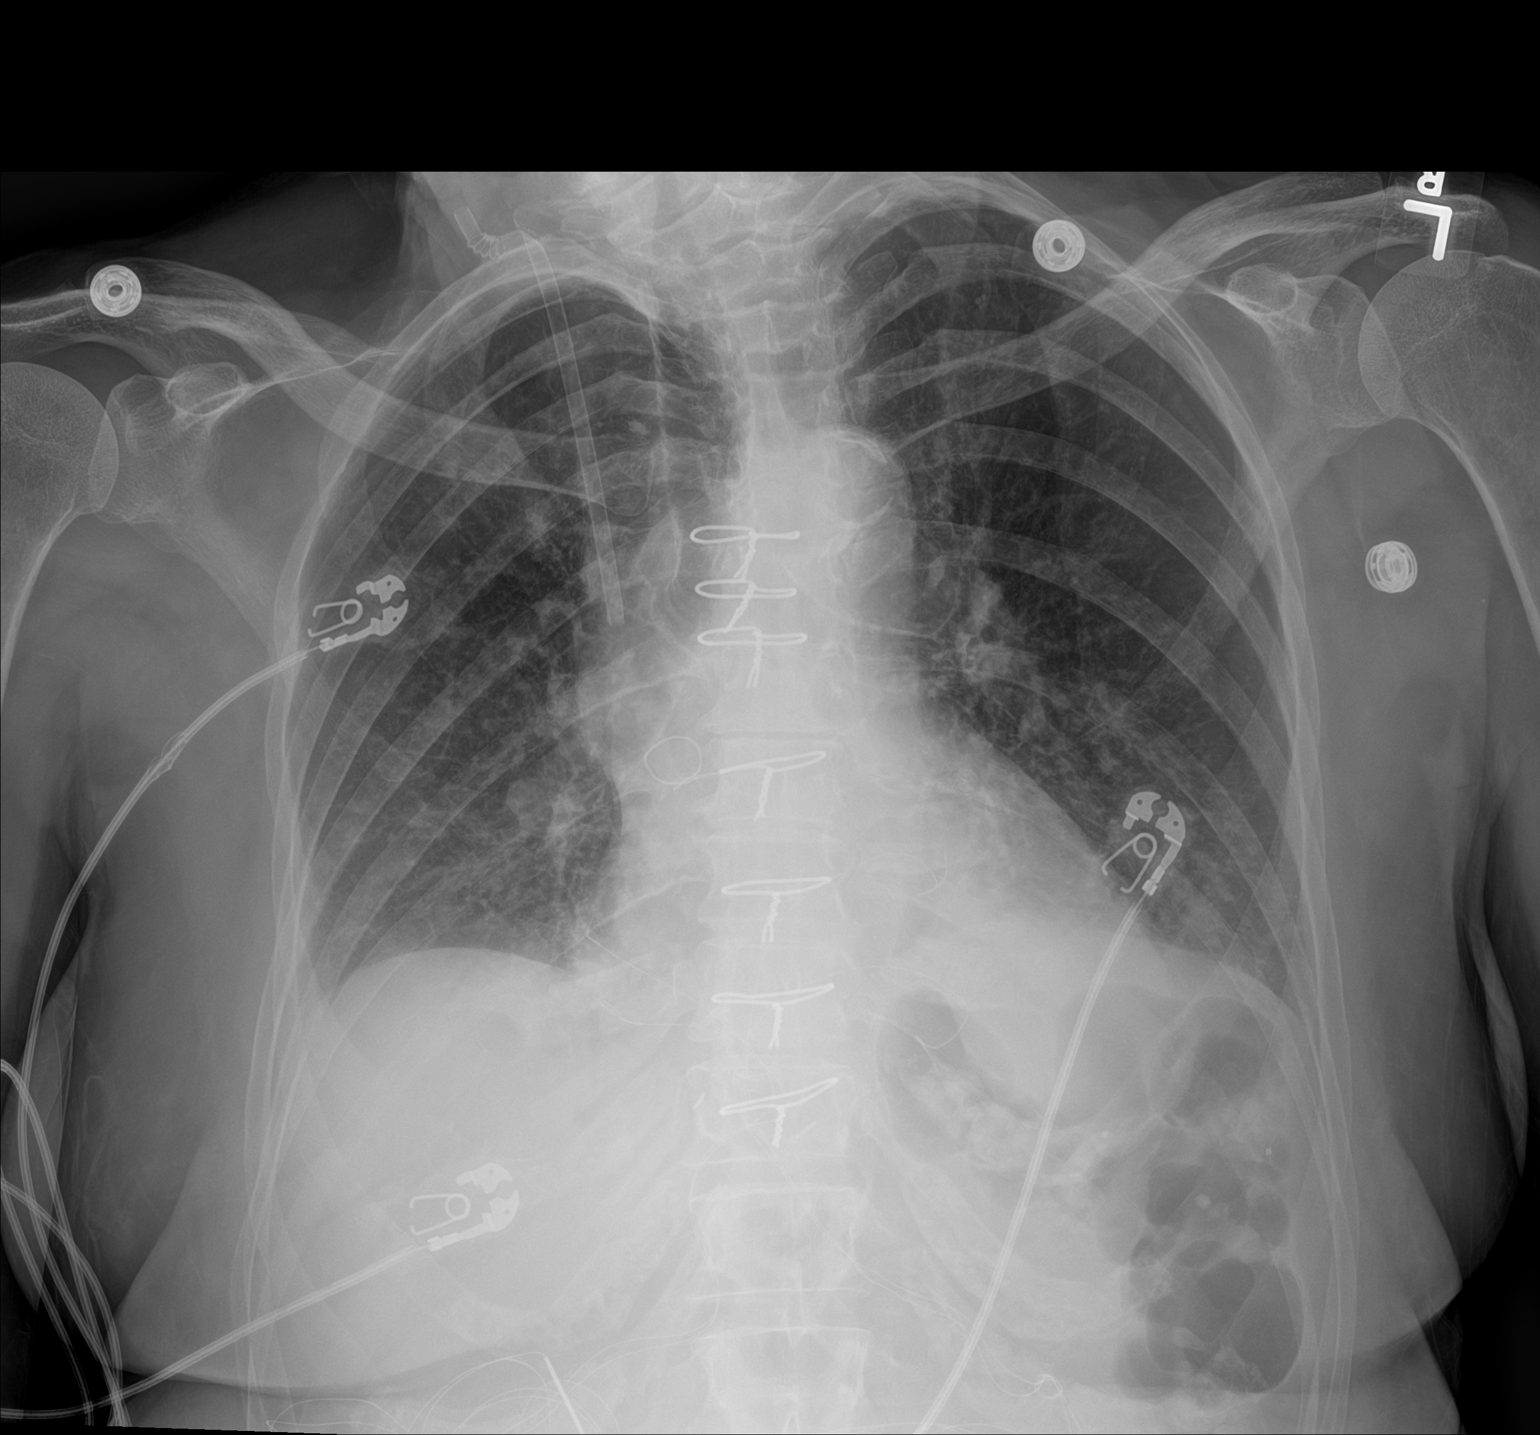

[1 of 1 positions shown; findings below may reference images not displayed]

FINDINGS: Swan-Ganz catheter has been removed. Chest tubes have been removed.
The right IJ sheath is clamped. Mild atelectasis at the bases.
Normal heart size. CABG.
IMPRESSION: Mild atelectasis.  No visible pneumothorax.

## 2019-06-30 MED ORDER — SODIUM CHLORIDE 0.9% FLUSH: 3.0000 mL | INTRAVENOUS | Status: DC | PRN

## 2019-06-30 MED ORDER — POTASSIUM CHLORIDE CRYS ER 20 MEQ PO TBCR
20.0000 meq | EXTENDED_RELEASE_TABLET | Freq: Two times a day (BID) | ORAL | Status: DC
  Administered 2019-07-01 – 2019-07-02 (×3): 20 meq via ORAL
  Filled 2019-06-30 (×3): qty 1

## 2019-06-30 MED ORDER — BISACODYL 5 MG PO TBEC: 10.0000 mg | DELAYED_RELEASE_TABLET | Freq: Every day | ORAL | Status: DC | PRN

## 2019-06-30 MED ORDER — ONDANSETRON HCL 4 MG/2ML IJ SOLN: 4.0000 mg | Freq: Four times a day (QID) | INTRAMUSCULAR | Status: DC | PRN

## 2019-06-30 MED ORDER — FUROSEMIDE 40 MG PO TABS
40.0000 mg | ORAL_TABLET | Freq: Every day | ORAL | Status: DC
  Administered 2019-07-01 – 2019-07-02 (×2): 40 mg via ORAL
  Filled 2019-06-30 (×2): qty 1

## 2019-06-30 MED ORDER — PANTOPRAZOLE SODIUM 40 MG PO TBEC
40.0000 mg | DELAYED_RELEASE_TABLET | Freq: Every day | ORAL | Status: DC
  Administered 2019-07-01 – 2019-07-02 (×2): 40 mg via ORAL
  Filled 2019-06-30 (×2): qty 1

## 2019-06-30 MED ORDER — ACETAMINOPHEN 325 MG PO TABS: 650.0000 mg | ORAL_TABLET | Freq: Four times a day (QID) | ORAL | Status: DC | PRN

## 2019-06-30 MED ORDER — METOPROLOL TARTRATE 12.5 MG HALF TABLET
12.5000 mg | ORAL_TABLET | Freq: Two times a day (BID) | ORAL | Status: DC
  Administered 2019-06-30 – 2019-07-02 (×4): 12.5 mg via ORAL
  Filled 2019-06-30 (×4): qty 1

## 2019-06-30 MED ORDER — SODIUM CHLORIDE 0.9 % IV SOLN: 250.0000 mL | INTRAVENOUS | Status: DC | PRN

## 2019-06-30 MED ORDER — POTASSIUM CHLORIDE CRYS ER 20 MEQ PO TBCR
20.0000 meq | EXTENDED_RELEASE_TABLET | Freq: Once | ORAL | Status: AC
  Administered 2019-06-30: 20 meq via ORAL
  Filled 2019-06-30: qty 1

## 2019-06-30 MED ORDER — OXYCODONE HCL 5 MG PO TABS: 5.0000 mg | ORAL_TABLET | ORAL | Status: DC | PRN

## 2019-06-30 MED ORDER — DOCUSATE SODIUM 100 MG PO CAPS: 200.0000 mg | ORAL_CAPSULE | Freq: Every day | ORAL | Status: DC

## 2019-06-30 MED ORDER — ONDANSETRON HCL 4 MG PO TABS: 4.0000 mg | ORAL_TABLET | Freq: Four times a day (QID) | ORAL | Status: DC | PRN

## 2019-06-30 MED ORDER — DOCUSATE SODIUM 100 MG PO CAPS
200.0000 mg | ORAL_CAPSULE | Freq: Every day | ORAL | Status: DC
  Filled 2019-06-30: qty 2

## 2019-06-30 MED ORDER — ~~LOC~~ CARDIAC SURGERY, PATIENT & FAMILY EDUCATION: Freq: Once | Status: DC

## 2019-06-30 MED ORDER — TRAMADOL HCL 50 MG PO TABS: 50.0000 mg | ORAL_TABLET | Freq: Four times a day (QID) | ORAL | Status: DC | PRN

## 2019-06-30 MED ORDER — ASPIRIN EC 325 MG PO TBEC
325.0000 mg | DELAYED_RELEASE_TABLET | Freq: Every day | ORAL | Status: DC
  Administered 2019-07-01 – 2019-07-02 (×2): 325 mg via ORAL
  Filled 2019-06-30 (×2): qty 1

## 2019-06-30 MED ORDER — FUROSEMIDE 10 MG/ML IJ SOLN
40.0000 mg | Freq: Once | INTRAMUSCULAR | Status: AC
  Administered 2019-06-30: 40 mg via INTRAVENOUS
  Filled 2019-06-30: qty 4

## 2019-06-30 MED ORDER — SODIUM CHLORIDE 0.9% FLUSH
3.0000 mL | Freq: Two times a day (BID) | INTRAVENOUS | Status: DC
  Administered 2019-06-30 – 2019-07-01 (×3): 3 mL via INTRAVENOUS

## 2019-06-30 MED ORDER — BISACODYL 10 MG RE SUPP: 10.0000 mg | Freq: Every day | RECTAL | Status: DC | PRN

## 2019-06-30 NOTE — Anesthesia Postprocedure Evaluation (Signed)
Anesthesia Post Note  [name]  Procedure(s) Performed: CORONARY ARTERY BYPASS GRAFTING (CABG) using endoscopic harvest right saphenous vein to the posterior lateral (PLB). (N/A Chest) TRANSESOPHAGEAL ECHOCARDIOGRAM (TEE) (N/A )     Patient location during evaluation: SICU Anesthesia Type: General Level of consciousness: sedated Pain management: pain level controlled Vital Signs Assessment: post-procedure vital signs reviewed and stable Respiratory status: patient remains intubated per anesthesia plan Cardiovascular status: stable Postop Assessment: no apparent nausea or vomiting Anesthetic complications: no    Last Vitals:  Vitals:   06/30/19 1100 06/30/19 1120  BP: (!) 127/59   Pulse: 94   Resp: 18   Temp:  36.9 C  SpO2: 95%     Last Pain:  Vitals:   06/30/19 1120  TempSrc: Oral  PainSc:                  Sartell S

## 2019-06-30 NOTE — Progress Notes (Signed)
CARDIAC REHAB PHASE I   PRE:  Rate/Rhythm: 105 ST    BP: sitting 107/53    SaO2: 97 RA  MODE:  Ambulation: 630 ft   POST:  Rate/Rhythm: 128 ST    BP: sitting 128/53     SaO2: 98 RA  Pt doing very well. Able to stand and walk with RW. Increased distance. HR elevated but no c/o.  [id number]   Darrick Meigs CES, ACSM 06/30/2019 2:46 PM

## 2019-06-30 NOTE — Progress Notes (Signed)
2 Days Post-Op Procedure(s) (LRB): CORONARY ARTERY BYPASS GRAFTING (CABG) using endoscopic harvest right saphenous vein to the posterior lateral (PLB). (N/A) TRANSESOPHAGEAL ECHOCARDIOGRAM (TEE) (N/A) Subjective: Feeling better, nausea resolved yesterday afternoon. Ambulated this am.  Objective: Vital signs in last 24 hours: Temp:  [98 F (36.7 C)-98.9 F (37.2 C)] 98.6 F (37 C) (10/28 0351) Pulse Rate:  [77-105] 105 (10/28 0700) Cardiac Rhythm: Normal sinus rhythm (10/27 2000) Resp:  [10-25] 16 (10/28 0700) BP: (102-144)/(48-76) 129/66 (10/28 0700) SpO2:  [92 %-100 %] 97 % (10/28 0700) Weight:  [57.6 kg] 57.6 kg (10/28 0600)  Hemodynamic parameters for last 24 hours:    Intake/Output from previous day: 10/27 0701 - 10/28 0700 In: 893.3 [P.O.:240; I.V.:453.2; IV Piggyback:200.1] Out: 840 [Urine:800; Chest Tube:40] Intake/Output this shift: No intake/output data recorded.  General appearance: alert and cooperative Neurologic: intact Heart: regular rate and rhythm, S1, S2 normal, no murmur, click, rub or gallop Lungs: clear to auscultation bilaterally Extremities: edema mild Wound: dressings dry  Lab Results: Recent Labs    06/29/19 1647 06/30/19 0436  WBC 14.0* 12.4*  HGB 10.6* 9.7*  HCT 33.5* 30.8*  PLT 196 178   BMET:  Recent Labs    06/29/19 1647 06/30/19 0436  NA 136 137  K 4.1 4.2  CL 105 106  CO2 23 24  GLUCOSE 181* 114*  BUN 13 16  CREATININE 0.69 0.71  CALCIUM 8.3* 8.1*    PT/INR:  Recent Labs    06/28/19 1109  LABPROT 16.9*  INR 1.4*   ABG    Component Value Date/Time   PHART 7.337 (L) 06/28/2019 1734   HCO3 24.7 06/28/2019 1734   TCO2 26 06/28/2019 1734   ACIDBASEDEF 2.0 06/28/2019 1734   O2SAT 100.0 06/28/2019 1734   CBG (last 3)  Recent Labs    06/29/19 0407 06/29/19 0803 06/29/19 1149  GLUCAP 136* 166* 172*   CXR: mild bibasilar atelectasis  Assessment/Plan: S/P Procedure(s) (LRB): CORONARY ARTERY BYPASS GRAFTING  (CABG) using endoscopic harvest right saphenous vein to the posterior lateral (PLB). (N/A) TRANSESOPHAGEAL ECHOCARDIOGRAM (TEE) (N/A)  POD 2 Hemodynamically stable in sinus rhythm. Resume low dose Lopressor.  Volume excess: wt is 10 lbs over preop. Start diuresis.  DC sleeve and foley.  Transfer to 4E and continue mobilization.   LOS: 6 days    Rachel Valentine 06/30/2019

## 2019-06-30 NOTE — Progress Notes (Signed)
Patient arrived to 4E room 16 at this time. Telemetry applied and CCMD notified. V/s and assessment complete. Patient oriented to room and how to call nurse with any needs.   Emelda Fear, RN

## 2019-07-01 LAB — TYPE AND SCREEN
ABO/RH(D): O POS
Antibody Screen: NEGATIVE
Unit division: 0
Unit division: 0
Unit division: 0
Unit division: 0

## 2019-07-01 LAB — BPAM RBC
Blood Product Expiration Date: 202011022359
Blood Product Expiration Date: 202011022359
Blood Product Expiration Date: 202011282359
Blood Product Expiration Date: 202011282359
ISSUE DATE / TIME: 202010260905
ISSUE DATE / TIME: 202010260905
Unit Type and Rh: 5100
Unit Type and Rh: 5100
Unit Type and Rh: 5100
Unit Type and Rh: 5100

## 2019-07-01 MED FILL — Mannitol IV Soln 20%: INTRAVENOUS | Qty: 500 | Status: AC

## 2019-07-01 MED FILL — Lidocaine HCl Local Soln Prefilled Syringe 100 MG/5ML (2%): INTRAMUSCULAR | Qty: 5 | Status: AC

## 2019-07-01 MED FILL — Sodium Bicarbonate IV Soln 8.4%: INTRAVENOUS | Qty: 50 | Status: AC

## 2019-07-01 MED FILL — Sodium Chloride IV Soln 0.9%: INTRAVENOUS | Qty: 2000 | Status: AC

## 2019-07-01 MED FILL — Heparin Sodium (Porcine) Inj 1000 Unit/ML: INTRAMUSCULAR | Qty: 10 | Status: AC

## 2019-07-01 MED FILL — Electrolyte-R (PH 7.4) Solution: INTRAVENOUS | Qty: 3000 | Status: AC

## 2019-07-01 NOTE — Progress Notes (Signed)
Care plan reviewed, Pt's progressing. Pt appeared alert and oriented x 4. She ambulated to restroom with one standby assisted, tolerated well. CCMD called to notify that her HR went up to 130 with unsustained sinus tachycardia on the monitor when she's ambulating.   At rest her HR 80s, BP 104/56- 129/71 mmHg, SPO2 93-97% on room air. Denied pain, no acute distress noted. She slept well tonight. Continue to monitor.   Kennyth Lose, RN

## 2019-07-01 NOTE — Plan of Care (Signed)
Continue to monitor

## 2019-07-01 NOTE — Progress Notes (Signed)
CARDIAC REHAB PHASE I   Pt seen ambulating multiple laps independently in hallway with front wheel walker. Pt states she feels good today. Max HR 121. Will follow-up tomorrow.   Rufina Falco, RN BSN 07/01/2019 2:48 PM

## 2019-07-01 NOTE — Progress Notes (Signed)
CARDIAC REHAB PHASE I   Pt seen ambulating in hallway independently with front wheel walker. HR 104 ST. Pt denies SOB and fatigue. Will follow up as to walk with pt as time allows.  Rufina Falco, RN BSN 07/01/2019 10:51 AM

## 2019-07-01 NOTE — Progress Notes (Addendum)
3 Days Post-Op Procedure(s) (LRB): CORONARY ARTERY BYPASS GRAFTING (CABG) using endoscopic harvest right saphenous vein to the posterior lateral (PLB). (N/A) TRANSESOPHAGEAL ECHOCARDIOGRAM (TEE) (N/A) Subjective: Feels good and doing well with ambulation. No dyspnea on RA.  Pulls 1500cc on incentive spirometer.   Objective: Vital signs in last 24 hours: Temp:  [97.6 F (36.4 C)-98.5 F (36.9 C)] 98 F (36.7 C) (10/29 0631) Pulse Rate:  [84-111] 100 (10/29 0631) Cardiac Rhythm: Normal sinus rhythm (10/29 0000) Resp:  [15-20] 15 (10/29 0631) BP: (102-138)/(52-93) 117/54 (10/29 0631) SpO2:  [90 %-99 %] 96 % (10/29 0631)     Intake/Output from previous day: 10/28 0701 - 10/29 0700 In: 510 [P.O.:490; I.V.:20] Out: 2050 [Urine:2050] Intake/Output this shift: No intake/output data recorded.  General appearance: alert, cooperative and no distress Neurologic: intact Heart: Stable SR. Lungs: Breath sounds CTA.  Abdomen: Soft, NT. Extremities: no edema. RLE EVH incision is intact and dry.  Wound: Dressing remove from the sternotomy incision. Wound is well approximated and dry. The skin edge of one of the mediastinal tube insertions sites is oozing and had sturated the dressings placed yesterday morining. The wound was cleaned with sterile saline and a new pressure dressing was applied to the site.   Lab Results: Recent Labs    06/29/19 1647 06/30/19 0436  WBC 14.0* 12.4*  HGB 10.6* 9.7*  HCT 33.5* 30.8*  PLT 196 178   BMET:  Recent Labs    06/29/19 1647 06/30/19 0436  NA 136 137  K 4.1 4.2  CL 105 106  CO2 23 24  GLUCOSE 181* 114*  BUN 13 16  CREATININE 0.69 0.71  CALCIUM 8.3* 8.1*    PT/INR:  Recent Labs    06/28/19 1109  LABPROT 16.9*  INR 1.4*   ABG    Component Value Date/Time   PHART 7.337 (L) 06/28/2019 1734   HCO3 24.7 06/28/2019 1734   TCO2 26 06/28/2019 1734   ACIDBASEDEF 2.0 06/28/2019 1734   O2SAT 100.0 06/28/2019 1734   CBG (last 3)   Recent Labs    06/29/19 0407 06/29/19 0803 06/29/19 1149  GLUCAP 136* 166* 172*    Assessment/Plan: S/P Procedure(s) (LRB): CORONARY ARTERY BYPASS GRAFTING (CABG) using endoscopic harvest right saphenous vein to the posterior lateral (PLB). (N/A) TRANSESOPHAGEAL ECHOCARDIOGRAM (TEE) (N/A)  -POD-3 CABG x 1--making excellent progress with mobility and cardiac rhythm is stable.   -Expected acute blood loss anemia, mild. Recheck lab in AM.   -Volume excess- Wt still ~4kg above pre-op. Continue diuresis with oral Lasix.   -Anticipate she will be ready for discharge in 1-2-days.    LOS: 7 days    Antony Odea, Hershal Coria [id number] 07/01/2019   Chart reviewed, patient examined, agree with above. She is ambulating well and feels well. Bowels working. Plan home tomorrow if no changes.

## 2019-07-01 NOTE — Care Management Important Message (Signed)
Important Message  Patient Details  Name: Rachel Valentine MRN: [id number] Date of Birth: 10/13/1940   Medicare Important Message Given:  Yes     Shelda Altes 07/01/2019, 1:41 PM

## 2019-07-02 LAB — BASIC METABOLIC PANEL
Anion gap: 9 (ref 5–15)
BUN: 13 mg/dL (ref 8–23)
CO2: 26 mmol/L (ref 22–32)
Calcium: 8.5 mg/dL — ABNORMAL LOW (ref 8.9–10.3)
Chloride: 105 mmol/L (ref 98–111)
Creatinine, Ser: 0.64 mg/dL (ref 0.44–1.00)
GFR calc Af Amer: >60 mL/min (ref >60)
GFR calc non Af Amer: >60 mL/min (ref >60)
Glucose, Bld: 120 mg/dL — ABNORMAL HIGH (ref 70–99)
Potassium: 3.7 mmol/L (ref 3.5–5.1)
Sodium: 140 mmol/L (ref 135–145)

## 2019-07-02 LAB — CBC
HCT: 30.5 % — ABNORMAL LOW (ref 36.0–46.0)
Hemoglobin: 10.1 g/dL — ABNORMAL LOW (ref 12.0–15.0)
MCH: 30 pg (ref 26.0–34.0)
MCHC: 33.1 g/dL (ref 30.0–36.0)
MCV: 90.5 fL (ref 80.0–100.0)
Platelets: 209 10*3/uL (ref 150–400)
RBC: 3.37 MIL/uL — ABNORMAL LOW (ref 3.87–5.11)
RDW: 15.4 % (ref 11.5–15.5)
WBC: 8.6 10*3/uL (ref 4.0–10.5)
nRBC: 0 % (ref 0.0–0.2)

## 2019-07-02 MED ORDER — OXYCODONE-ACETAMINOPHEN 5-325 MG PO TABS: 1.0000 | ORAL_TABLET | ORAL | 0 refills | Status: AC | PRN

## 2019-07-02 NOTE — Progress Notes (Signed)
PT provided discharge instructions and education. IV removed and intact. Pt telebox removed/ccmd notified. Pt denies any complaints. Pt vitals stable. Pt has all belongings. Pt tx via wheelchair to meet ride at [place].  Jerald Kief, RN

## 2019-07-02 NOTE — Progress Notes (Signed)
EPW removed per order. Pt tolerated well. Vitals monitored q15 min x 1 hr. Pt agrees to 1 hr of bedrest. Will continue to monitor.  Jerald Kief, RN

## 2019-07-02 NOTE — Progress Notes (Signed)
4 Days Post-Op Procedure(s) (LRB): CORONARY ARTERY BYPASS GRAFTING (CABG) using endoscopic harvest right saphenous vein to the posterior lateral (PLB). (N/A) TRANSESOPHAGEAL ECHOCARDIOGRAM (TEE) (N/A) Subjective: Feels good, no new problems or concerns. She feels like she is ready for discharge to home today.   Objective: Vital signs in last 24 hours: Temp:  [97.8 F (36.6 C)-98.5 F (36.9 C)] 98.5 F (36.9 C) (10/30 0412) Pulse Rate:  [85-105] 85 (10/30 0412) Cardiac Rhythm: Sinus tachycardia;Normal sinus rhythm (10/30 0420) Resp:  [14-22] 17 (10/30 0412) BP: (110-129)/(52-66) 110/61 (10/30 0412) SpO2:  [95 %-99 %] 95 % (10/30 0412) Weight:  [54 kg] 54 kg (10/30 0425)     Intake/Output from previous day: No intake/output data recorded. Intake/Output this shift: No intake/output data recorded.  Physical Exam General appearance: alert, cooperative and no distress Neurologic: intact Heart: Stable SR. Lungs: Breath sounds clear  Abdomen: Soft, NT. Extremities: no edema. RLE EVH incision is intact and dry.  Wound: The sternotomy incision is intact and dry. The CT sites are covered with a dry dressing.   Lab Results: Recent Labs    06/30/19 0436 07/02/19 0300  WBC 12.4* 8.6  HGB 9.7* 10.1*  HCT 30.8* 30.5*  PLT 178 209   BMET:  Recent Labs    06/30/19 0436 07/02/19 0300  NA 137 140  K 4.2 3.7  CL 106 105  CO2 24 26  GLUCOSE 114* 120*  BUN 16 13  CREATININE 0.71 0.64  CALCIUM 8.1* 8.5*    PT/INR: No results for input(s): LABPROT, INR in the last 72 hours. ABG    Component Value Date/Time   PHART 7.337 (L) 06/28/2019 1734   HCO3 24.7 06/28/2019 1734   TCO2 26 06/28/2019 1734   ACIDBASEDEF 2.0 06/28/2019 1734   O2SAT 100.0 06/28/2019 1734   CBG (last 3)  Recent Labs    06/29/19 0803 06/29/19 1149  GLUCAP 166* 172*    Assessment/Plan: S/P Procedure(s) (LRB): CORONARY ARTERY BYPASS GRAFTING (CABG) using endoscopic harvest right saphenous vein to  the posterior lateral (PLB). (N/A) TRANSESOPHAGEAL ECHOCARDIOGRAM (TEE) (N/A)  -POD-4 CABG x 1--making excellent progress with mobility and cardiac rhythm is stable.   -Expected acute blood loss anemia, mild. Hct stable.    -Volume excess-Near pre-op weight. She does not appear to need further diuresis.   -Ready for discharge today. Instructions given.   LOS: 8 days    Antony Odea, PA-C [phone] 07/02/2019

## 2019-07-02 NOTE — Progress Notes (Signed)
CARDIAC REHAB PHASE I   D/c education completed with pt. Pt educated on importance of showers and monitoring incisions. Encouraged continued IS use, walks, and sternal precautions. Pt given heart healthy diet. Reviewed restrictions and exercise guidelines. Will refer to CRP II [place]. Pt is interested in participating in Virtual Cardiac and Pulmonary Rehab. Pt advised that Virtual Cardiac and Pulmonary Rehab is provided at no cost to the patient.  Checklist:  1. Pt has smart device  ie smartphone and/or ipad for downloading an app  Yes 2. Reliable internet/wifi service    Yes 3. Understands how to use their smartphone and navigate within an app.  Yes  Pt verbalized understanding and is in agreement.  [id number] Rufina Falco, RN BSN 07/02/2019 9:21 AM

## 2019-07-02 NOTE — Progress Notes (Addendum)
CARDIOLOGY RECOMMENDATIONS:  Discharge is anticipated in the next 48 hours. Recommendations for medications and follow up:  Discharge Medications:  Continue medications as they are currently listed in the [facility]. Exceptions to the above:  none  Follow Up: The patient's Primary Cardiologist is Quay Burow, MD  Follow up in the office has been made for 07/13/19 at 9:30 am with Coletta Memos NP.  Signed,  Tami Lin [place], [name]  11:38 AM 07/02/2019  [facility]

## 2019-07-07 ENCOUNTER — Telehealth (HOSPITAL_COMMUNITY): Payer: Self-pay

## 2019-07-07 ENCOUNTER — Ambulatory Visit: Payer: Medicare HMO | Admitting: General Practice

## 2019-07-07 NOTE — Telephone Encounter (Signed)
Called patient to see if she is interested in the Cardiac/Virtual Rehab Program. Patient expressed interest. Explained scheduling process and went over insurance, patient verbalized understanding. Will contact patient for scheduling once f/u has been completed.

## 2019-07-07 NOTE — Telephone Encounter (Signed)
Pt insurance is active and benefits verified through [facility]. Co-pay $45.00, DED $0.00/$0.00 met, out of pocket $4,200.00/$878.74 met, co-insurance 0%. No pre-authorization required. Tom/[employer], 07/07/2019 @ 11:01AM, HKI#[id number]  Will contact patient to see if she is interested in the Cardiac Rehab Program. If interested, patient will need to complete follow up appt. Once completed, patient will be contacted for scheduling upon review by the RN Navigator.

## 2019-07-08 ENCOUNTER — Telehealth: Payer: Self-pay | Admitting: General Practice

## 2019-07-08 NOTE — Telephone Encounter (Signed)
Did not need this encounter °

## 2019-07-12 ENCOUNTER — Other Ambulatory Visit: Payer: Self-pay

## 2019-07-12 ENCOUNTER — Ambulatory Visit (INDEPENDENT_AMBULATORY_CARE_PROVIDER_SITE_OTHER): Payer: Self-pay | Admitting: *Deleted

## 2019-07-12 DIAGNOSIS — I2510 Atherosclerotic heart disease of native coronary artery without angina pectoris: Secondary | ICD-10-CM

## 2019-07-12 DIAGNOSIS — Z951 Presence of aortocoronary bypass graft: Secondary | ICD-10-CM

## 2019-07-12 DIAGNOSIS — Z4802 Encounter for removal of sutures: Secondary | ICD-10-CM

## 2019-07-12 NOTE — Progress Notes (Signed)
Cardiology Clinic Note   Patient Name: Rachel Valentine Date of Encounter: 07/13/2019  Primary Care Provider:  [name], MD Primary Cardiologist:  Quay Burow, MD  Patient Profile    Rachel Valentine is a 78 y.o. female  presents today for follow-up of her coronary artery disease status post CABG x1.  Past Medical History    Past Medical History:  Diagnosis Date  . Bowel obstruction [facility])    Past Surgical History:  Procedure Laterality Date  . ABDOMINAL HYSTERECTOMY    . ABDOMINAL SURGERY    . APPENDECTOMY    . AUGMENTATION MAMMAPLASTY Bilateral    implants removed in 2005  . BREAST EXCISIONAL BIOPSY Left    benign  . CORONARY ARTERY BYPASS GRAFT N/A 06/28/2019   Procedure: CORONARY ARTERY BYPASS GRAFTING (CABG) using endoscopic harvest right saphenous vein to the posterior lateral (PLB).;  Surgeon: Gaye Pollack, MD;  Location: [place] [facility];  Service: Open Heart Surgery;  Laterality: N/A;  . CORONARY ATHERECTOMY N/A 01/05/2019   Procedure: CORONARY ATHERECTOMY - STENT;  Surgeon: Burnell Blanks, MD;  Location: [place] [facility];  Service: Cardiovascular;  Laterality: N/A;  . CORONARY STENT INTERVENTION N/A 01/04/2019   Procedure: CORONARY STENT INTERVENTION;  Surgeon: Leonie Man, MD;  Location: [place] [facility];  Service: Cardiovascular;  Laterality: N/A;  . LEFT HEART CATH AND CORONARY ANGIOGRAPHY N/A 01/04/2019   Procedure: LEFT HEART CATH AND CORONARY ANGIOGRAPHY;  Surgeon: Leonie Man, MD;  Location: [place] [facility];  Service: Cardiovascular;  Laterality: N/A;  . LEFT HEART CATH AND CORONARY ANGIOGRAPHY N/A 06/24/2019   Procedure: LEFT HEART CATH AND CORONARY ANGIOGRAPHY;  Surgeon: [name], MD;  Location: [place] [facility];  Service: Cardiovascular;  Laterality: N/A;  . TEE WITHOUT CARDIOVERSION N/A 06/28/2019   Procedure: TRANSESOPHAGEAL ECHOCARDIOGRAM (TEE);  Surgeon: Gaye Pollack, MD;  Location: [place];  Service: Open  Heart Surgery;  Laterality: N/A;  . TEMPORARY PACEMAKER N/A 01/05/2019   Procedure: TEMPORARY PACEMAKER;  Surgeon: Burnell Blanks, MD;  Location: [place] [facility];  Service: Cardiovascular;  Laterality: N/A;    Allergies  Allergies  Allergen Reactions  . Fosamax [Alendronate] Nausea Only    Unset stomach  . Prednisone Other (See Comments)    Blood pressure high when she takes it  . Penicillins Rash    Did it involve swelling of the face/tongue/throat, SOB, or low BP? No Did it involve sudden or severe rash/hives, skin peeling, or any reaction on the inside of your mouth or nose? No Did you need to seek medical attention at a hospital or doctor's office? Yes When did it last happen?Childhood If all above answers are "NO", may proceed with cephalosporin use.    History of Present Illness    Rachel Valentine is a 78 y.o. female with a hx of unstable angina, hypertension, dyslipidemia, coronary artery disease and CABG x1 (06/28/2019).  She was admitted to [facility] on 01/01/2019 for unstable angina and ruled out for MI.  She underwent cardiac catheterization on 01/04/2019 revealing normal LV function, 90 percent circumflex lesion and a high-grade diffuse calcified RCA stenosis with normal LAD.  She underwent an LAD intervention by Dr. Ellyn Hack receiving a mid circumflex DES  x1.  She  then received an arthrectomy and  DES x2 to her  mid and distal RCA on 01/06/2019 by Dr. Angelena Form.  She was discharged on 01/07/2019.  During 05/12/2019 follow-up visit she felt well,  denied chest pain and was compliant with her aspirin and Plavix as well as her high dose Lipitor and beta-blocker.  She had resumed her normal walking activities of 2.5 to 4 miles a day 6-7 times per week. She called the clinic on 06/11/2019 and stated that she had been having palpitations with episodes of chest pain.   She was seen by me on 06/15/2019.  During that time she was having intermittent episodes of chest pain and  palpitations.  Her palpitations were at times associated with her chest pain.  Her chest pain was at times responsive to nitroglycerin and she continued her walking regimen.  By 06/21/2019 her chest pain had progressed and was limiting her physical activity.  She underwent cardiac catheterization on 06/24/2019 which showed proximal RCA 99% stenosis, mid RCA 50% stenosis, and OST RCA proximal 70% stenosis.  RCA PCI was attempted but was unsuccessful in treating her in-stent restenosis.  CTS was consulted for CABG evaluation.  She underwent successful CABG x1 on 06/28/2019, SVG to mid PDA, and was discharged on 07/02/2019.  She presents to the clinic today and states she feels well.  She has been walking about 20 minutes daily at a light pace.  She states she has noticed brief intermittent chest discomfort that goes away with rest.  She does describe these pains as sharp and lasting for seconds.  She states that this is nothing like the chest discomfort that she had prior to her repeat catheterization or CABG surgery.   She was reassured that these are likely due to healing from surgery.  I will resume her Plavix at this time as well and ordered a CBC, BMP.  She denies  shortness of breath, lower extremity edema, fatigue, palpitations, melena, hematuria, hemoptysis, diaphoresis, weakness, presyncope, syncope, orthopnea, and PND.   Home Medications    Prior to Admission medications   Medication Sig Start Date End Date Taking? Authorizing Provider  amLODipine (NORVASC) 2.5 MG tablet Take 1 tablet (2.5 mg total) by mouth at bedtime. 06/15/19 09/13/19  Deberah Pelton, NP  aspirin EC 81 MG tablet Take 1 tablet (81 mg total) by mouth daily. 01/06/19 01/06/20  Lavina Hamman, MD  Biotin 10000 MCG TABS Take 10,000 mg by mouth daily.     [provider]  Calcium-Phosphorus-Vitamin D (CITRACAL +D3 PO) Take 1 tablet by mouth daily.     [provider]  diphenhydrAMINE (BENADRYL) 25 mg capsule Take  25 mg by mouth at bedtime as needed for allergies.     [provider]  metoprolol tartrate (LOPRESSOR) 25 MG tablet Take 0.5 tablets (12.5 mg total) by mouth 2 (two) times daily. 01/06/19   Lavina Hamman, MD  nitroGLYCERIN (NITROSTAT) 0.4 MG SL tablet Place 1 tablet (0.4 mg total) under the tongue every 5 (five) minutes as needed for chest pain. 01/06/19   Lavina Hamman, MD  pantoprazole (PROTONIX) 40 MG tablet Take 1 tablet (40 mg total) by mouth daily. Patient taking differently: Take 40 mg by mouth at bedtime.  06/15/19   Deberah Pelton, NP  Propylene Glycol (SYSTANE BALANCE) 0.6 % SOLN Place 2 drops into both eyes as needed (Dry eye).    [provider]  rosuvastatin (CRESTOR) 40 MG tablet Take 40 mg by mouth daily.  06/10/19   [provider]    Family History    Family History  Problem Relation Age of Onset  . Heart failure Mother   . Stroke Father   .  Stroke Brother    She indicated that her mother is deceased. She indicated that her father is deceased. She indicated that her brother is deceased.  Social History    Social History   Socioeconomic History  . Marital status: Widowed    Spouse name: Not on file  . Number of children: Not on file  . Years of education: Not on file  . Highest education level: Not on file  Occupational History  . Not on file  Social Needs  . Financial resource strain: Not on file  . Food insecurity    Worry: Not on file    Inability: Not on file  . Transportation needs    Medical: Not on file    Non-medical: Not on file  Tobacco Use  . Smoking status: Former Smoker    Quit date: 01/06/1999    Years since quitting: 20.5  . Smokeless tobacco: Never Used  Substance and Sexual Activity  . Alcohol use: No  . Drug use: Not on file  . Sexual activity: Not on file  Lifestyle  . Physical activity    Days per week: Not on file    Minutes per session: Not on file  . Stress: Not on file  Relationships  . Social  Herbalist on phone: Not on file    Gets together: Not on file    Attends religious service: Not on file    Active member of club or organization: Not on file    Attends meetings of clubs or organizations: Not on file    Relationship status: Not on file  . Intimate partner violence    Fear of current or ex partner: Not on file    Emotionally abused: Not on file    Physically abused: Not on file    Forced sexual activity: Not on file  Other Topics Concern  . Not on file  Social History Narrative  . Not on file     Review of Systems    General:  No chills, fever, night sweats or weight changes.  Cardiovascular:  No chest pain, dyspnea on exertion, edema, orthopnea, palpitations, paroxysmal nocturnal dyspnea. Dermatological: No rash, lesions/masses Respiratory: No cough, dyspnea Urologic: No hematuria, dysuria Abdominal:   No nausea, vomiting, diarrhea, bright red blood per rectum, melena, or hematemesis Neurologic:  No visual changes, wkns, changes in mental status. All other systems reviewed and are otherwise negative except as noted above.  Physical Exam    VS:  BP 128/60   Pulse 67   Ht 5\' 2"  (1.575 m)   Wt 117 lb 9.6 oz (53.3 kg)   BMI 21.51 kg/m  , BMI Body mass index is 21.51 kg/m. GEN: Well nourished, well developed, in no acute distress. HEENT: normal. Neck: Supple, no JVD, carotid bruits, or masses. Cardiac: RRR, no murmurs, rubs, or gallops. No clubbing, cyanosis, edema.  Radials/DP/PT 2+ and equal bilaterally.  Respiratory:  Respirations regular and unlabored, clear to auscultation bilaterally. GI: Soft, nontender, nondistended, BS + x 4. MS: no deformity or atrophy. Skin: warm and dry, no rash. Neuro:  Strength and sensation are intact. Psych: Normal affect.  Accessory Clinical Findings    ECG personally reviewed by me today-normal sinus rhythm at possible left atrial enlargement 67 bpm- No acute changes  EKG 07/06/2019 Normal sinus rhythm 82  bpm  EKG 06/15/2019 normal sinus rhythm 62 bpm  Cardiac catheterization 06/24/2019  Prox RCA lesion is 99% stenosed.  Prox RCA to Mid RCA  lesion is 50% stenosed.  Ost RCA to Prox RCA lesion is 70% stenosed.  Previously placed Prox Cx to Mid Cx stent (unknown type) is widely patent.  Ost Cx to Prox Cx lesion is 40% stenosed.  Mid Cx to Dist Cx lesion is 40% stenosed.  Ost LM to Mid LM lesion is 40% stenosed.   Echocardiogram 06/28/2019 (intraoperative) Complications: No known complications during this procedure. POST-OP IMPRESSIONS Overall, there were no significant changes from pre-bypass.  PRE-OP FINDINGS  Left Ventricle: The left ventricle has normal systolic function, with an ejection fraction of 55-60%. The cavity size was normal. There is no increase in left ventricular wall thickness.  Right Ventricle: The right ventricle has normal systolic function. The cavity was normal. There is no increase in right ventricular wall thickness.  Left Atrium: Left atrial size was normal in size.  Right Atrium: Right atrial size was normal in size. Right atrial pressure is estimated at 10 mmHg.  Interatrial Septum: No atrial level shunt detected by color flow Doppler.  Pericardium: There is no evidence of pericardial effusion.  Mitral Valve: The mitral valve is normal in structure. Mitral valve regurgitation is mild by color flow Doppler.  Tricuspid Valve: The tricuspid valve was normal in structure. Tricuspid valve regurgitation is trivial by color flow Doppler.  Aortic Valve: The aortic valve is normal in structure. Aortic valve regurgitation is trivial by color flow Doppler. There is no evidence of aortic valve stenosis.  Pulmonic Valve: The pulmonic valve was normal in structure No evidence of pumonic stenosis. Pulmonic valve regurgitation is trivial by color flow Doppler.  Aorta: The aortic root, ascending aorta and aortic arch are normal in size and structure.   Echocardiogram 01/02/2019 IMPRESSIONS   1. The left ventricle has hyperdynamic systolic function, with an ejection fraction of >65%. The cavity size was normal. Left ventricular diastolic Doppler parameters are indeterminate. 2. The right ventricle has normal systolic function. The cavity was normal. There is no increase in right ventricular wall thickness. 3. The mitral valve is grossly normal. There is mild mitral annular calcification present. 4. The aortic valve is tricuspid. Mild thickening of the aortic valve. Mild calcification of the aortic valve. No stenosis of the aortic valve. 5. The aortic root is normal in size and structure. 6. The interatrial septum was not assessed.  Assessment & Plan   1.  CAD-s/p CABG x1 06/28/2019, SVG to mid PDA, and was discharged on 07/02/2019.  Cardiac catheterization 06/24/2019 which showed proximal RCA 99% stenosis, mid RCA 50% stenosis, and OST RCA proximal 70% stenosis.  RCA PCI was attempted but was unsuccessful in treating her in-stent restenosis.  Continue aspirin 81 mg tablet daily Continue amlodipine 2.5 mg daily Continue metoprolol tartrate 12.5 mg twice daily Continue nitroglycerin 0.4 mg sublingual as needed Continue rosuvastatin 40 mg tablet daily Slowly increase physical activity-continue sternal precautions Heart healthy low-sodium diet Resume Plavix 75 mg daily BMP, CBC   Hyperlipidemia-06/27/2019: Cholesterol 104; HDL 49; LDL Cholesterol 41; Triglycerides 68; VLDL 14 Continue rosuvastatin 40 mg tablet daily Heart healthy low-sodium high-fiber diet Slowly increase physical activity   Essential hypertension- BP today 128/60.  Continue metoprolol tartrate 12.5 mg twice daily Continue amlodipine 2.5 mg daily Heart healthy low-sodium high-fiber diet Maintain physical activity  Palpitations-no further episodes of palpitations.  Ekg today shows  normal sinus rhythm possible left atrial enlargement 67 bpm Continue  metoprolol tartrate 12.5 mg twice daily  Follow-up with [facility] Dr. Cyndia Bent on 07/28/2019.  Disposition: Follow-up with Dr.  Berry in 3 months.   Jossie Ng. [facility] [address] [address] Office [phone] Fax [phone]

## 2019-07-12 NOTE — Progress Notes (Signed)
Rachel Valentine returns to the office s/p CABG X 1 on 06/28/19.  Appetite and bowels are good.  There are no issues with medications.  The operative incisions are all healing well.  She does complain of intermittent swelling of her right ankle, the EVH leg.  Proper elvation techniques discussed.  She is walking everyday. I removed two remaining chest tube sutures very easily.  She will return as scheduled with a CXR.

## 2019-07-13 ENCOUNTER — Encounter: Payer: Self-pay | Admitting: General Practice

## 2019-07-13 ENCOUNTER — Ambulatory Visit: Payer: Medicare HMO | Admitting: General Practice

## 2019-07-13 ENCOUNTER — Telehealth (HOSPITAL_COMMUNITY): Payer: Self-pay

## 2019-07-13 ENCOUNTER — Encounter (HOSPITAL_COMMUNITY)
Admission: RE | Admit: 2019-07-13 | Discharge: 2019-07-13 | Disposition: A | Discharge status: Home / Self Care | Payer: Medicare HMO | Source: Ambulatory Visit | Attending: Cardiovascular Disease | Admitting: Cardiovascular Disease

## 2019-07-13 VITALS — BP 128/60 | HR 67 | Ht 62.0 in | Wt 117.6 lb

## 2019-07-13 DIAGNOSIS — E785 Hyperlipidemia, unspecified: Secondary | ICD-10-CM | POA: Diagnosis not present

## 2019-07-13 DIAGNOSIS — I2510 Atherosclerotic heart disease of native coronary artery without angina pectoris: Secondary | ICD-10-CM

## 2019-07-13 DIAGNOSIS — Z9861 Coronary angioplasty status: Secondary | ICD-10-CM

## 2019-07-13 DIAGNOSIS — I10 Essential (primary) hypertension: Secondary | ICD-10-CM

## 2019-07-13 DIAGNOSIS — Z951 Presence of aortocoronary bypass graft: Secondary | ICD-10-CM

## 2019-07-13 DIAGNOSIS — R002 Palpitations: Secondary | ICD-10-CM

## 2019-07-13 LAB — BASIC METABOLIC PANEL
BUN/Creatinine Ratio: 21 (ref 12–28)
BUN: 15 mg/dL (ref 8–27)
CO2: 24 mmol/L (ref 20–29)
Calcium: 10 mg/dL (ref 8.7–10.3)
Chloride: 104 mmol/L (ref 96–106)
Creatinine, Ser: 0.7 mg/dL (ref 0.57–1.00)
GFR calc Af Amer: 97 mL/min/{1.73_m2} (ref >59)
GFR calc non Af Amer: 84 mL/min/{1.73_m2} (ref >59)
Glucose: 85 mg/dL (ref 65–99)
Potassium: 5 mmol/L (ref 3.5–5.2)
Sodium: 142 mmol/L (ref 134–144)

## 2019-07-13 LAB — CBC
Hematocrit: 31.4 % — ABNORMAL LOW (ref 34.0–46.6)
Hemoglobin: 10.3 g/dL — ABNORMAL LOW (ref 11.1–15.9)
MCH: 29.9 pg (ref 26.6–33.0)
MCHC: 32.8 g/dL (ref 31.5–35.7)
MCV: 91 fL (ref 79–97)
Platelets: 514 10*3/uL — ABNORMAL HIGH (ref 150–450)
RBC: 3.45 x10E6/uL — ABNORMAL LOW (ref 3.77–5.28)
RDW: 14.5 % (ref 11.7–15.4)
WBC: 8.5 10*3/uL (ref 3.4–10.8)

## 2019-07-13 MED ORDER — CLOPIDOGREL BISULFATE 75 MG PO TABS: 75.0000 mg | ORAL_TABLET | Freq: Every day | ORAL | 3 refills | Status: DC

## 2019-07-13 NOTE — Telephone Encounter (Signed)

## 2019-07-13 NOTE — Patient Instructions (Signed)
Medication Instructions:  RESTART- Plavix 75 mg by mouth daily  *If you need a refill on your cardiac medications before your next appointment, please call your pharmacy*  Lab Work: BMP Today  If you have labs (blood work) drawn today and your tests are completely normal, you will receive your results only by: Marland Kitchen MyChart Message (if you have MyChart) OR . A paper copy in the mail If you have any lab test that is abnormal or we need to change your treatment, we will call you to review the results.  Testing/Procedures: None Ordered  Follow-Up: At [facility], you and your health needs are our priority.  As part of our continuing mission to provide you with exceptional heart care, we have created designated Provider Care Teams.  These Care Teams include your primary Cardiologist (physician) and Advanced Practice Providers (APPs -  Physician Assistants and Nurse Practitioners) who all work together to provide you with the care you need, when you need it.  Your next appointment:   3 months  The format for your next appointment:   In Person  Provider:   Quay Burow, MD

## 2019-07-13 NOTE — Telephone Encounter (Signed)
Spoke to pt regarding Virtual Cardiac  and Pulmonary Rehab.  Pt  was able to download the [facility] app on their smart device with no issues. Pt set up their account and received the following welcome message -"Welcome to the [facility] program. We hope that you will find the exercise program beneficial in your recovery process. Our staff is available to assist with any questions/concerns about your exercise routine. Best wishes". Brief orientation provided to with the advisement to watch the "Intro to Rehab" series located under the Resource tab. Pt verbalized understanding. Will continue to follow and monitor pt progress with feedback as needed.  

## 2019-07-26 ENCOUNTER — Other Ambulatory Visit: Payer: Self-pay | Admitting: Surgery

## 2019-07-26 DIAGNOSIS — Z951 Presence of aortocoronary bypass graft: Secondary | ICD-10-CM

## 2019-07-28 ENCOUNTER — Ambulatory Visit (INDEPENDENT_AMBULATORY_CARE_PROVIDER_SITE_OTHER): Payer: Self-pay | Admitting: Surgery

## 2019-07-28 ENCOUNTER — Encounter: Payer: Self-pay | Admitting: Surgery

## 2019-07-28 ENCOUNTER — Other Ambulatory Visit: Payer: Self-pay

## 2019-07-28 ENCOUNTER — Ambulatory Visit
Admission: RE | Admit: 2019-07-28 | Discharge: 2019-07-28 | Disposition: A | Discharge status: Home / Self Care | Payer: Medicare HMO | Source: Ambulatory Visit | Attending: Surgery | Admitting: Surgery

## 2019-07-28 VITALS — BP 128/75 | HR 80 | Temp 97.7°F | Resp 20 | Wt 114.0 lb

## 2019-07-28 DIAGNOSIS — Z951 Presence of aortocoronary bypass graft: Secondary | ICD-10-CM

## 2019-07-28 DIAGNOSIS — I2510 Atherosclerotic heart disease of native coronary artery without angina pectoris: Secondary | ICD-10-CM

## 2019-07-28 DIAGNOSIS — R079 Chest pain, unspecified: Secondary | ICD-10-CM | POA: Diagnosis not present

## 2019-07-28 IMAGING — CR DG CHEST 2V
2 series · 2 of 2 positions shown · non-contrast
Comparison: [DATE] and earlier.

CLINICAL DATA: 78-year-old female status post CABG in [REDACTED]. Left
chest pain for 2 days.

EXAM:
CHEST - 2 VIEW

[w chest pa]
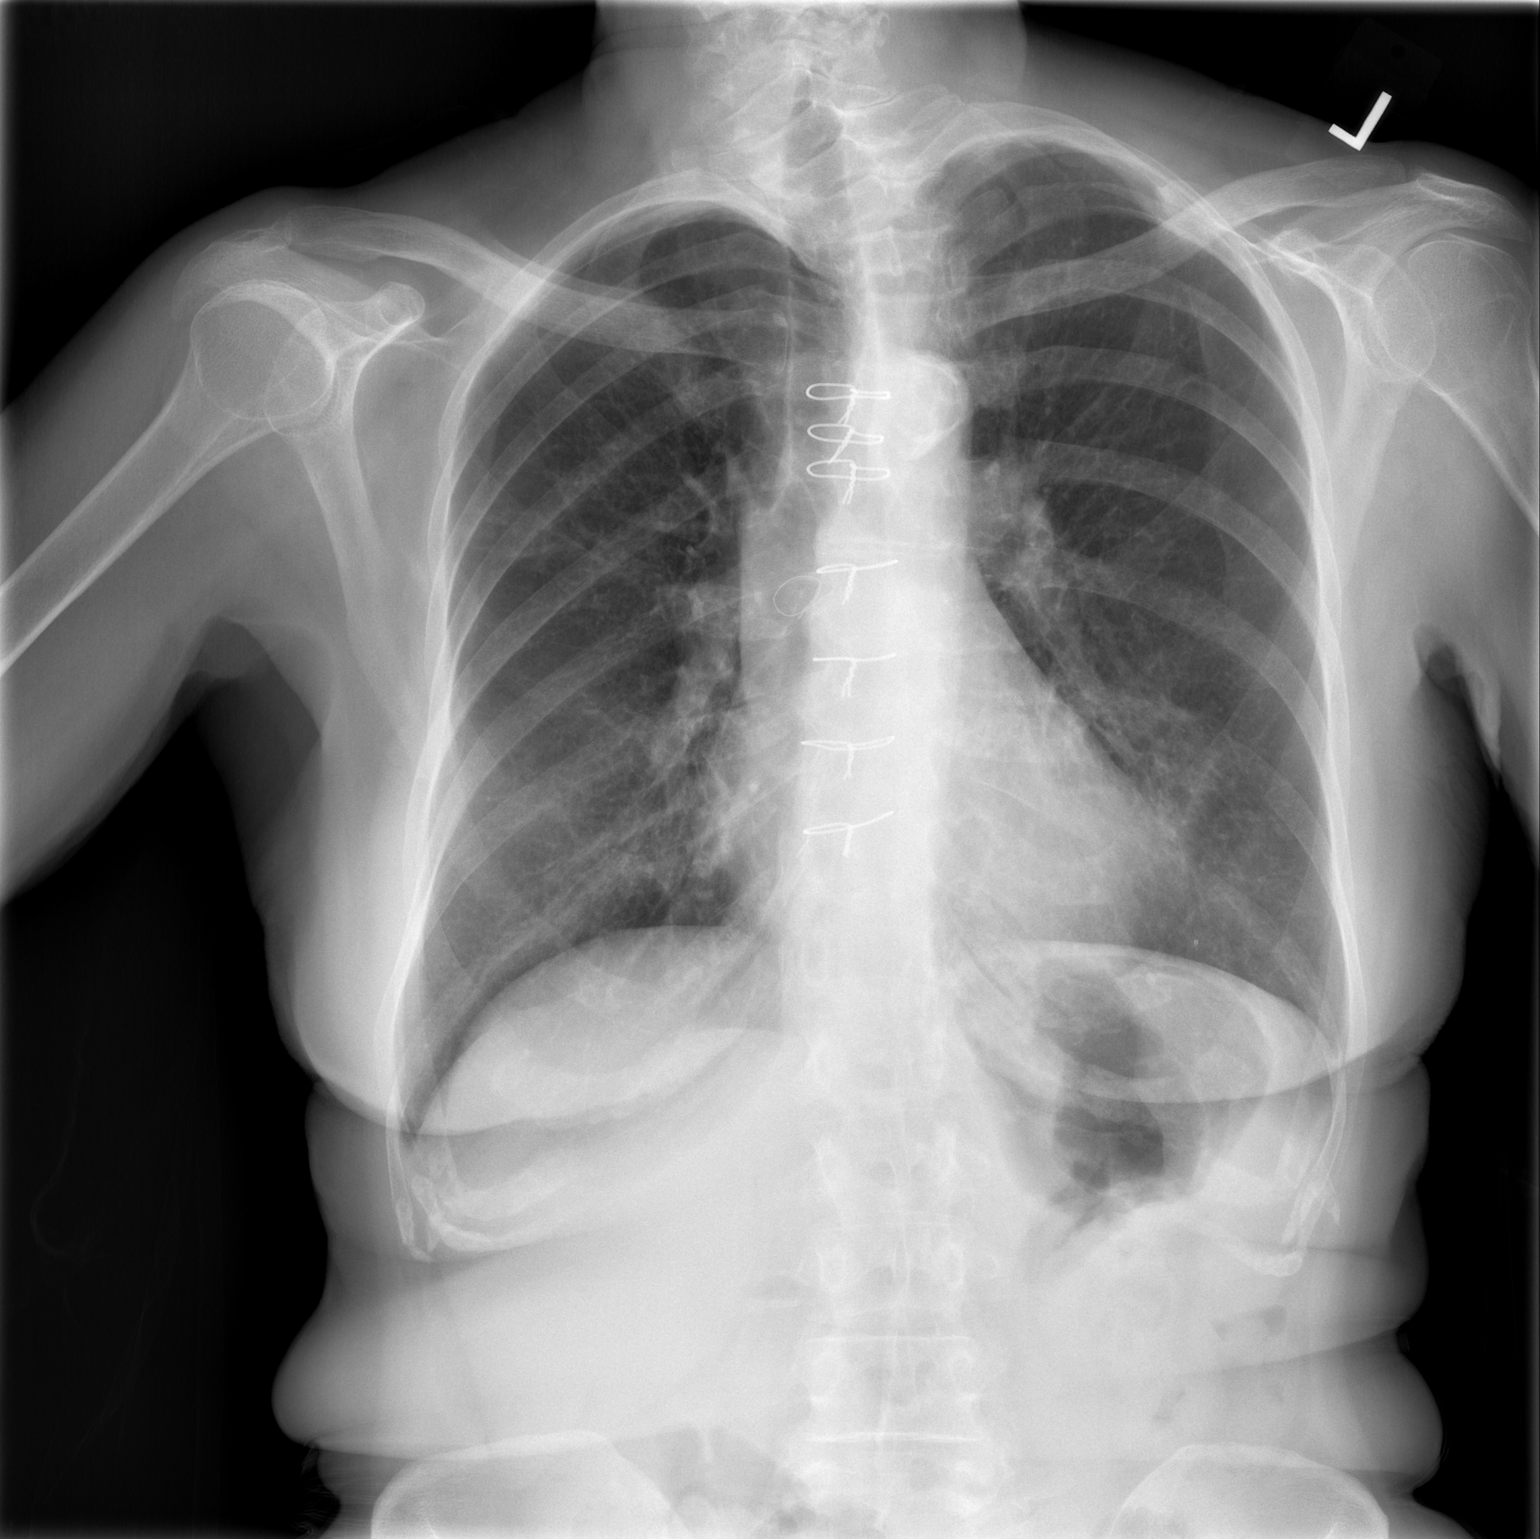

[w chest lat]
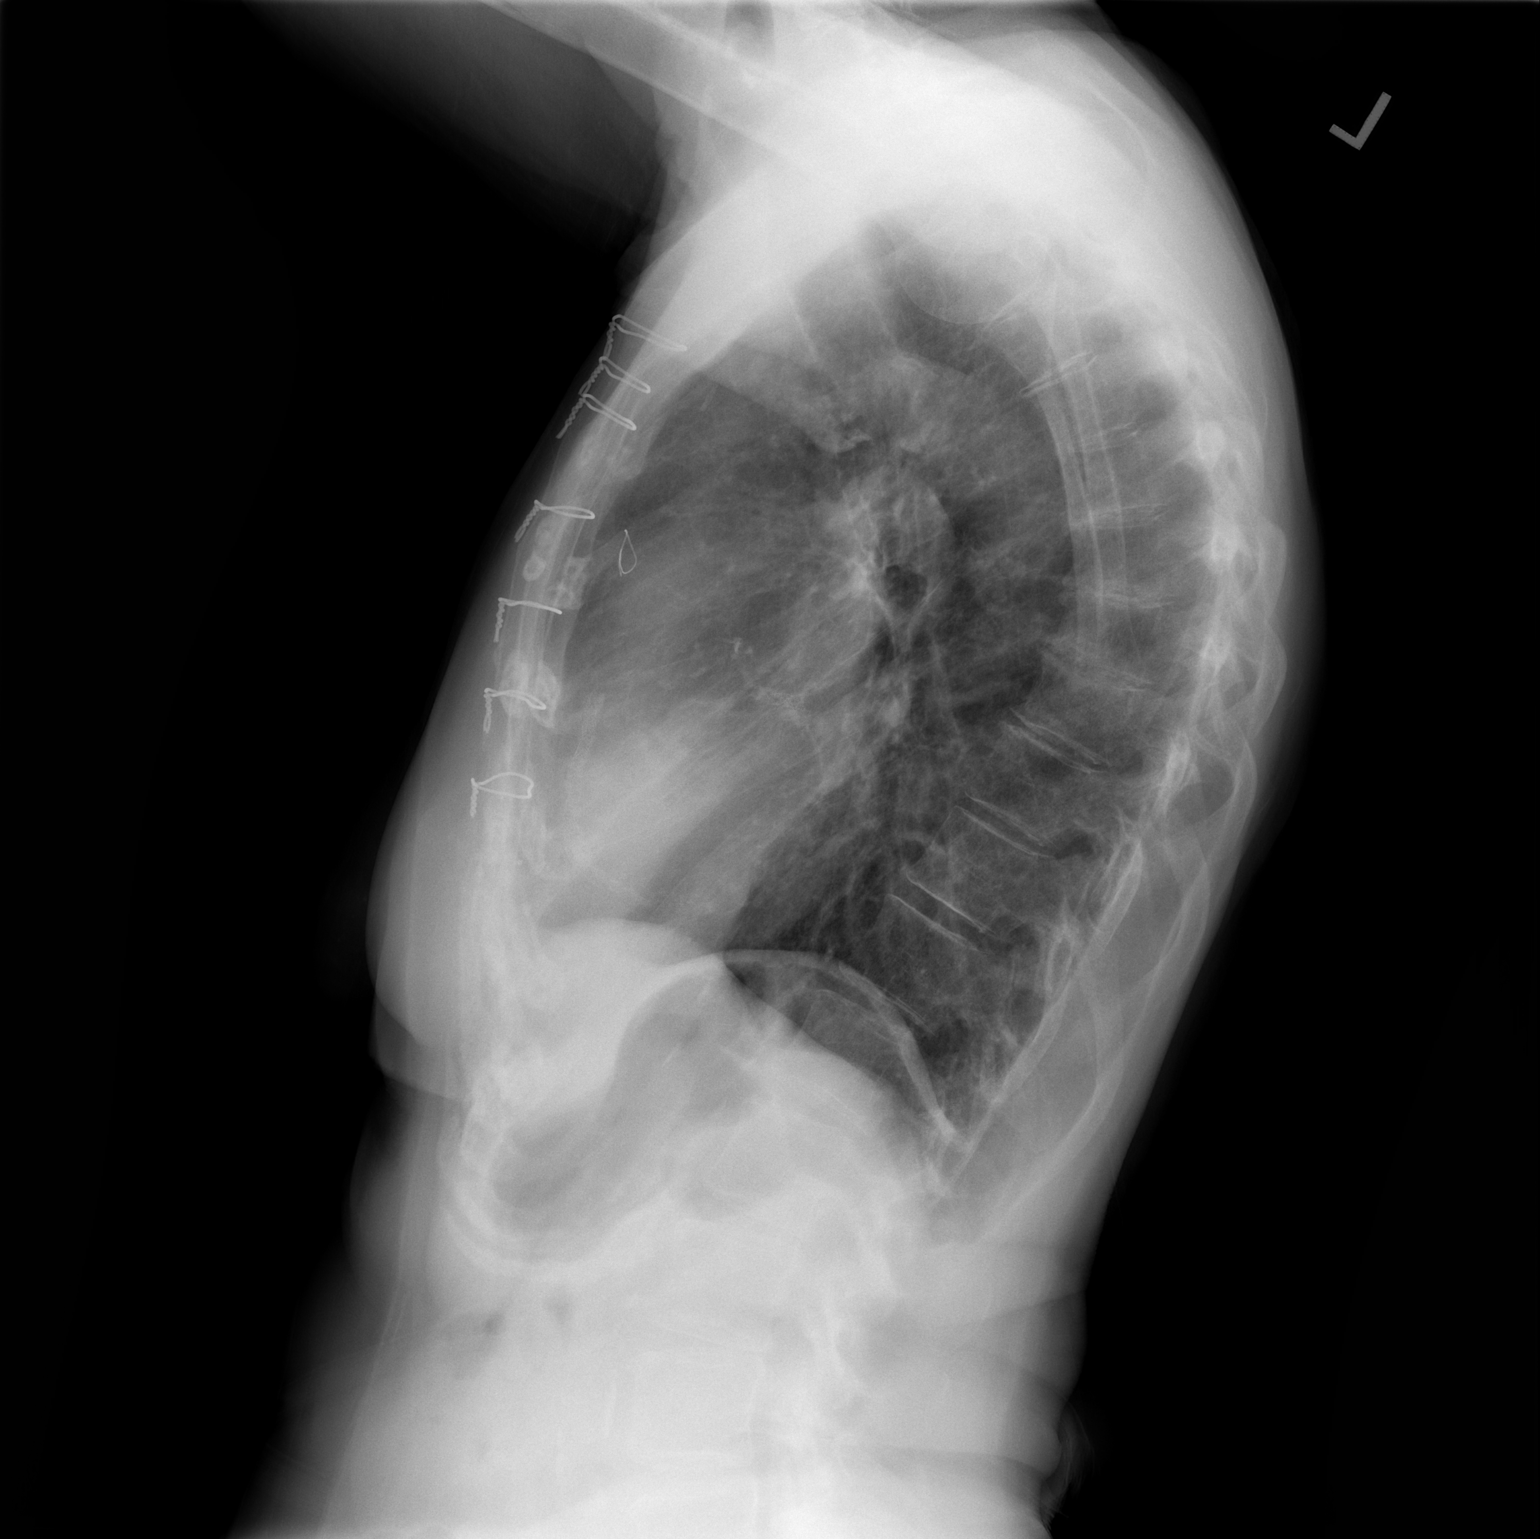

[2 of 2 positions shown; findings below may reference images not displayed]

FINDINGS: PA and lateral views. Right IJ introducer sheath and epicardial
pacer wires removed since the prior study. Lung volumes at baseline.
Prior CABG. Other mediastinal contours are within normal limits.
Visualized tracheal air column is within normal limits. No
pneumothorax, pulmonary edema, pleural effusion or confluent
pulmonary opacity. No acute osseous abnormality identified.
Abdominal Calcified aortic atherosclerosis. Negative visible bowel
gas pattern.
IMPRESSION: 1. No acute cardiopulmonary abnormality.
2. Prior CABG.   Aortic Atherosclerosis ([QD]-[QD]).

## 2019-07-28 NOTE — Progress Notes (Signed)
HPI: Patient returns for routine postoperative follow-up having undergone coronary artery bypass graft surgery x1 with a saphenous vein graft to the posterior descending coronary artery on 06/28/2019. The patient's early postoperative recovery while in the hospital was notable for an uncomplicated postoperative course.   Since hospital discharge the patient reports that she has been feeling well.  She has been walking for at least 20 minutes/day without chest pain or shortness of breath.  Her only complaint is of some occasional sharp twinges to the left upper sternum which are not exertionally related and different from her previous angina.     Current Outpatient Medications  Medication Sig Dispense Refill  . amLODipine (NORVASC) 2.5 MG tablet Take 1 tablet (2.5 mg total) by mouth at bedtime. 30 tablet 6  . aspirin EC 81 MG tablet Take 1 tablet (81 mg total) by mouth daily. 150 tablet 0  . Biotin 10000 MCG TABS Take 10,000 mg by mouth daily.     . Calcium-Phosphorus-Vitamin D (CITRACAL +D3 PO) Take 1 tablet by mouth daily.     . clopidogrel (PLAVIX) 75 MG tablet Take 1 tablet (75 mg total) by mouth daily. 90 tablet 3  . diphenhydrAMINE (BENADRYL) 25 mg capsule Take 25 mg by mouth at bedtime as needed for allergies.     . metoprolol tartrate (LOPRESSOR) 25 MG tablet Take 0.5 tablets (12.5 mg total) by mouth 2 (two) times daily. 60 tablet 0  . nitroGLYCERIN (NITROSTAT) 0.4 MG SL tablet Place 1 tablet (0.4 mg total) under the tongue every 5 (five) minutes as needed for chest pain. 30 tablet 0  . pantoprazole (PROTONIX) 40 MG tablet Take 1 tablet (40 mg total) by mouth daily. (Patient taking differently: Take 40 mg by mouth at bedtime. ) 30 tablet 6  . Propylene Glycol (SYSTANE BALANCE) 0.6 % SOLN Place 2 drops into both eyes as needed (Dry eye).    . rosuvastatin (CRESTOR) 40 MG tablet Take 40 mg by mouth daily.      No current facility-administered medications for this visit.     Physical  Exam: BP 128/75 (BP Location: Right Arm)   Pulse 80   Temp 97.7 F (36.5 C) (Skin)   Resp 20   Wt 114 lb (51.7 kg)   SpO2 97% Comment: RA  BMI 20.85 kg/m  She looks well. Cardiac exam shows a regular rate and rhythm with normal heart sounds. Lung exam is clear. The chest incision is healing well and the sternum is stable. Her right leg incision is healing well and there is no peripheral edema.  Diagnostic Tests:  CLINICAL DATA:  78 year old female status post CABG in October. Left chest pain for 2 days.  EXAM: CHEST - 2 VIEW  COMPARISON:  06/30/2019 and earlier.  FINDINGS: PA and lateral views. Right IJ introducer sheath and epicardial pacer wires removed since the prior study. Lung volumes at baseline. Prior CABG. Other mediastinal contours are within normal limits. Visualized tracheal air column is within normal limits. No pneumothorax, pulmonary edema, pleural effusion or confluent pulmonary opacity. No acute osseous abnormality identified. Abdominal Calcified aortic atherosclerosis. Negative visible bowel gas pattern.  IMPRESSION: 1. No acute cardiopulmonary abnormality. 2. Prior CABG.   Aortic Atherosclerosis (ICD10-I70.0).   Electronically Signed   By: Genevie Ann M.D.   On: 07/28/2019 11:54   Impression:  Overall I think she is making an excellent recovery following her surgery.  I told her that she can return to driving a car but should  refrain from lifting anything heavier than 10 pounds for 3 months postoperatively.  I reassured her that the twinges of pain that she has the left of her sternum sound musculoskeletal and are probably related to the sternotomy incision and the sternal wires.  This is clearly different than her previous angina and I would expect it to resolve with time.  Plan:  She will continue to follow-up with cardiology and I will plan to see her back if the need arises.   Gaye Pollack, MD [facility]  [phone]

## 2019-08-31 ENCOUNTER — Encounter (HOSPITAL_COMMUNITY)
Admission: RE | Admit: 2019-08-31 | Discharge: 2019-08-31 | Disposition: A | Discharge status: Home / Self Care | Payer: Medicare HMO | Source: Ambulatory Visit | Attending: Cardiovascular Disease | Admitting: Cardiovascular Disease

## 2019-08-31 NOTE — Progress Notes (Signed)
Patient is not active in the [facility] app. Spoke with patient, and patient is back to walking 3 miles daily with friends and is doing well.  Will discharge from the virtual cardiac rehab program. Patient's virtual cardiac rehab report will be scanned to media for review.  Sol Passer, MS, ACSM CEP 08/31/2019 LX:[id number]

## 2019-09-13 DIAGNOSIS — R69 Illness, unspecified: Secondary | ICD-10-CM | POA: Diagnosis not present

## 2019-10-01 ENCOUNTER — Other Ambulatory Visit: Payer: Self-pay | Admitting: General Practice

## 2019-10-01 DIAGNOSIS — R079 Chest pain, unspecified: Secondary | ICD-10-CM

## 2019-10-01 DIAGNOSIS — K2100 Gastro-esophageal reflux disease with esophagitis, without bleeding: Secondary | ICD-10-CM

## 2019-10-19 ENCOUNTER — Encounter: Payer: Self-pay | Admitting: Cardiovascular Disease

## 2019-10-19 ENCOUNTER — Ambulatory Visit: Payer: Medicare HMO | Admitting: Cardiovascular Disease

## 2019-10-19 ENCOUNTER — Other Ambulatory Visit: Payer: Self-pay

## 2019-10-19 DIAGNOSIS — E785 Hyperlipidemia, unspecified: Secondary | ICD-10-CM | POA: Diagnosis not present

## 2019-10-19 DIAGNOSIS — I2510 Atherosclerotic heart disease of native coronary artery without angina pectoris: Secondary | ICD-10-CM

## 2019-10-19 DIAGNOSIS — Z9861 Coronary angioplasty status: Secondary | ICD-10-CM

## 2019-10-19 NOTE — Patient Instructions (Signed)
Medication Instructions:  Your physician recommends that you continue on your current medications as directed. Please refer to the Current Medication list given to you today.  If you need a refill on your cardiac medications before your next appointment, please call your pharmacy.   Lab work: NONE  Testing/Procedures: NONE  Follow-Up: At [facility], you and your health needs are our priority.  As part of our continuing mission to provide you with exceptional heart care, we have created designated Provider Care Teams.  These Care Teams include your primary Cardiologist (physician) and Advanced Practice Providers (APPs -  Physician Assistants and Nurse Practitioners) who all work together to provide you with the care you need, when you need it. You may see Quay Burow, MD or one of the following Advanced Practice Providers on your designated Care Team:    Kerin Ransom, PA-C  [place], [place]  Coletta Memos, [place]  Your physician wants you to follow-up in: 3 months with Leonia Reader, NP and 6 months with Dr. Gwenlyn Found. You will receive a reminder letter in the mail two months in advance. If you don't receive a letter, please call our office to schedule the follow-up appointment.

## 2019-10-19 NOTE — Assessment & Plan Note (Signed)
History of CAD status post LAD intervention by Dr. Ellyn Hack with staged orbital atherectomy, PCI and stenting of the proximal mid and distal RCA by Dr. Angelena Form 01/06/2019.  Because of recurrent chest pain I recath her 06/24/2019 revealing high-grade in-stent restenosis within the RCA stent that I was unable to cross.  Based on this she underwent CABG x1 by Dr. Cyndia Bent with a vein graft to the RCA and was discharged home on 07/02/2019.  She is done well since and denies recurrent chest pain.

## 2019-10-19 NOTE — Progress Notes (Signed)
10/19/2019 Rachel Valentine   1941/06/21  [id number]  Primary Physician [name], MD Primary Cardiologist: [name] MD Lupe Carney, [place]  HPI:  Rachel Valentine is a 79 y.o.   widowed Caucasian female mother of 2, grandmother and one grandchild referred to me initially by Dr. Hosie Poisson, her prior PCP. 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   I last saw her virtually 05/12/2019.  She was having palpitations and chest pain. Her risk factors include hyperlipidemia and family history with a mother who had CABG. She is never had a heart attack or stroke. She was admitted to [facility] on 01/01/2023 unstable angina. She ruled out for myocardial infarction. She underwent cardiac catheterization by Dr. Ellyn Hack 01/04/2019 revealing normal LV function, high-grade mid AV groove circumflex on a band and high-grade diffuse calcified RCA stenosis with a fairly normal LAD. She underwent LAD intervention by Dr. Ellyn Hack with staged diamondback orbital rotational atherectomy, PCI and drug-eluting stenting of the proximal mid and distal RCA by Dr. Angelena Form on 01/06/2019. She was discharged home the following day. She is felt well and denies chest pain. She is on aspirin and Plavix as well as high-dose atorvastatin and a beta-blocker. She is a fairly active exerciser and currently is back walking 2.5 to 4 miles a day 6 to 7 days a week without limitation.  Since I saw her back in September last year she did undergo undergo outpatient radial diagnostic coronary angiography by myself 06/24/2019 revealing high-grade in-stent restenosis within in the previously placed RCA stents which I was unable to cross.  Based on this she underwent CABG x1 with a vein graft to the distal RCA by Dr. Cyndia Bent during the same hospitalization and was discharged home on 07/02/2019.  She is done well since.  She gets occasional  early evening palpitations.   Current Meds  Medication Sig  . amLODipine (NORVASC) 2.5 MG tablet TAKE 1 TABLET (2.5 MG TOTAL) BY MOUTH AT BEDTIME.  Marland Kitchen aspirin EC 81 MG tablet Take 1 tablet (81 mg total) by mouth daily.  . Biotin 10000 MCG TABS Take 10,000 mg by mouth daily.   . Calcium-Phosphorus-Vitamin D (CITRACAL +D3 PO) Take 1 tablet by mouth daily.   . clopidogrel (PLAVIX) 75 MG tablet Take 1 tablet (75 mg total) by mouth daily.  . diphenhydrAMINE (BENADRYL) 25 mg capsule Take 25 mg by mouth at bedtime as needed for allergies.   . metoprolol tartrate (LOPRESSOR) 25 MG tablet Take 0.5 tablets (12.5 mg total) by mouth 2 (two) times daily.  . nitroGLYCERIN (NITROSTAT) 0.4 MG SL tablet Place 1 tablet (0.4 mg total) under the tongue every 5 (five) minutes as needed for chest pain.  . pantoprazole (PROTONIX) 40 MG tablet TAKE 1 TABLET BY MOUTH EVERY DAY  . Propylene Glycol (SYSTANE BALANCE) 0.6 % SOLN Place 2 drops into both eyes as needed (Dry eye).  . rosuvastatin (CRESTOR) 40 MG tablet Take 40 mg by mouth daily.      Allergies  Allergen Reactions  . Fosamax [Alendronate] Nausea Only    Unset stomach  . Prednisone Other (See Comments)    Blood pressure high when she takes it  . Penicillins Rash    Did it involve swelling of the face/tongue/throat, SOB, or low BP? No Did it involve sudden or severe rash/hives, skin peeling, or any reaction on the inside of your mouth or nose?  No Did you need to seek medical attention at a hospital or doctor's office? Yes When did it last happen?Childhood If all above answers are "NO", may proceed with cephalosporin use.    Social History   Socioeconomic History  . Marital status: Widowed    Spouse name: Not on file  . Number of children: Not on file  . Years of education: Not on file  . Highest education level: Not on file  Occupational History  . Not on file  Tobacco Use  . Smoking status: Former Smoker    Quit date: 01/06/1999     Years since quitting: 20.7  . Smokeless tobacco: Never Used  Substance and Sexual Activity  . Alcohol use: No  . Drug use: Not on file  . Sexual activity: Not on file  Other Topics Concern  . Not on file  Social History Narrative  . Not on file   Social Determinants of Health   Financial Resource Strain:   . Difficulty of Paying Living Expenses: Not on file  Food Insecurity:   . Worried About Charity fundraiser in the Last Year: Not on file  . Ran Out of Food in the Last Year: Not on file  Transportation Needs:   . Lack of Transportation (Medical): Not on file  . Lack of Transportation (Non-Medical): Not on file  Physical Activity:   . Days of Exercise per Week: Not on file  . Minutes of Exercise per Session: Not on file  Stress:   . Feeling of Stress : Not on file  Social Connections:   . Frequency of Communication with Friends and Family: Not on file  . Frequency of Social Gatherings with Friends and Family: Not on file  . Attends Religious Services: Not on file  . Active Member of Clubs or Organizations: Not on file  . Attends Archivist Meetings: Not on file  . Marital Status: Not on file  Intimate Partner Violence:   . Fear of Current or Ex-Partner: Not on file  . Emotionally Abused: Not on file  . Physically Abused: Not on file  . Sexually Abused: Not on file     Review of Systems: General: negative for chills, fever, night sweats or weight changes.  Cardiovascular: negative for chest pain, dyspnea on exertion, edema, orthopnea, palpitations, paroxysmal nocturnal dyspnea or shortness of breath Dermatological: negative for rash Respiratory: negative for cough or wheezing Urologic: negative for hematuria Abdominal: negative for nausea, vomiting, diarrhea, bright red blood per rectum, melena, or hematemesis Neurologic: negative for visual changes, syncope, or dizziness All other systems reviewed and are otherwise negative except as noted  above.    Blood pressure 126/64, pulse 66, height 5\' 2"  (1.575 m), weight 111 lb 6.4 oz (50.5 kg).  General appearance: alert and no distress Neck: no adenopathy, no carotid bruit, no JVD, supple, symmetrical, trachea midline and thyroid not enlarged, symmetric, no tenderness/mass/nodules Lungs: clear to auscultation bilaterally Heart: regular rate and rhythm, S1, S2 normal, no murmur, click, rub or gallop Extremities: extremities normal, atraumatic, no cyanosis or edema Pulses: 2+ and symmetric Skin: Skin color, texture, turgor normal. No rashes or lesions Neurologic: Alert and oriented X 3, normal strength and tone. Normal symmetric reflexes. Normal coordination and gait  EKG not performed today  ASSESSMENT AND PLAN:   CAD S/P percutaneous coronary angioplasty History of CAD status post LAD intervention by Dr. Ellyn Hack with staged orbital atherectomy, PCI and stenting of the proximal mid and distal RCA by Dr.  McAlhany 01/06/2019.  Because of recurrent chest pain I recath her 06/24/2019 revealing high-grade in-stent restenosis within the RCA stent that I was unable to cross.  Based on this she underwent CABG x1 by Dr. Cyndia Bent with a vein graft to the RCA and was discharged home on 07/02/2019.  She is done well since and denies recurrent chest pain.  Dyslipidemia, goal LDL below 70 History of dyslipidemia on statin therapy with lipid profile performed 06/27/2019 revealing total cholesterol 104, LDL 41 and HDL 49.      [name] MD FACP,FACC,FAHA, [facility] 10/19/2019 10:45 AM

## 2019-10-19 NOTE — Assessment & Plan Note (Signed)
History of dyslipidemia on statin therapy with lipid profile performed 06/27/2019 revealing total cholesterol 104, LDL 41 and HDL 49.

## 2019-11-15 DIAGNOSIS — Z008 Encounter for other general examination: Secondary | ICD-10-CM | POA: Diagnosis not present

## 2019-11-15 DIAGNOSIS — I10 Essential (primary) hypertension: Secondary | ICD-10-CM | POA: Diagnosis not present

## 2019-11-15 DIAGNOSIS — Z8249 Family history of ischemic heart disease and other diseases of the circulatory system: Secondary | ICD-10-CM | POA: Diagnosis not present

## 2019-11-15 DIAGNOSIS — J309 Allergic rhinitis, unspecified: Secondary | ICD-10-CM | POA: Diagnosis not present

## 2019-11-15 DIAGNOSIS — I2510 Atherosclerotic heart disease of native coronary artery without angina pectoris: Secondary | ICD-10-CM | POA: Diagnosis not present

## 2019-11-15 DIAGNOSIS — Z7722 Contact with and (suspected) exposure to environmental tobacco smoke (acute) (chronic): Secondary | ICD-10-CM | POA: Diagnosis not present

## 2019-11-15 DIAGNOSIS — Z7902 Long term (current) use of antithrombotics/antiplatelets: Secondary | ICD-10-CM | POA: Diagnosis not present

## 2019-11-15 DIAGNOSIS — Z7982 Long term (current) use of aspirin: Secondary | ICD-10-CM | POA: Diagnosis not present

## 2019-11-15 DIAGNOSIS — K219 Gastro-esophageal reflux disease without esophagitis: Secondary | ICD-10-CM | POA: Diagnosis not present

## 2019-11-15 DIAGNOSIS — E785 Hyperlipidemia, unspecified: Secondary | ICD-10-CM | POA: Diagnosis not present

## 2019-11-15 DIAGNOSIS — Z823 Family history of stroke: Secondary | ICD-10-CM | POA: Diagnosis not present

## 2019-12-21 DIAGNOSIS — H5203 Hypermetropia, bilateral: Secondary | ICD-10-CM | POA: Diagnosis not present

## 2020-01-04 ENCOUNTER — Other Ambulatory Visit: Payer: Self-pay | Admitting: Family Medicine

## 2020-01-05 ENCOUNTER — Other Ambulatory Visit: Payer: Self-pay | Admitting: Family Medicine

## 2020-01-05 DIAGNOSIS — N6320 Unspecified lump in the left breast, unspecified quadrant: Secondary | ICD-10-CM

## 2020-01-07 ENCOUNTER — Ambulatory Visit
Admission: RE | Admit: 2020-01-07 | Discharge: 2020-01-07 | Disposition: A | Discharge status: Home / Self Care | Payer: Medicare HMO | Source: Ambulatory Visit | Attending: Family Medicine | Admitting: Family Medicine

## 2020-01-07 ENCOUNTER — Other Ambulatory Visit: Payer: Self-pay

## 2020-01-07 ENCOUNTER — Other Ambulatory Visit: Payer: Self-pay | Admitting: Family Medicine

## 2020-01-07 DIAGNOSIS — N6489 Other specified disorders of breast: Secondary | ICD-10-CM | POA: Diagnosis not present

## 2020-01-07 DIAGNOSIS — N6320 Unspecified lump in the left breast, unspecified quadrant: Secondary | ICD-10-CM

## 2020-01-07 DIAGNOSIS — R922 Inconclusive mammogram: Secondary | ICD-10-CM | POA: Diagnosis not present

## 2020-01-07 IMAGING — US US BREAST*L* LIMITED INC AXILLA
1 series · 12 of 12 positions shown · non-contrast
Comparison: Previous exam(s).

CLINICAL DATA: Patient presents for palpable abnormality within the
inferior left breast.

EXAM:
DIGITAL DIAGNOSTIC BILATERAL MAMMOGRAM WITH CAD AND TOMO
ULTRASOUND LEFT BREAST

[Series 1: us breast*left* limited inc axilla · 0.06mm/px · 12 of 12 slices shown]
[im 1/12]
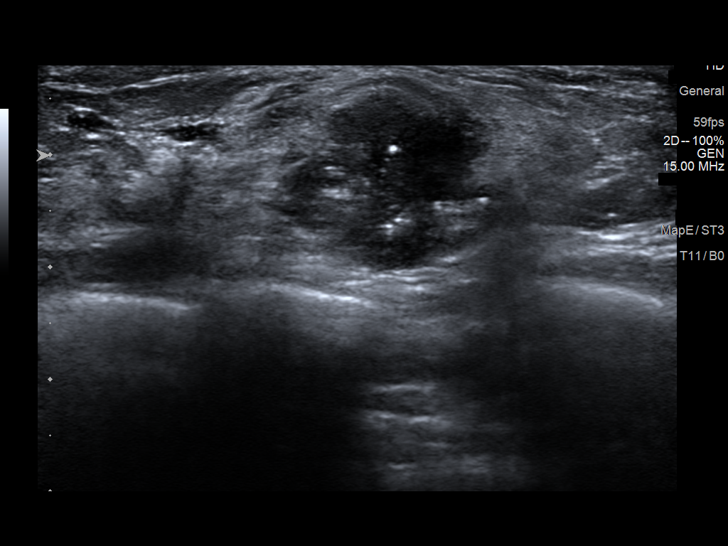
[im 2/12]
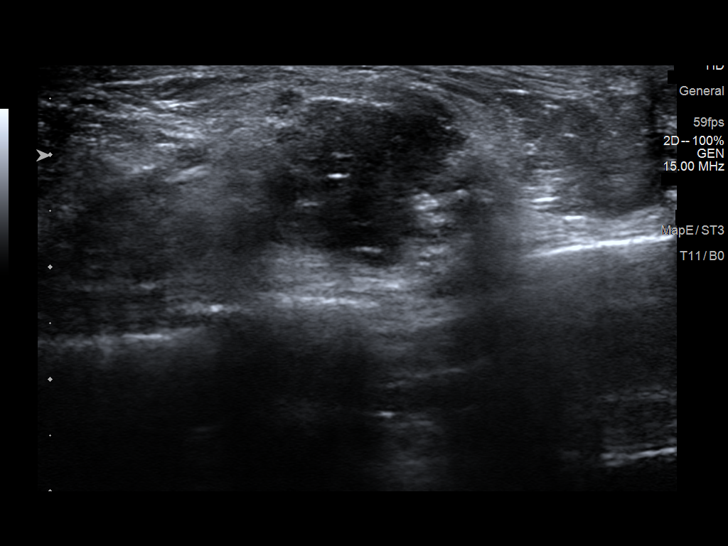
[im 3/12]
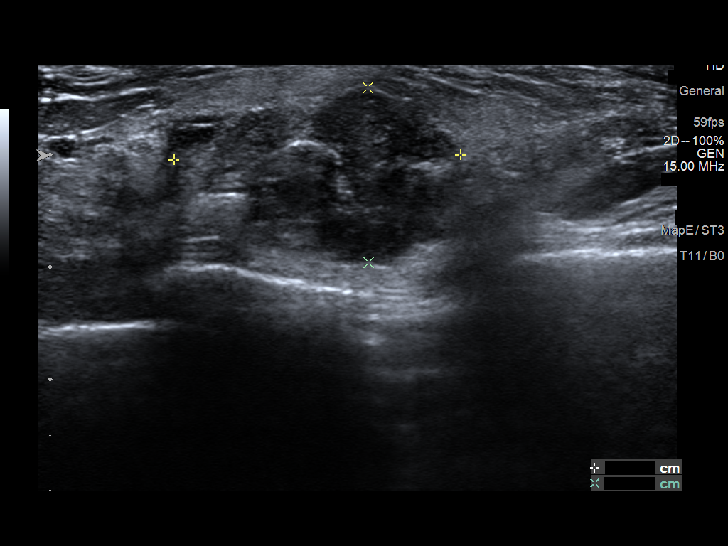
[im 4/12]
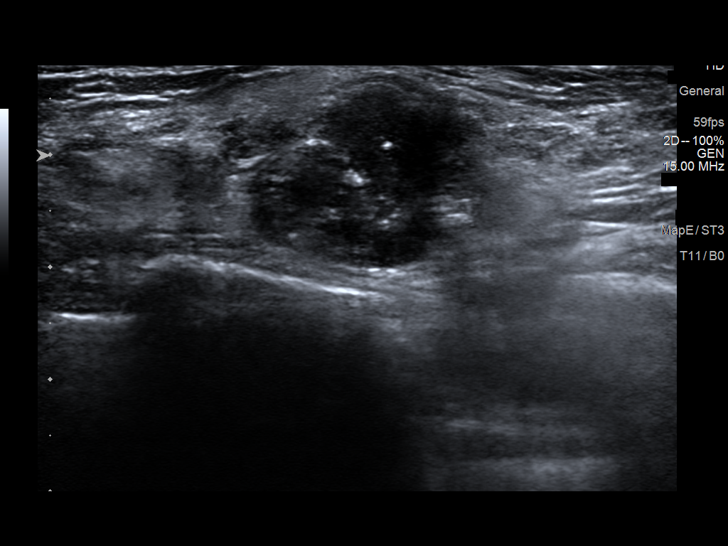
[im 5/12]
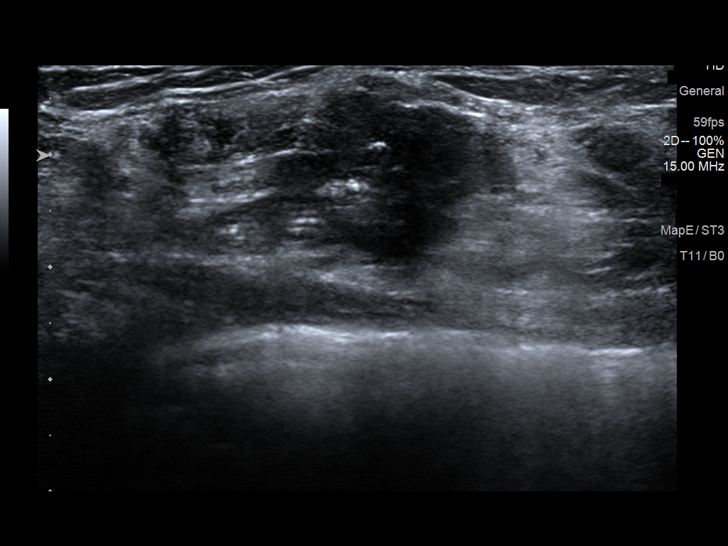
[im 6/12]
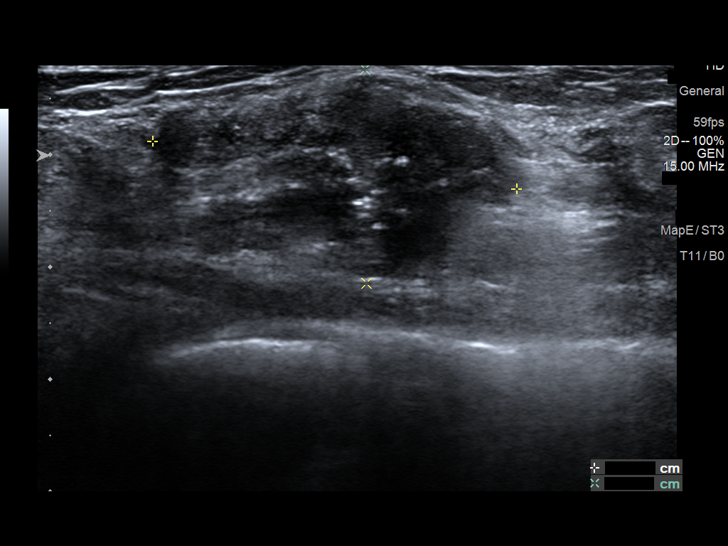
[im 7/12]
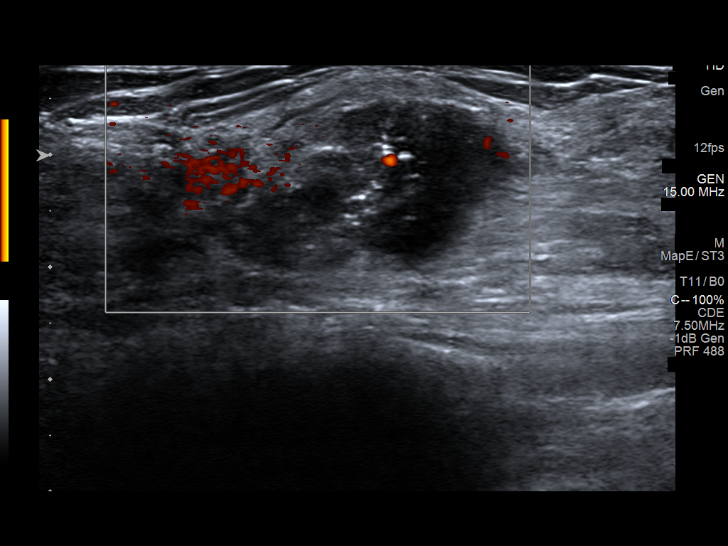
[im 8/12]
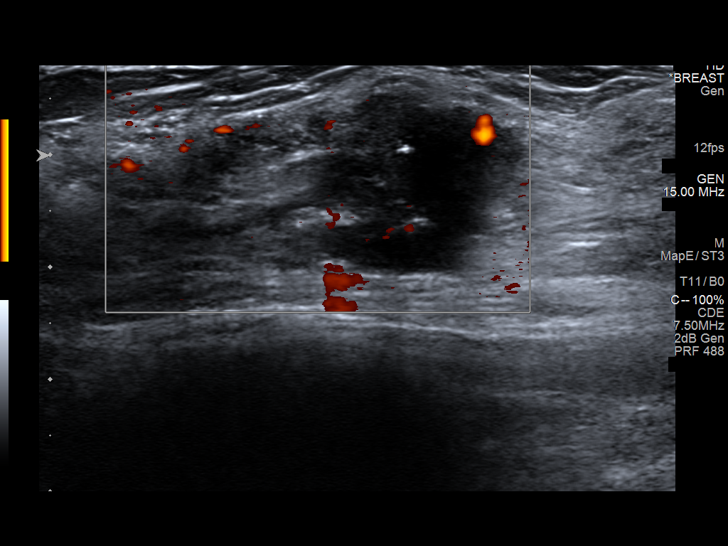
[im 9/12]
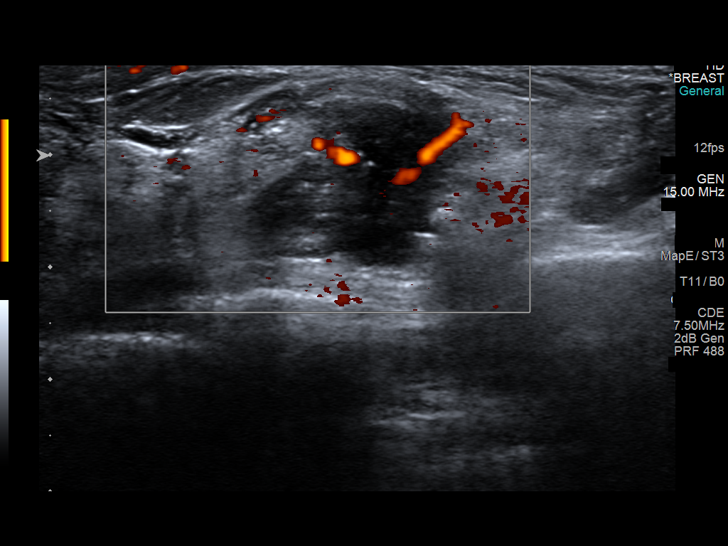
[im 10/12]
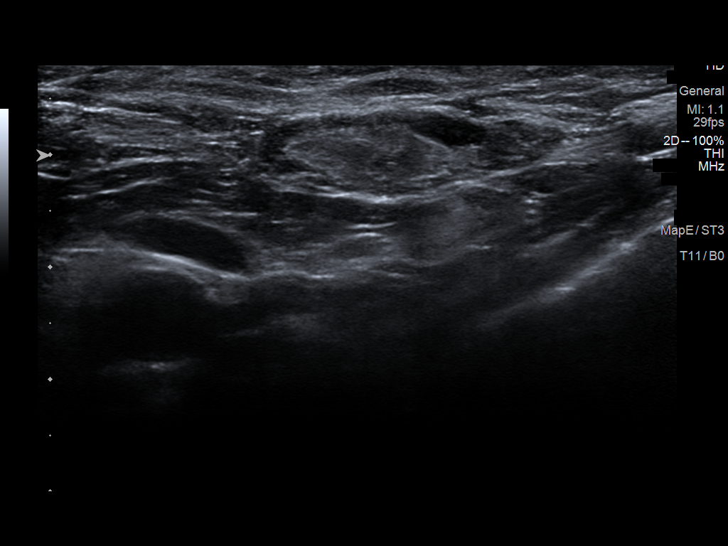
[im 11/12]
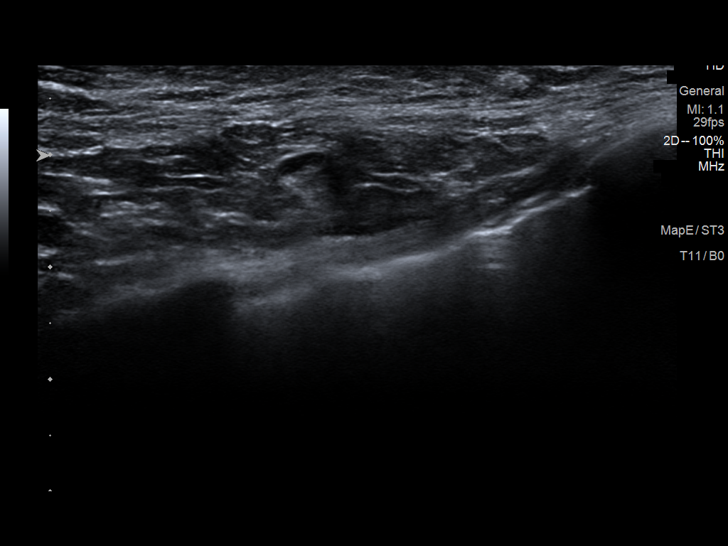
[im 12/12]
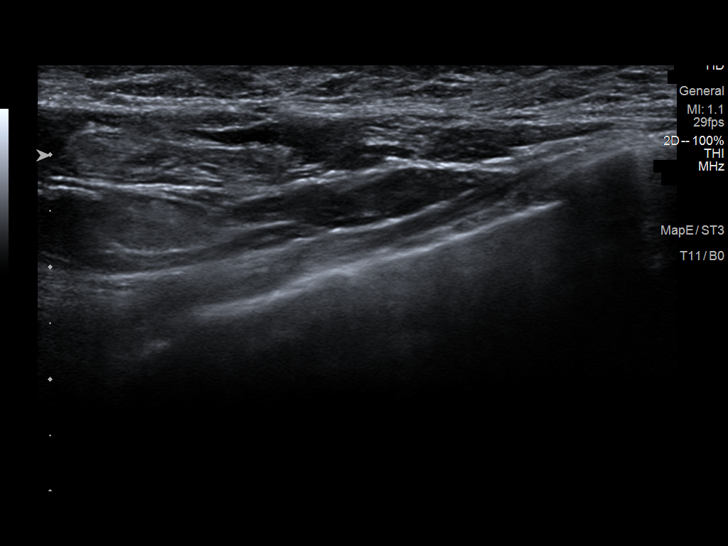

[12 of 12 positions shown; findings below may reference images not displayed]

ACR Breast Density Category c: The breast tissue is heterogeneously
dense, which may obscure small masses.
FINDINGS: Within the inferior aspect of the left breast middle depth
underlying the palpable marker is a lobular obscured mass with
associated coarse calcifications. Postsurgical changes identified
within the left and right breast.

Mammographic images were processed with CAD.

On physical exam, there is a mobile firm mass within the inferior
left breast.

Targeted ultrasound is performed, showing a 3.3 x 1.9 x 1.6 cm
lobular hypoechoic mass with internal color vascularity left breast
5 o'clock position 3 cm from nipple at the site of palpable concern.
No left axillary adenopathy.
IMPRESSION: Suspicious palpable left breast mass 5 o'clock position.

RECOMMENDATION:
Ultrasound-guided core needle biopsy palpable left breast mass.

If this demonstrates malignant process, recommend bilateral breast
MRI given the dense breast tissue bilaterally and postsurgical
changes.

I have discussed the findings and recommendations with the patient.
If applicable, a reminder letter will be sent to the patient
regarding the next appointment.

BI-RADS CATEGORY  4: Suspicious.

## 2020-01-07 IMAGING — MG DIGITAL DIAGNOSTIC BILAT W/ TOMO W/ CAD
6 of 10 series · 6 of 30 positions shown · non-contrast
Comparison: Previous exam(s).

CLINICAL DATA: Patient presents for palpable abnormality within the
inferior left breast.

EXAM:
DIGITAL DIAGNOSTIC BILATERAL MAMMOGRAM WITH CAD AND TOMO
ULTRASOUND LEFT BREAST

[L MLO synth-2D]
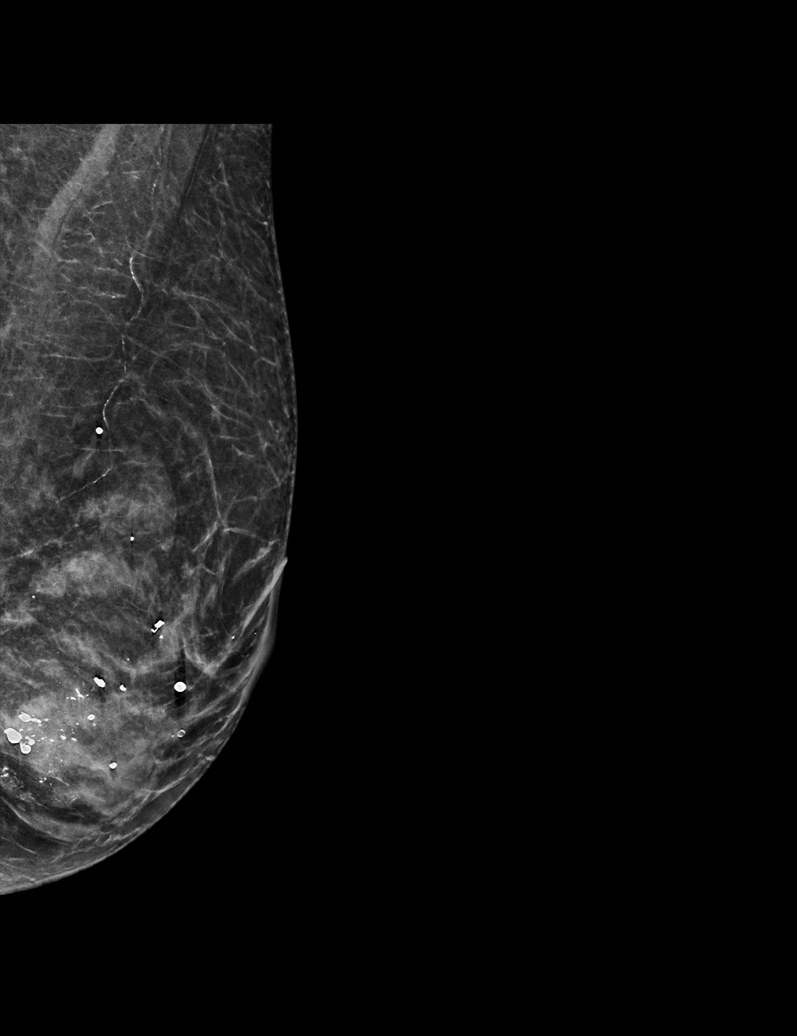

[R MLO synth-2D]
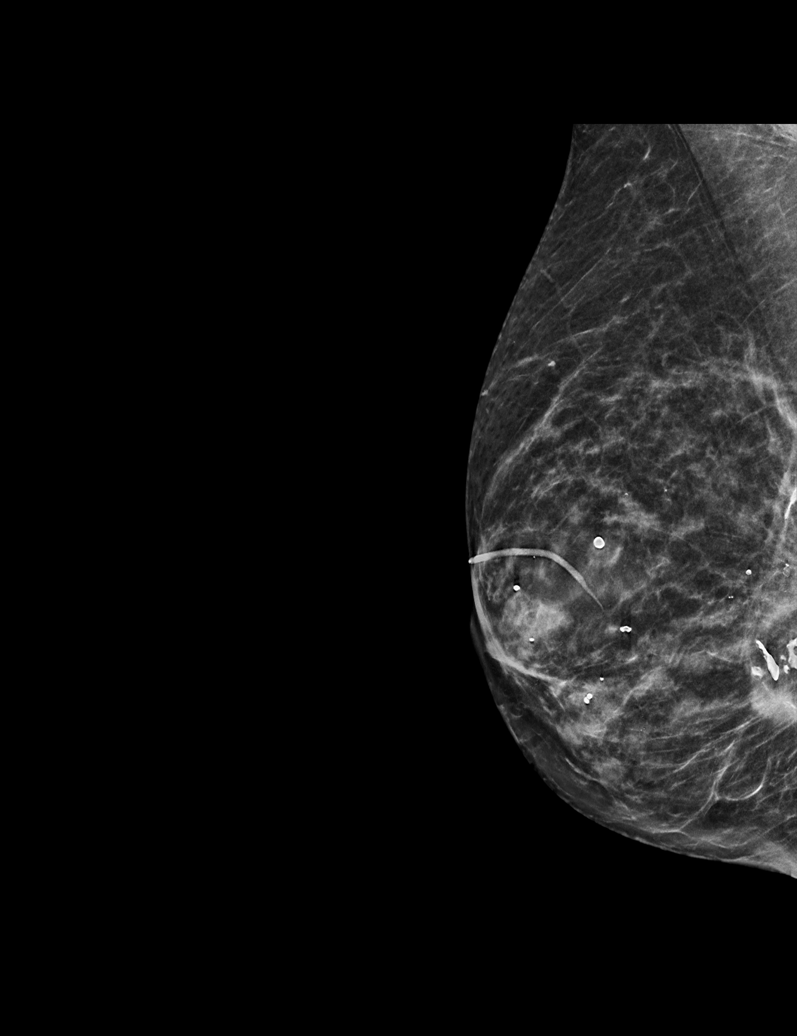

[L CC synth-2D]
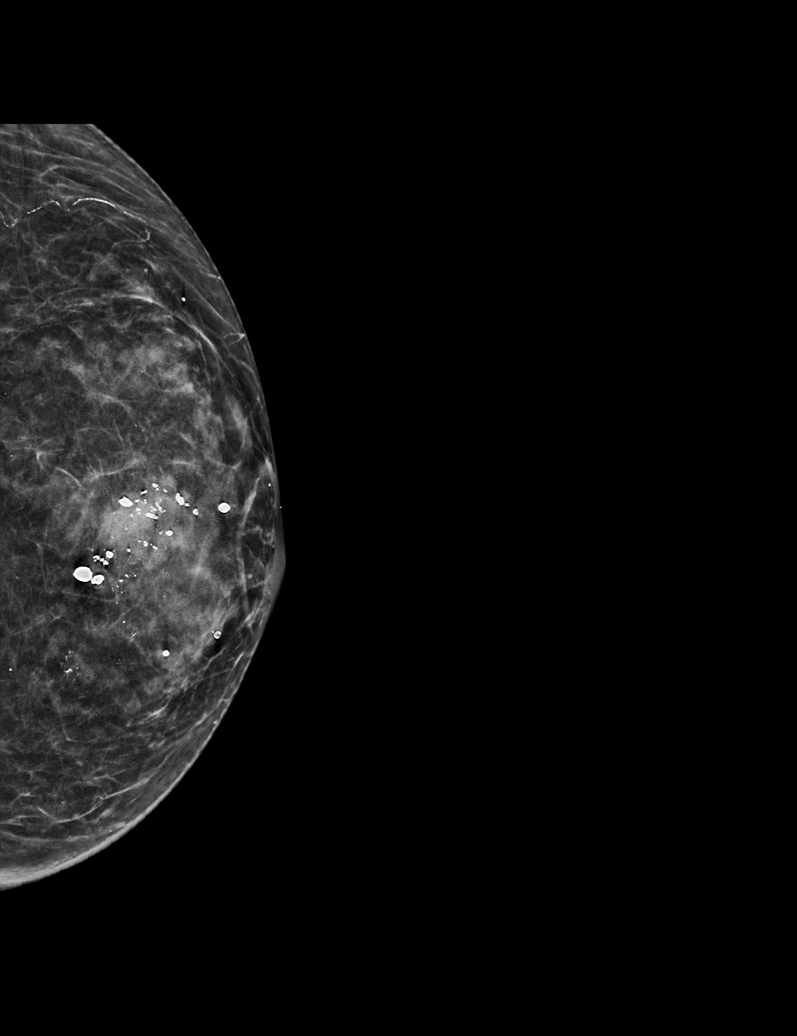

[L TAN synth-2D]
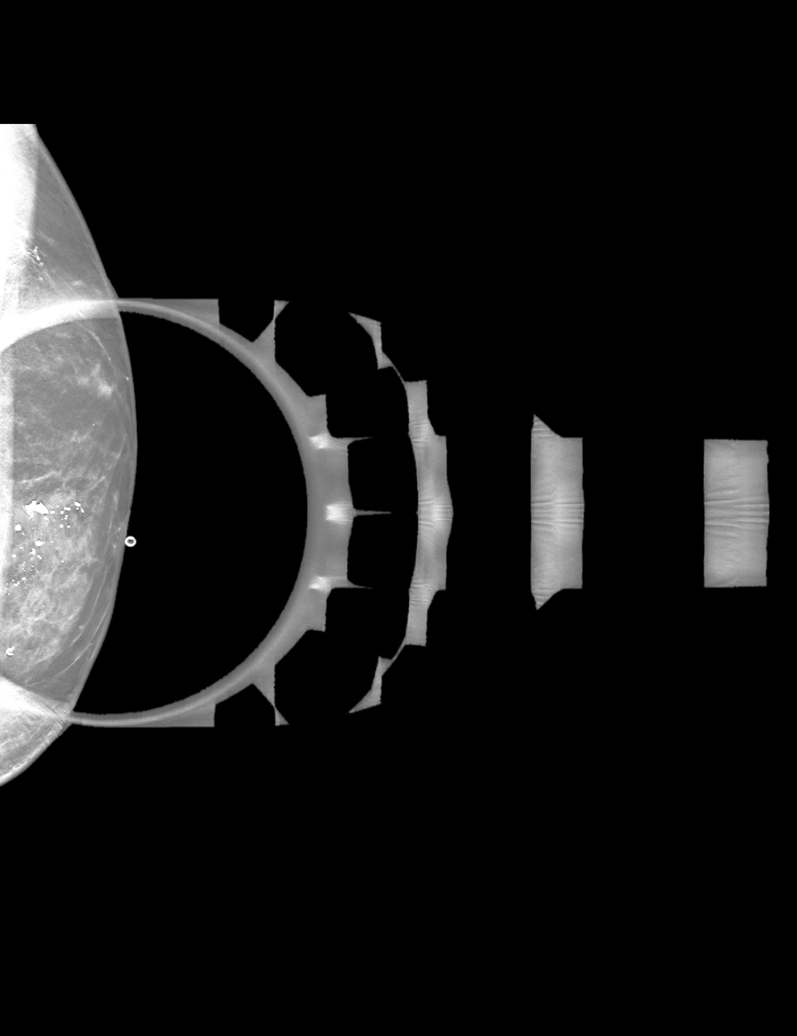

[R CC synth-2D]
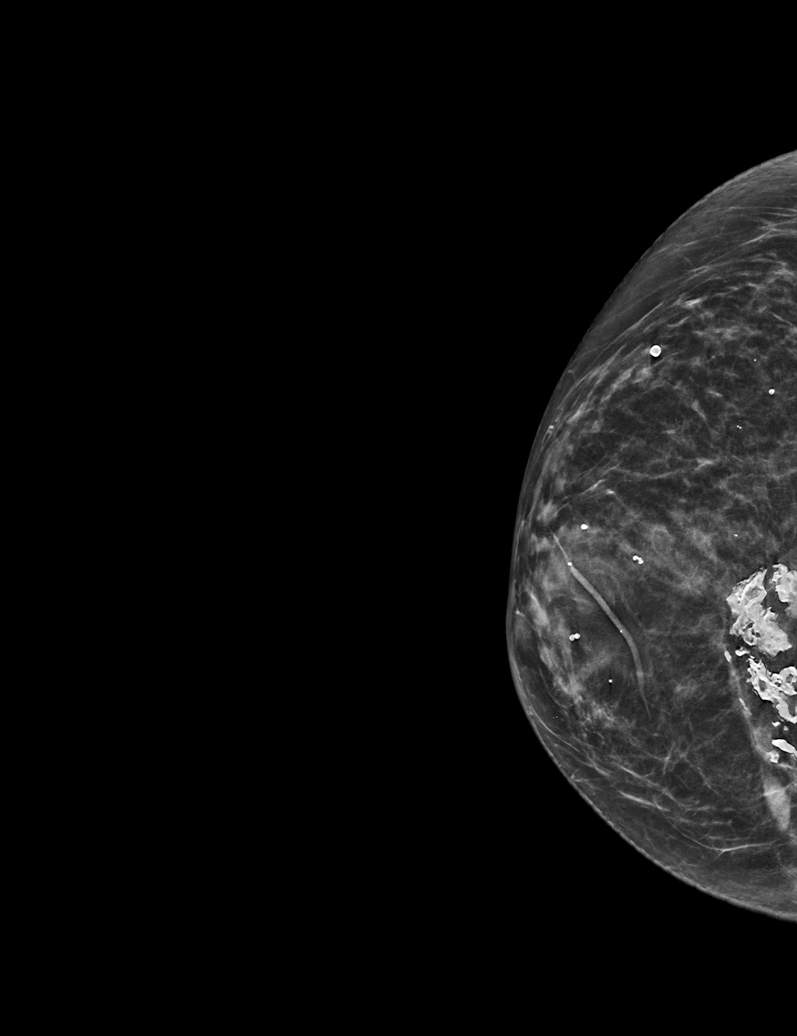

[R MLO tomo · tomo slice 27/52.0]
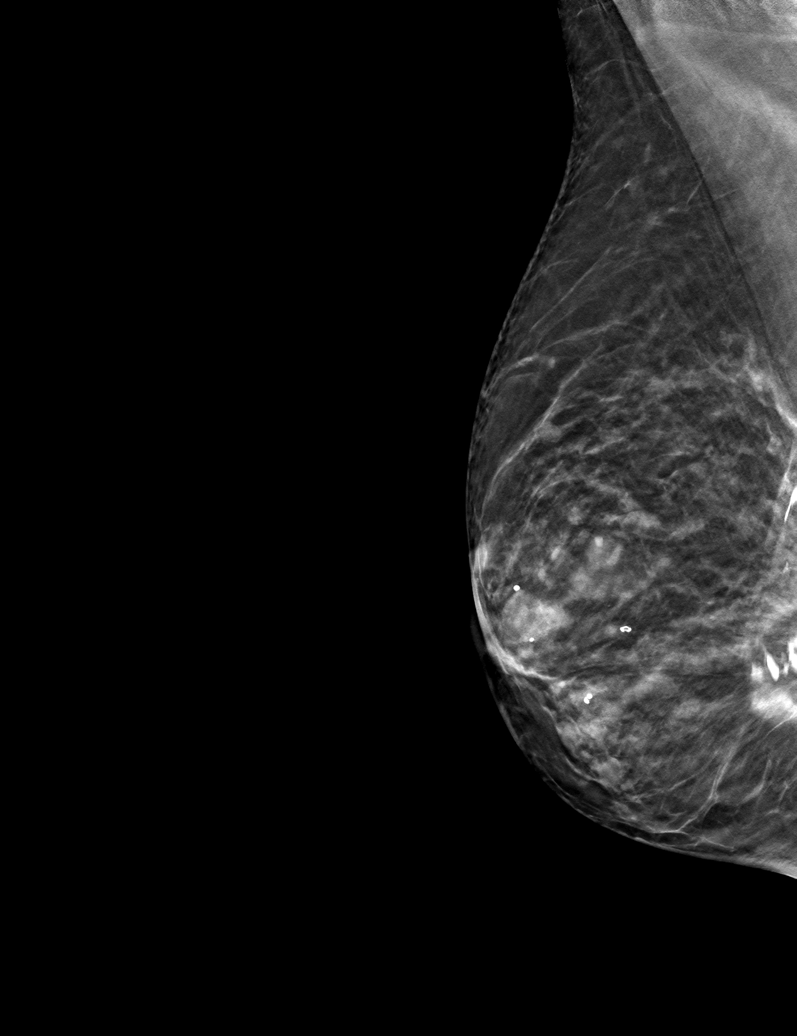

[6 of 30 positions shown; findings below may reference images not displayed]

ACR Breast Density Category c: The breast tissue is heterogeneously
dense, which may obscure small masses.
FINDINGS: Within the inferior aspect of the left breast middle depth
underlying the palpable marker is a lobular obscured mass with
associated coarse calcifications. Postsurgical changes identified
within the left and right breast.

Mammographic images were processed with CAD.

On physical exam, there is a mobile firm mass within the inferior
left breast.

Targeted ultrasound is performed, showing a 3.3 x 1.9 x 1.6 cm
lobular hypoechoic mass with internal color vascularity left breast
5 o'clock position 3 cm from nipple at the site of palpable concern.
No left axillary adenopathy.
IMPRESSION: Suspicious palpable left breast mass 5 o'clock position.

RECOMMENDATION:
Ultrasound-guided core needle biopsy palpable left breast mass.

If this demonstrates malignant process, recommend bilateral breast
MRI given the dense breast tissue bilaterally and postsurgical
changes.

I have discussed the findings and recommendations with the patient.
If applicable, a reminder letter will be sent to the patient
regarding the next appointment.

BI-RADS CATEGORY  4: Suspicious.

## 2020-01-12 DIAGNOSIS — Z0100 Encounter for examination of eyes and vision without abnormal findings: Secondary | ICD-10-CM | POA: Diagnosis not present

## 2020-01-13 NOTE — Progress Notes (Signed)
Cardiology Office Note   Date:  01/17/2020   ID:  Rachel Valentine, DOB 1941/06/03, MRN [id number]  PCP:  [name], MD  Cardiologist:  Avelina Laine  CC: Follow-up CAD, palpitations   History of Present Illness: Rachel Valentine is a 79 y.o. female who presents for ongoing assessment and management of CAD with cath on 01/04/2019, revealing normal LV function, high-grade mid AV groove circumflex on a band and high-grade diffuse calcified RCA stenosis with a fairly normal LAD. She underwent LAD intervention by Dr. Ellyn Hack with staged diamondback orbital rotational atherectomy, PCI and drug-eluting stenting of the proximal mid and distal RCA by Dr. Angelena Form on 01/06/2019.   She subsequently had an outpatient radial diagnostic coronary angiography by Dr.Berry on 06/24/2019 revealing high-grade in-stent restenosis within in the previously placed RCA stents which Dr. Gwenlyn Found was unable to cross.  Based on this she underwent CABG x1 with a vein graft to the distal RCA by Dr. Cyndia Bent    She was last seen by Dr. Gwenlyn Found on 10/19/2019 and was without recurrent chest pain. No medications were changed, no testing was completed.   Today she is feeling well.  She continues to have minimal palpitations.  She remains very active.  She walks at [employer] 3 miles every day sometimes with friends sometimes on her own.  She denies chest pain or dyspnea on exertion.  She is not having excessive bruising on clopidogrel.  She is medically compliant.  Past Medical History:  Diagnosis Date  . Bowel obstruction [facility])     Past Surgical History:  Procedure Laterality Date  . ABDOMINAL HYSTERECTOMY    . ABDOMINAL SURGERY    . APPENDECTOMY    . AUGMENTATION MAMMAPLASTY Bilateral    implants removed in 2005  . BREAST EXCISIONAL BIOPSY Left    benign  . CORONARY ARTERY BYPASS GRAFT N/A 06/28/2019   Procedure: CORONARY ARTERY BYPASS GRAFTING (CABG) using endoscopic harvest right saphenous vein to the  posterior lateral (PLB).;  Surgeon: Gaye Pollack, MD;  Location: [facility] OR;  Service: Open Heart Surgery;  Laterality: N/A;  . CORONARY ATHERECTOMY N/A 01/05/2019   Procedure: CORONARY ATHERECTOMY - STENT;  Surgeon: Burnell Blanks, MD;  Location: [place] CV LAB;  Service: Cardiovascular;  Laterality: N/A;  . CORONARY STENT INTERVENTION N/A 01/04/2019   Procedure: CORONARY STENT INTERVENTION;  Surgeon: Leonie Man, MD;  Location: [place] CV LAB;  Service: Cardiovascular;  Laterality: N/A;  . LEFT HEART CATH AND CORONARY ANGIOGRAPHY N/A 01/04/2019   Procedure: LEFT HEART CATH AND CORONARY ANGIOGRAPHY;  Surgeon: Leonie Man, MD;  Location: [place] CV LAB;  Service: Cardiovascular;  Laterality: N/A;  . LEFT HEART CATH AND CORONARY ANGIOGRAPHY N/A 06/24/2019   Procedure: LEFT HEART CATH AND CORONARY ANGIOGRAPHY;  Surgeon: [name], MD;  Location: [place] CV LAB;  Service: Cardiovascular;  Laterality: N/A;  . TEE WITHOUT CARDIOVERSION N/A 06/28/2019   Procedure: TRANSESOPHAGEAL ECHOCARDIOGRAM (TEE);  Surgeon: Gaye Pollack, MD;  Location: [facility];  Service: Open Heart Surgery;  Laterality: N/A;  . TEMPORARY PACEMAKER N/A 01/05/2019   Procedure: TEMPORARY PACEMAKER;  Surgeon: Burnell Blanks, MD;  Location: [place] [facility] LAB;  Service: Cardiovascular;  Laterality: N/A;     Current Outpatient Medications  Medication Sig Dispense Refill  . amLODipine (NORVASC) 2.5 MG tablet TAKE 1 TABLET (2.5 MG TOTAL) BY MOUTH AT BEDTIME. 90 tablet 2  . Biotin 10000 MCG TABS Take 10,000 mg by mouth daily.     Marland Kitchen  Calcium-Phosphorus-Vitamin D (CITRACAL +D3 PO) Take 1 tablet by mouth daily.     . clopidogrel (PLAVIX) 75 MG tablet Take 1 tablet (75 mg total) by mouth daily. 90 tablet 3  . diphenhydrAMINE (BENADRYL) 25 mg capsule Take 25 mg by mouth at bedtime as needed for allergies.     . metoprolol succinate (TOPROL-XL) 25 MG 24 hr tablet Take 25 mg by mouth daily.    .  nitroGLYCERIN (NITROSTAT) 0.4 MG SL tablet Place 1 tablet (0.4 mg total) under the tongue every 5 (five) minutes as needed for chest pain. 30 tablet 0  . pantoprazole (PROTONIX) 40 MG tablet TAKE 1 TABLET BY MOUTH EVERY DAY 90 tablet 2  . Propylene Glycol (SYSTANE BALANCE) 0.6 % SOLN Place 2 drops into both eyes as needed (Dry eye).    . rosuvastatin (CRESTOR) 40 MG tablet Take 40 mg by mouth daily.      No current facility-administered medications for this visit.    Allergies:   Fosamax [alendronate], Prednisone, and Penicillins    Social History:  The patient  reports that she quit smoking about 21 years ago. She has never used smokeless tobacco. She reports that she does not drink alcohol.   Family History:  The patient's family history includes Heart failure in her mother; Stroke in her brother and father.    ROS: All other systems are reviewed and negative. Unless otherwise mentioned in H&P    PHYSICAL EXAM: VS:  BP 134/66   Pulse 75   Ht 5\' 2"  (1.575 m)   Wt 111 lb 3.2 oz (50.4 kg)   SpO2 100%   BMI 20.34 kg/m  , BMI Body mass index is 20.34 kg/m. GEN: Well nourished, well developed, in no acute distress HEENT: normal Neck: no JVD, carotid bruits, or masses Cardiac: RRR; no murmurs, rubs, or gallops,no edema  Respiratory:  Clear to auscultation bilaterally, normal work of breathing GI: soft, nontender, nondistended, + BS MS: no deformity or atrophy Skin: warm and dry, no rash Neuro:  Strength and sensation are intact Psych: euthymic mood, full affect   EKG: Not completed this office visit  Recent Labs: 06/15/2019: TSH 0.947 06/29/2019: Magnesium 2.5 07/13/2019: BUN 15; Creatinine, Ser 0.70; Hemoglobin 10.3; Platelets 514; Potassium 5.0; Sodium 142    Lipid Panel    Component Value Date/Time   CHOL 104 06/27/2019 0221   TRIG 68 06/27/2019 0221   HDL 49 06/27/2019 0221   CHOLHDL 2.1 06/27/2019 0221   VLDL 14 06/27/2019 0221   LDLCALC 41 06/27/2019 0221       Wt Readings from Last 3 Encounters:  01/17/20 111 lb 3.2 oz (50.4 kg)  10/19/19 111 lb 6.4 oz (50.5 kg)  07/28/19 114 lb (51.7 kg)      Other studies Reviewed: LHC 06/24/2019  Prox RCA lesion is 99% stenosed.  Prox RCA to Mid RCA lesion is 50% stenosed.  Ost RCA to Prox RCA lesion is 70% stenosed.  Previously placed Prox Cx to Mid Cx stent (unknown type) is widely patent.  Ost Cx to Prox Cx lesion is 40% stenosed.  Mid Cx to Dist Cx lesion is 40% stenosed.  Ost LM to Mid LM lesion is 40% stenosed.   Echocardiogram 10/26/ 2020  1. The left ventricle has hyperdynamic systolic function, with an ejection fraction of >65%. The cavity size was normal. Left ventricular diastolic Doppler parameters are indeterminate. 2. The right ventricle has normal systolic function. The cavity was normal. There is no increase in  right ventricular wall thickness. 3. The mitral valve is grossly normal. There is mild mitral annular calcification present. 4. The aortic valve is tricuspid. Mild thickening of the aortic valve. Mild calcification of the aortic valve. No stenosis of the aortic valve. 5. The aortic root is normal in size and structure. 6. The interatrial septum was not assessed  CABG 06/29/2019   1. Median Sternotomy 2. Extracorporeal circulation 3. Coronary artery bypass grafting x 1   SVG to PDA  4. Endoscopic vein harvest from the rightleg  ASSESSMENT AND PLAN:  1. CAD: S/P single vessel CABG. She remains active and walks 3 miles daily.  She denies chest pain or palpitations with exertion.  But she does have occasional twinges and palpitations on rare occasions.  Mostly when she takes a decongestant with pseudoephedrine (such as Zyrtec-D).  Otherwise she has been without complaint has plenty of energy and denies any new symptoms.  She requests that we change her medication from the 12.5 mg twice daily to a long-acting dose as when she cuts it she crumbles the other  half.  I will change her metoprolol tartrate to metoprolol succinate 25 mg daily.  She will continue on clopidogrel.  We will continue to follow her.  2.  Hypertension: She remains on amlodipine along with metoprolol with good control of blood pressure.  We will not make any changes to her regimen at this time.  3.  Hyperlipidemia: Goal of LDL less than 70 with a goal of HDL greater than 40.  She will continue on rosuvastatin 40 mg daily as directed.  She will need fasting lipids and LFTs by the time we see her again if not completed by primary care.  Current medicines are reviewed at length with the patient today.  I have spent 25 minutes dedicated to the care of this patient on the date of this encounter to include pre-visit review of records, assessment, management and diagnostic testing,with shared decision making.  Labs/ tests ordered today include: None Phill Myron. West Pugh, ANP, AACC   01/17/2020 3:12 PM    [place] [address] Office [phone] Fax [phone]  Notice: This dictation was prepared with Dragon dictation along with smaller phrase technology. Any transcriptional errors that result from this process are unintentional and may not be corrected upon review.

## 2020-01-17 ENCOUNTER — Ambulatory Visit: Payer: Medicare HMO | Admitting: Adult Health

## 2020-01-17 ENCOUNTER — Encounter: Payer: Self-pay | Admitting: Adult Health

## 2020-01-17 ENCOUNTER — Other Ambulatory Visit: Payer: Self-pay

## 2020-01-17 VITALS — BP 134/66 | HR 75 | Ht 62.0 in | Wt 111.2 lb

## 2020-01-17 DIAGNOSIS — R002 Palpitations: Secondary | ICD-10-CM | POA: Diagnosis not present

## 2020-01-17 DIAGNOSIS — E7800 Pure hypercholesterolemia, unspecified: Secondary | ICD-10-CM | POA: Diagnosis not present

## 2020-01-17 DIAGNOSIS — Z951 Presence of aortocoronary bypass graft: Secondary | ICD-10-CM | POA: Diagnosis not present

## 2020-01-17 DIAGNOSIS — I10 Essential (primary) hypertension: Secondary | ICD-10-CM

## 2020-01-17 DIAGNOSIS — I43 Cardiomyopathy in diseases classified elsewhere: Secondary | ICD-10-CM

## 2020-01-17 NOTE — Patient Instructions (Addendum)
Medication Instructions:  STOP- Metoprolol Tartrate when bottle is empty START- Metoprolol Succinate 25 mg by mouth daily  *If you need a refill on your cardiac medications before your next appointment, please call your pharmacy*   Lab Work: None Ordered   Testing/Procedures: None Ordered   Follow-Up: At [facility], you and your health needs are our priority.  As part of our continuing mission to provide you with exceptional heart care, we have created designated Provider Care Teams.  These Care Teams include your primary Cardiologist (physician) and Advanced Practice Providers (APPs -  Physician Assistants and Nurse Practitioners) who all work together to provide you with the care you need, when you need it.  We recommend signing up for the patient portal called "MyChart".  Sign up information is provided on this After Visit Summary.  MyChart is used to connect with patients for Virtual Visits (Telemedicine).  Patients are able to view lab/test results, encounter notes, upcoming appointments, etc.  Non-urgent messages can be sent to your provider as well.   To learn more about what you can do with MyChart, go to [email or web address].    Your next appointment:   6 month(s)  The format for your next appointment:   In Person  Provider:   You may see Quay Burow, MD or one of the following Advanced Practice Providers on your designated Care Team:    Kerin Ransom, PA-C  [place], [place]  Coletta Memos, [place]

## 2020-01-18 ENCOUNTER — Ambulatory Visit
Admission: RE | Admit: 2020-01-18 | Discharge: 2020-01-18 | Disposition: A | Discharge status: Home / Self Care | Payer: Medicare HMO | Source: Ambulatory Visit | Attending: Family Medicine | Admitting: Family Medicine

## 2020-01-18 DIAGNOSIS — N6323 Unspecified lump in the left breast, lower outer quadrant: Secondary | ICD-10-CM | POA: Diagnosis not present

## 2020-01-18 DIAGNOSIS — N6320 Unspecified lump in the left breast, unspecified quadrant: Secondary | ICD-10-CM

## 2020-01-18 DIAGNOSIS — C50512 Malignant neoplasm of lower-outer quadrant of left female breast: Secondary | ICD-10-CM | POA: Diagnosis not present

## 2020-01-18 IMAGING — US US BREAST BX W LOC DEV 1ST LESION IMG BX SPEC US GUIDE*L*
1 series · 11 of 11 positions shown · non-contrast
Comparison: Previous exam(s).
COMPARISON: Previous exam(s).

Addendum:
CLINICAL DATA: 78-year-old female for tissue sampling of a 3.3 cm
LOWER OUTER LEFT breast mass.

EXAM:
ULTRASOUND GUIDED LEFT BREAST CORE NEEDLE BIOPSY

[Series 1: us breast bx w loc dev 1st lesion img bx spec us g · 0.05mm/px · 11 of 11 slices shown]
[im 1/11]
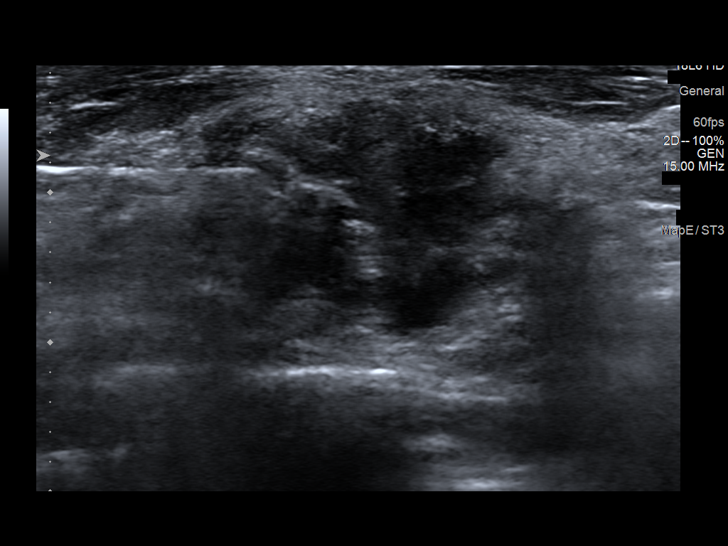
[im 2/11]
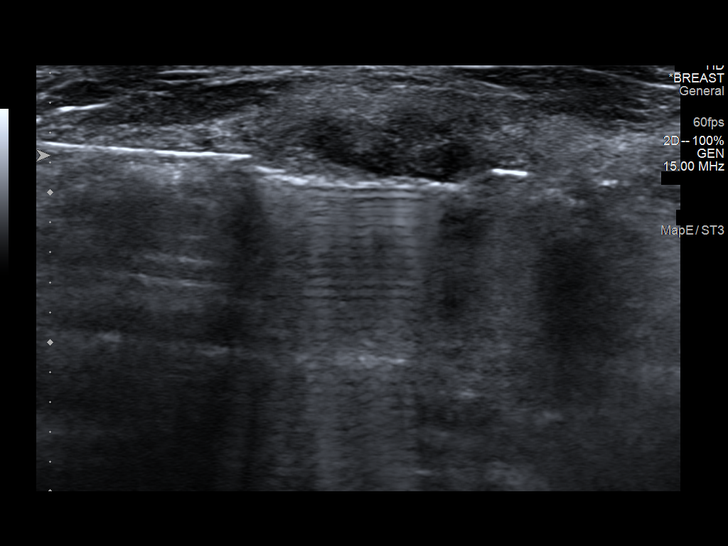
[im 3/11]
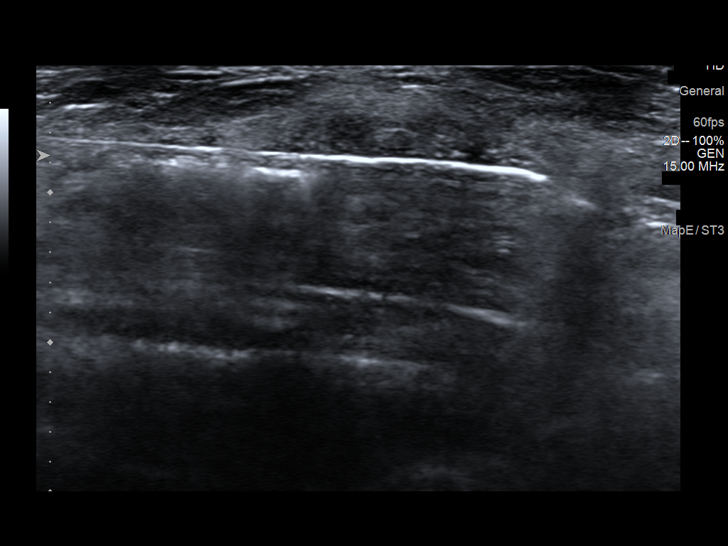
[im 4/11]
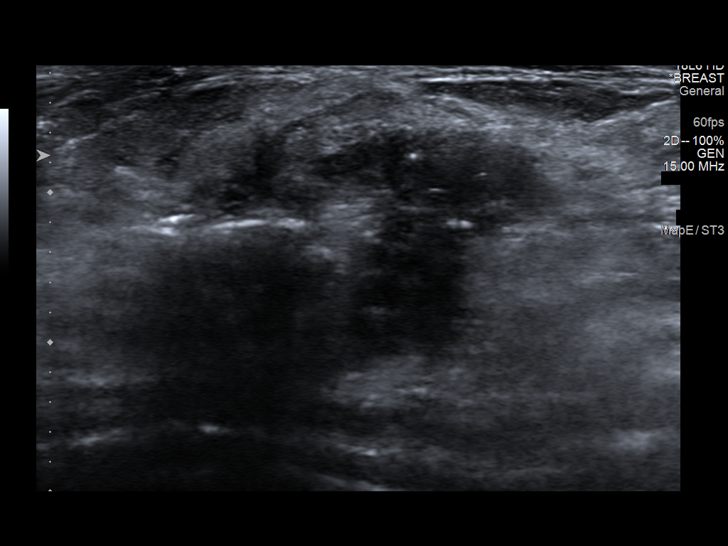
[im 5/11]
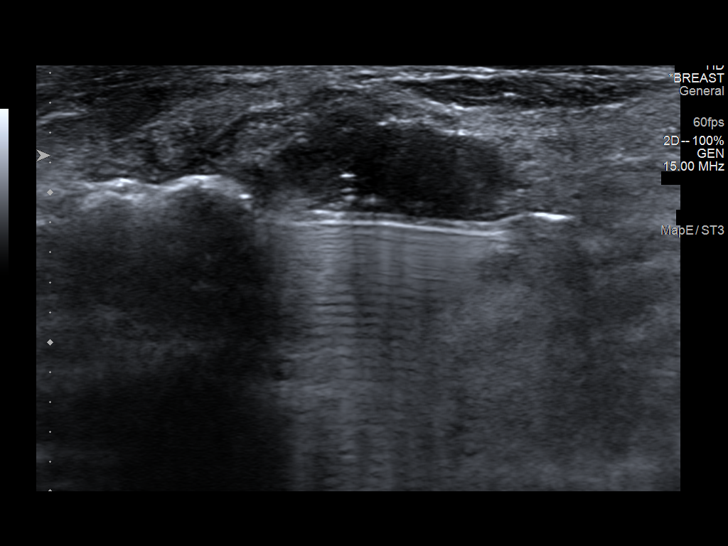
[im 6/11]
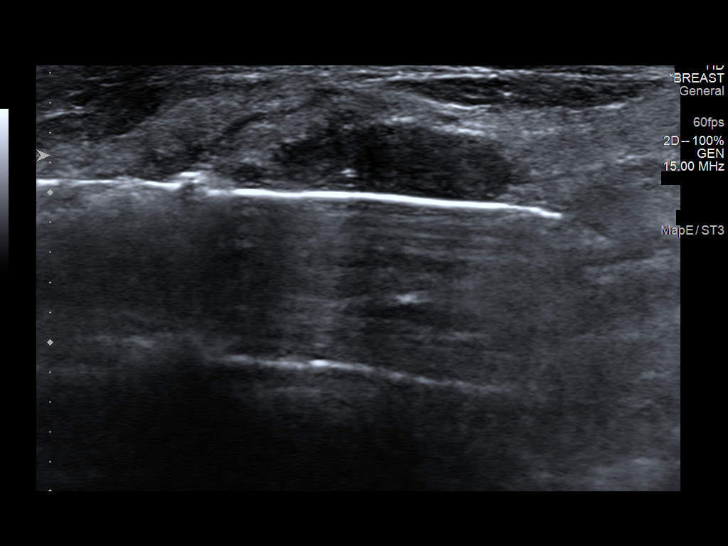
[im 7/11]
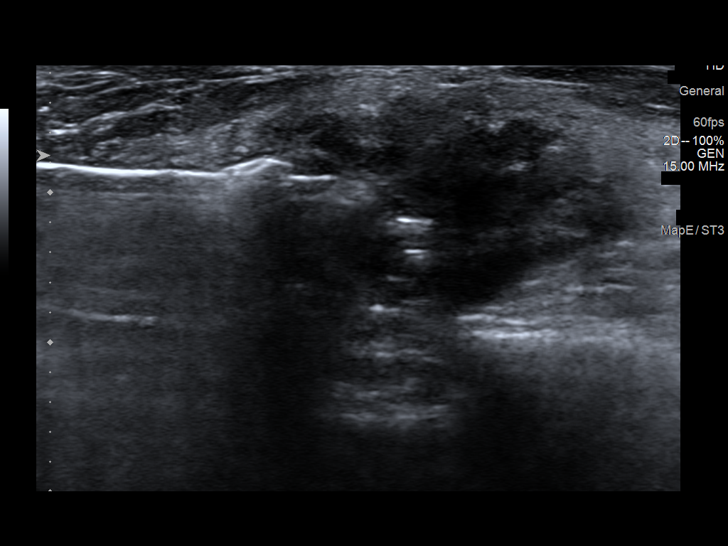
[im 8/11]
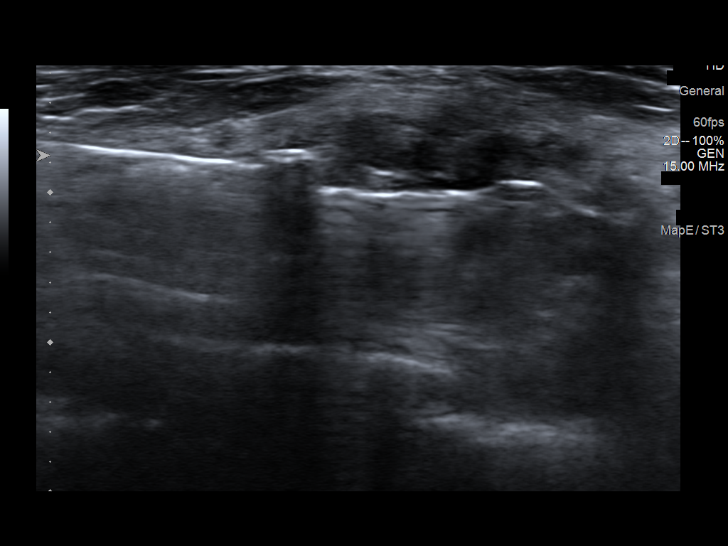
[im 9/11]
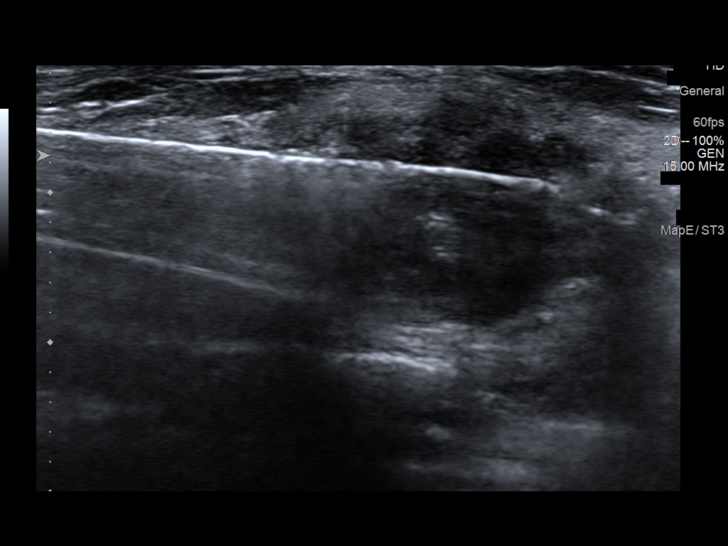
[im 10/11]
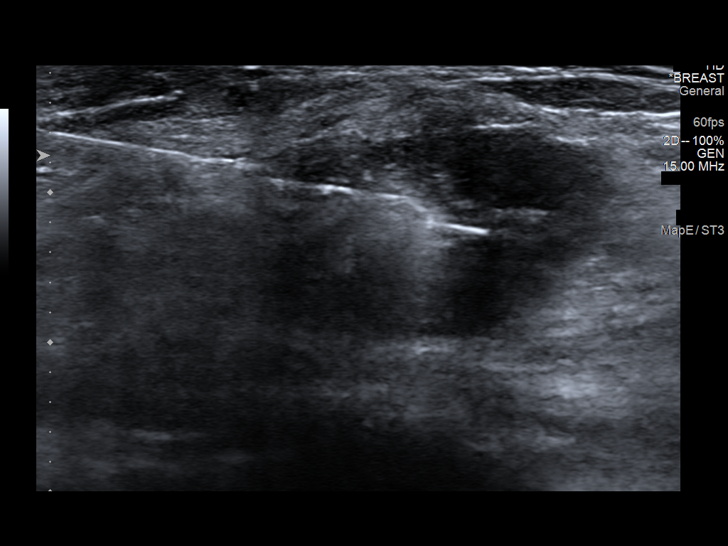
[im 11/11]
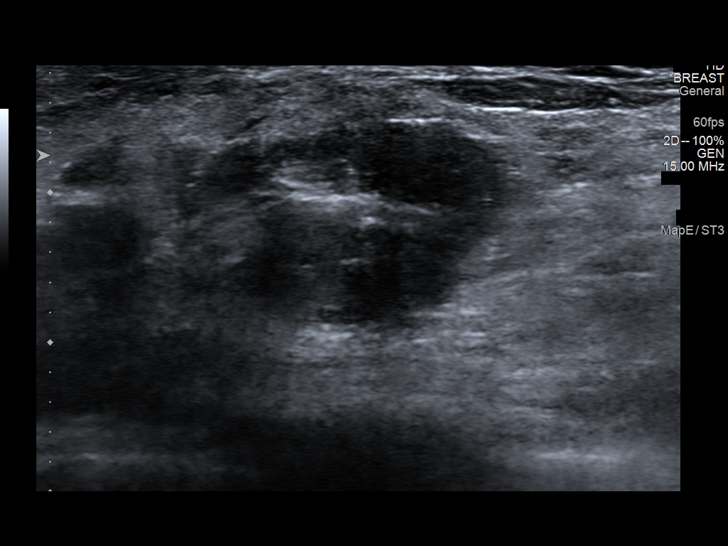

[11 of 11 positions shown; findings below may reference images not displayed]



Lesion quadrant: LOWER OUTER LEFT breast

Using sterile technique and 1% Lidocaine as local anesthetic, under
direct ultrasound visualization, a 14 gauge TIGER device was
used to perform biopsy of the 3.3 cm mass at the 5 o'clock position
of the LEFT breast using a MEDIAL approach. At the conclusion of the
procedure RIBBON tissue marker clip was deployed into the biopsy
cavity. Follow up 2 view mammogram was performed and dictated
separately.

As there was mild oozing and the patient is currently on Plavix and
aspirin, a Quik-clot pad was placed.
IMPRESSION: Ultrasound guided biopsy of 3.3 cm LOWER OUTER LEFT breast mass. No
apparent complications.

ADDENDUM:
Pathology revealed GRADE III INVASIVE DUCTAL CARCINOMA of the LEFT
breast, lower outer, 5 o'clock. This was found to be concordant by
Dr. TIGER.

Pathology results were discussed with the patient by telephone. The
patient reported doing well after the biopsy with tenderness at the
site. Post biopsy instructions and care were reviewed and questions
were answered. The patient was encouraged to call The [REDACTED]

Surgical consultation has been arranged with Dr. TIGER at
[REDACTED] on [DATE].

Breast MRI recommended for extent of disease.

Pathology results reported by TIGER RN on [DATE].



Lesion quadrant: LOWER OUTER LEFT breast

Using sterile technique and 1% Lidocaine as local anesthetic, under
direct ultrasound visualization, a 14 gauge TIGER device was
used to perform biopsy of the 3.3 cm mass at the 5 o'clock position
of the LEFT breast using a MEDIAL approach. At the conclusion of the
procedure RIBBON tissue marker clip was deployed into the biopsy
cavity. Follow up 2 view mammogram was performed and dictated
separately.

As there was mild oozing and the patient is currently on Plavix and
aspirin, a Quik-clot pad was placed.
IMPRESSION: Ultrasound guided biopsy of 3.3 cm LOWER OUTER LEFT breast mass. No
apparent complications.

## 2020-01-18 IMAGING — MG MM BREAST LOCALIZATION CLIP
4 series · 4 of 12 positions shown · non-contrast
Comparison: Previous exam(s).

CLINICAL DATA: Evaluate RIBBON clip following ultrasound-guided
LEFT breast biopsy.

EXAM:
DIAGNOSTIC RIGHT MAMMOGRAM POST ULTRASOUND BIOPSY

[L ML synth-2D]
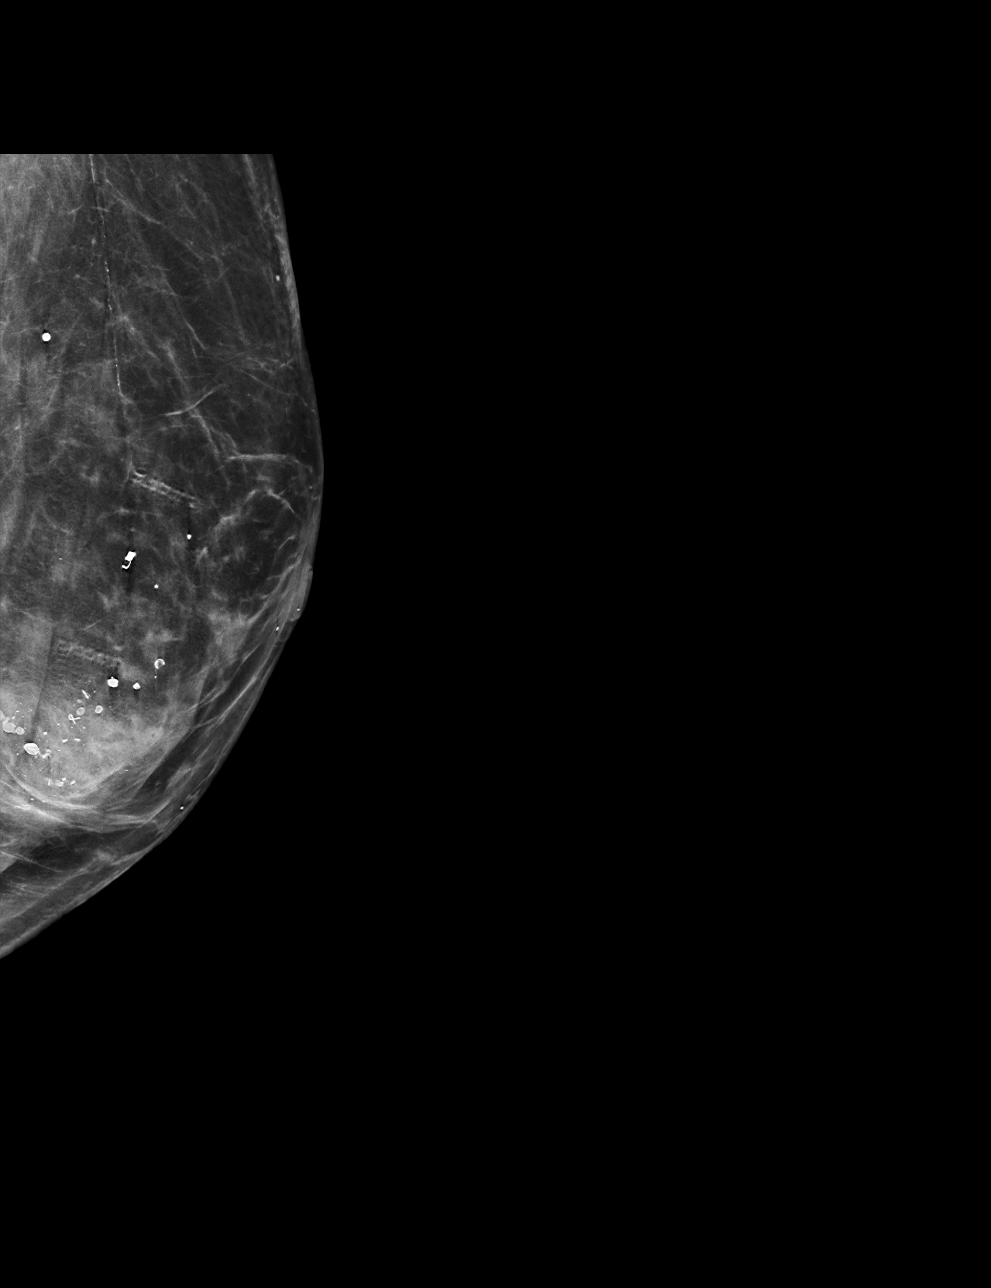

[L CC synth-2D]
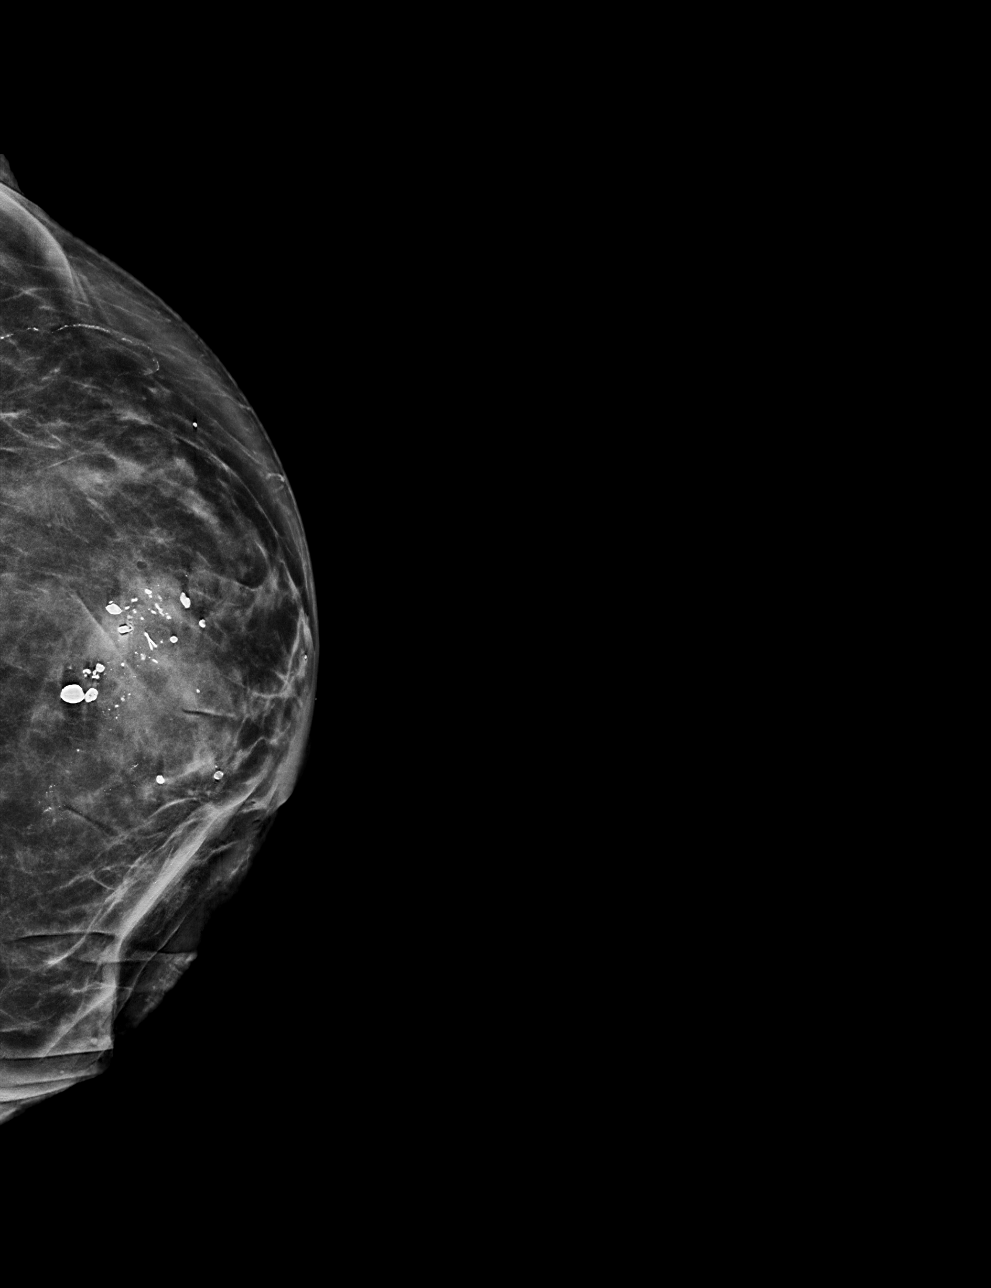

[L ML tomo · tomo slice 35/70.0]
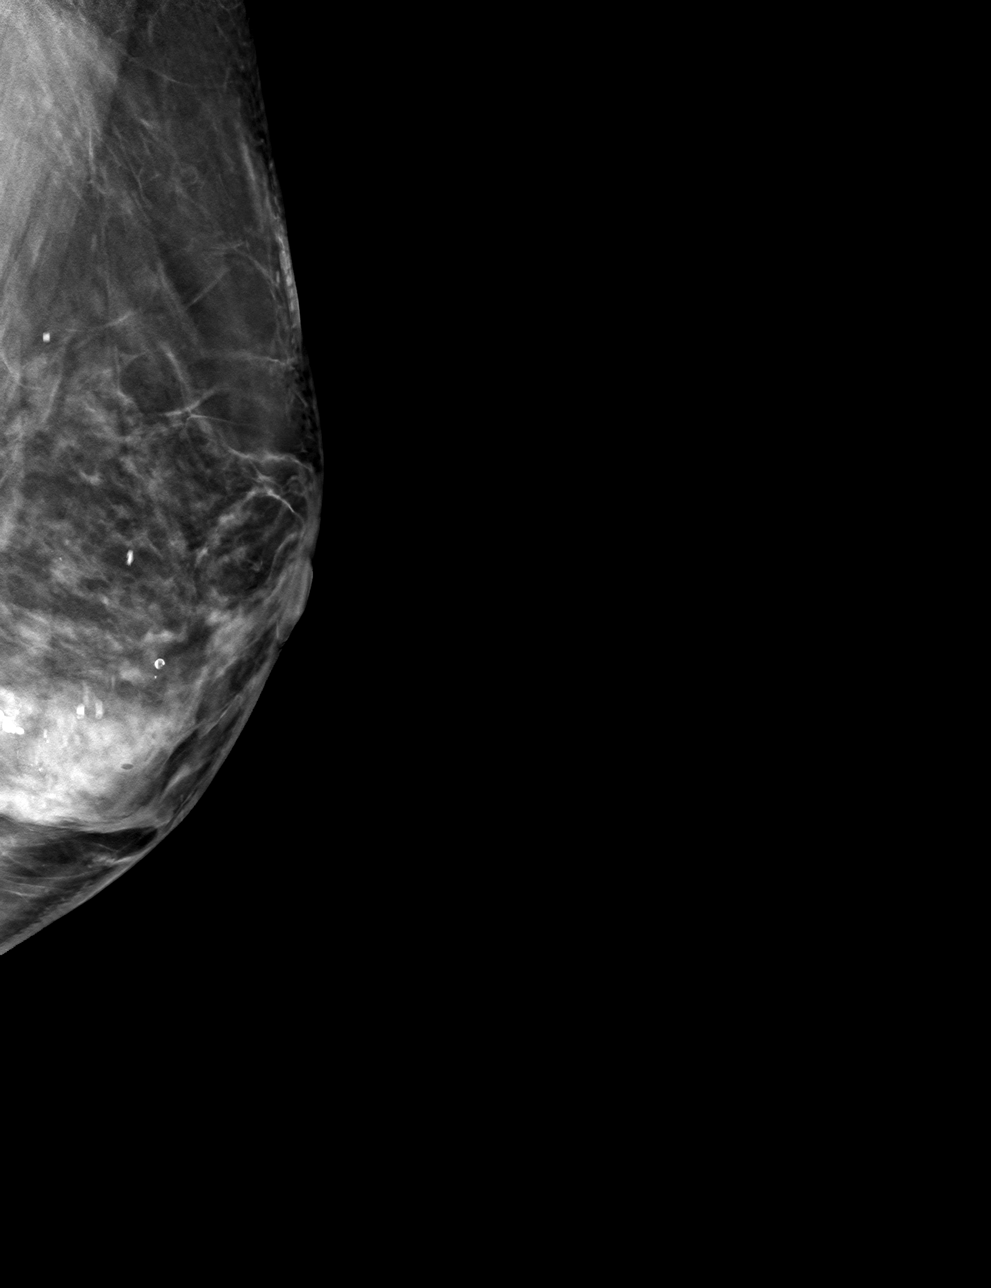

[L CC tomo · tomo slice 33/65.0]
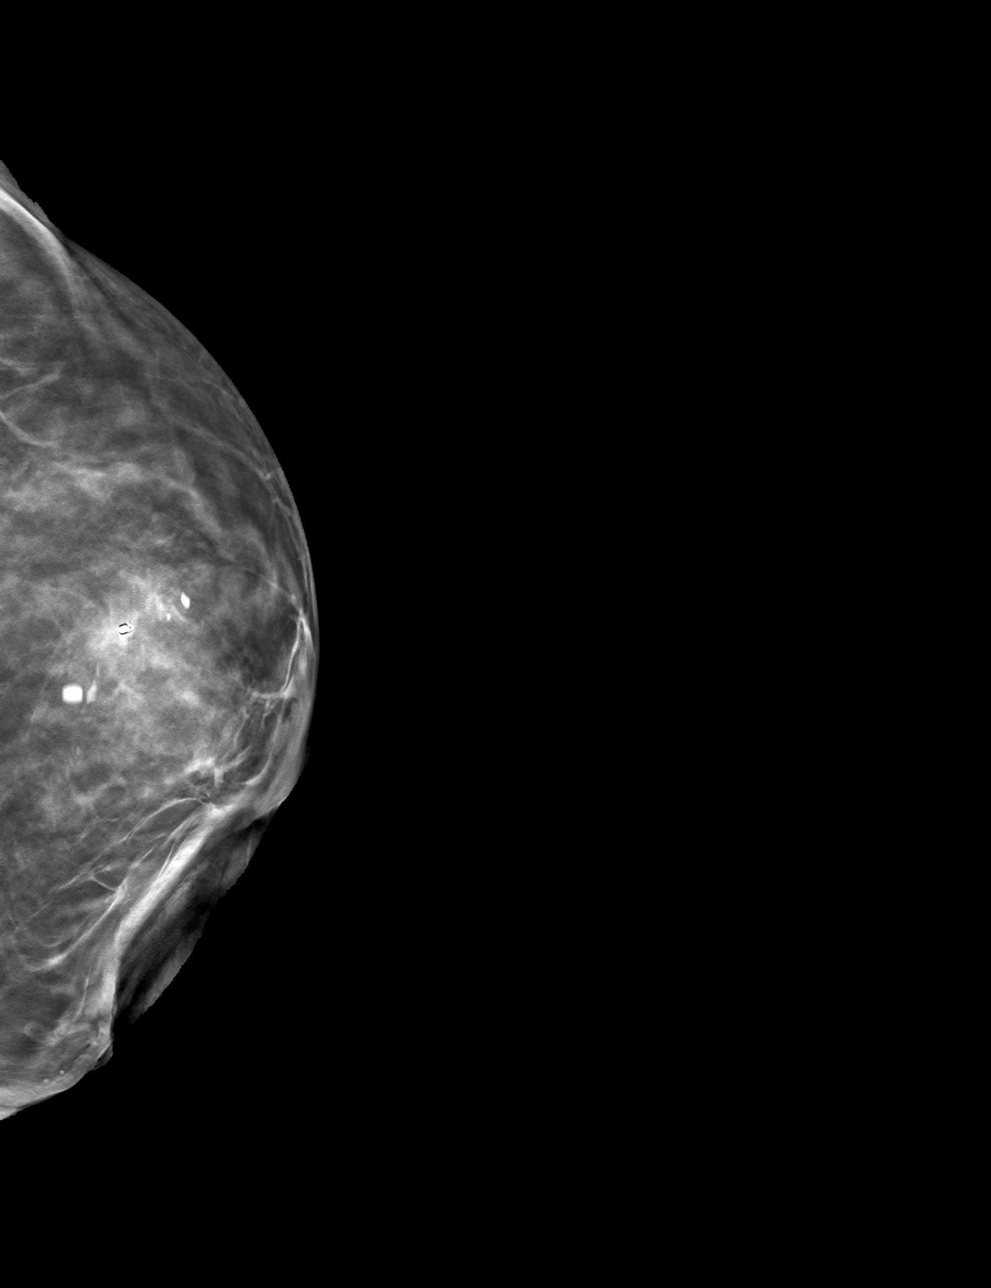

[4 of 12 positions shown; findings below may reference images not displayed]

FINDINGS: Mammographic images were obtained following ultrasound guided biopsy
of a 3.3 cm mass within the LOWER OUTER LEFT breast. The RIBBON
biopsy marking clip is in expected position at the site of biopsy.

Please note that the patient has an existing COIL biopsy clip within
the OUTER central LEFT breast from previous benign biopsy.
IMPRESSION: Appropriate positioning of the RIBBON shaped biopsy marking clip at
the site of biopsy in the LOWER OUTER LEFT breast.

Final Assessment: Post Procedure Mammograms for Marker Placement

## 2020-01-21 ENCOUNTER — Other Ambulatory Visit (HOSPITAL_COMMUNITY): Payer: Self-pay | Admitting: Surgery

## 2020-01-21 ENCOUNTER — Other Ambulatory Visit: Payer: Self-pay | Admitting: Surgery

## 2020-01-21 ENCOUNTER — Telehealth: Payer: Self-pay

## 2020-01-21 ENCOUNTER — Ambulatory Visit: Payer: Self-pay | Admitting: Surgery

## 2020-01-21 DIAGNOSIS — C50912 Malignant neoplasm of unspecified site of left female breast: Secondary | ICD-10-CM

## 2020-01-21 NOTE — H&P (Signed)
Eunice Blase Appointment: 01/21/2020 9:40 AM Location: [facility] Patient #: [id number] DOB: Apr 24, 1941 Widowed / Language: [name] / Race: White Female  History of Present Illness [name] A. Eyla Tallon MD; 01/21/2020 12:56 PM) Patient words: Patient presents at the request of the [facility] due to left breast mass 1 month. The patient noticed a mass in the left breast about a month ago. It is nontender. There is no nipple discharge. She'll last mammogram 2019 and last year had open heart surgery as well as deep and the [place] which precluded her from getting a mammogram. General evaluation with mammography and ultrasound showed a 3.1 similar centrally located mass overlapping sites. 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 Ki-67 was 50% no obvious lymphadenopathy by ultrasound. No family history of breast cancer. She does not smoke.        ADDITIONAL INFORMATION: PROGNOSTIC INDICATORS Results: IMMUNOHISTOCHEMICAL AND MORPHOMETRIC ANALYSIS PERFORMED MANUALLY The tumor cells are POSITIVE for Her2 (3+). Estrogen Receptor: 95%, POSITIVE, STRONG STAINING INTENSITY Progesterone Receptor: 0%, NEGATIVE Proliferation Marker Ki67: 50% COMMENT: The negative hormone receptor study(ies) in this case has an internal positive control. REFERENCE RANGE ESTROGEN RECEPTOR NEGATIVE 0% POSITIVE =>1% REFERENCE RANGE PROGESTERONE RECEPTOR NEGATIVE 0% POSITIVE =>1% All controls stained appropriately [name] MD Pathologist, Electronic Signature ( Signed 01/20/2020) FINAL DIAGNOSIS Diagnosis Breast, left, needle core biopsy, lower outer, 5 o'clock - INVASIVE DUCTAL CARCINOMA. - SEE COMMENT. 1 of 2 FINAL for Bobo, Shakiyah J [phone]) Microscopic Comment The carcinoma appears grade 3. The longest span of tumor is 1.2 cm. A breast prognostic profile will be  performed and the result reported separately. Dr. [name] has reviewed the case and concurs with this interpretation. The results are called to [facility] on 01/19/2020. (JBK:gt, 01/19/20) [name] MD Pathologist, Electronic Signature (Case signed 01/19/2020)          Patient presents for palpable abnormality within the inferior left breast.  EXAM: DIGITAL DIAGNOSTIC BILATERAL MAMMOGRAM WITH CAD AND TOMO  ULTRASOUND LEFT BREAST  COMPARISON: Previous exam(s).  ACR Breast Density Category c: The breast tissue is heterogeneously dense, which may obscure small masses.  FINDINGS: Within the inferior aspect of the left breast middle depth underlying the palpable marker is a lobular obscured mass with associated coarse calcifications. Postsurgical changes identified within the left and right breast.  Mammographic images were processed with CAD.  On physical exam, there is a mobile firm mass within the inferior left breast.  Targeted ultrasound is performed, showing a 3.3 x 1.9 x 1.6 cm lobular hypoechoic mass with internal color vascularity left breast 5 o'clock position 3 cm from nipple at the site of palpable concern. No left axillary adenopathy.  IMPRESSION: Suspicious palpable left breast mass 5 o'clock position.  RECOMMENDATION: Ultrasound-guided core needle biopsy palpable left breast mass.  If this demonstrates malignant process, recommend bilateral breast MRI given the dense breast tissue bilaterally and postsurgical changes.  I have discussed the findings and recommendations with the patient. If applicable, a reminder letter will be sent to the patient regarding the next appointment.  BI-RADS CATEGORY 4: Suspicious.   Electronically Signed By: [name] M.D. On: 01/07/2020 13:28.  The patient is a 79 year old female.   Past Surgical History ([name], [place]; 01/21/2020 9:53 AM) Appendectomy Breast Biopsy  Left. Coronary Artery Bypass Graft Hysterectomy (not due to cancer) - Complete Mammoplasty; Reduction Left. Tonsillectomy  Diagnostic Studies  History ([name], [place]; 01/21/2020 9:53 AM) Colonoscopy 1-5 years ago Mammogram within last year Pap Smear 1-5 years ago  Allergies ([name], [place]; 01/21/2020 9:54 AM) Penicillins predniSONE *CORTICOSTEROIDS* Allergies Reconciled  Medication History ([name], [place]; 01/21/2020 9:56 AM) amLODIPine Besylate (2.5MG Tablet, Oral) Active. Aspirin Low Dose (81MG Tablet DR, Oral) Active. Rosuvastatin Calcium (40MG Tablet, Oral) Active. Clopidogrel Bisulfate (75MG Tablet, Oral) Active. Metoprolol Tartrate (25MG Tablet, Oral) Active. Pantoprazole Sodium (40MG Tablet DR, Oral) Active. Nitroglycerin (0.4MG Tab Sublingual, Sublingual) Active. Citracal +D3 (Oral) Specific strength unknown - Active. Biotin (10000MCG Tablet, Oral) Active. Benadryl Allergy (25MG Tablet, Oral) Active. Medications Reconciled  Social History ([name], [place]; 01/21/2020 9:53 AM) No caffeine use No drug use Tobacco use Former smoker.  Family History ([name], [place]; 01/21/2020 9:53 AM) Arthritis Mother. Cerebrovascular Accident Brother, Father. Diabetes Mellitus Mother. Heart Disease Brother, Mother. Hypertension Mother. Thyroid problems Mother.  Pregnancy / Birth History ([name], [place]; 01/21/2020 9:53 AM) Age at menarche 17 years. Gravida 2 Maternal age 33-25 Para 2  Other Problems ([name], [place]; 01/21/2020 9:53 AM) Breast Cancer Hypercholesterolemia Oophorectomy Left. Other disease, cancer, significant illness     Review of Systems ([name] RMA; 01/21/2020 9:53 AM) General Not Present- Appetite Loss, Chills, Fatigue, Fever, Night Sweats, Weight Gain and Weight Loss. Skin Not Present- Change in Wart/Mole, Dryness, Hives, Jaundice, New Lesions, Non-Healing Wounds,  Rash and Ulcer. HEENT Present- Seasonal Allergies and Wears glasses/contact lenses. Not Present- Earache, Hearing Loss, Hoarseness, Nose Bleed, Oral Ulcers, Ringing in the Ears, Sinus Pain, Sore Throat, Visual Disturbances and Yellow Eyes. Respiratory Not Present- Bloody sputum, Chronic Cough, Difficulty Breathing, Snoring and Wheezing. Breast Present- Breast Mass. Not Present- Breast Pain, Nipple Discharge and Skin Changes. Cardiovascular Not Present- Chest Pain, Difficulty Breathing Lying Down, Leg Cramps, Palpitations, Rapid Heart Rate, Shortness of Breath and Swelling of Extremities. Gastrointestinal Not Present- Abdominal Pain, Bloating, Bloody Stool, Change in Bowel Habits, Chronic diarrhea, Constipation, Difficulty Swallowing, Excessive gas, Gets full quickly at meals, Hemorrhoids, Indigestion, Nausea, Rectal Pain and Vomiting. Female Genitourinary Not Present- Frequency, Nocturia, Painful Urination, Pelvic Pain and Urgency. Musculoskeletal Not Present- Back Pain, Joint Pain, Joint Stiffness, Muscle Pain, Muscle Weakness and Swelling of Extremities. Neurological Not Present- Decreased Memory, Fainting, Headaches, Numbness, Seizures, Tingling, Tremor, Trouble walking and Weakness. Psychiatric Not Present- Anxiety, Bipolar, Change in Sleep Pattern, Depression, Fearful and Frequent crying. Endocrine Not Present- Cold Intolerance, Excessive Hunger, Hair Changes, Heat Intolerance, Hot flashes and New Diabetes. Hematology Present- Blood Thinners and Easy Bruising. Not Present- Excessive bleeding, Gland problems, HIV and Persistent Infections.  Vitals ([name] RMA; 01/21/2020 9:56 AM) 01/21/2020 9:56 AM Weight: 110.2 lb Height: 62in Body Surface Area: 1.48 m Body Mass Index: 20.16 kg/m  Temp.: 98.47F  Pulse: 79 (Regular)  BP: 132/72(Sitting, Left Arm, Standard)        Physical Exam (Kellene Mccleary A. Azalynn Maxim MD; 01/21/2020 12:58 PM)  General Mental Status-Alert. General  Appearance-Consistent with stated age. Hydration-Well hydrated. Voice-Normal.  Head and Neck Head-normocephalic, atraumatic with no lesions or palpable masses. Trachea-midline. Thyroid Gland Characteristics - normal size and consistency.  Breast Note: Mobile central left breast mass measuring 3 cm right breast normal. Scars from breast reduction bilaterally  Cardiovascular Cardiovascular examination reveals -normal heart sounds, regular rate and rhythm with no murmurs and normal pedal pulses bilaterally.  Neurologic Neurologic evaluation reveals -alert and oriented x 3 with no impairment of recent or remote memory. Mental Status-Normal.  Musculoskeletal  Normal Exam - Left-Upper Extremity Strength Normal and Lower Extremity Strength Normal. Normal Exam - Right-Upper Extremity Strength Normal and Lower Extremity Strength Normal.  Lymphatic Axillary  General Axillary Region: Bilateral - Description - Normal. Tenderness - Non Tender.    Assessment & Plan (Ogle Hoeffner A. Ladawna Walgren MD; 01/21/2020 12:58 PM)  BREAST CANCER, LEFT (C50.912) Impression: Refer to medical and radiation oncology We'll likely will require a Port-A-Cath for chemotherapy and discuss neoadjuvant chemotherapy since she desires breast conservation surgery. Pt requires port placement for chemotherapy. Risk include bleeding, infection, pneumothorax, hemothorax, mediastinal injury, nerve injury , blood vessel injury, stroke, blood clots, death, migration. embolization and need for additional procedures. Pt agrees to proceed.  Patient will patient will need magnetic resonance imaging  Current Plans You are being scheduled for surgery- Our schedulers will call you.  You should hear from our office's scheduling department within 5 working days about the location, date, and time of surgery. We try to make accommodations for patient's preferences in scheduling surgery, but sometimes the OR schedule or  the surgeon's schedule prevents [place] from making those accommodations.  If you have not heard from our office [phone]) in 5 working days, call the office and ask for your surgeon's nurse.  If you have other questions about your diagnosis, plan, or surgery, call the office and ask for your surgeon's nurse.  Pt Education - [place]

## 2020-01-21 NOTE — H&P (View-Only) (Signed)
Rachel Valentine Appointment: 01/21/2020 9:40 AM Location: [facility] Patient #: [id number] DOB: Apr 24, 1941 Widowed / Language: Rachel Valentine / Race: White Female  History of Present Illness [name] [name] MD; 01/21/2020 12:56 PM) Patient words: Patient presents at the request of the [facility] due to left breast mass 1 month. The patient noticed a mass in the left breast about a month ago. It is nontender. There is no nipple discharge. She'll last mammogram 2019 and last year had open heart surgery as well as deep and the Rachel Valentine which precluded her from getting a mammogram. General evaluation with mammography and ultrasound showed a 3.1 similar centrally located mass overlapping sites. 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 Ki-67 was 50% no obvious lymphadenopathy by ultrasound. No family history of breast cancer. She does not smoke.        ADDITIONAL INFORMATION: PROGNOSTIC INDICATORS Results: IMMUNOHISTOCHEMICAL AND MORPHOMETRIC ANALYSIS PERFORMED MANUALLY The tumor cells are POSITIVE for Her2 (3+). Estrogen Receptor: 95%, POSITIVE, STRONG STAINING INTENSITY Progesterone Receptor: 0%, NEGATIVE Proliferation Marker Ki67: 50% COMMENT: The negative hormone receptor study(ies) in this case has an internal positive control. REFERENCE RANGE ESTROGEN RECEPTOR NEGATIVE 0% POSITIVE =>1% REFERENCE RANGE PROGESTERONE RECEPTOR NEGATIVE 0% POSITIVE =>1% All controls stained appropriately [name] MD Pathologist, Electronic Signature ( Signed 01/20/2020) FINAL DIAGNOSIS Diagnosis Breast, left, needle core biopsy, lower outer, 5 o'clock - INVASIVE DUCTAL CARCINOMA. - SEE COMMENT. 1 of 2 FINAL for [name], [name] [phone]) Microscopic Comment The carcinoma appears grade 3. The longest span of tumor is 1.2 cm. A breast prognostic profile will be  performed and the result reported separately. Dr. [name] has reviewed the case and concurs with this interpretation. The results are called to [facility] on 01/19/2020. (JBK:gt, 01/19/20) [name] MD Pathologist, Electronic Signature (Case signed 01/19/2020)          Patient presents for palpable abnormality within the inferior left breast.  EXAM: DIGITAL DIAGNOSTIC BILATERAL MAMMOGRAM WITH CAD AND TOMO  ULTRASOUND LEFT BREAST  COMPARISON: Previous exam(s).  ACR Breast Density Category c: The breast tissue is heterogeneously dense, which may obscure small masses.  FINDINGS: Within the inferior aspect of the left breast middle depth underlying the palpable marker is a lobular obscured mass with associated coarse calcifications. Postsurgical changes identified within the left and right breast.  Mammographic images were processed with CAD.  On physical exam, there is a mobile firm mass within the inferior left breast.  Targeted ultrasound is performed, showing a 3.3 x 1.9 x 1.6 cm lobular hypoechoic mass with internal color vascularity left breast 5 o'clock position 3 cm from nipple at the site of palpable concern. No left axillary adenopathy.  IMPRESSION: Suspicious palpable left breast mass 5 o'clock position.  RECOMMENDATION: Ultrasound-guided core needle biopsy palpable left breast mass.  If this demonstrates malignant process, recommend bilateral breast MRI given the dense breast tissue bilaterally and postsurgical changes.  I have discussed the findings and recommendations with the patient. If applicable, a reminder letter will be sent to the patient regarding the next appointment.  BI-RADS CATEGORY 4: Suspicious.   Electronically Signed By: [name] M.D. On: 01/07/2020 13:28.  The patient is a 79 year old female.   Past Surgical History (Rachel Valentine, [place]; 01/21/2020 9:53 AM) Appendectomy Breast Biopsy  Left. Coronary Artery Bypass Graft Hysterectomy (not due to cancer) - Complete Mammoplasty; Reduction Left. Tonsillectomy  Diagnostic Studies  History (Rachel Valentine, [place]; 01/21/2020 9:53 AM) Colonoscopy 1-5 years ago Mammogram within last year Pap Smear 1-5 years ago  Allergies (Rachel Valentine, [place]; 01/21/2020 9:54 AM) Penicillins predniSONE *CORTICOSTEROIDS* Allergies Reconciled  Medication History (Rachel Valentine, [place]; 01/21/2020 9:56 AM) amLODIPine Besylate (2.5MG Tablet, Oral) Active. Aspirin Low Dose (81MG Tablet DR, Oral) Active. Rosuvastatin Calcium (40MG Tablet, Oral) Active. Clopidogrel Bisulfate (75MG Tablet, Oral) Active. Metoprolol Tartrate (25MG Tablet, Oral) Active. Pantoprazole Sodium (40MG Tablet DR, Oral) Active. Nitroglycerin (0.4MG Tab Sublingual, Sublingual) Active. Citracal +D3 (Oral) Specific strength unknown - Active. Biotin (10000MCG Tablet, Oral) Active. Benadryl Allergy (25MG Tablet, Oral) Active. Medications Reconciled  Social History (Rachel Valentine, [place]; 01/21/2020 9:53 AM) No caffeine use No drug use Tobacco use Former smoker.  Family History (Rachel Valentine, [place]; 01/21/2020 9:53 AM) Arthritis Mother. Cerebrovascular Accident Brother, Father. Diabetes Mellitus Mother. Heart Disease Brother, Mother. Hypertension Mother. Thyroid problems Mother.  Pregnancy / Birth History (Rachel Valentine, [place]; 01/21/2020 9:53 AM) Age at menarche 17 years. Gravida 2 Maternal age 33-25 Para 2  Other Problems (Rachel Valentine, [place]; 01/21/2020 9:53 AM) Breast Cancer Hypercholesterolemia Oophorectomy Left. Other disease, cancer, significant illness     Review of Systems (Rachel A. Brown RMA; 01/21/2020 9:53 AM) General Not Present- Appetite Loss, Chills, Fatigue, Fever, Night Sweats, Weight Gain and Weight Loss. Skin Not Present- Change in Wart/Mole, Dryness, Hives, Jaundice, New Lesions, Non-Healing Wounds,  Rash and Ulcer. HEENT Present- Seasonal Allergies and Wears glasses/contact lenses. Not Present- Earache, Hearing Loss, Hoarseness, Nose Bleed, Oral Ulcers, Ringing in the Ears, Sinus Pain, Sore Throat, Visual Disturbances and Yellow Eyes. Respiratory Not Present- Bloody sputum, Chronic Cough, Difficulty Breathing, Snoring and Wheezing. Breast Present- Breast Mass. Not Present- Breast Pain, Nipple Discharge and Skin Changes. Cardiovascular Not Present- Chest Pain, Difficulty Breathing Lying Down, Leg Cramps, Palpitations, Rapid Heart Rate, Shortness of Breath and Swelling of Extremities. Gastrointestinal Not Present- Abdominal Pain, Bloating, Bloody Stool, Change in Bowel Habits, Chronic diarrhea, Constipation, Difficulty Swallowing, Excessive gas, Gets full quickly at meals, Hemorrhoids, Indigestion, Nausea, Rectal Pain and Vomiting. Female Genitourinary Not Present- Frequency, Nocturia, Painful Urination, Pelvic Pain and Urgency. Musculoskeletal Not Present- Back Pain, Joint Pain, Joint Stiffness, Muscle Pain, Muscle Weakness and Swelling of Extremities. Neurological Not Present- Decreased Memory, Fainting, Headaches, Numbness, Seizures, Tingling, Tremor, Trouble walking and Weakness. Psychiatric Not Present- Anxiety, Bipolar, Change in Sleep Pattern, Depression, Fearful and Frequent crying. Endocrine Not Present- Cold Intolerance, Excessive Hunger, Hair Changes, Heat Intolerance, Hot flashes and New Diabetes. Hematology Present- Blood Thinners and Easy Bruising. Not Present- Excessive bleeding, Gland problems, HIV and Persistent Infections.  Vitals (Rachel A. Brown RMA; 01/21/2020 9:56 AM) 01/21/2020 9:56 AM Weight: 110.2 lb Height: 62in Body Surface Area: 1.48 m Body Mass Index: 20.16 kg/m  Temp.: 98.47F  Pulse: 79 (Regular)  BP: 132/72(Sitting, Left Arm, Standard)        Physical Exam (Josalin Carneiro A. Ayaan Shutes MD; 01/21/2020 12:58 PM)  General Mental Status-Alert. General  Appearance-Consistent with stated age. Hydration-Well hydrated. Voice-Normal.  Head and Neck Head-normocephalic, atraumatic with no lesions or palpable masses. Trachea-midline. Thyroid Gland Characteristics - normal size and consistency.  Breast Note: Mobile central left breast mass measuring 3 cm right breast normal. Scars from breast reduction bilaterally  Cardiovascular Cardiovascular examination reveals -normal heart sounds, regular rate and rhythm with no murmurs and normal pedal pulses bilaterally.  Neurologic Neurologic evaluation reveals -alert and oriented x 3 with no impairment of recent or remote memory. Mental Status-Normal.  Musculoskeletal  Normal Exam - Left-Upper Extremity Strength Normal and Lower Extremity Strength Normal. Normal Exam - Right-Upper Extremity Strength Normal and Lower Extremity Strength Normal.  Lymphatic Axillary  General Axillary Region: Bilateral - Description - Normal. Tenderness - Non Tender.    Assessment & Plan (Koraima Albertsen A. Shashwat Cleary MD; 01/21/2020 12:58 PM)  BREAST CANCER, LEFT (C50.912) Impression: Refer to medical and radiation oncology We'll likely will require a Port-A-Cath for chemotherapy and discuss neoadjuvant chemotherapy since she desires breast conservation surgery. Pt requires port placement for chemotherapy. Risk include bleeding, infection, pneumothorax, hemothorax, mediastinal injury, nerve injury , blood vessel injury, stroke, blood clots, death, migration. embolization and need for additional procedures. Pt agrees to proceed.  Patient will patient will need magnetic resonance imaging  Current Plans You are being scheduled for surgery- Our schedulers will call you.  You should hear from our office's scheduling department within 5 working days about the location, date, and time of surgery. We try to make accommodations for patient's preferences in scheduling surgery, but sometimes the OR schedule or  the surgeon's schedule prevents [place] from making those accommodations.  If you have not heard from our office [phone]) in 5 working days, call the office and ask for your surgeon's nurse.  If you have other questions about your diagnosis, plan, or surgery, call the office and ask for your surgeon's nurse.  Pt Education - [place]

## 2020-01-21 NOTE — Telephone Encounter (Signed)
   [facility] Pre-operative Risk Assessment     Request for surgical clearance:  1. What type of surgery is being performed? Port Placement for Chemo    2. When is this surgery scheduled? TBD   3. What type of clearance is required (medical clearance vs. Pharmacy clearance to hold med vs. Both)? Both   4. Are there any medications that need to be held prior to surgery and how long? Plavix and ASA 5 days prior to surgery    5. Practice name and name of physician performing surgery? [facility]    6. What is the office phone number? [phone]   7.   What is the office fax number? [phone]  8.   Anesthesia type (None, local, MAC, general) ?  General

## 2020-01-24 ENCOUNTER — Encounter: Payer: Self-pay | Admitting: *Deleted

## 2020-01-24 ENCOUNTER — Telehealth: Payer: Self-pay | Admitting: Hematology and Oncology

## 2020-01-24 NOTE — Telephone Encounter (Signed)
OK to hold plavix for the Chemo port placement

## 2020-01-24 NOTE — Telephone Encounter (Signed)
Received a new pt referral from Dr. Brantley Stage at [facility] for breast cancer. Ms. Rachel Valentine has been cld and scheduled to see Dr. Lindi Adie on 5/26 at 1pm. Pt aware to arrive 15 minutes early.

## 2020-01-25 ENCOUNTER — Encounter: Payer: Self-pay | Admitting: *Deleted

## 2020-01-25 NOTE — Telephone Encounter (Signed)
   Primary Cardiologist: Quay Burow, MD  Chart reviewed as part of pre-operative protocol coverage. Given past medical history and time since last visit, based on ACC/AHA guidelines, Rachel Valentine would be at acceptable risk for the planned procedure without further cardiovascular testing.   She may hold her Plavix for 5 days prior to surgery and restart as soon as hemostasis is achieved.  I will route this recommendation to the requesting party via Epic fax function and remove from pre-op pool.  Please call with questions.  Jossie Ng. Cleaver NP-C    01/25/2020, 9:19 AM [address] Office [phone] Fax [phone]

## 2020-01-25 NOTE — Progress Notes (Signed)
[facility] CONSULT NOTE  Patient Care Team: [name], MD as PCP - General (Family Medicine) [name], MD as PCP - Cardiology (Cardiology) [name], MD as Surgeon (General Surgery) [name], RN as Oncology Nurse Navigator Mauro Kaufmann, RN as Oncology Nurse Navigator  CHIEF COMPLAINTS/PURPOSE OF CONSULTATION:  Newly diagnosed breast cancer  HISTORY OF PRESENTING ILLNESS:  Rachel Valentine 79 y.o. female is here because of recent diagnosis of invasive ductal carcinoma of the left breast. Patient palpated a left breast abnormality. Diagnostic mammogram and [place] on 01/07/20 showed a 3.3cm mass at the 5 o'clock position and no left axillary adenopathy. Biopsy on 01/18/20 showed invasive ductal carcinoma, grade 3, HER-2 positive (3+), ER+ 95%, PR- 0%, Ki67 50%. She presents to the clinic today for initial evaluation and discussion of treatment options.   I reviewed her records extensively and collaborated the history with the patient.  SUMMARY OF ONCOLOGIC HISTORY: Oncology History  Malignant neoplasm of lower-outer quadrant of left breast of female, estrogen receptor positive ([place])  01/18/2020 Initial Diagnosis   Palpable left breast lump with calcifications at 5 o'clock position measuring 3.3 cm.  Biopsy revealed grade 3 IDC ER 95%, PR 0%, HER-2 3+ positive, Ki-67 50%, axilla negative, T2N0   01/26/2020 Cancer Staging   Staging form: Breast, AJCC 8th Edition - Clinical stage from 01/26/2020: Stage IIA (cT2, cN0, cM0, G3, ER+, PR-, HER2+) - Signed by Nicholas Lose, MD on 01/26/2020     MEDICAL HISTORY:  Past Medical History:  Diagnosis Date  . Bowel obstruction ([place])     SURGICAL HISTORY: Past Surgical History:  Procedure Laterality Date  . ABDOMINAL HYSTERECTOMY    . ABDOMINAL SURGERY    . APPENDECTOMY    . AUGMENTATION MAMMAPLASTY Bilateral    implants removed in 2005  . BREAST EXCISIONAL BIOPSY Left    benign  . CORONARY  ARTERY BYPASS GRAFT N/A 06/28/2019   Procedure: CORONARY ARTERY BYPASS GRAFTING (CABG) using endoscopic harvest right saphenous vein to the posterior lateral (PLB).;  Surgeon: Gaye Pollack, MD;  Location: [facility];  Service: Open Heart Surgery;  Laterality: N/A;  . CORONARY ATHERECTOMY N/A 01/05/2019   Procedure: CORONARY ATHERECTOMY - STENT;  Surgeon: Burnell Blanks, MD;  Location: [facility];  Service: Cardiovascular;  Laterality: N/A;  . CORONARY STENT INTERVENTION N/A 01/04/2019   Procedure: CORONARY STENT INTERVENTION;  Surgeon: Leonie Man, MD;  Location: [facility];  Service: Cardiovascular;  Laterality: N/A;  . LEFT HEART CATH AND CORONARY ANGIOGRAPHY N/A 01/04/2019   Procedure: LEFT HEART CATH AND CORONARY ANGIOGRAPHY;  Surgeon: Leonie Man, MD;  Location: [facility];  Service: Cardiovascular;  Laterality: N/A;  . LEFT HEART CATH AND CORONARY ANGIOGRAPHY N/A 06/24/2019   Procedure: LEFT HEART CATH AND CORONARY ANGIOGRAPHY;  Surgeon: [name], MD;  Location: [facility];  Service: Cardiovascular;  Laterality: N/A;  . TEE WITHOUT CARDIOVERSION N/A 06/28/2019   Procedure: TRANSESOPHAGEAL ECHOCARDIOGRAM (TEE);  Surgeon: Gaye Pollack, MD;  Location: [facility];  Service: Open Heart Surgery;  Laterality: N/A;  . TEMPORARY PACEMAKER N/A 01/05/2019   Procedure: TEMPORARY PACEMAKER;  Surgeon: Burnell Blanks, MD;  Location: [facility];  Service: Cardiovascular;  Laterality: N/A;    SOCIAL HISTORY: Social History   Socioeconomic History  . Marital status: Widowed    Spouse name: Not on file  . Number of children: Not on file  .  Years of education: Not on file  . Highest education level: Not on file  Occupational History  . Not on file  Tobacco Use  . Smoking status: Former Smoker    Quit date: 01/06/1999    Years since quitting: 21.0  . Smokeless tobacco: Never Used  Substance and Sexual Activity  . Alcohol use: No  .  Drug use: Not on file  . Sexual activity: Not on file  Other Topics Concern  . Not on file  Social History Narrative  . Not on file   Social Determinants of Health   Financial Resource Strain:   . Difficulty of Paying Living Expenses:   Food Insecurity:   . Worried About Charity fundraiser in the Last Year:   . Arboriculturist in the Last Year:   Transportation Needs:   . Film/video editor (Medical):   Marland Kitchen Lack of Transportation (Non-Medical):   Physical Activity:   . Days of Exercise per Week:   . Minutes of Exercise per Session:   Stress:   . Feeling of Stress :   Social Connections:   . Frequency of Communication with Friends and Family:   . Frequency of Social Gatherings with Friends and Family:   . Attends Religious Services:   . Active Member of Clubs or Organizations:   . Attends Archivist Meetings:   Marland Kitchen Marital Status:   Intimate Partner Violence:   . Fear of Current or Ex-Partner:   . Emotionally Abused:   Marland Kitchen Physically Abused:   . Sexually Abused:     FAMILY HISTORY: Family History  Problem Relation Age of Onset  . Heart failure Mother   . Stroke Father   . Stroke Brother     ALLERGIES:  is allergic to fosamax [alendronate]; prednisone; and penicillins.  MEDICATIONS:  Current Outpatient Medications  Medication Sig Dispense Refill  . amLODipine (NORVASC) 2.5 MG tablet TAKE 1 TABLET (2.5 MG TOTAL) BY MOUTH AT BEDTIME. 90 tablet 2  . Biotin 10000 MCG TABS Take 10,000 mg by mouth daily.     . Calcium-Phosphorus-Vitamin D (CITRACAL +D3 PO) Take 1 tablet by mouth daily.     . clopidogrel (PLAVIX) 75 MG tablet Take 1 tablet (75 mg total) by mouth daily. 90 tablet 3  . diphenhydrAMINE (BENADRYL) 25 mg capsule Take 25 mg by mouth at bedtime as needed for allergies.     . metoprolol succinate (TOPROL-XL) 25 MG 24 hr tablet Take 25 mg by mouth daily.    . nitroGLYCERIN (NITROSTAT) 0.4 MG SL tablet Place 1 tablet (0.4 mg total) under the tongue every  5 (five) minutes as needed for chest pain. 30 tablet 0  . pantoprazole (PROTONIX) 40 MG tablet TAKE 1 TABLET BY MOUTH EVERY DAY 90 tablet 2  . Propylene Glycol (SYSTANE BALANCE) 0.6 % SOLN Place 2 drops into both eyes as needed (Dry eye).    . rosuvastatin (CRESTOR) 40 MG tablet Take 40 mg by mouth daily.      No current facility-administered medications for this visit.    REVIEW OF SYSTEMS:   Constitutional: Denies fevers, chills or abnormal night sweats Eyes: Denies blurriness of vision, double vision or watery eyes Ears, nose, mouth, throat, and face: Denies mucositis or sore throat Respiratory: Denies cough, dyspnea or wheezes Cardiovascular: Denies palpitation, chest discomfort or lower extremity swelling Gastrointestinal:  Denies nausea, heartburn or change in bowel habits Skin: Denies abnormal skin rashes Lymphatics: Denies new lymphadenopathy or easy bruising Neurological:Denies  numbness, tingling or new weaknesses Behavioral/Psych: Mood is stable, no new changes  Breast: Palpable left breast mass All other systems were reviewed with the patient and are negative.  PHYSICAL EXAMINATION: ECOG PERFORMANCE STATUS: 1 - Symptomatic but completely ambulatory  There were no vitals filed for this visit. There were no vitals filed for this visit.  GENERAL:alert, no distress and comfortable SKIN: skin color, texture, turgor are normal, no rashes or significant lesions EYES: normal, conjunctiva are pink and non-injected, sclera clear OROPHARYNX:no exudate, no erythema and lips, buccal mucosa, and tongue normal  NECK: supple, thyroid normal size, non-tender, without nodularity LYMPH:  no palpable lymphadenopathy in the cervical, axillary or inguinal LUNGS: clear to auscultation and percussion with normal breathing effort HEART: regular rate & rhythm and no murmurs and no lower extremity edema ABDOMEN:abdomen soft, non-tender and normal bowel sounds Musculoskeletal:no cyanosis of  digits and no clubbing  PSYCH: alert & oriented x 3 with fluent speech NEURO: no focal motor/sensory deficits    LABORATORY DATA:  I have reviewed the data as listed Lab Results  Component Value Date   WBC 8.5 07/13/2019   HGB 10.3 (L) 07/13/2019   HCT 31.4 (L) 07/13/2019   MCV 91 07/13/2019   PLT 514 (H) 07/13/2019   Lab Results  Component Value Date   NA 142 07/13/2019   K 5.0 07/13/2019   CL 104 07/13/2019   CO2 24 07/13/2019    RADIOGRAPHIC STUDIES: I have personally reviewed the radiological reports and agreed with the findings in the report.  ASSESSMENT AND PLAN:  Malignant neoplasm of lower-outer quadrant of left breast of female, estrogen receptor positive ([place]) 01/18/2020:Palpable left breast lump with calcifications at 5 o'clock position measuring 3.3 cm.  Biopsy revealed grade 3 IDC ER 95%, PR 0%, HER-2 3+ positive, Ki-67 50%, axilla negative, T2N0 stage IIa clinical stage Bilateral implants removed 2005  Pathology and radiology counseling: Discussed with the patient, the details of pathology including the type of breast cancer,the clinical staging, the significance of ER, PR and HER-2/neu receptors and the implications for treatment. After reviewing the pathology in detail, we proceeded to discuss the different treatment options between surgery, radiation, chemotherapy, antiestrogen therapies.  Treatment plan: 1.  Neo- adjuvant chemotherapy with Taxol Herceptin  2.  Breast conserving surgery with sentinel lymph node biopsy 3.  Adjuvant radiation therapy followed by 4.  Adjuvant antiestrogen therapy with anastrozole 1 mg daily x5 years --------------------------------------------------------------------------------------------------------------------------------------------------------------- Chemo counseling: I reviewed the risks and benefits of Taxol and Herceptin with the patient in great detail. Echocardiogram, chemo class will be scheduled Breast MRI has  already been scheduled. Port has been scheduled for 15 June. Start chemotherapy on June 16. I will see her with cycle 2 of Taxol Herceptin.   All questions were answered. The patient knows to call the clinic with any problems, questions or concerns.   Rulon Eisenmenger, MD, MPH 01/26/2020    I, Molly Dorshimer, am acting as scribe for Nicholas Lose, MD.  I have reviewed the above documentation for accuracy and completeness, and I agree with the above.

## 2020-01-26 ENCOUNTER — Telehealth: Payer: Self-pay | Admitting: *Deleted

## 2020-01-26 ENCOUNTER — Encounter: Payer: Self-pay | Admitting: *Deleted

## 2020-01-26 ENCOUNTER — Inpatient Hospital Stay: Payer: Medicare HMO | Attending: Hematology and Oncology | Admitting: Hematology and Oncology

## 2020-01-26 ENCOUNTER — Other Ambulatory Visit: Payer: Self-pay

## 2020-01-26 VITALS — BP 135/59 | HR 79 | Temp 99.2°F | Resp 18 | Ht 62.0 in | Wt 109.7 lb

## 2020-01-26 DIAGNOSIS — Z170 Estrogen receptor positive status [ER+]: Secondary | ICD-10-CM | POA: Insufficient documentation

## 2020-01-26 DIAGNOSIS — Z90710 Acquired absence of both cervix and uterus: Secondary | ICD-10-CM | POA: Diagnosis not present

## 2020-01-26 DIAGNOSIS — Z87891 Personal history of nicotine dependence: Secondary | ICD-10-CM | POA: Diagnosis not present

## 2020-01-26 DIAGNOSIS — Z8249 Family history of ischemic heart disease and other diseases of the circulatory system: Secondary | ICD-10-CM | POA: Diagnosis not present

## 2020-01-26 DIAGNOSIS — Z79899 Other long term (current) drug therapy: Secondary | ICD-10-CM | POA: Diagnosis not present

## 2020-01-26 DIAGNOSIS — C50512 Malignant neoplasm of lower-outer quadrant of left female breast: Secondary | ICD-10-CM | POA: Insufficient documentation

## 2020-01-26 MED ORDER — PROCHLORPERAZINE MALEATE 10 MG PO TABS: 10.0000 mg | ORAL_TABLET | Freq: Four times a day (QID) | ORAL | 1 refills | Status: DC | PRN

## 2020-01-26 MED ORDER — LORAZEPAM 0.5 MG PO TABS: 0.5000 mg | ORAL_TABLET | Freq: Four times a day (QID) | ORAL | 0 refills | Status: DC | PRN

## 2020-01-26 MED ORDER — ONDANSETRON HCL 8 MG PO TABS: 8.0000 mg | ORAL_TABLET | Freq: Two times a day (BID) | ORAL | 1 refills | Status: DC | PRN

## 2020-01-26 MED ORDER — LIDOCAINE-PRILOCAINE 2.5-2.5 % EX CREA: TOPICAL_CREAM | CUTANEOUS | 3 refills | Status: DC

## 2020-01-26 NOTE — Telephone Encounter (Signed)
Called pt to speak with her about day and time to schedule echocardiogram. Pt did not answer, LVM to return call to [phone].

## 2020-01-26 NOTE — Telephone Encounter (Signed)
Pt returned call for echocardiogram per Dr Lindi Adie. Pt has been scheduled for echo 02/02/20 at 11am; verbalized understanding.

## 2020-01-26 NOTE — Progress Notes (Signed)
START OFF PATHWAY REGIMEN - Breast   Custom Intervention:Medical: [Paclitaxel, Trastuzumab and hyaluronidase-oysk]:     Paclitaxel      Trastuzumab and hyaluronidase-oysk   **Always confirm dose/schedule in your pharmacy ordering system**  Patient Characteristics: Preoperative or Nonsurgical Candidate (Clinical Staging), Neoadjuvant Therapy followed by Surgery, Invasive Disease, Chemotherapy, HER2 Positive, ER Positive Therapeutic Status: Preoperative or Nonsurgical Candidate (Clinical Staging) AJCC M Category: cM0 AJCC Grade: G3 Breast Surgical Plan: Neoadjuvant Therapy followed by Surgery ER Status: Positive (+) AJCC 8 Stage Grouping: IIA HER2 Status: Positive (+) AJCC T Category: cT2 AJCC N Category: cN0 PR Status: Negative (-) Intent of Therapy: Curative Intent, Discussed with Patient

## 2020-01-26 NOTE — Progress Notes (Signed)
Location of Breast Cancer: Left Breast Invasive ductal Carcinoma- Lower outer quadrant  Did patient present with symptoms (if so, please note symptoms) or was this found on screening mammography?: Patient palpated a left breast abnormality.  Diagnostic mammogram and [place] 01/07/2020: Showed a 3.3 cm mass at the 5 o'clock position and no left axillary adenopathy.  Histology per Pathology Report: Left Breast 01/18/2020  Receptor Status: ER(+ 95%), PR (- 0%), Her2-neu (+), Ki-67(50%)    Past/Anticipated interventions by surgeon, if any:  Past/Anticipated interventions by medical oncology, if any: Chemotherapy  Dr. Lindi Adie 01/26/2020 Treatment plan: 1.  Neo- adjuvant chemotherapy with Taxol Herceptin  2.  Breast conserving surgery with sentinel lymph node biopsy 3.  Adjuvant radiation therapy followed by 4.  Adjuvant antiestrogen therapy with anastrozole 1 mg daily x5 years Chemo counseling: I reviewed the risks and benefits of Taxol and Herceptin with the patient in great detail. Echocardiogram, chemo class will be scheduled Breast MRI has already been scheduled. Port has been scheduled for 15 June. Start chemotherapy on June 16. I will see her with cycle 2 of Taxol Herceptin.   Lymphedema issues, if any:  No  Pain issues, if any:  0  SAFETY ISSUES:  Prior radiation? No  Pacemaker/ICD? No  Possible current pregnancy? Hysterectomy  Is the patient on methotrexate? No  Current Complaints / other details:   Port insertion 02/15/2020    Cori Razor, RN 01/26/2020,2:09 PM

## 2020-01-26 NOTE — Assessment & Plan Note (Signed)
01/18/2020:Palpable left breast lump with calcifications at 5 o'clock position measuring 3.3 cm.  Biopsy revealed grade 3 IDC ER 95%, PR 0%, HER-2 3+ positive, Ki-67 50%, axilla negative, T2N0 stage IIa clinical stage Bilateral implants removed 2005  Pathology and radiology counseling: Discussed with the patient, the details of pathology including the type of breast cancer,the clinical staging, the significance of ER, PR and HER-2/neu receptors and the implications for treatment. After reviewing the pathology in detail, we proceeded to discuss the different treatment options between surgery, radiation, chemotherapy, antiestrogen therapies.  Treatment plan: 1.  Breast conserving surgery with sentinel lymph node biopsy 2.  Adjuvant chemotherapy with Taxol Herceptin versus Herceptin alone based on performance status 3.  Adjuvant radiation therapy followed by 4.  Adjuvant antiestrogen therapy --------------------------------------------------------------------------------------------------------------------------------------------------------------- Chemo counseling: I reviewed the risks and benefits of Taxol and Herceptin with the patient in great detail.  Return to clinic after surgery to discuss pathology report and starting adjuvant therapy 

## 2020-01-27 ENCOUNTER — Other Ambulatory Visit: Payer: Self-pay

## 2020-01-27 ENCOUNTER — Ambulatory Visit
Admission: RE | Admit: 2020-01-27 | Discharge: 2020-01-27 | Disposition: A | Discharge status: Home / Self Care | Payer: Medicare HMO | Source: Ambulatory Visit | Attending: Radiation Oncology | Admitting: Radiation Oncology

## 2020-01-27 ENCOUNTER — Telehealth: Payer: Self-pay | Admitting: *Deleted

## 2020-01-27 ENCOUNTER — Telehealth: Payer: Self-pay | Admitting: Hematology and Oncology

## 2020-01-27 ENCOUNTER — Encounter: Payer: Self-pay | Admitting: Radiation Oncology

## 2020-01-27 VITALS — Ht 62.0 in | Wt 109.0 lb

## 2020-01-27 DIAGNOSIS — Z170 Estrogen receptor positive status [ER+]: Secondary | ICD-10-CM

## 2020-01-27 DIAGNOSIS — C50512 Malignant neoplasm of lower-outer quadrant of left female breast: Secondary | ICD-10-CM | POA: Diagnosis not present

## 2020-01-27 NOTE — Progress Notes (Signed)
Radiation Oncology         [phone] ________________________________  Initial Outpatient Consultation - Conducted via telephone due to current COVID-19 concerns for limiting patient exposure  I spoke with the patient to conduct this consult visit via telephone to spare the patient unnecessary potential exposure in the healthcare setting during the current COVID-19 pandemic. The patient was notified in advance and was offered a Nina meeting to allow for face to face communication but unfortunately reported that they did not have the appropriate resources/technology to support such a visit and instead preferred to proceed with a telephone consult.   Name: Rachel Valentine        MRN: [id number]  Date of Service: 01/27/2020 DOB: 04-Sep-1940  [id number], [name], MD  [name], MD     REFERRING PHYSICIAN: [name], MD   DIAGNOSIS: The encounter diagnosis was Malignant neoplasm of lower-outer quadrant of left breast of female, estrogen receptor positive ([place]).   HISTORY OF PRESENT ILLNESS: Rachel Valentine is a 79 y.o. female seen in the multidisciplinary breast clinic for a new diagnosis of left breast cancer. The patient was noted to have a palpable mass in the left breast, which prompted further diagnostic imaging, she underwent these images and calcifications of a mass at 5:00 in the left breast was noted measuring 3.3 cm.  She underwent biopsy on 01/18/2020 which revealed an invasive ductal carcinoma that was grade 3, the tumor was ER positive, PR negative and HER-2 amplified.  Her Ki-67 was 50%.  She met with Dr. Brantley Stage and with Dr. Lindi Adie.  Recommendations have been to proceed with neoadjuvant chemotherapy with Taxol and Herceptin followed by breast conservation surgery sentinel lymph node biopsy, and she is contacted today by telephone to discuss options of adjuvant therapy.    PREVIOUS RADIATION THERAPY: No   PAST MEDICAL HISTORY:  Past Medical History:    Diagnosis Date  . Bowel obstruction ([place])        PAST SURGICAL HISTORY: Past Surgical History:  Procedure Laterality Date  . ABDOMINAL HYSTERECTOMY    . ABDOMINAL SURGERY    . APPENDECTOMY    . AUGMENTATION MAMMAPLASTY Bilateral    implants removed in 2005  . BREAST EXCISIONAL BIOPSY Left    benign  . CORONARY ARTERY BYPASS GRAFT N/A 06/28/2019   Procedure: CORONARY ARTERY BYPASS GRAFTING (CABG) using endoscopic harvest right saphenous vein to the posterior lateral (PLB).;  Surgeon: Gaye Pollack, MD;  Location: [facility];  Service: Open Heart Surgery;  Laterality: N/A;  . CORONARY ATHERECTOMY N/A 01/05/2019   Procedure: CORONARY ATHERECTOMY - STENT;  Surgeon: Burnell Blanks, MD;  Location: [facility];  Service: Cardiovascular;  Laterality: N/A;  . CORONARY STENT INTERVENTION N/A 01/04/2019   Procedure: CORONARY STENT INTERVENTION;  Surgeon: Leonie Man, MD;  Location: [facility];  Service: Cardiovascular;  Laterality: N/A;  . LEFT HEART CATH AND CORONARY ANGIOGRAPHY N/A 01/04/2019   Procedure: LEFT HEART CATH AND CORONARY ANGIOGRAPHY;  Surgeon: Leonie Man, MD;  Location: [facility];  Service: Cardiovascular;  Laterality: N/A;  . LEFT HEART CATH AND CORONARY ANGIOGRAPHY N/A 06/24/2019   Procedure: LEFT HEART CATH AND CORONARY ANGIOGRAPHY;  Surgeon: [name], MD;  Location: [facility];  Service: Cardiovascular;  Laterality: N/A;  . TEE WITHOUT CARDIOVERSION N/A 06/28/2019   Procedure: TRANSESOPHAGEAL ECHOCARDIOGRAM (TEE);  Surgeon: Gaye Pollack, MD;  Location: [facility];  Service: Open Heart Surgery;  Laterality: N/A;  .  TEMPORARY PACEMAKER N/A 01/05/2019   Procedure: TEMPORARY PACEMAKER;  Surgeon: Burnell Blanks, MD;  Location: [facility];  Service: Cardiovascular;  Laterality: N/A;     FAMILY HISTORY:  Family History  Problem Relation Age of Onset  . Heart failure Mother   . Stroke Father   . Stroke Brother       SOCIAL HISTORY:  reports that she quit smoking about 21 years ago. She has never used smokeless tobacco. She reports that she does not drink alcohol. The patient is widowed and lives in [place].    ALLERGIES: Fosamax [alendronate], Prednisone, and Penicillins   MEDICATIONS:  Current Outpatient Medications  Medication Sig Dispense Refill  . amLODipine (NORVASC) 2.5 MG tablet TAKE 1 TABLET (2.5 MG TOTAL) BY MOUTH AT BEDTIME. 90 tablet 2  . Biotin 10000 MCG TABS Take 10,000 mg by mouth daily.     . Calcium-Phosphorus-Vitamin D (CITRACAL +D3 PO) Take 1 tablet by mouth daily.     . clopidogrel (PLAVIX) 75 MG tablet Take 1 tablet (75 mg total) by mouth daily. 90 tablet 3  . diphenhydrAMINE (BENADRYL) 25 mg capsule Take 25 mg by mouth at bedtime as needed for allergies.     Marland Kitchen lidocaine-prilocaine (EMLA) cream Apply to affected area once 30 g 3  . LORazepam (ATIVAN) 0.5 MG tablet Take 1 tablet (0.5 mg total) by mouth every 6 (six) hours as needed (Nausea or vomiting). 30 tablet 0  . metoprolol succinate (TOPROL-XL) 25 MG 24 hr tablet Take 25 mg by mouth daily.    . nitroGLYCERIN (NITROSTAT) 0.4 MG SL tablet Place 1 tablet (0.4 mg total) under the tongue every 5 (five) minutes as needed for chest pain. 30 tablet 0  . ondansetron (ZOFRAN) 8 MG tablet Take 1 tablet (8 mg total) by mouth 2 (two) times daily as needed (Nausea or vomiting). 30 tablet 1  . pantoprazole (PROTONIX) 40 MG tablet TAKE 1 TABLET BY MOUTH EVERY DAY 90 tablet 2  . prochlorperazine (COMPAZINE) 10 MG tablet Take 1 tablet (10 mg total) by mouth every 6 (six) hours as needed (Nausea or vomiting). 30 tablet 1  . Propylene Glycol (SYSTANE BALANCE) 0.6 % SOLN Place 2 drops into both eyes as needed (Dry eye).    . rosuvastatin (CRESTOR) 40 MG tablet Take 40 mg by mouth daily.      No current facility-administered medications for this encounter.     REVIEW OF SYSTEMS: On review of systems, the patient reports that she is  doing well overall. No specific complaints are noted.     PHYSICAL EXAM:  Unable to assess due to encounter type.   ECOG = 0  0 - Asymptomatic (Fully active, able to carry on all predisease activities without restriction)  1 - Symptomatic but completely ambulatory (Restricted in physically strenuous activity but ambulatory and able to carry out work of a light or sedentary nature. For example, light housework, office work)  2 - Symptomatic, <50% in bed during the day (Ambulatory and capable of all self care but unable to carry out any work activities. Up and about more than 50% of waking hours)  3 - Symptomatic, >50% in bed, but not bedbound (Capable of only limited self-care, confined to bed or chair 50% or more of waking hours)  4 - Bedbound (Completely disabled. Cannot carry on any self-care. Totally confined to bed or chair)  5 - Death   Eustace Pen MM, Creech RH, Tormey DC, et al. [phone]). "Toxicity and response  criteria of the [facility]". [place] Oncol. 5 (6): 649-55    LABORATORY DATA:  Lab Results  Component Value Date   WBC 8.5 07/13/2019   HGB 10.3 (L) 07/13/2019   HCT 31.4 (L) 07/13/2019   MCV 91 07/13/2019   PLT 514 (H) 07/13/2019   Lab Results  Component Value Date   NA 142 07/13/2019   K 5.0 07/13/2019   CL 104 07/13/2019   CO2 24 07/13/2019   No results found for: ALT, AST, GGT, ALKPHOS, BILITOT    RADIOGRAPHY: US BREAST LTD UNI LEFT INC AXILLA  Result Date: 01/07/2020 CLINICAL DATA:  Patient presents for palpable abnormality within the inferior left breast. EXAM: DIGITAL DIAGNOSTIC BILATERAL MAMMOGRAM WITH CAD AND TOMO ULTRASOUND LEFT BREAST COMPARISON:  Previous exam(s). ACR Breast Density Category c: The breast tissue is heterogeneously dense, which may obscure small masses. FINDINGS: Within the inferior aspect of the left breast middle depth underlying the palpable marker is a lobular obscured mass with associated coarse  calcifications. Postsurgical changes identified within the left and right breast. Mammographic images were processed with CAD. On physical exam, there is a mobile firm mass within the inferior left breast. Targeted ultrasound is performed, showing a 3.3 x 1.9 x 1.6 cm lobular hypoechoic mass with internal color vascularity left breast 5 o'clock position 3 cm from nipple at the site of palpable concern. No left axillary adenopathy. IMPRESSION: Suspicious palpable left breast mass 5 o'clock position. RECOMMENDATION: Ultrasound-guided core needle biopsy palpable left breast mass. If this demonstrates malignant process, recommend bilateral breast MRI given the dense breast tissue bilaterally and postsurgical changes. I have discussed the findings and recommendations with the patient. If applicable, a reminder letter will be sent to the patient regarding the next appointment. BI-RADS CATEGORY  4: Suspicious. Electronically Signed   By: [name] M.D.   On: 01/07/2020 13:28   MM DIAG BREAST TOMO BILATERAL  Result Date: 01/07/2020 CLINICAL DATA:  Patient presents for palpable abnormality within the inferior left breast. EXAM: DIGITAL DIAGNOSTIC BILATERAL MAMMOGRAM WITH CAD AND TOMO ULTRASOUND LEFT BREAST COMPARISON:  Previous exam(s). ACR Breast Density Category c: The breast tissue is heterogeneously dense, which may obscure small masses. FINDINGS: Within the inferior aspect of the left breast middle depth underlying the palpable marker is a lobular obscured mass with associated coarse calcifications. Postsurgical changes identified within the left and right breast. Mammographic images were processed with CAD. On physical exam, there is a mobile firm mass within the inferior left breast. Targeted ultrasound is performed, showing a 3.3 x 1.9 x 1.6 cm lobular hypoechoic mass with internal color vascularity left breast 5 o'clock position 3 cm from nipple at the site of palpable concern. No left axillary adenopathy.  IMPRESSION: Suspicious palpable left breast mass 5 o'clock position. RECOMMENDATION: Ultrasound-guided core needle biopsy palpable left breast mass. If this demonstrates malignant process, recommend bilateral breast MRI given the dense breast tissue bilaterally and postsurgical changes. I have discussed the findings and recommendations with the patient. If applicable, a reminder letter will be sent to the patient regarding the next appointment. BI-RADS CATEGORY  4: Suspicious. Electronically Signed   By: [name] M.D.   On: 01/07/2020 13:28   MM CLIP PLACEMENT LEFT  Result Date: 01/18/2020 CLINICAL DATA:  Evaluate RIBBON clip following ultrasound-guided LEFT breast biopsy. EXAM: DIAGNOSTIC RIGHT MAMMOGRAM POST ULTRASOUND BIOPSY COMPARISON:  Previous exam(s). FINDINGS: Mammographic images were obtained following ultrasound guided biopsy of a 3.3 cm mass within  the LOWER OUTER LEFT breast. The RIBBON biopsy marking clip is in expected position at the site of biopsy. Please note that the patient has an existing COIL biopsy clip within the OUTER central LEFT breast from previous benign biopsy. IMPRESSION: Appropriate positioning of the RIBBON shaped biopsy marking clip at the site of biopsy in the LOWER OUTER LEFT breast. Final Assessment: Post Procedure Mammograms for Marker Placement Electronically Signed   By: Margarette Canada M.D.   On: 01/18/2020 14:38   [place] LT BREAST BX W LOC DEV 1ST LESION IMG BX SPEC US GUIDE  Addendum Date: 01/19/2020   ADDENDUM REPORT: 01/19/2020 12:15 ADDENDUM: Pathology revealed GRADE III INVASIVE DUCTAL CARCINOMA of the LEFT breast, lower outer, 5 o'clock. This was found to be concordant by Dr. Hassan Rowan. Pathology results were discussed with the patient by telephone. The patient reported doing well after the biopsy with tenderness at the site. Post biopsy instructions and care were reviewed and questions were answered. The patient was encouraged to call [facility] for any additional concerns. Surgical consultation has been arranged with Dr. [name] at [facility] on Jan 21, 2020. Breast MRI recommended for extent of disease. Pathology results reported by Stacie Acres RN on 01/19/2020. Electronically Signed   By: Margarette Canada M.D.   On: 01/19/2020 12:15   Result Date: 01/19/2020 CLINICAL DATA:  79 year old female for tissue sampling of a 3.3 cm LOWER OUTER LEFT breast mass. EXAM: ULTRASOUND GUIDED LEFT BREAST CORE NEEDLE BIOPSY COMPARISON:  Previous exam(s). PROCEDURE: I met with the patient and we discussed the procedure of ultrasound-guided biopsy, including benefits and alternatives. We discussed the high likelihood of a successful procedure. We discussed the risks of the procedure, including infection, bleeding, tissue injury, clip migration, and inadequate sampling. Informed written consent was given. The usual time-out protocol was performed immediately prior to the procedure. Lesion quadrant: LOWER OUTER LEFT breast Using sterile technique and 1% Lidocaine as local anesthetic, under direct ultrasound visualization, a 14 gauge spring-loaded device was used to perform biopsy of the 3.3 cm mass at the 5 o'clock position of the LEFT breast using a MEDIAL approach. At the conclusion of the procedure RIBBON tissue marker clip was deployed into the biopsy cavity. Follow up 2 view mammogram was performed and dictated separately. As there was mild oozing and the patient is currently on Plavix and aspirin, a Quik-clot pad was placed. IMPRESSION: Ultrasound guided biopsy of 3.3 cm LOWER OUTER LEFT breast mass. No apparent complications. Electronically Signed: By: Margarette Canada M.D. On: 01/18/2020 14:36       IMPRESSION/PLAN: 1.            Stage IIA, cT2N0M0 grade 3, ER positive, HER2 amplified invasive ductal carcinoma of the left breast. Dr. Lisbeth Renshaw reviews the findings and nature of left breast disease. He agrees with the recommendations for  neoadjuvant chemotherapy to downsize her disease. He discusses that in patients who undergo breast conserving surgery, that they would benefit from adjuvant radiotherapy to the breast to reduce the risks of local recurrence. He discusses the risks, benefits, short, and long term effects of radiotherapy. Dr. Lisbeth Renshaw also discusses the delivery and logistics of treatment, and would offer a course of 4-6  weeks of radiotherapy. The patient is interested in proceeding at the appropriate time. We will plan to meet back 2-3 weeks after surgery to discuss next steps in coordinating her radiotherapy.    Given current concerns for patient exposure during  the COVID-19 pandemic, this encounter was conducted via telephone.  The patient has provided two factor identification and has given verbal consent for this type of encounter and has been advised to only accept a meeting of this type in a secure network environment. The time spent during this encounter was 45 minutes including preparation, discussion, and coordination of the patient's care. The attendants for this meeting include Blenda Nicely, RN, Dr. Lisbeth Renshaw, Hayden Pedro  and [place].  During the encounter,  Blenda Nicely, RN, Dr. Lisbeth Renshaw, and Hayden Pedro were located at [facility] Radiation Oncology Department.  Rachel Valentine was located at home.    The above documentation reflects my direct findings during this shared patient visit. Please see the separate note by Dr. Lisbeth Renshaw on this date for the remainder of the patient's plan of care.    Carola Rhine, PAC

## 2020-01-27 NOTE — Telephone Encounter (Signed)
Scheduled per 5/26 los. Pt aware of appts.  °

## 2020-01-27 NOTE — Telephone Encounter (Signed)
Patient returned call and I was able to briefly discuss the research studies with patient.  This discussion involved review of the purpose of both studies along with timeline for completing study assessments. Informed patient of activities and assessments that would be required prior to starting treatment, including the Cardiac MRI for the Upbeat study.   Informed patient of the voluntary nature of participating in these studies.  Offered to email consent forms to patient for her review and she agreed.  Emailed copies of consents and hippa forms to patient's email ([email or web address]) with plan to follow up with patient on Tuesday.  Thanked patient for her time and encouraged her to email or call research nurse with any questions before next Tuesday. She verbalized understanding.  Foye Spurling, BSN, RN Clinical Research Nurse 01/27/2020

## 2020-01-27 NOTE — Telephone Encounter (Signed)
Dr. Lindi Adie referred patient for possible participation in the [id number] Upbeat Study and the [id number] Neuropathy Study.  Called patient and LVM requesting patient return call when she has time to discuss these studies and provide her with more information if she is interested.  Foye Spurling, BSN, RN Clinical Research Nurse 01/27/2020 9:40 AM

## 2020-01-28 ENCOUNTER — Encounter: Payer: Self-pay | Admitting: *Deleted

## 2020-01-28 NOTE — Progress Notes (Signed)
Patient emailed this research nurse back stating she is not interested in enrolling on the Upbeat study but is interested in the Neuropathy study.  Patient agreed to meet research nurse on Wednesday 02/02/20 after her chemotherapy education class to discuss further.  Informed patient that a research appointment would be added to further discuss the [id number] Neuropathy study on Wednesday 02/02/20.  Foye Spurling, BSN, RN Clinical Research Nurse 01/28/2020 9:31 AM

## 2020-02-02 ENCOUNTER — Inpatient Hospital Stay: Payer: Medicare HMO | Attending: Hematology and Oncology

## 2020-02-02 ENCOUNTER — Ambulatory Visit (HOSPITAL_COMMUNITY)
Admission: RE | Admit: 2020-02-02 | Discharge: 2020-02-02 | Disposition: A | Discharge status: Home / Self Care | Payer: Medicare HMO | Source: Ambulatory Visit | Attending: Hematology and Oncology | Admitting: Hematology and Oncology

## 2020-02-02 ENCOUNTER — Inpatient Hospital Stay: Payer: Medicare HMO | Admitting: *Deleted

## 2020-02-02 ENCOUNTER — Encounter: Payer: Self-pay | Admitting: Hematology and Oncology

## 2020-02-02 ENCOUNTER — Other Ambulatory Visit: Payer: Self-pay

## 2020-02-02 DIAGNOSIS — C50512 Malignant neoplasm of lower-outer quadrant of left female breast: Secondary | ICD-10-CM | POA: Insufficient documentation

## 2020-02-02 DIAGNOSIS — Z87891 Personal history of nicotine dependence: Secondary | ICD-10-CM | POA: Insufficient documentation

## 2020-02-02 DIAGNOSIS — Z170 Estrogen receptor positive status [ER+]: Secondary | ICD-10-CM | POA: Insufficient documentation

## 2020-02-02 DIAGNOSIS — I351 Nonrheumatic aortic (valve) insufficiency: Secondary | ICD-10-CM | POA: Diagnosis not present

## 2020-02-02 DIAGNOSIS — Z5111 Encounter for antineoplastic chemotherapy: Secondary | ICD-10-CM | POA: Insufficient documentation

## 2020-02-02 DIAGNOSIS — I082 Rheumatic disorders of both aortic and tricuspid valves: Secondary | ICD-10-CM | POA: Diagnosis not present

## 2020-02-02 DIAGNOSIS — Z006 Encounter for examination for normal comparison and control in clinical research program: Secondary | ICD-10-CM

## 2020-02-02 DIAGNOSIS — Z79899 Other long term (current) drug therapy: Secondary | ICD-10-CM | POA: Insufficient documentation

## 2020-02-02 NOTE — Research (Signed)
U0156, A PROSPECTIVE OBSERVATIONAL COHORT STUDY TO DEVELOP A PREDICTIVE MODEL OF TAXANE-INDUCED PERIPHERAL NEUROPATHY IN CANCER PATIENTS. CONSENT VISIT: Met with patient after her chemotherapy education class. Patient stated she was still interested in participating in the Neuropathy study and meeting with research nurse to review the study.  Met with patient alone in private exam room for 45 minutes.  Reviewed study consent and hippa forms with patient in their entirety. Explained the purpose of the study along with potential risks and benefits of participation.  Reviewed the study required assessments and timeline for completing these assessments. Informed patient that participation is completely voluntary and she may withdraw consent at any time. All patient's questions were answered and she agreed to participate in this study. Patient signed and dated the consent form for protocol version date 07/11/2019, [place] Active Date 10/26/2019 and hippa form dated 10/31/2017. Patient agreed to the optional use of her blood for future research.  She did not agree to being contacted by the study for future research. Copies of signed/dated consent and hippa forms given to patient for her records.  Plan: Patient is scheduled to start chemotherapy with Taxol/Herceptin in 2 weeks on 02/16/20. Patient agreed to come in early to complete baseline neuropathy assessments before her other appointments.  Patient scheduled for research appt at 8:45 am on 02/16/20. Asked patient to contact research nurse if she has any questions or cannot make this appt for any reason. Thanked patient for her participation in this study.  Foye Spurling, BSN, RN Clinical Research Nurse 02/02/2020 3:02 PM

## 2020-02-02 NOTE — Progress Notes (Signed)
  Echocardiogram 2D Echocardiogram has been performed.  Rachel Valentine 02/02/2020, 11:32 AM

## 2020-02-02 NOTE — Progress Notes (Signed)
Met with patient at registration to introduce myself as Arboriculturist and to offer available resources.  Discussed one-time $1000 Radio broadcast assistant to assist with personal expenses while going through treatment.  Gave her my card if interested in applying and for any additional financial questions or concerns.

## 2020-02-03 ENCOUNTER — Encounter: Payer: Self-pay | Admitting: Hematology and Oncology

## 2020-02-03 NOTE — Progress Notes (Signed)
Patient called to discuss further details regarding [employer] grant.  Based on verbal income provided, she does qualify.Patient will bring proof of income on 02/16/20 and complete application.  She has my card for any additional financial questions or concerns.

## 2020-02-07 ENCOUNTER — Ambulatory Visit (HOSPITAL_COMMUNITY)
Admission: RE | Admit: 2020-02-07 | Discharge: 2020-02-07 | Disposition: A | Discharge status: Home / Self Care | Payer: Medicare HMO | Source: Ambulatory Visit | Attending: Surgery | Admitting: Surgery

## 2020-02-07 ENCOUNTER — Other Ambulatory Visit: Payer: Self-pay

## 2020-02-07 DIAGNOSIS — C50912 Malignant neoplasm of unspecified site of left female breast: Secondary | ICD-10-CM | POA: Insufficient documentation

## 2020-02-07 IMAGING — MR MR BREAST BILAT WO/W CM
9 of 12 series · 30 of 48 positions shown · IV contrast (gadavist)
Comparison: Previous exam(s).

CLINICAL DATA: 78-year-old female with recently diagnosed left
breast cancer.

LABS:  None performed on site.
EXAM:
BILATERAL BREAST MRI WITH AND WITHOUT CONTRAST
TECHNIQUE: Multiplanar, multisequence MR images of both breasts were obtained
prior to and following the intravenous administration of 5 ml of
Gadavist.

[Series 2: T2 · axial · 3.0mm · 0.81mm/px · z∈[-71,+118]mm · 2 of 64 slices shown (1 of 2)]
[im 1/64]
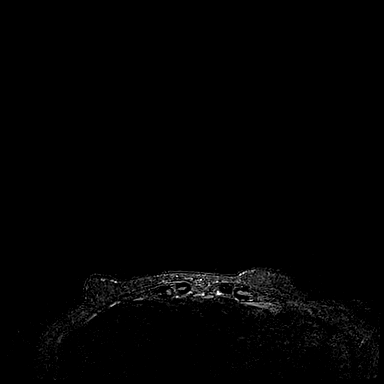
[im 64/64]
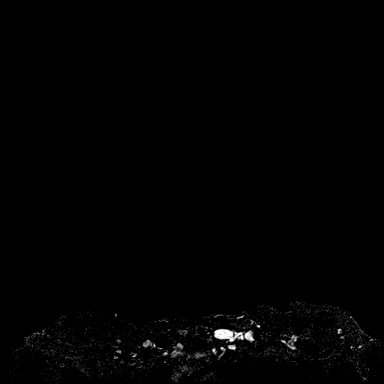

[Series 3: T1 fat-sat · axial · 1.2mm · 0.69mm/px · z∈[-72,+118]mm · 6 of 160 slices shown (1 of 4)]
[im 1/160]
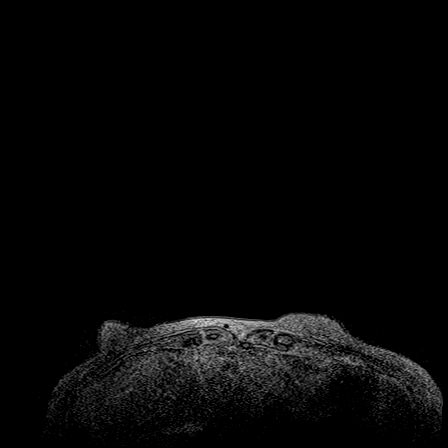
[im 32/160]
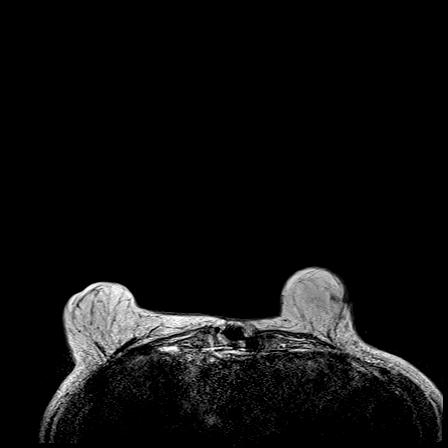
[im 64/160]
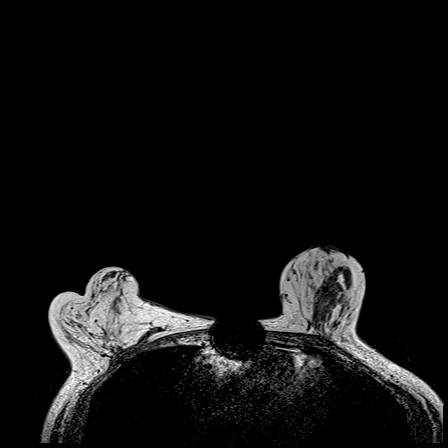
[im 96/160]
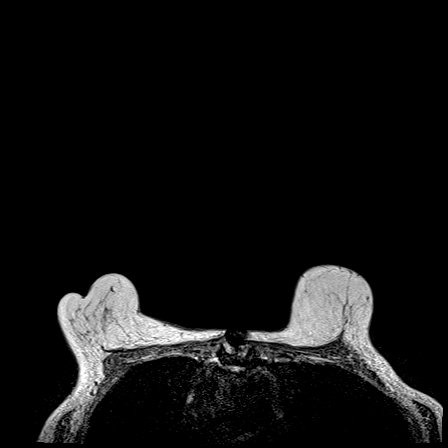
[im 128/160]
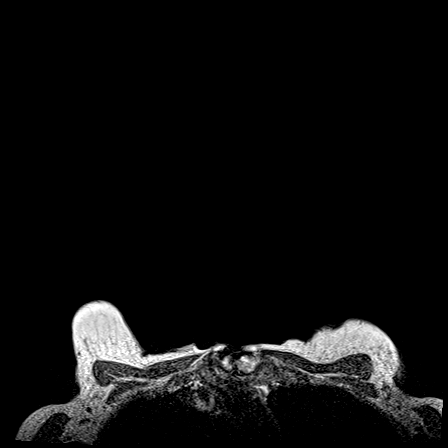
[im 160/160]
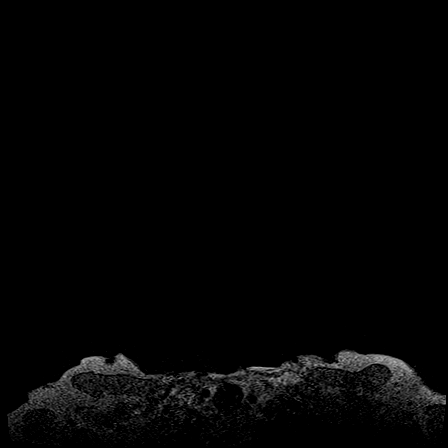

[Series 5: T2 · axial · 3.0mm · 0.97mm/px · z∈[-71,+118]mm · 3 of 62 slices shown (2 of 2)]
[im 1/62]
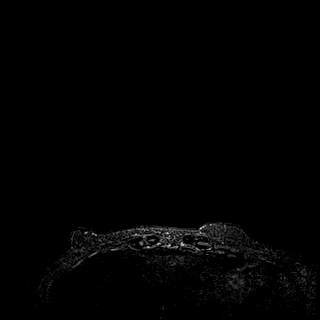
[im 31/62]
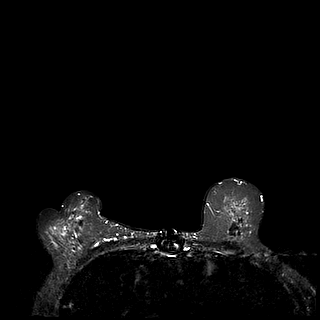
[im 62/62]
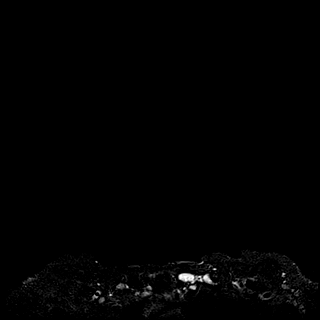

[Series 6: T1 fat-sat · axial · 1.6mm · 0.75mm/px · z∈[-66,+112]mm · 5 of 112 slices shown (2 of 4)]
[im 1/112]
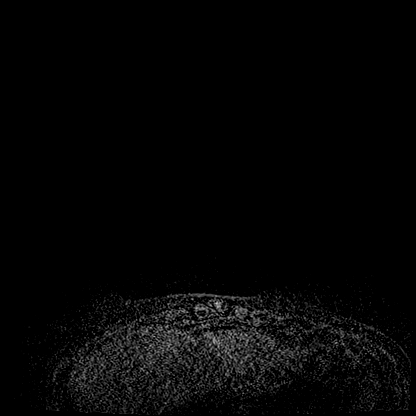
[im 28/112]
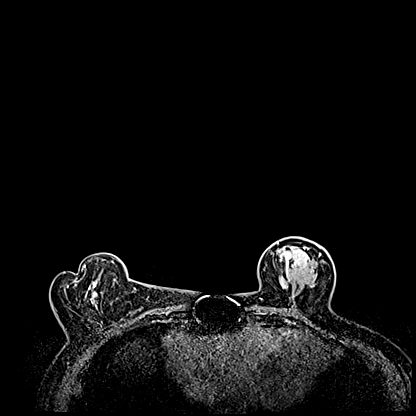
[im 56/112]
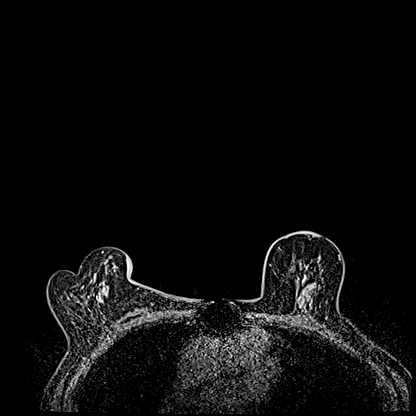
[im 84/112]
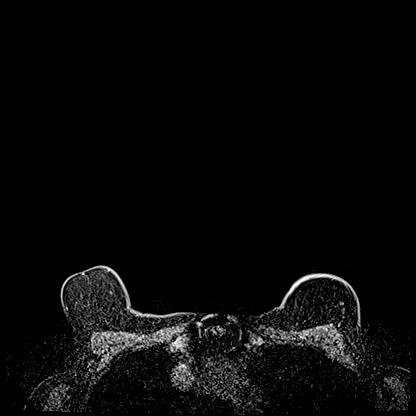
[im 112/112]
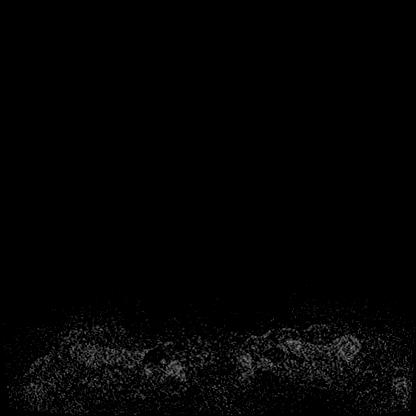

[Series 7: T1 fat-sat · axial · 1.6mm · 0.75mm/px · z∈[-66,+112]mm · 5 of 112 slices shown (3 of 4)]
[im 1/112]
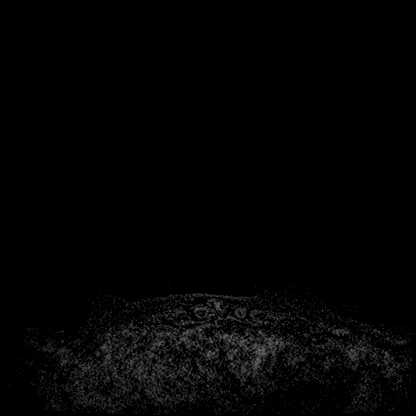
[im 28/112]
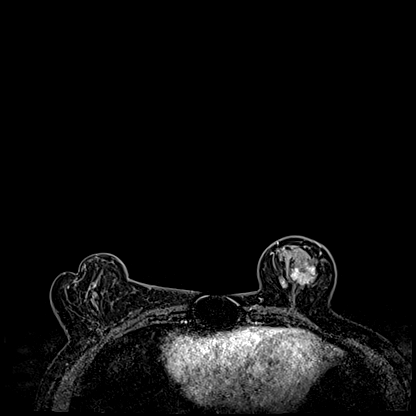
[im 56/112]
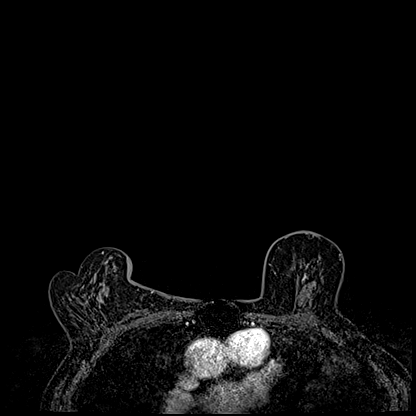
[im 84/112]
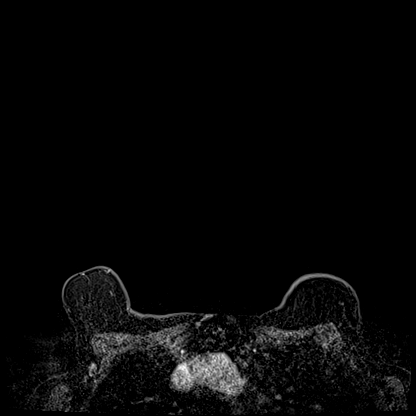
[im 112/112]
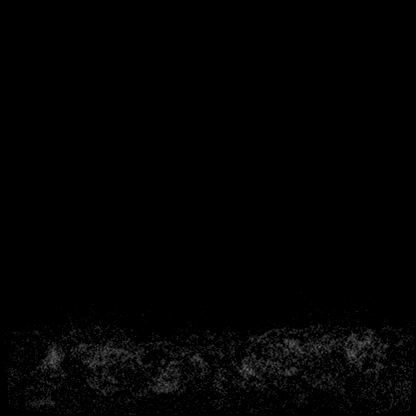

[Series 8: T1 · axial · 1.6mm · 0.75mm/px · z∈[-66,+112]mm · 5 of 112 slices shown (1 of 3)]
[im 1/112]
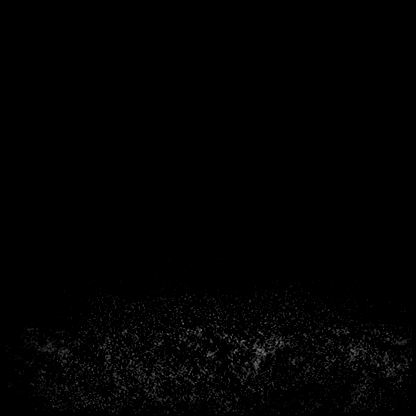
[im 28/112]
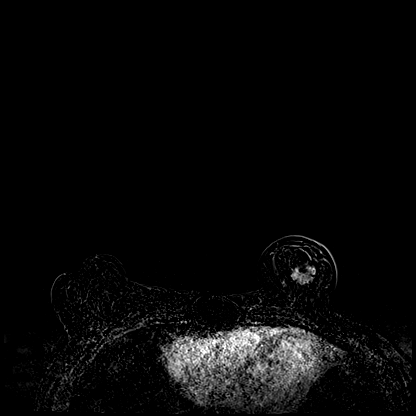
[im 56/112]
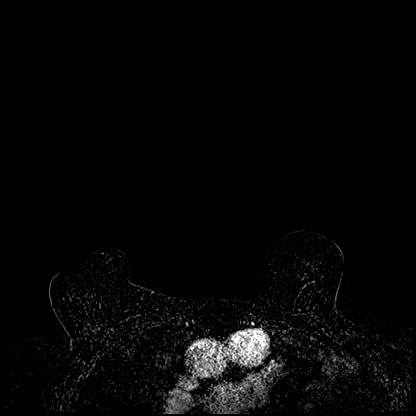
[im 84/112]
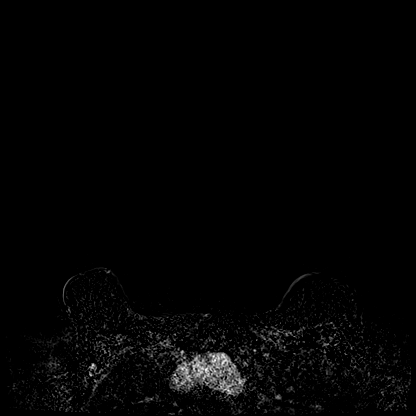
[im 112/112]
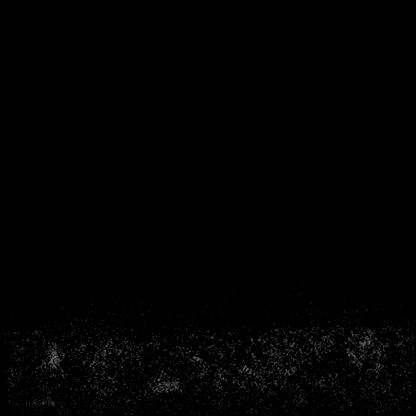

[Series 9: T1 · coronal · 310.0mm · 0.75mm/px · 1 of 3 slices shown (2 of 3)]
[im 1/3]
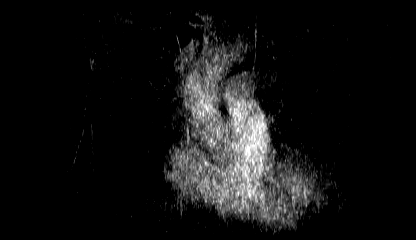

[Series 10: T1 · axial · 179.2mm · 0.75mm/px · 1 of 3 slices shown (3 of 3)]
[im 1/3]
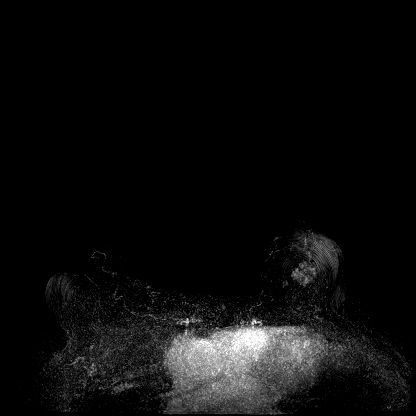

[Series 11: T1 fat-sat · axial · 1.6mm · 0.75mm/px · z∈[-66,-23]mm · 2 of 112 slices shown (4 of 4)]
[im 1/112]
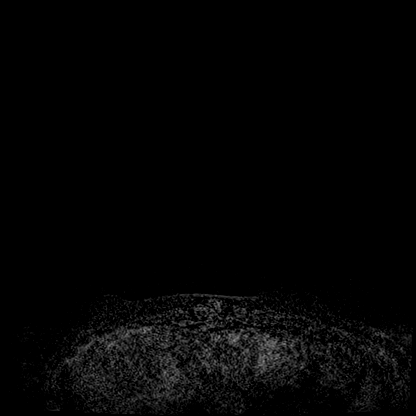
[im 28/112]
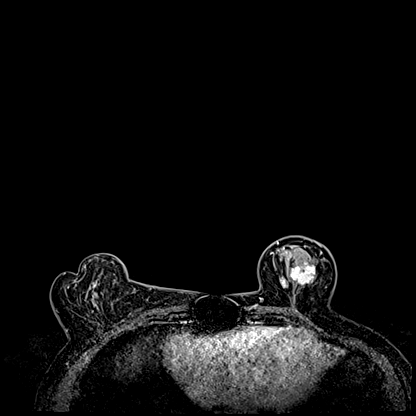

[30 of 48 positions shown; findings below may reference images not displayed]

Three-dimensional MR images were rendered by post-processing of the
original MR data on an independent workstation. The
three-dimensional MR images were interpreted, and findings are
reported in the following complete MRI report for this study. Three
dimensional images were evaluated at the independent [employer]
workstation
FINDINGS: Breast composition: c. Heterogeneous fibroglandular tissue.

Background parenchymal enhancement: Mild.

Right breast: No suspicious mass or abnormal enhancement.

Left breast: Susceptibility artifact is seen in association with an
irregular, enhancing mass in the central inferior left breast
(series 13, image 82/112). This is consistent with the patient's
biopsy-proven site of malignancy. It measures 2 x 2 x 2.3 cm. Just
medial, inferior and slightly posterior to the index lesion, a
second, similar appearing mass is identified (series 13, image
86/112). It measures 8 x 5 x 5 mm and demonstrates washout kinetics.
No suspicious mass or abnormal enhancement in the remainder of the
left breast.

Lymph nodes: No abnormal appearing lymph nodes.

Ancillary findings:  None.
IMPRESSION: 1. 2.3 cm left breast mass consistent with the patient's
biopsy-proven site of malignancy.
2. Indeterminate 8 mm enhancing mass located just medial and
inferior/posterior to the index lesion (series 13, image 86).
Recommendation is for further evaluation with second-look
ultrasound. If no ultrasound correlate is identified, additional MRI
guided biopsy is recommended.
3. No MRI evidence of malignancy on the right.
4. No suspicious lymphadenopathy.

RECOMMENDATION:
1. Second look ultrasound for evaluation of an additional 8 mm mass
located just medial and slightly posterior to the index lesion.
Ultrasound-guided biopsy is recommended if identified.
2. If no ultrasound correlate could be identified, additional MRI
guided biopsy of the left breast is recommended

BI-RADS CATEGORY  4: Suspicious.

## 2020-02-07 MED ORDER — GADOBUTROL 1 MMOL/ML IV SOLN
5.0000 mL | Freq: Once | INTRAVENOUS | Status: AC | PRN
  Administered 2020-02-07: 5 mL via INTRAVENOUS

## 2020-02-08 ENCOUNTER — Telehealth: Payer: Self-pay | Admitting: *Deleted

## 2020-02-08 ENCOUNTER — Encounter: Payer: Self-pay | Admitting: *Deleted

## 2020-02-08 DIAGNOSIS — C50512 Malignant neoplasm of lower-outer quadrant of left female breast: Secondary | ICD-10-CM

## 2020-02-08 DIAGNOSIS — Z170 Estrogen receptor positive status [ER+]: Secondary | ICD-10-CM

## 2020-02-08 NOTE — Telephone Encounter (Signed)
Called pt and discussed MRI results. Informed pt she will need US/bx of left breast mass or MRI bx if can not be seen by [place]. Pt agrees and knows she will receive a call with appts. Physician team notified.

## 2020-02-09 ENCOUNTER — Encounter (HOSPITAL_COMMUNITY)
Admission: RE | Admit: 2020-02-09 | Discharge: 2020-02-09 | Disposition: A | Discharge status: Home / Self Care | Payer: Medicare HMO | Source: Ambulatory Visit | Attending: Surgery | Admitting: Surgery

## 2020-02-09 ENCOUNTER — Other Ambulatory Visit: Payer: Self-pay

## 2020-02-09 ENCOUNTER — Encounter (HOSPITAL_COMMUNITY): Payer: Self-pay

## 2020-02-09 DIAGNOSIS — Z01812 Encounter for preprocedural laboratory examination: Secondary | ICD-10-CM | POA: Insufficient documentation

## 2020-02-09 HISTORY — DX: Essential (primary) hypertension: I10

## 2020-02-09 HISTORY — DX: Malignant (primary) neoplasm, unspecified: C80.1

## 2020-02-09 HISTORY — DX: Other specified postprocedural states: Z98.890

## 2020-02-09 HISTORY — DX: Other specified postprocedural states: R11.2

## 2020-02-09 HISTORY — DX: Pneumonia, unspecified organism: J18.9

## 2020-02-09 LAB — COMPREHENSIVE METABOLIC PANEL WITH GFR
ALT: 11 U/L (ref 0–44)
AST: 22 U/L (ref 15–41)
Albumin: 4.8 g/dL (ref 3.5–5.0)
Alkaline Phosphatase: 79 U/L (ref 38–126)
Anion gap: 9 (ref 5–15)
BUN: 14 mg/dL (ref 8–23)
CO2: 28 mmol/L (ref 22–32)
Calcium: 10.1 mg/dL (ref 8.9–10.3)
Chloride: 102 mmol/L (ref 98–111)
Creatinine, Ser: 0.7 mg/dL (ref 0.44–1.00)
GFR calc Af Amer: >60 mL/min
GFR calc non Af Amer: >60 mL/min
Glucose, Bld: 72 mg/dL (ref 70–99)
Potassium: 4.4 mmol/L (ref 3.5–5.1)
Sodium: 139 mmol/L (ref 135–145)
Total Bilirubin: 0.7 mg/dL (ref 0.3–1.2)
Total Protein: 7.8 g/dL (ref 6.5–8.1)

## 2020-02-09 LAB — CBC WITH DIFFERENTIAL/PLATELET
Abs Immature Granulocytes: 0.02 10*3/uL (ref 0.00–0.07)
Basophils Absolute: 0.1 10*3/uL (ref 0.0–0.1)
Basophils Relative: 1 %
Eosinophils Absolute: 0.3 10*3/uL (ref 0.0–0.5)
Eosinophils Relative: 4 %
HCT: 45.8 % (ref 36.0–46.0)
Hemoglobin: 14.4 g/dL (ref 12.0–15.0)
Immature Granulocytes: 0 %
Lymphocytes Relative: 16 %
Lymphs Abs: 1.1 10*3/uL (ref 0.7–4.0)
MCH: 29.9 pg (ref 26.0–34.0)
MCHC: 31.4 g/dL (ref 30.0–36.0)
MCV: 95.2 fL (ref 80.0–100.0)
Monocytes Absolute: 0.5 10*3/uL (ref 0.1–1.0)
Monocytes Relative: 7 %
Neutro Abs: 4.8 10*3/uL (ref 1.7–7.7)
Neutrophils Relative %: 72 %
Platelets: 273 10*3/uL (ref 150–400)
RBC: 4.81 MIL/uL (ref 3.87–5.11)
RDW: 13.3 % (ref 11.5–15.5)
WBC: 6.6 10*3/uL (ref 4.0–10.5)
nRBC: 0 % (ref 0.0–0.2)

## 2020-02-09 NOTE — Progress Notes (Signed)
[id number] - [place], [place] [address] AT [address] [place] [address] Phone: [phone] Fax: [phone]    Your procedure is scheduled on Tuesday, June 15th.  Report to [facility] Main Entrance "A" at 7:00 A.M., and check in at the Admitting office.  Call this number if you have problems the morning of surgery:  [phone]  Call [phone] if you have any questions prior to your surgery date Monday-Friday 8am-4pm   Remember:  Do not eat after midnight the night before your surgery  You may drink clear liquids until 6:00 A.M. the morning of your surgery.   Clear liquids allowed are: Water, Non-Citrus Juices (without pulp), Carbonated Beverages, Clear Tea, Black Coffee Only, and Gatorade    Take these medicines the morning of surgery with A SIP OF WATER  metoprolol succinate (TOPROL-XL)  pantoprazole (PROTONIX) rosuvastatin (CRESTOR)   If needed - LORazepam (ATIVAN), nitroGLYCERIN (NITROSTAT), ondansetron (ZOFRAN),  prochlorperazine (COMPAZINE),  Propylene Glycol (SYSTANE BALANCE)/Eye drops   Follow your surgeon's instructions on when to stop Aspirin and clopidogrel (PLAVIX).  If no instructions were given by your surgeon then you will need to call the office to get those instructions.    As of today, STOP taking anyand Aspirin containing products, Aleve, Naproxen, Ibuprofen, Motrin, Advil, Goody's, BC's, all herbal medications, fish oil, and all vitamins.             Do not wear jewelry, make up, or nail polish            Do not wear lotions, powders, perfumes, or deodorant.            Do not shave 48 hours prior to surgery.              Do not bring valuables to the hospital.            [facility] is not responsible for any belongings or valuables.  Do NOT Smoke (Tobacco/Vapping) or drink Alcohol 24 hours prior to your procedure If you use a CPAP at night, you may bring all equipment for your overnight  stay.   Contacts, glasses, dentures or bridgework may not be worn into surgery.      For patients admitted to the hospital, discharge time will be determined by your treatment team.   Patients discharged the day of surgery will not be allowed to drive home, and someone needs to stay with them for 24 hours.  Special instructions:   [facility]- Preparing For Surgery  Before surgery, you can play an important role. Because skin is not sterile, your skin needs to be as free of germs as possible. You can reduce the number of germs on your skin by washing with CHG (chlorahexidine gluconate) Soap before surgery.  CHG is an antiseptic cleaner which kills germs and bonds with the skin to continue killing germs even after washing.    Oral Hygiene is also important to reduce your risk of infection.  Remember - BRUSH YOUR TEETH THE MORNING OF SURGERY WITH YOUR REGULAR TOOTHPASTE  Please do not use if you have an allergy to CHG or antibacterial soaps. If your skin becomes reddened/irritated stop using the CHG.  Do not shave (including legs and underarms) for at least 48 hours prior to first CHG shower. It is OK to shave your face.  Please follow these instructions carefully.   1. Shower the NIGHT BEFORE SURGERY and the MORNING OF SURGERY with CHG Soap.  2. If you chose to wash your hair, wash your hair first as usual with your normal shampoo.  3. After you shampoo, rinse your hair and body thoroughly to remove the shampoo.  4. Use CHG as you would any other liquid soap. You can apply CHG directly to the skin and wash gently with a scrungie or a clean washcloth.   5. Apply the CHG Soap to your body ONLY FROM THE NECK DOWN.  Do not use on open wounds or open sores. Avoid contact with your eyes, ears, mouth and genitals (private parts). Wash Face and genitals (private parts)  with your normal soap.   6. Wash thoroughly, paying special attention to the area where your surgery will be  performed.  7. Thoroughly rinse your body with warm water from the neck down.  8. DO NOT shower/wash with your normal soap after using and rinsing off the CHG Soap.  9. Pat yourself dry with a CLEAN TOWEL.  10. Wear CLEAN PAJAMAS to bed the night before surgery, wear comfortable clothes the morning of surgery  11. Place CLEAN SHEETS on your bed the night of your first shower and DO NOT SLEEP WITH PETS.  Day of Surgery: Shower with CHG soap as instructed above. Do not apply any deodorants/lotions.  Please wear clean clothes to the hospital/surgery center.   Remember to brush your teeth WITH YOUR REGULAR TOOTHPASTE.   Please read over the following fact sheets that you were given.

## 2020-02-09 NOTE — Progress Notes (Signed)
PCP - Dr. Lindell Noe Cardiologist - Dr. Gwenlyn Found  PPM/ICD - n/a Device Orders -  Rep Notified -   Chest x-ray - n/a EKG - 07/13/19 Stress Test - pt denies ECHO - 02/02/20 Cardiac Cath - 01/04/19   Blood Thinner Instructions: Follow your surgeon's instructions on when to stop Aspirin and clopidogrel (PLAVIX).  If no instructions were given by your surgeon then you will need to call the office to get those instructions.   Aspirin Instructions: see above  ERAS Protcol - n/a PRE-SURGERY Ensure or G2- n/a  COVID TEST- 02/11/20   Coronavirus Screening  Have you experienced the following symptoms:  Cough yes/no: No Fever (>100.51F)  yes/no: No Runny nose yes/no: No Sore throat yes/no: No Difficulty breathing/shortness of breath  yes/no: No  Have you or a family member traveled in the last 14 days and where? yes/no: No   If the patient indicates "YES" to the above questions, their PAT will be rescheduled to limit the exposure to others and, the surgeon will be notified. THE PATIENT WILL NEED TO BE ASYMPTOMATIC FOR 14 DAYS.   If the patient is not experiencing any of these symptoms, the PAT nurse will instruct them to NOT bring anyone with them to their appointment since they may have these symptoms or traveled as well.   Please remind your patients and families that hospital visitation restrictions are in effect and the importance of the restrictions.     Anesthesia review: n/a   Patient denies shortness of breath, fever, cough and chest pain at PAT appointment   All instructions explained to the patient, with a verbal understanding of the material. Patient agrees to go over the instructions while at home for a better understanding. Patient also instructed to self quarantine after being tested for COVID-19. The opportunity to ask questions was provided.

## 2020-02-10 ENCOUNTER — Ambulatory Visit
Admission: RE | Admit: 2020-02-10 | Discharge: 2020-02-10 | Disposition: A | Discharge status: Home / Self Care | Payer: Medicare HMO | Source: Ambulatory Visit | Attending: Hematology and Oncology | Admitting: Hematology and Oncology

## 2020-02-10 ENCOUNTER — Other Ambulatory Visit: Payer: Self-pay | Admitting: Hematology and Oncology

## 2020-02-10 DIAGNOSIS — N6324 Unspecified lump in the left breast, lower inner quadrant: Secondary | ICD-10-CM | POA: Diagnosis not present

## 2020-02-10 DIAGNOSIS — Z170 Estrogen receptor positive status [ER+]: Secondary | ICD-10-CM

## 2020-02-10 DIAGNOSIS — C50512 Malignant neoplasm of lower-outer quadrant of left female breast: Secondary | ICD-10-CM

## 2020-02-10 DIAGNOSIS — N6323 Unspecified lump in the left breast, lower outer quadrant: Secondary | ICD-10-CM | POA: Diagnosis not present

## 2020-02-10 DIAGNOSIS — N6012 Diffuse cystic mastopathy of left breast: Secondary | ICD-10-CM | POA: Diagnosis not present

## 2020-02-10 IMAGING — US US BREAST*L* LIMITED INC AXILLA
1 series · 4 of 4 positions shown · non-contrast
Comparison: Previous exam(s).

CLINICAL DATA: 78-year-old patient recently diagnosed with left
breast cancer following ultrasound-guided core needle biopsy of a
3.3 cm mass in the 5 o'clock axis of the left breast 3 cm from the
nipple. On recent breast MRI, an 8 mm mass was identified along the
medial inferior aspect of the biopsied mass, with a thin cleft of
normal appearing tissue separating the 2 masses. Second-look
ultrasound recommended to evaluate for this mass. The patient has
her Port-A-Cath placed next [REDACTED] followed by chemotherapy next
[REDACTED].

EXAM:
ULTRASOUND OF THE LEFT BREAST

[Series 1: us breast*left* limited inc axilla · 0.07mm/px · 4 of 4 slices shown]
[im 1/4]
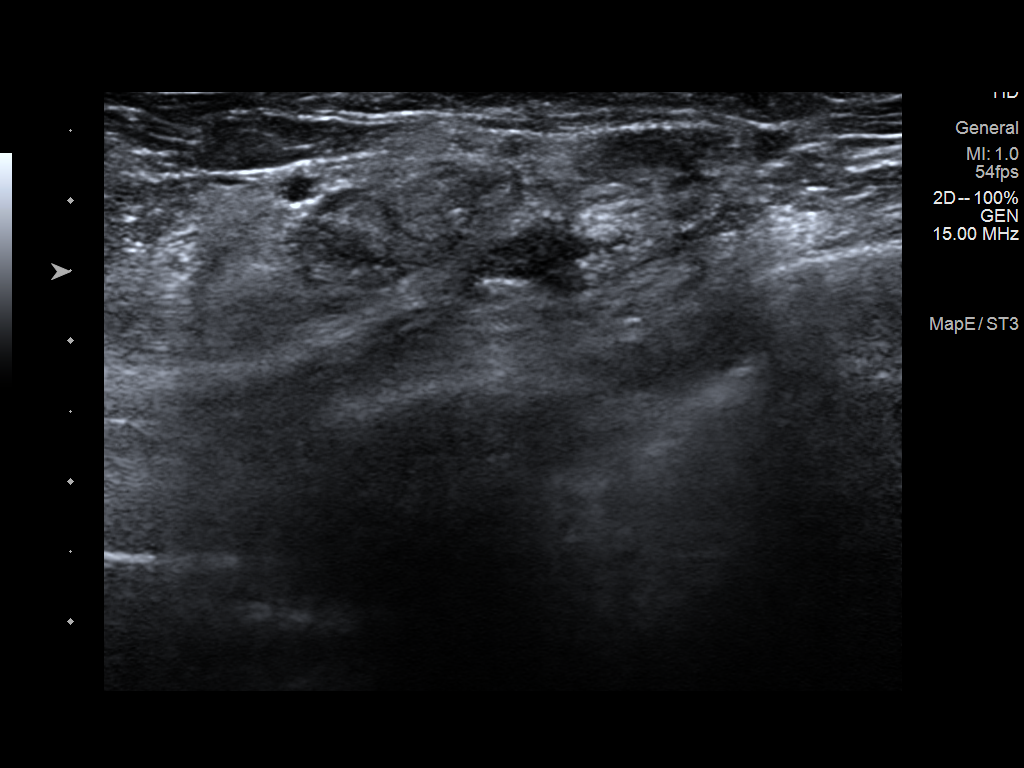
[im 2/4]
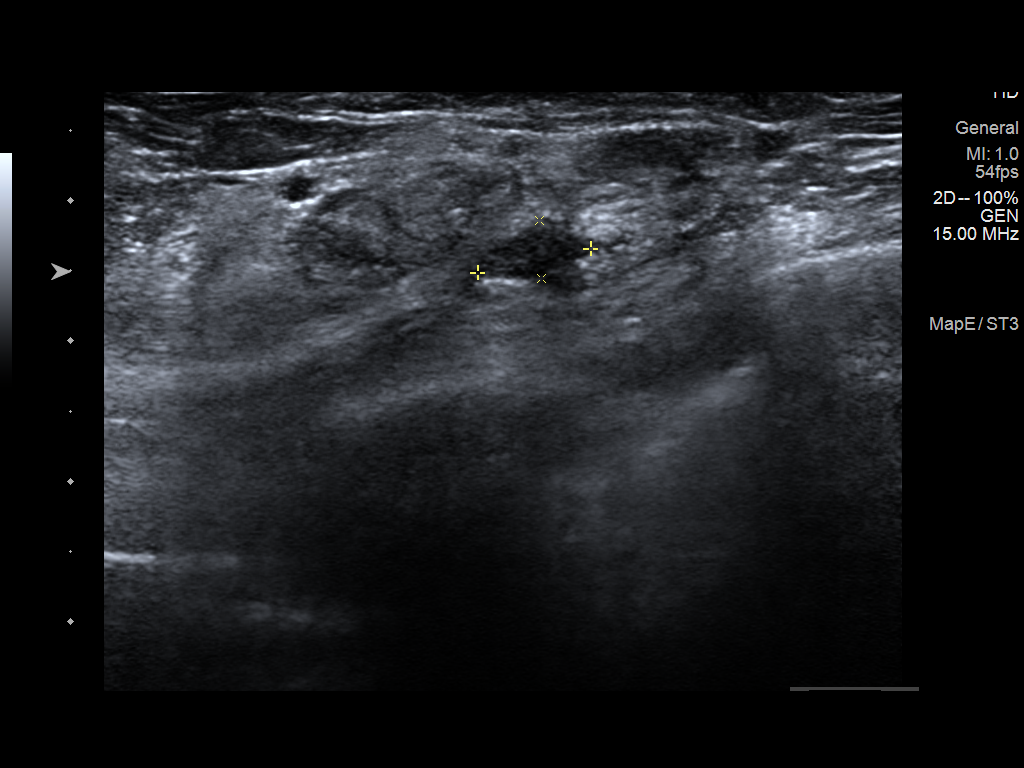
[im 3/4]
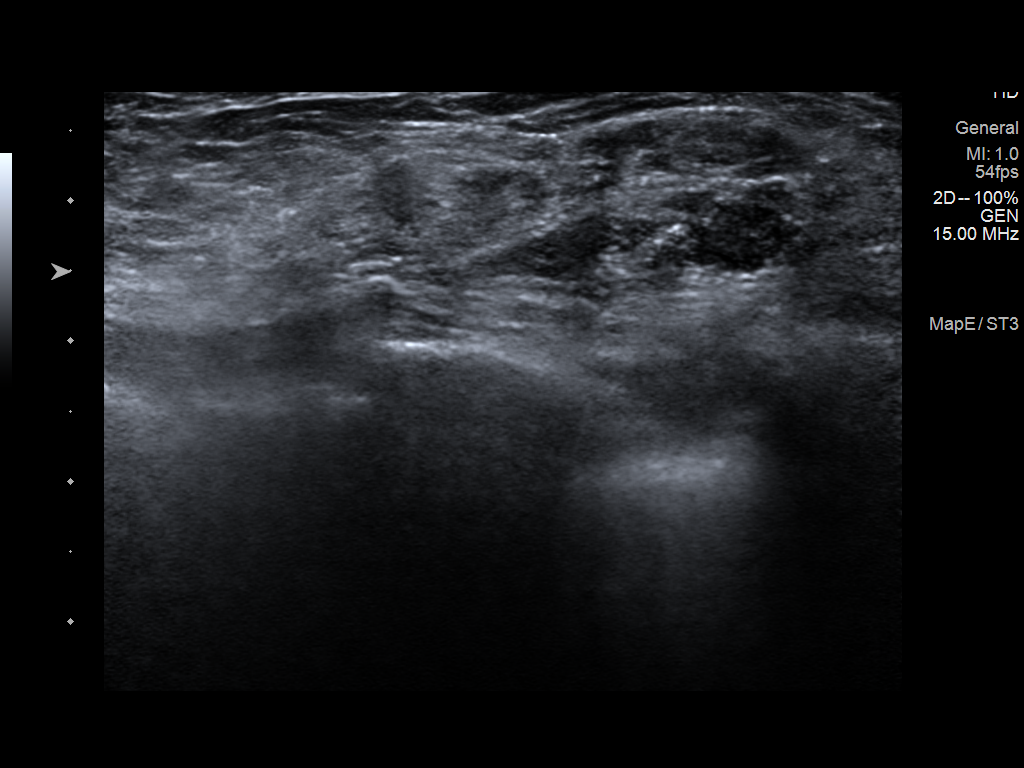
[im 4/4]
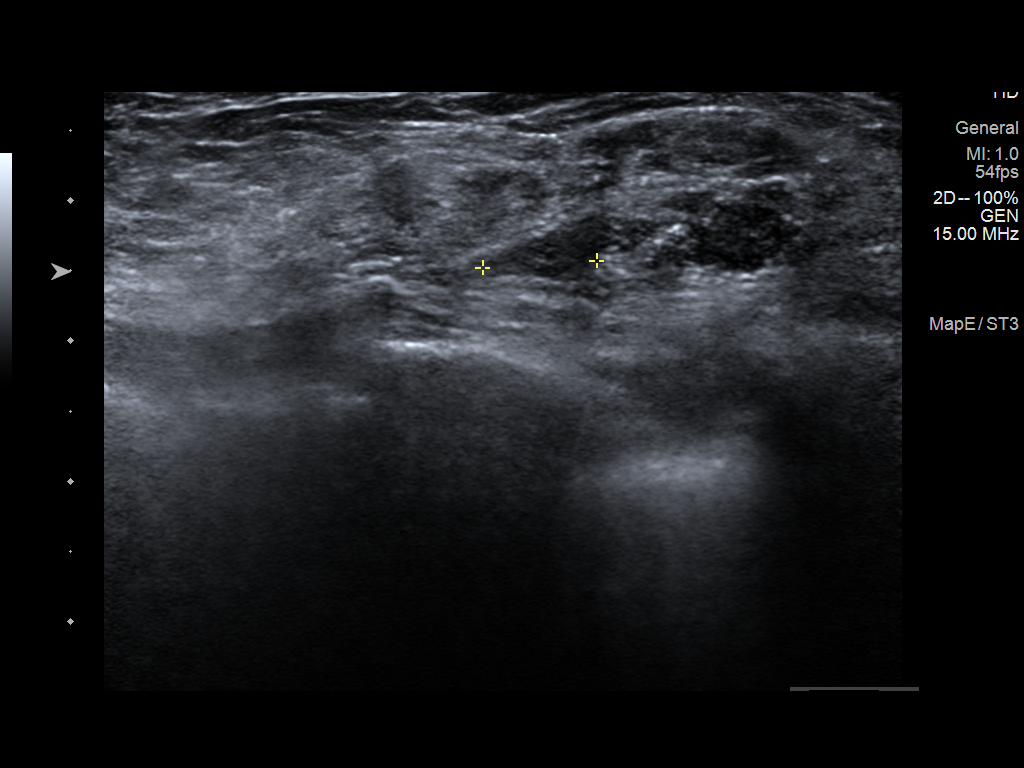

[4 of 4 positions shown; findings below may reference images not displayed]

FINDINGS: On physical exam, there is a palpable mass in the 5 o'clock axis of
the left breast. Postsurgical changes of prior breast reduction.
Mild bruising in the inferior left breast from recent biopsy. Median
sternotomy scar noted.

Targeted ultrasound is performed, showing the recently biopsied
irregular heterogeneously hypoechoic mass in the 5 o'clock axis of
the left breast. Careful ultrasound performed of the tissue medial
to the dominant mass, with particular attention to the area just
medial to the inferior aspect of the biopsy-proven carcinoma. At
[DATE] to [DATE] position approximately 3 cm from the nipple, adjacent
to the pectoralis muscle, is a heterogeneously hypoechoic mass
measuring 0.8 x 0.4 x 0.8 cm.
IMPRESSION: 0.8 cm mass in the 6 o'clock to [DATE] region of the left breast,
close to the recently biopsied cancer. This is thought to correlate
with the MRI finding.

RECOMMENDATION:
Ultrasound-guided core needle biopsy will be performed today and
dictated separately.

I have discussed the findings and recommendations with the patient.
If applicable, a reminder letter will be sent to the patient
regarding the next appointment.

BI-RADS CATEGORY  4: Suspicious.

## 2020-02-10 IMAGING — US US BREAST BX W LOC DEV 1ST LESION IMG BX SPEC US GUIDE*L*
1 series · 9 of 9 positions shown · non-contrast
Comparison: Previous exam(s).
COMPARISON: Previous exam(s).

Addendum:
CLINICAL DATA: Ultrasound-guided core needle biopsy was recommended
of a 0.8 cm hypoechoic mass in the deep aspect of the left breast at
6 o'clock to [DATE] position 3 cm from the nipple. The patient was
recently diagnosed with left breast cancer.

EXAM:
ULTRASOUND GUIDED LEFT BREAST CORE NEEDLE BIOPSY

[Series 1: us breast bx w loc dev 1st lesion img bx spec us g · 0.06mm/px · 9 of 9 slices shown]
[im 1/9]
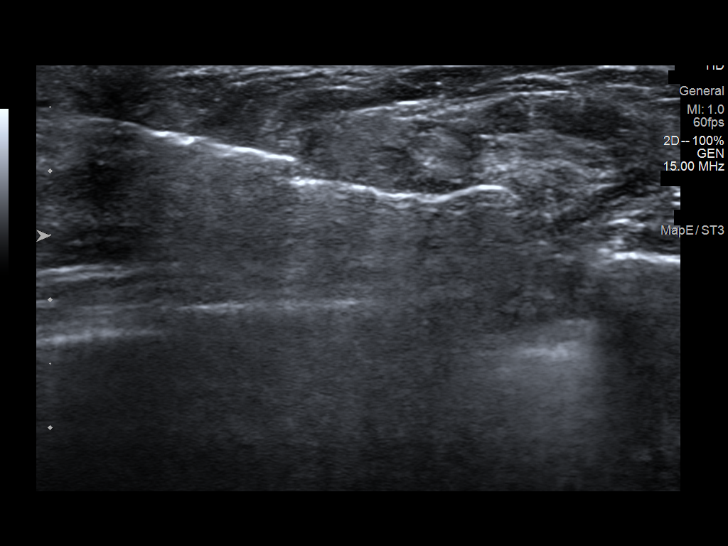
[im 2/9]
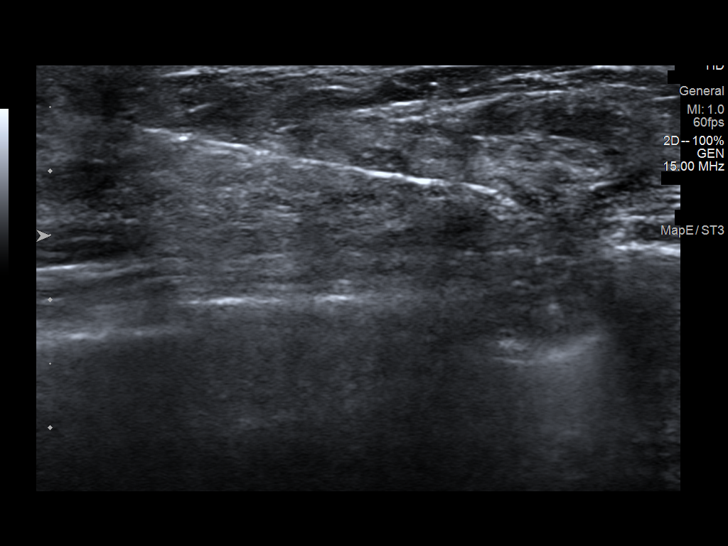
[im 3/9]
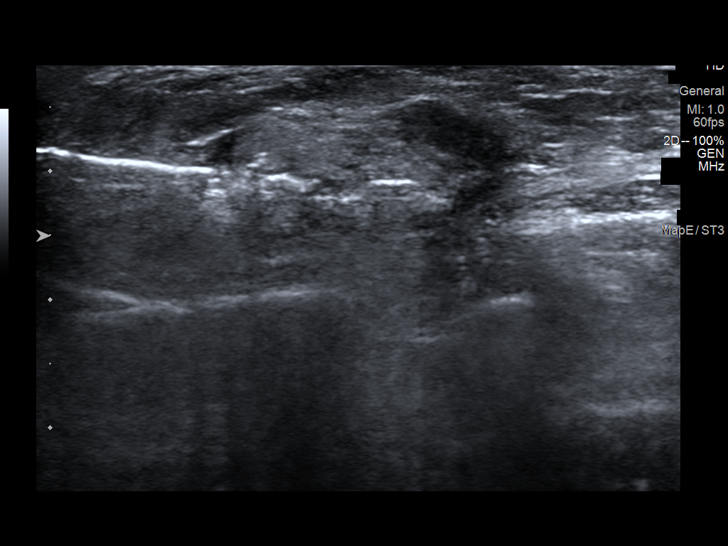
[im 4/9]
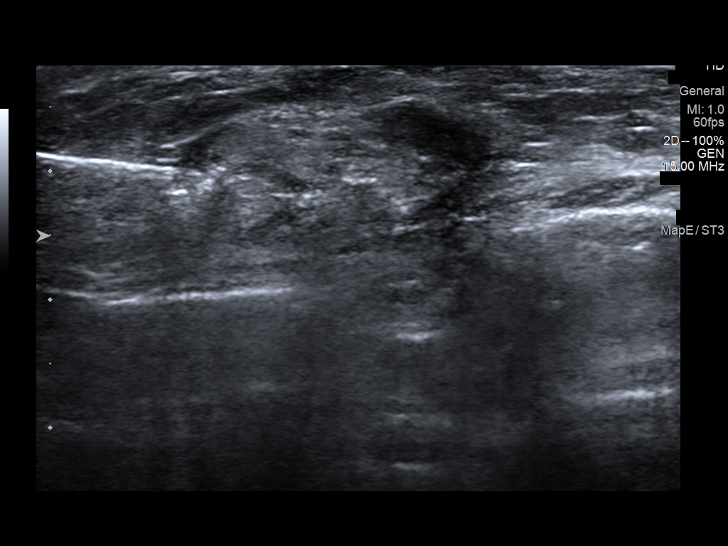
[im 5/9]
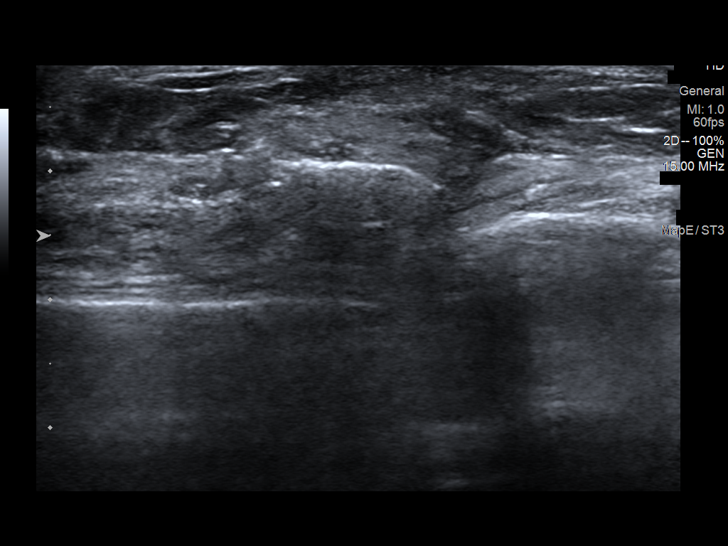
[im 6/9]
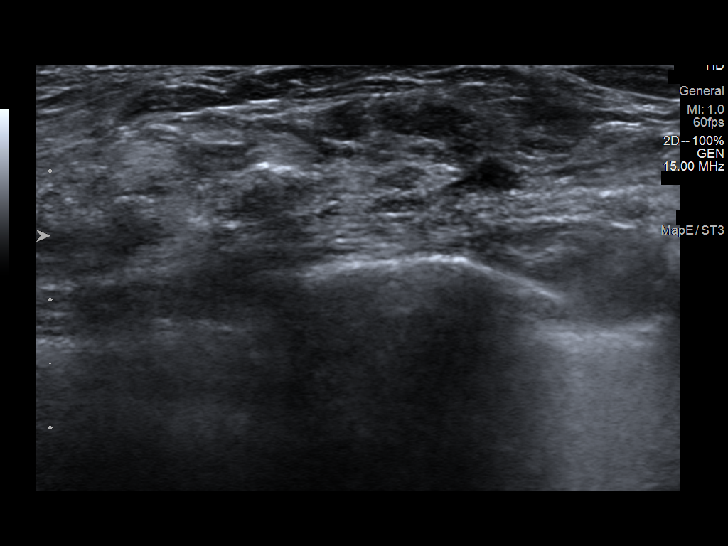
[im 7/9]
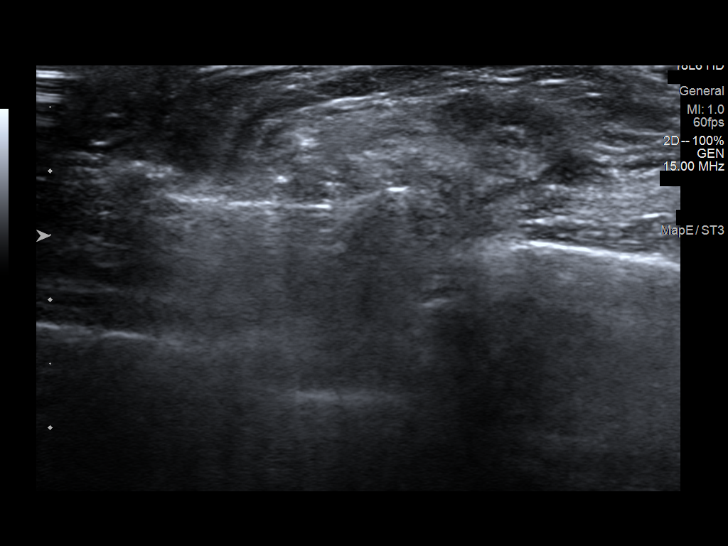
[im 8/9]
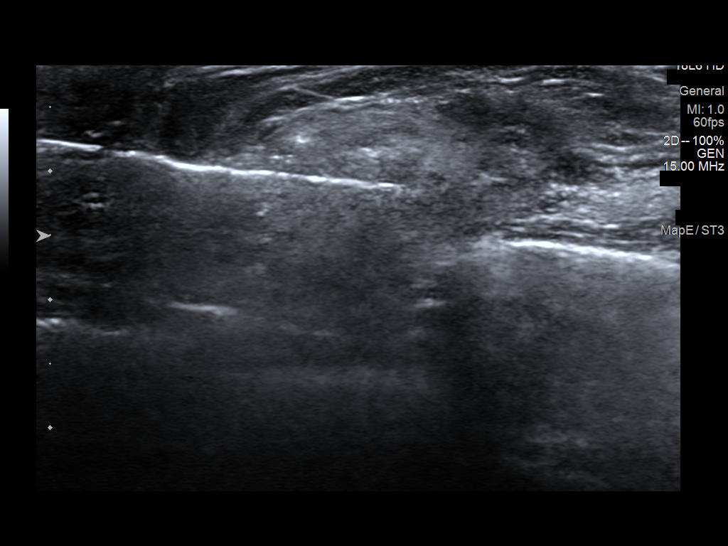
[im 9/9]
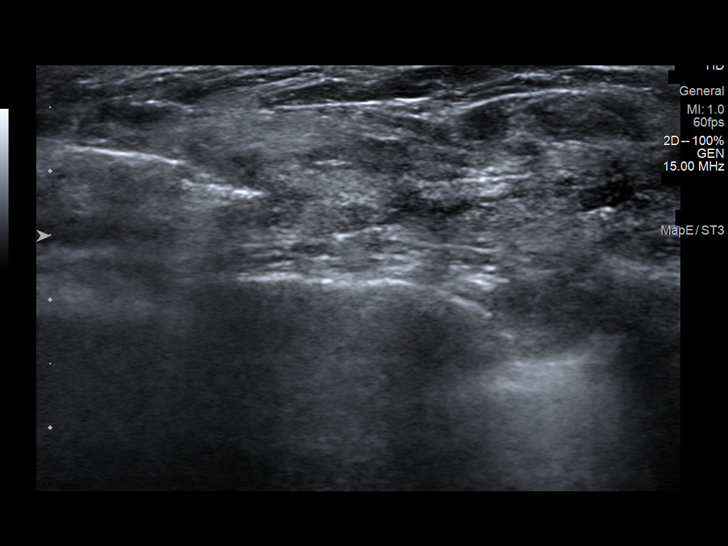

[9 of 9 positions shown; findings below may reference images not displayed]



Lesion quadrant: Lower inner quadrant

Using sterile technique and 1% Lidocaine as local anesthetic, under
direct ultrasound visualization, a 14 gauge DREADHEAD device was
used to perform biopsy of a 0.8 cm hypoechoic mass adjacent to the
pectoralis muscle and medial to the inferior aspect of the recently
biopsy-proven carcinoma using a medial approach. At the conclusion
of the procedure coil tissue marker clip was deployed, and it is
suspected that the biopsy clip deployed superficial to the biopsied
mass. Biopsy clip could not be well visualized with ultrasound.
Follow up 2 view mammogram was performed and dictated separately.
IMPRESSION: Ultrasound guided biopsy of the right breast. No apparent
complications.

ADDENDUM:
Pathology revealed FIBROCYSTIC CHANGE of the Left breast, 6 o'clock,
[67]. This was found to be concordant to explain the ultrasound
finding by Dr. DREADHEAD. However, fibrocystic change is not
thought to be concordant with the suspicious findings on breast MRI;
therefore MRI-guided breast biopsy of the 0.8 cm enhancing mass is
recommended if breast conservation is desired.

Pathology results were discussed with the patient by telephone. The
patient reported doing well after the biopsy with tenderness at the
site. Post biopsy instructions and care were reviewed and questions
were answered. The patient was encouraged to call The [REDACTED] for any additional concerns. My direct phone
number was provided for the patient.

The patient has a recent diagnosis of Left breast cancer and should
follow her outlined treatment plan.

The patient is scheduled for a Left breast MRI guided biopsy on [DATE]. Further recommendations will be guided by the results of
this biopsy.

Pathology results reported by DREADHEAD, RN on [DATE].



Lesion quadrant: Lower inner quadrant

Using sterile technique and 1% Lidocaine as local anesthetic, under
direct ultrasound visualization, a 14 gauge DREADHEAD device was
used to perform biopsy of a 0.8 cm hypoechoic mass adjacent to the
pectoralis muscle and medial to the inferior aspect of the recently
biopsy-proven carcinoma using a medial approach. At the conclusion
of the procedure coil tissue marker clip was deployed, and it is
suspected that the biopsy clip deployed superficial to the biopsied
mass. Biopsy clip could not be well visualized with ultrasound.
Follow up 2 view mammogram was performed and dictated separately.
IMPRESSION: Ultrasound guided biopsy of the right breast. No apparent
complications.

## 2020-02-10 IMAGING — MG MM BREAST LOCALIZATION CLIP
4 series · 4 of 12 positions shown · non-contrast
Comparison: Previous exam(s)., including recent breast MRI

CLINICAL DATA: Ultrasound-guided core needle biopsy was performed
of a 0.8 cm mass in the deep aspect of the left breast at 6 o'clock
to [DATE] position, lower inner quadrant.

EXAM:
DIAGNOSTIC LEFT MAMMOGRAM POST ULTRASOUND BIOPSY

[L ML synth-2D]
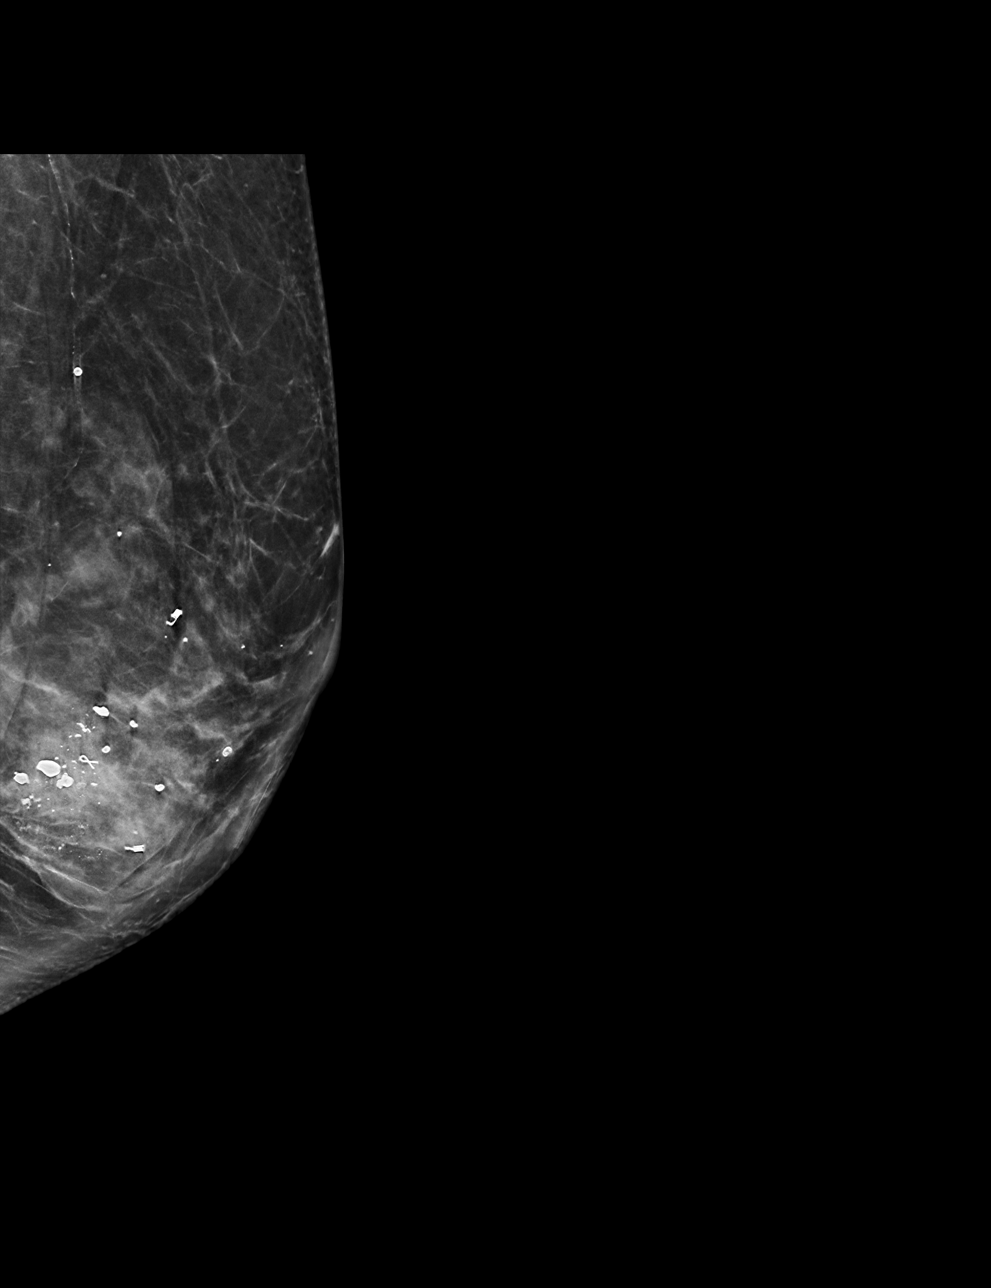

[L CC synth-2D]
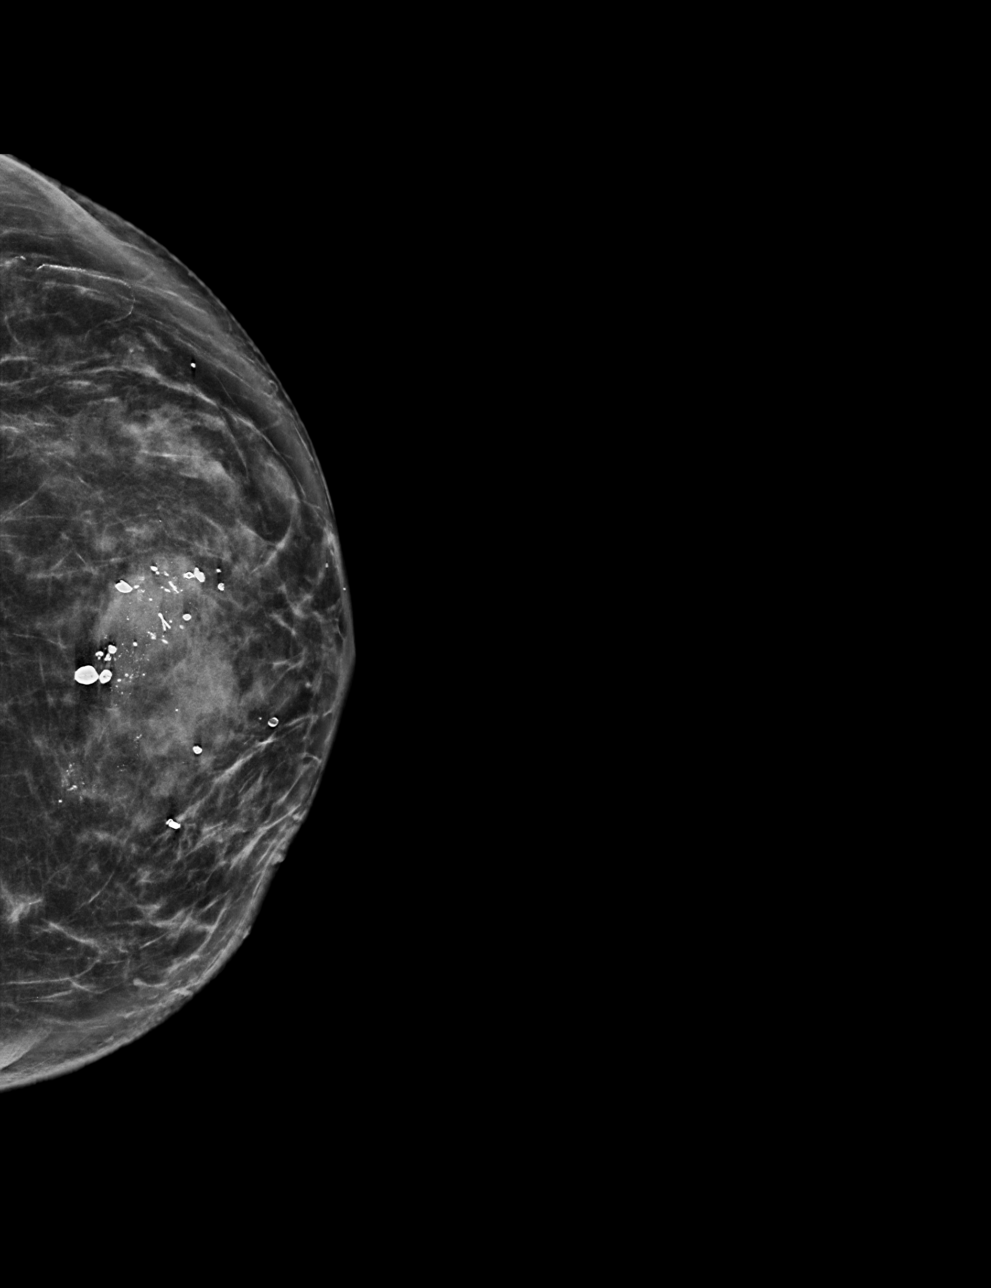

[L CC tomo · tomo slice 33/64.0]
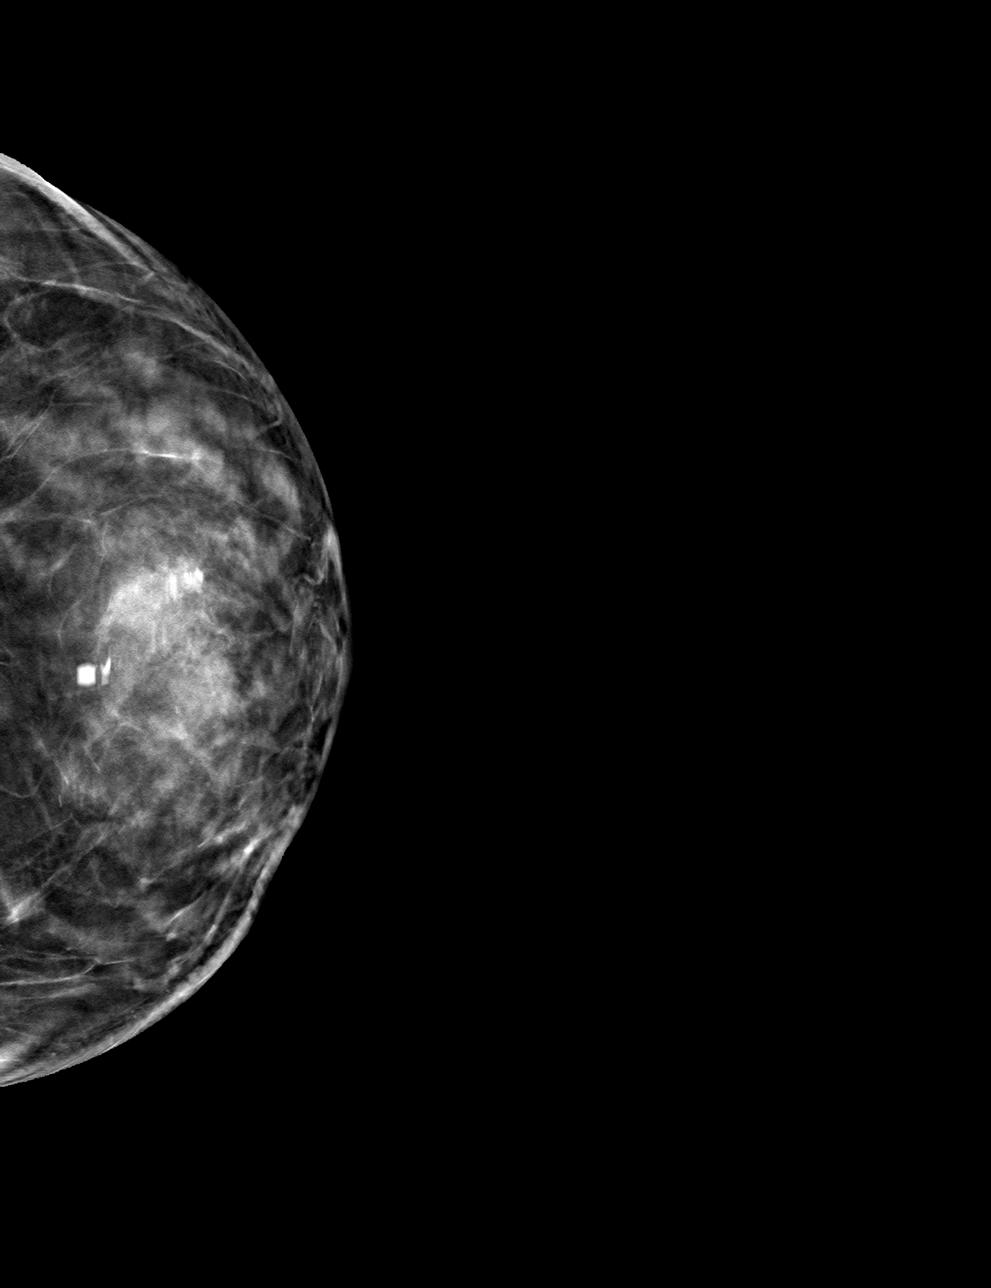

[L ML tomo · tomo slice 34/67.0]
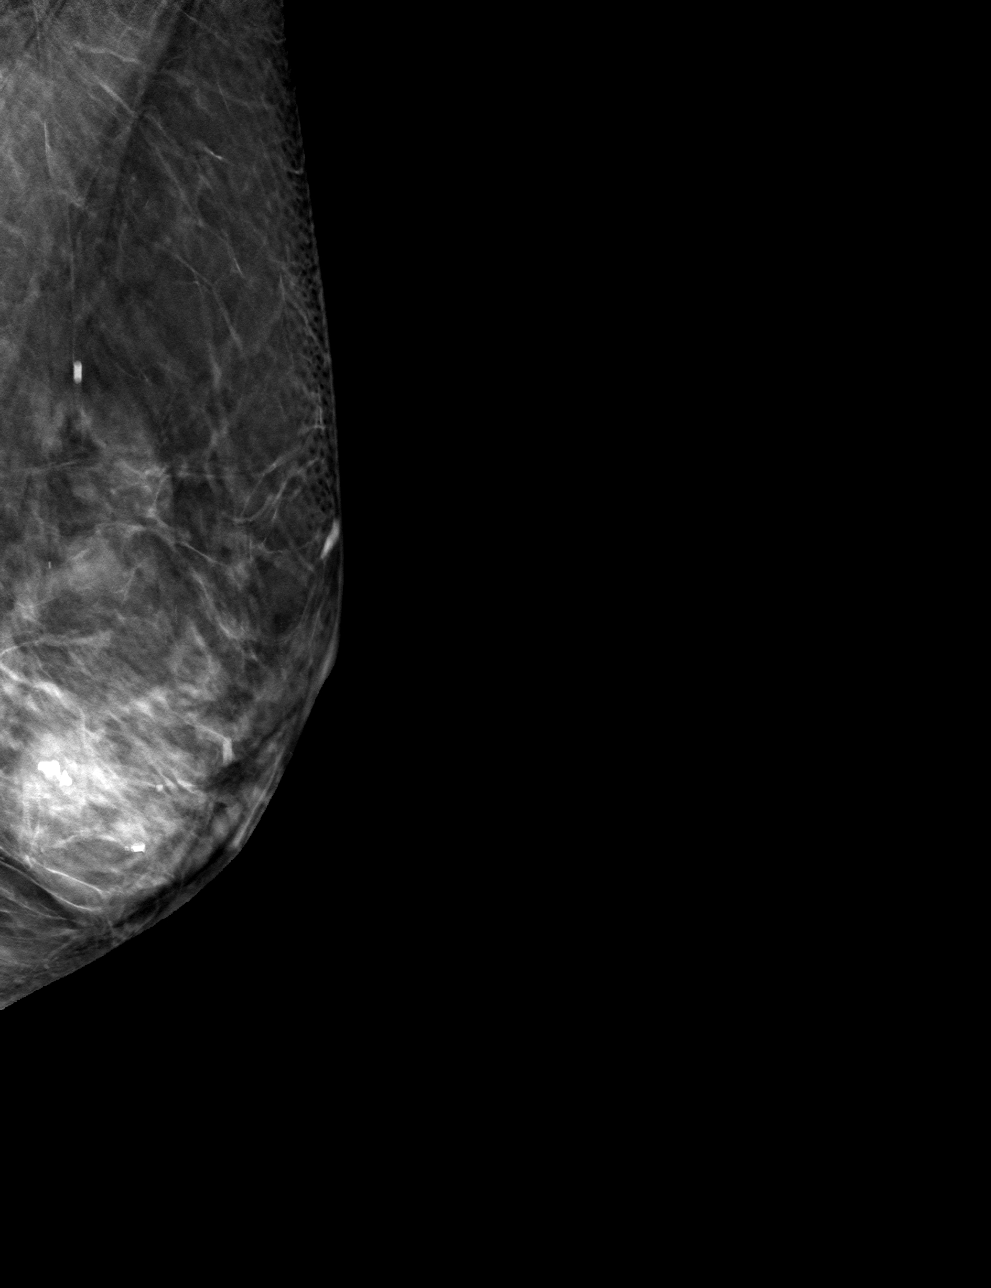

[4 of 12 positions shown; findings below may reference images not displayed]

FINDINGS: Mammographic images were obtained following ultrasound guided biopsy
of the left breast. The biopsy marking clip is thought to be
superficial to the biopsied area, which was adjacent to the
pectoralis muscle with the patient lying supine.
IMPRESSION: I suspect that the coil shaped biopsy clip migrated superficially as
I withdrew the needle containing the biopsy clip today. But if the
biopsy results are positive for malignancy, the coil shaped biopsy
clip could be useful as providing a medial anterior margin for the
preoperative localization.

Final Assessment: Post Procedure Mammograms for Marker Placement

## 2020-02-11 ENCOUNTER — Encounter: Payer: Self-pay | Admitting: Hematology and Oncology

## 2020-02-11 ENCOUNTER — Other Ambulatory Visit: Payer: Self-pay | Admitting: Hematology and Oncology

## 2020-02-11 ENCOUNTER — Encounter: Payer: Self-pay | Admitting: *Deleted

## 2020-02-11 ENCOUNTER — Other Ambulatory Visit (HOSPITAL_COMMUNITY)
Admission: RE | Admit: 2020-02-11 | Discharge: 2020-02-11 | Disposition: A | Discharge status: Home / Self Care | Payer: Medicare HMO | Source: Ambulatory Visit | Attending: Surgery | Admitting: Surgery

## 2020-02-11 DIAGNOSIS — Z01812 Encounter for preprocedural laboratory examination: Secondary | ICD-10-CM | POA: Insufficient documentation

## 2020-02-11 DIAGNOSIS — C50512 Malignant neoplasm of lower-outer quadrant of left female breast: Secondary | ICD-10-CM

## 2020-02-11 DIAGNOSIS — Z20822 Contact with and (suspected) exposure to covid-19: Secondary | ICD-10-CM | POA: Diagnosis not present

## 2020-02-11 DIAGNOSIS — Z170 Estrogen receptor positive status [ER+]: Secondary | ICD-10-CM

## 2020-02-11 LAB — SARS CORONAVIRUS 2 (TAT 6-24 HRS): SARS Coronavirus 2: NEGATIVE

## 2020-02-11 NOTE — Progress Notes (Signed)
Pharmacist Chemotherapy Monitoring - Initial Assessment    Anticipated start date: 02/16/2020   Regimen:  . Are orders appropriate based on the patient's diagnosis, regimen, and cycle? Yes . Does the plan date match the patient's scheduled date? Yes . Is the sequencing of drugs appropriate? Yes . Are the premedications appropriate for the patient's regimen? Yes . Prior Authorization for treatment is: Not Started o If applicable, is the correct biosimilar selected based on the patient's insurance? yes  Organ Function and Labs: Marland Kitchen Are dose adjustments needed based on the patient's renal function, hepatic function, or hematologic function? Yes . Are appropriate labs ordered prior to the start of patient's treatment? Yes . Other organ system assessment, if indicated: trastuzumab: Echo/ MUGA . The following baseline labs, if indicated, have been ordered: N/A  Dose Assessment: . Are the drug doses appropriate? Yes . Are the following correct: o Drug concentrations Yes o IV fluid compatible with drug Yes o Administration routes Yes o Timing of therapy Yes . If applicable, does the patient have documented access for treatment and/or plans for port-a-cath placement? yes . If applicable, have lifetime cumulative doses been properly documented and assessed? not applicable Lifetime Dose Tracking  No doses have been documented on this patient for the following tracked chemicals: Doxorubicin, Epirubicin, Idarubicin, Daunorubicin, Mitoxantrone, Bleomycin, Oxaliplatin, Carboplatin, Liposomal Doxorubicin  o   Toxicity Monitoring/Prevention: . The patient has the following take home antiemetics prescribed: Prochlorperazine . The patient has the following take home medications prescribed: N/A . Medication allergies and previous infusion related reactions, if applicable, have been reviewed and addressed. No . The patient's current medication list has been assessed for drug-drug interactions with their  chemotherapy regimen. no significant drug-drug interactions were identified on review.  Order Review: . Are the treatment plan orders signed? Yes . Is the patient scheduled to see a provider prior to their treatment? No  I verify that I have reviewed each item in the above checklist and answered each question accordingly.  Darina Hartwell D 02/11/2020 4:09 PM

## 2020-02-14 NOTE — Progress Notes (Signed)
.  The following biosimilar Kanjinti has been selected for use in this patient due to insurance requirements.  Fadumo Heng, PharmD  

## 2020-02-15 ENCOUNTER — Ambulatory Visit (HOSPITAL_COMMUNITY)
Admission: RE | Admit: 2020-02-15 | Discharge: 2020-02-15 | Disposition: A | Discharge status: Home / Self Care | Payer: Medicare HMO | Attending: Surgery | Admitting: Surgery

## 2020-02-15 ENCOUNTER — Ambulatory Visit (HOSPITAL_COMMUNITY): Payer: Medicare HMO

## 2020-02-15 ENCOUNTER — Encounter (HOSPITAL_COMMUNITY)
Admission: RE | Disposition: A | Discharge status: Home / Self Care | Payer: Self-pay | Source: Home / Self Care | Attending: Surgery

## 2020-02-15 ENCOUNTER — Other Ambulatory Visit: Payer: Self-pay

## 2020-02-15 ENCOUNTER — Encounter (HOSPITAL_COMMUNITY): Payer: Self-pay | Admitting: Surgery

## 2020-02-15 ENCOUNTER — Ambulatory Visit (HOSPITAL_COMMUNITY): Payer: Medicare HMO | Admitting: Certified Registered"

## 2020-02-15 DIAGNOSIS — Z7982 Long term (current) use of aspirin: Secondary | ICD-10-CM | POA: Insufficient documentation

## 2020-02-15 DIAGNOSIS — Z170 Estrogen receptor positive status [ER+]: Secondary | ICD-10-CM | POA: Insufficient documentation

## 2020-02-15 DIAGNOSIS — Z79899 Other long term (current) drug therapy: Secondary | ICD-10-CM | POA: Insufficient documentation

## 2020-02-15 DIAGNOSIS — C50112 Malignant neoplasm of central portion of left female breast: Secondary | ICD-10-CM | POA: Insufficient documentation

## 2020-02-15 DIAGNOSIS — Z452 Encounter for adjustment and management of vascular access device: Secondary | ICD-10-CM | POA: Diagnosis not present

## 2020-02-15 DIAGNOSIS — I10 Essential (primary) hypertension: Secondary | ICD-10-CM | POA: Diagnosis not present

## 2020-02-15 DIAGNOSIS — R918 Other nonspecific abnormal finding of lung field: Secondary | ICD-10-CM | POA: Diagnosis not present

## 2020-02-15 DIAGNOSIS — Z87891 Personal history of nicotine dependence: Secondary | ICD-10-CM | POA: Insufficient documentation

## 2020-02-15 DIAGNOSIS — Z95828 Presence of other vascular implants and grafts: Secondary | ICD-10-CM

## 2020-02-15 DIAGNOSIS — C50912 Malignant neoplasm of unspecified site of left female breast: Secondary | ICD-10-CM | POA: Diagnosis not present

## 2020-02-15 DIAGNOSIS — E7800 Pure hypercholesterolemia, unspecified: Secondary | ICD-10-CM | POA: Diagnosis not present

## 2020-02-15 DIAGNOSIS — Z7902 Long term (current) use of antithrombotics/antiplatelets: Secondary | ICD-10-CM | POA: Diagnosis not present

## 2020-02-15 DIAGNOSIS — Z419 Encounter for procedure for purposes other than remedying health state, unspecified: Secondary | ICD-10-CM

## 2020-02-15 DIAGNOSIS — I25110 Atherosclerotic heart disease of native coronary artery with unstable angina pectoris: Secondary | ICD-10-CM | POA: Diagnosis not present

## 2020-02-15 HISTORY — PX: PORTACATH PLACEMENT: SHX2246

## 2020-02-15 IMAGING — DX DG CHEST 1V PORT
1 series · 1 of 1 positions shown · non-contrast
Comparison: [DATE]

CLINICAL DATA: Port placement

EXAM:
PORTABLE CHEST 1 VIEW

[chest ap]
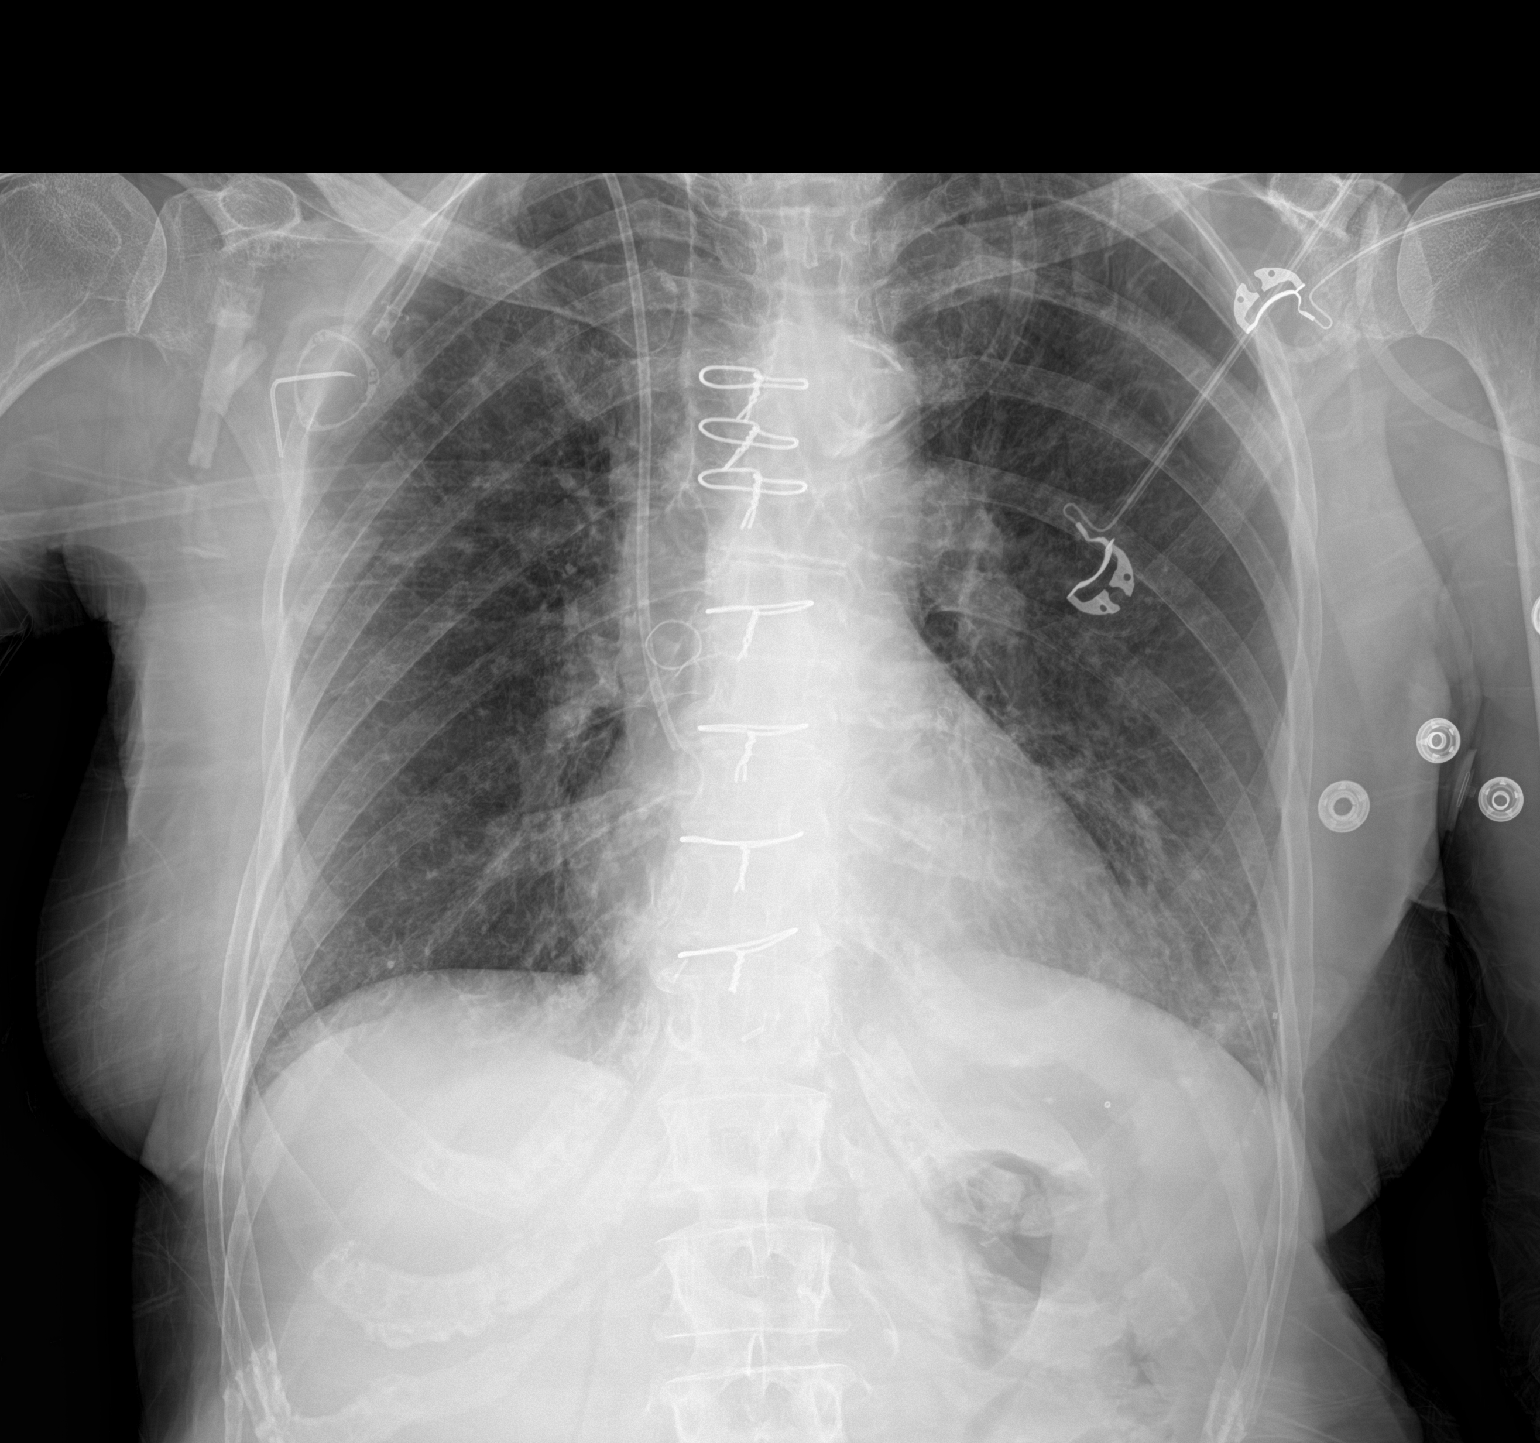

[1 of 1 positions shown; findings below may reference images not displayed]

FINDINGS: Interval placement of right internal jugular approach Port-A-Cath
with distal tip terminating at the level of the superior cavoatrial
junction. Post CABG changes. Normal heart size. Atherosclerotic
calcification of the aortic knob. Streaky left basilar opacity. No
pleural effusion or pneumothorax.
IMPRESSION: 1. Interval placement of right internal jugular approach Port-A-Cath
with distal tip terminating at the level of the superior cavoatrial
junction. No pneumothorax.
2. Streaky left basilar opacity which may reflect atelectasis versus
infiltrate.

## 2020-02-15 SURGERY — INSERTION, TUNNELED CENTRAL VENOUS DEVICE, WITH PORT
Anesthesia: General | Site: Chest | Laterality: Right

## 2020-02-15 MED ORDER — LIDOCAINE 2% (20 MG/ML) 5 ML SYRINGE
INTRAMUSCULAR | Status: AC
  Filled 2020-02-15: qty 5

## 2020-02-15 MED ORDER — DEXAMETHASONE SODIUM PHOSPHATE 10 MG/ML IJ SOLN
INTRAMUSCULAR | Status: AC
  Filled 2020-02-15: qty 1

## 2020-02-15 MED ORDER — BUPIVACAINE HCL (PF) 0.25 % IJ SOLN
INTRAMUSCULAR | Status: DC | PRN
  Administered 2020-02-15: 10 mL

## 2020-02-15 MED ORDER — 0.9 % SODIUM CHLORIDE (POUR BTL) OPTIME
TOPICAL | Status: DC | PRN
  Administered 2020-02-15: 1000 mL

## 2020-02-15 MED ORDER — EPHEDRINE SULFATE 50 MG/ML IJ SOLN
INTRAMUSCULAR | Status: DC | PRN
Start: 2020-02-15 — End: 2020-02-15
  Administered 2020-02-15 (×2): 10 mg via INTRAVENOUS
  Administered 2020-02-15: 5 mg via INTRAVENOUS
  Administered 2020-02-15: 10 mg via INTRAVENOUS
  Administered 2020-02-15: 5 mg via INTRAVENOUS

## 2020-02-15 MED ORDER — PROPOFOL 10 MG/ML IV BOLUS
INTRAVENOUS | Status: DC | PRN
  Administered 2020-02-15: 150 mg via INTRAVENOUS
  Administered 2020-02-15 (×5): 10 mg via INTRAVENOUS

## 2020-02-15 MED ORDER — ONDANSETRON HCL 4 MG/2ML IJ SOLN
INTRAMUSCULAR | Status: AC
  Filled 2020-02-15: qty 2

## 2020-02-15 MED ORDER — CLINDAMYCIN PHOSPHATE 900 MG/50ML IV SOLN
INTRAVENOUS | Status: AC
  Filled 2020-02-15: qty 50

## 2020-02-15 MED ORDER — CHLORHEXIDINE GLUCONATE CLOTH 2 % EX PADS: 6.0000 | MEDICATED_PAD | Freq: Once | CUTANEOUS | Status: DC

## 2020-02-15 MED ORDER — BUPIVACAINE HCL (PF) 0.25 % IJ SOLN
INTRAMUSCULAR | Status: AC
  Filled 2020-02-15: qty 30

## 2020-02-15 MED ORDER — PHENYLEPHRINE 40 MCG/ML (10ML) SYRINGE FOR IV PUSH (FOR BLOOD PRESSURE SUPPORT)
PREFILLED_SYRINGE | INTRAVENOUS | Status: AC
  Filled 2020-02-15: qty 10

## 2020-02-15 MED ORDER — LIDOCAINE 2% (20 MG/ML) 5 ML SYRINGE
INTRAMUSCULAR | Status: DC | PRN
  Administered 2020-02-15: 100 mg via INTRAVENOUS

## 2020-02-15 MED ORDER — AMISULPRIDE (ANTIEMETIC) 5 MG/2ML IV SOLN
5.0000 mg | Freq: Once | INTRAVENOUS | Status: AC
  Administered 2020-02-15: 5 mg via INTRAVENOUS

## 2020-02-15 MED ORDER — DEXAMETHASONE SODIUM PHOSPHATE 4 MG/ML IJ SOLN
INTRAMUSCULAR | Status: DC | PRN
  Administered 2020-02-15: 4 mg via INTRAVENOUS

## 2020-02-15 MED ORDER — SODIUM CHLORIDE 0.9 % IV SOLN
INTRAVENOUS | Status: AC
  Filled 2020-02-15: qty 1.2

## 2020-02-15 MED ORDER — CHLORHEXIDINE GLUCONATE 0.12 % MT SOLN: 15.0000 mL | Freq: Once | OROMUCOSAL | Status: AC

## 2020-02-15 MED ORDER — HYDROMORPHONE HCL 1 MG/ML IJ SOLN: 0.2500 mg | INTRAMUSCULAR | Status: DC | PRN

## 2020-02-15 MED ORDER — PHENYLEPHRINE HCL (PRESSORS) 10 MG/ML IV SOLN
INTRAVENOUS | Status: DC | PRN
Start: 2020-02-15 — End: 2020-02-15
  Administered 2020-02-15: 120 ug via INTRAVENOUS
  Administered 2020-02-15 (×2): 80 ug via INTRAVENOUS

## 2020-02-15 MED ORDER — FENTANYL CITRATE (PF) 250 MCG/5ML IJ SOLN
INTRAMUSCULAR | Status: AC
  Filled 2020-02-15: qty 5

## 2020-02-15 MED ORDER — HEPARIN SOD (PORK) LOCK FLUSH 100 UNIT/ML IV SOLN
INTRAVENOUS | Status: AC
  Filled 2020-02-15: qty 5

## 2020-02-15 MED ORDER — HEMOSTATIC AGENTS (NO CHARGE) OPTIME
TOPICAL | Status: DC | PRN
  Administered 2020-02-15: 1 via TOPICAL

## 2020-02-15 MED ORDER — PROPOFOL 10 MG/ML IV BOLUS
INTRAVENOUS | Status: AC
  Filled 2020-02-15: qty 20

## 2020-02-15 MED ORDER — AMISULPRIDE (ANTIEMETIC) 5 MG/2ML IV SOLN
INTRAVENOUS | Status: AC
  Filled 2020-02-15: qty 2

## 2020-02-15 MED ORDER — LACTATED RINGERS IV SOLN: INTRAVENOUS | Status: DC | PRN

## 2020-02-15 MED ORDER — OXYCODONE HCL 5 MG PO TABS: 5.0000 mg | ORAL_TABLET | Freq: Four times a day (QID) | ORAL | 0 refills | Status: DC | PRN

## 2020-02-15 MED ORDER — CLINDAMYCIN PHOSPHATE 900 MG/50ML IV SOLN
900.0000 mg | INTRAVENOUS | Status: AC
  Administered 2020-02-15: 900 mg via INTRAVENOUS

## 2020-02-15 MED ORDER — MEPERIDINE HCL 25 MG/ML IJ SOLN: 6.2500 mg | INTRAMUSCULAR | Status: DC | PRN

## 2020-02-15 MED ORDER — ONDANSETRON HCL 4 MG/2ML IJ SOLN
INTRAMUSCULAR | Status: DC | PRN
  Administered 2020-02-15: 4 mg via INTRAVENOUS

## 2020-02-15 MED ORDER — FENTANYL CITRATE (PF) 100 MCG/2ML IJ SOLN
INTRAMUSCULAR | Status: DC | PRN
  Administered 2020-02-15: 50 ug via INTRAVENOUS

## 2020-02-15 MED ORDER — ONDANSETRON HCL 4 MG/2ML IJ SOLN
4.0000 mg | Freq: Once | INTRAMUSCULAR | Status: AC | PRN
  Administered 2020-02-15: 4 mg via INTRAVENOUS

## 2020-02-15 MED ORDER — HEPARIN SOD (PORK) LOCK FLUSH 100 UNIT/ML IV SOLN
INTRAVENOUS | Status: DC | PRN
  Administered 2020-02-15: 500 [IU] via INTRAVENOUS

## 2020-02-15 MED ORDER — CHLORHEXIDINE GLUCONATE 0.12 % MT SOLN
OROMUCOSAL | Status: AC
  Administered 2020-02-15: 15 mL via OROMUCOSAL
  Filled 2020-02-15: qty 15

## 2020-02-15 MED ORDER — STERILE WATER FOR IRRIGATION IR SOLN
Status: DC | PRN
  Administered 2020-02-15: 1000 mL

## 2020-02-15 MED ORDER — ORAL CARE MOUTH RINSE: 15.0000 mL | Freq: Once | OROMUCOSAL | Status: AC

## 2020-02-15 MED ORDER — SODIUM CHLORIDE 0.9 % IV SOLN
INTRAVENOUS | Status: DC | PRN
  Administered 2020-02-15: 60 mL

## 2020-02-15 SURGICAL SUPPLY — 38 items
ADH SKN CLS APL DERMABOND .7 (GAUZE/BANDAGES/DRESSINGS) ×1
APL PRP STRL LF DISP 70% ISPRP (MISCELLANEOUS) ×1
BAG DECANTER FOR FLEXI CONT (MISCELLANEOUS) ×2 IMPLANT
CHLORAPREP W/TINT 26 (MISCELLANEOUS) ×2 IMPLANT
COVER SURGICAL LIGHT HANDLE (MISCELLANEOUS) ×2 IMPLANT
COVER TRANSDUCER ULTRASND GEL (DISPOSABLE) ×2 IMPLANT
DERMABOND ADVANCED (GAUZE/BANDAGES/DRESSINGS) ×1
DERMABOND ADVANCED .7 DNX12 (GAUZE/BANDAGES/DRESSINGS) ×1 IMPLANT
DRAPE C-ARM 42X120 X-RAY (DRAPES) ×2 IMPLANT
DRSG TEGADERM 4X4.75 (GAUZE/BANDAGES/DRESSINGS) ×1 IMPLANT
ELECT CAUTERY BLADE 6.4 (BLADE) ×2 IMPLANT
ELECT REM PT RETURN 9FT ADLT (ELECTROSURGICAL) ×2
ELECTRODE REM PT RTRN 9FT ADLT (ELECTROSURGICAL) ×1 IMPLANT
GAUZE 4X4 16PLY RFD (DISPOSABLE) ×2 IMPLANT
GAUZE SPONGE 4X4 12PLY STRL (GAUZE/BANDAGES/DRESSINGS) ×1 IMPLANT
GEL ULTRASOUND 20GR AQUASONIC (MISCELLANEOUS) ×1 IMPLANT
GLOVE BIO SURGEON STRL SZ8 (GLOVE) ×2 IMPLANT
GLOVE BIOGEL PI IND STRL 8 (GLOVE) ×1 IMPLANT
GLOVE BIOGEL PI INDICATOR 8 (GLOVE) ×1
GOWN STRL REUS W/ TWL LRG LVL3 (GOWN DISPOSABLE) ×1 IMPLANT
GOWN STRL REUS W/ TWL XL LVL3 (GOWN DISPOSABLE) ×1 IMPLANT
GOWN STRL REUS W/TWL LRG LVL3 (GOWN DISPOSABLE) ×2
GOWN STRL REUS W/TWL XL LVL3 (GOWN DISPOSABLE) ×2
HEMOSTAT SNOW SURGICEL 2X4 (HEMOSTASIS) ×1 IMPLANT
KIT BASIN OR (CUSTOM PROCEDURE TRAY) ×2 IMPLANT
KIT PORT POWER 8FR ISP CVUE (Port) ×1 IMPLANT
KIT TURNOVER KIT B (KITS) ×2 IMPLANT
NS IRRIG 1000ML POUR BTL (IV SOLUTION) ×2 IMPLANT
PAD ARMBOARD 7.5X6 YLW CONV (MISCELLANEOUS) ×2 IMPLANT
PENCIL BUTTON HOLSTER BLD 10FT (ELECTRODE) ×2 IMPLANT
POSITIONER HEAD DONUT 9IN (MISCELLANEOUS) ×2 IMPLANT
SUT MNCRL AB 4-0 PS2 18 (SUTURE) ×2 IMPLANT
SUT PROLENE 2 0 SH 30 (SUTURE) ×2 IMPLANT
SUT VIC AB 3-0 SH 27 (SUTURE) ×2
SUT VIC AB 3-0 SH 27X BRD (SUTURE) ×1 IMPLANT
SYR 5ML LUER SLIP (SYRINGE) ×2 IMPLANT
TOWEL GREEN STERILE FF (TOWEL DISPOSABLE) ×2 IMPLANT
TRAY LAPAROSCOPIC MC (CUSTOM PROCEDURE TRAY) ×2 IMPLANT

## 2020-02-15 NOTE — OR Nursing (Signed)
Port left accessed by Dr. Brantley Stage

## 2020-02-15 NOTE — Anesthesia Preprocedure Evaluation (Signed)
Anesthesia Evaluation  Patient identified by MRN, date of birth, ID band Patient awake    Reviewed: Allergy & Precautions, NPO status , Patient's Chart, lab work & pertinent test results  History of Anesthesia Complications (+) PONV  Airway Mallampati: I  TM Distance: >3 FB Neck ROM: Full    Dental   Pulmonary former smoker,    Pulmonary exam normal        Cardiovascular hypertension, Pt. on medications Normal cardiovascular exam     Neuro/Psych    GI/Hepatic   Endo/Other    Renal/GU      Musculoskeletal   Abdominal   Peds  Hematology   Anesthesia Other Findings   Reproductive/Obstetrics                             Anesthesia Physical Anesthesia Plan  ASA: III  Anesthesia Plan: General   Post-op Pain Management:    Induction: Intravenous  PONV Risk Score and Plan: 4 or greater and Midazolam and Ondansetron  Airway Management Planned: LMA  Additional Equipment:   Intra-op Plan:   Post-operative Plan: Extubation in OR  Informed Consent: I have reviewed the patients History and Physical, chart, labs and discussed the procedure including the risks, benefits and alternatives for the proposed anesthesia with the patient or authorized representative who has indicated his/her understanding and acceptance.       Plan Discussed with: CRNA and Surgeon  Anesthesia Plan Comments:         Anesthesia Quick Evaluation

## 2020-02-15 NOTE — Discharge Instructions (Signed)
    PORT-A-CATH: POST OP INSTRUCTIONS  Always review your discharge instruction sheet given to you by the facility where your surgery was performed.   1. A prescription for pain medication may be given to you upon discharge. Take your pain medication as prescribed, if needed. If narcotic pain medicine is not needed, then you make take acetaminophen (Tylenol) or ibuprofen (Advil) as needed.  2. Take your usually prescribed medications unless otherwise directed. 3. If you need a refill on your pain medication, please contact our office. All narcotic pain medicine now requires a paper prescription.  Phoned in and fax refills are no longer allowed by law.  Prescriptions will not be filled after 5 pm or on weekends.  4. You should follow a light diet for the remainder of the day after your procedure. 5. Most patients will experience some mild swelling and/or bruising in the area of the incision. It may take several days to resolve. 6. It is common to experience some constipation if taking pain medication after surgery. Increasing fluid intake and taking a stool softener (such as Colace) will usually help or prevent this problem from occurring. A mild laxative (Milk of Magnesia or Miralax) should be taken according to package directions if there are no bowel movements after 48 hours.  7. Unless discharge instructions indicate otherwise, you may remove your bandages 48 hours after surgery, and you may shower at that time. You may have steri-strips (small white skin tapes) in place directly over the incision.  These strips should be left on the skin for 7-10 days.  If your surgeon used Dermabond (skin glue) on the incision, you may shower in 24 hours.  The glue will flake off over the next 2-3 weeks.  8. If your port is left accessed at the end of surgery (needle left in port), the dressing cannot get wet and should only by changed by a healthcare professional. When the port is no longer accessed (when the  needle has been removed), follow step 7.   9. ACTIVITIES:  Limit activity involving your arms for the next 72 hours. Do no strenuous exercise or activity for 1 week. You may drive when you are no longer taking prescription pain medication, you can comfortably wear a seatbelt, and you can maneuver your car. 10.You may need to see your doctor in the office for a follow-up appointment.  Please       check with your doctor.  11.When you receive a new Port-a-Cath, you will get a product guide and        ID card.  Please keep them in case you need them.  WHEN TO CALL YOUR DOCTOR ([phone]): 1. Fever over 101.0 2. Chills 3. Continued bleeding from incision 4. Increased redness and tenderness at the site 5. Shortness of breath, difficulty breathing   The clinic staff is available to answer your questions during regular business hours. Please don't hesitate to call and ask to speak to one of the nurses or medical assistants for clinical concerns. If you have a medical emergency, go to the nearest emergency room or call 911.  A surgeon from [facility] is always on call at the hospital.     For further information, please visit [email or web address]      

## 2020-02-15 NOTE — Interval H&P Note (Signed)
History and Physical Interval Note:  02/15/2020 8:41 AM  Rachel Valentine  has presented today for surgery, with the diagnosis of [place].  The various methods of treatment have been discussed with the patient and family. After consideration of risks, benefits and other options for treatment, the patient has consented to  Procedure(s): INSERTION PORT-A-CATH WITH ULTRASOUND GUIDANCE (N/A) as a surgical intervention.  The patient's history has been reviewed, patient examined, no change in status, stable for surgery.  I have reviewed the patient's chart and labs.  Questions were answered to the patient's satisfaction.     [place]

## 2020-02-15 NOTE — Anesthesia Procedure Notes (Signed)
Procedure Name: LMA Insertion Date/Time: 02/15/2020 9:11 AM Performed by: Eulas Post, [place] Kievit W, CRNA Pre-anesthesia Checklist: Patient identified, Emergency Drugs available, Suction available and Patient being monitored Patient Re-evaluated:Patient Re-evaluated prior to induction Oxygen Delivery Method: Circle system utilized Preoxygenation: Pre-oxygenation with 100% oxygen Induction Type: IV induction Ventilation: Mask ventilation without difficulty LMA: LMA inserted LMA Size: 3.0 Number of attempts: 1 Airway Equipment and Method: Bite block Placement Confirmation: positive ETCO2 Tube secured with: Tape Dental Injury: Teeth and Oropharynx as per pre-operative assessment  Comments: Placed by D. [place], [place]

## 2020-02-15 NOTE — Anesthesia Postprocedure Evaluation (Signed)
Anesthesia Post Note  Patient: Quetzali Heinle Mutz  Procedure(s) Performed: INSERTION PORT-A-CATH WITH ULTRASOUND GUIDANCE (Right Chest)     Patient location during evaluation: PACU Anesthesia Type: General Level of consciousness: awake and alert Pain management: pain level controlled Vital Signs Assessment: post-procedure vital signs reviewed and stable Respiratory status: spontaneous breathing, nonlabored ventilation, respiratory function stable and patient connected to nasal cannula oxygen Cardiovascular status: blood pressure returned to baseline and stable Postop Assessment: no apparent nausea or vomiting Anesthetic complications: no   No complications documented.  Last Vitals:  Vitals:   02/15/20 1100 02/15/20 1200  BP: 135/67 (!) 143/76  Pulse: 83 88  Resp: 13 17  Temp: (!) 36.3 C 36.4 C  SpO2: 100% 99%    Last Pain:  Vitals:   02/15/20 1145  PainSc: 0-No pain                 Eamon Tantillo DAVID

## 2020-02-15 NOTE — Transfer of Care (Signed)
Immediate Anesthesia Transfer of Care Note  [name]  Procedure(s) Performed: INSERTION PORT-A-CATH WITH ULTRASOUND GUIDANCE (Right Chest)  Patient Location: PACU  Anesthesia Type:General  Level of Consciousness: drowsy  Airway & Oxygen Therapy: Patient Spontanous Breathing and Patient connected to face mask oxygen  Post-op Assessment: Report given to RN and Post -op Vital signs reviewed and stable  Post vital signs: Reviewed and stable  Last Vitals:  Vitals Value Taken Time  BP 128/62 02/15/20 1018  Temp    Pulse 89 02/15/20 1018  Resp 16 02/15/20 1018  SpO2 100 % 02/15/20 1018  Vitals shown include unvalidated device data.  Last Pain:  Vitals:   02/15/20 0741  PainSc: 0-No pain         Complications: No complications documented.

## 2020-02-15 NOTE — Op Note (Signed)
Preoperative diagnosis: PAC needed for chemotherapy  Postoperative diagnosis: Same  Procedure: Portacath Placement wit C arm and U/S guidance   Surgeon: Turner Daniels, MD, FACS  Anesthesia: General and 0.25 % marcaine with epinephrine  Clinical History and Indications: The patient is getting ready to begin chemotherapy for her cancer. She  needs a Port-A-Cath for venous access. Risk of bleeding, infection,  Collapse lung,  Death,  DVT,  Organ injury,  Mediastinal injury,  Injury to heart,  Injury to blood vessels,  Nerves,  Migration of catheter,  Embolization of catheter and the need for more surgery.  Description of Procedure: I have seen the patient in the holding area and confirmed the plans for the procedure as noted above. I reviewed the risks and complications again and the patient has no further questions. She wishes to proceed.   The patient was then taken to the operating room. After satisfactory general  anesthesia had been obtained the upper chest and lower neck were prepped and draped as a sterile field. The timeout was done.  The right internal jugular vein  was entered under U/S guidance  and the guidewire threaded into the superior vena cava right atrial area under fluoroscopic guidance. An incision was then made on the anterior chest wall and a subcutaneous pocket fashioned for the port reservoir.  The port tubing was then brought through a subcutaneous tunnel from the port site to the guidewire site.  The port and catheter were attached, locked  and flushed. The catheter was measured and cut to appropriate length.The dilator and peel-away sheath were then advanced over the guidewire while monitoring this with fluoroscopy. The guidewire and dilator were removed and the tubing threaded to approximately 21 cm. The peel-away sheath was then removed. The catheter aspirated and flushed easily. Using fluoroscopy the tip was in the superior vena cava right atrial junction area. It  aspirated and flushed easily. That aspirated and flushed easily.  The reservoir was secured to the fascia with 1 sutures of 2-0 Prolene. A final check with fluoroscopy was done to make sure we had no kinks and good positioning of the tip of the catheter. Everything appeared to be okay. The catheter was aspirated, flushed with dilute heparin and then concentrated aqueous heparin.  The incision was then closed with interrupted 3-0 Vicryl, and 4-0 Monocryl subcuticular with Dermabond on the skin. Port flushed and left accessed.   There were no operative complications. Estimated blood loss was minimal. All counts were correct. The patient tolerated the procedure well.  Turner Daniels, MD, FACS

## 2020-02-16 ENCOUNTER — Inpatient Hospital Stay: Payer: Medicare HMO

## 2020-02-16 ENCOUNTER — Other Ambulatory Visit: Payer: Self-pay | Admitting: *Deleted

## 2020-02-16 ENCOUNTER — Encounter: Payer: Self-pay | Admitting: *Deleted

## 2020-02-16 ENCOUNTER — Other Ambulatory Visit: Payer: Self-pay

## 2020-02-16 ENCOUNTER — Other Ambulatory Visit: Payer: Medicare HMO

## 2020-02-16 ENCOUNTER — Encounter: Payer: Self-pay | Admitting: Hematology and Oncology

## 2020-02-16 ENCOUNTER — Encounter (HOSPITAL_COMMUNITY): Payer: Self-pay | Admitting: Surgery

## 2020-02-16 ENCOUNTER — Inpatient Hospital Stay: Payer: Medicare HMO | Admitting: *Deleted

## 2020-02-16 VITALS — Ht 61.5 in | Wt 109.3 lb

## 2020-02-16 VITALS — BP 153/68 | HR 86 | Temp 97.8°F | Resp 18

## 2020-02-16 DIAGNOSIS — C50512 Malignant neoplasm of lower-outer quadrant of left female breast: Secondary | ICD-10-CM

## 2020-02-16 DIAGNOSIS — Z95828 Presence of other vascular implants and grafts: Secondary | ICD-10-CM

## 2020-02-16 DIAGNOSIS — Z170 Estrogen receptor positive status [ER+]: Secondary | ICD-10-CM

## 2020-02-16 DIAGNOSIS — Z006 Encounter for examination for normal comparison and control in clinical research program: Secondary | ICD-10-CM

## 2020-02-16 DIAGNOSIS — Z79899 Other long term (current) drug therapy: Secondary | ICD-10-CM | POA: Diagnosis not present

## 2020-02-16 DIAGNOSIS — Z5111 Encounter for antineoplastic chemotherapy: Secondary | ICD-10-CM | POA: Diagnosis not present

## 2020-02-16 LAB — CBC WITH DIFFERENTIAL (CANCER CENTER ONLY)
Abs Immature Granulocytes: 0.04 10*3/uL (ref 0.00–0.07)
Basophils Absolute: 0 10*3/uL (ref 0.0–0.1)
Basophils Relative: 0 %
Eosinophils Absolute: 0.1 10*3/uL (ref 0.0–0.5)
Eosinophils Relative: 0 %
HCT: 38.7 % (ref 36.0–46.0)
Hemoglobin: 12.5 g/dL (ref 12.0–15.0)
Immature Granulocytes: 0 %
Lymphocytes Relative: 11 %
Lymphs Abs: 1.5 10*3/uL (ref 0.7–4.0)
MCH: 30.3 pg (ref 26.0–34.0)
MCHC: 32.3 g/dL (ref 30.0–36.0)
MCV: 93.7 fL (ref 80.0–100.0)
Monocytes Absolute: 1.2 10*3/uL — ABNORMAL HIGH (ref 0.1–1.0)
Monocytes Relative: 8 %
Neutro Abs: 11.1 10*3/uL — ABNORMAL HIGH (ref 1.7–7.7)
Neutrophils Relative %: 81 %
Platelet Count: 302 10*3/uL (ref 150–400)
RBC: 4.13 MIL/uL (ref 3.87–5.11)
RDW: 13.3 % (ref 11.5–15.5)
WBC Count: 13.9 10*3/uL — ABNORMAL HIGH (ref 4.0–10.5)
nRBC: 0 % (ref 0.0–0.2)

## 2020-02-16 LAB — CMP (CANCER CENTER ONLY)
ALT: 9 U/L (ref 0–44)
AST: 18 U/L (ref 15–41)
Albumin: 4.3 g/dL (ref 3.5–5.0)
Alkaline Phosphatase: 69 U/L (ref 38–126)
Anion gap: 10 (ref 5–15)
BUN: 16 mg/dL (ref 8–23)
CO2: 25 mmol/L (ref 22–32)
Calcium: 9.6 mg/dL (ref 8.9–10.3)
Chloride: 107 mmol/L (ref 98–111)
Creatinine: 0.82 mg/dL (ref 0.44–1.00)
GFR, Est AFR Am: >60 mL/min (ref >60)
GFR, Estimated: >60 mL/min (ref >60)
Glucose, Bld: 112 mg/dL — ABNORMAL HIGH (ref 70–99)
Potassium: 3.9 mmol/L (ref 3.5–5.1)
Sodium: 142 mmol/L (ref 135–145)
Total Bilirubin: 0.4 mg/dL (ref 0.3–1.2)
Total Protein: 7 g/dL (ref 6.5–8.1)

## 2020-02-16 LAB — RESEARCH LABS

## 2020-02-16 MED ORDER — DIPHENHYDRAMINE HCL 50 MG/ML IJ SOLN
25.0000 mg | Freq: Once | INTRAMUSCULAR | Status: AC
  Administered 2020-02-16: 25 mg via INTRAVENOUS

## 2020-02-16 MED ORDER — HEPARIN SOD (PORK) LOCK FLUSH 100 UNIT/ML IV SOLN
500.0000 [IU] | Freq: Once | INTRAVENOUS | Status: AC | PRN
  Administered 2020-02-16: 500 [IU]
  Filled 2020-02-16: qty 5

## 2020-02-16 MED ORDER — FAMOTIDINE IN NACL 20-0.9 MG/50ML-% IV SOLN
20.0000 mg | Freq: Once | INTRAVENOUS | Status: AC
  Administered 2020-02-16: 20 mg via INTRAVENOUS

## 2020-02-16 MED ORDER — ACETAMINOPHEN 325 MG PO TABS
650.0000 mg | ORAL_TABLET | Freq: Once | ORAL | Status: AC
  Administered 2020-02-16: 650 mg via ORAL

## 2020-02-16 MED ORDER — SODIUM CHLORIDE 0.9 % IV SOLN
80.0000 mg/m2 | Freq: Once | INTRAVENOUS | Status: AC
  Administered 2020-02-16: 120 mg via INTRAVENOUS
  Filled 2020-02-16: qty 20

## 2020-02-16 MED ORDER — SODIUM CHLORIDE 0.9 % IV SOLN
Freq: Once | INTRAVENOUS | Status: AC
  Filled 2020-02-16: qty 250

## 2020-02-16 MED ORDER — SODIUM CHLORIDE 0.9% FLUSH
10.0000 mL | INTRAVENOUS | Status: DC | PRN
  Administered 2020-02-16: 10 mL via INTRAVENOUS
  Filled 2020-02-16: qty 10

## 2020-02-16 MED ORDER — DIPHENHYDRAMINE HCL 50 MG/ML IJ SOLN
INTRAMUSCULAR | Status: AC
  Filled 2020-02-16: qty 1

## 2020-02-16 MED ORDER — SODIUM CHLORIDE 0.9% FLUSH
10.0000 mL | INTRAVENOUS | Status: DC | PRN
  Administered 2020-02-16 (×2): 10 mL
  Filled 2020-02-16: qty 10

## 2020-02-16 MED ORDER — FAMOTIDINE IN NACL 20-0.9 MG/50ML-% IV SOLN
INTRAVENOUS | Status: AC
  Filled 2020-02-16: qty 50

## 2020-02-16 MED ORDER — SODIUM CHLORIDE 0.9 % IV SOLN
10.0000 mg | Freq: Once | INTRAVENOUS | Status: AC
  Administered 2020-02-16: 10 mg via INTRAVENOUS
  Filled 2020-02-16: qty 10

## 2020-02-16 MED ORDER — ACETAMINOPHEN 325 MG PO TABS
ORAL_TABLET | ORAL | Status: AC
  Filled 2020-02-16: qty 2

## 2020-02-16 MED ORDER — TRASTUZUMAB-ANNS CHEMO 150 MG IV SOLR
4.0000 mg/kg | Freq: Once | INTRAVENOUS | Status: AC
  Administered 2020-02-16: 189 mg via INTRAVENOUS
  Filled 2020-02-16: qty 9

## 2020-02-16 NOTE — Patient Instructions (Addendum)
[facility] Discharge Instructions for Patients Receiving Chemotherapy  Today you received the following chemotherapy agents Trastuzumab Calla Kicks) & Paclitaxel (TAXOL).  To help prevent nausea and vomiting after your treatment, we encourage you to take your nausea medication as prescribed.   If you develop nausea and vomiting that is not controlled by your nausea medication, call the clinic.   BELOW ARE SYMPTOMS THAT SHOULD BE REPORTED IMMEDIATELY:  *FEVER GREATER THAN 100.5 F  *CHILLS WITH OR WITHOUT FEVER  NAUSEA AND VOMITING THAT IS NOT CONTROLLED WITH YOUR NAUSEA MEDICATION  *UNUSUAL SHORTNESS OF BREATH  *UNUSUAL BRUISING OR BLEEDING  TENDERNESS IN MOUTH AND THROAT WITH OR WITHOUT PRESENCE OF ULCERS  *URINARY PROBLEMS  *BOWEL PROBLEMS  UNUSUAL RASH Items with * indicate a potential emergency and should be followed up as soon as possible.  Feel free to call the clinic should you have any questions or concerns. The clinic phone number is [phone].  Please show the [facility] at check-in to the Emergency Department and triage nurse.  Trastuzumab injection for infusion What is this medicine? TRASTUZUMAB (tras TOO zoo mab) is a monoclonal antibody. It is used to treat breast cancer and stomach cancer. This medicine may be used for other purposes; ask your health care provider or pharmacist if you have questions. COMMON BRAND NAME(S): Herceptin, Galvin Proffer, Trazimera What should I tell my health care provider before I take this medicine? They need to know if you have any of these conditions:  heart disease  heart failure  lung or breathing disease, like asthma  an unusual or allergic reaction to trastuzumab, benzyl alcohol, or other medications, foods, dyes, or preservatives  pregnant or trying to get pregnant  breast-feeding How should I use this medicine? This drug is given as an infusion into a vein. It is  administered in a hospital or clinic by a specially trained health care professional. Talk to your pediatrician regarding the use of this medicine in children. This medicine is not approved for use in children. Overdosage: If you think you have taken too much of this medicine contact a poison control center or emergency room at once. NOTE: This medicine is only for you. Do not share this medicine with others. What if I miss a dose? It is important not to miss a dose. Call your doctor or health care professional if you are unable to keep an appointment. What may interact with this medicine? This medicine may interact with the following medications:  certain types of chemotherapy, such as daunorubicin, doxorubicin, epirubicin, and idarubicin This list may not describe all possible interactions. Give your health care provider a list of all the medicines, herbs, non-prescription drugs, or dietary supplements you use. Also tell them if you smoke, drink alcohol, or use illegal drugs. Some items may interact with your medicine. What should I watch for while using this medicine? Visit your doctor for checks on your progress. Report any side effects. Continue your course of treatment even though you feel ill unless your doctor tells you to stop. Call your doctor or health care professional for advice if you get a fever, chills or sore throat, or other symptoms of a cold or flu. Do not treat yourself. Try to avoid being around people who are sick. You may experience fever, chills and shaking during your first infusion. These effects are usually mild and can be treated with other medicines. Report any side effects during the infusion to your health care  professional. Fever and chills usually do not happen with later infusions. Do not become pregnant while taking this medicine or for 7 months after stopping it. Women should inform their doctor if they wish to become pregnant or think they might be pregnant. Women  of child-bearing potential will need to have a negative pregnancy test before starting this medicine. There is a potential for serious side effects to an unborn child. Talk to your health care professional or pharmacist for more information. Do not breast-feed an infant while taking this medicine or for 7 months after stopping it. Women must use effective birth control with this medicine. What side effects may I notice from receiving this medicine? Side effects that you should report to your doctor or health care professional as soon as possible:  allergic reactions like skin rash, itching or hives, swelling of the face, lips, or tongue  chest pain or palpitations  cough  dizziness  feeling faint or lightheaded, falls  fever  general ill feeling or flu-like symptoms  signs of worsening heart failure like breathing problems; swelling in your legs and feet  unusually weak or tired Side effects that usually do not require medical attention (report to your doctor or health care professional if they continue or are bothersome):  bone pain  changes in taste  diarrhea  joint pain  nausea/vomiting  weight loss This list may not describe all possible side effects. Call your doctor for medical advice about side effects. You may report side effects to FDA at 1-800-FDA-1088. Where should I keep my medicine? This drug is given in a hospital or clinic and will not be stored at home. NOTE: This sheet is a summary. It may not cover all possible information. If you have questions about this medicine, talk to your doctor, pharmacist, or health care provider.  2020 Elsevier/Gold Standard (2016-08-13 14:37:52)  Paclitaxel injection What is this medicine? PACLITAXEL (PAK li TAX el) is a chemotherapy drug. It targets fast dividing cells, like cancer cells, and causes these cells to die. This medicine is used to treat ovarian cancer, breast cancer, lung cancer, Kaposi's sarcoma, and other  cancers. This medicine may be used for other purposes; ask your health care provider or pharmacist if you have questions. COMMON BRAND NAME(S): Onxol, Taxol What should I tell my health care provider before I take this medicine? They need to know if you have any of these conditions:  history of irregular heartbeat  liver disease  low blood counts, like low white cell, platelet, or red cell counts  lung or breathing disease, like asthma  tingling of the fingers or toes, or other nerve disorder  an unusual or allergic reaction to paclitaxel, alcohol, polyoxyethylated castor oil, other chemotherapy, other medicines, foods, dyes, or preservatives  pregnant or trying to get pregnant  breast-feeding How should I use this medicine? This drug is given as an infusion into a vein. It is administered in a hospital or clinic by a specially trained health care professional. Talk to your pediatrician regarding the use of this medicine in children. Special care may be needed. Overdosage: If you think you have taken too much of this medicine contact a poison control center or emergency room at once. NOTE: This medicine is only for you. Do not share this medicine with others. What if I miss a dose? It is important not to miss your dose. Call your doctor or health care professional if you are unable to keep an appointment. What may interact with  this medicine? Do not take this medicine with any of the following medications:  disulfiram  metronidazole This medicine may also interact with the following medications:  antiviral medicines for hepatitis, HIV or AIDS  certain antibiotics like erythromycin and clarithromycin  certain medicines for fungal infections like ketoconazole and itraconazole  certain medicines for seizures like carbamazepine, phenobarbital, phenytoin  gemfibrozil  nefazodone  rifampin  St. John's wort This list may not describe all possible interactions. Give your  health care provider a list of all the medicines, herbs, non-prescription drugs, or dietary supplements you use. Also tell them if you smoke, drink alcohol, or use illegal drugs. Some items may interact with your medicine. What should I watch for while using this medicine? Your condition will be monitored carefully while you are receiving this medicine. You will need important blood work done while you are taking this medicine. This medicine can cause serious allergic reactions. To reduce your risk you will need to take other medicine(s) before treatment with this medicine. If you experience allergic reactions like skin rash, itching or hives, swelling of the face, lips, or tongue, tell your doctor or health care professional right away. In some cases, you may be given additional medicines to help with side effects. Follow all directions for their use. This drug may make you feel generally unwell. This is not uncommon, as chemotherapy can affect healthy cells as well as cancer cells. Report any side effects. Continue your course of treatment even though you feel ill unless your doctor tells you to stop. Call your doctor or health care professional for advice if you get a fever, chills or sore throat, or other symptoms of a cold or flu. Do not treat yourself. This drug decreases your body's ability to fight infections. Try to avoid being around people who are sick. This medicine may increase your risk to bruise or bleed. Call your doctor or health care professional if you notice any unusual bleeding. Be careful brushing and flossing your teeth or using a toothpick because you may get an infection or bleed more easily. If you have any dental work done, tell your dentist you are receiving this medicine. Avoid taking products that contain aspirin, acetaminophen, ibuprofen, naproxen, or ketoprofen unless instructed by your doctor. These medicines may hide a fever. Do not become pregnant while taking this  medicine. Women should inform their doctor if they wish to become pregnant or think they might be pregnant. There is a potential for serious side effects to an unborn child. Talk to your health care professional or pharmacist for more information. Do not breast-feed an infant while taking this medicine. Men are advised not to father a child while receiving this medicine. This product may contain alcohol. Ask your pharmacist or healthcare provider if this medicine contains alcohol. Be sure to tell all healthcare providers you are taking this medicine. Certain medicines, like metronidazole and disulfiram, can cause an unpleasant reaction when taken with alcohol. The reaction includes flushing, headache, nausea, vomiting, sweating, and increased thirst. The reaction can last from 30 minutes to several hours. What side effects may I notice from receiving this medicine? Side effects that you should report to your doctor or health care professional as soon as possible:  allergic reactions like skin rash, itching or hives, swelling of the face, lips, or tongue  breathing problems  changes in vision  fast, irregular heartbeat  high or low blood pressure  mouth sores  pain, tingling, numbness in the hands or feet  signs of decreased platelets or bleeding - bruising, pinpoint red spots on the skin, black, tarry stools, blood in the urine  signs of decreased red blood cells - unusually weak or tired, feeling faint or lightheaded, falls  signs of infection - fever or chills, cough, sore throat, pain or difficulty passing urine  signs and symptoms of liver injury like dark yellow or brown urine; general ill feeling or flu-like symptoms; light-colored stools; loss of appetite; nausea; right upper belly pain; unusually weak or tired; yellowing of the eyes or skin  swelling of the ankles, feet, hands  unusually slow heartbeat Side effects that usually do not require medical attention (report to your  doctor or health care professional if they continue or are bothersome):  diarrhea  hair loss  loss of appetite  muscle or joint pain  nausea, vomiting  pain, redness, or irritation at site where injected  tiredness This list may not describe all possible side effects. Call your doctor for medical advice about side effects. You may report side effects to FDA at 1-800-FDA-1088. Where should I keep my medicine? This drug is given in a hospital or clinic and will not be stored at home. NOTE: This sheet is a summary. It may not cover all possible information. If you have questions about this medicine, talk to your doctor, pharmacist, or health care provider.  2020 Elsevier/Gold Standard (2017-04-22 13:14:55)

## 2020-02-16 NOTE — Research (Signed)
B4496, A PROSPECTIVE OBSERVATIONAL COHORT STUDY TO DEVELOP A PREDICTIVE MODEL OF TAXANE-INDUCED PERIPHERAL NEUROPATHY IN CANCER PATIENTS. BASELINE ASSESSMENTS:    PROs; Questionnaires given to patient to complete in clinic. Collected questionnaires and checked for completeness and accuracy.  Timed Get Up and Go Test; Completed per protocol by this trained research RN.  History of Falls; Patient denies any falls in the past 6 months.  Smoking Status; Patient reports smoking more than 100 cigarettes in her lifetime. She quit more than 10 years ago. Patient smoked on average about 5 cigarettes per day and smoked for a total of 6 years.  Concomitant Medications;  Reviewed medications with patient. She takes Tylenol PRN for pain once or twice a day. She is not taking any Vitamin E, Glutamine, Vitamin B6, Fish Oil or Vitamin B12. She is not using any topical agents and has not used any complementary or alternative medicines.  Neuropen Assessment; Completed per protocol by this certified research RN with assistance recording by Hendricks Limes, RN.  [place] Assessment; Completed per protocol by this certified research RN with assistance timing and recording by Hendricks Limes, RN.  History;  Patient denies any neuropathy symptoms. She reported feeling 2 middle toes on her left foot cramp one day last week. Patient thought they felt numb while they were cramping but denies any numbness or tingling at any other time than the one time numbness associated with the cramping.  Ht and Wt;  See VS flow sheet.  Registration; Patient meets all eligibility requirements to be enrolled on the S1714 study.  Eligibility also confirmed by another research RN, Hendricks Limes. Dr. Lindi Adie agrees patient meets eligiblity and patient was registered to the S1714 study via [employer] and assigned patient ID [id number].    Labs; Baseline mandatory and optional research labs collected per protocol along with other standard of care labs  ordered by provider, including creatinine level.   [place];  Mandatory blood specimen collected during the last 10 minutes of paclitaxel infusion at 3:55 pm via venipuncture . Patient tolerated well.  Specimen taken immediately to the lab for processing.  Plan: Thanked patient for enrolling on this study today. Next study visit due in 4 weeks.  Informed patient we will schedule the study visit same day she is in clinic for treatment.  Encouraged her to call clinic if any questions or concerns prior to next appointment. She verbalized understanding.   Foye Spurling, BSN, [employer]

## 2020-02-16 NOTE — Progress Notes (Signed)
Met with patient at registration to obtain income for grant.  Patient approved for one-time $1000 Advertising account executive and qualifications to assist with personal expenses while going through treatment. Gave her a copy of the approval letter and expense sheet along with the Outpatient pharmacy information and explained how the grant works. She received a gas card today from her grant.  She has my card for any additional financial questions or concerns.

## 2020-02-17 ENCOUNTER — Telehealth: Payer: Self-pay | Admitting: *Deleted

## 2020-02-18 ENCOUNTER — Other Ambulatory Visit: Payer: Self-pay

## 2020-02-18 ENCOUNTER — Other Ambulatory Visit (HOSPITAL_COMMUNITY): Payer: Self-pay | Admitting: Diagnostic Radiology

## 2020-02-18 ENCOUNTER — Ambulatory Visit
Admission: RE | Admit: 2020-02-18 | Discharge: 2020-02-18 | Disposition: A | Discharge status: Home / Self Care | Payer: Medicare HMO | Source: Ambulatory Visit | Attending: Hematology and Oncology | Admitting: Hematology and Oncology

## 2020-02-18 DIAGNOSIS — Z170 Estrogen receptor positive status [ER+]: Secondary | ICD-10-CM

## 2020-02-18 DIAGNOSIS — N6092 Unspecified benign mammary dysplasia of left breast: Secondary | ICD-10-CM | POA: Diagnosis not present

## 2020-02-18 DIAGNOSIS — C50512 Malignant neoplasm of lower-outer quadrant of left female breast: Secondary | ICD-10-CM

## 2020-02-18 DIAGNOSIS — N6324 Unspecified lump in the left breast, lower inner quadrant: Secondary | ICD-10-CM | POA: Diagnosis not present

## 2020-02-18 IMAGING — MR MR BREAST BX W LOC DEV 1ST LESION IMAGE BX SPEC MR GUIDE*L*
8 of 12 series · 32 of 48 positions shown · IV contrast (gadavist)
Comparison: Previous exams.
COMPARISON: Previous exams.

Addendum:
CLINICAL DATA: Patient presents for MR guided core biopsy of LEFT
breast mass. Recent ultrasound-guided core biopsy of a 3 cm mass in
the 5 o'clock location shows grade 3 invasive ductal carcinoma. A
second ultrasound-guided core biopsy of mass in the 6 o'clock
location showed concordant fibrocystic changes. MRI shows a solid
mass MEDIAL to the known malignancy in the LOWER INNER QUADRANT of
the LEFT breast for which biopsy is recommended.

EXAM:
MRI GUIDED CORE NEEDLE BIOPSY OF THE LEFT BREAST
TECHNIQUE: Multiplanar, multisequence MR imaging of the LEFT breast was
performed both before and after administration of intravenous
contrast.
CONTRAST:  5 ml Gadavist

[Series 2: fiducial unilateral · sagittal · 2.0mm · 1.33mm/px · 2 of 52 slices shown]
[im 1/52]
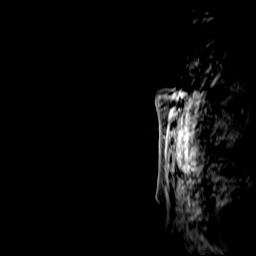
[im 52/52]
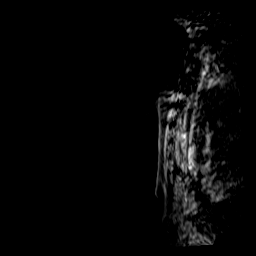

[Series 3: dynamic pre · axial · non-contrast · 1.3mm · 0.73mm/px · z∈[-87,+119]mm · 5 of 160 slices shown]
[im 1/160]
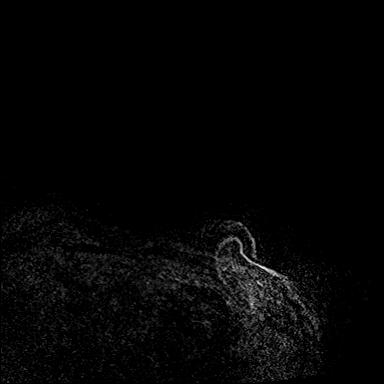
[im 40/160]
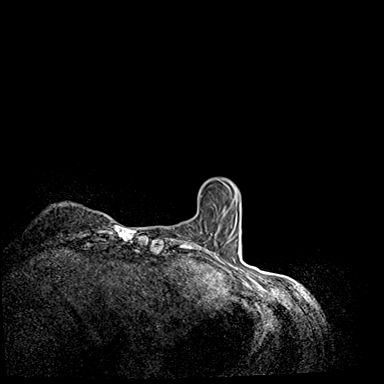
[im 80/160]
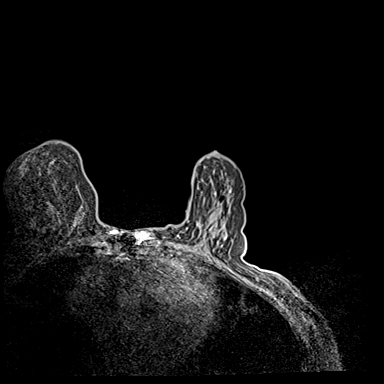
[im 120/160]
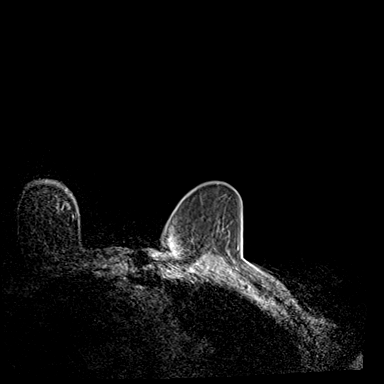
[im 160/160]
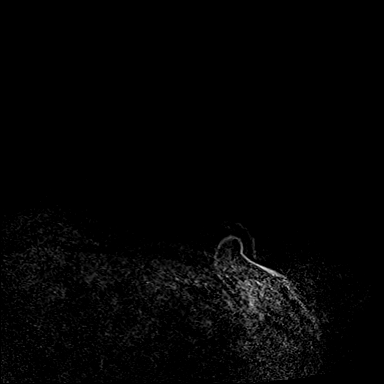

[Series 4: dynamic post 20 · axial · 1.3mm · 0.73mm/px · z∈[-87,+119]mm · 5 of 160 slices shown (1 of 2)]
[im 1/160]
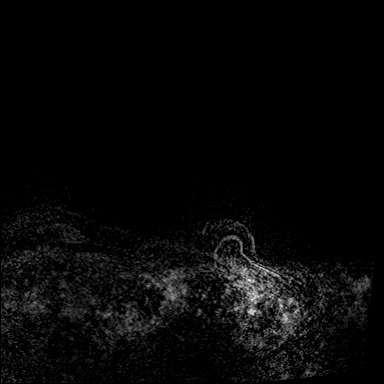
[im 40/160]
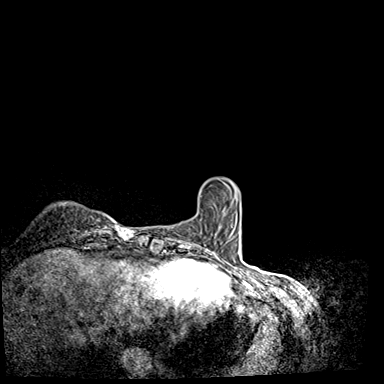
[im 80/160]
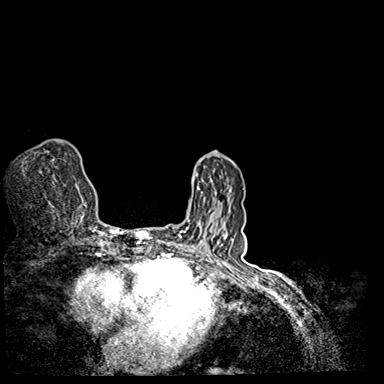
[im 120/160]
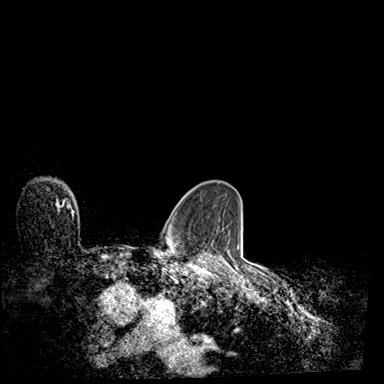
[im 160/160]
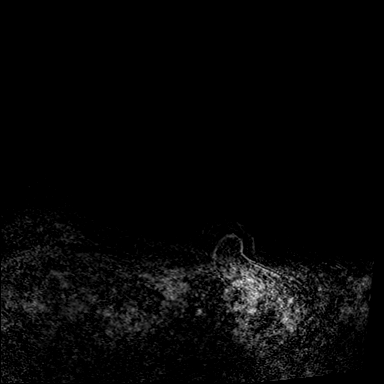

[Series 5: dynamic post 20 · axial · 1.3mm · 0.73mm/px · z∈[-87,+119]mm · 4 of 160 slices shown (2 of 2)]
[im 1/160]
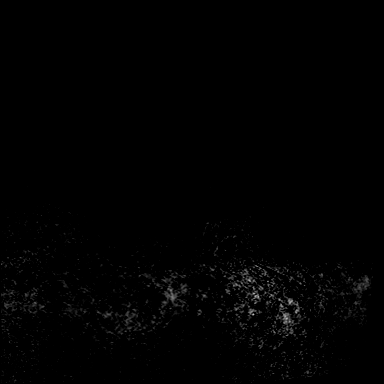
[im 54/160]
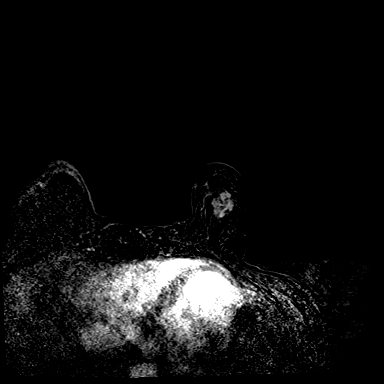
[im 107/160]
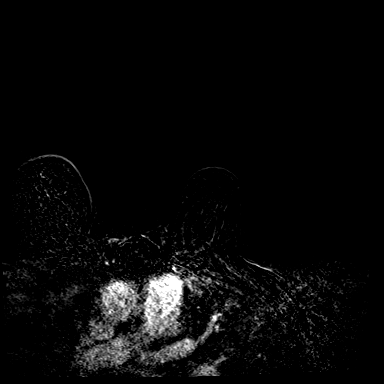
[im 160/160]
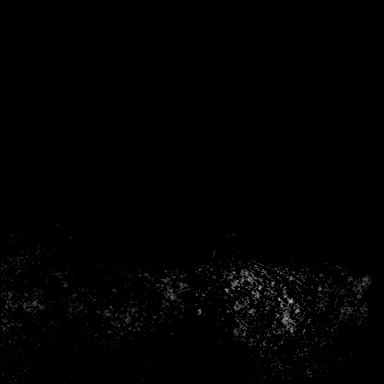

[Series 6: dynamic post 3 · axial · 1.3mm · 0.73mm/px · z∈[-87,+119]mm · 4 of 160 slices shown (1 of 2)]
[im 1/160]
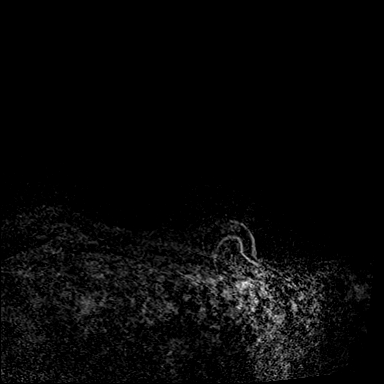
[im 54/160]
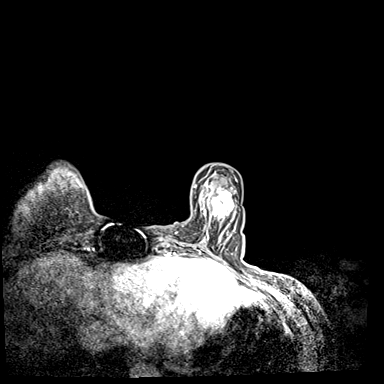
[im 107/160]
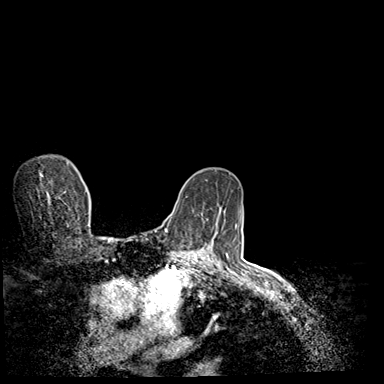
[im 160/160]
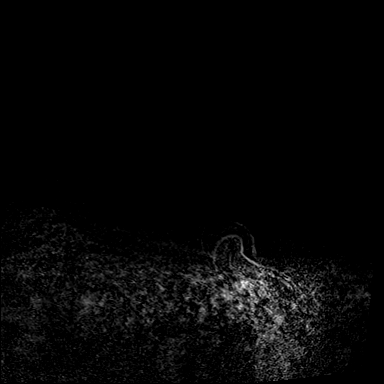

[Series 7: dynamic post 3 · axial · 1.3mm · 0.73mm/px · z∈[-87,+119]mm · 4 of 160 slices shown (2 of 2)]
[im 1/160]
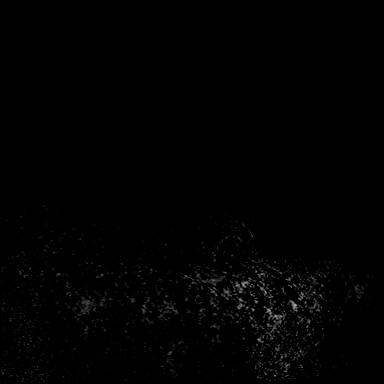
[im 54/160]
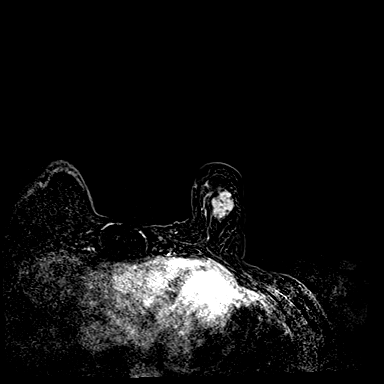
[im 107/160]
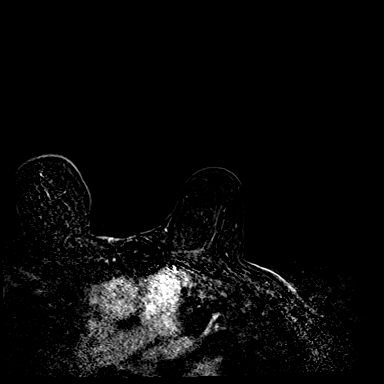
[im 160/160]
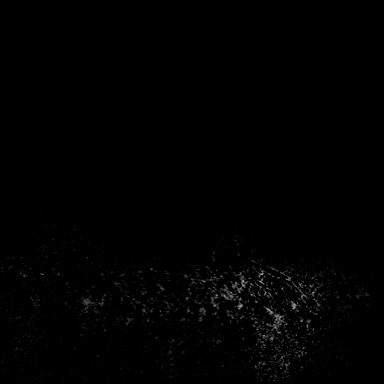

[Series 8: delay · axial · delayed · 1.3mm · 0.73mm/px · z∈[-87,+119]mm · 4 of 160 slices shown]
[im 1/160]
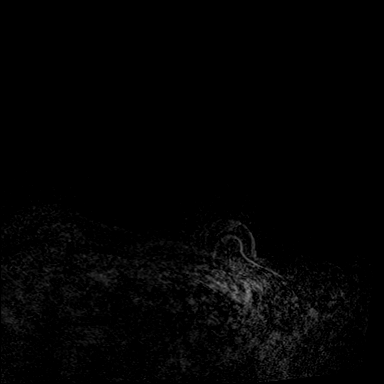
[im 54/160]
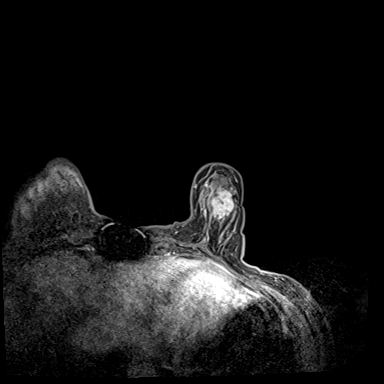
[im 107/160]
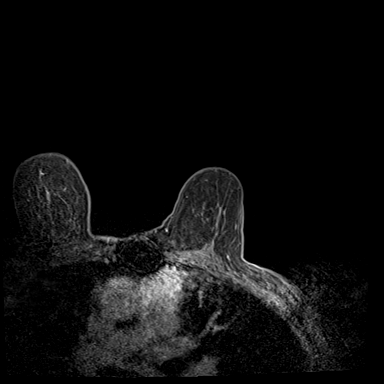
[im 160/160]
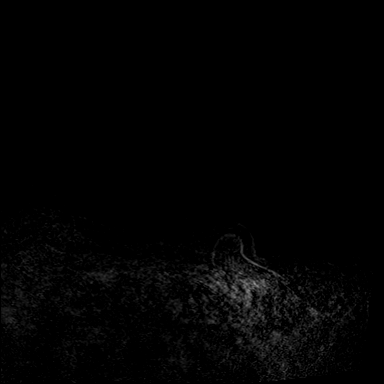

[Series 9: delay_sub · axial · 1.3mm · 0.73mm/px · z∈[-87,+119]mm · 4 of 160 slices shown]
[im 1/160]
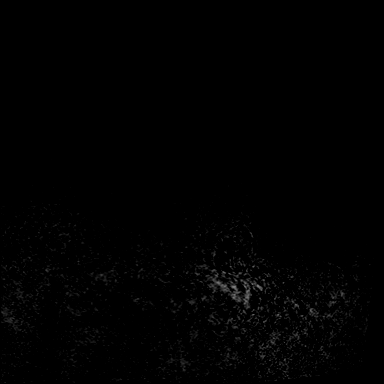
[im 54/160]
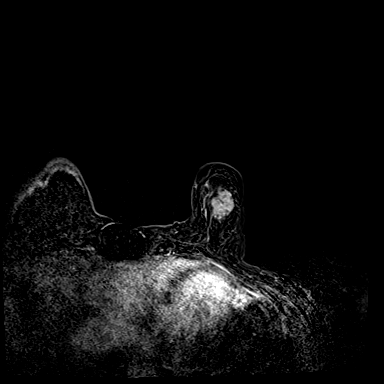
[im 107/160]
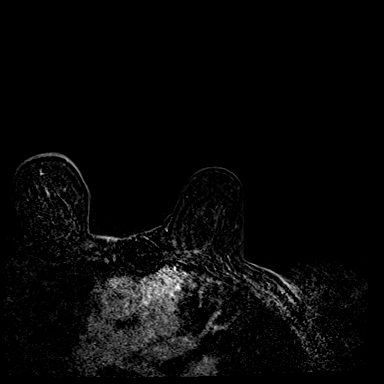
[im 160/160]
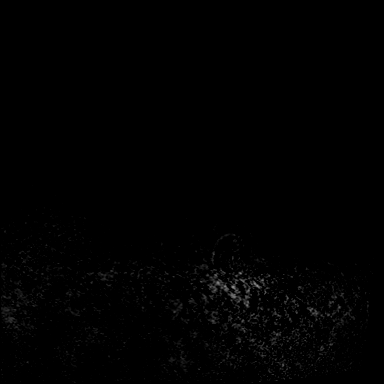

[32 of 48 positions shown; findings below may reference images not displayed]

FINDINGS: I met with the patient, and we discussed the procedure of MRI guided
biopsy, including risks, benefits, and alternatives. Specifically,
we discussed the risks of infection, bleeding, tissue injury, clip
migration, and inadequate sampling. Informed, written consent was
given. The usual time out protocol was performed immediately prior
to the procedure.

Using sterile technique, 1% Lidocaine, MRI guidance, and a 9 gauge
vacuum assisted device, biopsy was performed of posterior LOWER
INNER QUADRANT of the LEFT breast using a LATERAL to MEDIAL
approach. At the conclusion of the procedure, a barbell shaped
tissue marker clip was deployed into the biopsy cavity. Follow-up
2-view mammogram was performed and dictated separately.
IMPRESSION: MRI guided biopsy of mass in the posterior LOWER INNER QUADRANT of
the LEFT breast. No apparent complications.

ADDENDUM:
Pathology revealed ATYPICAL DUCTAL HYPERPLASIA WITH CALCIFICATIONS
of the LEFT breast, lower inner posterior. This was found to be
concordant by Dr. PAO, with excision recommended.

Pathology results were discussed with the patient by telephone. The
patient reported doing well after the biopsy with tenderness at the
site. Post biopsy instructions and care were reviewed and questions
were answered. The patient was encouraged to call The [REDACTED]

The patient has a recent diagnosis of left breast cancer and should
follow her outlined treatment plan. Patient is scheduled to see Dr.
PAO [DATE].

Pathology results reported by PAO RN on [DATE].

*** End of Addendum ***
FINDINGS: I met with the patient, and we discussed the procedure of MRI guided
biopsy, including risks, benefits, and alternatives. Specifically,
we discussed the risks of infection, bleeding, tissue injury, clip
migration, and inadequate sampling. Informed, written consent was
given. The usual time out protocol was performed immediately prior
to the procedure.

Using sterile technique, 1% Lidocaine, MRI guidance, and a 9 gauge
vacuum assisted device, biopsy was performed of posterior LOWER
INNER QUADRANT of the LEFT breast using a LATERAL to MEDIAL
approach. At the conclusion of the procedure, a barbell shaped
tissue marker clip was deployed into the biopsy cavity. Follow-up
2-view mammogram was performed and dictated separately.
IMPRESSION: MRI guided biopsy of mass in the posterior LOWER INNER QUADRANT of
the LEFT breast. No apparent complications.

## 2020-02-18 IMAGING — MG MM BREAST LOCALIZATION CLIP
4 series · 4 of 12 positions shown · non-contrast
Comparison: MRI [DATE] and earlier imaging
COMPARISON: MRI [DATE] and earlier imaging

Addendum:
CLINICAL DATA: Status post MRI guided core biopsy of LEFT breast.

EXAM:
DIAGNOSTIC LEFT MAMMOGRAM POST MRI BIOPSY

[L ML synth-2D]
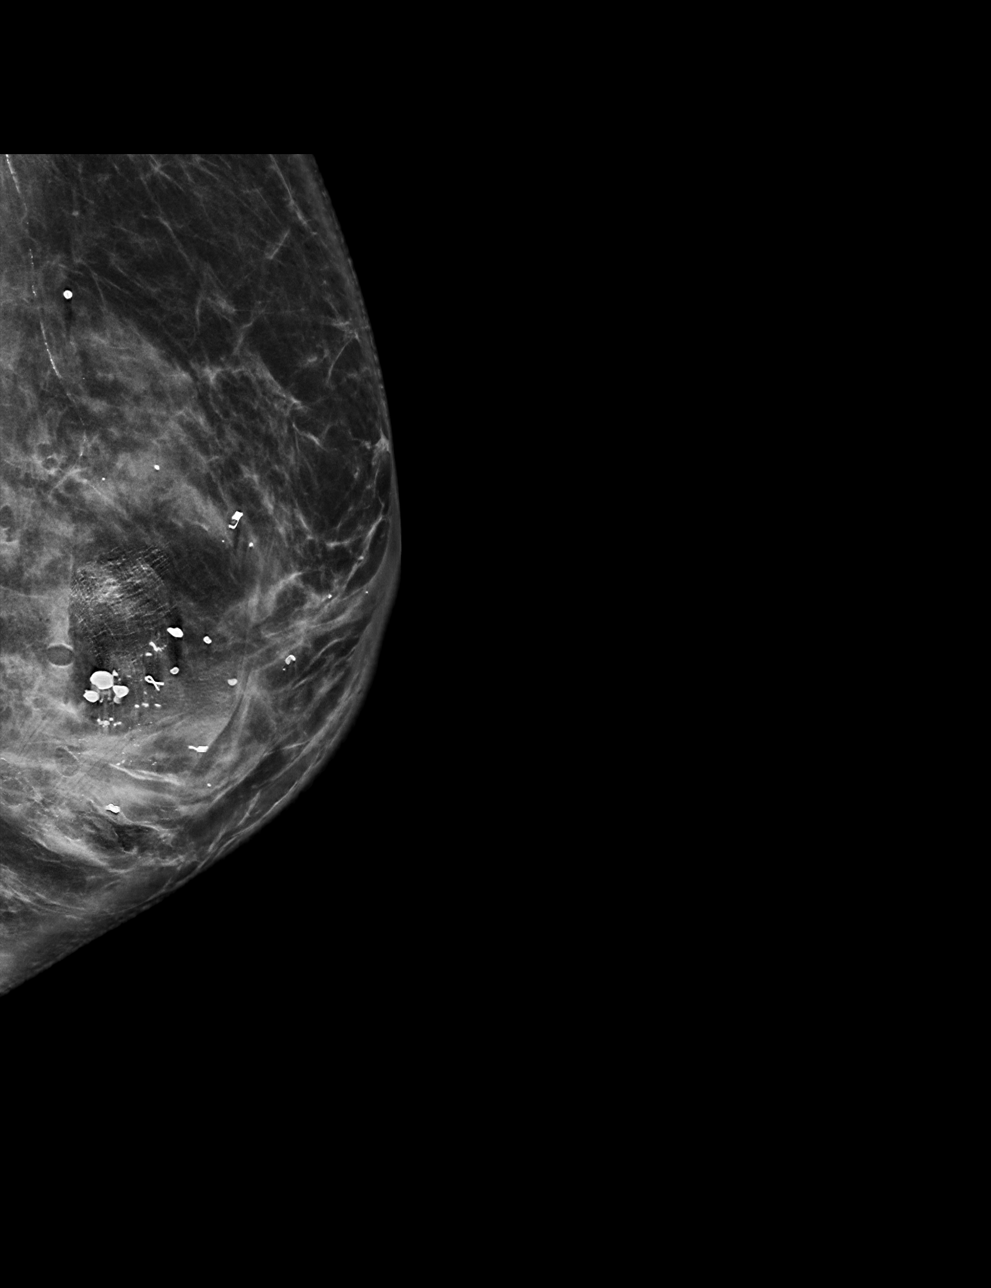

[L CC synth-2D]
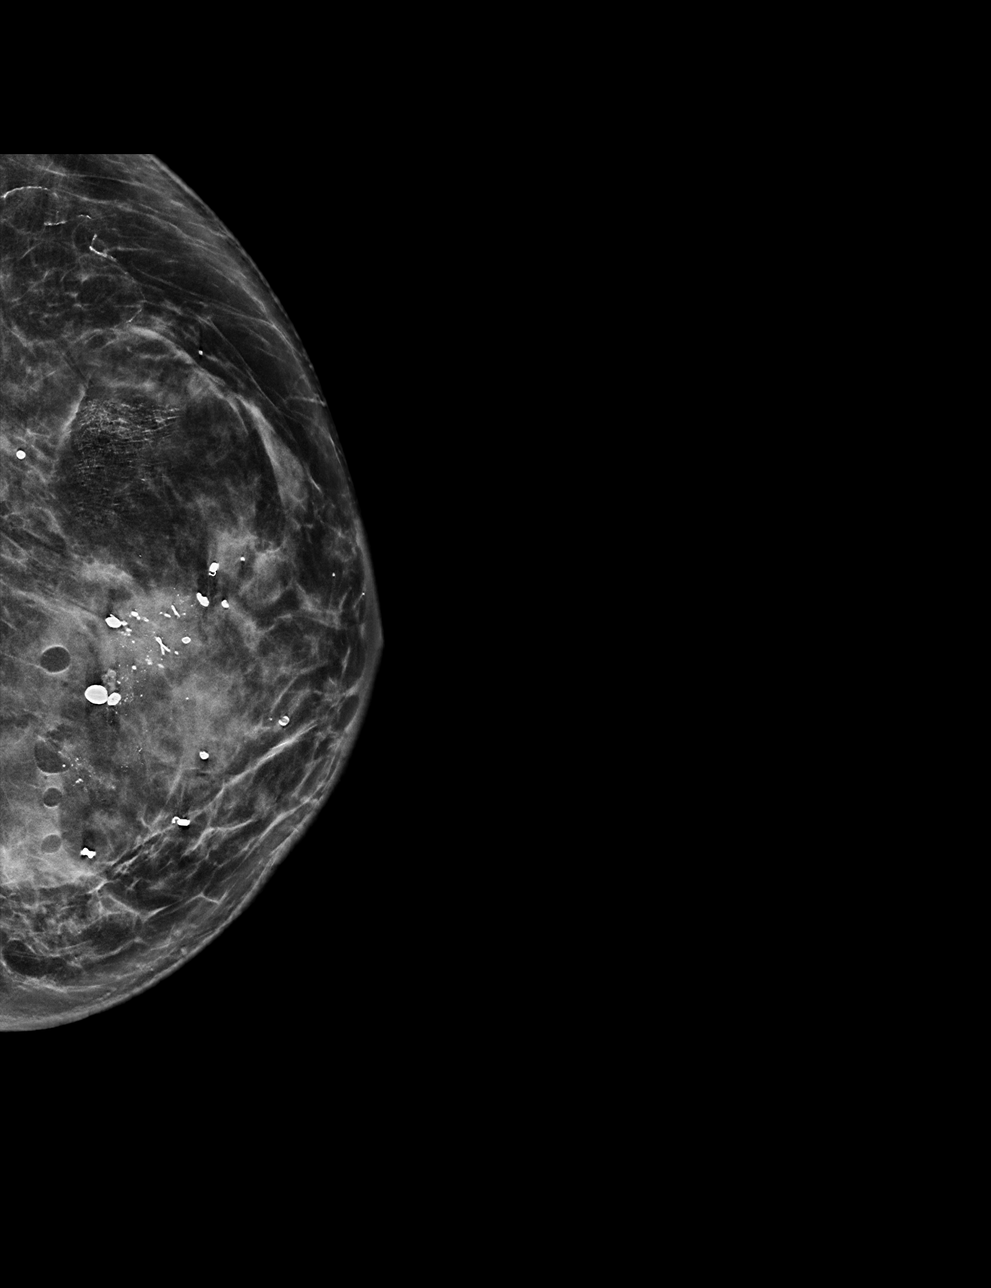

[L ML tomo · tomo slice 35/69.0]
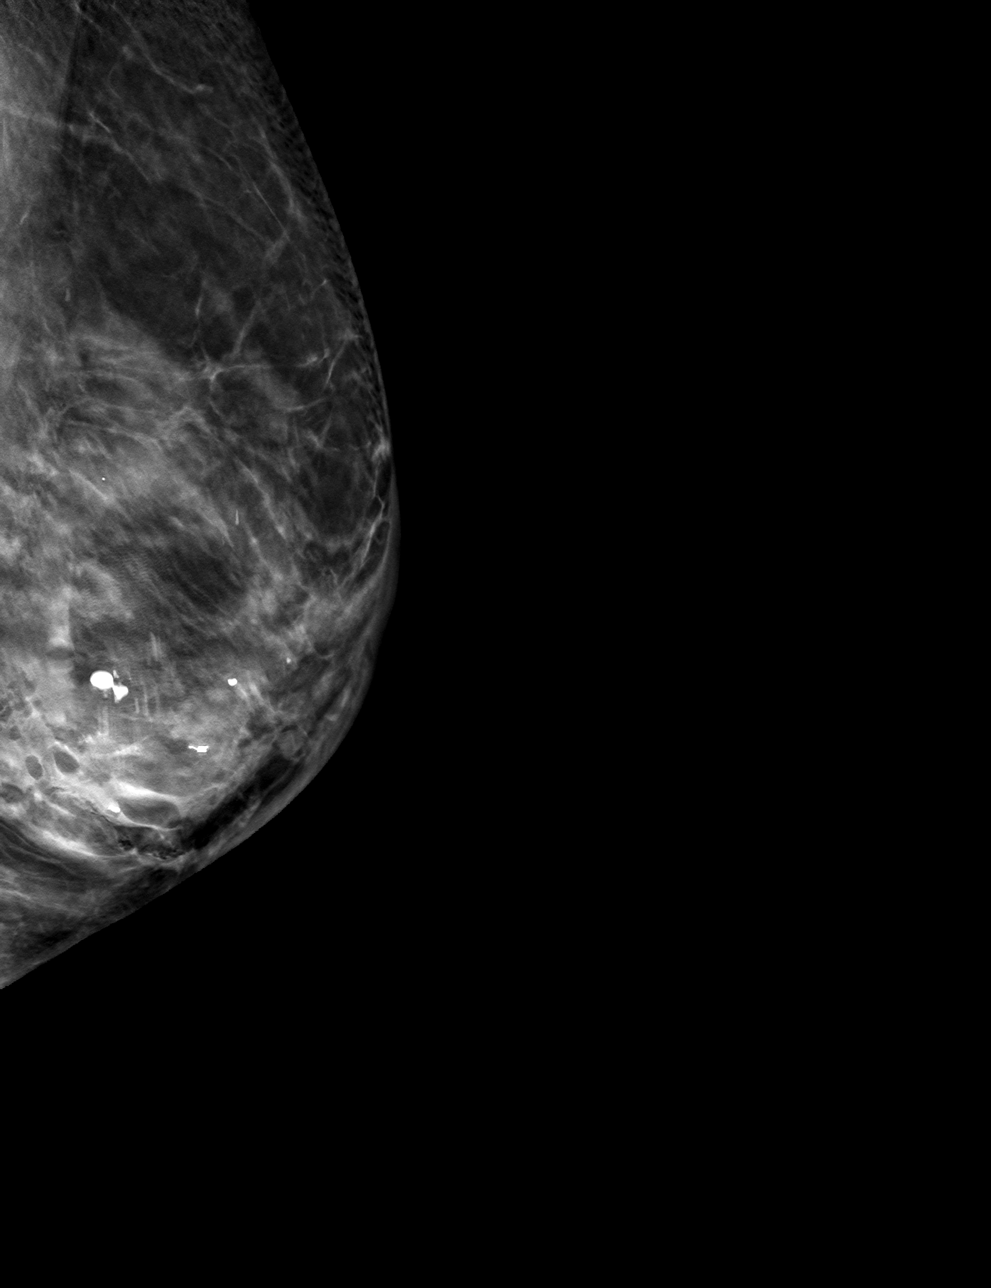

[L CC tomo · tomo slice 33/66.0]
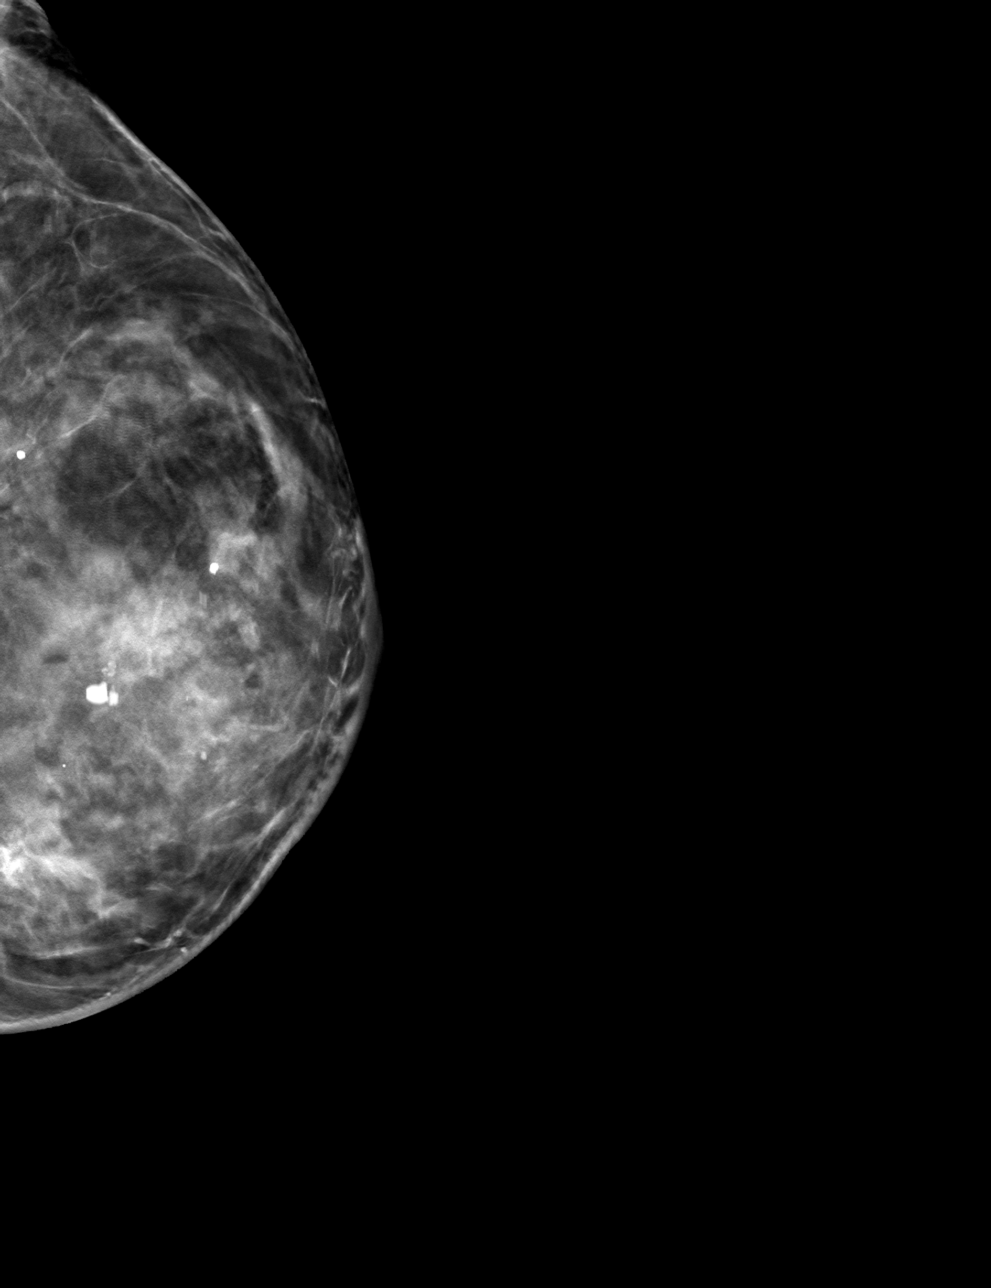

[4 of 12 positions shown; findings below may reference images not displayed]

FINDINGS: Mammographic images were obtained following MRI guided biopsy of
enhancing mass in the posterior LOWER INNER QUADRANT of the LEFT
breast and placement of a barbell shaped clip. The biopsy marking
clip is 5.4 Cm medial to the biopsied mass.
IMPRESSION: Clip migration following MRI biopsy. Barbell clip is 5.4 cm medial
to the biopsy site.

Final Assessment: Post Procedure Mammograms for Marker Placement

ADDENDUM:
CORRECTED REPORT:

The barbell shaped clip, placed at the time of MR guided core biopsy
is 1.2 centimeters MEDIAL to the site of biopsy, along the MEDIAL
posterior aspect of the known malignancy in the 5 o'clock location.
Fine heterogeneous calcifications are identified at the biopsy site
and can be used for localization purposes, as needed.

*** End of Addendum ***
FINDINGS: Mammographic images were obtained following MRI guided biopsy of
enhancing mass in the posterior LOWER INNER QUADRANT of the LEFT
breast and placement of a barbell shaped clip. The biopsy marking
clip is 5.4 Cm medial to the biopsied mass.
IMPRESSION: Clip migration following MRI biopsy. Barbell clip is 5.4 cm medial
to the biopsy site.

Final Assessment: Post Procedure Mammograms for Marker Placement

## 2020-02-18 MED ORDER — GADOBUTROL 1 MMOL/ML IV SOLN
5.0000 mL | Freq: Once | INTRAVENOUS | Status: AC | PRN
  Administered 2020-02-18: 5 mL via INTRAVENOUS

## 2020-02-22 NOTE — Progress Notes (Signed)
Patient Care Team: [name], MD as PCP - General (Family Medicine) [name], MD as PCP - Cardiology (Cardiology) [name], MD as Surgeon (General Surgery) [name], RN as Oncology Nurse Navigator Mauro Kaufmann, RN as Oncology Nurse Navigator  DIAGNOSIS:    ICD-10-CM   1. Malignant neoplasm of lower-outer quadrant of left breast of female, estrogen receptor positive ([place])  C50.512    Z17.0     SUMMARY OF ONCOLOGIC HISTORY: Oncology History  Malignant neoplasm of lower-outer quadrant of left breast of female, estrogen receptor positive ([place])  01/18/2020 Initial Diagnosis   Palpable left breast lump with calcifications at 5 o'clock position measuring 3.3 cm.  Biopsy revealed grade 3 IDC ER 95%, PR 0%, HER-2 3+ positive, Ki-67 50%, axilla negative, T2N0   01/26/2020 Cancer Staging   Staging form: Breast, AJCC 8th Edition - Clinical stage from 01/26/2020: Stage IIA (cT2, cN0, cM0, G3, ER+, PR-, HER2+) - Signed by Nicholas Lose, MD on 01/26/2020   02/16/2020 -  Chemotherapy   The patient had PACLitaxel (TAXOL) 120 mg in sodium chloride 0.9 % 250 mL chemo infusion (</= 87m/m2), 80 mg/m2 = 120 mg, Intravenous,  Once, 1 of 3 cycles Administration: 120 mg (02/16/2020) trastuzumab-anns (KANJINTI) 189 mg in sodium chloride 0.9 % 250 mL chemo infusion, 4 mg/kg = 189 mg, Intravenous,  Once, 1 of 3 cycles Administration: 189 mg (02/16/2020)  for chemotherapy treatment.      CHIEF COMPLIANT: Cycle 2 Taxol Herceptin  INTERVAL HISTORY: Rachel Grimsleyis a 79y.o. with above-mentioned history of left breast cancer. She is currently on neoadjuvant chemotherapy with Taxol Herceptin. Her port was placed by Dr. CBrantley Stageon 02/15/20. Echo on 02/02/20 showed an ejection fraction of 60-65%. She presents to the clinic today for a toxicity check following cycle 1 and treatment.  ALLERGIES:  is allergic to fosamax [alendronate], prednisone, clindamycin/lincomycin,  and penicillins.  MEDICATIONS:  Current Outpatient Medications  Medication Sig Dispense Refill  . amLODipine (NORVASC) 2.5 MG tablet TAKE 1 TABLET (2.5 MG TOTAL) BY MOUTH AT BEDTIME. 90 tablet 2  . aspirin EC 81 MG tablet Take 81 mg by mouth daily.    . Biotin 10000 MCG TABS Take 10,000 mg by mouth daily.     . calcium citrate (CALCITRATE - DOSED IN MG ELEMENTAL CALCIUM) 950 (200 Ca) MG tablet Take 200 mg of elemental calcium by mouth 2 (two) times daily.     . Calcium-Phosphorus-Vitamin D (CITRACAL +D3 PO) Take 1 tablet by mouth in the morning and at bedtime.     . clopidogrel (PLAVIX) 75 MG tablet Take 1 tablet (75 mg total) by mouth daily. 90 tablet 3  . diphenhydrAMINE (BENADRYL) 25 mg capsule Take 25 mg by mouth at bedtime as needed for allergies.     .Marland Kitchenlidocaine-prilocaine (EMLA) cream Apply to affected area once 30 g 3  . LORazepam (ATIVAN) 0.5 MG tablet Take 1 tablet (0.5 mg total) by mouth every 6 (six) hours as needed (Nausea or vomiting). 30 tablet 0  . metoprolol succinate (TOPROL-XL) 25 MG 24 hr tablet Take 25 mg by mouth daily.    . nitroGLYCERIN (NITROSTAT) 0.4 MG SL tablet Place 1 tablet (0.4 mg total) under the tongue every 5 (five) minutes as needed for chest pain. 30 tablet 0  . ondansetron (ZOFRAN) 8 MG tablet Take 1 tablet (8 mg total) by mouth 2 (two) times daily as needed (Nausea or vomiting). 30 tablet 1  . oxyCODONE (  OXY IR/ROXICODONE) 5 MG immediate release tablet Take 1 tablet (5 mg total) by mouth every 6 (six) hours as needed for severe pain. 15 tablet 0  . pantoprazole (PROTONIX) 40 MG tablet TAKE 1 TABLET BY MOUTH EVERY DAY (Patient taking differently: Take 40 mg by mouth daily. ) 90 tablet 2  . prochlorperazine (COMPAZINE) 10 MG tablet Take 1 tablet (10 mg total) by mouth every 6 (six) hours as needed (Nausea or vomiting). 30 tablet 1  . Propylene Glycol (SYSTANE BALANCE) 0.6 % SOLN Place 2 drops into both eyes as needed (Dry eye).    . rosuvastatin (CRESTOR) 40  MG tablet Take 40 mg by mouth daily.      No current facility-administered medications for this visit.    PHYSICAL EXAMINATION: ECOG PERFORMANCE STATUS: 1 - Symptomatic but completely ambulatory  Vitals:   02/23/20 1142  BP: (!) 141/58  Pulse: 79  Resp: 16  Temp: 98.7 F (37.1 C)  SpO2: 100%   Filed Weights   02/23/20 1142  Weight: 107 lb 9.6 oz (48.8 kg)    LABORATORY DATA:  I have reviewed the data as listed CMP Latest Ref Rng & Units 02/16/2020 02/09/2020 07/13/2019  Glucose 70 - 99 mg/dL 112(H) 72 85  BUN 8 - 23 mg/dL _0 Creatinine 0.44 - 1.00 mg/dL 0.82 0.70 0.70  Sodium 135 - 145 mmol/L 142 139 142  Potassium 3.5 - 5.1 mmol/L 3.9 4.4 5.0  Chloride 98 - 111 mmol/L 107 102 104  CO2 22 - 32 mmol/L _1 Calcium 8.9 - 10.3 mg/dL 9.6 10.1 10.0  Total Protein 6.5 - 8.1 g/dL 7.0 7.8 -  Total Bilirubin 0.3 - 1.2 mg/dL 0.4 0.7 -  Alkaline Phos 38 - 126 U/L 69 79 -  AST 15 - 41 U/L 18 22 -  ALT 0 - 44 U/L 9 11 -    Lab Results  Component Value Date   WBC 5.5 02/23/2020   HGB 11.5 (L) 02/23/2020   HCT 34.9 (L) 02/23/2020   MCV 91.8 02/23/2020   PLT 274 02/23/2020   NEUTROABS 4.0 02/23/2020    ASSESSMENT & PLAN:  Malignant neoplasm of lower-outer quadrant of left breast of female, estrogen receptor positive ([place]) 01/18/2020:Palpable left breast lump with calcifications at 5 o'clock position measuring 3.3 cm.  Biopsy revealed grade 3 IDC ER 95%, PR 0%, HER-2 3+ positive, Ki-67 50%, axilla negative, T2N0 stage IIa clinical stage Bilateral implants removed 2005  Pathology and radiology counseling: Discussed with the patient, the details of pathology including the type of breast cancer,the clinical staging, the significance of ER, PR and HER-2/neu receptors and the implications for treatment. After reviewing the pathology in detail, we proceeded to discuss the different treatment options between surgery, radiation, chemotherapy, antiestrogen  therapies.  Treatment plan: 1.  Neo- adjuvant chemotherapy with Taxol Herceptin  2.  Breast conserving surgery with sentinel lymph node biopsy 3.  Adjuvant radiation therapy followed by 4.  Adjuvant antiestrogen therapy with anastrozole 1 mg daily x5 years  Breast MRI: 2.3 cm mass, indeterminate 8 mm mass (Biopsy: ADH), no LN --------------------------------------------------------------------------------------------------------------------------------------------------------------- Current Treatment: Cycle 2 taxol-Herceptin Chemo toxicities: Patient did not experience any side effects to chemotherapy. The nausea that she had initially was related to anesthesia from the port placement. Bowels are moving normally.  Monitoring her blood counts.  Her hemoglobin slightly declined today. Return to clinic weekly for chemo and every other week for follow-up with me.  No orders of  the defined types were placed in this encounter.  The patient has a good understanding of the overall plan. she agrees with it. she will call with any problems that may develop before the next visit here.  Total time spent: 30 mins including face to face time and time spent for planning, charting and coordination of care  Nicholas Lose, MD 02/23/2020  I, Cloyde Reams Dorshimer, am acting as scribe for Dr. Nicholas Lose.  I have reviewed the above documentation for accuracy and completeness, and I agree with the above.

## 2020-02-23 ENCOUNTER — Encounter: Payer: Self-pay | Admitting: *Deleted

## 2020-02-23 ENCOUNTER — Inpatient Hospital Stay: Payer: Medicare HMO | Admitting: Hematology and Oncology

## 2020-02-23 ENCOUNTER — Inpatient Hospital Stay: Payer: Medicare HMO

## 2020-02-23 ENCOUNTER — Other Ambulatory Visit: Payer: Self-pay

## 2020-02-23 ENCOUNTER — Other Ambulatory Visit: Payer: Medicare HMO

## 2020-02-23 ENCOUNTER — Telehealth: Payer: Self-pay | Admitting: *Deleted

## 2020-02-23 DIAGNOSIS — C50512 Malignant neoplasm of lower-outer quadrant of left female breast: Secondary | ICD-10-CM

## 2020-02-23 DIAGNOSIS — Z170 Estrogen receptor positive status [ER+]: Secondary | ICD-10-CM

## 2020-02-23 DIAGNOSIS — Z5111 Encounter for antineoplastic chemotherapy: Secondary | ICD-10-CM | POA: Diagnosis not present

## 2020-02-23 LAB — CMP (CANCER CENTER ONLY)
ALT: 15 U/L (ref 0–44)
AST: 21 U/L (ref 15–41)
Albumin: 4.2 g/dL (ref 3.5–5.0)
Alkaline Phosphatase: 80 U/L (ref 38–126)
Anion gap: 6 (ref 5–15)
BUN: 17 mg/dL (ref 8–23)
CO2: 25 mmol/L (ref 22–32)
Calcium: 9.5 mg/dL (ref 8.9–10.3)
Chloride: 108 mmol/L (ref 98–111)
Creatinine: 0.72 mg/dL (ref 0.44–1.00)
GFR, Est AFR Am: >60 mL/min (ref >60)
GFR, Estimated: >60 mL/min (ref >60)
Glucose, Bld: 102 mg/dL — ABNORMAL HIGH (ref 70–99)
Potassium: 4.1 mmol/L (ref 3.5–5.1)
Sodium: 139 mmol/L (ref 135–145)
Total Bilirubin: 0.4 mg/dL (ref 0.3–1.2)
Total Protein: 6.9 g/dL (ref 6.5–8.1)

## 2020-02-23 LAB — CBC WITH DIFFERENTIAL (CANCER CENTER ONLY)
Abs Immature Granulocytes: 0.07 10*3/uL (ref 0.00–0.07)
Basophils Absolute: 0 10*3/uL (ref 0.0–0.1)
Basophils Relative: 1 %
Eosinophils Absolute: 0.3 10*3/uL (ref 0.0–0.5)
Eosinophils Relative: 6 %
HCT: 34.9 % — ABNORMAL LOW (ref 36.0–46.0)
Hemoglobin: 11.5 g/dL — ABNORMAL LOW (ref 12.0–15.0)
Immature Granulocytes: 1 %
Lymphocytes Relative: 16 %
Lymphs Abs: 0.9 10*3/uL (ref 0.7–4.0)
MCH: 30.3 pg (ref 26.0–34.0)
MCHC: 33 g/dL (ref 30.0–36.0)
MCV: 91.8 fL (ref 80.0–100.0)
Monocytes Absolute: 0.2 10*3/uL (ref 0.1–1.0)
Monocytes Relative: 4 %
Neutro Abs: 4 10*3/uL (ref 1.7–7.7)
Neutrophils Relative %: 72 %
Platelet Count: 274 10*3/uL (ref 150–400)
RBC: 3.8 MIL/uL — ABNORMAL LOW (ref 3.87–5.11)
RDW: 13.3 % (ref 11.5–15.5)
WBC Count: 5.5 10*3/uL (ref 4.0–10.5)
nRBC: 0 % (ref 0.0–0.2)

## 2020-02-23 MED ORDER — HEPARIN SOD (PORK) LOCK FLUSH 100 UNIT/ML IV SOLN
500.0000 [IU] | Freq: Once | INTRAVENOUS | Status: AC | PRN
  Administered 2020-02-23: 500 [IU]
  Filled 2020-02-23: qty 5

## 2020-02-23 MED ORDER — FAMOTIDINE IN NACL 20-0.9 MG/50ML-% IV SOLN
INTRAVENOUS | Status: AC
  Filled 2020-02-23: qty 50

## 2020-02-23 MED ORDER — DIPHENHYDRAMINE HCL 50 MG/ML IJ SOLN
INTRAMUSCULAR | Status: AC
  Filled 2020-02-23: qty 1

## 2020-02-23 MED ORDER — SODIUM CHLORIDE 0.9 % IV SOLN
80.0000 mg/m2 | Freq: Once | INTRAVENOUS | Status: AC
  Administered 2020-02-23: 120 mg via INTRAVENOUS
  Filled 2020-02-23: qty 20

## 2020-02-23 MED ORDER — ACETAMINOPHEN 325 MG PO TABS
ORAL_TABLET | ORAL | Status: AC
  Filled 2020-02-23: qty 2

## 2020-02-23 MED ORDER — SODIUM CHLORIDE 0.9 % IV SOLN
Freq: Once | INTRAVENOUS | Status: AC
  Filled 2020-02-23: qty 250

## 2020-02-23 MED ORDER — TRASTUZUMAB-ANNS CHEMO 150 MG IV SOLR
2.0000 mg/kg | Freq: Once | INTRAVENOUS | Status: AC
  Administered 2020-02-23: 105 mg via INTRAVENOUS
  Filled 2020-02-23: qty 5

## 2020-02-23 MED ORDER — ACETAMINOPHEN 325 MG PO TABS
650.0000 mg | ORAL_TABLET | Freq: Once | ORAL | Status: AC
  Administered 2020-02-23: 650 mg via ORAL

## 2020-02-23 MED ORDER — FAMOTIDINE IN NACL 20-0.9 MG/50ML-% IV SOLN
20.0000 mg | Freq: Once | INTRAVENOUS | Status: AC
  Administered 2020-02-23: 20 mg via INTRAVENOUS

## 2020-02-23 MED ORDER — SODIUM CHLORIDE 0.9 % IV SOLN
10.0000 mg | Freq: Once | INTRAVENOUS | Status: AC
  Administered 2020-02-23: 10 mg via INTRAVENOUS
  Filled 2020-02-23: qty 10

## 2020-02-23 MED ORDER — DIPHENHYDRAMINE HCL 50 MG/ML IJ SOLN
25.0000 mg | Freq: Once | INTRAMUSCULAR | Status: AC
  Administered 2020-02-23: 25 mg via INTRAVENOUS

## 2020-02-23 MED ORDER — SODIUM CHLORIDE 0.9% FLUSH
10.0000 mL | INTRAVENOUS | Status: DC | PRN
  Administered 2020-02-23: 10 mL
  Filled 2020-02-23: qty 10

## 2020-02-23 NOTE — Research (Signed)
DCP-001 USE OF A CLINICAL TRIAL SCREENING TOOL TO ADDRESS CANCER HEALTH DISPARITIES IN THE [facility] COMMUNITY ONCOLOGY RESEARCH PROGRAM ([facility])  02/23/2020 1:16PM  DCP-001CONSENT:Met with patient along with Clinical Research Nurse Foye Spurling in the infusion room for approximately 20 minutes, during her chemotherapy, to discuss voluntary participation in the DCP-001 study. WereviewedtheDCP-001 consent form (protocol version date 02/23/19, [place] Active Date 09/07/19) in full, including the voluntary nature of participation, the project purpose, risks and benefits, and who will see the provided information. Maddalynn verbalizes understanding that this study is a one-time consent for collection of demographic variables and that no patient identifiers are being reported. We also reviewed the [place] form (dated 10/24/14) including the data to be collected, why the information is being collected, who will see the information, and safety measures to keep information private.Upon completion of review, emylee decelle a desire to voluntarily participate in DCP-001. All consent and authorization forms were signed and dated by the patient and myself as the person obtaining consent and signed copies of both documents were provided to [facility].  DCP-001 WORKSHEET:After informed consent was obtained, the DCP-001 Worksheet was completed to collect ethnicity, language, literacy, education, employment, income and smoking history not available in the electronic medical record. Dazani is White, but unsure of specific demographic region and reports that she was a previous smoker of 10 cigarettes per day, but has not smoked in 22 years. Brett was referred to two studies, [phone] (Neuropathy) and UPBEAT and was enrolled in S1714. Her reason for declining participation inthe UPBEAT study is due to her not knowing if she would be able to commit to 11 year follow-up.  Hazleigh wasthanked again forher time and  willingness toparticipate in DCP as well as consider UPBEAT and [id number].

## 2020-02-23 NOTE — Assessment & Plan Note (Signed)
01/18/2020:Palpable left breast lump with calcifications at 5 o'clock position measuring 3.3 cm.  Biopsy revealed grade 3 IDC ER 95%, PR 0%, HER-2 3+ positive, Ki-67 50%, axilla negative, T2N0 stage IIa clinical stage Bilateral implants removed 2005  Pathology and radiology counseling: Discussed with the patient, the details of pathology including the type of breast cancer,the clinical staging, the significance of ER, PR and HER-2/neu receptors and the implications for treatment. After reviewing the pathology in detail, we proceeded to discuss the different treatment options between surgery, radiation, chemotherapy, antiestrogen therapies.  Treatment plan: 1.  Neo- adjuvant chemotherapy with Taxol Herceptin  2.  Breast conserving surgery with sentinel lymph node biopsy 3.  Adjuvant radiation therapy followed by 4.  Adjuvant antiestrogen therapy with anastrozole 1 mg daily x5 years  Breast MRI: 2.3 cm mass, indeterminate 8 mm mass (Biopsy: ADH), no LN --------------------------------------------------------------------------------------------------------------------------------------------------------------- Current Treatment: Cycle 1 taxol-Herceptin Labs reviewed, Chemo consent obtained, chemo education RTC in 1 week for tox check

## 2020-02-23 NOTE — Patient Instructions (Signed)
[facility] Discharge Instructions for Patients Receiving Chemotherapy  Today you received the following chemotherapy agents: trastuzumab and paclitaxel.  To help prevent nausea and vomiting after your treatment, we encourage you to take your nausea medication as directed.   If you develop nausea and vomiting that is not controlled by your nausea medication, call the clinic.   BELOW ARE SYMPTOMS THAT SHOULD BE REPORTED IMMEDIATELY:  *FEVER GREATER THAN 100.5 F  *CHILLS WITH OR WITHOUT FEVER  NAUSEA AND VOMITING THAT IS NOT CONTROLLED WITH YOUR NAUSEA MEDICATION  *UNUSUAL SHORTNESS OF BREATH  *UNUSUAL BRUISING OR BLEEDING  TENDERNESS IN MOUTH AND THROAT WITH OR WITHOUT PRESENCE OF ULCERS  *URINARY PROBLEMS  *BOWEL PROBLEMS  UNUSUAL RASH Items with * indicate a potential emergency and should be followed up as soon as possible.  Feel free to call the clinic should you have any questions or concerns. The clinic phone number is [phone].  Please show the CHEMO ALERT CARD at check-in to the Emergency Department and triage nurse.   

## 2020-02-24 DIAGNOSIS — Z0100 Encounter for examination of eyes and vision without abnormal findings: Secondary | ICD-10-CM | POA: Diagnosis not present

## 2020-02-25 ENCOUNTER — Encounter: Payer: Self-pay | Admitting: *Deleted

## 2020-02-28 ENCOUNTER — Telehealth: Payer: Self-pay | Admitting: Medical

## 2020-02-28 ENCOUNTER — Inpatient Hospital Stay: Payer: Medicare HMO

## 2020-02-28 ENCOUNTER — Other Ambulatory Visit: Payer: Self-pay | Admitting: *Deleted

## 2020-02-28 ENCOUNTER — Other Ambulatory Visit: Payer: Medicare HMO

## 2020-02-28 ENCOUNTER — Other Ambulatory Visit: Payer: Self-pay

## 2020-02-28 ENCOUNTER — Inpatient Hospital Stay (HOSPITAL_BASED_OUTPATIENT_CLINIC_OR_DEPARTMENT_OTHER): Payer: Medicare HMO | Admitting: Medical

## 2020-02-28 ENCOUNTER — Other Ambulatory Visit: Payer: Self-pay | Admitting: Emergency Medicine

## 2020-02-28 ENCOUNTER — Telehealth: Payer: Self-pay | Admitting: *Deleted

## 2020-02-28 VITALS — BP 147/60 | HR 83 | Temp 98.3°F | Resp 18

## 2020-02-28 DIAGNOSIS — C50512 Malignant neoplasm of lower-outer quadrant of left female breast: Secondary | ICD-10-CM

## 2020-02-28 DIAGNOSIS — Z170 Estrogen receptor positive status [ER+]: Secondary | ICD-10-CM

## 2020-02-28 DIAGNOSIS — R109 Unspecified abdominal pain: Secondary | ICD-10-CM

## 2020-02-28 DIAGNOSIS — Z5111 Encounter for antineoplastic chemotherapy: Secondary | ICD-10-CM | POA: Diagnosis not present

## 2020-02-28 DIAGNOSIS — R300 Dysuria: Secondary | ICD-10-CM

## 2020-02-28 LAB — CBC WITH DIFFERENTIAL (CANCER CENTER ONLY)
Abs Immature Granulocytes: 0.02 10*3/uL (ref 0.00–0.07)
Basophils Absolute: 0 10*3/uL (ref 0.0–0.1)
Basophils Relative: 1 %
Eosinophils Absolute: 0.2 10*3/uL (ref 0.0–0.5)
Eosinophils Relative: 4 %
HCT: 35.8 % — ABNORMAL LOW (ref 36.0–46.0)
Hemoglobin: 11.5 g/dL — ABNORMAL LOW (ref 12.0–15.0)
Immature Granulocytes: 1 %
Lymphocytes Relative: 29 %
Lymphs Abs: 1.1 10*3/uL (ref 0.7–4.0)
MCH: 30.5 pg (ref 26.0–34.0)
MCHC: 32.1 g/dL (ref 30.0–36.0)
MCV: 95 fL (ref 80.0–100.0)
Monocytes Absolute: 0.2 10*3/uL (ref 0.1–1.0)
Monocytes Relative: 4 %
Neutro Abs: 2.4 10*3/uL (ref 1.7–7.7)
Neutrophils Relative %: 61 %
Platelet Count: 329 10*3/uL (ref 150–400)
RBC: 3.77 MIL/uL — ABNORMAL LOW (ref 3.87–5.11)
RDW: 13.5 % (ref 11.5–15.5)
WBC Count: 3.9 10*3/uL — ABNORMAL LOW (ref 4.0–10.5)
nRBC: 0 % (ref 0.0–0.2)

## 2020-02-28 LAB — CMP (CANCER CENTER ONLY)
ALT: 20 U/L (ref 0–44)
AST: 22 U/L (ref 15–41)
Albumin: 4.4 g/dL (ref 3.5–5.0)
Alkaline Phosphatase: 70 U/L (ref 38–126)
Anion gap: 7 (ref 5–15)
BUN: 21 mg/dL (ref 8–23)
CO2: 30 mmol/L (ref 22–32)
Calcium: 10 mg/dL (ref 8.9–10.3)
Chloride: 105 mmol/L (ref 98–111)
Creatinine: 0.74 mg/dL (ref 0.44–1.00)
GFR, Est AFR Am: >60 mL/min (ref >60)
GFR, Estimated: >60 mL/min (ref >60)
Glucose, Bld: 105 mg/dL — ABNORMAL HIGH (ref 70–99)
Potassium: 5.1 mmol/L (ref 3.5–5.1)
Sodium: 142 mmol/L (ref 135–145)
Total Bilirubin: 0.5 mg/dL (ref 0.3–1.2)
Total Protein: 7.1 g/dL (ref 6.5–8.1)

## 2020-02-28 LAB — URINALYSIS, COMPLETE (UACMP) WITH MICROSCOPIC
Bacteria, UA: NONE SEEN
Bilirubin Urine: NEGATIVE
Glucose, UA: NEGATIVE mg/dL
Hgb urine dipstick: NEGATIVE
Ketones, ur: NEGATIVE mg/dL
Leukocytes,Ua: NEGATIVE
Nitrite: NEGATIVE
Protein, ur: NEGATIVE mg/dL
Specific Gravity, Urine: 1.017 (ref 1.005–1.030)
pH: 5 (ref 5.0–8.0)

## 2020-02-28 MED ORDER — TRAMADOL HCL 50 MG PO TABS: 50.0000 mg | ORAL_TABLET | Freq: Four times a day (QID) | ORAL | 0 refills | Status: DC | PRN

## 2020-02-28 NOTE — Telephone Encounter (Signed)
Received call from pt with complain of left abdomin under her let ribcage x 1 week. Pt states pain is constant sharp pain that radiates to her back unrelieved by tylenol and a heating pad.  Pt states she is experiencing discomfort with urination.  Pt denies recent injury or trauma.  Per MD pt to be seen in [facility] today.  High priority message sent to scheduling.

## 2020-02-28 NOTE — Patient Instructions (Signed)

## 2020-02-28 NOTE — Telephone Encounter (Signed)
Scheduled per sch msg. Called and spoke with patient. Confirmed appt  

## 2020-03-01 ENCOUNTER — Inpatient Hospital Stay: Payer: Medicare HMO

## 2020-03-01 ENCOUNTER — Other Ambulatory Visit: Payer: Self-pay

## 2020-03-01 VITALS — BP 150/55 | HR 76 | Temp 97.7°F | Resp 18 | Wt 107.0 lb

## 2020-03-01 DIAGNOSIS — C50512 Malignant neoplasm of lower-outer quadrant of left female breast: Secondary | ICD-10-CM

## 2020-03-01 DIAGNOSIS — Z170 Estrogen receptor positive status [ER+]: Secondary | ICD-10-CM

## 2020-03-01 DIAGNOSIS — Z95828 Presence of other vascular implants and grafts: Secondary | ICD-10-CM

## 2020-03-01 DIAGNOSIS — Z5111 Encounter for antineoplastic chemotherapy: Secondary | ICD-10-CM | POA: Diagnosis not present

## 2020-03-01 LAB — CBC WITH DIFFERENTIAL (CANCER CENTER ONLY)
Abs Immature Granulocytes: 0.04 10*3/uL (ref 0.00–0.07)
Basophils Absolute: 0.1 10*3/uL (ref 0.0–0.1)
Basophils Relative: 1 %
Eosinophils Absolute: 0.2 10*3/uL (ref 0.0–0.5)
Eosinophils Relative: 7 %
HCT: 33.7 % — ABNORMAL LOW (ref 36.0–46.0)
Hemoglobin: 11 g/dL — ABNORMAL LOW (ref 12.0–15.0)
Immature Granulocytes: 1 %
Lymphocytes Relative: 30 %
Lymphs Abs: 1 10*3/uL (ref 0.7–4.0)
MCH: 30.1 pg (ref 26.0–34.0)
MCHC: 32.6 g/dL (ref 30.0–36.0)
MCV: 92.3 fL (ref 80.0–100.0)
Monocytes Absolute: 0.3 10*3/uL (ref 0.1–1.0)
Monocytes Relative: 9 %
Neutro Abs: 1.8 10*3/uL (ref 1.7–7.7)
Neutrophils Relative %: 52 %
Platelet Count: 331 10*3/uL (ref 150–400)
RBC: 3.65 MIL/uL — ABNORMAL LOW (ref 3.87–5.11)
RDW: 13.5 % (ref 11.5–15.5)
WBC Count: 3.5 10*3/uL — ABNORMAL LOW (ref 4.0–10.5)
nRBC: 0 % (ref 0.0–0.2)

## 2020-03-01 LAB — CMP (CANCER CENTER ONLY)
ALT: 15 U/L (ref 0–44)
AST: 24 U/L (ref 15–41)
Albumin: 4.2 g/dL (ref 3.5–5.0)
Alkaline Phosphatase: 73 U/L (ref 38–126)
Anion gap: 8 (ref 5–15)
BUN: 22 mg/dL (ref 8–23)
CO2: 26 mmol/L (ref 22–32)
Calcium: 9.6 mg/dL (ref 8.9–10.3)
Chloride: 104 mmol/L (ref 98–111)
Creatinine: 0.74 mg/dL (ref 0.44–1.00)
GFR, Est AFR Am: >60 mL/min (ref >60)
GFR, Estimated: >60 mL/min (ref >60)
Glucose, Bld: 106 mg/dL — ABNORMAL HIGH (ref 70–99)
Potassium: 4.5 mmol/L (ref 3.5–5.1)
Sodium: 138 mmol/L (ref 135–145)
Total Bilirubin: 0.4 mg/dL (ref 0.3–1.2)
Total Protein: 6.9 g/dL (ref 6.5–8.1)

## 2020-03-01 LAB — URINE CULTURE: Culture: <10000 — AB

## 2020-03-01 MED ORDER — HEPARIN SOD (PORK) LOCK FLUSH 100 UNIT/ML IV SOLN
500.0000 [IU] | Freq: Once | INTRAVENOUS | Status: AC | PRN
  Administered 2020-03-01: 500 [IU]
  Filled 2020-03-01: qty 5

## 2020-03-01 MED ORDER — ACETAMINOPHEN 325 MG PO TABS
650.0000 mg | ORAL_TABLET | Freq: Once | ORAL | Status: AC
  Administered 2020-03-01: 650 mg via ORAL

## 2020-03-01 MED ORDER — ACETAMINOPHEN 325 MG PO TABS
ORAL_TABLET | ORAL | Status: AC
  Filled 2020-03-01: qty 2

## 2020-03-01 MED ORDER — FAMOTIDINE IN NACL 20-0.9 MG/50ML-% IV SOLN
20.0000 mg | Freq: Once | INTRAVENOUS | Status: AC
  Administered 2020-03-01: 20 mg via INTRAVENOUS

## 2020-03-01 MED ORDER — SODIUM CHLORIDE 0.9 % IV SOLN
80.0000 mg/m2 | Freq: Once | INTRAVENOUS | Status: AC
  Administered 2020-03-01: 120 mg via INTRAVENOUS
  Filled 2020-03-01: qty 20

## 2020-03-01 MED ORDER — DIPHENHYDRAMINE HCL 50 MG/ML IJ SOLN
INTRAMUSCULAR | Status: AC
  Filled 2020-03-01: qty 1

## 2020-03-01 MED ORDER — TRASTUZUMAB-ANNS CHEMO 150 MG IV SOLR
2.0000 mg/kg | Freq: Once | INTRAVENOUS | Status: AC
  Administered 2020-03-01: 105 mg via INTRAVENOUS
  Filled 2020-03-01: qty 5

## 2020-03-01 MED ORDER — SODIUM CHLORIDE 0.9% FLUSH
10.0000 mL | INTRAVENOUS | Status: DC | PRN
  Administered 2020-03-01: 10 mL
  Filled 2020-03-01: qty 10

## 2020-03-01 MED ORDER — SODIUM CHLORIDE 0.9 % IV SOLN
Freq: Once | INTRAVENOUS | Status: AC
  Filled 2020-03-01: qty 250

## 2020-03-01 MED ORDER — DIPHENHYDRAMINE HCL 50 MG/ML IJ SOLN
25.0000 mg | Freq: Once | INTRAMUSCULAR | Status: AC
  Administered 2020-03-01: 25 mg via INTRAVENOUS

## 2020-03-01 MED ORDER — FAMOTIDINE IN NACL 20-0.9 MG/50ML-% IV SOLN
INTRAVENOUS | Status: AC
  Filled 2020-03-01: qty 50

## 2020-03-01 MED ORDER — SODIUM CHLORIDE 0.9 % IV SOLN
10.0000 mg | Freq: Once | INTRAVENOUS | Status: AC
  Administered 2020-03-01: 10 mg via INTRAVENOUS
  Filled 2020-03-01: qty 10

## 2020-03-01 MED ORDER — SODIUM CHLORIDE 0.9% FLUSH
10.0000 mL | INTRAVENOUS | Status: DC | PRN
  Administered 2020-03-01: 10 mL via INTRAVENOUS
  Filled 2020-03-01: qty 10

## 2020-03-01 NOTE — Patient Instructions (Signed)
[facility] Discharge Instructions for Patients Receiving Chemotherapy  Today you received the following chemotherapy agents: trastuzumab and paclitaxel.  To help prevent nausea and vomiting after your treatment, we encourage you to take your nausea medication as directed.   If you develop nausea and vomiting that is not controlled by your nausea medication, call the clinic.   BELOW ARE SYMPTOMS THAT SHOULD BE REPORTED IMMEDIATELY:  *FEVER GREATER THAN 100.5 F  *CHILLS WITH OR WITHOUT FEVER  NAUSEA AND VOMITING THAT IS NOT CONTROLLED WITH YOUR NAUSEA MEDICATION  *UNUSUAL SHORTNESS OF BREATH  *UNUSUAL BRUISING OR BLEEDING  TENDERNESS IN MOUTH AND THROAT WITH OR WITHOUT PRESENCE OF ULCERS  *URINARY PROBLEMS  *BOWEL PROBLEMS  UNUSUAL RASH Items with * indicate a potential emergency and should be followed up as soon as possible.  Feel free to call the clinic should you have any questions or concerns. The clinic phone number is [phone].  Please show the CHEMO ALERT CARD at check-in to the Emergency Department and triage nurse.   

## 2020-03-02 NOTE — Progress Notes (Signed)
[facility] Progress Note   Rachel Valentine [id number] Dec 19, 1940 79 y.o.  Rachel Valentine is managed by Dr. Nicholas Lose  Actively treated with chemotherapy/immunotherapy/hormonal therapy: yes  Current therapy: paclitaxel and Kanjinti  Last treated: 02/23/2020 (cycle #1, day #8)   Next scheduled appointment with provider: 03/08/2020  Assessment: Plan:    Flank pain, acute - Plan: traMADol (ULTRAM) 50 MG tablet  Malignant neoplasm of lower-outer quadrant of left breast of female, estrogen receptor positive ([place])   Left lower quadrant and left flank pain: A urinalysis and urine culture were collected today. The patient was given a prescription for tramadol.   ER positive malignant neoplasm of the left breast: The patient is managed by Dr. Lindi Adie and is status post cycle #1, day #8 of paclitaxel and Kanjinti which was dosed on 02/23/2020. She will return for follow up on 03/08/2020.  Please see After Visit Summary for patient specific instructions.  Future Appointments  Date Time Provider [place]  03/08/2020  8:30 AM [facility] LAB 2 [facility] None  03/08/2020  8:45 AM [facility] [place] FLUSH [facility] None  03/08/2020  9:15 AM Nicholas Lose, MD [facility] None  03/08/2020  9:30 AM Rolanda Jay, Phonacelle C, RN [facility] None  03/08/2020 10:15 AM [facility] INFUSION [facility] None  03/15/2020 10:00 AM [facility] LAB 6 [facility] None  03/15/2020 10:15 AM [facility] FLUSH [facility] None  03/15/2020 11:15 AM [facility] INFUSION [facility] None  03/22/2020  8:15 AM [facility] LAB 1 [facility] None  03/22/2020  8:30 AM [facility] [place] FLUSH [facility] None  03/22/2020  9:00 AM Nicholas Lose, MD [facility] None  03/22/2020  9:30 AM [facility] INFUSION [facility] None  03/29/2020  9:00 AM [facility] LAB 6 [facility] None  03/29/2020  9:15 AM [facility] [place] FLUSH [facility] None  03/29/2020 10:15 AM [facility] INFUSION [facility] None    04/18/2020 11:00 AM [name], MD [facility]    No orders of the defined types were placed in this encounter.      Subjective:   Patient ID:  Rachel Valentine is a 79 y.o. (DOB May 15, 1941) female.  Chief Complaint:  Chief Complaint  Patient presents with  . Abdominal Pain    HPI Rachel Valentine  is a 79 y.o. female with a diagnosis of an ER positive malignant neoplasm of the left breast. She is followed by Dr. Lindi Adie and is status post cycle #1, day #8 of paclitaxel and Kanjinti which was dosed on 02/23/2020. She was seen today with a report of left lower quadrant and left flank pain over the past week.  She reports that her pain is in her left lower quadrant and radiates to her back.  She has been using Tylenol and a heating pad with no relief.  She reported that she was having some discomfort with urination but is not currently having this.  She was treated with chemotherapy on 02/16/2020 and has been pushing fluids.  She reports that she had some peripheral edema.  She believes that her discomfort could be related to her drinking excess liquids.  She denies hematuria, nausea, vomiting, diarrhea, chest pain, or shortness of breath.  Medications: I have reviewed the patient's current medications.  Allergies:  Allergies  Allergen Reactions  . Fosamax [Alendronate] Nausea Only    Unset stomach  . Prednisone Other (See Comments)    Blood pressure high when she takes it  . Clindamycin/Lincomycin Rash  . Penicillins Rash    Did it involve swelling of the face/tongue/throat, SOB,  or low BP? No Did it involve sudden or severe rash/hives, skin peeling, or any reaction on the inside of your mouth or nose? No Did you need to seek medical attention at a hospital or doctor's office? Yes When did it last happen?Childhood If all above answers are "NO", may proceed with cephalosporin use.    Past Medical History:  Diagnosis Date  . Bowel  obstruction ([place])   . Cancer [facility])    dx of breast cancer 2021 - per pt in PAT 02/09/20  . Hypertension   . Pneumonia   . PONV (postoperative nausea and vomiting)     Past Surgical History:  Procedure Laterality Date  . ABDOMINAL HYSTERECTOMY    . ABDOMINAL SURGERY    . APPENDECTOMY    . AUGMENTATION MAMMAPLASTY Bilateral    implants removed in 2005  . BREAST EXCISIONAL BIOPSY Left    benign  . CORONARY ARTERY BYPASS GRAFT N/A 06/28/2019   Procedure: CORONARY ARTERY BYPASS GRAFTING (CABG) using endoscopic harvest right saphenous vein to the posterior lateral (PLB).;  Surgeon: Gaye Pollack, MD;  Location: [place] OR;  Service: Open Heart Surgery;  Laterality: N/A;  . CORONARY ATHERECTOMY N/A 01/05/2019   Procedure: CORONARY ATHERECTOMY - STENT;  Surgeon: Burnell Blanks, MD;  Location: [place] [facility] LAB;  Service: Cardiovascular;  Laterality: N/A;  . CORONARY STENT INTERVENTION N/A 01/04/2019   Procedure: CORONARY STENT INTERVENTION;  Surgeon: Leonie Man, MD;  Location: [place] CV LAB;  Service: Cardiovascular;  Laterality: N/A;  . LEFT HEART CATH AND CORONARY ANGIOGRAPHY N/A 01/04/2019   Procedure: LEFT HEART CATH AND CORONARY ANGIOGRAPHY;  Surgeon: Leonie Man, MD;  Location: [place] CV LAB;  Service: Cardiovascular;  Laterality: N/A;  . LEFT HEART CATH AND CORONARY ANGIOGRAPHY N/A 06/24/2019   Procedure: LEFT HEART CATH AND CORONARY ANGIOGRAPHY;  Surgeon: [name], MD;  Location: [place] CV LAB;  Service: Cardiovascular;  Laterality: N/A;  . PORTACATH PLACEMENT Right 02/15/2020   Procedure: INSERTION PORT-A-CATH WITH ULTRASOUND GUIDANCE;  Surgeon: [name], MD;  Location: [place];  Service: General;  Laterality: Right;  . TEE WITHOUT CARDIOVERSION N/A 06/28/2019   Procedure: TRANSESOPHAGEAL ECHOCARDIOGRAM (TEE);  Surgeon: Gaye Pollack, MD;  Location: [place];  Service: Open Heart Surgery;  Laterality: N/A;  . TEMPORARY PACEMAKER N/A 01/05/2019    Procedure: TEMPORARY PACEMAKER;  Surgeon: Burnell Blanks, MD;  Location: [place] CV LAB;  Service: Cardiovascular;  Laterality: N/A;  . TONSILLECTOMY      Family History  Problem Relation Age of Onset  . Heart failure Mother   . Stroke Father   . Stroke Brother     Social History   Socioeconomic History  . Marital status: Widowed    Spouse name: Not on file  . Number of children: Not on file  . Years of education: Not on file  . Highest education level: Not on file  Occupational History  . Not on file  Tobacco Use  . Smoking status: Former Smoker    Quit date: 01/06/1999    Years since quitting: 21.1  . Smokeless tobacco: Never Used  Vaping Use  . Vaping Use: Never used  Substance and Sexual Activity  . Alcohol use: No  . Drug use: Never  . Sexual activity: Not on file  Other Topics Concern  . Not on file  Social History Narrative  . Not on file   Social Determinants of Health   Financial Resource Strain:   .  Difficulty of Paying Living Expenses:   Food Insecurity:   . Worried About Charity fundraiser in the Last Year:   . Arboriculturist in the Last Year:   Transportation Needs:   . Film/video editor (Medical):   Marland Kitchen Lack of Transportation (Non-Medical):   Physical Activity:   . Days of Exercise per Week:   . Minutes of Exercise per Session:   Stress:   . Feeling of Stress :   Social Connections:   . Frequency of Communication with Friends and Family:   . Frequency of Social Gatherings with Friends and Family:   . Attends Religious Services:   . Active Member of Clubs or Organizations:   . Attends Archivist Meetings:   Marland Kitchen Marital Status:   Intimate Partner Violence:   . Fear of Current or Ex-Partner:   . Emotionally Abused:   Marland Kitchen Physically Abused:   . Sexually Abused:     Past Medical History, Surgical history, Social history, and Family history were reviewed and updated as appropriate.   Please see review of systems for  further details on the patient's review from today.   Review of Systems:  Review of Systems  Constitutional: Negative for chills, diaphoresis and fever.  HENT: Negative for trouble swallowing.   Respiratory: Negative for cough and shortness of breath.   Cardiovascular: Negative for chest pain, palpitations and leg swelling.  Gastrointestinal: Positive for abdominal pain. Negative for constipation, diarrhea, nausea and vomiting.  Genitourinary: Positive for dysuria and flank pain. Negative for difficulty urinating, frequency, hematuria and urgency.  Skin: Negative for rash.  Neurological: Negative for dizziness and headaches.    Objective:   Physical Exam:  BP (!) 147/60 (BP Location: Left Arm, Patient Position: Sitting)   Pulse 83   Temp 98.3 F (36.8 C) (Oral)   Resp 18   SpO2 97%  ECOG: 0  Physical Exam Constitutional:      General: She is not in acute distress.    Appearance: She is not diaphoretic.  HENT:     Head: Normocephalic and atraumatic.  Cardiovascular:     Rate and Rhythm: Normal rate and regular rhythm.     Heart sounds: Normal heart sounds. No murmur heard.  No friction rub. No gallop.   Pulmonary:     Effort: Pulmonary effort is normal. No respiratory distress.     Breath sounds: Normal breath sounds. No wheezing or rales.  Abdominal:     General: Bowel sounds are normal. There is no distension.     Palpations: Abdomen is soft.     Tenderness: There is no abdominal tenderness. There is no guarding or rebound.  Skin:    General: Skin is warm and dry.  Neurological:     Mental Status: She is alert.     Coordination: Coordination normal.  Psychiatric:        Behavior: Behavior normal.        Thought Content: Thought content normal.        Judgment: Judgment normal.     Lab Review:     Component Value Date/Time   NA 138 03/01/2020 1000   NA 142 07/13/2019 1020   K 4.5 03/01/2020 1000   CL 104 03/01/2020 1000   CO2 26 03/01/2020 1000   GLUCOSE  106 (H) 03/01/2020 1000   BUN 22 03/01/2020 1000   BUN 15 07/13/2019 1020   CREATININE 0.74 03/01/2020 1000   CALCIUM 9.6 03/01/2020 1000   PROT  6.9 03/01/2020 1000   ALBUMIN 4.2 03/01/2020 1000   AST 24 03/01/2020 1000   ALT 15 03/01/2020 1000   ALKPHOS 73 03/01/2020 1000   BILITOT 0.4 03/01/2020 1000   GFRNONAA >60 03/01/2020 1000   GFRAA >60 03/01/2020 1000       Component Value Date/Time   WBC 3.5 (L) 03/01/2020 1000   WBC 6.6 02/09/2020 0915   RBC 3.65 (L) 03/01/2020 1000   HGB 11.0 (L) 03/01/2020 1000   HGB 10.3 (L) 07/13/2019 1020   HCT 33.7 (L) 03/01/2020 1000   HCT 31.4 (L) 07/13/2019 1020   PLT 331 03/01/2020 1000   PLT 514 (H) 07/13/2019 1020   MCV 92.3 03/01/2020 1000   MCV 91 07/13/2019 1020   MCH 30.1 03/01/2020 1000   MCHC 32.6 03/01/2020 1000   RDW 13.5 03/01/2020 1000   RDW 14.5 07/13/2019 1020   LYMPHSABS 1.0 03/01/2020 1000   MONOABS 0.3 03/01/2020 1000   EOSABS 0.2 03/01/2020 1000   BASOSABS 0.1 03/01/2020 1000   -------------------------------  Imaging from last 24 hours (if applicable):  Radiology interpretation: MR BREAST BILATERAL W WO CONTRAST INC CAD  Result Date: 02/07/2020 CLINICAL DATA:  79 year old female with recently diagnosed left breast cancer. LABS:  None performed on site. EXAM: BILATERAL BREAST MRI WITH AND WITHOUT CONTRAST TECHNIQUE: Multiplanar, multisequence MR images of both breasts were obtained prior to and following the intravenous administration of 5 ml of Gadavist. Three-dimensional MR images were rendered by post-processing of the original MR data on an independent workstation. The three-dimensional MR images were interpreted, and findings are reported in the following complete MRI report for this study. Three dimensional images were evaluated at the independent DynaCad workstation COMPARISON:  Previous exam(s). FINDINGS: Breast composition: c. Heterogeneous fibroglandular tissue. Background parenchymal enhancement: Mild. Right  breast: No suspicious mass or abnormal enhancement. Left breast: Susceptibility artifact is seen in association with an irregular, enhancing mass in the central inferior left breast (series 13, image 82/112). This is consistent with the patient's biopsy-proven site of malignancy. It measures 2 x 2 x 2.3 cm. Just medial, inferior and slightly posterior to the index lesion, a second, similar appearing mass is identified (series 13, image 86/112). It measures 8 x 5 x 5 mm and demonstrates washout kinetics. No suspicious mass or abnormal enhancement in the remainder of the left breast. Lymph nodes: No abnormal appearing lymph nodes. Ancillary findings:  None. IMPRESSION: 1. 2.3 cm left breast mass consistent with the patient's biopsy-proven site of malignancy. 2. Indeterminate 8 mm enhancing mass located just medial and inferior/posterior to the index lesion (series 13, image 86). Recommendation is for further evaluation with second-look ultrasound. If no ultrasound correlate is identified, additional MRI guided biopsy is recommended. 3. No MRI evidence of malignancy on the right. 4. No suspicious lymphadenopathy. RECOMMENDATION: 1. Second look ultrasound for evaluation of an additional 8 mm mass located just medial and slightly posterior to the index lesion. Ultrasound-guided biopsy is recommended if identified. 2. If no ultrasound correlate could be identified, additional MRI guided biopsy of the left breast is recommended BI-RADS CATEGORY  4: Suspicious. Electronically Signed   By: Kristopher Oppenheim M.D.   On: 02/07/2020 12:17   DG Chest Port 1 View  Result Date: 02/15/2020 CLINICAL DATA:  Port placement EXAM: PORTABLE CHEST 1 VIEW COMPARISON:  07/28/2019 FINDINGS: Interval placement of right internal jugular approach Port-A-Cath with distal tip terminating at the level of the superior cavoatrial junction. Post CABG changes. Normal heart size. Atherosclerotic  calcification of the aortic knob. Streaky left basilar  opacity. No pleural effusion or pneumothorax. IMPRESSION: 1. Interval placement of right internal jugular approach Port-A-Cath with distal tip terminating at the level of the superior cavoatrial junction. No pneumothorax. 2. Streaky left basilar opacity which may reflect atelectasis versus infiltrate. Electronically Signed   By: Davina Poke D.O.   On: 02/15/2020 10:36   DG Fluoro Guide CV Line-No Report  Result Date: 02/15/2020 Fluoroscopy was utilized by the requesting physician.  No radiographic interpretation.   US BREAST LTD UNI LEFT INC AXILLA  Result Date: 02/10/2020 CLINICAL DATA:  79 year old patient recently diagnosed with left breast cancer following ultrasound-guided core needle biopsy of a 3.3 cm mass in the 5 o'clock axis of the left breast 3 cm from the nipple. On recent breast MRI, an 8 mm mass was identified along the medial inferior aspect of the biopsied mass, with a thin cleft of normal appearing tissue separating the 2 masses. Second-look ultrasound recommended to evaluate for this mass. The patient has her Port-A-Cath placed next Tuesday followed by chemotherapy next Wednesday. EXAM: ULTRASOUND OF THE LEFT BREAST COMPARISON:  Previous exam(s). FINDINGS: On physical exam, there is a palpable mass in the 5 o'clock axis of the left breast. Postsurgical changes of prior breast reduction. Mild bruising in the inferior left breast from recent biopsy. Median sternotomy scar noted. Targeted ultrasound is performed, showing the recently biopsied irregular heterogeneously hypoechoic mass in the 5 o'clock axis of the left breast. Careful ultrasound performed of the tissue medial to the dominant mass, with particular attention to the area just medial to the inferior aspect of the biopsy-proven carcinoma. At 6:00 to 6:30 position approximately 3 cm from the nipple, adjacent to the pectoralis muscle, is a heterogeneously hypoechoic mass measuring 0.8 x 0.4 x 0.8 cm. IMPRESSION: 0.8 cm mass in the  6 o'clock to 6:30 region of the left breast, close to the recently biopsied cancer. This is thought to correlate with the MRI finding. RECOMMENDATION: Ultrasound-guided core needle biopsy will be performed today and dictated separately. I have discussed the findings and recommendations with the patient. If applicable, a reminder letter will be sent to the patient regarding the next appointment. BI-RADS CATEGORY  4: Suspicious. Electronically Signed   By: Curlene Dolphin M.D.   On: 02/10/2020 16:07   ECHOCARDIOGRAM COMPLETE  Result Date: 02/02/2020    ECHOCARDIOGRAM REPORT   Patient Name:   GESELLE CARDOSA [facility] Date of Exam: 02/02/2020 Medical Rec #:  [id number]           Height:       62.0 in Accession #:    [id number]          Weight:       109.0 lb Date of Birth:  08-Oct-1940           BSA:          1.477 m Patient Age:    31 years            BP:           135/59 mmHg Patient Gender: F                   HR:           74 bpm. Exam Location:  Outpatient Procedure: 2D Echo, Cardiac Doppler, Color Doppler and Strain Analysis Indications:    Chemotherapy evaluation v87.41 / v58.11  History:        Patient has prior history  of Echocardiogram examinations, most                 recent 01/02/2019. CAD, Prior CABG, Signs/Symptoms:Chest Pain;                 Risk Factors:Former Smoker.  Sonographer:    Vickie Epley RDCS Referring Phys: [id number] Nicholas Lose IMPRESSIONS  1. Left ventricular ejection fraction, by estimation, is 60 to 65%. The left ventricle has normal function. The left ventricle has no regional wall motion abnormalities. Left ventricular diastolic parameters are indeterminate. The average left ventricular global longitudinal strain is -20.4 %. The global longitudinal strain is normal.  2. Right ventricular systolic function is normal. The right ventricular size is normal. There is normal pulmonary artery systolic pressure.  3. The mitral valve is normal in structure. Trivial mitral valve regurgitation. No evidence of  mitral stenosis.  4. The aortic valve is normal in structure. Aortic valve regurgitation is mild. No aortic stenosis is present.  5. The inferior vena cava is normal in size with greater than 50% respiratory variability, suggesting right atrial pressure of 3 mmHg. FINDINGS  Left Ventricle: Left ventricular ejection fraction, by estimation, is 60 to 65%. The left ventricle has normal function. The left ventricle has no regional wall motion abnormalities. The average left ventricular global longitudinal strain is -20.4 %. The global longitudinal strain is normal. The left ventricular internal cavity size was normal in size. There is no left ventricular hypertrophy. Left ventricular diastolic parameters are indeterminate. Indeterminate filling pressures. Right Ventricle: The right ventricular size is normal. No increase in right ventricular wall thickness. Right ventricular systolic function is normal. There is normal pulmonary artery systolic pressure. The tricuspid regurgitant velocity is 2.87 m/s, and  with an assumed right atrial pressure of 3 mmHg, the estimated right ventricular systolic pressure is 73.4 mmHg. Left Atrium: Left atrial size was normal in size. Right Atrium: Right atrial size was normal in size. Pericardium: There is no evidence of pericardial effusion. Mitral Valve: The mitral valve is normal in structure. Normal mobility of the mitral valve leaflets. Trivial mitral valve regurgitation. No evidence of mitral valve stenosis. Tricuspid Valve: The tricuspid valve is normal in structure. Tricuspid valve regurgitation is mild . No evidence of tricuspid stenosis. Aortic Valve: The aortic valve is normal in structure.. There is mild thickening and mild calcification of the aortic valve. Aortic valve regurgitation is mild. No aortic stenosis is present. There is mild thickening of the aortic valve. There is mild calcification of the aortic valve. Pulmonic Valve: The pulmonic valve was normal in structure.  Pulmonic valve regurgitation is not visualized. No evidence of pulmonic stenosis. Aorta: The aortic root is normal in size and structure. Venous: The inferior vena cava is normal in size with greater than 50% respiratory variability, suggesting right atrial pressure of 3 mmHg. IAS/Shunts: No atrial level shunt detected by color flow Doppler.  LEFT VENTRICLE PLAX 2D LVIDd:         4.30 cm     Diastology LVIDs:         2.90 cm     LV e' lateral:   9.73 cm/s LV PW:         0.80 cm     LV E/e' lateral: 10.0 LV IVS:        0.80 cm     LV e' medial:    6.83 cm/s LVOT diam:     2.00 cm     LV E/e' medial:  14.2  LV SV:         69 LV SV Index:   46          2D Longitudinal Strain LVOT Area:     3.14 cm    2D Strain GLS (A2C):   -21.5 %                            2D Strain GLS (A3C):   -18.4 %                            2D Strain GLS (A4C):   -21.1 % LV Volumes (MOD)           2D Strain GLS Avg:     -20.4 % LV vol d, MOD A2C: 70.9 ml LV vol d, MOD A4C: 56.2 ml LV vol s, MOD A2C: 25.3 ml LV vol s, MOD A4C: 22.5 ml LV SV MOD A2C:     45.6 ml LV SV MOD A4C:     56.2 ml LV SV MOD BP:      38.7 ml RIGHT VENTRICLE RV S prime:     7.45 cm/s TAPSE (M-mode): 1.6 cm LEFT ATRIUM             Index       RIGHT ATRIUM          Index LA diam:        3.70 cm 2.51 cm/m  RA Area:     7.17 cm LA Vol (A2C):   15.9 ml 10.76 ml/m RA Volume:   12.30 ml 8.33 ml/m LA Vol (A4C):   22.1 ml 14.96 ml/m LA Biplane Vol: 20.3 ml 13.74 ml/m  AORTIC VALVE LVOT Vmax:   92.00 cm/s LVOT Vmean:  63.600 cm/s LVOT VTI:    0.221 m  AORTA Ao Root diam: 2.90 cm MITRAL VALVE               TRICUSPID VALVE MV Area (PHT): 2.83 cm    TR Peak grad:   32.9 mmHg MV Decel Time: 268 msec    TR Vmax:        287.00 cm/s MR Peak grad: 72.9 mmHg MR Vmax:      427.00 cm/s  SHUNTS MV E velocity: 97.30 cm/s  Systemic VTI:  0.22 m MV A velocity: 80.10 cm/s  Systemic Diam: 2.00 cm MV E/A ratio:  1.21 Skeet Latch MD Electronically signed by Skeet Latch MD Signature  Date/Time: 02/02/2020/11:39:19 AM    Final    MM CLIP PLACEMENT LEFT  Result Date: 02/25/2020 CLINICAL DATA:  Status post MRI guided core biopsy of LEFT breast. EXAM: DIAGNOSTIC LEFT MAMMOGRAM POST MRI BIOPSY COMPARISON:  MRI 02/18/2020 and earlier imaging FINDINGS: Mammographic images were obtained following MRI guided biopsy of enhancing mass in the posterior LOWER INNER QUADRANT of the LEFT breast and placement of a barbell shaped clip. The biopsy marking clip is 5.4 Cm medial to the biopsied mass. IMPRESSION: Clip migration following MRI biopsy. Barbell clip is 5.4 cm medial to the biopsy site. Final Assessment: Post Procedure Mammograms for Marker Placement Electronically Signed   By: Nolon Nations M.D.   On: 02/25/2020 08:35   MM CLIP PLACEMENT LEFT  Result Date: 02/10/2020 CLINICAL DATA:  Ultrasound-guided core needle biopsy was performed of a 0.8 cm mass in the deep aspect of the left breast at 6 o'clock to 6:30 position, lower inner quadrant.  EXAM: DIAGNOSTIC LEFT MAMMOGRAM POST ULTRASOUND BIOPSY COMPARISON:  Previous exam(s)., including recent breast MRI FINDINGS: Mammographic images were obtained following ultrasound guided biopsy of the left breast. The biopsy marking clip is thought to be superficial to the biopsied area, which was adjacent to the pectoralis muscle with the patient lying supine. IMPRESSION: I suspect that the coil shaped biopsy clip migrated superficially as I withdrew the needle containing the biopsy clip today. But if the biopsy results are positive for malignancy, the coil shaped biopsy clip could be useful as providing a medial anterior margin for the preoperative localization. Final Assessment: Post Procedure Mammograms for Marker Placement Electronically Signed   By: Curlene Dolphin M.D.   On: 02/10/2020 16:16   [place] LT BREAST BX W LOC DEV 1ST LESION IMG BX SPEC US GUIDE  Addendum Date: 02/14/2020   ADDENDUM REPORT: 02/14/2020 07:49 ADDENDUM: Pathology revealed  FIBROCYSTIC CHANGE of the Left breast, 6 o'clock, 3cmfn. This was found to be concordant to explain the ultrasound finding by Dr. Curlene Dolphin. However, fibrocystic change is not thought to be concordant with the suspicious findings on breast MRI; therefore MRI-guided breast biopsy of the 0.8 cm enhancing mass is recommended if breast conservation is desired. Pathology results were discussed with the patient by telephone. The patient reported doing well after the biopsy with tenderness at the site. Post biopsy instructions and care were reviewed and questions were answered. The patient was encouraged to call [facility] for any additional concerns. My direct phone number was provided for the patient. The patient has a recent diagnosis of Left breast cancer and should follow her outlined treatment plan. The patient is scheduled for a Left breast MRI guided biopsy on February 18, 2020. Further recommendations will be guided by the results of this biopsy. Pathology results reported by Terie Purser, RN on 02/14/2020. Electronically Signed   By: Curlene Dolphin M.D.   On: 02/14/2020 07:49   Result Date: 02/14/2020 CLINICAL DATA:  Ultrasound-guided core needle biopsy was recommended of a 0.8 cm hypoechoic mass in the deep aspect of the left breast at 6 o'clock to 6:30 position 3 cm from the nipple. The patient was recently diagnosed with left breast cancer. EXAM: ULTRASOUND GUIDED LEFT BREAST CORE NEEDLE BIOPSY COMPARISON:  Previous exam(s). PROCEDURE: I met with the patient and we discussed the procedure of ultrasound-guided biopsy, including benefits and alternatives. We discussed the high likelihood of a successful procedure. We discussed the risks of the procedure, including infection, bleeding, tissue injury, clip migration, and inadequate sampling. Informed written consent was given. The usual time-out protocol was performed immediately prior to the procedure. Lesion quadrant: Lower inner  quadrant Using sterile technique and 1% Lidocaine as local anesthetic, under direct ultrasound visualization, a 14 gauge spring-loaded device was used to perform biopsy of a 0.8 cm hypoechoic mass adjacent to the pectoralis muscle and medial to the inferior aspect of the recently biopsy-proven carcinoma using a medial approach. At the conclusion of the procedure coil tissue marker clip was deployed, and it is suspected that the biopsy clip deployed superficial to the biopsied mass. Biopsy clip could not be well visualized with ultrasound. Follow up 2 view mammogram was performed and dictated separately. IMPRESSION: Ultrasound guided biopsy of the right breast. No apparent complications. Electronically Signed: By: Curlene Dolphin M.D. 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 This was found to be concordant by Dr. Nolon Nations, with excision recommended. Pathology results were discussed with the patient by telephone. The patient reported doing well after the biopsy with tenderness at the site. Post biopsy instructions and care were reviewed and questions were answered. The patient was encouraged to call [facility] for any additional concerns. The patient has a recent diagnosis of left breast cancer and should follow her outlined treatment plan. Patient is scheduled to see Dr. Nicholas Lose February 23, 2020. Pathology results reported by Stacie Acres RN on 02/22/2020. Electronically Signed   By: Nolon Nations M.D.   On: 02/22/2020 07:51   Result Date: 02/25/2020 CLINICAL DATA:  Patient presents for MR guided core biopsy of LEFT breast mass. Recent ultrasound-guided core biopsy of a 3 cm mass in the 5 o'clock location shows grade 3 invasive ductal carcinoma. A second  ultrasound-guided core biopsy of mass in the 6 o'clock location showed concordant fibrocystic changes. MRI shows a solid mass MEDIAL to the known malignancy in the LOWER INNER QUADRANT of the LEFT breast for which biopsy is recommended. EXAM: MRI GUIDED CORE NEEDLE BIOPSY OF THE LEFT BREAST TECHNIQUE: Multiplanar, multisequence MR imaging of the LEFT breast was performed both before and after administration of intravenous contrast. CONTRAST:  5 ml Gadavist COMPARISON:  Previous exams. FINDINGS: I met with the patient, and we discussed the procedure of MRI guided biopsy, including risks, benefits, and alternatives. Specifically, we discussed the risks of infection, bleeding, tissue injury, clip migration, and inadequate sampling. Informed, written consent was given. The usual time out protocol was performed immediately prior to the procedure. Using sterile technique, 1% Lidocaine, MRI guidance, and a 9 gauge vacuum assisted device, biopsy was performed of posterior LOWER INNER QUADRANT of the LEFT breast using a LATERAL to MEDIAL approach. At the conclusion of the procedure, a barbell shaped tissue marker clip was deployed into the biopsy cavity. Follow-up 2-view mammogram was performed and dictated separately. IMPRESSION: MRI guided biopsy of mass in the posterior LOWER INNER QUADRANT of the LEFT breast. No apparent complications. Electronically Signed: By: Nolon Nations M.D. On: 02/18/2020 09:58

## 2020-03-07 NOTE — Progress Notes (Signed)
Patient Care Team: [name], MD as PCP - General (Family Medicine) [name], MD as PCP - Cardiology (Cardiology) [name], MD as Surgeon (General Surgery) [name], RN as Oncology Nurse Navigator Mauro Kaufmann, RN as Oncology Nurse Navigator  DIAGNOSIS:    ICD-10-CM   1. Malignant neoplasm of lower-outer quadrant of left breast of female, estrogen receptor positive ([place])  C50.512    Z17.0     SUMMARY OF ONCOLOGIC HISTORY: Oncology History  Malignant neoplasm of lower-outer quadrant of left breast of female, estrogen receptor positive ([place])  01/18/2020 Initial Diagnosis   Palpable left breast lump with calcifications at 5 o'clock position measuring 3.3 cm.  Biopsy revealed grade 3 IDC ER 95%, PR 0%, HER-2 3+ positive, Ki-67 50%, axilla negative, T2N0   01/26/2020 Cancer Staging   Staging form: Breast, AJCC 8th Edition - Clinical stage from 01/26/2020: Stage IIA (cT2, cN0, cM0, G3, ER+, PR-, HER2+) - Signed by Nicholas Lose, MD on 01/26/2020   02/16/2020 -  Chemotherapy   The patient had PACLitaxel (TAXOL) 120 mg in sodium chloride 0.9 % 250 mL chemo infusion (</= 17m/m2), 80 mg/m2 = 120 mg, Intravenous,  Once, 1 of 3 cycles Administration: 120 mg (02/16/2020), 120 mg (02/23/2020), 120 mg (03/01/2020) trastuzumab-anns (KANJINTI) 189 mg in sodium chloride 0.9 % 250 mL chemo infusion, 4 mg/kg = 189 mg, Intravenous,  Once, 1 of 3 cycles Administration: 189 mg (02/16/2020), 105 mg (02/23/2020), 105 mg (03/01/2020)  for chemotherapy treatment.      CHIEF COMPLIANT: Cycle 4 Taxol Herceptin  INTERVAL HISTORY: Rachel Schorschis a 79y.o. with above-mentioned history of left breast cancer currently on neoadjuvant chemotherapy with Taxol Herceptin. She presents to the clinic todayfor a toxicity check and cycle 4.  ALLERGIES:  is allergic to fosamax [alendronate], prednisone, clindamycin/lincomycin, and penicillins.  MEDICATIONS:  Current  Outpatient Medications  Medication Sig Dispense Refill  . amLODipine (NORVASC) 2.5 MG tablet TAKE 1 TABLET (2.5 MG TOTAL) BY MOUTH AT BEDTIME. 90 tablet 2  . aspirin EC 81 MG tablet Take 81 mg by mouth daily.    . Biotin 10000 MCG TABS Take 10,000 mg by mouth daily.     . calcium citrate (CALCITRATE - DOSED IN MG ELEMENTAL CALCIUM) 950 (200 Ca) MG tablet Take 200 mg of elemental calcium by mouth 2 (two) times daily.     . Calcium-Phosphorus-Vitamin D (CITRACAL +D3 PO) Take 1 tablet by mouth in the morning and at bedtime.     . clopidogrel (PLAVIX) 75 MG tablet Take 1 tablet (75 mg total) by mouth daily. 90 tablet 3  . diphenhydrAMINE (BENADRYL) 25 mg capsule Take 25 mg by mouth at bedtime as needed for allergies.     .Marland Kitchenlidocaine-prilocaine (EMLA) cream Apply to affected area once 30 g 3  . LORazepam (ATIVAN) 0.5 MG tablet Take 1 tablet (0.5 mg total) by mouth every 6 (six) hours as needed (Nausea or vomiting). 30 tablet 0  . metoprolol succinate (TOPROL-XL) 25 MG 24 hr tablet Take 25 mg by mouth daily.    . nitroGLYCERIN (NITROSTAT) 0.4 MG SL tablet Place 1 tablet (0.4 mg total) under the tongue every 5 (five) minutes as needed for chest pain. 30 tablet 0  . ondansetron (ZOFRAN) 8 MG tablet Take 1 tablet (8 mg total) by mouth 2 (two) times daily as needed (Nausea or vomiting). 30 tablet 1  . oxyCODONE (OXY IR/ROXICODONE) 5 MG immediate release tablet Take 1 tablet (5  mg total) by mouth every 6 (six) hours as needed for severe pain. 15 tablet 0  . pantoprazole (PROTONIX) 40 MG tablet TAKE 1 TABLET BY MOUTH EVERY DAY (Patient taking differently: Take 40 mg by mouth daily. ) 90 tablet 2  . prochlorperazine (COMPAZINE) 10 MG tablet Take 1 tablet (10 mg total) by mouth every 6 (six) hours as needed (Nausea or vomiting). 30 tablet 1  . Propylene Glycol (SYSTANE BALANCE) 0.6 % SOLN Place 2 drops into both eyes as needed (Dry eye).    . rosuvastatin (CRESTOR) 40 MG tablet Take 40 mg by mouth daily.     .  traMADol (ULTRAM) 50 MG tablet Take 1 tablet (50 mg total) by mouth every 6 (six) hours as needed. 30 tablet 0   No current facility-administered medications for this visit.    PHYSICAL EXAMINATION: ECOG PERFORMANCE STATUS: 1 - Symptomatic but completely ambulatory  Vitals:   03/08/20 0856  BP: (!) 141/58  Pulse: 73  Resp: 17  Temp: 97.8 F (36.6 C)  SpO2: 99%   Filed Weights   03/08/20 0856  Weight: 106 lb 4.8 oz (48.2 kg)    LABORATORY DATA:  I have reviewed the data as listed CMP Latest Ref Rng & Units 03/01/2020 02/28/2020 02/23/2020  Glucose 70 - 99 mg/dL 106(H) 105(H) 102(H)  BUN 8 - 23 mg/dL _0 Creatinine 0.44 - 1.00 mg/dL 0.74 0.74 0.72  Sodium 135 - 145 mmol/L 138 142 139  Potassium 3.5 - 5.1 mmol/L 4.5 5.1 4.1  Chloride 98 - 111 mmol/L 104 105 108  CO2 22 - 32 mmol/L _1 Calcium 8.9 - 10.3 mg/dL 9.6 10.0 9.5  Total Protein 6.5 - 8.1 g/dL 6.9 7.1 6.9  Total Bilirubin 0.3 - 1.2 mg/dL 0.4 0.5 0.4  Alkaline Phos 38 - 126 U/L 73 70 80  AST 15 - 41 U/L _2 ALT 0 - 44 U/L _3 Lab Results  Component Value Date   WBC 3.1 (L) 03/08/2020   HGB 10.8 (L) 03/08/2020   HCT 33.2 (L) 03/08/2020   MCV 92.7 03/08/2020   PLT 403 (H) 03/08/2020   NEUTROABS 1.9 03/08/2020    ASSESSMENT & PLAN:  Malignant neoplasm of lower-outer quadrant of left breast of female, estrogen receptor positive ([place]) 01/18/2020:Palpable left breast lump with calcifications at 5 o'clock position measuring 3.3 cm. Biopsy revealed grade 3 IDC ER 95%, PR 0%, HER-2 3+ positive, Ki-67 50%, axilla negative, T2N0 stage IIa clinical stage Bilateral implants removed 2005  Pathology and radiology counseling: Discussed with the patient, the details of pathology including the type of breast cancer,the clinical staging, the significance of ER, PR and HER-2/neu receptors and the implications for treatment. After reviewing the pathology in detail, we proceeded to discuss the different  treatment options between surgery, radiation, chemotherapy, antiestrogen therapies.  Treatment plan: 1.Neo- adjuvant chemotherapy with Taxol Herceptin 2.Breast conserving surgery with sentinel lymph node biopsy 3.Adjuvant radiation therapy followed by 4.Adjuvant antiestrogen therapywith anastrozole 1 mg daily x5 years  Breast MRI: 2.3 cm mass, indeterminate 8 mm mass (Biopsy: ADH), no LN --------------------------------------------------------------------------------------------------------------------------------------------------------------- Current Treatment: Cycle 4 taxol-Herceptin  Chemo toxicities: Chemo induced anemia: Today's hemoglobin is 10.8 Very mild nausea but did not take any antinausea medications. Today's ANC is 1.9  Left breast mass: Patient thinks that the mass has gotten bigger in size. I would like to obtain ultrasound of the breast to evaluate this further.  If it  is truly larger than we will need to stop chemotherapy and send her for surgery.  I suspect that the increase in the size is most likely related to bleeding or bruising rather than cancer growth.  Monitoring her blood counts.  Return to clinic next week to go over the results of the ultrasound and follow-up.  No orders of the defined types were placed in this encounter.  The patient has a good understanding of the overall plan. she agrees with it. she will call with any problems that may develop before the next visit here.  Total time spent: 30 mins including face to face time and time spent for planning, charting and coordination of care  Nicholas Lose, MD 03/08/2020  I, Cloyde Reams Dorshimer, am acting as scribe for Dr. Nicholas Lose.  I have reviewed the above documentation for accuracy and completeness, and I agree with the above.

## 2020-03-08 ENCOUNTER — Other Ambulatory Visit: Payer: Self-pay

## 2020-03-08 ENCOUNTER — Inpatient Hospital Stay: Payer: Medicare HMO

## 2020-03-08 ENCOUNTER — Encounter: Payer: Self-pay | Admitting: *Deleted

## 2020-03-08 ENCOUNTER — Inpatient Hospital Stay: Payer: Medicare HMO | Attending: Hematology and Oncology | Admitting: Hematology and Oncology

## 2020-03-08 ENCOUNTER — Inpatient Hospital Stay: Payer: Medicare HMO | Admitting: *Deleted

## 2020-03-08 DIAGNOSIS — Z170 Estrogen receptor positive status [ER+]: Secondary | ICD-10-CM | POA: Insufficient documentation

## 2020-03-08 DIAGNOSIS — Z95828 Presence of other vascular implants and grafts: Secondary | ICD-10-CM

## 2020-03-08 DIAGNOSIS — Z5111 Encounter for antineoplastic chemotherapy: Secondary | ICD-10-CM | POA: Insufficient documentation

## 2020-03-08 DIAGNOSIS — C50512 Malignant neoplasm of lower-outer quadrant of left female breast: Secondary | ICD-10-CM

## 2020-03-08 DIAGNOSIS — Z79899 Other long term (current) drug therapy: Secondary | ICD-10-CM | POA: Insufficient documentation

## 2020-03-08 LAB — CBC WITH DIFFERENTIAL (CANCER CENTER ONLY)
Abs Immature Granulocytes: 0.06 10*3/uL (ref 0.00–0.07)
Basophils Absolute: 0.1 10*3/uL (ref 0.0–0.1)
Basophils Relative: 2 %
Eosinophils Absolute: 0.2 10*3/uL (ref 0.0–0.5)
Eosinophils Relative: 6 %
HCT: 33.2 % — ABNORMAL LOW (ref 36.0–46.0)
Hemoglobin: 10.8 g/dL — ABNORMAL LOW (ref 12.0–15.0)
Immature Granulocytes: 2 %
Lymphocytes Relative: 24 %
Lymphs Abs: 0.7 10*3/uL (ref 0.7–4.0)
MCH: 30.2 pg (ref 26.0–34.0)
MCHC: 32.5 g/dL (ref 30.0–36.0)
MCV: 92.7 fL (ref 80.0–100.0)
Monocytes Absolute: 0.2 10*3/uL (ref 0.1–1.0)
Monocytes Relative: 7 %
Neutro Abs: 1.9 10*3/uL (ref 1.7–7.7)
Neutrophils Relative %: 59 %
Platelet Count: 403 10*3/uL — ABNORMAL HIGH (ref 150–400)
RBC: 3.58 MIL/uL — ABNORMAL LOW (ref 3.87–5.11)
RDW: 13.7 % (ref 11.5–15.5)
WBC Count: 3.1 10*3/uL — ABNORMAL LOW (ref 4.0–10.5)
nRBC: 0 % (ref 0.0–0.2)

## 2020-03-08 LAB — CMP (CANCER CENTER ONLY)
ALT: 13 U/L (ref 0–44)
AST: 19 U/L (ref 15–41)
Albumin: 4.1 g/dL (ref 3.5–5.0)
Alkaline Phosphatase: 71 U/L (ref 38–126)
Anion gap: 8 (ref 5–15)
BUN: 23 mg/dL (ref 8–23)
CO2: 25 mmol/L (ref 22–32)
Calcium: 9.4 mg/dL (ref 8.9–10.3)
Chloride: 106 mmol/L (ref 98–111)
Creatinine: 0.77 mg/dL (ref 0.44–1.00)
GFR, Est AFR Am: >60 mL/min (ref >60)
GFR, Estimated: >60 mL/min (ref >60)
Glucose, Bld: 109 mg/dL — ABNORMAL HIGH (ref 70–99)
Potassium: 4.2 mmol/L (ref 3.5–5.1)
Sodium: 139 mmol/L (ref 135–145)
Total Bilirubin: 0.4 mg/dL (ref 0.3–1.2)
Total Protein: 6.9 g/dL (ref 6.5–8.1)

## 2020-03-08 MED ORDER — SODIUM CHLORIDE 0.9% FLUSH
10.0000 mL | INTRAVENOUS | Status: DC | PRN
  Administered 2020-03-08: 10 mL
  Filled 2020-03-08: qty 10

## 2020-03-08 MED ORDER — FAMOTIDINE IN NACL 20-0.9 MG/50ML-% IV SOLN
INTRAVENOUS | Status: AC
  Filled 2020-03-08: qty 50

## 2020-03-08 MED ORDER — SODIUM CHLORIDE 0.9 % IV SOLN
Freq: Once | INTRAVENOUS | Status: AC
  Filled 2020-03-08: qty 250

## 2020-03-08 MED ORDER — ACETAMINOPHEN 325 MG PO TABS
ORAL_TABLET | ORAL | Status: AC
  Filled 2020-03-08: qty 2

## 2020-03-08 MED ORDER — DIPHENHYDRAMINE HCL 50 MG/ML IJ SOLN
25.0000 mg | Freq: Once | INTRAMUSCULAR | Status: AC
  Administered 2020-03-08: 25 mg via INTRAVENOUS

## 2020-03-08 MED ORDER — SODIUM CHLORIDE 0.9 % IV SOLN
10.0000 mg | Freq: Once | INTRAVENOUS | Status: AC
  Administered 2020-03-08: 10 mg via INTRAVENOUS
  Filled 2020-03-08: qty 10

## 2020-03-08 MED ORDER — SODIUM CHLORIDE 0.9 % IV SOLN
80.0000 mg/m2 | Freq: Once | INTRAVENOUS | Status: AC
  Administered 2020-03-08: 120 mg via INTRAVENOUS
  Filled 2020-03-08: qty 20

## 2020-03-08 MED ORDER — DIPHENHYDRAMINE HCL 50 MG/ML IJ SOLN
INTRAMUSCULAR | Status: AC
  Filled 2020-03-08: qty 1

## 2020-03-08 MED ORDER — SODIUM CHLORIDE 0.9% FLUSH
10.0000 mL | Freq: Once | INTRAVENOUS | Status: AC
  Administered 2020-03-08: 10 mL
  Filled 2020-03-08: qty 10

## 2020-03-08 MED ORDER — ACETAMINOPHEN 325 MG PO TABS
650.0000 mg | ORAL_TABLET | Freq: Once | ORAL | Status: AC
  Administered 2020-03-08: 650 mg via ORAL

## 2020-03-08 MED ORDER — HEPARIN SOD (PORK) LOCK FLUSH 100 UNIT/ML IV SOLN
500.0000 [IU] | Freq: Once | INTRAVENOUS | Status: AC | PRN
  Administered 2020-03-08: 500 [IU]
  Filled 2020-03-08: qty 5

## 2020-03-08 MED ORDER — FAMOTIDINE IN NACL 20-0.9 MG/50ML-% IV SOLN
20.0000 mg | Freq: Once | INTRAVENOUS | Status: AC
  Administered 2020-03-08: 20 mg via INTRAVENOUS

## 2020-03-08 MED ORDER — TRASTUZUMAB-ANNS CHEMO 150 MG IV SOLR
2.0000 mg/kg | Freq: Once | INTRAVENOUS | Status: AC
  Administered 2020-03-08: 105 mg via INTRAVENOUS
  Filled 2020-03-08: qty 5

## 2020-03-08 NOTE — Patient Instructions (Signed)
[facility] Discharge Instructions for Patients Receiving Chemotherapy  Today you received the following chemotherapy agents: Kanjinti/Taxol.  To help prevent nausea and vomiting after your treatment, we encourage you to take your nausea medication as directed.   If you develop nausea and vomiting that is not controlled by your nausea medication, call the clinic.   BELOW ARE SYMPTOMS THAT SHOULD BE REPORTED IMMEDIATELY:  *FEVER GREATER THAN 100.5 F  *CHILLS WITH OR WITHOUT FEVER  NAUSEA AND VOMITING THAT IS NOT CONTROLLED WITH YOUR NAUSEA MEDICATION  *UNUSUAL SHORTNESS OF BREATH  *UNUSUAL BRUISING OR BLEEDING  TENDERNESS IN MOUTH AND THROAT WITH OR WITHOUT PRESENCE OF ULCERS  *URINARY PROBLEMS  *BOWEL PROBLEMS  UNUSUAL RASH Items with * indicate a potential emergency and should be followed up as soon as possible.  Feel free to call the clinic should you have any questions or concerns. The clinic phone number is [phone].  Please show the [facility] at check-in to the Emergency Department and triage nurse.

## 2020-03-08 NOTE — Research (Signed)
B6384, A PROSPECTIVE OBSERVATIONAL COHORT STUDY TO DEVELOP A PREDICTIVE MODEL OF TAXANE-INDUCED PERIPHERAL NEUROPATHY IN CANCER PATIENTS. Week 4 ASSESSMENTS:    PROs; Questionnaires given to patient to complete in clinic. Collected questionnaires and checked for completeness and accuracy.  Labs; Mandatory and optional research labs collected per protocol along with other standard of care labs ordered by provider.   Solicited Neuropathy Events Form; Patient denies any neuropathy symptoms. Form completed and signed by Dr. Lindi Adie. Physician Toxicity Burden Form; Completed by Dr. Lindi Adie.  Timed Get Up and Go Test; Not required this visit.  History of Falls; Not required this visit.  Concomitant Medications;  Reviewed medications with patient. She continues to take Tylenol PRN for pain once or twice a day. She has new prescriptions for Oxycodone and Tramadol on her medication list but patient states she has not taken any of them. She is not taking any Vitamin E, Glutamine, Vitamin B6, Fish Oil or Vitamin B12. Topical Agents and other Treatments Patient is using UdderCream with Urea 40% to her hands and feet to help prevent neuropathy. She is not sure if it is helping but since she hasn't developed any neuropathy symptoms she thinks it could be helping.  Patient is icing her hands and feet during her Paclitaxel infusions.   Neuropen Assessment; Completed per protocol by certified research RN, Otilio Miu with assistance recording by Carol Ada, [place].   [place] Assessment; Completed per protocol by certified research RN, Otilio Miu with assistance timing and recording by Carol Ada, [place].  Treatment:  Cycle 4 Taxol and Herceptin planned today with no treatment delays or dose reductions since treatment started.  Plan: Thanked patient for enrolling on this study today. Next study visit due in 4 weeks.  Informed patient we will schedule the study visit same day she is in clinic for treatment.   Encouraged her to call clinic if any questions or concerns prior to next appointment. She verbalized understanding.   Foye Spurling, BSN, [place]

## 2020-03-08 NOTE — Assessment & Plan Note (Signed)
01/18/2020:Palpable left breast lump with calcifications at 5 o'clock position measuring 3.3 cm. Biopsy revealed grade 3 IDC ER 95%, PR 0%, HER-2 3+ positive, Ki-67 50%, axilla negative, T2N0 stage IIa clinical stage Bilateral implants removed 2005  Pathology and radiology counseling: Discussed with the patient, the details of pathology including the type of breast cancer,the clinical staging, the significance of ER, PR and HER-2/neu receptors and the implications for treatment. After reviewing the pathology in detail, we proceeded to discuss the different treatment options between surgery, radiation, chemotherapy, antiestrogen therapies.  Treatment plan: 1.Neo- adjuvant chemotherapy with Taxol Herceptin 2.Breast conserving surgery with sentinel lymph node biopsy 3.Adjuvant radiation therapy followed by 4.Adjuvant antiestrogen therapywith anastrozole 1 mg daily x5 years  Breast MRI: 2.3 cm mass, indeterminate 8 mm mass (Biopsy: ADH), no LN --------------------------------------------------------------------------------------------------------------------------------------------------------------- Current Treatment: Cycle 4 taxol-Herceptin  Chemo toxicities: Chemo induced anemia:  Patient did not experience any side effects to chemotherapy. Bowels are moving normally.  Monitoring her blood counts.  Return to clinic weekly for chemo and every other week for follow-up with me.

## 2020-03-09 ENCOUNTER — Telehealth: Payer: Self-pay | Admitting: Hematology and Oncology

## 2020-03-09 NOTE — Telephone Encounter (Signed)
Scheduled per 7/7 los. Called and spoke with pt, confirmed appts

## 2020-03-10 ENCOUNTER — Other Ambulatory Visit: Payer: Self-pay

## 2020-03-10 ENCOUNTER — Ambulatory Visit
Admission: RE | Admit: 2020-03-10 | Discharge: 2020-03-10 | Disposition: A | Discharge status: Home / Self Care | Payer: Medicare HMO | Source: Ambulatory Visit | Attending: Hematology and Oncology | Admitting: Hematology and Oncology

## 2020-03-10 DIAGNOSIS — C50912 Malignant neoplasm of unspecified site of left female breast: Secondary | ICD-10-CM | POA: Diagnosis not present

## 2020-03-10 DIAGNOSIS — C50512 Malignant neoplasm of lower-outer quadrant of left female breast: Secondary | ICD-10-CM

## 2020-03-10 DIAGNOSIS — Z170 Estrogen receptor positive status [ER+]: Secondary | ICD-10-CM

## 2020-03-10 IMAGING — US US BREAST*L* LIMITED INC AXILLA
1 series · 13 of 14 positions shown · non-contrast
Comparison: Previous exam(s).

CLINICAL DATA: 78-year-old patient has completed 4 rounds of
neoadjuvant chemotherapy for treatment of left breast grade 3
invasive ductal carcinoma 5 o'clock position. After diagnosis of her
left breast carcinoma, she had a breast MRI showing a mass in the
lower inner posterior left breast warranting MRI guided biopsy. This
showed atypical ductal hyperplasia with calcifications. Recently,
the patient has questioned if her left breast carcinoma has
enlarged. She has had significant left breast bruising after the MRI
guided biopsy, and it is questioned if the apparent increase in size
of the mass may be due to post biopsy change. Ultrasound was ordered
for evaluation.

EXAM:
ULTRASOUND OF THE LEFT BREAST

[Series 1: us breast*left* limited inc axilla · 0.06mm/px · 13 of 14 slices shown]
[im 1/14]
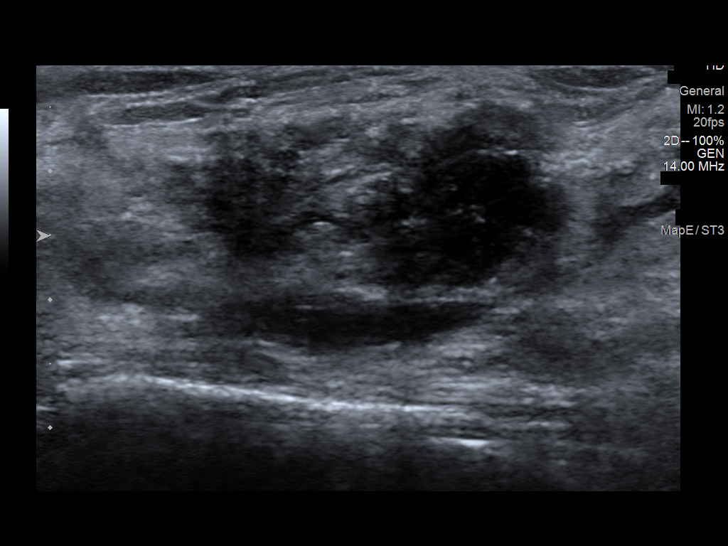
[im 2/14]
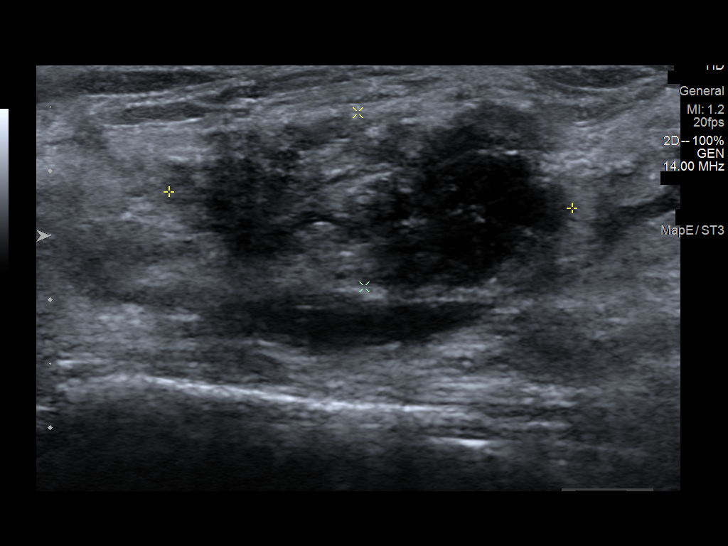
[im 3/14]
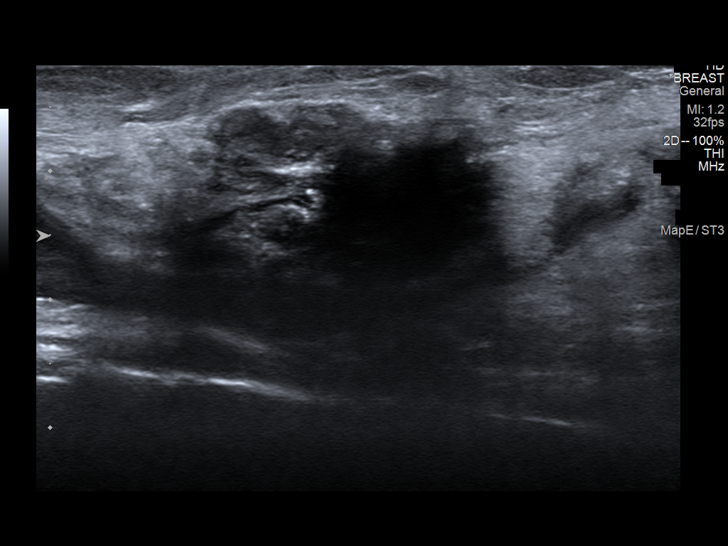
[im 4/14]
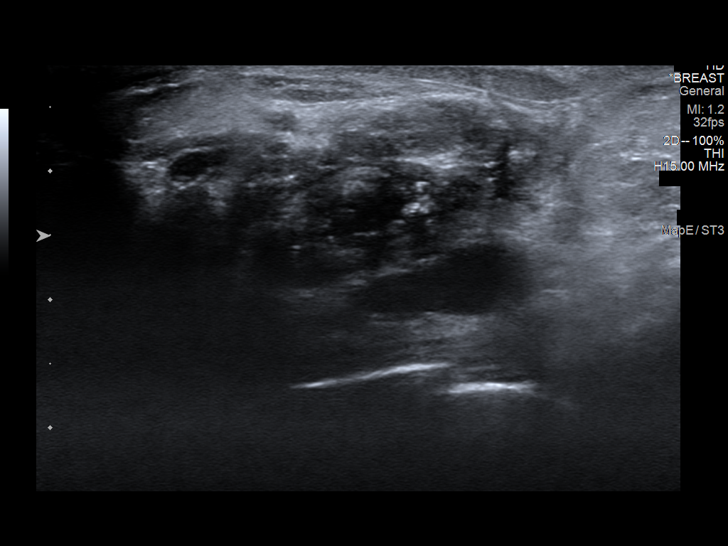
[im 5/14]
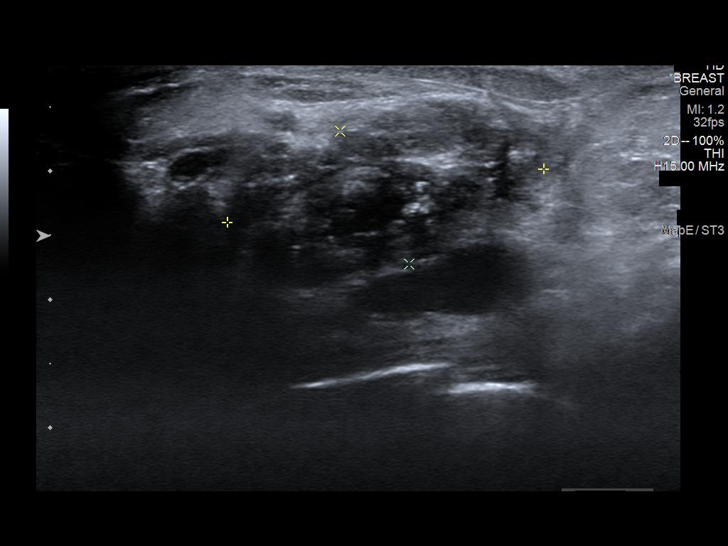
[im 6/14]
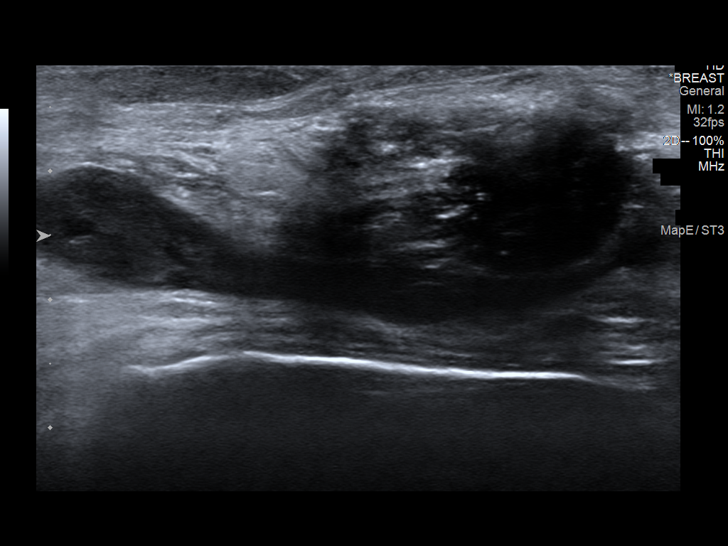
[im 8/14]
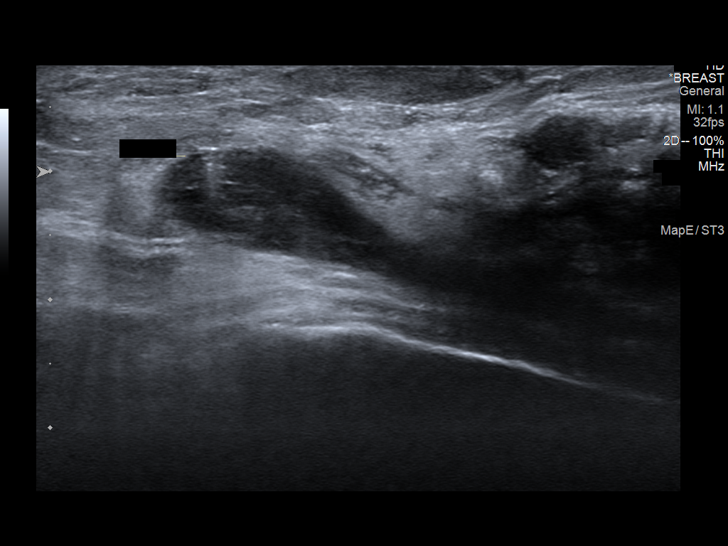
[im 9/14]
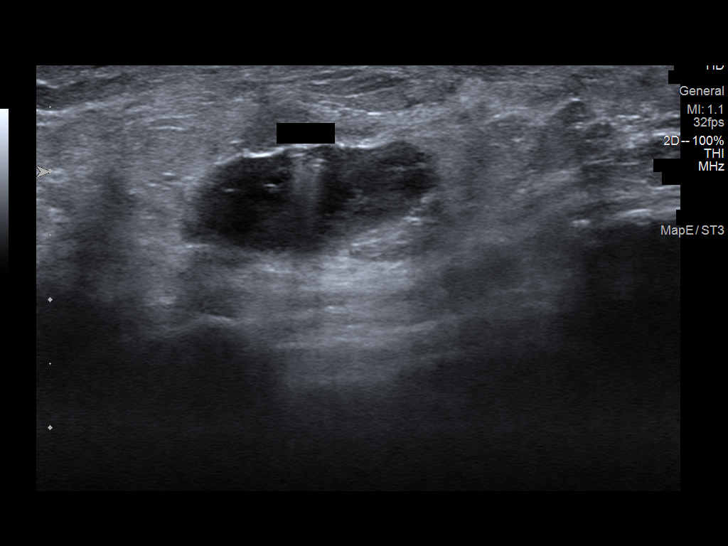
[im 10/14]
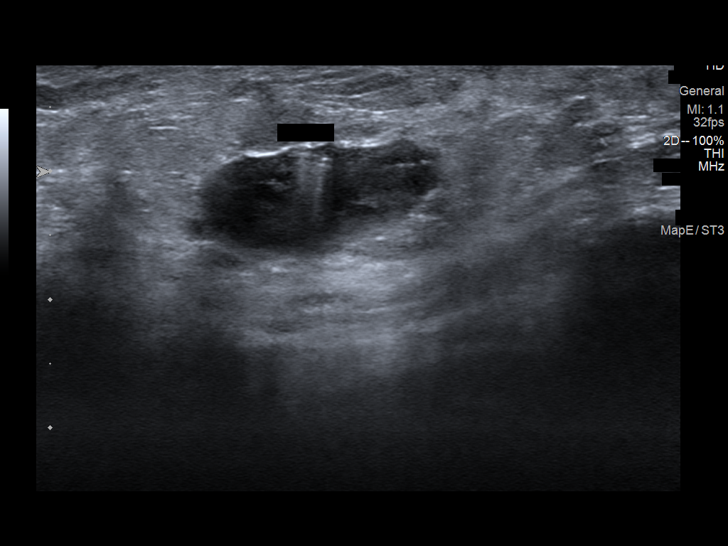
[im 11/14]
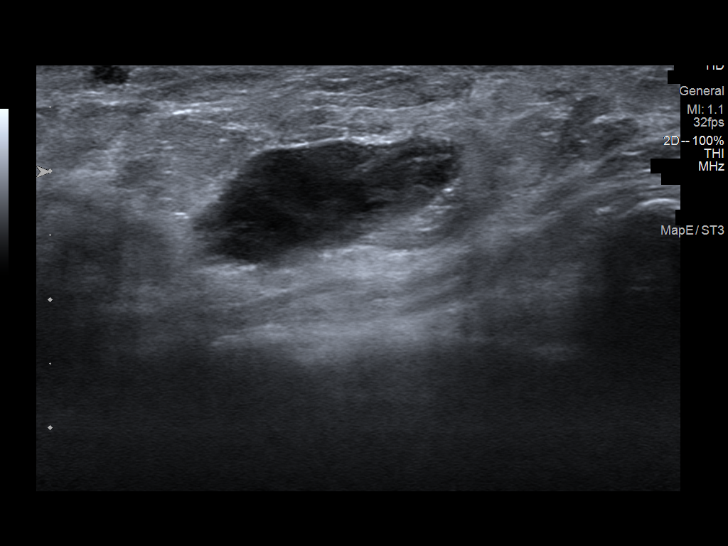
[im 12/14]
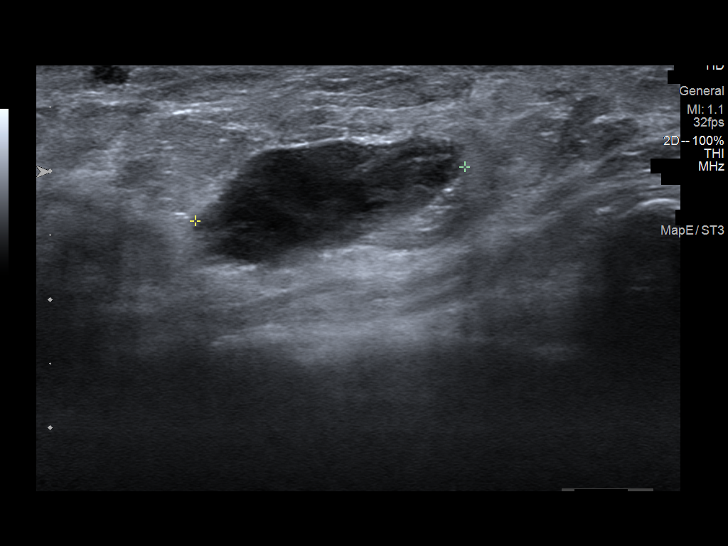
[im 13/14]
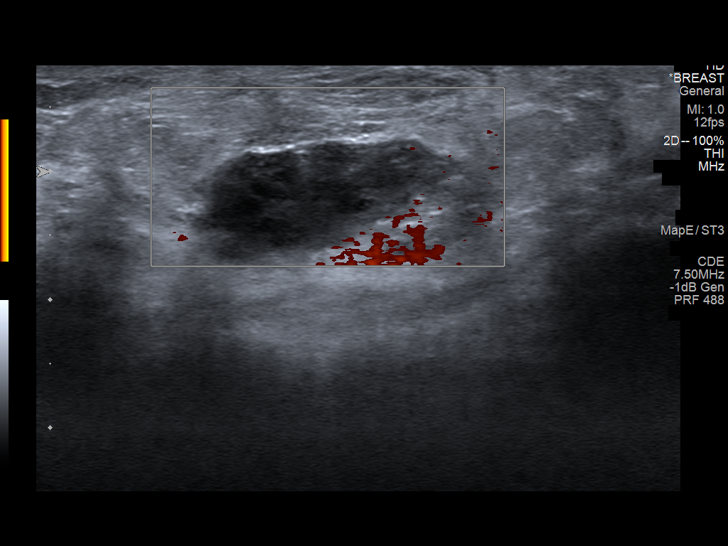
[im 14/14]
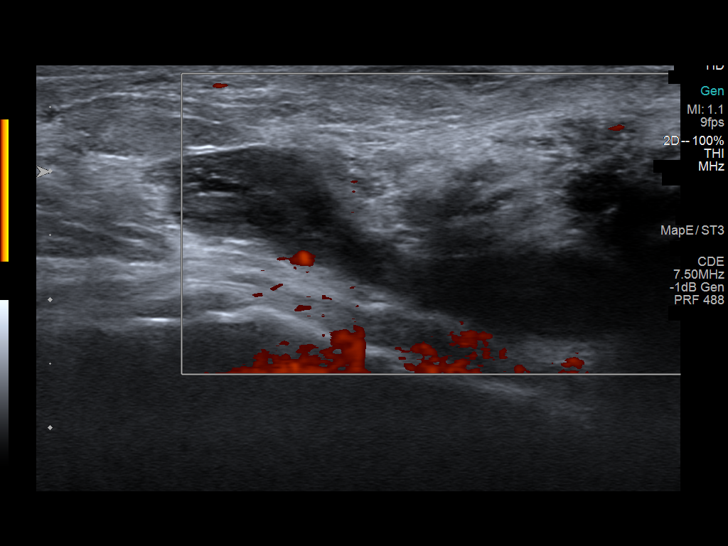

[13 of 14 positions shown; findings below may reference images not displayed]

FINDINGS: On physical exam, there is cutaneous bruising involving a large
portion of the left breast.

Targeted ultrasound is performed, showing a heterogeneously
hypoechoic mass with indistinct margins at 5 o'clock position 3 cm
from the nipple. Mass measures 3.1 x 1.4 x 2.5 cm. Previously on the
ultrasound [DATE] this mass measured 3.2 x 1.9 x 2.6 cm.

On today's ultrasound, there is a crescentic complex fluid
collection immediately deep and extending medial to the mass at 6
o'clock position. This fluid collection extends 5.0 cm in greatest
dimension and up to 0.9 cm in depth. Probable biopsy clip artifacts
seen within this fluid.
IMPRESSION: The patient is perceived increase in the biopsy-proven malignancy at
6 o'clock position left breast is due to post biopsy fluid
collection/hematoma immediately deep to and immediately adjacent to
the carcinoma.

The biopsy-proven malignancy measures slightly smaller today than it
did on [DATE].

RECOMMENDATION:
Clinical follow-up and continued treatment planning for left breast
carcinoma.

I have discussed the findings and recommendations with the patient.
If applicable, a reminder letter will be sent to the patient
regarding the next appointment.

BI-RADS CATEGORY  6: Known biopsy-proven malignancy.

## 2020-03-14 MED FILL — Dexamethasone Sodium Phosphate Inj 100 MG/10ML: INTRAMUSCULAR | Qty: 1 | Status: AC

## 2020-03-14 NOTE — Progress Notes (Signed)
Patient Care Team: [name], MD as PCP - General (Family Medicine) [name], MD as PCP - Cardiology (Cardiology) [name], MD as Surgeon (General Surgery) [name], RN as Oncology Nurse Navigator Mauro Kaufmann, RN as Oncology Nurse Navigator  DIAGNOSIS:    ICD-10-CM   1. Malignant neoplasm of lower-outer quadrant of left breast of female, estrogen receptor positive ([place])  C50.512    Z17.0     SUMMARY OF ONCOLOGIC HISTORY: Oncology History  Malignant neoplasm of lower-outer quadrant of left breast of female, estrogen receptor positive ([place])  01/18/2020 Initial Diagnosis   Palpable left breast lump with calcifications at 5 o'clock position measuring 3.3 cm.  Biopsy revealed grade 3 IDC ER 95%, PR 0%, HER-2 3+ positive, Ki-67 50%, axilla negative, T2N0   01/26/2020 Cancer Staging   Staging form: Breast, AJCC 8th Edition - Clinical stage from 01/26/2020: Stage IIA (cT2, cN0, cM0, G3, ER+, PR-, HER2+) - Signed by Nicholas Lose, MD on 01/26/2020   02/16/2020 -  Chemotherapy   The patient had PACLitaxel (TAXOL) 120 mg in sodium chloride 0.9 % 250 mL chemo infusion (</= 45m/m2), 80 mg/m2 = 120 mg, Intravenous,  Once, 1 of 3 cycles Administration: 120 mg (02/16/2020), 120 mg (02/23/2020), 120 mg (03/01/2020), 120 mg (03/08/2020) trastuzumab-anns (KANJINTI) 189 mg in sodium chloride 0.9 % 250 mL chemo infusion, 4 mg/kg = 189 mg, Intravenous,  Once, 1 of 3 cycles Administration: 189 mg (02/16/2020), 105 mg (02/23/2020), 105 mg (03/01/2020), 105 mg (03/08/2020)  for chemotherapy treatment.      CHIEF COMPLIANT: Cycle 5 Taxol Herceptin  INTERVAL HISTORY: SLillyn Valentine a 79y.o. with above-mentioned history of left breastcancer currently on neoadjuvant chemotherapy with Taxol Herceptin. Left breast [place]19 showed slight decrease in the left breast mass and a hematoma adjacent to the mass. She presents to the clinic todayfora toxicity check and  cycle 5. She developed a severe maculopapular rash on her extremities which is causing severe itching.  She is applying topical cortisone which is helped her slightly but not significantly.      ALLERGIES:  is allergic to fosamax [alendronate], prednisone, clindamycin/lincomycin, and penicillins.  MEDICATIONS:  Current Outpatient Medications  Medication Sig Dispense Refill  . amLODipine (NORVASC) 2.5 MG tablet TAKE 1 TABLET (2.5 MG TOTAL) BY MOUTH AT BEDTIME. 90 tablet 2  . aspirin EC 81 MG tablet Take 81 mg by mouth daily.    . Biotin 10000 MCG TABS Take 10,000 mg by mouth daily.     . calcium citrate (CALCITRATE - DOSED IN MG ELEMENTAL CALCIUM) 950 (200 Ca) MG tablet Take 200 mg of elemental calcium by mouth 2 (two) times daily.     . Calcium-Phosphorus-Vitamin D (CITRACAL +D3 PO) Take 1 tablet by mouth in the morning and at bedtime.     . clopidogrel (PLAVIX) 75 MG tablet Take 1 tablet (75 mg total) by mouth daily. 90 tablet 3  . diphenhydrAMINE (BENADRYL) 25 mg capsule Take 25 mg by mouth at bedtime as needed for allergies.     .Marland Kitchenlidocaine-prilocaine (EMLA) cream Apply to affected area once 30 g 3  . LORazepam (ATIVAN) 0.5 MG tablet Take 1 tablet (0.5 mg total) by mouth every 6 (six) hours as needed (Nausea or vomiting). 30 tablet 0  . metoprolol succinate (TOPROL-XL) 25 MG 24 hr tablet Take 25 mg by mouth daily.    . nitroGLYCERIN (NITROSTAT) 0.4 MG SL tablet Place 1 tablet (0.4 mg total)  under the tongue every 5 (five) minutes as needed for chest pain. 30 tablet 0  . ondansetron (ZOFRAN) 8 MG tablet Take 1 tablet (8 mg total) by mouth 2 (two) times daily as needed (Nausea or vomiting). 30 tablet 1  . oxyCODONE (OXY IR/ROXICODONE) 5 MG immediate release tablet Take 1 tablet (5 mg total) by mouth every 6 (six) hours as needed for severe pain. (Patient not taking: Reported on 03/08/2020) 15 tablet 0  . pantoprazole (PROTONIX) 40 MG tablet TAKE 1 TABLET BY MOUTH EVERY DAY (Patient taking  differently: Take 40 mg by mouth daily. ) 90 tablet 2  . prochlorperazine (COMPAZINE) 10 MG tablet Take 1 tablet (10 mg total) by mouth every 6 (six) hours as needed (Nausea or vomiting). 30 tablet 1  . Propylene Glycol (SYSTANE BALANCE) 0.6 % SOLN Place 2 drops into both eyes as needed (Dry eye).    . rosuvastatin (CRESTOR) 40 MG tablet Take 40 mg by mouth daily.     . traMADol (ULTRAM) 50 MG tablet Take 1 tablet (50 mg total) by mouth every 6 (six) hours as needed. (Patient not taking: Reported on 03/08/2020) 30 tablet 0   No current facility-administered medications for this visit.    PHYSICAL EXAMINATION: ECOG PERFORMANCE STATUS: 1 - Symptomatic but completely ambulatory  Vitals:   03/15/20 1034  BP: (!) 133/54  Pulse: 60  Resp: 18  Temp: 97.8 F (36.6 C)  SpO2: 100%   Filed Weights   03/15/20 1034  Weight: 106 lb 9.6 oz (48.4 kg)    LABORATORY DATA:  I have reviewed the data as listed CMP Latest Ref Rng & Units 03/08/2020 03/01/2020 02/28/2020  Glucose 70 - 99 mg/dL 109(H) 106(H) 105(H)  BUN 8 - 23 mg/dL _0 Creatinine 0.44 - 1.00 mg/dL 0.77 0.74 0.74  Sodium 135 - 145 mmol/L 139 138 142  Potassium 3.5 - 5.1 mmol/L 4.2 4.5 5.1  Chloride 98 - 111 mmol/L 106 104 105  CO2 22 - 32 mmol/L _1 Calcium 8.9 - 10.3 mg/dL 9.4 9.6 10.0  Total Protein 6.5 - 8.1 g/dL 6.9 6.9 7.1  Total Bilirubin 0.3 - 1.2 mg/dL 0.4 0.4 0.5  Alkaline Phos 38 - 126 U/L 71 73 70  AST 15 - 41 U/L _2 ALT 0 - 44 U/L _3 Lab Results  Component Value Date   WBC 4.6 03/15/2020   HGB 10.4 (L) 03/15/2020   HCT 32.1 (L) 03/15/2020   MCV 92.2 03/15/2020   PLT 390 03/15/2020   NEUTROABS 2.7 03/15/2020    ASSESSMENT & PLAN:  Malignant neoplasm of lower-outer quadrant of left breast of female, estrogen receptor positive ([place]) 01/18/2020:Palpable left breast lump with calcifications at 5 o'clock position measuring 3.3 cm. Biopsy revealed grade 3 IDC ER 95%, PR 0%, HER-2 3+  positive, Ki-67 50%, axilla negative, T2N0 stage IIa clinical stage Bilateral implants removed 2005  Pathology and radiology counseling: Discussed with the patient, the details of pathology including the type of breast cancer,the clinical staging, the significance of ER, PR and HER-2/neu receptors and the implications for treatment. After reviewing the pathology in detail, we proceeded to discuss the different treatment options between surgery, radiation, chemotherapy, antiestrogen therapies.  Treatment plan: 1.Neo- adjuvant chemotherapy with Taxol Herceptin 2.Breast conserving surgery with sentinel lymph node biopsy 3.Adjuvant radiation therapy followed by 4.Adjuvant antiestrogen therapywith anastrozole 1 mg daily x5 years  Breast MRI: 2.3 cm mass, indeterminate 8  mm mass (Biopsy: ADH), no LN --------------------------------------------------------------------------------------------------------------------------------------------------------------- Current Treatment: Cycle5taxol-Herceptin (Taxol discontinued because of rash)  Chemo toxicities: Chemo induced anemia: Today's hemoglobin is 10.8 Very mild nausea but did not take any antinausea medications. Today's ANC is 1.9 Fixed drug eruption: probably due to Taxol. Will D/C taxol today and give her Abraxane with her next week treatment Is that doesn't help, we may have to switch her from Biosimilar herceptin to actual herceptin  Left breast mass: Left breast ultrasound 03/10/2020: 3.1 x 1.4 x 2.5 cm mass.  Previously it was 3.2 x 1.9 x 2.6 cm.  Complex fluid collection extends 5 cm which is postbiopsy fluid collection/hematoma.  Monitoring her blood counts.  Return to clinic weekly for chemo and every other week for follow-up with me.    No orders of the defined types were placed in this encounter.  The patient has a good understanding of the overall plan. she agrees with it. she will call with any problems that may  develop before the next visit here.  Total time spent: 30 mins including face to face time and time spent for planning, charting and coordination of care  Nicholas Lose, MD 03/15/2020  I, Cloyde Reams Dorshimer, am acting as scribe for Dr. Nicholas Lose.  I have reviewed the above documentation for accuracy and completeness, and I agree with the above.

## 2020-03-15 ENCOUNTER — Inpatient Hospital Stay: Payer: Medicare HMO

## 2020-03-15 ENCOUNTER — Other Ambulatory Visit: Payer: Self-pay

## 2020-03-15 ENCOUNTER — Encounter: Payer: Self-pay | Admitting: *Deleted

## 2020-03-15 ENCOUNTER — Inpatient Hospital Stay: Payer: Medicare HMO | Admitting: Hematology and Oncology

## 2020-03-15 DIAGNOSIS — Z170 Estrogen receptor positive status [ER+]: Secondary | ICD-10-CM | POA: Diagnosis not present

## 2020-03-15 DIAGNOSIS — Z5111 Encounter for antineoplastic chemotherapy: Secondary | ICD-10-CM | POA: Diagnosis not present

## 2020-03-15 DIAGNOSIS — C50512 Malignant neoplasm of lower-outer quadrant of left female breast: Secondary | ICD-10-CM | POA: Diagnosis not present

## 2020-03-15 DIAGNOSIS — Z95828 Presence of other vascular implants and grafts: Secondary | ICD-10-CM

## 2020-03-15 LAB — CMP (CANCER CENTER ONLY)
ALT: 9 U/L (ref 0–44)
AST: 16 U/L (ref 15–41)
Albumin: 3.9 g/dL (ref 3.5–5.0)
Alkaline Phosphatase: 69 U/L (ref 38–126)
Anion gap: 10 (ref 5–15)
BUN: 27 mg/dL — ABNORMAL HIGH (ref 8–23)
CO2: 24 mmol/L (ref 22–32)
Calcium: 9.6 mg/dL (ref 8.9–10.3)
Chloride: 105 mmol/L (ref 98–111)
Creatinine: 0.66 mg/dL (ref 0.44–1.00)
GFR, Est AFR Am: >60 mL/min (ref >60)
GFR, Estimated: >60 mL/min (ref >60)
Glucose, Bld: 105 mg/dL — ABNORMAL HIGH (ref 70–99)
Potassium: 4 mmol/L (ref 3.5–5.1)
Sodium: 139 mmol/L (ref 135–145)
Total Bilirubin: 0.4 mg/dL (ref 0.3–1.2)
Total Protein: 7 g/dL (ref 6.5–8.1)

## 2020-03-15 LAB — CBC WITH DIFFERENTIAL (CANCER CENTER ONLY)
Abs Immature Granulocytes: 0.13 10*3/uL — ABNORMAL HIGH (ref 0.00–0.07)
Basophils Absolute: 0.1 10*3/uL (ref 0.0–0.1)
Basophils Relative: 1 %
Eosinophils Absolute: 0.3 10*3/uL (ref 0.0–0.5)
Eosinophils Relative: 7 %
HCT: 32.1 % — ABNORMAL LOW (ref 36.0–46.0)
Hemoglobin: 10.4 g/dL — ABNORMAL LOW (ref 12.0–15.0)
Immature Granulocytes: 3 %
Lymphocytes Relative: 23 %
Lymphs Abs: 1.1 10*3/uL (ref 0.7–4.0)
MCH: 29.9 pg (ref 26.0–34.0)
MCHC: 32.4 g/dL (ref 30.0–36.0)
MCV: 92.2 fL (ref 80.0–100.0)
Monocytes Absolute: 0.4 10*3/uL (ref 0.1–1.0)
Monocytes Relative: 8 %
Neutro Abs: 2.7 10*3/uL (ref 1.7–7.7)
Neutrophils Relative %: 58 %
Platelet Count: 390 10*3/uL (ref 150–400)
RBC: 3.48 MIL/uL — ABNORMAL LOW (ref 3.87–5.11)
RDW: 14.3 % (ref 11.5–15.5)
WBC Count: 4.6 10*3/uL (ref 4.0–10.5)
nRBC: 0 % (ref 0.0–0.2)

## 2020-03-15 MED ORDER — ACETAMINOPHEN 325 MG PO TABS
650.0000 mg | ORAL_TABLET | Freq: Once | ORAL | Status: AC
  Administered 2020-03-15: 650 mg via ORAL

## 2020-03-15 MED ORDER — SODIUM CHLORIDE 0.9% FLUSH
10.0000 mL | Freq: Once | INTRAVENOUS | Status: AC
  Administered 2020-03-15: 10 mL
  Filled 2020-03-15: qty 10

## 2020-03-15 MED ORDER — ACETAMINOPHEN 325 MG PO TABS
ORAL_TABLET | ORAL | Status: AC
  Filled 2020-03-15: qty 2

## 2020-03-15 MED ORDER — SODIUM CHLORIDE 0.9 % IV SOLN
Freq: Once | INTRAVENOUS | Status: AC
  Filled 2020-03-15: qty 250

## 2020-03-15 MED ORDER — SODIUM CHLORIDE 0.9% FLUSH
10.0000 mL | INTRAVENOUS | Status: DC | PRN
  Administered 2020-03-15: 10 mL
  Filled 2020-03-15: qty 10

## 2020-03-15 MED ORDER — DIPHENHYDRAMINE HCL 50 MG/ML IJ SOLN
INTRAMUSCULAR | Status: AC
  Filled 2020-03-15: qty 1

## 2020-03-15 MED ORDER — TRASTUZUMAB-ANNS CHEMO 150 MG IV SOLR
2.0000 mg/kg | Freq: Once | INTRAVENOUS | Status: AC
  Administered 2020-03-15: 105 mg via INTRAVENOUS
  Filled 2020-03-15: qty 5

## 2020-03-15 MED ORDER — HEPARIN SOD (PORK) LOCK FLUSH 100 UNIT/ML IV SOLN
500.0000 [IU] | Freq: Once | INTRAVENOUS | Status: AC | PRN
  Administered 2020-03-15: 500 [IU]
  Filled 2020-03-15: qty 5

## 2020-03-15 MED ORDER — DIPHENHYDRAMINE HCL 50 MG/ML IJ SOLN
25.0000 mg | Freq: Once | INTRAMUSCULAR | Status: AC
  Administered 2020-03-15: 25 mg via INTRAVENOUS

## 2020-03-15 NOTE — Assessment & Plan Note (Addendum)
01/18/2020:Palpable left breast lump with calcifications at 5 o'clock position measuring 3.3 cm. Biopsy revealed grade 3 IDC ER 95%, PR 0%, HER-2 3+ positive, Ki-67 50%, axilla negative, T2N0 stage IIa clinical stage Bilateral implants removed 2005  Pathology and radiology counseling: Discussed with the patient, the details of pathology including the type of breast cancer,the clinical staging, the significance of ER, PR and HER-2/neu receptors and the implications for treatment. After reviewing the pathology in detail, we proceeded to discuss the different treatment options between surgery, radiation, chemotherapy, antiestrogen therapies.  Treatment plan: 1.Neo- adjuvant chemotherapy with Taxol Herceptin 2.Breast conserving surgery with sentinel lymph node biopsy 3.Adjuvant radiation therapy followed by 4.Adjuvant antiestrogen therapywith anastrozole 1 mg daily x5 years  Breast MRI: 2.3 cm mass, indeterminate 8 mm mass (Biopsy: ADH), no LN --------------------------------------------------------------------------------------------------------------------------------------------------------------- Current Treatment: Cycle5taxol-Herceptin  Chemo toxicities: Chemo induced anemia: Today's hemoglobin is 10.8 Very mild nausea but did not take any antinausea medications. Today's ANC is 1.9  Left breast mass: Patient thinks that the mass has gotten bigger in size. Left breast ultrasound 03/10/2020: 3.1 x 1.4 x 2.5 cm mass.  Previously it was 3.2 x 1.9 x 2.6 cm.  Complex fluid collection extends 5 cm which is postbiopsy fluid collection/hematoma.  Monitoring her blood counts.  Return to clinic weekly for chemo and every other week for follow-up with me.

## 2020-03-15 NOTE — Patient Instructions (Signed)
[facility] Discharge Instructions for Patients Receiving Chemotherapy  Today you received the following agents: Kanjinti  To help prevent nausea and vomiting after your treatment, we encourage you to take your nausea medication as directed.   If you develop nausea and vomiting that is not controlled by your nausea medication, call the clinic.   BELOW ARE SYMPTOMS THAT SHOULD BE REPORTED IMMEDIATELY:  *FEVER GREATER THAN 100.5 F  *CHILLS WITH OR WITHOUT FEVER  NAUSEA AND VOMITING THAT IS NOT CONTROLLED WITH YOUR NAUSEA MEDICATION  *UNUSUAL SHORTNESS OF BREATH  *UNUSUAL BRUISING OR BLEEDING  TENDERNESS IN MOUTH AND THROAT WITH OR WITHOUT PRESENCE OF ULCERS  *URINARY PROBLEMS  *BOWEL PROBLEMS  UNUSUAL RASH Items with * indicate a potential emergency and should be followed up as soon as possible.  Feel free to call the clinic should you have any questions or concerns. The clinic phone number is [phone].  Please show the [place] at check-in to the Emergency Department and triage nurse.

## 2020-03-16 ENCOUNTER — Telehealth: Payer: Self-pay | Admitting: Hematology and Oncology

## 2020-03-16 NOTE — Telephone Encounter (Signed)
No 7/14 los, no changes made to pt schedule  

## 2020-03-22 ENCOUNTER — Inpatient Hospital Stay: Payer: Medicare HMO

## 2020-03-22 ENCOUNTER — Encounter: Payer: Self-pay | Admitting: *Deleted

## 2020-03-22 ENCOUNTER — Inpatient Hospital Stay: Payer: Medicare HMO | Admitting: Hematology and Oncology

## 2020-03-22 ENCOUNTER — Other Ambulatory Visit: Payer: Self-pay

## 2020-03-22 DIAGNOSIS — Z170 Estrogen receptor positive status [ER+]: Secondary | ICD-10-CM

## 2020-03-22 DIAGNOSIS — C50512 Malignant neoplasm of lower-outer quadrant of left female breast: Secondary | ICD-10-CM

## 2020-03-22 DIAGNOSIS — Z95828 Presence of other vascular implants and grafts: Secondary | ICD-10-CM

## 2020-03-22 DIAGNOSIS — Z5111 Encounter for antineoplastic chemotherapy: Secondary | ICD-10-CM | POA: Diagnosis not present

## 2020-03-22 LAB — CBC WITH DIFFERENTIAL (CANCER CENTER ONLY)
Abs Immature Granulocytes: 0.14 10*3/uL — ABNORMAL HIGH (ref 0.00–0.07)
Basophils Absolute: 0.1 10*3/uL (ref 0.0–0.1)
Basophils Relative: 1 %
Eosinophils Absolute: 0.6 10*3/uL — ABNORMAL HIGH (ref 0.0–0.5)
Eosinophils Relative: 7 %
HCT: 33.1 % — ABNORMAL LOW (ref 36.0–46.0)
Hemoglobin: 10.8 g/dL — ABNORMAL LOW (ref 12.0–15.0)
Immature Granulocytes: 2 %
Lymphocytes Relative: 15 %
Lymphs Abs: 1.2 10*3/uL (ref 0.7–4.0)
MCH: 30.3 pg (ref 26.0–34.0)
MCHC: 32.6 g/dL (ref 30.0–36.0)
MCV: 92.7 fL (ref 80.0–100.0)
Monocytes Absolute: 0.9 10*3/uL (ref 0.1–1.0)
Monocytes Relative: 12 %
Neutro Abs: 4.8 10*3/uL (ref 1.7–7.7)
Neutrophils Relative %: 63 %
Platelet Count: 421 10*3/uL — ABNORMAL HIGH (ref 150–400)
RBC: 3.57 MIL/uL — ABNORMAL LOW (ref 3.87–5.11)
RDW: 14.6 % (ref 11.5–15.5)
WBC Count: 7.7 10*3/uL (ref 4.0–10.5)
nRBC: 0 % (ref 0.0–0.2)

## 2020-03-22 LAB — CMP (CANCER CENTER ONLY)
ALT: 9 U/L (ref 0–44)
AST: 17 U/L (ref 15–41)
Albumin: 3.7 g/dL (ref 3.5–5.0)
Alkaline Phosphatase: 80 U/L (ref 38–126)
Anion gap: 9 (ref 5–15)
BUN: 20 mg/dL (ref 8–23)
CO2: 25 mmol/L (ref 22–32)
Calcium: 9.6 mg/dL (ref 8.9–10.3)
Chloride: 108 mmol/L (ref 98–111)
Creatinine: 0.78 mg/dL (ref 0.44–1.00)
GFR, Est AFR Am: >60 mL/min (ref >60)
GFR, Estimated: >60 mL/min (ref >60)
Glucose, Bld: 100 mg/dL — ABNORMAL HIGH (ref 70–99)
Potassium: 4.1 mmol/L (ref 3.5–5.1)
Sodium: 142 mmol/L (ref 135–145)
Total Bilirubin: 0.4 mg/dL (ref 0.3–1.2)
Total Protein: 6.8 g/dL (ref 6.5–8.1)

## 2020-03-22 MED ORDER — HEPARIN SOD (PORK) LOCK FLUSH 100 UNIT/ML IV SOLN
500.0000 [IU] | Freq: Once | INTRAVENOUS | Status: AC | PRN
  Administered 2020-03-22: 500 [IU]
  Filled 2020-03-22: qty 5

## 2020-03-22 MED ORDER — ACETAMINOPHEN 325 MG PO TABS
650.0000 mg | ORAL_TABLET | Freq: Once | ORAL | Status: AC
  Administered 2020-03-22: 650 mg via ORAL

## 2020-03-22 MED ORDER — TRASTUZUMAB-ANNS CHEMO 150 MG IV SOLR
2.0000 mg/kg | Freq: Once | INTRAVENOUS | Status: AC
  Administered 2020-03-22: 105 mg via INTRAVENOUS
  Filled 2020-03-22: qty 5

## 2020-03-22 MED ORDER — SODIUM CHLORIDE 0.9% FLUSH
10.0000 mL | Freq: Once | INTRAVENOUS | Status: AC
  Administered 2020-03-22: 10 mL
  Filled 2020-03-22: qty 10

## 2020-03-22 MED ORDER — DIPHENHYDRAMINE HCL 50 MG/ML IJ SOLN
25.0000 mg | Freq: Once | INTRAMUSCULAR | Status: AC
  Administered 2020-03-22: 25 mg via INTRAVENOUS

## 2020-03-22 MED ORDER — SODIUM CHLORIDE 0.9 % IV SOLN
Freq: Once | INTRAVENOUS | Status: AC
  Filled 2020-03-22: qty 250

## 2020-03-22 MED ORDER — PROCHLORPERAZINE MALEATE 10 MG PO TABS
ORAL_TABLET | ORAL | Status: AC
  Filled 2020-03-22: qty 1

## 2020-03-22 MED ORDER — PROCHLORPERAZINE MALEATE 10 MG PO TABS
10.0000 mg | ORAL_TABLET | Freq: Once | ORAL | Status: AC
  Administered 2020-03-22: 10 mg via ORAL

## 2020-03-22 MED ORDER — PACLITAXEL PROTEIN-BOUND CHEMO INJECTION 100 MG
80.0000 mg/m2 | Freq: Once | INTRAVENOUS | Status: AC
  Administered 2020-03-22: 125 mg via INTRAVENOUS
  Filled 2020-03-22: qty 25

## 2020-03-22 MED ORDER — ACETAMINOPHEN 325 MG PO TABS
ORAL_TABLET | ORAL | Status: AC
  Filled 2020-03-22: qty 2

## 2020-03-22 MED ORDER — SODIUM CHLORIDE 0.9% FLUSH
10.0000 mL | INTRAVENOUS | Status: DC | PRN
  Administered 2020-03-22: 10 mL
  Filled 2020-03-22: qty 10

## 2020-03-22 MED ORDER — DIPHENHYDRAMINE HCL 50 MG/ML IJ SOLN
INTRAMUSCULAR | Status: AC
  Filled 2020-03-22: qty 1

## 2020-03-22 NOTE — Progress Notes (Signed)
Patient Care Team: [name], MD as PCP - General (Family Medicine) [name], MD as PCP - Cardiology (Cardiology) [name], MD as Surgeon (General Surgery) [name], RN as Oncology Nurse Navigator Mauro Kaufmann, RN as Oncology Nurse Navigator  DIAGNOSIS:    ICD-10-CM   1. Malignant neoplasm of lower-outer quadrant of left breast of female, estrogen receptor positive ([place])  C50.512    Z17.0     SUMMARY OF ONCOLOGIC HISTORY: Oncology History  Malignant neoplasm of lower-outer quadrant of left breast of female, estrogen receptor positive ([place])  01/18/2020 Initial Diagnosis   Palpable left breast lump with calcifications at 5 o'clock position measuring 3.3 cm.  Biopsy revealed grade 3 IDC ER 95%, PR 0%, HER-2 3+ positive, Ki-67 50%, axilla negative, T2N0   01/26/2020 Cancer Staging   Staging form: Breast, AJCC 8th Edition - Clinical stage from 01/26/2020: Stage IIA (cT2, cN0, cM0, G3, ER+, PR-, HER2+) - Signed by Nicholas Lose, MD on 01/26/2020   02/16/2020 -  Chemotherapy   The patient had PACLitaxel-protein bound (ABRAXANE) chemo infusion 125 mg, 80 mg/m2 = 125 mg (100 % of original dose 80 mg/m2), Intravenous,  Once, 1 of 2 cycles Dose modification: 80 mg/m2 (original dose 80 mg/m2, Cycle 2) PACLitaxel (TAXOL) 120 mg in sodium chloride 0.9 % 250 mL chemo infusion (</= 3m/m2), 80 mg/m2 = 120 mg, Intravenous,  Once, 1 of 1 cycle Administration: 120 mg (02/16/2020), 120 mg (02/23/2020), 120 mg (03/01/2020), 120 mg (03/08/2020) trastuzumab-anns (KANJINTI) 189 mg in sodium chloride 0.9 % 250 mL chemo infusion, 4 mg/kg = 189 mg, Intravenous,  Once, 2 of 3 cycles Administration: 189 mg (02/16/2020), 105 mg (02/23/2020), 105 mg (03/01/2020), 105 mg (03/08/2020), 105 mg (03/15/2020)  for chemotherapy treatment.      CHIEF COMPLIANT: Cycle5Abraxane Herceptin  INTERVAL HISTORY: SDiarra Valentine a 79y.o. with above-mentioned history of left  breastcancer currently on neoadjuvant chemotherapy with Abraxane and Herceptin. She presents to the clinic todayfora toxicity checkandcycle5. The rash was bad for 3 days after last chemo but then over the weekend her symptoms are resolved.  She is no longer scratching.  She thinks that some of the rash is getting lighter.  She does have fatigue which is catching up to her.  ALLERGIES:  is allergic to paclitaxel, fosamax [alendronate], prednisone, clindamycin/lincomycin, and penicillins.  MEDICATIONS:  Current Outpatient Medications  Medication Sig Dispense Refill   amLODipine (NORVASC) 2.5 MG tablet TAKE 1 TABLET (2.5 MG TOTAL) BY MOUTH AT BEDTIME. 90 tablet 2   aspirin EC 81 MG tablet Take 81 mg by mouth daily.     Biotin 10000 MCG TABS Take 10,000 mg by mouth daily.      calcium citrate (CALCITRATE - DOSED IN MG ELEMENTAL CALCIUM) 950 (200 Ca) MG tablet Take 200 mg of elemental calcium by mouth 2 (two) times daily.      Calcium-Phosphorus-Vitamin D (CITRACAL +D3 PO) Take 1 tablet by mouth in the morning and at bedtime.      clopidogrel (PLAVIX) 75 MG tablet Take 1 tablet (75 mg total) by mouth daily. 90 tablet 3   diphenhydrAMINE (BENADRYL) 25 mg capsule Take 25 mg by mouth at bedtime as needed for allergies.      lidocaine-prilocaine (EMLA) cream Apply to affected area once 30 g 3   LORazepam (ATIVAN) 0.5 MG tablet Take 1 tablet (0.5 mg total) by mouth every 6 (six) hours as needed (Nausea or vomiting). 30 tablet 0  metoprolol succinate (TOPROL-XL) 25 MG 24 hr tablet Take 25 mg by mouth daily.     nitroGLYCERIN (NITROSTAT) 0.4 MG SL tablet Place 1 tablet (0.4 mg total) under the tongue every 5 (five) minutes as needed for chest pain. 30 tablet 0   ondansetron (ZOFRAN) 8 MG tablet Take 1 tablet (8 mg total) by mouth 2 (two) times daily as needed (Nausea or vomiting). 30 tablet 1   oxyCODONE (OXY IR/ROXICODONE) 5 MG immediate release tablet Take 1 tablet (5 mg total) by mouth  every 6 (six) hours as needed for severe pain. (Patient not taking: Reported on 03/08/2020) 15 tablet 0   pantoprazole (PROTONIX) 40 MG tablet TAKE 1 TABLET BY MOUTH EVERY DAY (Patient taking differently: Take 40 mg by mouth daily. ) 90 tablet 2   prochlorperazine (COMPAZINE) 10 MG tablet Take 1 tablet (10 mg total) by mouth every 6 (six) hours as needed (Nausea or vomiting). 30 tablet 1   Propylene Glycol (SYSTANE BALANCE) 0.6 % SOLN Place 2 drops into both eyes as needed (Dry eye).     rosuvastatin (CRESTOR) 40 MG tablet Take 40 mg by mouth daily.      traMADol (ULTRAM) 50 MG tablet Take 1 tablet (50 mg total) by mouth every 6 (six) hours as needed. (Patient not taking: Reported on 03/08/2020) 30 tablet 0   No current facility-administered medications for this visit.    PHYSICAL EXAMINATION: ECOG PERFORMANCE STATUS: 1 - Symptomatic but completely ambulatory  There were no vitals filed for this visit. There were no vitals filed for this visit.   LABORATORY DATA:  I have reviewed the data as listed CMP Latest Ref Rng & Units 03/15/2020 03/08/2020 03/01/2020  Glucose 70 - 99 mg/dL 105(H) 109(H) 106(H)  BUN 8 - 23 mg/dL 27(H) 23 22  Creatinine 0.44 - 1.00 mg/dL 0.66 0.77 0.74  Sodium 135 - 145 mmol/L 139 139 138  Potassium 3.5 - 5.1 mmol/L 4.0 4.2 4.5  Chloride 98 - 111 mmol/L 105 106 104  CO2 22 - 32 mmol/L _0 Calcium 8.9 - 10.3 mg/dL 9.6 9.4 9.6  Total Protein 6.5 - 8.1 g/dL 7.0 6.9 6.9  Total Bilirubin 0.3 - 1.2 mg/dL 0.4 0.4 0.4  Alkaline Phos 38 - 126 U/L 69 71 73  AST 15 - 41 U/L _1 ALT 0 - 44 U/L _2 Lab Results  Component Value Date   WBC 7.7 03/22/2020   HGB 10.8 (L) 03/22/2020   HCT 33.1 (L) 03/22/2020   MCV 92.7 03/22/2020   PLT 421 (H) 03/22/2020   NEUTROABS 4.8 03/22/2020    ASSESSMENT & PLAN:  Malignant neoplasm of lower-outer quadrant of left breast of female, estrogen receptor positive ([place]) 01/18/2020:Palpable left breast lump with  calcifications at 5 o'clock position measuring 3.3 cm. Biopsy revealed grade 3 IDC ER 95%, PR 0%, HER-2 3+ positive, Ki-67 50%, axilla negative, T2N0 stage IIa clinical stage Bilateral implants removed 2005  Pathology and radiology counseling: Discussed with the patient, the details of pathology including the type of breast cancer,the clinical staging, the significance of ER, PR and HER-2/neu receptors and the implications for treatment. After reviewing the pathology in detail, we proceeded to discuss the different treatment options between surgery, radiation, chemotherapy, antiestrogen therapies.  Treatment plan: 1.Neo- adjuvant chemotherapy with Taxol Herceptin 2.Breast conserving surgery with sentinel lymph node biopsy 3.Adjuvant radiation therapy followed by 4.Adjuvant antiestrogen therapywith anastrozole 1 mg daily x5 years  Breast  MRI: 2.3 cm mass, indeterminate 8 mm mass (Biopsy: ADH), no LN --------------------------------------------------------------------------------------------------------------------------------------------------------------- Current Treatment: Cycle5taxol-Herceptin (Taxol discontinued because of rash substituted with Abraxane)  Chemo toxicities: Chemo induced anemia:Today's hemoglobin is 10.8 Fatigue: Monitoring closely Today's ANC is 1.9 Fixed drug eruption: probably due to Taxol.  Taxol discontinued once starting Abraxane   is that doesn't help, we may have to switch her from Biosimilar herceptin to actual herceptin  Left breast mass: Left breast ultrasound 03/10/2020: 3.1 x 1.4 x 2.5 cm mass.  Previously it was 3.2 x 1.9 x 2.6 cm.  Complex fluid collection extends 5 cm which is postbiopsy fluid collection/hematoma.  Return to clinic weekly for every other week follow-up with me.    No orders of the defined types were placed in this encounter.  The patient has a good understanding of the overall plan. she agrees with it. she will call  with any problems that may develop before the next visit here.  Total time spent: 30 mins including face to face time and time spent for planning, charting and coordination of care  Nicholas Lose, MD 03/22/2020  I, Cloyde Reams Dorshimer, am acting as scribe for Dr. Nicholas Lose.  I have reviewed the above documentation for accuracy and completeness, and I agree with the above.

## 2020-03-22 NOTE — Assessment & Plan Note (Signed)
01/18/2020:Palpable left breast lump with calcifications at 5 o'clock position measuring 3.3 cm. Biopsy revealed grade 3 IDC ER 95%, PR 0%, HER-2 3+ positive, Ki-67 50%, axilla negative, T2N0 stage IIa clinical stage Bilateral implants removed 2005  Pathology and radiology counseling: Discussed with the patient, the details of pathology including the type of breast cancer,the clinical staging, the significance of ER, PR and HER-2/neu receptors and the implications for treatment. After reviewing the pathology in detail, we proceeded to discuss the different treatment options between surgery, radiation, chemotherapy, antiestrogen therapies.  Treatment plan: 1.Neo- adjuvant chemotherapy with Taxol Herceptin 2.Breast conserving surgery with sentinel lymph node biopsy 3.Adjuvant radiation therapy followed by 4.Adjuvant antiestrogen therapywith anastrozole 1 mg daily x5 years  Breast MRI: 2.3 cm mass, indeterminate 8 mm mass (Biopsy: ADH), no LN --------------------------------------------------------------------------------------------------------------------------------------------------------------- Current Treatment: Cycle5taxol-Herceptin (Taxol discontinued because of rash substituted with Abraxane)  Chemo toxicities: Chemo induced anemia:Today's hemoglobin is 10.8 Very mild nausea but did not take any antinausea medications. Today's ANC is 1.9 Fixed drug eruption: probably due to Taxol.  Taxol discontinued once starting Abraxane   is that doesn't help, we may have to switch her from Biosimilar herceptin to actual herceptin  Left breast mass: Left breast ultrasound 03/10/2020: 3.1 x 1.4 x 2.5 cm mass.  Previously it was 3.2 x 1.9 x 2.6 cm.  Complex fluid collection extends 5 cm which is postbiopsy fluid collection/hematoma.  Return to clinic weekly for every other week follow-up with me.

## 2020-03-22 NOTE — Patient Instructions (Signed)
[facility] Discharge Instructions for Patients Receiving Chemotherapy  Today you received the following agents: Kanjinti, Abraxane  To help prevent nausea and vomiting after your treatment, we encourage you to take your nausea medication as directed.   If you develop nausea and vomiting that is not controlled by your nausea medication, call the clinic.   BELOW ARE SYMPTOMS THAT SHOULD BE REPORTED IMMEDIATELY:  *FEVER GREATER THAN 100.5 F  *CHILLS WITH OR WITHOUT FEVER  NAUSEA AND VOMITING THAT IS NOT CONTROLLED WITH YOUR NAUSEA MEDICATION  *UNUSUAL SHORTNESS OF BREATH  *UNUSUAL BRUISING OR BLEEDING  TENDERNESS IN MOUTH AND THROAT WITH OR WITHOUT PRESENCE OF ULCERS  *URINARY PROBLEMS  *BOWEL PROBLEMS  UNUSUAL RASH Items with * indicate a potential emergency and should be followed up as soon as possible.  Feel free to call the clinic should you have any questions or concerns. The clinic phone number is [phone].  Please show the [facility] at check-in to the Emergency Department and triage nurse.  Nanoparticle Albumin-Bound Paclitaxel injection What is this medicine? NANOPARTICLE ALBUMIN-BOUND PACLITAXEL (Na no PAHR ti kuhl al BYOO muhn-bound PAK li TAX el) is a chemotherapy drug. It targets fast dividing cells, like cancer cells, and causes these cells to die. This medicine is used to treat advanced breast cancer, lung cancer, and pancreatic cancer. This medicine may be used for other purposes; ask your health care provider or pharmacist if you have questions. COMMON BRAND NAME(S): Abraxane What should I tell my health care provider before I take this medicine? They need to know if you have any of these conditions:  kidney disease  liver disease  low blood counts, like low white cell, platelet, or red cell counts  lung or breathing disease, like asthma  tingling of the fingers or toes, or other nerve disorder  an unusual or allergic  reaction to paclitaxel, albumin, other chemotherapy, other medicines, foods, dyes, or preservatives  pregnant or trying to get pregnant  breast-feeding How should I use this medicine? This drug is given as an infusion into a vein. It is administered in a hospital or clinic by a specially trained health care professional. Talk to your pediatrician regarding the use of this medicine in children. Special care may be needed. Overdosage: If you think you have taken too much of this medicine contact a poison control center or emergency room at once. NOTE: This medicine is only for you. Do not share this medicine with others. What if I miss a dose? It is important not to miss your dose. Call your doctor or health care professional if you are unable to keep an appointment. What may interact with this medicine? This medicine may interact with the following medications:  antiviral medicines for hepatitis, HIV or AIDS  certain antibiotics like erythromycin and clarithromycin  certain medicines for fungal infections like ketoconazole and itraconazole  certain medicines for seizures like carbamazepine, phenobarbital, phenytoin  gemfibrozil  nefazodone  rifampin  St. John's wort This list may not describe all possible interactions. Give your health care provider a list of all the medicines, herbs, non-prescription drugs, or dietary supplements you use. Also tell them if you smoke, drink alcohol, or use illegal drugs. Some items may interact with your medicine. What should I watch for while using this medicine? Your condition will be monitored carefully while you are receiving this medicine. You will need important blood work done while you are taking this medicine. This medicine can cause serious allergic  reactions. If you experience allergic reactions like skin rash, itching or hives, swelling of the face, lips, or tongue, tell your doctor or health care professional right away. In some cases,  you may be given additional medicines to help with side effects. Follow all directions for their use. This drug may make you feel generally unwell. This is not uncommon, as chemotherapy can affect healthy cells as well as cancer cells. Report any side effects. Continue your course of treatment even though you feel ill unless your doctor tells you to stop. Call your doctor or health care professional for advice if you get a fever, chills or sore throat, or other symptoms of a cold or flu. Do not treat yourself. This drug decreases your body's ability to fight infections. Try to avoid being around people who are sick. This medicine may increase your risk to bruise or bleed. Call your doctor or health care professional if you notice any unusual bleeding. Be careful brushing and flossing your teeth or using a toothpick because you may get an infection or bleed more easily. If you have any dental work done, tell your dentist you are receiving this medicine. Avoid taking products that contain aspirin, acetaminophen, ibuprofen, naproxen, or ketoprofen unless instructed by your doctor. These medicines may hide a fever. Do not become pregnant while taking this medicine or for 6 months after stopping it. Women should inform their doctor if they wish to become pregnant or think they might be pregnant. Men should not father a child while taking this medicine or for 3 months after stopping it. There is a potential for serious side effects to an unborn child. Talk to your health care professional or pharmacist for more information. Do not breast-feed an infant while taking this medicine or for 2 weeks after stopping it. This medicine may interfere with the ability to get pregnant or to father a child. You should talk to your doctor or health care professional if you are concerned about your fertility. What side effects may I notice from receiving this medicine? Side effects that you should report to your doctor or  health care professional as soon as possible:  allergic reactions like skin rash, itching or hives, swelling of the face, lips, or tongue  breathing problems  changes in vision  fast, irregular heartbeat  low blood pressure  mouth sores  pain, tingling, numbness in the hands or feet  signs of decreased platelets or bleeding - bruising, pinpoint red spots on the skin, black, tarry stools, blood in the urine  signs of decreased red blood cells - unusually weak or tired, feeling faint or lightheaded, falls  signs of infection - fever or chills, cough, sore throat, pain or difficulty passing urine  signs and symptoms of liver injury like dark yellow or brown urine; general ill feeling or flu-like symptoms; light-colored stools; loss of appetite; nausea; right upper belly pain; unusually weak or tired; yellowing of the eyes or skin  swelling of the ankles, feet, hands  unusually slow heartbeat Side effects that usually do not require medical attention (report to your doctor or health care professional if they continue or are bothersome):  diarrhea  hair loss  loss of appetite  nausea, vomiting  tiredness This list may not describe all possible side effects. Call your doctor for medical advice about side effects. You may report side effects to FDA at 1-800-FDA-1088. Where should I keep my medicine? This drug is given in a hospital or clinic and will not  be stored at home. NOTE: This sheet is a summary. It may not cover all possible information. If you have questions about this medicine, talk to your doctor, pharmacist, or health care provider.  2020 Elsevier/Gold Standard (2017-04-22 13:03:45)

## 2020-03-23 ENCOUNTER — Telehealth: Payer: Self-pay | Admitting: Hematology and Oncology

## 2020-03-23 NOTE — Progress Notes (Signed)
Pharmacist Chemotherapy Monitoring - Follow Up Assessment    I verify that I have reviewed each item in the below checklist:  . Regimen for the patient is scheduled for the appropriate day and plan matches scheduled date. Marland Kitchen Appropriate non-routine labs are ordered dependent on drug ordered. . If applicable, additional medications reviewed and ordered per protocol based on lifetime cumulative doses and/or treatment regimen.   Plan for follow-up and/or issues identified: Yes . I-vent associated with next due treatment: Yes . MD and/or nursing notified: No   Kennith Center, Pharm.D., CPP 03/23/2020@10 :51 AM

## 2020-03-23 NOTE — Telephone Encounter (Signed)
Scheduled per 7/21 los. Pt is aware of appts, provider appt added

## 2020-03-24 ENCOUNTER — Other Ambulatory Visit: Payer: Self-pay | Admitting: *Deleted

## 2020-03-24 DIAGNOSIS — Z006 Encounter for examination for normal comparison and control in clinical research program: Secondary | ICD-10-CM

## 2020-03-27 DIAGNOSIS — Z0000 Encounter for general adult medical examination without abnormal findings: Secondary | ICD-10-CM | POA: Diagnosis not present

## 2020-03-27 DIAGNOSIS — C50412 Malignant neoplasm of upper-outer quadrant of left female breast: Secondary | ICD-10-CM | POA: Diagnosis not present

## 2020-03-27 DIAGNOSIS — E7800 Pure hypercholesterolemia, unspecified: Secondary | ICD-10-CM | POA: Diagnosis not present

## 2020-03-27 DIAGNOSIS — I10 Essential (primary) hypertension: Secondary | ICD-10-CM | POA: Diagnosis not present

## 2020-03-27 DIAGNOSIS — I2510 Atherosclerotic heart disease of native coronary artery without angina pectoris: Secondary | ICD-10-CM | POA: Diagnosis not present

## 2020-03-27 DIAGNOSIS — E440 Moderate protein-calorie malnutrition: Secondary | ICD-10-CM | POA: Diagnosis not present

## 2020-03-28 NOTE — Progress Notes (Signed)
Patient Care Team: [name], MD as PCP - General (Family Medicine) [name], MD as PCP - Cardiology (Cardiology) [name], MD as Surgeon (General Surgery) [name], RN as Oncology Nurse Navigator Mauro Kaufmann, RN as Oncology Nurse Navigator  DIAGNOSIS:    ICD-10-CM   1. Malignant neoplasm of lower-outer quadrant of left breast of female, estrogen receptor positive ([place])  C50.512    Z17.0     SUMMARY OF ONCOLOGIC HISTORY: Oncology History  Malignant neoplasm of lower-outer quadrant of left breast of female, estrogen receptor positive ([place])  01/18/2020 Initial Diagnosis   Palpable left breast lump with calcifications at 5 o'clock position measuring 3.3 cm.  Biopsy revealed grade 3 IDC ER 95%, PR 0%, HER-2 3+ positive, Ki-67 50%, axilla negative, T2N0   01/26/2020 Cancer Staging   Staging form: Breast, AJCC 8th Edition - Clinical stage from 01/26/2020: Stage IIA (cT2, cN0, cM0, G3, ER+, PR-, HER2+) - Signed by Nicholas Lose, MD on 01/26/2020   02/16/2020 -  Chemotherapy   The patient had PACLitaxel-protein bound (ABRAXANE) chemo infusion 125 mg, 80 mg/m2 = 125 mg (100 % of original dose 80 mg/m2), Intravenous,  Once, 1 of 2 cycles Dose modification: 80 mg/m2 (original dose 80 mg/m2, Cycle 2) Administration: 125 mg (03/22/2020) PACLitaxel (TAXOL) 120 mg in sodium chloride 0.9 % 250 mL chemo infusion (</= 37m/m2), 80 mg/m2 = 120 mg, Intravenous,  Once, 1 of 1 cycle Administration: 120 mg (02/16/2020), 120 mg (02/23/2020), 120 mg (03/01/2020), 120 mg (03/08/2020) trastuzumab-anns (KANJINTI) 189 mg in sodium chloride 0.9 % 250 mL chemo infusion, 4 mg/kg = 189 mg, Intravenous,  Once, 2 of 3 cycles Administration: 189 mg (02/16/2020), 105 mg (02/23/2020), 105 mg (03/01/2020), 105 mg (03/08/2020), 105 mg (03/15/2020), 105 mg (03/22/2020)  for chemotherapy treatment.      CHIEF COMPLIANT: Cycle6Abraxane Herceptin  INTERVAL HISTORY: Rachel Valentine a  79y.o. with above-mentioned history ofleft breastcancer currently on neoadjuvant chemotherapy with Abraxane and Herceptin.She presents to the clinic todayfora toxicity checkandcycle6.  ALLERGIES:  is allergic to paclitaxel, fosamax [alendronate], prednisone, clindamycin/lincomycin, and penicillins.  MEDICATIONS:  Current Outpatient Medications  Medication Sig Dispense Refill  . amLODipine (NORVASC) 2.5 MG tablet TAKE 1 TABLET (2.5 MG TOTAL) BY MOUTH AT BEDTIME. 90 tablet 2  . aspirin EC 81 MG tablet Take 81 mg by mouth daily.    . Biotin 10000 MCG TABS Take 10,000 mg by mouth daily.     . calcium citrate (CALCITRATE - DOSED IN MG ELEMENTAL CALCIUM) 950 (200 Ca) MG tablet Take 200 mg of elemental calcium by mouth 2 (two) times daily.     . Calcium-Phosphorus-Vitamin D (CITRACAL +D3 PO) Take 1 tablet by mouth in the morning and at bedtime.     . clopidogrel (PLAVIX) 75 MG tablet Take 1 tablet (75 mg total) by mouth daily. 90 tablet 3  . diphenhydrAMINE (BENADRYL) 25 mg capsule Take 25 mg by mouth at bedtime as needed for allergies.     .Marland Kitchenlidocaine-prilocaine (EMLA) cream Apply to affected area once 30 g 3  . LORazepam (ATIVAN) 0.5 MG tablet Take 1 tablet (0.5 mg total) by mouth every 6 (six) hours as needed (Nausea or vomiting). 30 tablet 0  . metoprolol succinate (TOPROL-XL) 25 MG 24 hr tablet Take 25 mg by mouth daily.    . nitroGLYCERIN (NITROSTAT) 0.4 MG SL tablet Place 1 tablet (0.4 mg total) under the tongue every 5 (five) minutes as needed for chest  pain. 30 tablet 0  . ondansetron (ZOFRAN) 8 MG tablet Take 1 tablet (8 mg total) by mouth 2 (two) times daily as needed (Nausea or vomiting). 30 tablet 1  . oxyCODONE (OXY IR/ROXICODONE) 5 MG immediate release tablet Take 1 tablet (5 mg total) by mouth every 6 (six) hours as needed for severe pain. (Patient not taking: Reported on 03/08/2020) 15 tablet 0  . pantoprazole (PROTONIX) 40 MG tablet TAKE 1 TABLET BY MOUTH EVERY DAY (Patient  taking differently: Take 40 mg by mouth daily. ) 90 tablet 2  . prochlorperazine (COMPAZINE) 10 MG tablet Take 1 tablet (10 mg total) by mouth every 6 (six) hours as needed (Nausea or vomiting). 30 tablet 1  . Propylene Glycol (SYSTANE BALANCE) 0.6 % SOLN Place 2 drops into both eyes as needed (Dry eye).    . rosuvastatin (CRESTOR) 40 MG tablet Take 40 mg by mouth daily.     . traMADol (ULTRAM) 50 MG tablet Take 1 tablet (50 mg total) by mouth every 6 (six) hours as needed. (Patient not taking: Reported on 03/08/2020) 30 tablet 0   No current facility-administered medications for this visit.    PHYSICAL EXAMINATION: ECOG PERFORMANCE STATUS: 1 - Symptomatic but completely ambulatory  Vitals:   03/29/20 0945  BP: (!) 139/63  Pulse: 76  Resp: 17  Temp: 98.3 F (36.8 C)  SpO2: 99%   Filed Weights   03/29/20 0945  Weight: 105 lb 11.2 oz (47.9 kg)    LABORATORY DATA:  I have reviewed the data as listed CMP Latest Ref Rng & Units 03/22/2020 03/15/2020 03/08/2020  Glucose 70 - 99 mg/dL 100(H) 105(H) 109(H)  BUN 8 - 23 mg/dL 20 27(H) 23  Creatinine 0.44 - 1.00 mg/dL 0.78 0.66 0.77  Sodium 135 - 145 mmol/L 142 139 139  Potassium 3.5 - 5.1 mmol/L 4.1 4.0 4.2  Chloride 98 - 111 mmol/L 108 105 106  CO2 22 - 32 mmol/L _0 Calcium 8.9 - 10.3 mg/dL 9.6 9.6 9.4  Total Protein 6.5 - 8.1 g/dL 6.8 7.0 6.9  Total Bilirubin 0.3 - 1.2 mg/dL 0.4 0.4 0.4  Alkaline Phos 38 - 126 U/L 80 69 71  AST 15 - 41 U/L _1 ALT 0 - 44 U/L _2 Lab Results  Component Value Date   WBC 7.0 03/29/2020   HGB 10.3 (L) 03/29/2020   HCT 32.4 (L) 03/29/2020   MCV 92.0 03/29/2020   PLT 423 (H) 03/29/2020   NEUTROABS 4.2 03/29/2020    ASSESSMENT & PLAN:  Malignant neoplasm of lower-outer quadrant of left breast of female, estrogen receptor positive ([place]) 01/18/2020:Palpable left breast lump with calcifications at 5 o'clock position measuring 3.3 cm. Biopsy revealed grade 3 IDC ER 95%, PR 0%, HER-2  3+ positive, Ki-67 50%, axilla negative, T2N0 stage IIa clinical stage Bilateral implants removed 2005  Treatment plan: 1.Neo- adjuvant chemotherapy with Taxol Herceptin 2.Breast conserving surgery with sentinel lymph node biopsy 3.Adjuvant radiation therapy followed by 4.Adjuvant antiestrogen therapywith anastrozole 1 mg daily x5 years  Breast MRI: 2.3 cm mass, indeterminate 8 mm mass (Biopsy: ADH), no LN --------------------------------------------------------------------------------------------------------------------------------------------------------------- Current Treatment: Cycle5taxol-Herceptin(Taxol discontinued because of rash substituted with Abraxane)  Chemo toxicities: Chemo induced anemia:Today's hemoglobin is 10.8 Fatigue: Monitoring closely Today's ANC is 1.9 Fixed drug eruption: probably due to Taxol.  Taxol discontinued once starting Abraxane . Rash is better.  Left breast mass:Left breast ultrasound 03/10/2020: 3.1 x 1.4 x 2.5 cm mass.  Previously it was 3.2 x 1.9 x 2.6 cm. Complex fluid collection extends 5 cm which is postbiopsy fluid collection/hematoma.  Return to clinic weekly for every other week follow-up with me.    No orders of the defined types were placed in this encounter.  The patient has a good understanding of the overall plan. she agrees with it. she will call with any problems that may develop before the next visit here.  Total time spent: 30 mins including face to face time and time spent for planning, charting and coordination of care  Nicholas Lose, MD 03/29/2020  I, Cloyde Reams Dorshimer, am acting as scribe for Dr. Nicholas Lose.  I have reviewed the above documentation for accuracy and completeness, and I agree with the above.

## 2020-03-29 ENCOUNTER — Other Ambulatory Visit: Payer: Self-pay

## 2020-03-29 ENCOUNTER — Inpatient Hospital Stay: Payer: Medicare HMO | Admitting: *Deleted

## 2020-03-29 ENCOUNTER — Inpatient Hospital Stay: Payer: Medicare HMO

## 2020-03-29 ENCOUNTER — Other Ambulatory Visit: Payer: Self-pay | Admitting: Hematology and Oncology

## 2020-03-29 ENCOUNTER — Inpatient Hospital Stay: Payer: Medicare HMO | Admitting: Hematology and Oncology

## 2020-03-29 DIAGNOSIS — Z170 Estrogen receptor positive status [ER+]: Secondary | ICD-10-CM

## 2020-03-29 DIAGNOSIS — Z006 Encounter for examination for normal comparison and control in clinical research program: Secondary | ICD-10-CM

## 2020-03-29 DIAGNOSIS — Z5111 Encounter for antineoplastic chemotherapy: Secondary | ICD-10-CM | POA: Diagnosis not present

## 2020-03-29 DIAGNOSIS — C50512 Malignant neoplasm of lower-outer quadrant of left female breast: Secondary | ICD-10-CM

## 2020-03-29 DIAGNOSIS — Z95828 Presence of other vascular implants and grafts: Secondary | ICD-10-CM

## 2020-03-29 LAB — CBC WITH DIFFERENTIAL (CANCER CENTER ONLY)
Abs Immature Granulocytes: 0.09 10*3/uL — ABNORMAL HIGH (ref 0.00–0.07)
Basophils Absolute: 0.1 10*3/uL (ref 0.0–0.1)
Basophils Relative: 1 %
Eosinophils Absolute: 0.9 10*3/uL — ABNORMAL HIGH (ref 0.0–0.5)
Eosinophils Relative: 14 %
HCT: 32.4 % — ABNORMAL LOW (ref 36.0–46.0)
Hemoglobin: 10.3 g/dL — ABNORMAL LOW (ref 12.0–15.0)
Immature Granulocytes: 1 %
Lymphocytes Relative: 18 %
Lymphs Abs: 1.2 10*3/uL (ref 0.7–4.0)
MCH: 29.3 pg (ref 26.0–34.0)
MCHC: 31.8 g/dL (ref 30.0–36.0)
MCV: 92 fL (ref 80.0–100.0)
Monocytes Absolute: 0.4 10*3/uL (ref 0.1–1.0)
Monocytes Relative: 6 %
Neutro Abs: 4.2 10*3/uL (ref 1.7–7.7)
Neutrophils Relative %: 60 %
Platelet Count: 423 10*3/uL — ABNORMAL HIGH (ref 150–400)
RBC: 3.52 MIL/uL — ABNORMAL LOW (ref 3.87–5.11)
RDW: 14.6 % (ref 11.5–15.5)
WBC Count: 7 10*3/uL (ref 4.0–10.5)
nRBC: 0 % (ref 0.0–0.2)

## 2020-03-29 LAB — CMP (CANCER CENTER ONLY)
ALT: 8 U/L (ref 0–44)
AST: 18 U/L (ref 15–41)
Albumin: 3.9 g/dL (ref 3.5–5.0)
Alkaline Phosphatase: 74 U/L (ref 38–126)
Anion gap: 7 (ref 5–15)
BUN: 16 mg/dL (ref 8–23)
CO2: 28 mmol/L (ref 22–32)
Calcium: 10.1 mg/dL (ref 8.9–10.3)
Chloride: 105 mmol/L (ref 98–111)
Creatinine: 0.71 mg/dL (ref 0.44–1.00)
GFR, Est AFR Am: >60 mL/min (ref >60)
GFR, Estimated: >60 mL/min (ref >60)
Glucose, Bld: 93 mg/dL (ref 70–99)
Potassium: 4.4 mmol/L (ref 3.5–5.1)
Sodium: 140 mmol/L (ref 135–145)
Total Bilirubin: 0.3 mg/dL (ref 0.3–1.2)
Total Protein: 6.8 g/dL (ref 6.5–8.1)

## 2020-03-29 LAB — RESEARCH LABS

## 2020-03-29 MED ORDER — DIPHENHYDRAMINE HCL 50 MG/ML IJ SOLN
INTRAMUSCULAR | Status: AC
  Filled 2020-03-29: qty 1

## 2020-03-29 MED ORDER — PROCHLORPERAZINE MALEATE 10 MG PO TABS
10.0000 mg | ORAL_TABLET | Freq: Once | ORAL | Status: AC
  Administered 2020-03-29: 10 mg via ORAL

## 2020-03-29 MED ORDER — ACETAMINOPHEN 325 MG PO TABS
ORAL_TABLET | ORAL | Status: AC
  Filled 2020-03-29: qty 2

## 2020-03-29 MED ORDER — SODIUM CHLORIDE 0.9 % IV SOLN
Freq: Once | INTRAVENOUS | Status: AC
  Filled 2020-03-29: qty 250

## 2020-03-29 MED ORDER — ACETAMINOPHEN 325 MG PO TABS
650.0000 mg | ORAL_TABLET | Freq: Once | ORAL | Status: AC
  Administered 2020-03-29: 650 mg via ORAL

## 2020-03-29 MED ORDER — PACLITAXEL PROTEIN-BOUND CHEMO INJECTION 100 MG
80.0000 mg/m2 | Freq: Once | INTRAVENOUS | Status: AC
  Administered 2020-03-29: 125 mg via INTRAVENOUS
  Filled 2020-03-29: qty 25

## 2020-03-29 MED ORDER — DIPHENHYDRAMINE HCL 50 MG/ML IJ SOLN
25.0000 mg | Freq: Once | INTRAMUSCULAR | Status: AC
  Administered 2020-03-29: 25 mg via INTRAVENOUS

## 2020-03-29 MED ORDER — PROCHLORPERAZINE MALEATE 10 MG PO TABS
ORAL_TABLET | ORAL | Status: AC
  Filled 2020-03-29: qty 1

## 2020-03-29 MED ORDER — SODIUM CHLORIDE 0.9% FLUSH
10.0000 mL | Freq: Once | INTRAVENOUS | Status: AC
  Administered 2020-03-29: 10 mL
  Filled 2020-03-29: qty 10

## 2020-03-29 MED ORDER — TRASTUZUMAB-ANNS CHEMO 150 MG IV SOLR
2.0000 mg/kg | Freq: Once | INTRAVENOUS | Status: AC
  Administered 2020-03-29: 105 mg via INTRAVENOUS
  Filled 2020-03-29: qty 5

## 2020-03-29 MED ORDER — HEPARIN SOD (PORK) LOCK FLUSH 100 UNIT/ML IV SOLN
500.0000 [IU] | Freq: Once | INTRAVENOUS | Status: AC | PRN
  Administered 2020-03-29: 500 [IU]
  Filled 2020-03-29: qty 5

## 2020-03-29 MED ORDER — SODIUM CHLORIDE 0.9% FLUSH
10.0000 mL | INTRAVENOUS | Status: DC | PRN
  Administered 2020-03-29: 10 mL
  Filled 2020-03-29: qty 10

## 2020-03-29 NOTE — Research (Signed)
D3267, A PROSPECTIVE OBSERVATIONAL COHORT STUDY TO DEVELOP A PREDICTIVE MODEL OF TAXANE-INDUCED PERIPHERAL NEUROPATHY IN CANCER PATIENTS. Week 8 ASSESSMENTS:    PROs; Questionnaires given to patient to complete in clinic. Collected questionnaires and checked for completeness and accuracy.  Labs; Mandatory and optional research labs collected per protocol along with other standard of care labs ordered by provider.   Solicited Neuropathy Events Form; Patient denies any neuropathy symptoms. Form completed and signed by Dr. Lindi Adie. Physician Toxicity Burden Form; Completed by Dr. Lindi Adie.  Timed Get Up and Go Test; Not required this visit.  History of Falls; Not required this visit.  Concomitant Medications;  Reviewed medications with patient. She continues to take Tylenol PRN for pain once or twice a day. She reports she has not taken any Oxycodone and Tramadol and does not need them. Medication list updated. She is not taking any Vitamin E, Glutamine, Vitamin B6, Fish Oil or Vitamin B12. Topical Agents and other Treatments Patient is using UdderCream with Urea 40% to her hands and feet to help prevent neuropathy. She is not sure if it is helping but since she hasn't developed any neuropathy symptoms she thinks it could be helping.  Patient also continued to ice her hands and feet during her Paclitaxel infusions to help prevent neuropathy.  Patient has not used any complementary or alternative medicines since starting treatment.  Neuropen Assessment; Completed per protocol by this certified research RN with assistance recording by Carol Ada, [place].   [place] Assessment; Completed per protocol by this certified research RN with assistance timing and recording by Carol Ada, [place].  Treatment:  Taxol was held on 03/15/20 after 4 cycles due to a rash.  Taxol was then discontinued and changed to Abraxane on 03/22/20 for cycle 5 since the rash was thought to be a reaction to Taxol. Patient received a total  of 480 mg Taxol prior to discontinuation. Patient continues with 2nd treatment of Abraxane and Herceptin for cycle 6 today.   Plan: Thanked patient for enrolling on this study today. Next study visit due in 4 weeks.  Informed patient we will schedule the study visit same day she is in clinic for treatment.  Encouraged her to call clinic if any questions or concerns prior to next appointment. She verbalized understanding.   Foye Spurling, BSN, [place]

## 2020-03-29 NOTE — Patient Instructions (Signed)

## 2020-03-29 NOTE — Patient Instructions (Signed)
[facility] Discharge Instructions for Patients Receiving Chemotherapy  Today you received the following chemotherapy agents trastuzumab and abraxane  To help prevent nausea and vomiting after your treatment, we encourage you to take your nausea medication as directed   If you develop nausea and vomiting that is not controlled by your nausea medication, call the clinic.   BELOW ARE SYMPTOMS THAT SHOULD BE REPORTED IMMEDIATELY:  *FEVER GREATER THAN 100.5 F  *CHILLS WITH OR WITHOUT FEVER  NAUSEA AND VOMITING THAT IS NOT CONTROLLED WITH YOUR NAUSEA MEDICATION  *UNUSUAL SHORTNESS OF BREATH  *UNUSUAL BRUISING OR BLEEDING  TENDERNESS IN MOUTH AND THROAT WITH OR WITHOUT PRESENCE OF ULCERS  *URINARY PROBLEMS  *BOWEL PROBLEMS  UNUSUAL RASH Items with * indicate a potential emergency and should be followed up as soon as possible.  Feel free to call the clinic should you have any questions or concerns. The clinic phone number is [phone].  Please show the CHEMO ALERT CARD at check-in to the Emergency Department and triage nurse.   

## 2020-03-29 NOTE — Assessment & Plan Note (Signed)
01/18/2020:Palpable left breast lump with calcifications at 5 o'clock position measuring 3.3 cm. Biopsy revealed grade 3 IDC ER 95%, PR 0%, HER-2 3+ positive, Ki-67 50%, axilla negative, T2N0 stage IIa clinical stage Bilateral implants removed 2005  Treatment plan: 1.Neo- adjuvant chemotherapy with Taxol Herceptin 2.Breast conserving surgery with sentinel lymph node biopsy 3.Adjuvant radiation therapy followed by 4.Adjuvant antiestrogen therapywith anastrozole 1 mg daily x5 years  Breast MRI: 2.3 cm mass, indeterminate 8 mm mass (Biopsy: ADH), no LN --------------------------------------------------------------------------------------------------------------------------------------------------------------- Current Treatment: Cycle5taxol-Herceptin(Taxol discontinued because of rash substituted with Abraxane)  Chemo toxicities: Chemo induced anemia:Today's hemoglobin is 10.8 Fatigue: Monitoring closely Today's ANC is 1.9 Fixed drug eruption: probably due to Taxol.  Taxol discontinued once starting Abraxane   If that doesn't help, we may have to switch her from Biosimilar herceptin to actual herceptin  Left breast mass:Left breast ultrasound 03/10/2020: 3.1 x 1.4 x 2.5 cm mass. Previously it was 3.2 x 1.9 x 2.6 cm. Complex fluid collection extends 5 cm which is postbiopsy fluid collection/hematoma.  Return to clinic weekly for every other week follow-up with me.

## 2020-03-30 ENCOUNTER — Telehealth: Payer: Self-pay | Admitting: Hematology and Oncology

## 2020-03-30 NOTE — Telephone Encounter (Signed)
No 7/28 los, no changes made to pt schedule

## 2020-03-31 ENCOUNTER — Telehealth: Payer: Self-pay | Admitting: *Deleted

## 2020-03-31 ENCOUNTER — Other Ambulatory Visit: Payer: Self-pay

## 2020-03-31 ENCOUNTER — Inpatient Hospital Stay: Payer: Medicare HMO

## 2020-03-31 DIAGNOSIS — R319 Hematuria, unspecified: Secondary | ICD-10-CM

## 2020-03-31 DIAGNOSIS — Z5111 Encounter for antineoplastic chemotherapy: Secondary | ICD-10-CM | POA: Diagnosis not present

## 2020-03-31 LAB — URINALYSIS, COMPLETE (UACMP) WITH MICROSCOPIC
Bilirubin Urine: NEGATIVE
Glucose, UA: 50 mg/dL — AB
Ketones, ur: NEGATIVE mg/dL
Leukocytes,Ua: NEGATIVE
Nitrite: NEGATIVE
Protein, ur: 100 mg/dL — AB
RBC / HPF: >50 RBC/hpf — ABNORMAL HIGH (ref 0–5)
Specific Gravity, Urine: 1.004 — ABNORMAL LOW (ref 1.005–1.030)
pH: 6 (ref 5.0–8.0)

## 2020-03-31 MED ORDER — CIPROFLOXACIN HCL 250 MG PO TABS: 250.0000 mg | ORAL_TABLET | Freq: Two times a day (BID) | ORAL | 0 refills | Status: DC

## 2020-03-31 NOTE — Telephone Encounter (Signed)
Per urine sample- per " blood in urine " onset post chemo - prescription for Cipro sent to verified pharmacy per patient notification.

## 2020-03-31 NOTE — Telephone Encounter (Signed)
Pt called to state onset of blood in urine post treatment on 7/28- she states " I had this happen before when I was on a cardiac medication "  She denies any burning or clots. She has low back pain " but I usually do after I get my chemotherapy"  Pt will come in now for a U/A with nurse review for further recommendations

## 2020-04-01 ENCOUNTER — Emergency Department (HOSPITAL_COMMUNITY)
Admission: EM | Admit: 2020-04-01 | Discharge: 2020-04-01 | Disposition: A | Discharge status: Home / Self Care | Payer: Medicare HMO | Attending: Emergency Medicine | Admitting: Emergency Medicine

## 2020-04-01 ENCOUNTER — Encounter (HOSPITAL_COMMUNITY): Payer: Self-pay | Admitting: Emergency Medicine

## 2020-04-01 ENCOUNTER — Other Ambulatory Visit: Payer: Self-pay

## 2020-04-01 DIAGNOSIS — M545 Low back pain: Secondary | ICD-10-CM | POA: Diagnosis not present

## 2020-04-01 DIAGNOSIS — Z5321 Procedure and treatment not carried out due to patient leaving prior to being seen by health care provider: Secondary | ICD-10-CM | POA: Insufficient documentation

## 2020-04-01 DIAGNOSIS — R110 Nausea: Secondary | ICD-10-CM | POA: Insufficient documentation

## 2020-04-01 LAB — URINE CULTURE: Culture: <10000 — AB

## 2020-04-01 NOTE — ED Notes (Signed)
Patient left and stated she is tried of waiting to be seen and she is going home.

## 2020-04-01 NOTE — ED Triage Notes (Signed)
Patient is a breast cancer patient. Patient comes in because she started lower right back pain. Patient states it started after she took cipro. Patient states she threw up at home and took phenergan. Patient states she did not what to do.

## 2020-04-02 DIAGNOSIS — N12 Tubulo-interstitial nephritis, not specified as acute or chronic: Secondary | ICD-10-CM | POA: Diagnosis not present

## 2020-04-02 DIAGNOSIS — N200 Calculus of kidney: Secondary | ICD-10-CM | POA: Diagnosis not present

## 2020-04-02 DIAGNOSIS — R109 Unspecified abdominal pain: Secondary | ICD-10-CM | POA: Diagnosis not present

## 2020-04-05 NOTE — Progress Notes (Signed)
Patient Care Team: [name], MD as PCP - General (Family Medicine) [name], MD as PCP - Cardiology (Cardiology) [name], MD as Surgeon (General Surgery) [name], RN as Oncology Nurse Navigator Mauro Kaufmann, RN as Oncology Nurse Navigator  DIAGNOSIS:    ICD-10-CM   1. Malignant neoplasm of lower-outer quadrant of left breast of female, estrogen receptor positive ([place])  C50.512    Z17.0     SUMMARY OF ONCOLOGIC HISTORY: Oncology History  Malignant neoplasm of lower-outer quadrant of left breast of female, estrogen receptor positive ([place])  01/18/2020 Initial Diagnosis   Palpable left breast lump with calcifications at 5 o'clock position measuring 3.3 cm.  Biopsy revealed grade 3 IDC ER 95%, PR 0%, HER-2 3+ positive, Ki-67 50%, axilla negative, T2N0   01/26/2020 Cancer Staging   Staging form: Breast, AJCC 8th Edition - Clinical stage from 01/26/2020: Stage IIA (cT2, cN0, cM0, G3, ER+, PR-, HER2+) - Signed by Nicholas Lose, MD on 01/26/2020   02/16/2020 -  Chemotherapy   The patient had PACLitaxel-protein bound (ABRAXANE) chemo infusion 125 mg, 80 mg/m2 = 125 mg (100 % of original dose 80 mg/m2), Intravenous,  Once, 1 of 2 cycles Dose modification: 80 mg/m2 (original dose 80 mg/m2, Cycle 2) Administration: 125 mg (03/22/2020), 125 mg (03/29/2020) PACLitaxel (TAXOL) 120 mg in sodium chloride 0.9 % 250 mL chemo infusion (</= 85m/m2), 80 mg/m2 = 120 mg, Intravenous,  Once, 1 of 1 cycle Administration: 120 mg (02/16/2020), 120 mg (02/23/2020), 120 mg (03/01/2020), 120 mg (03/08/2020) trastuzumab-anns (KANJINTI) 189 mg in sodium chloride 0.9 % 250 mL chemo infusion, 4 mg/kg = 189 mg, Intravenous,  Once, 2 of 3 cycles Administration: 189 mg (02/16/2020), 105 mg (02/23/2020), 105 mg (03/01/2020), 105 mg (03/08/2020), 105 mg (03/15/2020), 105 mg (03/22/2020), 105 mg (03/29/2020)  for chemotherapy treatment.      CHIEF COMPLIANT: Cycle7 AbraxaneHerceptin  INTERVAL  HISTORY: SShallen Luedkeis a 79y.o. with above-mentioned history of left breastcancer currently on neoadjuvant chemotherapy withAbraxane andHerceptin.She presents to the clinic todayfora toxicity checkandcycle 7.  She was recently in the emergency room because of hematuria and pain in the flanks.  She went to urgent care on Sunday.  The hematuria was waxing and waning.  She was seen by symptom management and was placed on ciprofloxacin.  She tells me that she recently had some blood clots and that the amount of blood in the urine appears to be lightening up.  She was also nauseated on Saturday.  She is otherwise tolerating the chemo extremely well.  ALLERGIES:  is allergic to paclitaxel, fosamax [alendronate], prednisone, clindamycin/lincomycin, and penicillins.  MEDICATIONS:  Current Outpatient Medications  Medication Sig Dispense Refill  . aspirin EC 81 MG tablet Take 81 mg by mouth daily.    . Biotin 10000 MCG TABS Take 10,000 mg by mouth daily.  (Patient not taking: Reported on 03/29/2020)    . calcium citrate (CALCITRATE - DOSED IN MG ELEMENTAL CALCIUM) 950 (200 Ca) MG tablet Take 200 mg of elemental calcium by mouth 2 (two) times daily.     . Calcium-Phosphorus-Vitamin D (CITRACAL +D3 PO) Take 1 tablet by mouth in the morning and at bedtime.     . ciprofloxacin (CIPRO) 250 MG tablet Take 1 tablet (250 mg total) by mouth 2 (two) times daily. 14 tablet 0  . clopidogrel (PLAVIX) 75 MG tablet Take 1 tablet (75 mg total) by mouth daily. 90 tablet 3  . diphenhydrAMINE (BENADRYL) 25 mg  capsule Take 25 mg by mouth at bedtime as needed for allergies.     Marland Kitchen lidocaine-prilocaine (EMLA) cream Apply to affected area once 30 g 3  . LORazepam (ATIVAN) 0.5 MG tablet Take 1 tablet (0.5 mg total) by mouth every 6 (six) hours as needed (Nausea or vomiting). 30 tablet 0  . metoprolol succinate (TOPROL-XL) 25 MG 24 hr tablet Take 25 mg by mouth daily.    . nitroGLYCERIN (NITROSTAT) 0.4 MG SL  tablet Place 1 tablet (0.4 mg total) under the tongue every 5 (five) minutes as needed for chest pain. 30 tablet 0  . ondansetron (ZOFRAN) 8 MG tablet Take 1 tablet (8 mg total) by mouth 2 (two) times daily as needed (Nausea or vomiting). 30 tablet 1  . pantoprazole (PROTONIX) 40 MG tablet TAKE 1 TABLET BY MOUTH EVERY DAY (Patient taking differently: Take 40 mg by mouth daily. ) 90 tablet 2  . prochlorperazine (COMPAZINE) 10 MG tablet Take 1 tablet (10 mg total) by mouth every 6 (six) hours as needed (Nausea or vomiting). 30 tablet 1  . Propylene Glycol (SYSTANE BALANCE) 0.6 % SOLN Place 2 drops into both eyes as needed (Dry eye).    . rosuvastatin (CRESTOR) 40 MG tablet Take 40 mg by mouth daily.      No current facility-administered medications for this visit.    PHYSICAL EXAMINATION: ECOG PERFORMANCE STATUS: 1 - Symptomatic but completely ambulatory  Vitals:   04/06/20 1117  BP: (!) 152/60  Pulse: 98  Resp: 18  Temp: 98.3 F (36.8 C)  SpO2: 100%   Filed Weights   04/06/20 1117  Weight: 105 lb 4.8 oz (47.8 kg)    LABORATORY DATA:  I have reviewed the data as listed CMP Latest Ref Rng & Units 03/29/2020 03/22/2020 03/15/2020  Glucose 70 - 99 mg/dL 93 100(H) 105(H)  BUN 8 - 23 mg/dL 16 20 27(H)  Creatinine 0.44 - 1.00 mg/dL 0.71 0.78 0.66  Sodium 135 - 145 mmol/L 140 142 139  Potassium 3.5 - 5.1 mmol/L 4.4 4.1 4.0  Chloride 98 - 111 mmol/L 105 108 105  CO2 22 - 32 mmol/L 28 25 24   Calcium 8.9 - 10.3 mg/dL 10.1 9.6 9.6  Total Protein 6.5 - 8.1 g/dL 6.8 6.8 7.0  Total Bilirubin 0.3 - 1.2 mg/dL 0.3 0.4 0.4  Alkaline Phos 38 - 126 U/L 74 80 69  AST 15 - 41 U/L 18 17 16   ALT 0 - 44 U/L 8 9 9     Lab Results  Component Value Date   WBC 4.9 04/06/2020   HGB 9.0 (L) 04/06/2020   HCT 27.7 (L) 04/06/2020   MCV 91.7 04/06/2020   PLT 413 (H) 04/06/2020   NEUTROABS 2.8 04/06/2020    ASSESSMENT & PLAN:  Malignant neoplasm of lower-outer quadrant of left breast of female,  estrogen receptor positive ([place]) 01/18/2020:Palpable left breast lump with calcifications at 5 o'clock position measuring 3.3 cm. Biopsy revealed grade 3 IDC ER 95%, PR 0%, HER-2 3+ positive, Ki-67 50%, axilla negative, T2N0 stage IIa clinical stage Bilateral implants removed 2005  Treatment plan: 1.Neo- adjuvant chemotherapy with Taxol Herceptin 2.Breast conserving surgery with sentinel lymph node biopsy 3.Adjuvant radiation therapy followed by 4.Adjuvant antiestrogen therapywith anastrozole 1 mg daily x5 years  Breast MRI: 2.3 cm mass, indeterminate 8 mm mass (Biopsy: ADH), no LN --------------------------------------------------------------------------------------------------------------------------------------------------------------- Current Treatment: Cycle7Abraxane-Herceptin(Taxol discontinued because of rashsubstituted with Abraxane)  Chemo toxicities: Chemo induced anemia:Today's hemoglobin is 10.8 Fatigue: Monitoring closely Today's ANC is 1.9  Fixed drug eruption: probably due to Taxol.Taxol discontinued once starting Abraxane . Rash is better.  Left breast mass:Left breast ultrasound 03/10/2020: 3.1 x 1.4 x 2.5 cm mass. Previously it was 3.2 x 1.9 x 2.6 cm. Complex fluid collection extends 5 cm which is postbiopsy fluid collection/hematoma.  Hematuria: With clots, being treated with antibiotics with ciprofloxacin final twice daily. We will get an x-ray of her abdomen to evaluate the cause of this pain. I discussed with her that we might have to do a CT renal protocol to evaluate for kidney stones.  Return to clinic in 1 week for follow-up with me.    No orders of the defined types were placed in this encounter.  The patient has a good understanding of the overall plan. she agrees with it. she will call with any problems that may develop before the next visit here.  Total time spent: 30 mins including face to face time and time spent for planning,  charting and coordination of care  Nicholas Lose, MD 04/06/2020  I, Cloyde Reams Dorshimer, am acting as scribe for Dr. Nicholas Lose.  I have reviewed the above documentation for accuracy and completeness, and I agree with the above.

## 2020-04-06 ENCOUNTER — Ambulatory Visit (HOSPITAL_COMMUNITY)
Admission: RE | Admit: 2020-04-06 | Discharge: 2020-04-06 | Disposition: A | Discharge status: Home / Self Care | Payer: Medicare HMO | Source: Ambulatory Visit | Attending: Hematology and Oncology | Admitting: Hematology and Oncology

## 2020-04-06 ENCOUNTER — Inpatient Hospital Stay: Payer: Medicare HMO | Admitting: Hematology and Oncology

## 2020-04-06 ENCOUNTER — Inpatient Hospital Stay: Payer: Medicare HMO | Attending: Hematology and Oncology

## 2020-04-06 ENCOUNTER — Other Ambulatory Visit: Payer: Self-pay

## 2020-04-06 ENCOUNTER — Inpatient Hospital Stay: Payer: Medicare HMO

## 2020-04-06 VITALS — BP 152/60 | HR 98 | Temp 98.3°F | Resp 18 | Ht 61.0 in | Wt 105.3 lb

## 2020-04-06 DIAGNOSIS — Z95828 Presence of other vascular implants and grafts: Secondary | ICD-10-CM

## 2020-04-06 DIAGNOSIS — R1010 Upper abdominal pain, unspecified: Secondary | ICD-10-CM | POA: Insufficient documentation

## 2020-04-06 DIAGNOSIS — C50512 Malignant neoplasm of lower-outer quadrant of left female breast: Secondary | ICD-10-CM

## 2020-04-06 DIAGNOSIS — Z170 Estrogen receptor positive status [ER+]: Secondary | ICD-10-CM | POA: Insufficient documentation

## 2020-04-06 DIAGNOSIS — Z79899 Other long term (current) drug therapy: Secondary | ICD-10-CM | POA: Insufficient documentation

## 2020-04-06 DIAGNOSIS — Z5111 Encounter for antineoplastic chemotherapy: Secondary | ICD-10-CM | POA: Insufficient documentation

## 2020-04-06 DIAGNOSIS — M545 Low back pain: Secondary | ICD-10-CM | POA: Diagnosis not present

## 2020-04-06 DIAGNOSIS — R109 Unspecified abdominal pain: Secondary | ICD-10-CM | POA: Diagnosis not present

## 2020-04-06 LAB — CBC WITH DIFFERENTIAL (CANCER CENTER ONLY)
Abs Immature Granulocytes: 0.05 10*3/uL (ref 0.00–0.07)
Basophils Absolute: 0 10*3/uL (ref 0.0–0.1)
Basophils Relative: 1 %
Eosinophils Absolute: 0.5 10*3/uL (ref 0.0–0.5)
Eosinophils Relative: 11 %
HCT: 27.7 % — ABNORMAL LOW (ref 36.0–46.0)
Hemoglobin: 9 g/dL — ABNORMAL LOW (ref 12.0–15.0)
Immature Granulocytes: 1 %
Lymphocytes Relative: 19 %
Lymphs Abs: 0.9 10*3/uL (ref 0.7–4.0)
MCH: 29.8 pg (ref 26.0–34.0)
MCHC: 32.5 g/dL (ref 30.0–36.0)
MCV: 91.7 fL (ref 80.0–100.0)
Monocytes Absolute: 0.6 10*3/uL (ref 0.1–1.0)
Monocytes Relative: 12 %
Neutro Abs: 2.8 10*3/uL (ref 1.7–7.7)
Neutrophils Relative %: 56 %
Platelet Count: 413 10*3/uL — ABNORMAL HIGH (ref 150–400)
RBC: 3.02 MIL/uL — ABNORMAL LOW (ref 3.87–5.11)
RDW: 14.7 % (ref 11.5–15.5)
WBC Count: 4.9 10*3/uL (ref 4.0–10.5)
nRBC: 0 % (ref 0.0–0.2)

## 2020-04-06 LAB — CMP (CANCER CENTER ONLY)
ALT: <6 U/L (ref 0–44)
AST: 16 U/L (ref 15–41)
Albumin: 3.6 g/dL (ref 3.5–5.0)
Alkaline Phosphatase: 69 U/L (ref 38–126)
Anion gap: 9 (ref 5–15)
BUN: 19 mg/dL (ref 8–23)
CO2: 26 mmol/L (ref 22–32)
Calcium: 9.5 mg/dL (ref 8.9–10.3)
Chloride: 103 mmol/L (ref 98–111)
Creatinine: 0.96 mg/dL (ref 0.44–1.00)
GFR, Est AFR Am: >60 mL/min (ref >60)
GFR, Estimated: 57 mL/min — ABNORMAL LOW (ref >60)
Glucose, Bld: 115 mg/dL — ABNORMAL HIGH (ref 70–99)
Potassium: 3.4 mmol/L — ABNORMAL LOW (ref 3.5–5.1)
Sodium: 138 mmol/L (ref 135–145)
Total Bilirubin: 0.4 mg/dL (ref 0.3–1.2)
Total Protein: 6.7 g/dL (ref 6.5–8.1)

## 2020-04-06 IMAGING — DX DG ABDOMEN 2V
2 series · 2 of 2 positions shown · non-contrast
Comparison: CT abdomen and pelvis [DATE].

CLINICAL DATA: Right low back and abdominal pain since [DATE].

EXAM:
ABDOMEN - 2 VIEW

[abdomen erect]
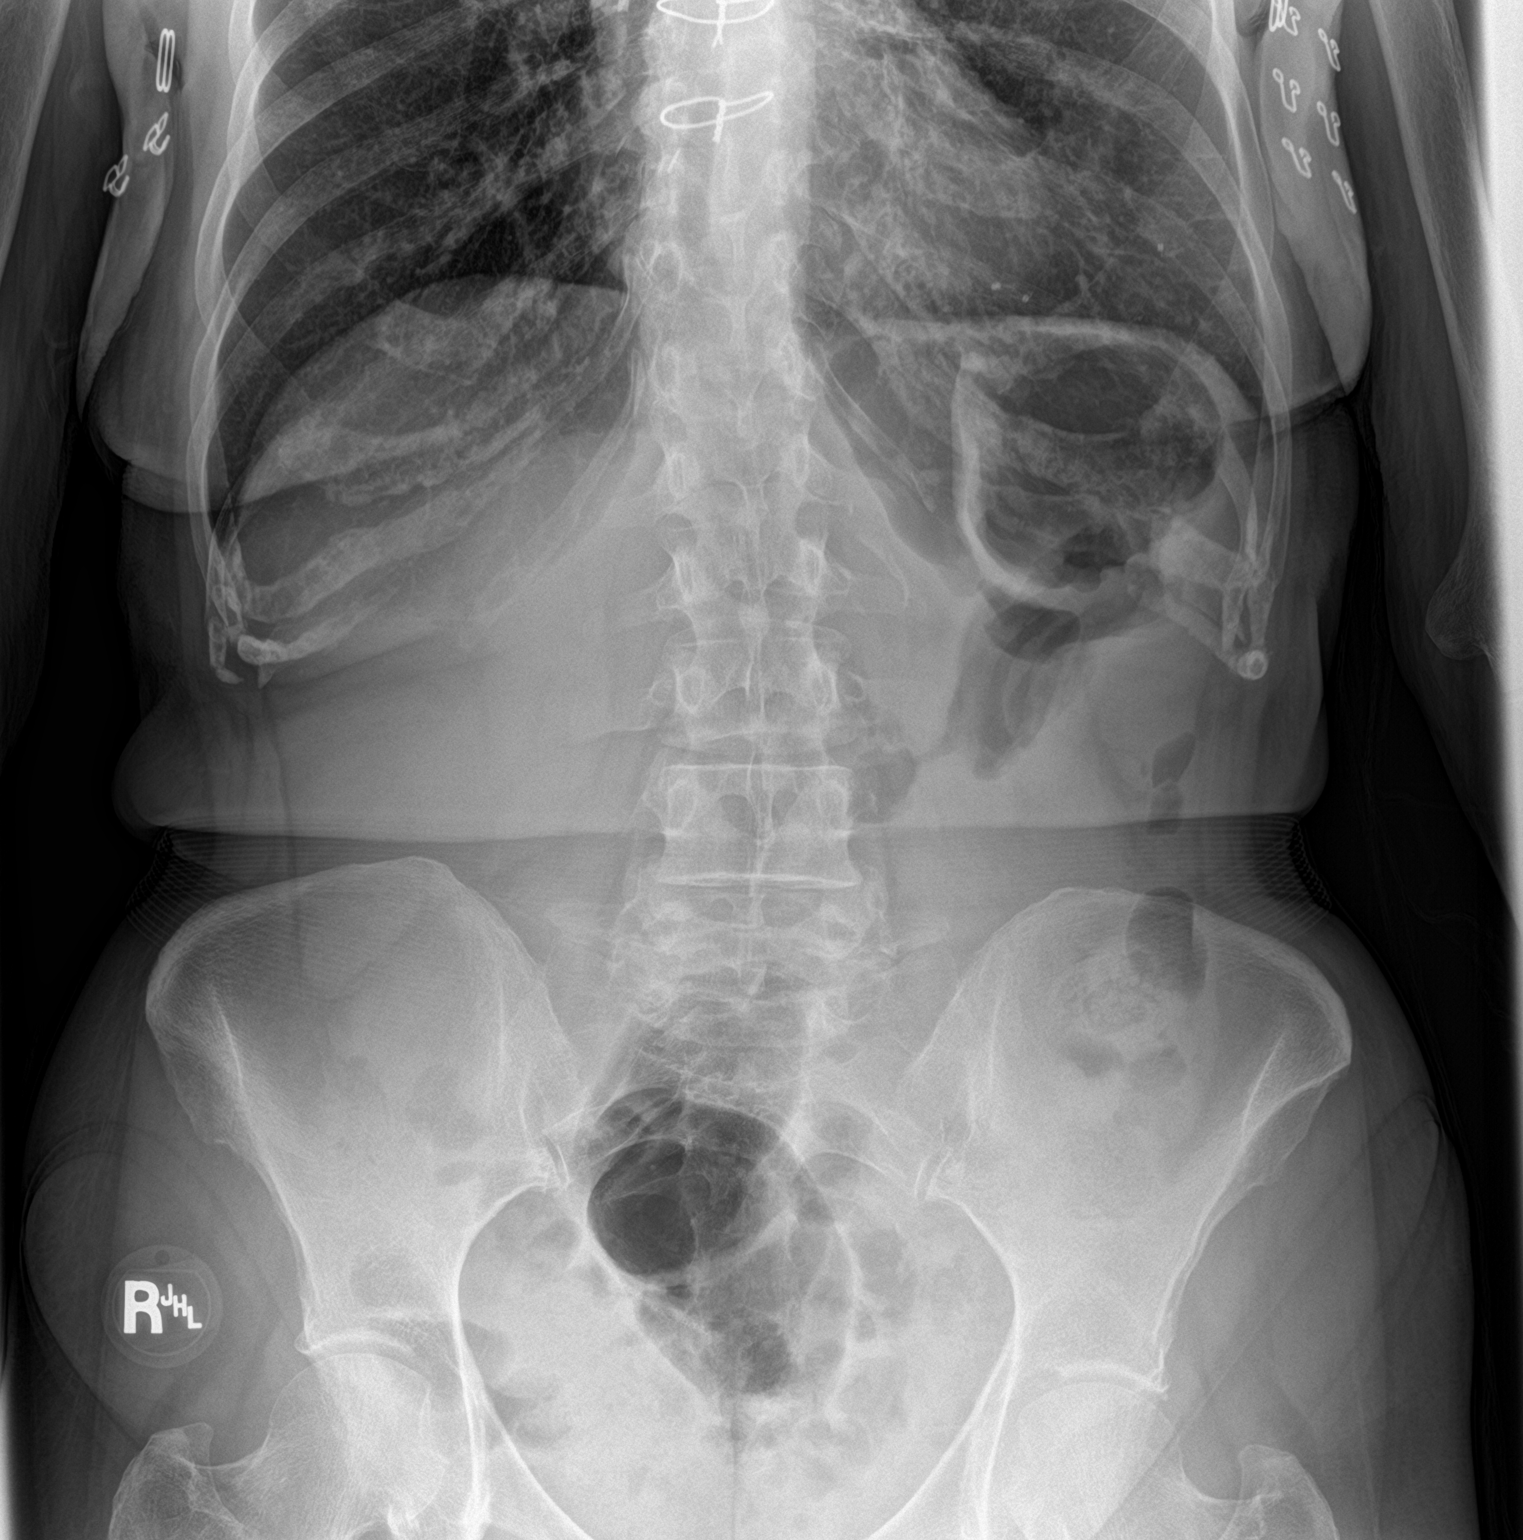

[abdomen supine]
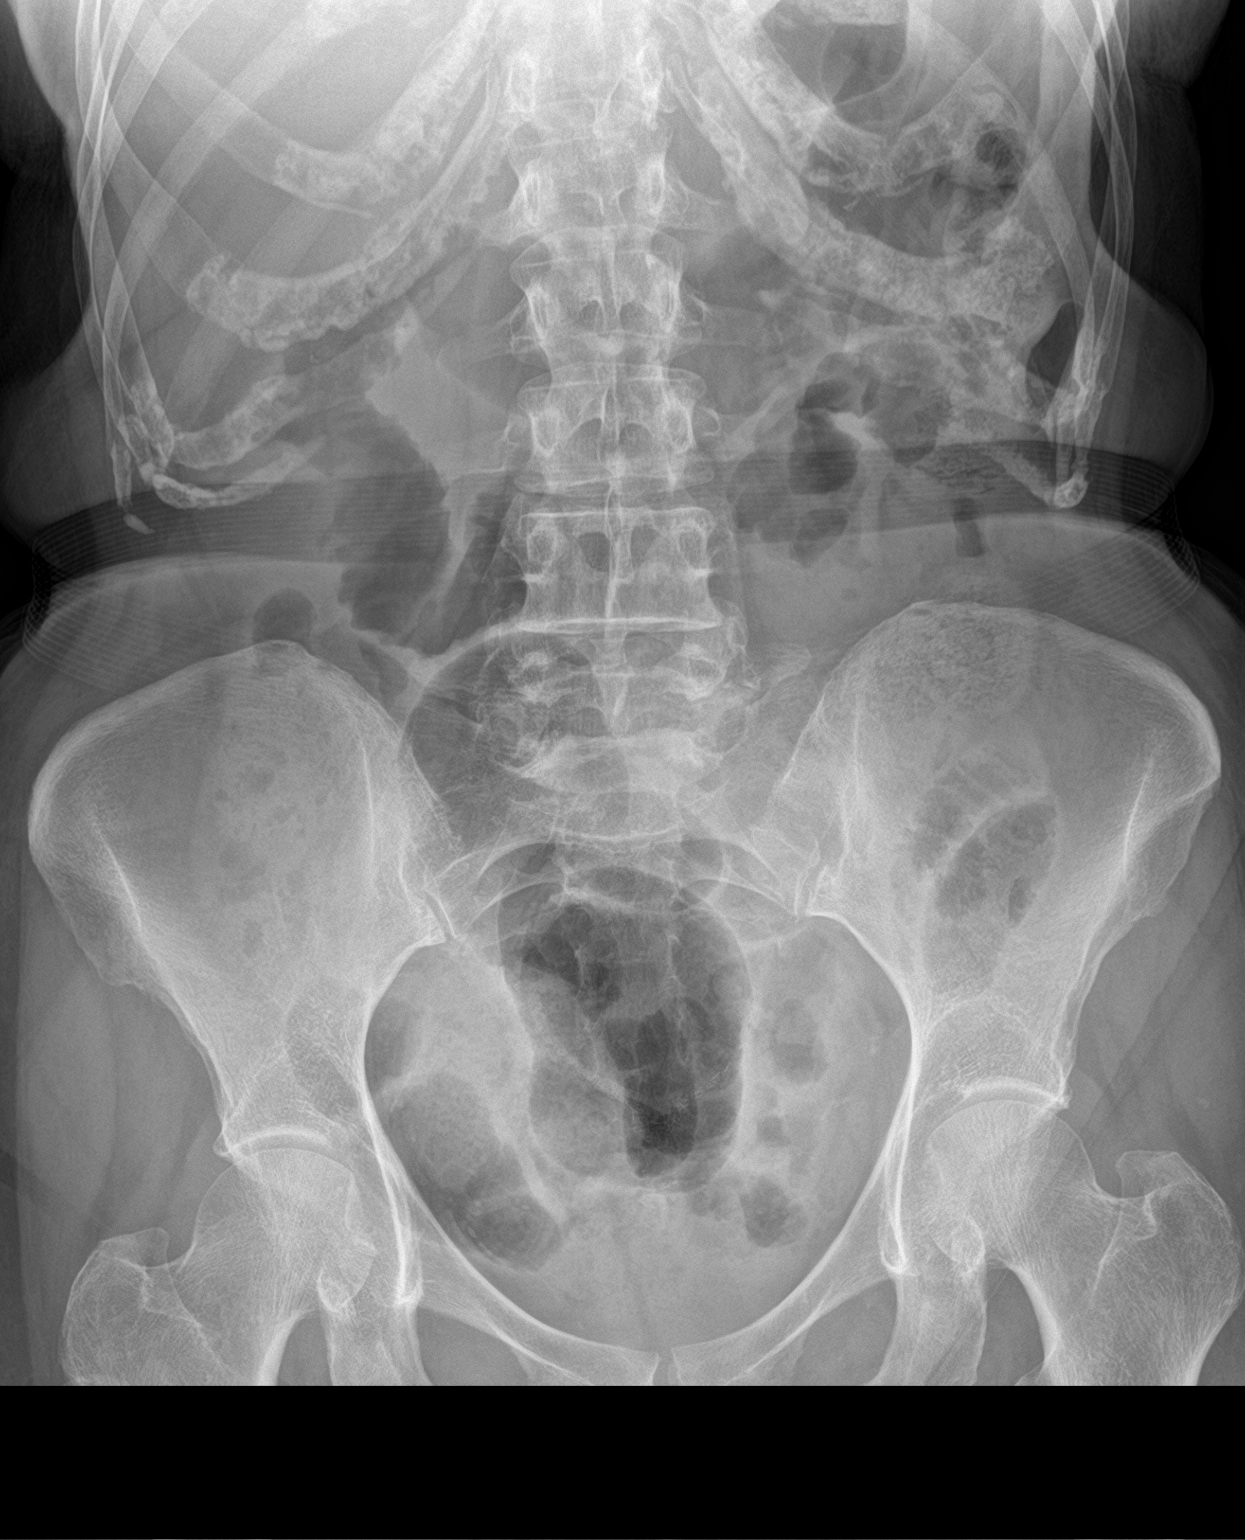

[2 of 2 positions shown; findings below may reference images not displayed]

FINDINGS: The bowel gas pattern is normal. There is no evidence of free air.
No radio-opaque calculi or other significant radiographic
abnormality is seen.
IMPRESSION: Negative exam.

## 2020-04-06 MED ORDER — SODIUM CHLORIDE 0.9% FLUSH
10.0000 mL | Freq: Once | INTRAVENOUS | Status: AC
  Administered 2020-04-06: 10 mL
  Filled 2020-04-06: qty 10

## 2020-04-06 MED ORDER — PACLITAXEL PROTEIN-BOUND CHEMO INJECTION 100 MG
80.0000 mg/m2 | Freq: Once | INTRAVENOUS | Status: AC
  Administered 2020-04-06: 125 mg via INTRAVENOUS
  Filled 2020-04-06: qty 25

## 2020-04-06 MED ORDER — HEPARIN SOD (PORK) LOCK FLUSH 100 UNIT/ML IV SOLN
500.0000 [IU] | Freq: Once | INTRAVENOUS | Status: AC | PRN
  Administered 2020-04-06: 500 [IU]
  Filled 2020-04-06: qty 5

## 2020-04-06 MED ORDER — ACETAMINOPHEN 325 MG PO TABS
ORAL_TABLET | ORAL | Status: AC
  Filled 2020-04-06: qty 2

## 2020-04-06 MED ORDER — SODIUM CHLORIDE 0.9 % IV SOLN
Freq: Once | INTRAVENOUS | Status: AC
  Filled 2020-04-06: qty 250

## 2020-04-06 MED ORDER — DIPHENHYDRAMINE HCL 50 MG/ML IJ SOLN
INTRAMUSCULAR | Status: AC
  Filled 2020-04-06: qty 1

## 2020-04-06 MED ORDER — PROCHLORPERAZINE MALEATE 10 MG PO TABS
ORAL_TABLET | ORAL | Status: AC
  Filled 2020-04-06: qty 1

## 2020-04-06 MED ORDER — SODIUM CHLORIDE 0.9% FLUSH
10.0000 mL | INTRAVENOUS | Status: DC | PRN
  Administered 2020-04-06: 10 mL
  Filled 2020-04-06: qty 10

## 2020-04-06 MED ORDER — TRASTUZUMAB-ANNS CHEMO 150 MG IV SOLR
2.0000 mg/kg | Freq: Once | INTRAVENOUS | Status: AC
  Administered 2020-04-06: 105 mg via INTRAVENOUS
  Filled 2020-04-06: qty 5

## 2020-04-06 MED ORDER — ACETAMINOPHEN 325 MG PO TABS
650.0000 mg | ORAL_TABLET | Freq: Once | ORAL | Status: AC
  Administered 2020-04-06: 650 mg via ORAL

## 2020-04-06 MED ORDER — DIPHENHYDRAMINE HCL 50 MG/ML IJ SOLN
25.0000 mg | Freq: Once | INTRAMUSCULAR | Status: AC
  Administered 2020-04-06: 25 mg via INTRAVENOUS

## 2020-04-06 MED ORDER — PROCHLORPERAZINE MALEATE 10 MG PO TABS
10.0000 mg | ORAL_TABLET | Freq: Once | ORAL | Status: AC
  Administered 2020-04-06: 10 mg via ORAL

## 2020-04-06 NOTE — Patient Instructions (Signed)
[facility] Discharge Instructions for Patients Receiving Chemotherapy  Today you received the following chemotherapy agents: Trastuzumab, Abraxane  To help prevent nausea and vomiting after your treatment, we encourage you to take your nausea medication as directed.   If you develop nausea and vomiting that is not controlled by your nausea medication, call the clinic.   BELOW ARE SYMPTOMS THAT SHOULD BE REPORTED IMMEDIATELY:  *FEVER GREATER THAN 100.5 F  *CHILLS WITH OR WITHOUT FEVER  NAUSEA AND VOMITING THAT IS NOT CONTROLLED WITH YOUR NAUSEA MEDICATION  *UNUSUAL SHORTNESS OF BREATH  *UNUSUAL BRUISING OR BLEEDING  TENDERNESS IN MOUTH AND THROAT WITH OR WITHOUT PRESENCE OF ULCERS  *URINARY PROBLEMS  *BOWEL PROBLEMS  UNUSUAL RASH Items with * indicate a potential emergency and should be followed up as soon as possible.  Feel free to call the clinic should you have any questions or concerns. The clinic phone number is [phone].  Please show the CHEMO ALERT CARD at check-in to the Emergency Department and triage nurse.   

## 2020-04-06 NOTE — Assessment & Plan Note (Signed)
01/18/2020:Palpable left breast lump with calcifications at 5 o'clock position measuring 3.3 cm. Biopsy revealed grade 3 IDC ER 95%, PR 0%, HER-2 3+ positive, Ki-67 50%, axilla negative, T2N0 stage IIa clinical stage Bilateral implants removed 2005  Treatment plan: 1.Neo- adjuvant chemotherapy with Taxol Herceptin 2.Breast conserving surgery with sentinel lymph node biopsy 3.Adjuvant radiation therapy followed by 4.Adjuvant antiestrogen therapywith anastrozole 1 mg daily x5 years  Breast MRI: 2.3 cm mass, indeterminate 8 mm mass (Biopsy: ADH), no LN --------------------------------------------------------------------------------------------------------------------------------------------------------------- Current Treatment: Cycle7Abraxane-Herceptin(Taxol discontinued because of rashsubstituted with Abraxane)  Chemo toxicities: Chemo induced anemia:Today's hemoglobin is 10.8 Fatigue: Monitoring closely Today's ANC is 1.9 Fixed drug eruption: probably due to Taxol.Taxol discontinued once starting Abraxane . Rash is better.  Left breast mass:Left breast ultrasound 03/10/2020: 3.1 x 1.4 x 2.5 cm mass. Previously it was 3.2 x 1.9 x 2.6 cm. Complex fluid collection extends 5 cm which is postbiopsy fluid collection/hematoma.  Return to clinic weekly for every other week follow-up with me.

## 2020-04-06 NOTE — Patient Instructions (Signed)

## 2020-04-07 ENCOUNTER — Other Ambulatory Visit: Payer: Self-pay | Admitting: Hematology and Oncology

## 2020-04-07 ENCOUNTER — Telehealth: Payer: Self-pay | Admitting: Hematology and Oncology

## 2020-04-07 DIAGNOSIS — R319 Hematuria, unspecified: Secondary | ICD-10-CM

## 2020-04-07 NOTE — Telephone Encounter (Signed)
I informed the patient that the x-ray of the abdomen did not reveal any stones or abnormalities.  She continues to have hematuria and therefore I will order a CT renal protocol and will refer her to see urology.

## 2020-04-07 NOTE — Telephone Encounter (Signed)
No 8/5 los. No changes made to pt's schedule.  °

## 2020-04-10 ENCOUNTER — Telehealth: Payer: Self-pay | Admitting: Hematology and Oncology

## 2020-04-10 NOTE — Telephone Encounter (Signed)
Release: [id number] New Pt Referral-Faxed referral and medical records to [facility] @ [phone]

## 2020-04-11 NOTE — Progress Notes (Signed)
Patient Care Team: [name], MD as PCP - General (Family Medicine) [name], MD as PCP - Cardiology (Cardiology) [name], MD as Surgeon (General Surgery) [name], RN as Oncology Nurse Navigator Mauro Kaufmann, RN as Oncology Nurse Navigator  DIAGNOSIS:    ICD-10-CM   1. Malignant neoplasm of lower-outer quadrant of left breast of female, estrogen receptor positive ([place])  C50.512    Z17.0     SUMMARY OF ONCOLOGIC HISTORY: Oncology History  Malignant neoplasm of lower-outer quadrant of left breast of female, estrogen receptor positive ([place])  01/18/2020 Initial Diagnosis   Palpable left breast lump with calcifications at 5 o'clock position measuring 3.3 cm.  Biopsy revealed grade 3 IDC ER 95%, PR 0%, HER-2 3+ positive, Ki-67 50%, axilla negative, T2N0   01/26/2020 Cancer Staging   Staging form: Breast, AJCC 8th Edition - Clinical stage from 01/26/2020: Stage IIA (cT2, cN0, cM0, G3, ER+, PR-, HER2+) - Signed by Nicholas Lose, MD on 01/26/2020   02/16/2020 -  Chemotherapy   The patient had PACLitaxel-protein bound (ABRAXANE) chemo infusion 125 mg, 80 mg/m2 = 125 mg (100 % of original dose 80 mg/m2), Intravenous,  Once, 1 of 2 cycles Dose modification: 80 mg/m2 (original dose 80 mg/m2, Cycle 2) Administration: 125 mg (03/22/2020), 125 mg (03/29/2020), 125 mg (04/06/2020) PACLitaxel (TAXOL) 120 mg in sodium chloride 0.9 % 250 mL chemo infusion (</= 63m/m2), 80 mg/m2 = 120 mg, Intravenous,  Once, 1 of 1 cycle Administration: 120 mg (02/16/2020), 120 mg (02/23/2020), 120 mg (03/01/2020), 120 mg (03/08/2020) trastuzumab-anns (KANJINTI) 189 mg in sodium chloride 0.9 % 250 mL chemo infusion, 4 mg/kg = 189 mg, Intravenous,  Once, 2 of 3 cycles Administration: 189 mg (02/16/2020), 105 mg (02/23/2020), 105 mg (03/01/2020), 105 mg (03/08/2020), 105 mg (03/15/2020), 105 mg (03/22/2020), 105 mg (03/29/2020), 105 mg (04/06/2020)  for chemotherapy treatment.      CHIEF COMPLIANT:  Cycle8 AbraxaneHerceptin  INTERVAL HISTORY: Rachel Valentine a 79y.o. with above-mentioned history of left breastcancer currently on neoadjuvant chemotherapy withAbraxane andHerceptin.She presents to the clinic todayfora toxicity checkandcycle 8.   She continues to have a profound hematuria almost on a daily basis along with flank discomfort and pain.  She also feels weak and dizzy and short of breath to minimal exertion.  Her hemoglobin today is only 7.4.  ALLERGIES:  is allergic to paclitaxel, fosamax [alendronate], prednisone, clindamycin/lincomycin, and penicillins.  MEDICATIONS:  Current Outpatient Medications  Medication Sig Dispense Refill  . aspirin EC 81 MG tablet Take 81 mg by mouth daily.    . Biotin 10000 MCG TABS Take 10,000 mg by mouth daily.  (Patient not taking: Reported on 03/29/2020)    . calcium citrate (CALCITRATE - DOSED IN MG ELEMENTAL CALCIUM) 950 (200 Ca) MG tablet Take 200 mg of elemental calcium by mouth 2 (two) times daily.     . Calcium-Phosphorus-Vitamin D (CITRACAL +D3 PO) Take 1 tablet by mouth in the morning and at bedtime.     . ciprofloxacin (CIPRO) 250 MG tablet Take 1 tablet (250 mg total) by mouth 2 (two) times daily. 14 tablet 0  . clopidogrel (PLAVIX) 75 MG tablet Take 1 tablet (75 mg total) by mouth daily. 90 tablet 3  . diphenhydrAMINE (BENADRYL) 25 mg capsule Take 25 mg by mouth at bedtime as needed for allergies.     .Marland Kitchenlidocaine-prilocaine (EMLA) cream Apply to affected area once 30 g 3  . LORazepam (ATIVAN) 0.5 MG tablet Take 1  tablet (0.5 mg total) by mouth every 6 (six) hours as needed (Nausea or vomiting). 30 tablet 0  . metoprolol succinate (TOPROL-XL) 25 MG 24 hr tablet Take 25 mg by mouth daily.    . nitroGLYCERIN (NITROSTAT) 0.4 MG SL tablet Place 1 tablet (0.4 mg total) under the tongue every 5 (five) minutes as needed for chest pain. 30 tablet 0  . ondansetron (ZOFRAN) 8 MG tablet Take 1 tablet (8 mg total) by mouth 2  (two) times daily as needed (Nausea or vomiting). 30 tablet 1  . pantoprazole (PROTONIX) 40 MG tablet TAKE 1 TABLET BY MOUTH EVERY DAY (Patient taking differently: Take 40 mg by mouth daily. ) 90 tablet 2  . prochlorperazine (COMPAZINE) 10 MG tablet Take 1 tablet (10 mg total) by mouth every 6 (six) hours as needed (Nausea or vomiting). 30 tablet 1  . Propylene Glycol (SYSTANE BALANCE) 0.6 % SOLN Place 2 drops into both eyes as needed (Dry eye).    . rosuvastatin (CRESTOR) 40 MG tablet Take 40 mg by mouth daily.      No current facility-administered medications for this visit.    PHYSICAL EXAMINATION: ECOG PERFORMANCE STATUS: 1 - Symptomatic but completely ambulatory  Vitals:   04/12/20 1301  BP: (!) 144/47  Pulse: 82  Resp: 16  Temp: (!) 97.4 F (36.3 C)  SpO2: 100%   Filed Weights   04/12/20 1301  Weight: 107 lb 9.6 oz (48.8 kg)    LABORATORY DATA:  I have reviewed the data as listed CMP Latest Ref Rng & Units 04/06/2020 03/29/2020 03/22/2020  Glucose 70 - 99 mg/dL 115(H) 93 100(H)  BUN 8 - 23 mg/dL 19 16 20   Creatinine 0.44 - 1.00 mg/dL 0.96 0.71 0.78  Sodium 135 - 145 mmol/L 138 140 142  Potassium 3.5 - 5.1 mmol/L 3.4(L) 4.4 4.1  Chloride 98 - 111 mmol/L 103 105 108  CO2 22 - 32 mmol/L 26 28 25   Calcium 8.9 - 10.3 mg/dL 9.5 10.1 9.6  Total Protein 6.5 - 8.1 g/dL 6.7 6.8 6.8  Total Bilirubin 0.3 - 1.2 mg/dL 0.4 0.3 0.4  Alkaline Phos 38 - 126 U/L 69 74 80  AST 15 - 41 U/L 16 18 17   ALT 0 - 44 U/L <6 8 9     Lab Results  Component Value Date   WBC 5.2 04/12/2020   HGB 7.4 (L) 04/12/2020   HCT 23.7 (L) 04/12/2020   MCV 92.9 04/12/2020   PLT 451 (H) 04/12/2020   NEUTROABS 3.7 04/12/2020    ASSESSMENT & PLAN:  Malignant neoplasm of lower-outer quadrant of left breast of female, estrogen receptor positive ([place]) 01/18/2020:Palpable left breast lump with calcifications at 5 o'clock position measuring 3.3 cm. Biopsy revealed grade 3 IDC ER 95%, PR 0%, HER-2 3+ positive,  Ki-67 50%, axilla negative, T2N0 stage IIa clinical stage Bilateral implants removed 2005  Treatment plan: 1.Neo- adjuvant chemotherapy with Taxol Herceptin 2.Breast conserving surgery with sentinel lymph node biopsy 3.Adjuvant radiation therapy followed by 4.Adjuvant antiestrogen therapywith anastrozole 1 mg daily x5 years  Breast MRI: 2.3 cm mass, indeterminate 8 mm mass (Biopsy: ADH), no LN --------------------------------------------------------------------------------------------------------------------------------------------------------------- Current Treatment: Cycle8Abraxane-Herceptin(Taxol discontinued because of rashsubstituted with Abraxane)  Chemo toxicities: Chemo induced anemia:Today's hemoglobin is 7.4: We will try to transfuse 2 units of PRBC at the next available opportunity. Fatigue: Secondary to severe anemia Today's ANC is 3.7 Fixed drug eruption: Due to Taxol.Taxol discontinued once starting Abraxane. Rash is better.  Left breast mass:Left breast ultrasound 03/10/2020:  3.1 x 1.4 x 2.5 cm mass. Previously it was 3.2 x 1.9 x 2.6 cm. Complex fluid collection extends 5 cm which is postbiopsy fluid collection/hematoma.  Hematuria: With clots, she completed treatment with ciprofloxacin. CT renal protocol has been ordered. Also refered her to urology.  She has an appointment with urology tomorrow at 9 AM. We try to obtain the CT scan urgently but it looks like the next available appointment will be on 04/27/2020.  Return to clinic weekly for chemo and every other week for follow-up with me    No orders of the defined types were placed in this encounter.  The patient has a good understanding of the overall plan. she agrees with it. she will call with any problems that may develop before the next visit here.  Total time spent: 30 mins including face to face time and time spent for planning, charting and coordination of care   I, Molly Dorshimer,  am acting as scribe for Dr. Nicholas Lose.  I have reviewed the above documentation for accuracy and completeness, and I agree with the above.

## 2020-04-12 ENCOUNTER — Other Ambulatory Visit: Payer: Self-pay | Admitting: *Deleted

## 2020-04-12 ENCOUNTER — Encounter: Payer: Self-pay | Admitting: *Deleted

## 2020-04-12 ENCOUNTER — Inpatient Hospital Stay: Payer: Medicare HMO

## 2020-04-12 ENCOUNTER — Inpatient Hospital Stay: Payer: Medicare HMO | Admitting: Hematology and Oncology

## 2020-04-12 ENCOUNTER — Other Ambulatory Visit: Payer: Self-pay

## 2020-04-12 DIAGNOSIS — C50512 Malignant neoplasm of lower-outer quadrant of left female breast: Secondary | ICD-10-CM

## 2020-04-12 DIAGNOSIS — Z170 Estrogen receptor positive status [ER+]: Secondary | ICD-10-CM | POA: Diagnosis not present

## 2020-04-12 DIAGNOSIS — Z5111 Encounter for antineoplastic chemotherapy: Secondary | ICD-10-CM | POA: Diagnosis not present

## 2020-04-12 DIAGNOSIS — Z95828 Presence of other vascular implants and grafts: Secondary | ICD-10-CM

## 2020-04-12 LAB — CBC WITH DIFFERENTIAL (CANCER CENTER ONLY)
Abs Immature Granulocytes: 0.07 10*3/uL (ref 0.00–0.07)
Basophils Absolute: 0.1 10*3/uL (ref 0.0–0.1)
Basophils Relative: 1 %
Eosinophils Absolute: 0.2 10*3/uL (ref 0.0–0.5)
Eosinophils Relative: 4 %
HCT: 23.7 % — ABNORMAL LOW (ref 36.0–46.0)
Hemoglobin: 7.4 g/dL — ABNORMAL LOW (ref 12.0–15.0)
Immature Granulocytes: 1 %
Lymphocytes Relative: 16 %
Lymphs Abs: 0.8 10*3/uL (ref 0.7–4.0)
MCH: 29 pg (ref 26.0–34.0)
MCHC: 31.2 g/dL (ref 30.0–36.0)
MCV: 92.9 fL (ref 80.0–100.0)
Monocytes Absolute: 0.4 10*3/uL (ref 0.1–1.0)
Monocytes Relative: 7 %
Neutro Abs: 3.7 10*3/uL (ref 1.7–7.7)
Neutrophils Relative %: 71 %
Platelet Count: 451 10*3/uL — ABNORMAL HIGH (ref 150–400)
RBC: 2.55 MIL/uL — ABNORMAL LOW (ref 3.87–5.11)
RDW: 15.9 % — ABNORMAL HIGH (ref 11.5–15.5)
WBC Count: 5.2 10*3/uL (ref 4.0–10.5)
nRBC: 0 % (ref 0.0–0.2)

## 2020-04-12 LAB — CMP (CANCER CENTER ONLY)
ALT: <6 U/L (ref 0–44)
AST: 16 U/L (ref 15–41)
Albumin: 3.4 g/dL — ABNORMAL LOW (ref 3.5–5.0)
Alkaline Phosphatase: 67 U/L (ref 38–126)
Anion gap: 9 (ref 5–15)
BUN: 19 mg/dL (ref 8–23)
CO2: 25 mmol/L (ref 22–32)
Calcium: 9.6 mg/dL (ref 8.9–10.3)
Chloride: 105 mmol/L (ref 98–111)
Creatinine: 0.85 mg/dL (ref 0.44–1.00)
GFR, Est AFR Am: >60 mL/min
GFR, Estimated: >60 mL/min
Glucose, Bld: 112 mg/dL — ABNORMAL HIGH (ref 70–99)
Potassium: 3.7 mmol/L (ref 3.5–5.1)
Sodium: 139 mmol/L (ref 135–145)
Total Bilirubin: 0.3 mg/dL (ref 0.3–1.2)
Total Protein: 6.2 g/dL — ABNORMAL LOW (ref 6.5–8.1)

## 2020-04-12 LAB — PREPARE RBC (CROSSMATCH)

## 2020-04-12 MED ORDER — ACETAMINOPHEN 325 MG PO TABS
ORAL_TABLET | ORAL | Status: AC
  Filled 2020-04-12: qty 2

## 2020-04-12 MED ORDER — SODIUM CHLORIDE 0.9 % IV SOLN
Freq: Once | INTRAVENOUS | Status: AC
  Filled 2020-04-12: qty 250

## 2020-04-12 MED ORDER — SODIUM CHLORIDE 0.9% FLUSH
10.0000 mL | INTRAVENOUS | Status: DC | PRN
  Administered 2020-04-12: 10 mL
  Filled 2020-04-12: qty 10

## 2020-04-12 MED ORDER — HEPARIN SOD (PORK) LOCK FLUSH 100 UNIT/ML IV SOLN
500.0000 [IU] | Freq: Once | INTRAVENOUS | Status: AC
  Administered 2020-04-12: 500 [IU]
  Filled 2020-04-12: qty 5

## 2020-04-12 MED ORDER — SODIUM CHLORIDE 0.9% FLUSH
10.0000 mL | Freq: Once | INTRAVENOUS | Status: AC
  Administered 2020-04-12: 10 mL
  Filled 2020-04-12: qty 10

## 2020-04-12 MED ORDER — PACLITAXEL PROTEIN-BOUND CHEMO INJECTION 100 MG
80.0000 mg/m2 | Freq: Once | INTRAVENOUS | Status: AC
  Administered 2020-04-12: 125 mg via INTRAVENOUS
  Filled 2020-04-12: qty 25

## 2020-04-12 MED ORDER — ACETAMINOPHEN 325 MG PO TABS
650.0000 mg | ORAL_TABLET | Freq: Once | ORAL | Status: AC
  Administered 2020-04-12: 650 mg via ORAL

## 2020-04-12 MED ORDER — DIPHENHYDRAMINE HCL 50 MG/ML IJ SOLN
INTRAMUSCULAR | Status: AC
  Filled 2020-04-12: qty 1

## 2020-04-12 MED ORDER — TRASTUZUMAB-ANNS CHEMO 150 MG IV SOLR
2.0000 mg/kg | Freq: Once | INTRAVENOUS | Status: AC
  Administered 2020-04-12: 105 mg via INTRAVENOUS
  Filled 2020-04-12: qty 5

## 2020-04-12 MED ORDER — PROCHLORPERAZINE MALEATE 10 MG PO TABS
10.0000 mg | ORAL_TABLET | Freq: Once | ORAL | Status: AC
  Administered 2020-04-12: 10 mg via ORAL

## 2020-04-12 MED ORDER — PROCHLORPERAZINE MALEATE 10 MG PO TABS
ORAL_TABLET | ORAL | Status: AC
  Filled 2020-04-12: qty 1

## 2020-04-12 MED ORDER — HEPARIN SOD (PORK) LOCK FLUSH 100 UNIT/ML IV SOLN
500.0000 [IU] | Freq: Once | INTRAVENOUS | Status: AC | PRN
  Administered 2020-04-12: 500 [IU]
  Filled 2020-04-12: qty 5

## 2020-04-12 MED ORDER — DIPHENHYDRAMINE HCL 50 MG/ML IJ SOLN
25.0000 mg | Freq: Once | INTRAMUSCULAR | Status: AC
  Administered 2020-04-12: 25 mg via INTRAVENOUS

## 2020-04-12 NOTE — Patient Instructions (Signed)
[facility] Discharge Instructions for Patients Receiving Chemotherapy  Today you received the following chemotherapy agents: trastuzumab and albumin-bound paclitaxel.  To help prevent nausea and vomiting after your treatment, we encourage you to take your nausea medication as directed.   If you develop nausea and vomiting that is not controlled by your nausea medication, call the clinic.   BELOW ARE SYMPTOMS THAT SHOULD BE REPORTED IMMEDIATELY:  *FEVER GREATER THAN 100.5 F  *CHILLS WITH OR WITHOUT FEVER  NAUSEA AND VOMITING THAT IS NOT CONTROLLED WITH YOUR NAUSEA MEDICATION  *UNUSUAL SHORTNESS OF BREATH  *UNUSUAL BRUISING OR BLEEDING  TENDERNESS IN MOUTH AND THROAT WITH OR WITHOUT PRESENCE OF ULCERS  *URINARY PROBLEMS  *BOWEL PROBLEMS  UNUSUAL RASH Items with * indicate a potential emergency and should be followed up as soon as possible.  Feel free to call the clinic should you have any questions or concerns. The clinic phone number is [phone].  Please show the [place] at check-in to the Emergency Department and triage nurse.

## 2020-04-12 NOTE — Progress Notes (Signed)
Per Dr Lindi Adie, ok to treat with hgb 7.4. Patient will get 2 units on Friday 8/13.

## 2020-04-12 NOTE — Assessment & Plan Note (Signed)
01/18/2020:Palpable left breast lump with calcifications at 5 o'clock position measuring 3.3 cm. Biopsy revealed grade 3 IDC ER 95%, PR 0%, HER-2 3+ positive, Ki-67 50%, axilla negative, T2N0 stage IIa clinical stage Bilateral implants removed 2005  Treatment plan: 1.Neo- adjuvant chemotherapy with Taxol Herceptin 2.Breast conserving surgery with sentinel lymph node biopsy 3.Adjuvant radiation therapy followed by 4.Adjuvant antiestrogen therapywith anastrozole 1 mg daily x5 years  Breast MRI: 2.3 cm mass, indeterminate 8 mm mass (Biopsy: ADH), no LN --------------------------------------------------------------------------------------------------------------------------------------------------------------- Current Treatment: Cycle8Abraxane-Herceptin(Taxol discontinued because of rashsubstituted with Abraxane)  Chemo toxicities: Chemo induced anemia:Today's hemoglobin is 10.8 Fatigue: Monitoring closely Today's ANC is 1.9 Fixed drug eruption: Due to Taxol.Taxol discontinued once starting Abraxane. Rash is better.  Left breast mass:Left breast ultrasound 03/10/2020: 3.1 x 1.4 x 2.5 cm mass. Previously it was 3.2 x 1.9 x 2.6 cm. Complex fluid collection extends 5 cm which is postbiopsy fluid collection/hematoma.  Hematuria: With clots, being treated with antibiotics with ciprofloxacin final twice daily. CT renal protocol has been ordered. Also refer her to urology.  Return to clinic  weekly for chemo and every other week for follow-up with me

## 2020-04-12 NOTE — Progress Notes (Signed)
Pt hgb 7.4.  Per MD pt to receive 2 units of PRBC's.  Orders placed and high priority message sent to scheduling.

## 2020-04-13 ENCOUNTER — Telehealth: Payer: Self-pay | Admitting: Hematology and Oncology

## 2020-04-13 DIAGNOSIS — R1111 Vomiting without nausea: Secondary | ICD-10-CM | POA: Diagnosis not present

## 2020-04-13 DIAGNOSIS — D4101 Neoplasm of uncertain behavior of right kidney: Secondary | ICD-10-CM | POA: Diagnosis not present

## 2020-04-13 DIAGNOSIS — R310 Gross hematuria: Secondary | ICD-10-CM | POA: Diagnosis not present

## 2020-04-13 DIAGNOSIS — I700 Atherosclerosis of aorta: Secondary | ICD-10-CM | POA: Diagnosis not present

## 2020-04-13 DIAGNOSIS — J986 Disorders of diaphragm: Secondary | ICD-10-CM | POA: Diagnosis not present

## 2020-04-13 DIAGNOSIS — R1084 Generalized abdominal pain: Secondary | ICD-10-CM | POA: Diagnosis not present

## 2020-04-13 DIAGNOSIS — N1330 Unspecified hydronephrosis: Secondary | ICD-10-CM | POA: Diagnosis not present

## 2020-04-13 NOTE — Telephone Encounter (Signed)
No 8/11 los, no changes made to pt schedule

## 2020-04-14 ENCOUNTER — Inpatient Hospital Stay: Payer: Medicare HMO

## 2020-04-14 ENCOUNTER — Other Ambulatory Visit: Payer: Self-pay

## 2020-04-14 DIAGNOSIS — Z170 Estrogen receptor positive status [ER+]: Secondary | ICD-10-CM

## 2020-04-14 DIAGNOSIS — Z5111 Encounter for antineoplastic chemotherapy: Secondary | ICD-10-CM | POA: Diagnosis not present

## 2020-04-14 DIAGNOSIS — C50512 Malignant neoplasm of lower-outer quadrant of left female breast: Secondary | ICD-10-CM

## 2020-04-14 MED ORDER — ACETAMINOPHEN 325 MG PO TABS
ORAL_TABLET | ORAL | Status: AC
  Filled 2020-04-14: qty 2

## 2020-04-14 MED ORDER — HEPARIN SOD (PORK) LOCK FLUSH 100 UNIT/ML IV SOLN
250.0000 [IU] | INTRAVENOUS | Status: AC | PRN
  Filled 2020-04-14: qty 5

## 2020-04-14 MED ORDER — DIPHENHYDRAMINE HCL 25 MG PO CAPS
25.0000 mg | ORAL_CAPSULE | Freq: Once | ORAL | Status: AC
  Administered 2020-04-14: 25 mg via ORAL

## 2020-04-14 MED ORDER — ACETAMINOPHEN 325 MG PO TABS
650.0000 mg | ORAL_TABLET | Freq: Once | ORAL | Status: AC
  Administered 2020-04-14: 650 mg via ORAL

## 2020-04-14 MED ORDER — DIPHENHYDRAMINE HCL 25 MG PO CAPS
ORAL_CAPSULE | ORAL | Status: AC
  Filled 2020-04-14: qty 1

## 2020-04-14 MED ORDER — SODIUM CHLORIDE 0.9% FLUSH
10.0000 mL | INTRAVENOUS | Status: AC | PRN
  Administered 2020-04-14: 10 mL
  Filled 2020-04-14: qty 10

## 2020-04-14 MED ORDER — SODIUM CHLORIDE 0.9% IV SOLUTION
250.0000 mL | Freq: Once | INTRAVENOUS | Status: AC
  Administered 2020-04-14: 250 mL via INTRAVENOUS
  Filled 2020-04-14: qty 250

## 2020-04-14 MED ORDER — HEPARIN SOD (PORK) LOCK FLUSH 100 UNIT/ML IV SOLN
500.0000 [IU] | Freq: Every day | INTRAVENOUS | Status: AC | PRN
  Administered 2020-04-14: 500 [IU]
  Filled 2020-04-14: qty 5

## 2020-04-14 NOTE — Patient Instructions (Signed)

## 2020-04-15 LAB — TYPE AND SCREEN
ABO/RH(D): O POS
Antibody Screen: NEGATIVE
Unit division: 0
Unit division: 0

## 2020-04-15 LAB — BPAM RBC
Blood Product Expiration Date: 202109092359
Blood Product Expiration Date: 202109092359
ISSUE DATE / TIME: 202108131232
ISSUE DATE / TIME: 202108131232
Unit Type and Rh: 5100
Unit Type and Rh: 5100

## 2020-04-17 ENCOUNTER — Encounter: Payer: Self-pay | Admitting: Hematology and Oncology

## 2020-04-17 DIAGNOSIS — Z90710 Acquired absence of both cervix and uterus: Secondary | ICD-10-CM | POA: Diagnosis not present

## 2020-04-17 DIAGNOSIS — N281 Cyst of kidney, acquired: Secondary | ICD-10-CM | POA: Diagnosis not present

## 2020-04-17 DIAGNOSIS — I700 Atherosclerosis of aorta: Secondary | ICD-10-CM | POA: Diagnosis not present

## 2020-04-17 DIAGNOSIS — R310 Gross hematuria: Secondary | ICD-10-CM | POA: Diagnosis not present

## 2020-04-18 ENCOUNTER — Other Ambulatory Visit: Payer: Self-pay

## 2020-04-18 ENCOUNTER — Encounter: Payer: Self-pay | Admitting: Cardiovascular Disease

## 2020-04-18 ENCOUNTER — Ambulatory Visit: Payer: Medicare HMO | Admitting: Cardiovascular Disease

## 2020-04-18 DIAGNOSIS — E785 Hyperlipidemia, unspecified: Secondary | ICD-10-CM | POA: Diagnosis not present

## 2020-04-18 DIAGNOSIS — R030 Elevated blood-pressure reading, without diagnosis of hypertension: Secondary | ICD-10-CM

## 2020-04-18 DIAGNOSIS — I2510 Atherosclerotic heart disease of native coronary artery without angina pectoris: Secondary | ICD-10-CM

## 2020-04-18 DIAGNOSIS — Z9861 Coronary angioplasty status: Secondary | ICD-10-CM | POA: Diagnosis not present

## 2020-04-18 NOTE — Patient Instructions (Signed)
Medication Instructions:  Stop Plavix   *If you need a refill on your cardiac medications before your next appointment, please call your pharmacy*   Follow-Up: At [facility], you and your health needs are our priority.  As part of our continuing mission to provide you with exceptional heart care, we have created designated Provider Care Teams.  These Care Teams include your primary Cardiologist (physician) and Advanced Practice Providers (APPs -  Physician Assistants and Nurse Practitioners) who all work together to provide you with the care you need, when you need it.  We recommend signing up for the patient portal called "MyChart".  Sign up information is provided on this After Visit Summary.  MyChart is used to connect with patients for Virtual Visits (Telemedicine).  Patients are able to view lab/test results, encounter notes, upcoming appointments, etc.  Non-urgent messages can be sent to your provider as well.   To learn more about what you can do with MyChart, go to [email or web address].    Your next appointment:   6 month(s)  The format for your next appointment:   In Person  Provider:   Quay Burow, MD

## 2020-04-18 NOTE — Assessment & Plan Note (Signed)
History of CAD status post high-grade AV circumflex intervention by Dr. Ellyn Hack 01/04/2019.  She also had diffuse high-grade calcified RCA stenosis and underwent orbital atherectomy and stenting by Dr. Angelena Form 01/06/2019.  I performed radial cath on her 06/24/2019 revealing high-grade in-stent restenosis within the previously placed RCA stent which I was unable to cross.  Based on this she underwent CABG x1 with a vein graft to the distal RCA by Dr. Cyndia Bent and was discharged home 07/02/2019.  She is been doing well since that time.  Unfortunately she developed gross hematuria and I recommended that she discontinue her Plavix since it has been long enough since her intervention.

## 2020-04-18 NOTE — Progress Notes (Signed)
04/18/2020 Rachel Valentine   24-Jul-1941  [id number]  Primary Physician [name], MD Primary Cardiologist: [name] MD Garret Reddish, [place], [place]  HPI:  Rachel Valentine is a 79 y.o.  widowed Caucasian female mother of 2, grandmother and one grandchild referred to me initially by Dr. Hosie Poisson, her prior PCP. 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 She was having palpitations and chest pain. Her risk factors include hyperlipidemia and family history with a mother who had CABG. She is never had a heart attack or stroke. She was admitted to [facility] on 01/01/2023 unstable angina. She ruled out for myocardial infarction. She underwent cardiac catheterization by Dr. Ellyn Hack 01/04/2019 revealing normal LV function, high-grade mid AV groove circumflex on a band and high-grade diffuse calcified RCA stenosis with a fairly normal LAD. She underwent LAD intervention by Dr. Ellyn Hack with staged diamondback orbital rotational atherectomy, PCI and drug-eluting stenting of the proximal mid and distal RCA by Dr. Angelena Form on 01/06/2019. She was discharged home the following day. She is felt well and denies chest pain. She is on aspirin and Plavix as well as high-dose atorvastatin and a beta-blocker. She is a fairly active exerciserand currently is back walking 2.5 to 4 miles a day 6 to 7 days a week without limitation.  She underwent outpatient radial diagnostic coronary angiography by myself 06/24/2019 revealing high-grade in-stent restenosis within in the previously placed RCA stents which I was unable to cross.  Based on this she underwent CABG x1 with a vein graft to the distal RCA by Dr. Cyndia Bent during the same hospitalization and was discharged home on 07/02/2019.  She is done well since.  She gets occasional early evening palpitations.  Since I saw  her last she has been diagnosed with breast cancer and is been undergoing chemotherapy.  She is has 3 chemotherapy sessions left.  Recently however she is also developed gross hematuria for unclear reasons being worked up by Dr. Jeffie Pollock at [facility] urology.  She is had symptomatic anemia requiring transfusion of 2 units of packed red blood cells.   Current Meds  Medication Sig  . aspirin EC 81 MG tablet Take 81 mg by mouth daily.  . Biotin 10000 MCG TABS Take 10,000 mg by mouth daily.   . calcium citrate (CALCITRATE - DOSED IN MG ELEMENTAL CALCIUM) 950 (200 Ca) MG tablet Take 200 mg of elemental calcium by mouth 2 (two) times daily.   . Calcium-Phosphorus-Vitamin D (CITRACAL +D3 PO) Take 1 tablet by mouth in the morning and at bedtime.   . diphenhydrAMINE (BENADRYL) 25 mg capsule Take 25 mg by mouth at bedtime as needed for allergies.   Marland Kitchen lidocaine-prilocaine (EMLA) cream Apply to affected area once  . LORazepam (ATIVAN) 0.5 MG tablet Take 1 tablet (0.5 mg total) by mouth every 6 (six) hours as needed (Nausea or vomiting).  . metoprolol succinate (TOPROL-XL) 25 MG 24 hr tablet Take 25 mg by mouth daily.  . nitroGLYCERIN (NITROSTAT) 0.4 MG SL tablet Place 1 tablet (0.4 mg total) under the tongue every 5 (five) minutes as needed for chest pain.  Marland Kitchen ondansetron (ZOFRAN) 8 MG tablet Take 1 tablet (8 mg total) by mouth 2 (two) times daily as needed (Nausea or vomiting).  . pantoprazole (PROTONIX) 40 MG tablet TAKE 1 TABLET BY MOUTH EVERY DAY (Patient taking differently: Take 40 mg by mouth  daily. )  . prochlorperazine (COMPAZINE) 10 MG tablet Take 1 tablet (10 mg total) by mouth every 6 (six) hours as needed (Nausea or vomiting).  . Propylene Glycol (SYSTANE BALANCE) 0.6 % SOLN Place 2 drops into both eyes as needed (Dry eye).  . rosuvastatin (CRESTOR) 40 MG tablet Take 40 mg by mouth daily.   . [DISCONTINUED] clopidogrel (PLAVIX) 75 MG tablet Take 1 tablet (75 mg total) by mouth daily.     Allergies    Allergen Reactions  . Paclitaxel Rash  . Fosamax [Alendronate] Nausea Only    Unset stomach  . Prednisone Other (See Comments)    Blood pressure high when she takes it  . Clindamycin/Lincomycin Rash  . Penicillins Rash    Did it involve swelling of the face/tongue/throat, SOB, or low BP? No Did it involve sudden or severe rash/hives, skin peeling, or any reaction on the inside of your mouth or nose? No Did you need to seek medical attention at a hospital or doctor's office? Yes When did it last happen?Childhood If all above answers are "NO", may proceed with cephalosporin use.    Social History   Socioeconomic History  . Marital status: Widowed    Spouse name: Not on file  . Number of children: Not on file  . Years of education: Not on file  . Highest education level: Not on file  Occupational History  . Not on file  Tobacco Use  . Smoking status: Former Smoker    Quit date: 01/06/1999    Years since quitting: 21.2  . Smokeless tobacco: Never Used  Vaping Use  . Vaping Use: Never used  Substance and Sexual Activity  . Alcohol use: No  . Drug use: Never  . Sexual activity: Not on file  Other Topics Concern  . Not on file  Social History Narrative  . Not on file   Social Determinants of Health   Financial Resource Strain:   . Difficulty of Paying Living Expenses:   Food Insecurity:   . Worried About Charity fundraiser in the Last Year:   . Arboriculturist in the Last Year:   Transportation Needs:   . Film/video editor (Medical):   Marland Kitchen Lack of Transportation (Non-Medical):   Physical Activity:   . Days of Exercise per Week:   . Minutes of Exercise per Session:   Stress:   . Feeling of Stress :   Social Connections:   . Frequency of Communication with Friends and Family:   . Frequency of Social Gatherings with Friends and Family:   . Attends Religious Services:   . Active Member of Clubs or Organizations:   . Attends Archivist Meetings:    Marland Kitchen Marital Status:   Intimate Partner Violence:   . Fear of Current or Ex-Partner:   . Emotionally Abused:   Marland Kitchen Physically Abused:   . Sexually Abused:      Review of Systems: General: negative for chills, fever, night sweats or weight changes.  Cardiovascular: negative for chest pain, dyspnea on exertion, edema, orthopnea, palpitations, paroxysmal nocturnal dyspnea or shortness of breath Dermatological: negative for rash Respiratory: negative for cough or wheezing Urologic: negative for hematuria Abdominal: negative for nausea, vomiting, diarrhea, bright red blood per rectum, melena, or hematemesis Neurologic: negative for visual changes, syncope, or dizziness All other systems reviewed and are otherwise negative except as noted above.    Blood pressure 134/64, pulse 78, height 5\' 2"  (1.575 m), weight 103 lb  9.6 oz (47 kg), SpO2 97 %.  General appearance: alert and no distress Neck: no adenopathy, no carotid bruit, no JVD, supple, symmetrical, trachea midline and thyroid not enlarged, symmetric, no tenderness/mass/nodules Lungs: clear to auscultation bilaterally Heart: regular rate and rhythm, S1, S2 normal, no murmur, click, rub or gallop Extremities: extremities normal, atraumatic, no cyanosis or edema Pulses: 2+ and symmetric Skin: Skin color, texture, turgor normal. No rashes or lesions Neurologic: Alert and oriented X 3, normal strength and tone. Normal symmetric reflexes. Normal coordination and gait  EKG sinus rhythm at 78 without ST or T wave changes.  I personally reviewed this EKG.  ASSESSMENT AND PLAN:   CAD S/P percutaneous coronary angioplasty History of CAD status post high-grade AV circumflex intervention by Dr. Ellyn Hack 01/04/2019.  She also had diffuse high-grade calcified RCA stenosis and underwent orbital atherectomy and stenting by Dr. Angelena Form 01/06/2019.  I performed radial cath on her 06/24/2019 revealing high-grade in-stent restenosis within the previously  placed RCA stent which I was unable to cross.  Based on this she underwent CABG x1 with a vein graft to the distal RCA by Dr. Cyndia Bent and was discharged home 07/02/2019.  She is been doing well since that time.  Unfortunately she developed gross hematuria and I recommended that she discontinue her Plavix since it has been long enough since her intervention.  Dyslipidemia, goal LDL below 70 History of hyperlipidemia on statin therapy with lipid profile performed 06/27/2019 revealing total cholesterol 104, LDL 41 and HDL 49.  Elevated BP without diagnosis of hypertension History of essential potential blood pressure measured today 134/64.  She does keep her blood pressure log at home which I have reviewed and her blood pressures have been within a normal range off of amlodipine.  She is on metoprolol.      [name] MD FACP,FACC,FAHA, [facility] 04/18/2020 11:26 AM

## 2020-04-18 NOTE — Assessment & Plan Note (Signed)
History of hyperlipidemia on statin therapy with lipid profile performed 06/27/2019 revealing total cholesterol 104, LDL 41 and HDL 49.

## 2020-04-18 NOTE — Assessment & Plan Note (Signed)
History of essential potential blood pressure measured today 134/64.  She does keep her blood pressure log at home which I have reviewed and her blood pressures have been within a normal range off of amlodipine.  She is on metoprolol.

## 2020-04-19 NOTE — Progress Notes (Signed)
Patient Care Team: [name], MD as PCP - General (Family Medicine) [name], MD as PCP - Cardiology (Cardiology) [name], MD as Surgeon (General Surgery) [name], RN as Oncology Nurse Navigator Mauro Kaufmann, RN as Oncology Nurse Navigator  DIAGNOSIS:    ICD-10-CM   1. Malignant neoplasm of lower-outer quadrant of left breast of female, estrogen receptor positive ([place])  C50.512    Z17.0     SUMMARY OF ONCOLOGIC HISTORY: Oncology History  Malignant neoplasm of lower-outer quadrant of left breast of female, estrogen receptor positive ([place])  01/18/2020 Initial Diagnosis   Palpable left breast lump with calcifications at 5 o'clock position measuring 3.3 cm.  Biopsy revealed grade 3 IDC ER 95%, PR 0%, HER-2 3+ positive, Ki-67 50%, axilla negative, T2N0   01/26/2020 Cancer Staging   Staging form: Breast, AJCC 8th Edition - Clinical stage from 01/26/2020: Stage IIA (cT2, cN0, cM0, G3, ER+, PR-, HER2+) - Signed by Nicholas Lose, MD on 01/26/2020   02/16/2020 -  Chemotherapy   The patient had PACLitaxel-protein bound (ABRAXANE) chemo infusion 125 mg, 80 mg/m2 = 125 mg (100 % of original dose 80 mg/m2), Intravenous,  Once, 2 of 2 cycles Dose modification: 80 mg/m2 (original dose 80 mg/m2, Cycle 2) Administration: 125 mg (03/22/2020), 125 mg (03/29/2020), 125 mg (04/06/2020), 125 mg (04/12/2020) PACLitaxel (TAXOL) 120 mg in sodium chloride 0.9 % 250 mL chemo infusion (</= 79m/m2), 80 mg/m2 = 120 mg, Intravenous,  Once, 1 of 1 cycle Administration: 120 mg (02/16/2020), 120 mg (02/23/2020), 120 mg (03/01/2020), 120 mg (03/08/2020) trastuzumab-anns (KANJINTI) 189 mg in sodium chloride 0.9 % 250 mL chemo infusion, 4 mg/kg = 189 mg, Intravenous,  Once, 3 of 3 cycles Administration: 189 mg (02/16/2020), 105 mg (02/23/2020), 105 mg (03/01/2020), 105 mg (03/08/2020), 105 mg (03/15/2020), 105 mg (03/22/2020), 105 mg (03/29/2020), 105 mg (04/06/2020), 105 mg (04/12/2020)  for  chemotherapy treatment.      CHIEF COMPLIANT: Cycle9AbraxaneHerceptin  INTERVAL HISTORY: Rachel Shoemakeris a 79y.o. with above-mentioned history ofleft breastcancer currently on neoadjuvant chemotherapy withAbraxane andHerceptin.She presents to the clinic todayfora toxicity checkandcycle9. She continues to have hematuria and has seen urology.  There is a clinical suspicion that she may have a renal cell tumor.  She has underwent a contrasted CT last Monday and is awaiting to hear the results.  We will transfuse 2 units of PRBC last week and she felt much better although she still is fairly weak.  She does not have any signs or symptoms of neuropathy. She has mild breakout of rash on her hands and the face but it is extremely mild.  ALLERGIES:  is allergic to paclitaxel, fosamax [alendronate], prednisone, clindamycin/lincomycin, and penicillins.  MEDICATIONS:  Current Outpatient Medications  Medication Sig Dispense Refill  . aspirin EC 81 MG tablet Take 81 mg by mouth daily.    . Biotin 10000 MCG TABS Take 10,000 mg by mouth daily.     . calcium citrate (CALCITRATE - DOSED IN MG ELEMENTAL CALCIUM) 950 (200 Ca) MG tablet Take 200 mg of elemental calcium by mouth 2 (two) times daily.     . Calcium-Phosphorus-Vitamin D (CITRACAL +D3 PO) Take 1 tablet by mouth in the morning and at bedtime.     . diphenhydrAMINE (BENADRYL) 25 mg capsule Take 25 mg by mouth at bedtime as needed for allergies.     .Marland Kitchenlidocaine-prilocaine (EMLA) cream Apply to affected area once 30 g 3  . LORazepam (ATIVAN) 0.5 MG  tablet Take 1 tablet (0.5 mg total) by mouth every 6 (six) hours as needed (Nausea or vomiting). 30 tablet 0  . metoprolol succinate (TOPROL-XL) 25 MG 24 hr tablet Take 25 mg by mouth daily.    . nitroGLYCERIN (NITROSTAT) 0.4 MG SL tablet Place 1 tablet (0.4 mg total) under the tongue every 5 (five) minutes as needed for chest pain. 30 tablet 0  . ondansetron (ZOFRAN) 8 MG tablet  Take 1 tablet (8 mg total) by mouth 2 (two) times daily as needed (Nausea or vomiting). 30 tablet 1  . pantoprazole (PROTONIX) 40 MG tablet TAKE 1 TABLET BY MOUTH EVERY DAY (Patient taking differently: Take 40 mg by mouth daily. ) 90 tablet 2  . prochlorperazine (COMPAZINE) 10 MG tablet Take 1 tablet (10 mg total) by mouth every 6 (six) hours as needed (Nausea or vomiting). 30 tablet 1  . Propylene Glycol (SYSTANE BALANCE) 0.6 % SOLN Place 2 drops into both eyes as needed (Dry eye).    . rosuvastatin (CRESTOR) 40 MG tablet Take 40 mg by mouth daily.      No current facility-administered medications for this visit.    PHYSICAL EXAMINATION: ECOG PERFORMANCE STATUS: 1 - Symptomatic but completely ambulatory  Vitals:   04/20/20 1334  BP: (!) 153/68  Pulse: 80  Resp: 18  Temp: 97.9 F (36.6 C)  SpO2: 100%   Filed Weights   04/20/20 1334  Weight: 104 lb 4.8 oz (47.3 kg)    LABORATORY DATA:  I have reviewed the data as listed CMP Latest Ref Rng & Units 04/12/2020 04/06/2020 03/29/2020  Glucose 70 - 99 mg/dL 112(H) 115(H) 93  BUN 8 - 23 mg/dL 19 19 16   Creatinine 0.44 - 1.00 mg/dL 0.85 0.96 0.71  Sodium 135 - 145 mmol/L 139 138 140  Potassium 3.5 - 5.1 mmol/L 3.7 3.4(L) 4.4  Chloride 98 - 111 mmol/L 105 103 105  CO2 22 - 32 mmol/L 25 26 28   Calcium 8.9 - 10.3 mg/dL 9.6 9.5 10.1  Total Protein 6.5 - 8.1 g/dL 6.2(L) 6.7 6.8  Total Bilirubin 0.3 - 1.2 mg/dL 0.3 0.4 0.3  Alkaline Phos 38 - 126 U/L 67 69 74  AST 15 - 41 U/L 16 16 18   ALT 0 - 44 U/L <6 <6 8    Lab Results  Component Value Date   WBC 4.5 04/20/2020   HGB 9.0 (L) 04/20/2020   HCT 28.0 (L) 04/20/2020   MCV 92.4 04/20/2020   PLT 470 (H) 04/20/2020   NEUTROABS 2.7 04/20/2020    ASSESSMENT & PLAN:  Malignant neoplasm of lower-outer quadrant of left breast of female, estrogen receptor positive ([facility]) /18/2021:Palpable left breast lump with calcifications at 5 o'clock position measuring 3.3 cm. Biopsy revealed grade 3  IDC ER 95%, PR 0%, HER-2 3+ positive, Ki-67 50%, axilla negative, T2N0 stage IIa clinical stage Bilateral implants removed 2005  Treatment plan: 1.Neo- adjuvant chemotherapy with Taxol Herceptin 2.Breast conserving surgery with sentinel lymph node biopsy 3.Adjuvant radiation therapy followed by 4.Adjuvant antiestrogen therapywith anastrozole 1 mg daily x5 years  Breast MRI: 2.3 cm mass, indeterminate 8 mm mass (Biopsy: ADH), no LN --------------------------------------------------------------------------------------------------------------------------------------------------------------- Current Treatment: Cycle10Abraxane-Herceptin(Taxol discontinued because of rashsubstituted with Abraxane)  Chemo toxicities: Chemo induced anemia:Today's hemoglobin is 9: We will transfuse 2 units of PRBC last week Fatigue: Secondary to severe anemia Today's ANC is 2.7 Fixed drug eruption: Due to Taxol.Taxol discontinued once starting Abraxane. Rash is better  Hematuria: Has seen urology.  They are suspecting urothelial  malignancy.  CT abdomen with contrast was performed and we are waiting for the results.  This was done at the [facility] office and we do not have access to that record.  Return to clinic for weekly Abraxane Herceptin    No orders of the defined types were placed in this encounter.  The patient has a good understanding of the overall plan. she agrees with it. she will call with any problems that may develop before the next visit here.  Total time spent: 30 mins including face to face time and time spent for planning, charting and coordination of care  Nicholas Lose, MD 04/20/2020  I, Cloyde Reams Dorshimer, am acting as scribe for Dr. Nicholas Lose.  I have reviewed the above documentation for accuracy and completeness, and I agree with the above.

## 2020-04-20 ENCOUNTER — Inpatient Hospital Stay: Payer: Medicare HMO

## 2020-04-20 ENCOUNTER — Other Ambulatory Visit: Payer: Self-pay

## 2020-04-20 ENCOUNTER — Inpatient Hospital Stay: Payer: Medicare HMO | Admitting: Hematology and Oncology

## 2020-04-20 DIAGNOSIS — Z95828 Presence of other vascular implants and grafts: Secondary | ICD-10-CM

## 2020-04-20 DIAGNOSIS — C50512 Malignant neoplasm of lower-outer quadrant of left female breast: Secondary | ICD-10-CM | POA: Diagnosis not present

## 2020-04-20 DIAGNOSIS — Z170 Estrogen receptor positive status [ER+]: Secondary | ICD-10-CM

## 2020-04-20 DIAGNOSIS — Z5111 Encounter for antineoplastic chemotherapy: Secondary | ICD-10-CM | POA: Diagnosis not present

## 2020-04-20 LAB — CBC WITH DIFFERENTIAL (CANCER CENTER ONLY)
Abs Immature Granulocytes: 0.03 10*3/uL (ref 0.00–0.07)
Basophils Absolute: 0 10*3/uL (ref 0.0–0.1)
Basophils Relative: 1 %
Eosinophils Absolute: 0.3 10*3/uL (ref 0.0–0.5)
Eosinophils Relative: 6 %
HCT: 28 % — ABNORMAL LOW (ref 36.0–46.0)
Hemoglobin: 9 g/dL — ABNORMAL LOW (ref 12.0–15.0)
Immature Granulocytes: 1 %
Lymphocytes Relative: 24 %
Lymphs Abs: 1.1 10*3/uL (ref 0.7–4.0)
MCH: 29.7 pg (ref 26.0–34.0)
MCHC: 32.1 g/dL (ref 30.0–36.0)
MCV: 92.4 fL (ref 80.0–100.0)
Monocytes Absolute: 0.4 10*3/uL (ref 0.1–1.0)
Monocytes Relative: 9 %
Neutro Abs: 2.7 10*3/uL (ref 1.7–7.7)
Neutrophils Relative %: 59 %
Platelet Count: 470 10*3/uL — ABNORMAL HIGH (ref 150–400)
RBC: 3.03 MIL/uL — ABNORMAL LOW (ref 3.87–5.11)
RDW: 15.8 % — ABNORMAL HIGH (ref 11.5–15.5)
WBC Count: 4.5 10*3/uL (ref 4.0–10.5)
nRBC: 0 % (ref 0.0–0.2)

## 2020-04-20 LAB — CMP (CANCER CENTER ONLY)
ALT: 7 U/L (ref 0–44)
AST: 24 U/L (ref 15–41)
Albumin: 3.4 g/dL — ABNORMAL LOW (ref 3.5–5.0)
Alkaline Phosphatase: 76 U/L (ref 38–126)
Anion gap: 8 (ref 5–15)
BUN: 18 mg/dL (ref 8–23)
CO2: 26 mmol/L (ref 22–32)
Calcium: 9.1 mg/dL (ref 8.9–10.3)
Chloride: 105 mmol/L (ref 98–111)
Creatinine: 0.78 mg/dL (ref 0.44–1.00)
GFR, Est AFR Am: >60 mL/min (ref >60)
GFR, Estimated: >60 mL/min (ref >60)
Glucose, Bld: 106 mg/dL — ABNORMAL HIGH (ref 70–99)
Potassium: 4.1 mmol/L (ref 3.5–5.1)
Sodium: 139 mmol/L (ref 135–145)
Total Bilirubin: 0.3 mg/dL (ref 0.3–1.2)
Total Protein: 6.1 g/dL — ABNORMAL LOW (ref 6.5–8.1)

## 2020-04-20 LAB — SAMPLE TO BLOOD BANK

## 2020-04-20 MED ORDER — DIPHENHYDRAMINE HCL 50 MG/ML IJ SOLN
INTRAMUSCULAR | Status: AC
  Filled 2020-04-20: qty 1

## 2020-04-20 MED ORDER — SODIUM CHLORIDE 0.9% FLUSH
10.0000 mL | Freq: Once | INTRAVENOUS | Status: AC
  Administered 2020-04-20: 10 mL
  Filled 2020-04-20: qty 10

## 2020-04-20 MED ORDER — HEPARIN SOD (PORK) LOCK FLUSH 100 UNIT/ML IV SOLN
500.0000 [IU] | Freq: Once | INTRAVENOUS | Status: AC | PRN
  Administered 2020-04-20: 500 [IU]
  Filled 2020-04-20: qty 5

## 2020-04-20 MED ORDER — ACETAMINOPHEN 325 MG PO TABS
650.0000 mg | ORAL_TABLET | Freq: Once | ORAL | Status: AC
  Administered 2020-04-20: 650 mg via ORAL

## 2020-04-20 MED ORDER — SODIUM CHLORIDE 0.9% FLUSH
10.0000 mL | INTRAVENOUS | Status: DC | PRN
  Administered 2020-04-20: 10 mL
  Filled 2020-04-20: qty 10

## 2020-04-20 MED ORDER — ACETAMINOPHEN 325 MG PO TABS
ORAL_TABLET | ORAL | Status: AC
  Filled 2020-04-20: qty 2

## 2020-04-20 MED ORDER — SODIUM CHLORIDE 0.9 % IV SOLN
Freq: Once | INTRAVENOUS | Status: AC
  Filled 2020-04-20: qty 250

## 2020-04-20 MED ORDER — DIPHENHYDRAMINE HCL 50 MG/ML IJ SOLN
25.0000 mg | Freq: Once | INTRAMUSCULAR | Status: AC
  Administered 2020-04-20: 25 mg via INTRAVENOUS

## 2020-04-20 MED ORDER — PROCHLORPERAZINE MALEATE 10 MG PO TABS
10.0000 mg | ORAL_TABLET | Freq: Once | ORAL | Status: AC
  Administered 2020-04-20: 10 mg via ORAL

## 2020-04-20 MED ORDER — PROCHLORPERAZINE MALEATE 10 MG PO TABS
ORAL_TABLET | ORAL | Status: AC
  Filled 2020-04-20: qty 1

## 2020-04-20 MED ORDER — TRASTUZUMAB-ANNS CHEMO 150 MG IV SOLR
2.0000 mg/kg | Freq: Once | INTRAVENOUS | Status: AC
  Administered 2020-04-20: 105 mg via INTRAVENOUS
  Filled 2020-04-20: qty 5

## 2020-04-20 MED ORDER — PACLITAXEL PROTEIN-BOUND CHEMO INJECTION 100 MG
80.0000 mg/m2 | Freq: Once | INTRAVENOUS | Status: AC
  Administered 2020-04-20: 125 mg via INTRAVENOUS
  Filled 2020-04-20: qty 25

## 2020-04-20 NOTE — Assessment & Plan Note (Signed)
/  18/2021:Palpable left breast lump with calcifications at 5 o'clock position measuring 3.3 cm. Biopsy revealed grade 3 IDC ER 95%, PR 0%, HER-2 3+ positive, Ki-67 50%, axilla negative, T2N0 stage IIa clinical stage Bilateral implants removed 2005  Treatment plan: 1.Neo- adjuvant chemotherapy with Taxol Herceptin 2.Breast conserving surgery with sentinel lymph node biopsy 3.Adjuvant radiation therapy followed by 4.Adjuvant antiestrogen therapywith anastrozole 1 mg daily x5 years  Breast MRI: 2.3 cm mass, indeterminate 8 mm mass (Biopsy: ADH), no LN --------------------------------------------------------------------------------------------------------------------------------------------------------------- Current Treatment: Cycle10Abraxane-Herceptin(Taxol discontinued because of rashsubstituted with Abraxane)  Chemo toxicities: Chemo induced anemia:Today's hemoglobin is 7.4: We will try to transfuse 2 units of PRBC at the next available opportunity. Fatigue: Secondary to severe anemia Today's ANC is 3.7 Fixed drug eruption: Due to Taxol.Taxol discontinued once starting Abraxane. Rash is better  Hematuria: Has seen urology.  They are suspecting urothelial malignancy.  Return to clinic for 2 more doses of chemotherapy.

## 2020-04-20 NOTE — Patient Instructions (Signed)
[facility] Discharge Instructions for Patients Receiving Chemotherapy  Today you received the following immunotherapy agent: Trastuzumab (Kanjinti) and chemotherapy agent: Paclitaxel protein bound (Abraxane)  To help prevent nausea and vomiting after your treatment, we encourage you to take your nausea medication as directed by your MD.   If you develop nausea and vomiting that is not controlled by your nausea medication, call the clinic.   BELOW ARE SYMPTOMS THAT SHOULD BE REPORTED IMMEDIATELY:  *FEVER GREATER THAN 100.5 F  *CHILLS WITH OR WITHOUT FEVER  NAUSEA AND VOMITING THAT IS NOT CONTROLLED WITH YOUR NAUSEA MEDICATION  *UNUSUAL SHORTNESS OF BREATH  *UNUSUAL BRUISING OR BLEEDING  TENDERNESS IN MOUTH AND THROAT WITH OR WITHOUT PRESENCE OF ULCERS  *URINARY PROBLEMS  *BOWEL PROBLEMS  UNUSUAL RASH Items with * indicate a potential emergency and should be followed up as soon as possible.  Feel free to call the clinic should you have any questions or concerns. The clinic phone number is [phone].  Please show the [facility] at check-in to the Emergency Department and triage nurse.

## 2020-04-21 ENCOUNTER — Other Ambulatory Visit: Payer: Self-pay | Admitting: *Deleted

## 2020-04-24 ENCOUNTER — Encounter: Payer: Self-pay | Admitting: *Deleted

## 2020-04-25 ENCOUNTER — Other Ambulatory Visit: Payer: Self-pay | Admitting: *Deleted

## 2020-04-25 NOTE — Progress Notes (Signed)
Patient Care Team: [name], MD as PCP - General (Family Medicine) [name], MD as PCP - Cardiology (Cardiology) [name], MD as Surgeon (General Surgery) [name], RN as Oncology Nurse Navigator Mauro Kaufmann, RN as Oncology Nurse Navigator  DIAGNOSIS:    ICD-10-CM   1. Malignant neoplasm of lower-outer quadrant of left breast of female, estrogen receptor positive ([place])  C50.512 MR BREAST BILATERAL W WO CONTRAST INC CAD   Z17.0     SUMMARY OF ONCOLOGIC HISTORY: Oncology History  Malignant neoplasm of lower-outer quadrant of left breast of female, estrogen receptor positive ([place])  01/18/2020 Initial Diagnosis   Palpable left breast lump with calcifications at 5 o'clock position measuring 3.3 cm.  Biopsy revealed grade 3 IDC ER 95%, PR 0%, HER-2 3+ positive, Ki-67 50%, axilla negative, T2N0   01/26/2020 Cancer Staging   Staging form: Breast, AJCC 8th Edition - Clinical stage from 01/26/2020: Stage IIA (cT2, cN0, cM0, G3, ER+, PR-, HER2+) - Signed by Nicholas Lose, MD on 01/26/2020   02/16/2020 -  Chemotherapy   Rachel patient had PACLitaxel-protein bound (ABRAXANE) chemo infusion 125 mg, 80 mg/m2 = 125 mg (100 % of original dose 80 mg/m2), Intravenous,  Once, 2 of 2 cycles Dose modification: 80 mg/m2 (original dose 80 mg/m2, Cycle 2) Administration: 125 mg (03/22/2020), 125 mg (03/29/2020), 125 mg (04/06/2020), 125 mg (04/12/2020), 125 mg (04/20/2020) PACLitaxel (TAXOL) 120 mg in sodium chloride 0.9 % 250 mL chemo infusion (</= 45m/m2), 80 mg/m2 = 120 mg, Intravenous,  Once, 1 of 1 cycle Administration: 120 mg (02/16/2020), 120 mg (02/23/2020), 120 mg (03/01/2020), 120 mg (03/08/2020) trastuzumab-anns (KANJINTI) 189 mg in sodium chloride 0.9 % 250 mL chemo infusion, 4 mg/kg = 189 mg, Intravenous,  Once, 3 of 3 cycles Dose modification: 6 mg/kg (original dose 2 mg/kg, Cycle 3, Reason: Other (see comments), Comment: maintenance Trastuzumab to  start) Administration: 189 mg (02/16/2020), 105 mg (02/23/2020), 105 mg (03/01/2020), 105 mg (03/08/2020), 105 mg (03/15/2020), 105 mg (03/22/2020), 105 mg (03/29/2020), 105 mg (04/06/2020), 105 mg (04/12/2020), 105 mg (04/20/2020)  for chemotherapy treatment.      CHIEF COMPLIANT: Cycle10AbraxaneHerceptin  INTERVAL HISTORY: SPearlena Owis a 79y.o. with above-mentioned history of left breastcancer currently on neoadjuvant chemotherapy withAbraxane andHerceptin.She presents to Rachel clinic todayfora toxicity checkandcycle10. She tells me that over Rachel past week she has not had any further hematuria.  CT scan done at Rachel urologist office suggested area in Rachel right renal pelvis where there could be a tumor.  Rachel retrograde urethroscopy is going to be planned.  She denies any worsening neuropathy.  Denies any nausea vomiting.  She continues to have some moderate to severe fatigue.   ALLERGIES:  is allergic to paclitaxel, fosamax [alendronate], prednisone, clindamycin/lincomycin, and penicillins.  MEDICATIONS:  Current Outpatient Medications  Medication Sig Dispense Refill  . aspirin EC 81 MG tablet Take 81 mg by mouth daily.    . Biotin 10000 MCG TABS Take 10,000 mg by mouth daily.     . calcium citrate (CALCITRATE - DOSED IN MG ELEMENTAL CALCIUM) 950 (200 Ca) MG tablet Take 200 mg of elemental calcium by mouth 2 (two) times daily.     . Calcium-Phosphorus-Vitamin D (CITRACAL +D3 PO) Take 1 tablet by mouth in Rachel morning and at bedtime.     . diphenhydrAMINE (BENADRYL) 25 mg capsule Take 25 mg by mouth at bedtime as needed for allergies.     .Marland Kitchenlidocaine-prilocaine (EMLA) cream Apply to  affected area once 30 g 3  . LORazepam (ATIVAN) 0.5 MG tablet Take 1 tablet (0.5 mg total) by mouth every 6 (six) hours as needed (Nausea or vomiting). 30 tablet 0  . metoprolol succinate (TOPROL-XL) 25 MG 24 hr tablet Take 25 mg by mouth daily.    . nitroGLYCERIN (NITROSTAT) 0.4 MG SL tablet Place 1  tablet (0.4 mg total) under Rachel tongue every 5 (five) minutes as needed for chest pain. 30 tablet 0  . ondansetron (ZOFRAN) 8 MG tablet Take 1 tablet (8 mg total) by mouth 2 (two) times daily as needed (Nausea or vomiting). 30 tablet 1  . pantoprazole (PROTONIX) 40 MG tablet TAKE 1 TABLET BY MOUTH EVERY DAY (Patient taking differently: Take 40 mg by mouth daily. ) 90 tablet 2  . prochlorperazine (COMPAZINE) 10 MG tablet Take 1 tablet (10 mg total) by mouth every 6 (six) hours as needed (Nausea or vomiting). 30 tablet 1  . Propylene Glycol (SYSTANE BALANCE) 0.6 % SOLN Place 2 drops into both eyes as needed (Dry eye).    . rosuvastatin (CRESTOR) 40 MG tablet Take 40 mg by mouth daily.      No current facility-administered medications for this visit.   Facility-Administered Medications Ordered in Other Visits  Medication Dose Route Frequency Provider Last Rate Last Admin  . heparin lock flush 100 unit/mL  500 Units Intracatheter Once PRN Nicholas Lose, MD      . PACLitaxel-protein bound (ABRAXANE) chemo infusion 125 mg  80 mg/m2 (Treatment Plan Recorded) Intravenous Once Nicholas Lose, MD      . sodium chloride flush (NS) 0.9 % injection 10 mL  10 mL Intracatheter PRN Nicholas Lose, MD      . Theotis Burrow [facility]) 105 mg in sodium chloride 0.9 % 250 mL chemo infusion  2 mg/kg (Treatment Plan Recorded) Intravenous Once Nicholas Lose, MD        PHYSICAL EXAMINATION: ECOG PERFORMANCE STATUS: 1 - Symptomatic Rachel completely ambulatory  Vitals:   04/26/20 0952  BP: (!) 134/56  Pulse: 72  Resp: 18  Temp: (!) 97.3 F (36.3 C)  SpO2: 100%   Filed Weights   04/26/20 0952  Weight: 103 lb 4.8 oz (46.9 kg)    LABORATORY DATA:  I have reviewed Rachel data as listed CMP Latest Ref Rng & Units 04/26/2020 04/20/2020 04/12/2020  Glucose 70 - 99 mg/dL 117(H) 106(H) 112(H)  BUN 8 - 23 mg/dL 19 18 19   Creatinine 0.44 - 1.00 mg/dL 0.75 0.78 0.85  Sodium 135 - 145 mmol/L 141 139 139  Potassium 3.5 -  5.1 mmol/L 3.3(L) 4.1 3.7  Chloride 98 - 111 mmol/L 107 105 105  CO2 22 - 32 mmol/L 27 26 25   Calcium 8.9 - 10.3 mg/dL 9.7 9.1 9.6  Total Protein 6.5 - 8.1 g/dL 6.4(L) 6.1(L) 6.2(L)  Total Bilirubin 0.3 - 1.2 mg/dL 0.3 0.3 0.3  Alkaline Phos 38 - 126 U/L 72 76 67  AST 15 - 41 U/L 16 24 16   ALT 0 - 44 U/L 6 7 <6    Lab Results  Component Value Date   WBC 3.6 (L) 04/26/2020   HGB 9.2 (L) 04/26/2020   HCT 28.6 (L) 04/26/2020   MCV 91.7 04/26/2020   PLT 464 (H) 04/26/2020   NEUTROABS 2.2 04/26/2020    ASSESSMENT & PLAN:  Malignant neoplasm of lower-outer quadrant of left breast of female, estrogen receptor positive ([place]) 01/18/2020:Palpable left breast lump with calcifications at 5 o'clock position measuring 3.3 cm. Biopsy  revealed grade 3 IDC ER 95%, PR 0%, HER-2 3+ positive, Ki-67 50%, axilla negative, T2N0 stage IIa clinical stage Bilateral implants removed 2005  Treatment plan: 1.Neo- adjuvant chemotherapy with Taxol Herceptin 2.Breast conserving surgery with sentinel lymph node biopsy 3.Adjuvant radiation therapy followed by 4.Adjuvant antiestrogen therapywith anastrozole 1 mg daily x5 years  Breast MRI: 2.3 cm mass, indeterminate 8 mm mass (Biopsy: ADH), no LN --------------------------------------------------------------------------------------------------------------------------------------------------------------- Current Treatment: Cycle10Abraxane-Herceptin(Taxol discontinued because of rashsubstituted with Abraxane)  Chemo toxicities: Chemo induced anemia:Today's hemoglobin is9: We will transfuse 2 units of PRBC last week Fatigue:Secondary to severe anemia Today's ANC is2.7 Fixed drug eruption:Due to Taxol.Taxol discontinued once starting Abraxane. Rash is better  Hematuria: Has seen urology.    CT of Rachel abdomen revealed right renal pelvis to have an area of suspicion for urothelial malignancy versus blood clot.  She will need a retrograde  urethroscopy.  Return to clinic for weekly Abraxane Herceptin    Orders Placed This Encounter  Procedures  . MR BREAST BILATERAL W WO CONTRAST INC CAD    Standing Status:   Future    Standing Expiration Date:   04/26/2021    Order Specific Question:   If indicated for Rachel ordered procedure, I authorize Rachel administration of contrast media per Radiology protocol    Answer:   Yes    Order Specific Question:   What is Rachel patient's sedation requirement?    Answer:   No Sedation    Order Specific Question:   Does Rachel patient have a pacemaker or implanted devices?    Answer:   No    Order Specific Question:   Radiology Contrast Protocol - do NOT remove file path    Answer:   [email or web address]    Order Specific Question:   Preferred imaging location?    Answer:   [facility] (table limit-550lbs)   Rachel patient has a good understanding of Rachel overall plan. she agrees with Rachel. she will call with any problems that may develop before Rachel next visit here.  Total time spent: 30 mins including face to face time and time spent for planning, charting and coordination of care  Nicholas Lose, MD 04/26/2020  I, Cloyde Reams Dorshimer, am acting as scribe for Dr. Nicholas Lose.  I have reviewed Rachel above documentation for accuracy and completeness, and I agree with Rachel above.

## 2020-04-26 ENCOUNTER — Inpatient Hospital Stay: Payer: Medicare HMO

## 2020-04-26 ENCOUNTER — Other Ambulatory Visit: Payer: Self-pay

## 2020-04-26 ENCOUNTER — Inpatient Hospital Stay: Payer: Medicare HMO | Admitting: Hematology and Oncology

## 2020-04-26 DIAGNOSIS — Z170 Estrogen receptor positive status [ER+]: Secondary | ICD-10-CM

## 2020-04-26 DIAGNOSIS — Z5111 Encounter for antineoplastic chemotherapy: Secondary | ICD-10-CM | POA: Diagnosis not present

## 2020-04-26 DIAGNOSIS — C50512 Malignant neoplasm of lower-outer quadrant of left female breast: Secondary | ICD-10-CM | POA: Diagnosis not present

## 2020-04-26 DIAGNOSIS — Z95828 Presence of other vascular implants and grafts: Secondary | ICD-10-CM

## 2020-04-26 LAB — CBC WITH DIFFERENTIAL (CANCER CENTER ONLY)
Abs Immature Granulocytes: 0.04 10*3/uL (ref 0.00–0.07)
Basophils Absolute: 0.1 10*3/uL (ref 0.0–0.1)
Basophils Relative: 2 %
Eosinophils Absolute: 0.2 10*3/uL (ref 0.0–0.5)
Eosinophils Relative: 4 %
HCT: 28.6 % — ABNORMAL LOW (ref 36.0–46.0)
Hemoglobin: 9.2 g/dL — ABNORMAL LOW (ref 12.0–15.0)
Immature Granulocytes: 1 %
Lymphocytes Relative: 23 %
Lymphs Abs: 0.9 10*3/uL (ref 0.7–4.0)
MCH: 29.5 pg (ref 26.0–34.0)
MCHC: 32.2 g/dL (ref 30.0–36.0)
MCV: 91.7 fL (ref 80.0–100.0)
Monocytes Absolute: 0.3 10*3/uL (ref 0.1–1.0)
Monocytes Relative: 8 %
Neutro Abs: 2.2 10*3/uL (ref 1.7–7.7)
Neutrophils Relative %: 62 %
Platelet Count: 464 10*3/uL — ABNORMAL HIGH (ref 150–400)
RBC: 3.12 MIL/uL — ABNORMAL LOW (ref 3.87–5.11)
RDW: 15.6 % — ABNORMAL HIGH (ref 11.5–15.5)
WBC Count: 3.6 10*3/uL — ABNORMAL LOW (ref 4.0–10.5)
nRBC: 0 % (ref 0.0–0.2)

## 2020-04-26 LAB — CMP (CANCER CENTER ONLY)
ALT: 6 U/L (ref 0–44)
AST: 16 U/L (ref 15–41)
Albumin: 3.7 g/dL (ref 3.5–5.0)
Alkaline Phosphatase: 72 U/L (ref 38–126)
Anion gap: 7 (ref 5–15)
BUN: 19 mg/dL (ref 8–23)
CO2: 27 mmol/L (ref 22–32)
Calcium: 9.7 mg/dL (ref 8.9–10.3)
Chloride: 107 mmol/L (ref 98–111)
Creatinine: 0.75 mg/dL (ref 0.44–1.00)
GFR, Est AFR Am: >60 mL/min (ref >60)
GFR, Estimated: >60 mL/min (ref >60)
Glucose, Bld: 117 mg/dL — ABNORMAL HIGH (ref 70–99)
Potassium: 3.3 mmol/L — ABNORMAL LOW (ref 3.5–5.1)
Sodium: 141 mmol/L (ref 135–145)
Total Bilirubin: 0.3 mg/dL (ref 0.3–1.2)
Total Protein: 6.4 g/dL — ABNORMAL LOW (ref 6.5–8.1)

## 2020-04-26 MED ORDER — ACETAMINOPHEN 325 MG PO TABS
ORAL_TABLET | ORAL | Status: AC
  Filled 2020-04-26: qty 2

## 2020-04-26 MED ORDER — ACETAMINOPHEN 325 MG PO TABS
650.0000 mg | ORAL_TABLET | Freq: Once | ORAL | Status: AC
  Administered 2020-04-26: 650 mg via ORAL

## 2020-04-26 MED ORDER — TRASTUZUMAB-ANNS CHEMO 150 MG IV SOLR
2.0000 mg/kg | Freq: Once | INTRAVENOUS | Status: AC
  Administered 2020-04-26: 105 mg via INTRAVENOUS
  Filled 2020-04-26: qty 5

## 2020-04-26 MED ORDER — PROCHLORPERAZINE MALEATE 10 MG PO TABS
10.0000 mg | ORAL_TABLET | Freq: Once | ORAL | Status: AC
  Administered 2020-04-26: 10 mg via ORAL

## 2020-04-26 MED ORDER — PACLITAXEL PROTEIN-BOUND CHEMO INJECTION 100 MG
80.0000 mg/m2 | Freq: Once | INTRAVENOUS | Status: AC
  Administered 2020-04-26: 125 mg via INTRAVENOUS
  Filled 2020-04-26: qty 25

## 2020-04-26 MED ORDER — SODIUM CHLORIDE 0.9% FLUSH
10.0000 mL | Freq: Once | INTRAVENOUS | Status: AC
  Administered 2020-04-26: 10 mL
  Filled 2020-04-26: qty 10

## 2020-04-26 MED ORDER — PALONOSETRON HCL INJECTION 0.25 MG/5ML
INTRAVENOUS | Status: AC
  Filled 2020-04-26: qty 5

## 2020-04-26 MED ORDER — SODIUM CHLORIDE 0.9% FLUSH
10.0000 mL | INTRAVENOUS | Status: DC | PRN
  Administered 2020-04-26: 10 mL
  Filled 2020-04-26: qty 10

## 2020-04-26 MED ORDER — HEPARIN SOD (PORK) LOCK FLUSH 100 UNIT/ML IV SOLN
500.0000 [IU] | Freq: Once | INTRAVENOUS | Status: AC | PRN
  Administered 2020-04-26: 500 [IU]
  Filled 2020-04-26: qty 5

## 2020-04-26 MED ORDER — DIPHENHYDRAMINE HCL 50 MG/ML IJ SOLN
INTRAMUSCULAR | Status: AC
  Filled 2020-04-26: qty 1

## 2020-04-26 MED ORDER — PROCHLORPERAZINE MALEATE 10 MG PO TABS
ORAL_TABLET | ORAL | Status: AC
  Filled 2020-04-26: qty 1

## 2020-04-26 MED ORDER — SODIUM CHLORIDE 0.9 % IV SOLN
Freq: Once | INTRAVENOUS | Status: AC
  Filled 2020-04-26: qty 250

## 2020-04-26 MED ORDER — DIPHENHYDRAMINE HCL 50 MG/ML IJ SOLN
25.0000 mg | Freq: Once | INTRAMUSCULAR | Status: AC
  Administered 2020-04-26: 25 mg via INTRAVENOUS

## 2020-04-26 NOTE — Patient Instructions (Signed)
[place] Discharge Instructions for Patients Receiving Chemotherapy  Today you received the following chemotherapy agents: Abraxane and Herceptin   To help prevent nausea and vomiting after your treatment, we encourage you to take your nausea medication  as prescribed.    If you develop nausea and vomiting that is not controlled by your nausea medication, call the clinic.   BELOW ARE SYMPTOMS THAT SHOULD BE REPORTED IMMEDIATELY:  *FEVER GREATER THAN 100.5 F  *CHILLS WITH OR WITHOUT FEVER  NAUSEA AND VOMITING THAT IS NOT CONTROLLED WITH YOUR NAUSEA MEDICATION  *UNUSUAL SHORTNESS OF BREATH  *UNUSUAL BRUISING OR BLEEDING  TENDERNESS IN MOUTH AND THROAT WITH OR WITHOUT PRESENCE OF ULCERS  *URINARY PROBLEMS  *BOWEL PROBLEMS  UNUSUAL RASH Items with * indicate a potential emergency and should be followed up as soon as possible.  Feel free to call the clinic should you have any questions or concerns. The clinic phone number is [phone].  Please show the [place] at check-in to the Emergency Department and triage nurse.

## 2020-04-26 NOTE — Assessment & Plan Note (Signed)
01/18/2020:Palpable left breast lump with calcifications at 5 o'clock position measuring 3.3 cm. Biopsy revealed grade 3 IDC ER 95%, PR 0%, HER-2 3+ positive, Ki-67 50%, axilla negative, T2N0 stage IIa clinical stage Bilateral implants removed 2005  Treatment plan: 1.Neo- adjuvant chemotherapy with Taxol Herceptin 2.Breast conserving surgery with sentinel lymph node biopsy 3.Adjuvant radiation therapy followed by 4.Adjuvant antiestrogen therapywith anastrozole 1 mg daily x5 years  Breast MRI: 2.3 cm mass, indeterminate 8 mm mass (Biopsy: ADH), no LN --------------------------------------------------------------------------------------------------------------------------------------------------------------- Current Treatment: Cycle10Abraxane-Herceptin(Taxol discontinued because of rashsubstituted with Abraxane)  Chemo toxicities: Chemo induced anemia:Today's hemoglobin is9: We will transfuse 2 units of PRBC last week Fatigue:Secondary to severe anemia Today's ANC is2.7 Fixed drug eruption:Due to Taxol.Taxol discontinued once starting Abraxane. Rash is better  Hematuria: Has seen urology.  They are suspecting urothelial malignancy.  CT abdomen with contrast was performed and we are waiting for the results.  This was done at the [facility] urology office and we do not have access to that record.  Return to clinic for weekly Abraxane Herceptin

## 2020-04-27 ENCOUNTER — Telehealth: Payer: Self-pay | Admitting: Hematology and Oncology

## 2020-04-27 ENCOUNTER — Ambulatory Visit (HOSPITAL_COMMUNITY): Payer: Medicare HMO

## 2020-04-27 NOTE — Telephone Encounter (Signed)
No 8/25 los, no changes made to pt schedule   

## 2020-05-03 ENCOUNTER — Other Ambulatory Visit: Payer: Self-pay | Admitting: *Deleted

## 2020-05-03 DIAGNOSIS — Z006 Encounter for examination for normal comparison and control in clinical research program: Secondary | ICD-10-CM

## 2020-05-04 ENCOUNTER — Other Ambulatory Visit: Payer: Medicare HMO

## 2020-05-04 ENCOUNTER — Ambulatory Visit: Payer: Medicare HMO

## 2020-05-04 ENCOUNTER — Ambulatory Visit: Payer: Medicare HMO | Admitting: Hematology and Oncology

## 2020-05-04 ENCOUNTER — Encounter: Payer: Self-pay | Admitting: *Deleted

## 2020-05-04 DIAGNOSIS — R310 Gross hematuria: Secondary | ICD-10-CM | POA: Diagnosis not present

## 2020-05-04 DIAGNOSIS — D4101 Neoplasm of uncertain behavior of right kidney: Secondary | ICD-10-CM | POA: Diagnosis not present

## 2020-05-04 NOTE — Progress Notes (Signed)
Patient Care Team: [name], MD as PCP - General (Family Medicine) [name], MD as PCP - Cardiology (Cardiology) [name], MD as Surgeon (General Surgery) [name], RN as Oncology Nurse Navigator Mauro Kaufmann, RN as Oncology Nurse Navigator  DIAGNOSIS:    ICD-10-CM   1. Malignant neoplasm of lower-outer quadrant of left breast of female, estrogen receptor positive ([place])  C50.512    Z17.0     SUMMARY OF ONCOLOGIC HISTORY: Oncology History  Malignant neoplasm of lower-outer quadrant of left breast of female, estrogen receptor positive ([place])  01/18/2020 Initial Diagnosis   Palpable left breast lump with calcifications at 5 o'clock position measuring 3.3 cm.  Biopsy revealed grade 3 IDC ER 95%, PR 0%, HER-2 3+ positive, Ki-67 50%, axilla negative, T2N0   01/26/2020 Cancer Staging   Staging form: Breast, AJCC 8th Edition - Clinical stage from 01/26/2020: Stage IIA (cT2, cN0, cM0, G3, ER+, PR-, HER2+) - Signed by Nicholas Lose, MD on 01/26/2020   02/16/2020 -  Chemotherapy   The patient had PACLitaxel-protein bound (ABRAXANE) chemo infusion 125 mg, 80 mg/m2 = 125 mg (100 % of original dose 80 mg/m2), Intravenous,  Once, 2 of 2 cycles Dose modification: 80 mg/m2 (original dose 80 mg/m2, Cycle 2) Administration: 125 mg (03/22/2020), 125 mg (03/29/2020), 125 mg (04/06/2020), 125 mg (04/12/2020), 125 mg (04/20/2020), 125 mg (04/26/2020) PACLitaxel (TAXOL) 120 mg in sodium chloride 0.9 % 250 mL chemo infusion (</= 51m/m2), 80 mg/m2 = 120 mg, Intravenous,  Once, 1 of 1 cycle Administration: 120 mg (02/16/2020), 120 mg (02/23/2020), 120 mg (03/01/2020), 120 mg (03/08/2020) trastuzumab-anns (KANJINTI) 189 mg in sodium chloride 0.9 % 250 mL chemo infusion, 4 mg/kg = 189 mg, Intravenous,  Once, 3 of 3 cycles Dose modification: 6 mg/kg (original dose 2 mg/kg, Cycle 3, Reason: Other (see comments), Comment: maintenance Trastuzumab to start) Administration: 189 mg (02/16/2020),  105 mg (02/23/2020), 105 mg (03/01/2020), 105 mg (03/08/2020), 105 mg (03/15/2020), 105 mg (03/22/2020), 105 mg (03/29/2020), 105 mg (04/06/2020), 105 mg (04/12/2020), 105 mg (04/20/2020), 105 mg (04/26/2020)  for chemotherapy treatment.      CHIEF COMPLIANT: Cycle11AbraxaneHerceptin  INTERVAL HISTORY: Rachel Valentine a 79y.o. with above-mentioned history of left breastcancer currently on neoadjuvant chemotherapy withAbraxane andHerceptin.She presents to the clinic todayfora toxicity checkandcycle11.  Unusually she had felt nauseated after the last cycle of chemo.  She unfortunately could not take any antinausea medication.  She reports to be feeling fatigued.  Denied any abdominal pain.  The urinary bleeding has subsided.  ALLERGIES:  is allergic to paclitaxel, fosamax [alendronate], prednisone, clindamycin/lincomycin, and penicillins.  MEDICATIONS:  Current Outpatient Medications  Medication Sig Dispense Refill  . aspirin EC 81 MG tablet Take 81 mg by mouth daily.    . Biotin 10000 MCG TABS Take 10,000 mg by mouth daily.     . calcium citrate (CALCITRATE - DOSED IN MG ELEMENTAL CALCIUM) 950 (200 Ca) MG tablet Take 200 mg of elemental calcium by mouth 2 (two) times daily.     . Calcium-Phosphorus-Vitamin D (CITRACAL +D3 PO) Take 1 tablet by mouth in the morning and at bedtime.     . diphenhydrAMINE (BENADRYL) 25 mg capsule Take 25 mg by mouth at bedtime as needed for allergies.     .Marland Kitchenlidocaine-prilocaine (EMLA) cream Apply to affected area once 30 g 3  . LORazepam (ATIVAN) 0.5 MG tablet Take 1 tablet (0.5 mg total) by mouth every 6 (six) hours as needed (Nausea or  vomiting). 30 tablet 0  . metoprolol succinate (TOPROL-XL) 25 MG 24 hr tablet Take 25 mg by mouth daily.    . nitroGLYCERIN (NITROSTAT) 0.4 MG SL tablet Place 1 tablet (0.4 mg total) under the tongue every 5 (five) minutes as needed for chest pain. 30 tablet 0  . ondansetron (ZOFRAN) 8 MG tablet Take 1 tablet (8 mg  total) by mouth 2 (two) times daily as needed (Nausea or vomiting). 30 tablet 1  . pantoprazole (PROTONIX) 40 MG tablet TAKE 1 TABLET BY MOUTH EVERY DAY (Patient taking differently: Take 40 mg by mouth daily. ) 90 tablet 2  . prochlorperazine (COMPAZINE) 10 MG tablet Take 1 tablet (10 mg total) by mouth every 6 (six) hours as needed (Nausea or vomiting). 30 tablet 1  . Propylene Glycol (SYSTANE BALANCE) 0.6 % SOLN Place 2 drops into both eyes as needed (Dry eye).    . rosuvastatin (CRESTOR) 40 MG tablet Take 40 mg by mouth daily.      No current facility-administered medications for this visit.    PHYSICAL EXAMINATION: ECOG PERFORMANCE STATUS: 1 - Symptomatic but completely ambulatory  Vitals:   05/05/20 1224  BP: (!) 150/70  Pulse: 79  Resp: 18  Temp: (!) 96.5 F (35.8 C)  SpO2: 100%   Filed Weights   05/05/20 1224  Weight: 103 lb 14.4 oz (47.1 kg)     LABORATORY DATA:  I have reviewed the data as listed CMP Latest Ref Rng & Units 04/26/2020 04/20/2020 04/12/2020  Glucose 70 - 99 mg/dL 117(H) 106(H) 112(H)  BUN 8 - 23 mg/dL _0 Creatinine 0.44 - 1.00 mg/dL 0.75 0.78 0.85  Sodium 135 - 145 mmol/L 141 139 139  Potassium 3.5 - 5.1 mmol/L 3.3(L) 4.1 3.7  Chloride 98 - 111 mmol/L 107 105 105  CO2 22 - 32 mmol/L _1 Calcium 8.9 - 10.3 mg/dL 9.7 9.1 9.6  Total Protein 6.5 - 8.1 g/dL 6.4(L) 6.1(L) 6.2(L)  Total Bilirubin 0.3 - 1.2 mg/dL 0.3 0.3 0.3  Alkaline Phos 38 - 126 U/L 72 76 67  AST 15 - 41 U/L _2 ALT 0 - 44 U/L 6 7 <6    Lab Results  Component Value Date   WBC 4.7 05/05/2020   HGB 9.3 (L) 05/05/2020   HCT 29.8 (L) 05/05/2020   MCV 93.1 05/05/2020   PLT 422 (H) 05/05/2020   NEUTROABS 2.8 05/05/2020    ASSESSMENT & PLAN:  Malignant neoplasm of lower-outer quadrant of left breast of female, estrogen receptor positive ([place]) 01/18/2020:Palpable left breast lump with calcifications at 5 o'clock position measuring 3.3 cm. Biopsy revealed grade 3 IDC  ER 95%, PR 0%, HER-2 3+ positive, Ki-67 50%, axilla negative, T2N0 stage IIa clinical stage Bilateral implants removed 2005  Treatment plan: 1.Neo- adjuvant chemotherapy with Taxol Herceptin 2.Breast conserving surgery with sentinel lymph node biopsy 3.Adjuvant radiation therapy followed by 4.Adjuvant antiestrogen therapywith anastrozole 1 mg daily x5 years  Breast MRI: 2.3 cm mass, indeterminate 8 mm mass (Biopsy: ADH), no LN --------------------------------------------------------------------------------------------------------------------------------------------------------------- Current Treatment: Cycle11Abraxane-Herceptin(Taxol discontinued because of rashsubstituted with Abraxane)  Chemo toxicities: Chemo induced anemia:Today's hemoglobin is9.3 Fatigue:Secondary to severe anemia Today's ANC is2.8 Fixed drug eruption:Due to Taxol.Taxol discontinued once starting Abraxane. Rash is better  Hematuria: Has seen urology. CT of the abdomen revealed right renal pelvis to have an area of suspicion for urothelial malignancy versus blood clot.  She may need a retrograde urethroscopy.  Return to clinic forcycle 12  of Abraxane Herceptin next week.    No orders of the defined types were placed in this encounter.  The patient has a good understanding of the overall plan. she agrees with it. she will call with any problems that may develop before the next visit here.  Total time spent: 30 mins including face to face time and time spent for planning, charting and coordination of care  Nicholas Lose, MD 05/05/2020  I, Cloyde Reams Dorshimer, am acting as scribe for Dr. Nicholas Lose.  I have reviewed the above documentation for accuracy and completeness, and I agree with the above.

## 2020-05-05 ENCOUNTER — Inpatient Hospital Stay: Payer: Medicare HMO | Admitting: Hematology and Oncology

## 2020-05-05 ENCOUNTER — Other Ambulatory Visit: Payer: Self-pay | Admitting: *Deleted

## 2020-05-05 ENCOUNTER — Inpatient Hospital Stay: Payer: Medicare HMO | Attending: Hematology and Oncology

## 2020-05-05 ENCOUNTER — Other Ambulatory Visit: Payer: Self-pay

## 2020-05-05 ENCOUNTER — Inpatient Hospital Stay: Payer: Medicare HMO

## 2020-05-05 ENCOUNTER — Inpatient Hospital Stay: Payer: Medicare HMO | Admitting: *Deleted

## 2020-05-05 DIAGNOSIS — Z170 Estrogen receptor positive status [ER+]: Secondary | ICD-10-CM

## 2020-05-05 DIAGNOSIS — Z5111 Encounter for antineoplastic chemotherapy: Secondary | ICD-10-CM | POA: Diagnosis not present

## 2020-05-05 DIAGNOSIS — C50512 Malignant neoplasm of lower-outer quadrant of left female breast: Secondary | ICD-10-CM

## 2020-05-05 DIAGNOSIS — Z79899 Other long term (current) drug therapy: Secondary | ICD-10-CM | POA: Insufficient documentation

## 2020-05-05 DIAGNOSIS — Z006 Encounter for examination for normal comparison and control in clinical research program: Secondary | ICD-10-CM

## 2020-05-05 DIAGNOSIS — Z95828 Presence of other vascular implants and grafts: Secondary | ICD-10-CM

## 2020-05-05 LAB — CMP (CANCER CENTER ONLY)
ALT: <6 U/L (ref 0–44)
AST: 17 U/L (ref 15–41)
Albumin: 4 g/dL (ref 3.5–5.0)
Alkaline Phosphatase: 74 U/L (ref 38–126)
Anion gap: 8 (ref 5–15)
BUN: 24 mg/dL — ABNORMAL HIGH (ref 8–23)
CO2: 25 mmol/L (ref 22–32)
Calcium: 9.2 mg/dL (ref 8.9–10.3)
Chloride: 105 mmol/L (ref 98–111)
Creatinine: 0.68 mg/dL (ref 0.44–1.00)
GFR, Est AFR Am: >60 mL/min (ref >60)
GFR, Estimated: >60 mL/min (ref >60)
Glucose, Bld: 114 mg/dL — ABNORMAL HIGH (ref 70–99)
Potassium: 3.8 mmol/L (ref 3.5–5.1)
Sodium: 138 mmol/L (ref 135–145)
Total Bilirubin: 0.4 mg/dL (ref 0.3–1.2)
Total Protein: 6.6 g/dL (ref 6.5–8.1)

## 2020-05-05 LAB — CBC WITH DIFFERENTIAL (CANCER CENTER ONLY)
Abs Immature Granulocytes: 0.05 10*3/uL (ref 0.00–0.07)
Basophils Absolute: 0.1 10*3/uL (ref 0.0–0.1)
Basophils Relative: 2 %
Eosinophils Absolute: 0.1 10*3/uL (ref 0.0–0.5)
Eosinophils Relative: 2 %
HCT: 29.8 % — ABNORMAL LOW (ref 36.0–46.0)
Hemoglobin: 9.3 g/dL — ABNORMAL LOW (ref 12.0–15.0)
Immature Granulocytes: 1 %
Lymphocytes Relative: 22 %
Lymphs Abs: 1 10*3/uL (ref 0.7–4.0)
MCH: 29.1 pg (ref 26.0–34.0)
MCHC: 31.2 g/dL (ref 30.0–36.0)
MCV: 93.1 fL (ref 80.0–100.0)
Monocytes Absolute: 0.6 10*3/uL (ref 0.1–1.0)
Monocytes Relative: 13 %
Neutro Abs: 2.8 10*3/uL (ref 1.7–7.7)
Neutrophils Relative %: 60 %
Platelet Count: 422 10*3/uL — ABNORMAL HIGH (ref 150–400)
RBC: 3.2 MIL/uL — ABNORMAL LOW (ref 3.87–5.11)
RDW: 17.1 % — ABNORMAL HIGH (ref 11.5–15.5)
WBC Count: 4.7 10*3/uL (ref 4.0–10.5)
nRBC: 0 % (ref 0.0–0.2)

## 2020-05-05 LAB — RESEARCH LABS

## 2020-05-05 MED ORDER — TRASTUZUMAB-ANNS CHEMO 150 MG IV SOLR
300.0000 mg | Freq: Once | INTRAVENOUS | Status: AC
  Administered 2020-05-05: 300 mg via INTRAVENOUS
  Filled 2020-05-05: qty 14.29

## 2020-05-05 MED ORDER — PROCHLORPERAZINE MALEATE 10 MG PO TABS
10.0000 mg | ORAL_TABLET | Freq: Once | ORAL | Status: AC
  Administered 2020-05-05: 10 mg via ORAL

## 2020-05-05 MED ORDER — PROCHLORPERAZINE MALEATE 10 MG PO TABS
ORAL_TABLET | ORAL | Status: AC
  Filled 2020-05-05: qty 1

## 2020-05-05 MED ORDER — ACETAMINOPHEN 325 MG PO TABS
ORAL_TABLET | ORAL | Status: AC
  Filled 2020-05-05: qty 2

## 2020-05-05 MED ORDER — DIPHENHYDRAMINE HCL 50 MG/ML IJ SOLN
25.0000 mg | Freq: Once | INTRAMUSCULAR | Status: AC
  Administered 2020-05-05: 25 mg via INTRAVENOUS

## 2020-05-05 MED ORDER — SODIUM CHLORIDE 0.9% FLUSH
10.0000 mL | Freq: Once | INTRAVENOUS | Status: AC
  Administered 2020-05-05: 10 mL
  Filled 2020-05-05: qty 10

## 2020-05-05 MED ORDER — ACETAMINOPHEN 325 MG PO TABS
650.0000 mg | ORAL_TABLET | Freq: Once | ORAL | Status: AC
  Administered 2020-05-05: 650 mg via ORAL

## 2020-05-05 MED ORDER — SODIUM CHLORIDE 0.9% FLUSH
10.0000 mL | INTRAVENOUS | Status: DC | PRN
  Administered 2020-05-05: 10 mL
  Filled 2020-05-05: qty 10

## 2020-05-05 MED ORDER — DIPHENHYDRAMINE HCL 50 MG/ML IJ SOLN
INTRAMUSCULAR | Status: AC
  Filled 2020-05-05: qty 1

## 2020-05-05 MED ORDER — HEPARIN SOD (PORK) LOCK FLUSH 100 UNIT/ML IV SOLN
500.0000 [IU] | Freq: Once | INTRAVENOUS | Status: AC | PRN
  Administered 2020-05-05: 500 [IU]
  Filled 2020-05-05: qty 5

## 2020-05-05 MED ORDER — PACLITAXEL PROTEIN-BOUND CHEMO INJECTION 100 MG
80.0000 mg/m2 | Freq: Once | INTRAVENOUS | Status: AC
  Administered 2020-05-05: 125 mg via INTRAVENOUS
  Filled 2020-05-05: qty 25

## 2020-05-05 MED ORDER — SODIUM CHLORIDE 0.9 % IV SOLN
Freq: Once | INTRAVENOUS | Status: AC
  Filled 2020-05-05: qty 250

## 2020-05-05 NOTE — Assessment & Plan Note (Signed)
01/18/2020:Palpable left breast lump with calcifications at 5 o'clock position measuring 3.3 cm. Biopsy revealed grade 3 IDC ER 95%, PR 0%, HER-2 3+ positive, Ki-67 50%, axilla negative, T2N0 stage IIa clinical stage Bilateral implants removed 2005  Treatment plan: 1.Neo- adjuvant chemotherapy with Taxol Herceptin 2.Breast conserving surgery with sentinel lymph node biopsy 3.Adjuvant radiation therapy followed by 4.Adjuvant antiestrogen therapywith anastrozole 1 mg daily x5 years  Breast MRI: 2.3 cm mass, indeterminate 8 mm mass (Biopsy: ADH), no LN --------------------------------------------------------------------------------------------------------------------------------------------------------------- Current Treatment: Cycle12Abraxane-Herceptin(Taxol discontinued because of rashsubstituted with Abraxane)  Chemo toxicities: Chemo induced anemia:Today's hemoglobin is9:We will transfuse 2 units of PRBC last week Fatigue:Secondary to severe anemia Today's ANC is2.7 Fixed drug eruption:Due to Taxol.Taxol discontinued once starting Abraxane. Rash is better  Hematuria: Has seen urology. CT of the abdomen revealed right renal pelvis to have an area of suspicion for urothelial malignancy versus blood clot.  She will need a retrograde urethroscopy.  Return to clinic forweekly Abraxane Herceptin

## 2020-05-05 NOTE — Patient Instructions (Signed)

## 2020-05-05 NOTE — Patient Instructions (Signed)
[facility] Discharge Instructions for Patients Receiving Chemotherapy  Today you received the following chemotherapy agents: trastuzumab and paclitaxel protein-bound.  To help prevent nausea and vomiting after your treatment, we encourage you to take your nausea medication as directed.   If you develop nausea and vomiting that is not controlled by your nausea medication, call the clinic.   BELOW ARE SYMPTOMS THAT SHOULD BE REPORTED IMMEDIATELY:  *FEVER GREATER THAN 100.5 F  *CHILLS WITH OR WITHOUT FEVER  NAUSEA AND VOMITING THAT IS NOT CONTROLLED WITH YOUR NAUSEA MEDICATION  *UNUSUAL SHORTNESS OF BREATH  *UNUSUAL BRUISING OR BLEEDING  TENDERNESS IN MOUTH AND THROAT WITH OR WITHOUT PRESENCE OF ULCERS  *URINARY PROBLEMS  *BOWEL PROBLEMS  UNUSUAL RASH Items with * indicate a potential emergency and should be followed up as soon as possible.  Feel free to call the clinic should you have any questions or concerns. The clinic phone number is [phone].  Please show the [place] at check-in to the Emergency Department and triage nurse.

## 2020-05-05 NOTE — Research (Signed)
U0454, A PROSPECTIVE OBSERVATIONAL COHORT STUDY TO DEVELOP A PREDICTIVE MODEL OF TAXANE-INDUCED PERIPHERAL NEUROPATHY IN CANCER PATIENTS. Week 12 ASSESSMENTS:    PROs; Questionnaires given to patient to complete in clinic. Collected questionnaires and checked for completeness and accuracy.  Labs; Optional research labs collected per protocol along with other standard of care labs ordered by provider.   Solicited Neuropathy Events Form; Patient reports feeling some numbness and tingling in her lips. Dr. Lindi Adie states this is not related to taxane. Form completed and signed by Dr. Lindi Adie. Physician Toxicity Burden Form; Completed by Dr. Lindi Adie.  Timed Get Up and Go Test; Not required this visit.  History of Falls; Not required this visit.  Concomitant Medications;  Reviewed medications with patient. She continues to take Tylenol PRN for pain once or twice a day. She is not taking any anti-depressants, gabapentin, narcotics, tramadol, duloxetine or ibuprofen. She is also not taking any Vitamin E, Glutamine, Vitamin B6, Fish Oil or Vitamin B12. Topical Agents and other Treatments Patient is using UdderCream with Urea 40% to her hands and feet twice daily to help prevent neuropathy.  Patient has not continued with icing hands and feet since Taxol was discontinued. Patient has not used any complementary or alternative medicines since starting treatment.  Neuropen Assessment; Completed per protocol by this certified research RN with assistance recording by Otilio Miu, RN.   [place] Assessment; Completed per protocol by this certified research RN with assistance timing and recording by Otilio Miu, RN.  Treatment:  Patient continues with Abraxane and Herceptin today.  Last dose of paclitaxel was on 03/08/20 and was discontinued due to a rash.  Plan: Thanked patient for her participation in this study today. Next study visit due in 12 weeks.  Informed patient we will schedule the study visit same day she  is in clinic for treatment.  Encouraged her to call clinic if any questions or concerns prior to next appointment. She verbalized understanding.   Foye Spurling, BSN, [place]

## 2020-05-09 ENCOUNTER — Other Ambulatory Visit: Payer: Self-pay | Admitting: Hematology and Oncology

## 2020-05-09 ENCOUNTER — Telehealth: Payer: Self-pay | Admitting: Hematology and Oncology

## 2020-05-09 NOTE — Progress Notes (Signed)
Patient Care Team: [name], MD as PCP - General (Family Medicine) [name], MD as PCP - Cardiology (Cardiology) [name], MD as Surgeon (General Surgery) [name], RN as Oncology Nurse Navigator Mauro Kaufmann, RN as Oncology Nurse Navigator  DIAGNOSIS: No diagnosis found.  SUMMARY OF ONCOLOGIC HISTORY: Oncology History  Malignant neoplasm of lower-outer quadrant of left breast of female, estrogen receptor positive ([place])  01/18/2020 Initial Diagnosis   Palpable left breast lump with calcifications at 5 o'clock position measuring 3.3 cm.  Biopsy revealed grade 3 IDC ER 95%, PR 0%, HER-2 3+ positive, Ki-67 50%, axilla negative, T2N0   01/26/2020 Cancer Staging   Staging form: Breast, AJCC 8th Edition - Clinical stage from 01/26/2020: Stage IIA (cT2, cN0, cM0, G3, ER+, PR-, HER2+) - Signed by Nicholas Lose, MD on 01/26/2020   02/16/2020 -  Chemotherapy   The patient had PACLitaxel-protein bound (ABRAXANE) chemo infusion 125 mg, 80 mg/m2 = 125 mg (100 % of original dose 80 mg/m2), Intravenous,  Once, 2 of 3 cycles Dose modification: 80 mg/m2 (original dose 80 mg/m2, Cycle 2) Administration: 125 mg (03/22/2020), 125 mg (03/29/2020), 125 mg (04/06/2020), 125 mg (04/12/2020), 125 mg (04/20/2020), 125 mg (04/26/2020), 125 mg (05/05/2020) PACLitaxel (TAXOL) 120 mg in sodium chloride 0.9 % 250 mL chemo infusion (</= 32m/m2), 80 mg/m2 = 120 mg, Intravenous,  Once, 1 of 1 cycle Administration: 120 mg (02/16/2020), 120 mg (02/23/2020), 120 mg (03/01/2020), 120 mg (03/08/2020) trastuzumab-anns (KANJINTI) 189 mg in sodium chloride 0.9 % 250 mL chemo infusion, 4 mg/kg = 189 mg, Intravenous,  Once, 3 of 4 cycles Dose modification: 6 mg/kg (original dose 2 mg/kg, Cycle 3, Reason: Other (see comments), Comment: maintenance Trastuzumab to start) Administration: 189 mg (02/16/2020), 105 mg (02/23/2020), 105 mg (03/01/2020), 105 mg (03/08/2020), 105 mg (03/15/2020), 105 mg (03/22/2020), 105 mg  (03/29/2020), 105 mg (04/06/2020), 105 mg (04/12/2020), 105 mg (04/20/2020), 105 mg (04/26/2020), 300 mg (05/05/2020)  for chemotherapy treatment.      CHIEF COMPLIANT: Cycle12 AbraxaneHerceptin on 05/13/2020  INTERVAL HISTORY: [name] a 79y.o. with above-mentioned history of left breastcancer currently on neoadjuvant chemotherapy withAbraxane andHerceptin.She presents to the clinic todayfora toxicity checkandcycle12 to be done on this Saturday.  She had done much better with cycle 11 of treatment.  She has noticed mild tingling and numbness of the fingers.  ALLERGIES:  is allergic to paclitaxel, fosamax [alendronate], prednisone, clindamycin/lincomycin, and penicillins.  MEDICATIONS:  Current Outpatient Medications  Medication Sig Dispense Refill  . aspirin EC 81 MG tablet Take 81 mg by mouth daily.    . Biotin 10000 MCG TABS Take 10,000 mg by mouth daily.     . calcium citrate (CALCITRATE - DOSED IN MG ELEMENTAL CALCIUM) 950 (200 Ca) MG tablet Take 200 mg of elemental calcium by mouth 2 (two) times daily.     . Calcium-Phosphorus-Vitamin D (CITRACAL +D3 PO) Take 1 tablet by mouth in the morning and at bedtime.     . diphenhydrAMINE (BENADRYL) 25 mg capsule Take 25 mg by mouth at bedtime as needed for allergies.     .Marland Kitchenlidocaine-prilocaine (EMLA) cream Apply to affected area once 30 g 3  . LORazepam (ATIVAN) 0.5 MG tablet Take 1 tablet (0.5 mg total) by mouth every 6 (six) hours as needed (Nausea or vomiting). 30 tablet 0  . metoprolol succinate (TOPROL-XL) 25 MG 24 hr tablet Take 25 mg by mouth daily.    . nitroGLYCERIN (NITROSTAT) 0.4 MG SL  tablet Place 1 tablet (0.4 mg total) under the tongue every 5 (five) minutes as needed for chest pain. 30 tablet 0  . ondansetron (ZOFRAN) 8 MG tablet Take 1 tablet (8 mg total) by mouth 2 (two) times daily as needed (Nausea or vomiting). 30 tablet 1  . pantoprazole (PROTONIX) 40 MG tablet TAKE 1 TABLET BY MOUTH EVERY DAY  (Patient taking differently: Take 40 mg by mouth daily. ) 90 tablet 2  . prochlorperazine (COMPAZINE) 10 MG tablet Take 1 tablet (10 mg total) by mouth every 6 (six) hours as needed (Nausea or vomiting). 30 tablet 1  . Propylene Glycol (SYSTANE BALANCE) 0.6 % SOLN Place 2 drops into both eyes as needed (Dry eye).    . rosuvastatin (CRESTOR) 40 MG tablet Take 40 mg by mouth daily.      No current facility-administered medications for this visit.    PHYSICAL EXAMINATION: ECOG PERFORMANCE STATUS: 1 - Symptomatic but completely ambulatory  Vitals:   05/10/20 1518  BP: (!) 149/61  Pulse: 85  Resp: 17  Temp: (!) 97.2 F (36.2 C)  SpO2: 99%    LABORATORY DATA:  I have reviewed the data as listed CMP Latest Ref Rng & Units 05/05/2020 04/26/2020 04/20/2020  Glucose 70 - 99 mg/dL 114(H) 117(H) 106(H)  BUN 8 - 23 mg/dL 24(H) 19 18  Creatinine 0.44 - 1.00 mg/dL 0.68 0.75 0.78  Sodium 135 - 145 mmol/L 138 141 139  Potassium 3.5 - 5.1 mmol/L 3.8 3.3(L) 4.1  Chloride 98 - 111 mmol/L 105 107 105  CO2 22 - 32 mmol/L _0 Calcium 8.9 - 10.3 mg/dL 9.2 9.7 9.1  Total Protein 6.5 - 8.1 g/dL 6.6 6.4(L) 6.1(L)  Total Bilirubin 0.3 - 1.2 mg/dL 0.4 0.3 0.3  Alkaline Phos 38 - 126 U/L 74 72 76  AST 15 - 41 U/L _1 ALT 0 - 44 U/L <_2 Lab Results  Component Value Date   WBC 4.7 05/10/2020   HGB 9.4 (L) 05/10/2020   HCT 29.9 (L) 05/10/2020   MCV 92.3 05/10/2020   PLT 368 05/10/2020   NEUTROABS 3.1 05/10/2020    ASSESSMENT & PLAN:  No problem-specific Assessment & Plan notes found for this encounter.    No orders of the defined types were placed in this encounter.  The patient has a good understanding of the overall plan. she agrees with it. she will call with any problems that may develop before the next visit here.  Total time spent: 30 mins including face to face time and time spent for planning, charting and coordination of care  Nicholas Lose, MD 05/10/2020  I, Cloyde Reams  Dorshimer, am acting as scribe for Dr. Nicholas Lose.  I have reviewed the above documentation for accuracy and completeness, and I agree with the above.

## 2020-05-09 NOTE — Telephone Encounter (Signed)
Scheduled appt per 9/7 sch msg - pt is aware of appt on 9/8 and infusion on 9/11

## 2020-05-10 ENCOUNTER — Telehealth: Payer: Self-pay | Admitting: Hematology and Oncology

## 2020-05-10 ENCOUNTER — Other Ambulatory Visit: Payer: Self-pay

## 2020-05-10 ENCOUNTER — Inpatient Hospital Stay: Payer: Medicare HMO | Admitting: Hematology and Oncology

## 2020-05-10 ENCOUNTER — Inpatient Hospital Stay: Payer: Medicare HMO

## 2020-05-10 DIAGNOSIS — Z5111 Encounter for antineoplastic chemotherapy: Secondary | ICD-10-CM | POA: Diagnosis not present

## 2020-05-10 DIAGNOSIS — C50512 Malignant neoplasm of lower-outer quadrant of left female breast: Secondary | ICD-10-CM

## 2020-05-10 DIAGNOSIS — Z170 Estrogen receptor positive status [ER+]: Secondary | ICD-10-CM

## 2020-05-10 DIAGNOSIS — Z95828 Presence of other vascular implants and grafts: Secondary | ICD-10-CM

## 2020-05-10 LAB — CBC WITH DIFFERENTIAL (CANCER CENTER ONLY)
Abs Immature Granulocytes: 0.02 10*3/uL (ref 0.00–0.07)
Basophils Absolute: 0.1 10*3/uL (ref 0.0–0.1)
Basophils Relative: 1 %
Eosinophils Absolute: 0.1 10*3/uL (ref 0.0–0.5)
Eosinophils Relative: 2 %
HCT: 29.9 % — ABNORMAL LOW (ref 36.0–46.0)
Hemoglobin: 9.4 g/dL — ABNORMAL LOW (ref 12.0–15.0)
Immature Granulocytes: 0 %
Lymphocytes Relative: 24 %
Lymphs Abs: 1.1 10*3/uL (ref 0.7–4.0)
MCH: 29 pg (ref 26.0–34.0)
MCHC: 31.4 g/dL (ref 30.0–36.0)
MCV: 92.3 fL (ref 80.0–100.0)
Monocytes Absolute: 0.3 10*3/uL (ref 0.1–1.0)
Monocytes Relative: 6 %
Neutro Abs: 3.1 10*3/uL (ref 1.7–7.7)
Neutrophils Relative %: 67 %
Platelet Count: 368 10*3/uL (ref 150–400)
RBC: 3.24 MIL/uL — ABNORMAL LOW (ref 3.87–5.11)
RDW: 16.6 % — ABNORMAL HIGH (ref 11.5–15.5)
WBC Count: 4.7 10*3/uL (ref 4.0–10.5)
nRBC: 0 % (ref 0.0–0.2)

## 2020-05-10 LAB — CMP (CANCER CENTER ONLY)
ALT: 8 U/L (ref 0–44)
AST: 20 U/L (ref 15–41)
Albumin: 4 g/dL (ref 3.5–5.0)
Alkaline Phosphatase: 72 U/L (ref 38–126)
Anion gap: 6 (ref 5–15)
BUN: 19 mg/dL (ref 8–23)
CO2: 27 mmol/L (ref 22–32)
Calcium: 9.2 mg/dL (ref 8.9–10.3)
Chloride: 107 mmol/L (ref 98–111)
Creatinine: 0.75 mg/dL (ref 0.44–1.00)
GFR, Est AFR Am: >60 mL/min (ref >60)
GFR, Estimated: >60 mL/min (ref >60)
Glucose, Bld: 123 mg/dL — ABNORMAL HIGH (ref 70–99)
Potassium: 4.2 mmol/L (ref 3.5–5.1)
Sodium: 140 mmol/L (ref 135–145)
Total Bilirubin: 0.4 mg/dL (ref 0.3–1.2)
Total Protein: 6.5 g/dL (ref 6.5–8.1)

## 2020-05-10 MED ORDER — SODIUM CHLORIDE 0.9% FLUSH
10.0000 mL | Freq: Once | INTRAVENOUS | Status: AC
  Administered 2020-05-10: 10 mL
  Filled 2020-05-10: qty 10

## 2020-05-10 MED ORDER — HEPARIN SOD (PORK) LOCK FLUSH 100 UNIT/ML IV SOLN
500.0000 [IU] | Freq: Once | INTRAVENOUS | Status: AC
  Administered 2020-05-10: 500 [IU]
  Filled 2020-05-10: qty 5

## 2020-05-10 NOTE — Assessment & Plan Note (Signed)
01/18/2020:Palpable left breast lump with calcifications at 5 o'clock position measuring 3.3 cm. Biopsy revealed grade 3 IDC ER 95%, PR 0%, HER-2 3+ positive, Ki-67 50%, axilla negative, T2N0 stage IIa clinical stage Bilateral implants removed 2005  Treatment plan: 1.Neo- adjuvant chemotherapy with Taxol Herceptin 2.Breast conserving surgery with sentinel lymph node biopsy 3.Adjuvant radiation therapy followed by 4.Adjuvant antiestrogen therapywith anastrozole 1 mg daily x5 years  Breast MRI: 2.3 cm mass, indeterminate 8 mm mass (Biopsy: ADH), no LN --------------------------------------------------------------------------------------------------------------------------------------------------------------- Current Treatment: Cycle12Abraxane-Herceptin will be done on9/07/2020 (Taxol discontinued because of rashsubstituted with Abraxane)  Chemo toxicities: Chemo induced anemia:Today's hemoglobin is9.4 Fatigue:Secondary to severe anemia   Fixed drug eruption:Due to Taxol.Taxol discontinued once starting Abraxane. Rash is better  Hematuria: Has seen urology.CT of the abdomen revealed right renal pelvis to have an area of suspicion for urothelial malignancy versus blood clot. She may need a retrograde urethroscopy.  Return to clinic after surgery to discuss the final pathology report.

## 2020-05-10 NOTE — Telephone Encounter (Signed)
Scheduled per 9/3 los. Pt will receive an updated appt calendar, per appt notes

## 2020-05-11 ENCOUNTER — Other Ambulatory Visit: Payer: Medicare HMO

## 2020-05-11 ENCOUNTER — Telehealth: Payer: Self-pay | Admitting: Hematology and Oncology

## 2020-05-11 ENCOUNTER — Encounter: Payer: Self-pay | Admitting: *Deleted

## 2020-05-11 ENCOUNTER — Ambulatory Visit: Payer: Medicare HMO

## 2020-05-11 ENCOUNTER — Ambulatory Visit: Payer: Medicare HMO | Admitting: Hematology and Oncology

## 2020-05-11 NOTE — Telephone Encounter (Signed)
No 9/8 los, no changes made to pt schedule

## 2020-05-12 NOTE — Progress Notes (Signed)
No Kanjinti on 9/11 per Dr. Lindi Adie - pt got 6 mg/kg on 05/05/20. Premeds adjusted accordingly.  Kennith Center, Pharm.D., CPP 05/12/2020@9 :33 AM

## 2020-05-13 ENCOUNTER — Other Ambulatory Visit: Payer: Self-pay

## 2020-05-13 ENCOUNTER — Inpatient Hospital Stay: Payer: Medicare HMO

## 2020-05-13 VITALS — BP 146/61 | HR 76 | Temp 97.7°F | Resp 16

## 2020-05-13 DIAGNOSIS — Z5111 Encounter for antineoplastic chemotherapy: Secondary | ICD-10-CM | POA: Diagnosis not present

## 2020-05-13 DIAGNOSIS — Z170 Estrogen receptor positive status [ER+]: Secondary | ICD-10-CM

## 2020-05-13 DIAGNOSIS — C50512 Malignant neoplasm of lower-outer quadrant of left female breast: Secondary | ICD-10-CM

## 2020-05-13 MED ORDER — PACLITAXEL PROTEIN-BOUND CHEMO INJECTION 100 MG
80.0000 mg/m2 | Freq: Once | INTRAVENOUS | Status: AC
  Administered 2020-05-13: 125 mg via INTRAVENOUS
  Filled 2020-05-13: qty 25

## 2020-05-13 MED ORDER — PROCHLORPERAZINE MALEATE 10 MG PO TABS
10.0000 mg | ORAL_TABLET | Freq: Once | ORAL | Status: AC
  Administered 2020-05-13: 10 mg via ORAL

## 2020-05-13 MED ORDER — HEPARIN SOD (PORK) LOCK FLUSH 100 UNIT/ML IV SOLN
500.0000 [IU] | Freq: Once | INTRAVENOUS | Status: AC | PRN
  Administered 2020-05-13: 500 [IU]
  Filled 2020-05-13: qty 5

## 2020-05-13 MED ORDER — SODIUM CHLORIDE 0.9% FLUSH
10.0000 mL | INTRAVENOUS | Status: DC | PRN
  Administered 2020-05-13: 10 mL
  Filled 2020-05-13: qty 10

## 2020-05-13 MED ORDER — SODIUM CHLORIDE 0.9 % IV SOLN
Freq: Once | INTRAVENOUS | Status: AC
  Filled 2020-05-13: qty 250

## 2020-05-13 MED ORDER — PROCHLORPERAZINE MALEATE 10 MG PO TABS
ORAL_TABLET | ORAL | Status: AC
  Filled 2020-05-13: qty 1

## 2020-05-13 NOTE — Patient Instructions (Signed)
[facility] Discharge Instructions for Patients Receiving Chemotherapy  Today you received the following chemotherapy agents: paclitaxel protein-bound.  To help prevent nausea and vomiting after your treatment, we encourage you to take your nausea medication as directed.   If you develop nausea and vomiting that is not controlled by your nausea medication, call the clinic.   BELOW ARE SYMPTOMS THAT SHOULD BE REPORTED IMMEDIATELY:  *FEVER GREATER THAN 100.5 F  *CHILLS WITH OR WITHOUT FEVER  NAUSEA AND VOMITING THAT IS NOT CONTROLLED WITH YOUR NAUSEA MEDICATION  *UNUSUAL SHORTNESS OF BREATH  *UNUSUAL BRUISING OR BLEEDING  TENDERNESS IN MOUTH AND THROAT WITH OR WITHOUT PRESENCE OF ULCERS  *URINARY PROBLEMS  *BOWEL PROBLEMS  UNUSUAL RASH Items with * indicate a potential emergency and should be followed up as soon as possible.  Feel free to call the clinic should you have any questions or concerns. The clinic phone number is [phone].  Please show the [facility] at check-in to the Emergency Department and triage nurse.

## 2020-05-16 ENCOUNTER — Ambulatory Visit (HOSPITAL_COMMUNITY)
Admission: RE | Admit: 2020-05-16 | Discharge: 2020-05-16 | Disposition: A | Discharge status: Home / Self Care | Payer: Medicare HMO | Source: Ambulatory Visit | Attending: Hematology and Oncology | Admitting: Hematology and Oncology

## 2020-05-16 ENCOUNTER — Other Ambulatory Visit: Payer: Self-pay

## 2020-05-16 DIAGNOSIS — Z951 Presence of aortocoronary bypass graft: Secondary | ICD-10-CM | POA: Insufficient documentation

## 2020-05-16 DIAGNOSIS — E785 Hyperlipidemia, unspecified: Secondary | ICD-10-CM | POA: Insufficient documentation

## 2020-05-16 DIAGNOSIS — I2510 Atherosclerotic heart disease of native coronary artery without angina pectoris: Secondary | ICD-10-CM | POA: Diagnosis not present

## 2020-05-16 DIAGNOSIS — Z170 Estrogen receptor positive status [ER+]: Secondary | ICD-10-CM | POA: Diagnosis not present

## 2020-05-16 DIAGNOSIS — I083 Combined rheumatic disorders of mitral, aortic and tricuspid valves: Secondary | ICD-10-CM | POA: Diagnosis not present

## 2020-05-16 DIAGNOSIS — C50512 Malignant neoplasm of lower-outer quadrant of left female breast: Secondary | ICD-10-CM | POA: Diagnosis not present

## 2020-05-16 DIAGNOSIS — I10 Essential (primary) hypertension: Secondary | ICD-10-CM | POA: Diagnosis not present

## 2020-05-16 LAB — ECHOCARDIOGRAM COMPLETE
AR max vel: 1.31 cm2
AV Area VTI: 1.22 cm2
AV Area mean vel: 1.17 cm2
AV Mean grad: 4 mmHg
AV Peak grad: 6.4 mmHg
Ao pk vel: 1.26 m/s
Area-P 1/2: 3.53 cm2
Calc EF: 60.1 %
MV M vel: 5.54 m/s
MV Peak grad: 122.8 mmHg
P 1/2 time: 491 msec
Radius: 0.3 cm
S' Lateral: 2.1 cm
Single Plane A2C EF: 62.8 %
Single Plane A4C EF: 60.6 %

## 2020-05-16 NOTE — Progress Notes (Signed)
  Echocardiogram 2D Echocardiogram has been performed.  Rachel Valentine 05/16/2020, 9:58 AM

## 2020-05-17 ENCOUNTER — Ambulatory Visit
Admission: RE | Admit: 2020-05-17 | Discharge: 2020-05-17 | Disposition: A | Discharge status: Home / Self Care | Payer: Medicare HMO | Source: Ambulatory Visit | Attending: Hematology and Oncology | Admitting: Hematology and Oncology

## 2020-05-17 DIAGNOSIS — Z170 Estrogen receptor positive status [ER+]: Secondary | ICD-10-CM

## 2020-05-17 DIAGNOSIS — N6489 Other specified disorders of breast: Secondary | ICD-10-CM | POA: Diagnosis not present

## 2020-05-17 DIAGNOSIS — C50512 Malignant neoplasm of lower-outer quadrant of left female breast: Secondary | ICD-10-CM

## 2020-05-17 IMAGING — MR MR BREAST BILAT WO/W CM
8 of 12 series · 32 of 48 positions shown · IV contrast (5 ml gadavist)
Comparison: [DATE] and earlier

CLINICAL DATA: Assess treatment response. Status post neoadjuvant
chemotherapy. Patient had ultrasound-guided core biopsy of a mass in
the 5 o'clock location of the LEFT breast [REDACTED], showing grade 3
invasive ductal carcinoma. Biopsy of a mass in the 6-630 o'clock
location of the LEFT breast showed concordant fibrocystic changes.
MR guided core biopsy of a mass in the LOWER INNER QUADRANT of the
LEFT breast, MEDIAL to the known malignancy showed atypical ductal
hyperplasia with excision recommended.

LABS:  None
EXAM:
BILATERAL BREAST MRI WITH AND WITHOUT CONTRAST
TECHNIQUE: Multiplanar, multisequence MR images of both breasts were obtained
prior to and following the intravenous administration of 5 ml of
Gadavist

[Series 2: t2_tirm_tra ipat (a-p) · axial · 3.0mm · 0.70mm/px · 1 of 60 slices shown]
[im 1/60]
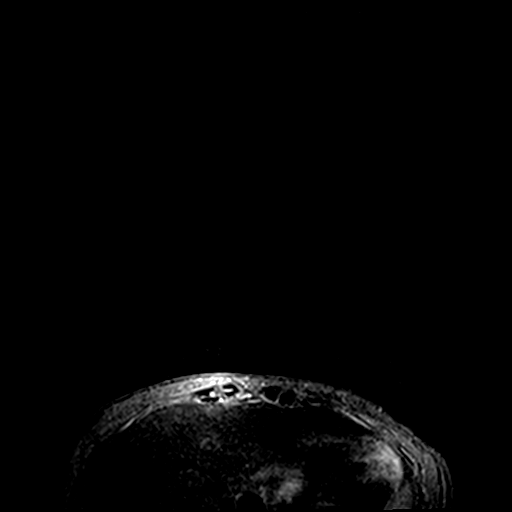

[Series 3: fl3d pre-cm no · axial · non-contrast · 1.2mm · 0.94mm/px · z∈[-50,+141]mm · 5 of 160 slices shown]
[im 1/160]
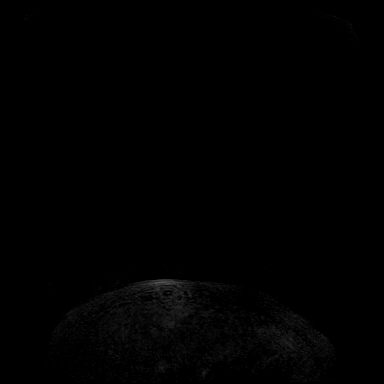
[im 40/160]
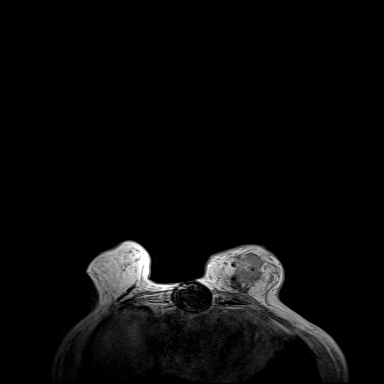
[im 80/160]
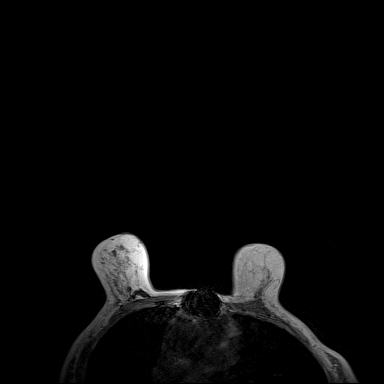
[im 120/160]
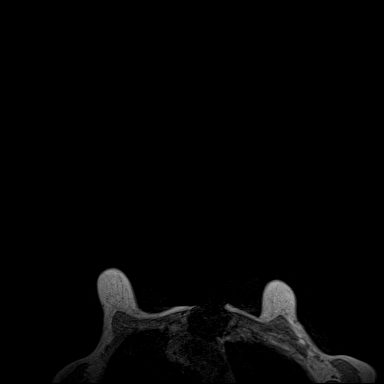
[im 160/160]
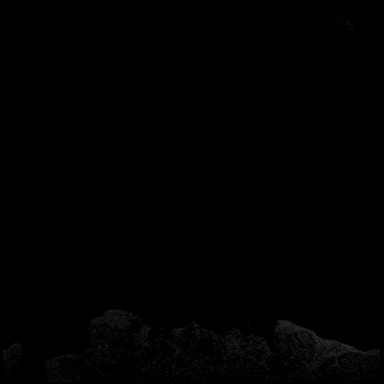

[Series 4: fl3d pre-cm · axial · non-contrast · 1.2mm · 0.94mm/px · z∈[-50,+141]mm · 5 of 160 slices shown]
[im 1/160]
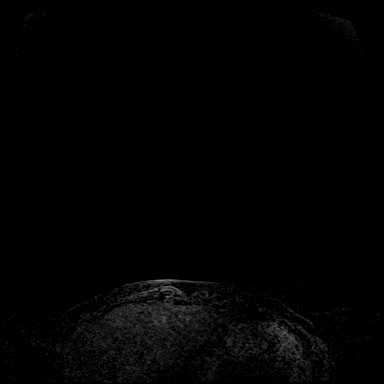
[im 40/160]
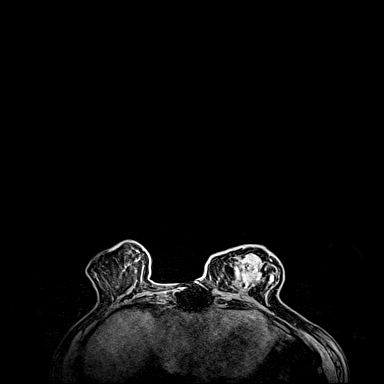
[im 80/160]
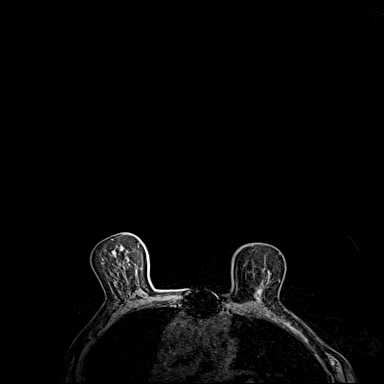
[im 120/160]
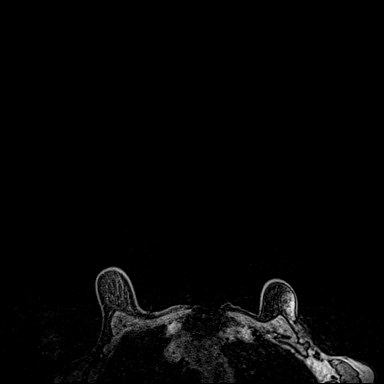
[im 160/160]
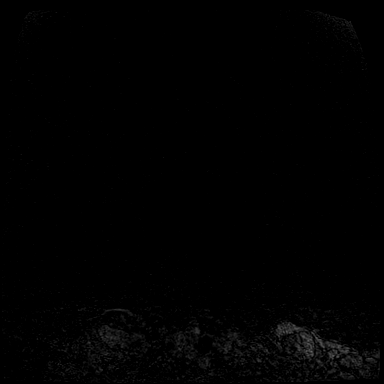

[Series 5: fl3d post immediate · axial · 1.2mm · 0.94mm/px · z∈[-50,+141]mm · 5 of 160 slices shown (1 of 3)]
[im 1/160]
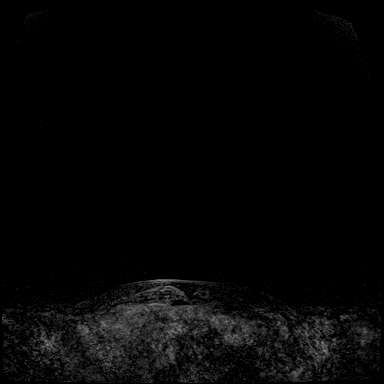
[im 40/160]
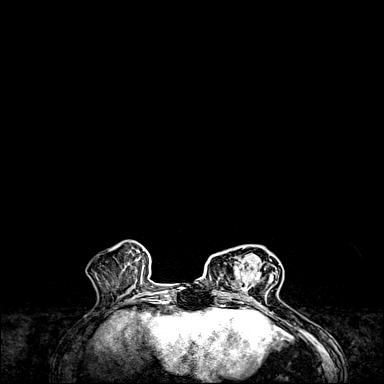
[im 80/160]
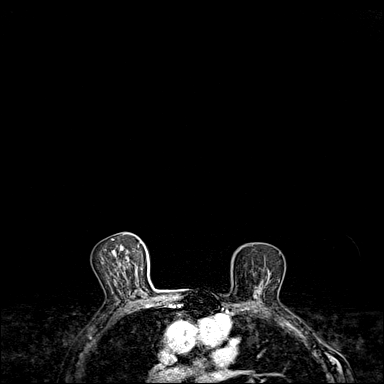
[im 120/160]
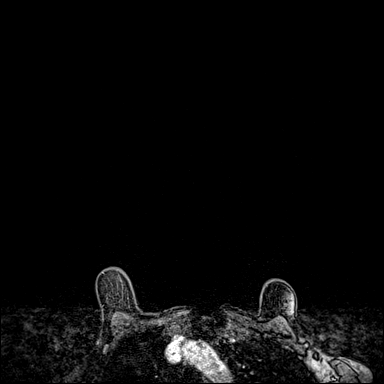
[im 160/160]
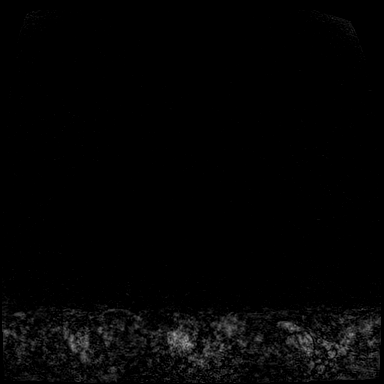

[Series 6: fl3d post immediate · axial · 1.2mm · 0.94mm/px · z∈[-50,+141]mm · 5 of 160 slices shown (2 of 3)]
[im 1/160]
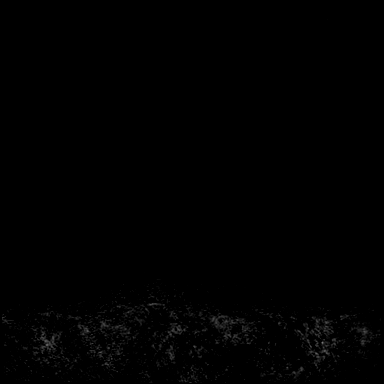
[im 40/160]
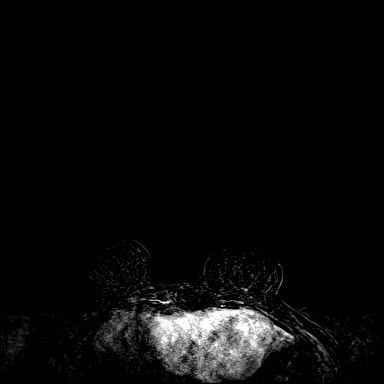
[im 80/160]
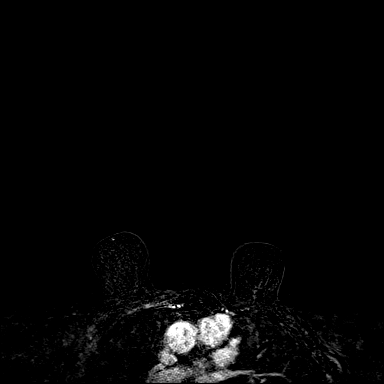
[im 120/160]
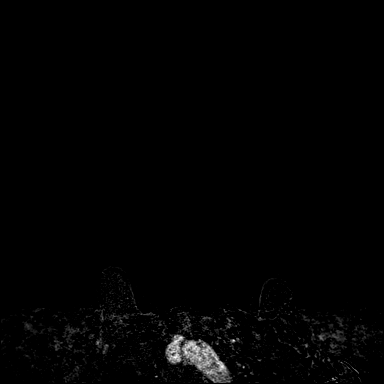
[im 160/160]
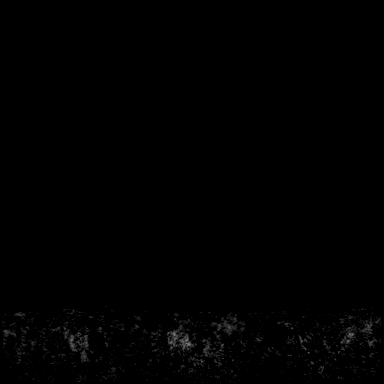

[Series 7: fl3d post immediate · axial · 192.0mm · 0.94mm/px · 1 of 1 slices shown (3 of 3)]
[im 1/1]
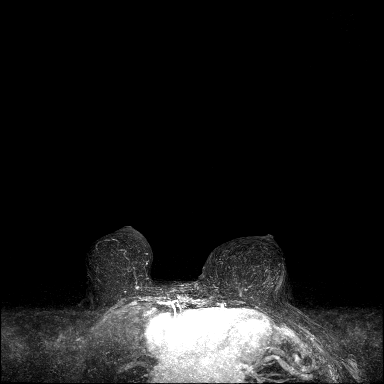

[Series 8: fl3d post 3min · axial · 1.2mm · 0.94mm/px · z∈[-50,+141]mm · 6 of 160 slices shown]
[im 1/160]
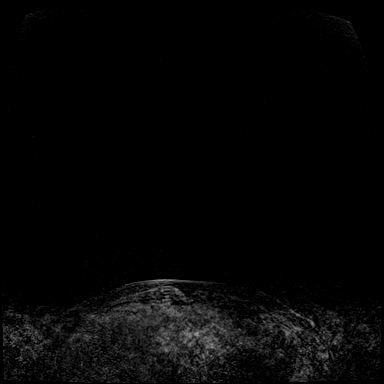
[im 32/160]
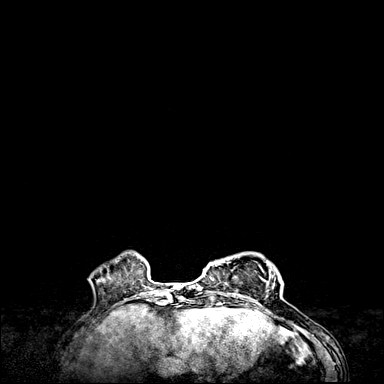
[im 64/160]
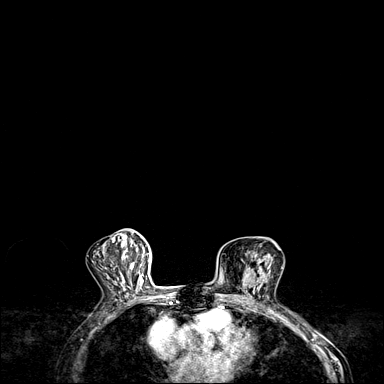
[im 96/160]
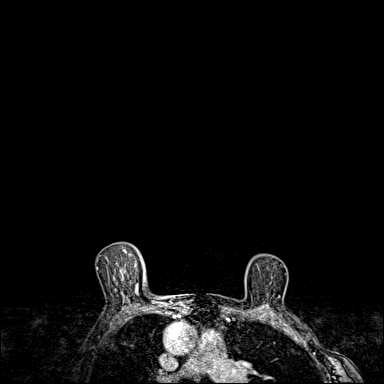
[im 128/160]
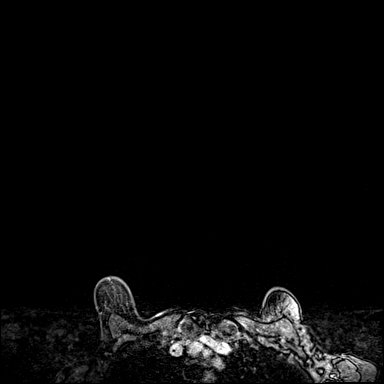
[im 160/160]
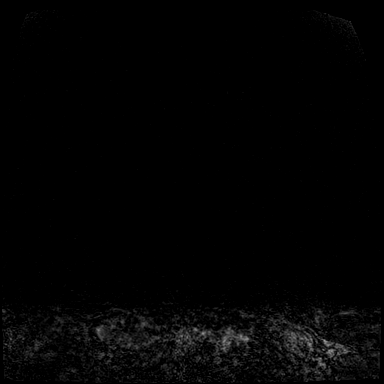

[Series 9: fl3d post 3min_sub · axial · 1.2mm · 0.94mm/px · z∈[-50,+64]mm · 4 of 160 slices shown]
[im 1/160]
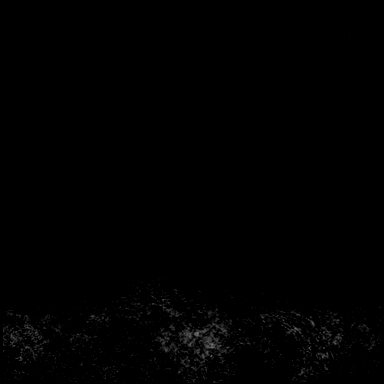
[im 32/160]
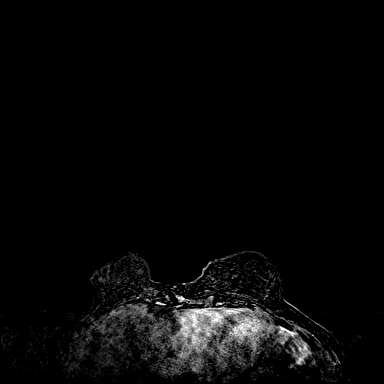
[im 64/160]
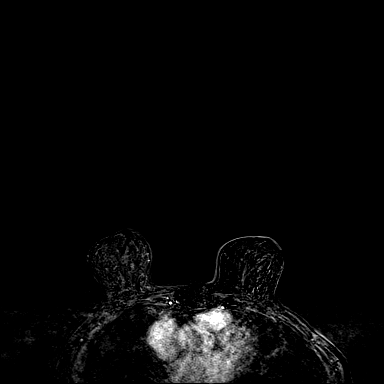
[im 96/160]
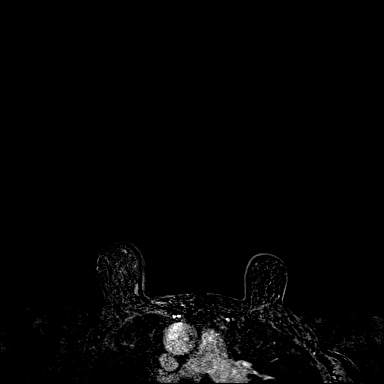

[32 of 48 positions shown; findings below may reference images not displayed]

Three-dimensional MR images were rendered by post-processing of the
original MR data on an independent workstation. The
three-dimensional MR images were interpreted, and findings are
reported in the following complete MRI report for this study. Three
dimensional images were evaluated at the independent interpreting
workstation using the DynaCAD thin client.
FINDINGS: Breast composition: c. Heterogeneous fibroglandular tissue.

Background parenchymal enhancement: Minimal

Right breast: No mass or abnormal enhancement. Stable, benign
intramammary lymph node in the LOWER INNER QUADRANT of the RIGHT
breast.

Left breast: There has been interval resolution of the abnormal
enhancement in the LEFT breast. No new or suspicious findings in the
LEFT breast. Tissue marker clips are identified in the central
aspect of the LEFT breast.

Lymph nodes: No abnormal appearing lymph nodes.

Ancillary findings:  Median sternotomy.
IMPRESSION: 1. Complete resolution of abnormal enhancement in the LEFT breast.
2. No new or suspicious findings.

RECOMMENDATION:
Treatment plan for known LEFT breast cancer.

BI-RADS CATEGORY  6: Known biopsy-proven malignancy.

## 2020-05-17 MED ORDER — GADOBUTROL 1 MMOL/ML IV SOLN
5.0000 mL | Freq: Once | INTRAVENOUS | Status: AC | PRN
  Administered 2020-05-17: 5 mL via INTRAVENOUS

## 2020-05-18 ENCOUNTER — Encounter: Payer: Self-pay | Admitting: *Deleted

## 2020-05-19 ENCOUNTER — Telehealth: Payer: Self-pay | Admitting: *Deleted

## 2020-05-19 NOTE — Telephone Encounter (Signed)
This RN spoke with pt per her call inquiring about participating in " yearly church yard sale " " the ladies at [employer] do it every year and I wasn't sure if I could participate ?"  This RN discussed above- with pt verifying she would be wearing a mask and eye glasses- if inside doing sorting she will maintain distance.  Pt stated appreciation of review of above.

## 2020-05-22 ENCOUNTER — Ambulatory Visit: Payer: Self-pay | Admitting: Surgery

## 2020-05-22 DIAGNOSIS — C50912 Malignant neoplasm of unspecified site of left female breast: Secondary | ICD-10-CM | POA: Diagnosis not present

## 2020-05-22 NOTE — H&P (Signed)
Rachel Valentine Appointment: 05/22/2020 11:30 AM Location: [facility] Patient #: [id number] DOB: 06-14-1941 Widowed / Language: Rachel Valentine / Race: White Female  History of Present Illness [name] A. Odesser Tourangeau MD; 05/22/2020 1:15 PM) Patient words: Patient returns for follow-up after undergoing neoadjuvant chemotherapy for left breast cancer stage II lower outer quadrant. She is on Herceptin and has had a complete response per magnetic resonance imaging. She has no complaints today and actually got chemotherapy treatments without significant complication.        CLINICAL DATA: Assess treatment response. Status post neoadjuvant chemotherapy. Patient had ultrasound-guided core biopsy of a mass in the 5 o'clock location of the LEFT breast in May, showing grade 3 invasive ductal carcinoma. Biopsy of a mass in the 6-630 o'clock location of the LEFT breast showed concordant fibrocystic changes. MR guided core biopsy of a mass in the LOWER INNER QUADRANT of the LEFT breast, MEDIAL to the known malignancy showed atypical ductal hyperplasia with excision recommended.  LABS: None  EXAM: BILATERAL BREAST MRI WITH AND WITHOUT CONTRAST  TECHNIQUE: Multiplanar, multisequence MR images of both breasts were obtained prior to and following the intravenous administration of 5 ml of Gadavist  Three-dimensional MR images were rendered by post-processing of the original MR data on an independent workstation. The three-dimensional MR images were interpreted, and findings are reported in the following complete MRI report for this study. Three dimensional images were evaluated at the independent interpreting workstation using the DynaCAD thin client.  COMPARISON: 03/10/2020 and earlier  FINDINGS: Breast composition: c. Heterogeneous fibroglandular tissue.  Background parenchymal enhancement: Minimal  Right breast: No mass or abnormal enhancement. Stable, benign intramammary lymph  node in the LOWER INNER QUADRANT of the RIGHT breast.  Left breast: There has been interval resolution of the abnormal enhancement in the LEFT breast. No new or suspicious findings in the LEFT breast. Tissue marker clips are identified in the central aspect of the LEFT breast.  Lymph nodes: No abnormal appearing lymph nodes.  Ancillary findings: Median sternotomy.  IMPRESSION: 1. Complete resolution of abnormal enhancement in the LEFT breast. 2. No new or suspicious findings.  RECOMMENDATION: Treatment plan for known LEFT breast cancer.  BI-RADS CATEGORY 6: Known biopsy-proven malignancy.   Electronically Signed By: Nolon Nations M.D. On: 05/18/2020 12:09.  The patient is a 79 year old female.   Allergies [name], [name]; 05/22/2020 11:42 AM) Clindamycin HCl *CHEMICALS* Rash. Fosamax *ENDOCRINE AND METABOLIC AGENTS - MISC.* Nausea. PACLitaxel *ANTINEOPLASTICS AND ADJUNCTIVE THERAPIES* Rash. Penicillins predniSONE *CORTICOSTEROIDS* Allergies Reconciled  Medication History [name], [name]; 05/22/2020 11:44 AM) Ondansetron HCl (8MG  Tablet, Oral) Active. Atorvastatin Calcium (80MG  Tablet, Oral) Active. Lidocaine-Prilocaine (2.5-2.5% Cream, External) Active. LORazepam (0.5MG  Tablet, Oral) Active. amLODIPine Besylate (2.5MG  Tablet, Oral) Active. Aspirin Low Dose (81MG  Tablet DR, Oral) Active. Rosuvastatin Calcium (40MG  Tablet, Oral) Active. Metoprolol Tartrate (25MG  Tablet, Oral) Active. Pantoprazole Sodium (40MG  Tablet DR, Oral) Active. Nitroglycerin (0.4MG  Tab Sublingual, Sublingual) Active. Citracal +D3 (Oral) Specific strength unknown - Active. Biotin (10000MCG Tablet, Oral) Active. Benadryl Allergy (25MG  Tablet, Oral) Active. Medications Reconciled    Vitals Lattie Haw Lykens RMA; 05/22/2020 11:45 AM) 05/22/2020 11:44 AM Weight: 102.38 lb Height: 62in Body Surface Area: 1.44 m Body Mass Index: 18.72 kg/m  Temp.: 97.51F   Pulse: 83 (Regular)  P.OX: 99% (Room air) BP: 118/64(Sitting, Left Arm, Standard)        Physical Exam (Georgio Hattabaugh A. Monica Codd MD; 05/22/2020 1:16 PM)  General Mental Status-Alert. General Appearance-Consistent with stated age. Hydration-Well hydrated. Voice-Normal.  Chest and  Lung Exam Chest and lung exam reveals -quiet, even and easy respiratory effort with no use of accessory muscles and on auscultation, normal breath sounds, no adventitious sounds and normal vocal resonance. Inspection Chest Wall - Normal. Back - normal.  Breast Breast - Left-Symmetric, Non Tender, No Biopsy scars, no Dimpling - Left, No Inflammation, No Lumpectomy scars, No Mastectomy scars, No Peau d' Orange. Breast - Right-Symmetric, Non Tender, No Biopsy scars, no Dimpling - Right, No Inflammation, No Lumpectomy scars, No Mastectomy scars, No Peau d' Orange. Breast Lump-No Palpable Breast Mass. Note: Bilateral breast reduction scars noted  Cardiovascular Cardiovascular examination reveals -normal heart sounds, regular rate and rhythm with no murmurs and normal pedal pulses bilaterally.  Lymphatic Head & Neck  General Head & Neck Lymphatics: Bilateral - Description - Normal. Axillary  General Axillary Region: Bilateral - Description - Normal. Tenderness - Non Tender.    Assessment & Plan (Dynver Clemson A. Halei Hanover MD; 05/22/2020 1:16 PM)  BREAST CANCER, LEFT (C50.912) Impression: complete response left breast seed lumpectomy left axillary sentinel lymph node mapping We'll also need the area of atypical ductal hyperplasia excised therefore she will need to seeds which I discussed with her today. Risk of lumpectomy include bleeding, infection, seroma, more surgery, use of seed/wire, wound care, cosmetic deformity and the need for other treatments, death , blood clots, death. Pt agrees to proceed. Risk of sentinel lymph node mapping include bleeding, infection, lymphedema, shoulder pain.  stiffness, dye allergy. cosmetic deformity , blood clots, death, need for more surgery. Pt agrees to proceed.  Current Plans Pt Education - [facility] Free Text Education/Instructions: discussed with patient and provided information. Pt Education - [facility] Breast Cancer Information Given - [facility] "Breast Journey" Package We discussed the staging and pathophysiology of breast cancer. We discussed all of the different options for treatment for breast cancer including surgery, chemotherapy, radiation therapy, Herceptin, and antiestrogen therapy. We discussed a sentinel lymph node biopsy as she does not appear to having lymph node involvement right now. We discussed the performance of that with injection of radioactive tracer and blue dye. We discussed that she would have an incision underneath her axillary hairline. We discussed that there is a bout a 10-20% chance of having a positive node with a sentinel lymph node biopsy and we will await the permanent pathology to make any other first further decisions in terms of her treatment. One of these options might be to return to the operating room to perform an axillary lymph node dissection. We discussed about a 1-2% risk lifetime of chronic shoulder pain as well as lymphedema associated with a sentinel lymph node biopsy. We discussed the options for treatment of the breast cancer which included lumpectomy versus a mastectomy. We discussed the performance of the lumpectomy with a wire placement. We discussed a 10-20% chance of a positive margin requiring reexcision in the operating room. We also discussed that she may need radiation therapy or antiestrogen therapy or both if she undergoes lumpectomy. We discussed the mastectomy and the postoperative care for that as well. We discussed that there is no difference in her survival whether she undergoes lumpectomy with radiation therapy or antiestrogen therapy versus a mastectomy. There is a slight difference in the local  recurrence rate being 3-5% with lumpectomy and about 1% with a mastectomy. We discussed the risks of operation including bleeding, infection, possible reoperation. She understands her further therapy will be based on what her stages at the time of her operation.  Pt Education - [facility]  breast cancer surgery: discussed with patient and provided information. Pt Education - [facility] (After Breast Cancer) Class Info: discussed with patient and provided information. Pt Education - [facility] Breast Pains Education

## 2020-05-22 NOTE — H&P (View-Only) (Signed)
Rachel Valentine Appointment: 05/22/2020 11:30 AM Location: [facility] Patient #: [id number] DOB: Apr 19, 1941 Widowed / Language: Rachel Valentine / Race: White Female  History of Present Illness [name] A. [name] MD; 05/22/2020 1:15 PM) Patient words: Patient returns for follow-up after undergoing neoadjuvant chemotherapy for left breast cancer stage II lower outer quadrant. She is on Herceptin and has had a complete response per magnetic resonance imaging. She has no complaints today and actually got chemotherapy treatments without significant complication.        CLINICAL DATA: Assess treatment response. Status post neoadjuvant chemotherapy. Patient had ultrasound-guided core biopsy of a mass in the 5 o'clock location of the LEFT breast in May, showing grade 3 invasive ductal carcinoma. Biopsy of a mass in the 6-630 o'clock location of the LEFT breast showed concordant fibrocystic changes. MR guided core biopsy of a mass in the LOWER INNER QUADRANT of the LEFT breast, MEDIAL to the known malignancy showed atypical ductal hyperplasia with excision recommended.  LABS: None  EXAM: BILATERAL BREAST MRI WITH AND WITHOUT CONTRAST  TECHNIQUE: Multiplanar, multisequence MR images of both breasts were obtained prior to and following the intravenous administration of 5 ml of Gadavist  Three-dimensional MR images were rendered by post-processing of the original MR data on an independent workstation. The three-dimensional MR images were interpreted, and findings are reported in the following complete MRI report for this study. Three dimensional images were evaluated at the independent interpreting workstation using the DynaCAD thin client.  COMPARISON: 03/10/2020 and earlier  FINDINGS: Breast composition: c. Heterogeneous fibroglandular tissue.  Background parenchymal enhancement: Minimal  Right breast: No mass or abnormal enhancement. Stable, benign intramammary lymph  node in the LOWER INNER QUADRANT of the RIGHT breast.  Left breast: There has been interval resolution of the abnormal enhancement in the LEFT breast. No new or suspicious findings in the LEFT breast. Tissue marker clips are identified in the central aspect of the LEFT breast.  Lymph nodes: No abnormal appearing lymph nodes.  Ancillary findings: Median sternotomy.  IMPRESSION: 1. Complete resolution of abnormal enhancement in the LEFT breast. 2. No new or suspicious findings.  RECOMMENDATION: Treatment plan for known LEFT breast cancer.  BI-RADS CATEGORY 6: Known biopsy-proven malignancy.   Electronically Signed By: Nolon Nations M.D. On: 05/18/2020 12:09.  The patient is a 79 year old female.   Allergies [name], [name]; 05/22/2020 11:42 AM) Clindamycin HCl *CHEMICALS* Rash. Fosamax *ENDOCRINE AND METABOLIC AGENTS - MISC.* Nausea. PACLitaxel *ANTINEOPLASTICS AND ADJUNCTIVE THERAPIES* Rash. Penicillins predniSONE *CORTICOSTEROIDS* Allergies Reconciled  Medication History [name], [name]; 05/22/2020 11:44 AM) Ondansetron HCl (8MG  Tablet, Oral) Active. Atorvastatin Calcium (80MG  Tablet, Oral) Active. Lidocaine-Prilocaine (2.5-2.5% Cream, External) Active. LORazepam (0.5MG  Tablet, Oral) Active. amLODIPine Besylate (2.5MG  Tablet, Oral) Active. Aspirin Low Dose (81MG  Tablet DR, Oral) Active. Rosuvastatin Calcium (40MG  Tablet, Oral) Active. Metoprolol Tartrate (25MG  Tablet, Oral) Active. Pantoprazole Sodium (40MG  Tablet DR, Oral) Active. Nitroglycerin (0.4MG  Tab Sublingual, Sublingual) Active. Citracal +D3 (Oral) Specific strength unknown - Active. Biotin (10000MCG Tablet, Oral) Active. Benadryl Allergy (25MG  Tablet, Oral) Active. Medications Reconciled    Vitals Rachel Valentine; 05/22/2020 11:45 AM) 05/22/2020 11:44 AM Weight: 102.38 lb Height: 62in Body Surface Area: 1.44 m Body Mass Index: 18.72 kg/m  Temp.: 97.73F   Pulse: 83 (Regular)  P.OX: 99% (Room air) BP: 118/64(Sitting, Left Arm, Standard)        Physical Exam ([name] MD; 05/22/2020 1:16 PM)  General Mental Status-Alert. General Appearance-Consistent with stated age. Hydration-Well hydrated. Voice-Normal.  Chest and  Lung Exam Chest and lung exam reveals -quiet, even and easy respiratory effort with no use of accessory muscles and on auscultation, normal breath sounds, no adventitious sounds and normal vocal resonance. Inspection Chest Wall - Normal. Back - normal.  Breast Breast - Left-Symmetric, Non Tender, No Biopsy scars, no Dimpling - Left, No Inflammation, No Lumpectomy scars, No Mastectomy scars, No Peau d' Orange. Breast - Right-Symmetric, Non Tender, No Biopsy scars, no Dimpling - Right, No Inflammation, No Lumpectomy scars, No Mastectomy scars, No Peau d' Orange. Breast Lump-No Palpable Breast Mass. Note: Bilateral breast reduction scars noted  Cardiovascular Cardiovascular examination reveals -normal heart sounds, regular rate and rhythm with no murmurs and normal pedal pulses bilaterally.  Lymphatic Head & Neck  General Head & Neck Lymphatics: Bilateral - Description - Normal. Axillary  General Axillary Region: Bilateral - Description - Normal. Tenderness - Non Tender.    Assessment & Plan ([name] MD; 05/22/2020 1:16 PM)  BREAST CANCER, LEFT (C50.912) Impression: complete response left breast seed lumpectomy left axillary sentinel lymph node mapping We'll also need the area of atypical ductal hyperplasia excised therefore she will need to seeds which I discussed with her today. Risk of lumpectomy include bleeding, infection, seroma, more surgery, use of seed/wire, wound care, cosmetic deformity and the need for other treatments, death , blood clots, death. Pt agrees to proceed. Risk of sentinel lymph node mapping include bleeding, infection, lymphedema, shoulder pain.  stiffness, dye allergy. cosmetic deformity , blood clots, death, need for more surgery. Pt agrees to proceed.  Current Plans Pt Education - [facility] Free Text Education/Instructions: discussed with patient and provided information. Pt Education - [facility] Breast Cancer Information Given - [employer] "Breast Journey" Package We discussed the staging and pathophysiology of breast cancer. We discussed all of the different options for treatment for breast cancer including surgery, chemotherapy, radiation therapy, Herceptin, and antiestrogen therapy. We discussed a sentinel lymph node biopsy as she does not appear to having lymph node involvement right now. We discussed the performance of that with injection of radioactive tracer and blue dye. We discussed that she would have an incision underneath her axillary hairline. We discussed that there is a bout a 10-20% chance of having a positive node with a sentinel lymph node biopsy and we will await the permanent pathology to make any other first further decisions in terms of her treatment. One of these options might be to return to the operating room to perform an axillary lymph node dissection. We discussed about a 1-2% risk lifetime of chronic shoulder pain as well as lymphedema associated with a sentinel lymph node biopsy. We discussed the options for treatment of the breast cancer which included lumpectomy versus a mastectomy. We discussed the performance of the lumpectomy with a wire placement. We discussed a 10-20% chance of a positive margin requiring reexcision in the operating room. We also discussed that she may need radiation therapy or antiestrogen therapy or both if she undergoes lumpectomy. We discussed the mastectomy and the postoperative care for that as well. We discussed that there is no difference in her survival whether she undergoes lumpectomy with radiation therapy or antiestrogen therapy versus a mastectomy. There is a slight difference in the local  recurrence rate being 3-5% with lumpectomy and about 1% with a mastectomy. We discussed the risks of operation including bleeding, infection, possible reoperation. She understands her further therapy will be based on what her stages at the time of her operation.  Pt Education - flb  breast cancer surgery: discussed with patient and provided information. Pt Education - ABC (After Breast Cancer) Class Info: discussed with patient and provided information. Pt Education - [facility] Breast Pains Education

## 2020-05-25 ENCOUNTER — Other Ambulatory Visit: Payer: Self-pay | Admitting: Hematology and Oncology

## 2020-05-25 ENCOUNTER — Encounter: Payer: Self-pay | Admitting: *Deleted

## 2020-05-25 DIAGNOSIS — C50512 Malignant neoplasm of lower-outer quadrant of left female breast: Secondary | ICD-10-CM

## 2020-05-25 DIAGNOSIS — Z170 Estrogen receptor positive status [ER+]: Secondary | ICD-10-CM

## 2020-05-29 ENCOUNTER — Other Ambulatory Visit: Payer: Self-pay | Admitting: Surgery

## 2020-05-29 ENCOUNTER — Encounter: Payer: Self-pay | Admitting: *Deleted

## 2020-05-29 DIAGNOSIS — C50912 Malignant neoplasm of unspecified site of left female breast: Secondary | ICD-10-CM

## 2020-05-30 DIAGNOSIS — M4316 Spondylolisthesis, lumbar region: Secondary | ICD-10-CM | POA: Diagnosis not present

## 2020-05-30 DIAGNOSIS — I700 Atherosclerosis of aorta: Secondary | ICD-10-CM | POA: Diagnosis not present

## 2020-05-30 DIAGNOSIS — R310 Gross hematuria: Secondary | ICD-10-CM | POA: Diagnosis not present

## 2020-05-30 DIAGNOSIS — K7689 Other specified diseases of liver: Secondary | ICD-10-CM | POA: Diagnosis not present

## 2020-05-31 ENCOUNTER — Inpatient Hospital Stay: Payer: Medicare HMO

## 2020-05-31 ENCOUNTER — Other Ambulatory Visit: Payer: Self-pay

## 2020-05-31 VITALS — BP 126/56 | HR 72 | Temp 98.2°F | Resp 18 | Wt 102.2 lb

## 2020-05-31 DIAGNOSIS — Z170 Estrogen receptor positive status [ER+]: Secondary | ICD-10-CM

## 2020-05-31 DIAGNOSIS — C50512 Malignant neoplasm of lower-outer quadrant of left female breast: Secondary | ICD-10-CM

## 2020-05-31 DIAGNOSIS — Z5111 Encounter for antineoplastic chemotherapy: Secondary | ICD-10-CM | POA: Diagnosis not present

## 2020-05-31 MED ORDER — ACETAMINOPHEN 325 MG PO TABS
650.0000 mg | ORAL_TABLET | Freq: Once | ORAL | Status: AC
  Administered 2020-05-31: 650 mg via ORAL

## 2020-05-31 MED ORDER — TRASTUZUMAB-ANNS CHEMO 150 MG IV SOLR
300.0000 mg | Freq: Once | INTRAVENOUS | Status: AC
  Administered 2020-05-31: 300 mg via INTRAVENOUS
  Filled 2020-05-31: qty 14.29

## 2020-05-31 MED ORDER — DIPHENHYDRAMINE HCL 25 MG PO CAPS
ORAL_CAPSULE | ORAL | Status: AC
  Filled 2020-05-31: qty 1

## 2020-05-31 MED ORDER — HEPARIN SOD (PORK) LOCK FLUSH 100 UNIT/ML IV SOLN
500.0000 [IU] | Freq: Once | INTRAVENOUS | Status: AC | PRN
  Administered 2020-05-31: 500 [IU]
  Filled 2020-05-31: qty 5

## 2020-05-31 MED ORDER — TRASTUZUMAB-ANNS CHEMO 150 MG IV SOLR: 6.0000 mg/kg | Freq: Once | INTRAVENOUS | Status: DC

## 2020-05-31 MED ORDER — SODIUM CHLORIDE 0.9 % IV SOLN
Freq: Once | INTRAVENOUS | Status: AC
  Filled 2020-05-31: qty 250

## 2020-05-31 MED ORDER — SODIUM CHLORIDE 0.9% FLUSH
10.0000 mL | INTRAVENOUS | Status: DC | PRN
  Administered 2020-05-31: 10 mL
  Filled 2020-05-31: qty 10

## 2020-05-31 MED ORDER — ACETAMINOPHEN 325 MG PO TABS
ORAL_TABLET | ORAL | Status: AC
  Filled 2020-05-31: qty 2

## 2020-05-31 MED ORDER — DIPHENHYDRAMINE HCL 25 MG PO CAPS
25.0000 mg | ORAL_CAPSULE | Freq: Once | ORAL | Status: AC
  Administered 2020-05-31: 25 mg via ORAL

## 2020-05-31 NOTE — Progress Notes (Signed)
Pt ok for Kanjinti per PA team.  Hardie Pulley, PharmD, BCPS, BCOP

## 2020-05-31 NOTE — Patient Instructions (Signed)
[facility] Discharge Instructions for Patients Receiving Chemotherapy  Today you received the following chemotherapy agents trastuzumab.  To help prevent nausea and vomiting after your treatment, we encourage you to take your nausea medication as directed.    If you develop nausea and vomiting that is not controlled by your nausea medication, call the clinic.   BELOW ARE SYMPTOMS THAT SHOULD BE REPORTED IMMEDIATELY:  *FEVER GREATER THAN 100.5 F  *CHILLS WITH OR WITHOUT FEVER  NAUSEA AND VOMITING THAT IS NOT CONTROLLED WITH YOUR NAUSEA MEDICATION  *UNUSUAL SHORTNESS OF BREATH  *UNUSUAL BRUISING OR BLEEDING  TENDERNESS IN MOUTH AND THROAT WITH OR WITHOUT PRESENCE OF ULCERS  *URINARY PROBLEMS  *BOWEL PROBLEMS  UNUSUAL RASH Items with * indicate a potential emergency and should be followed up as soon as possible.  Feel free to call the clinic should you have any questions or concerns. The clinic phone number is [phone].  Please show the CHEMO ALERT CARD at check-in to the Emergency Department and triage nurse.   

## 2020-06-02 ENCOUNTER — Ambulatory Visit: Payer: Medicare HMO

## 2020-06-05 DIAGNOSIS — N8110 Cystocele, unspecified: Secondary | ICD-10-CM | POA: Diagnosis not present

## 2020-06-05 DIAGNOSIS — E440 Moderate protein-calorie malnutrition: Secondary | ICD-10-CM | POA: Diagnosis not present

## 2020-06-08 ENCOUNTER — Encounter: Payer: Self-pay | Admitting: *Deleted

## 2020-06-08 DIAGNOSIS — C50512 Malignant neoplasm of lower-outer quadrant of left female breast: Secondary | ICD-10-CM

## 2020-06-08 DIAGNOSIS — Z170 Estrogen receptor positive status [ER+]: Secondary | ICD-10-CM

## 2020-06-08 NOTE — Pre-Procedure Instructions (Signed)
Rachel Valentine  06/08/2020    Your procedure is scheduled on Thursday, June 15, 2020 at 9:30 AM.   Report to [facility] Entrance "A" Admitting Office at 7:30 AM.   Call this number if you have problems the morning of surgery: [phone]   Questions prior to day of surgery, please call [phone] between 8 & 4 PM.   Remember:  Do not eat food after midnight Wednesday, 06/13/20.  You may drink clear liquids until 6:30 AM. Clear liquids allowed are: Water, Juice (non-citric and without pulp - diabetics please choose diet or no sugar options), Carbonated beverages - (diabetics please choose diet or no sugar options), Clear Tea, Black Coffee only (no creamer, milk or cream including half and half) and Gatorade (diabetics please choose diet or no sugar options)    Take these medicines the morning of surgery with A SIP OF WATER: Metoprolol (Toprol XL), Pantoprazole (Protonix), Rosuvastatin (Crestor), Nitroglycerin - if needed.  Stop Aspirin as instructed by physician/surgeon. Stop herbal medications (Biotin) and Vitamins as of today prior to surgery. Do not use other Aspirin containing products, NSAIDS (Ibuprofen, Aleve, etc) or Fish oil prior to surgery.    Do not wear jewelry, make-up or nail polish.  Do not wear lotions, powders, perfumes or deodorant.  Do not shave 48 hours prior to surgery.    Do not bring valuables to the hospital.  [facility] is not responsible for any belongings or valuables.  Contacts, dentures or bridgework may not be worn into surgery.  Leave your suitcase in the car.  After surgery it may be brought to your room.  For patients admitted to the hospital, discharge time will be determined by your treatment team.  Patients discharged the day of surgery will not be allowed to drive home.   [place] - Preparing for Surgery  Before surgery, you can play an important role.  Because skin is not sterile, your skin needs to be as free of  germs as possible.  You can reduce the number of germs on you skin by washing with CHG (chlorahexidine gluconate) soap before surgery.  CHG is an antiseptic cleaner which kills germs and bonds with the skin to continue killing germs even after washing.  Oral Hygiene is also important in reducing the risk of infection.  Remember to brush your teeth with your regular toothpaste the morning of surgery.  Please DO NOT use if you have an allergy to CHG or antibacterial soaps.  If your skin becomes reddened/irritated stop using the CHG and inform your nurse when you arrive at Short Stay.  Do not shave (including legs and underarms) for at least 48 hours prior to the first CHG shower.  You may shave your face.  Please follow these instructions carefully:   1.  Shower with CHG Soap the night before surgery and the morning of Surgery.  2.  If you choose to wash your hair, wash your hair first as usual with your normal shampoo.  3.  After you shampoo, rinse your hair and body thoroughly to remove the shampoo. 4.  Use CHG as you would any other liquid soap.  You can apply chg directly to the skin and wash gently with a      scrungie or washcloth.           5.  Apply the CHG Soap to your body ONLY FROM THE NECK DOWN.   Do not use on open wounds or open sores. Avoid contact  with your eyes, ears, mouth and genitals (private parts).  Wash genitals (private parts) with your normal soap - do this prior to using the CHG soap.  6.  Wash thoroughly, paying special attention to the area where your surgery will be performed.  7.  Thoroughly rinse your body with warm water from the neck down.  8.  DO NOT shower/wash with your normal soap after using and rinsing off the CHG Soap.  9.  Pat yourself dry with a clean towel.            10.  Wear clean pajamas.            11.  Place clean sheets on your bed the night of your first shower and do not sleep with pets.  Day of Surgery  Shower as above. Do not apply any  lotions/deodorants the morning of surgery.   Please wear clean clothes to the hospital. Remember to brush your teeth with toothpaste.  Please read over the fact sheets that you were given.

## 2020-06-09 ENCOUNTER — Other Ambulatory Visit: Payer: Self-pay

## 2020-06-09 ENCOUNTER — Encounter (HOSPITAL_COMMUNITY)
Admission: RE | Admit: 2020-06-09 | Discharge: 2020-06-09 | Disposition: A | Discharge status: Home / Self Care | Payer: Medicare HMO | Source: Ambulatory Visit | Attending: Surgery | Admitting: Surgery

## 2020-06-09 ENCOUNTER — Encounter (HOSPITAL_COMMUNITY): Payer: Self-pay

## 2020-06-09 DIAGNOSIS — Z79899 Other long term (current) drug therapy: Secondary | ICD-10-CM | POA: Diagnosis not present

## 2020-06-09 DIAGNOSIS — I129 Hypertensive chronic kidney disease with stage 1 through stage 4 chronic kidney disease, or unspecified chronic kidney disease: Secondary | ICD-10-CM | POA: Diagnosis not present

## 2020-06-09 DIAGNOSIS — C50912 Malignant neoplasm of unspecified site of left female breast: Secondary | ICD-10-CM

## 2020-06-09 DIAGNOSIS — Z87891 Personal history of nicotine dependence: Secondary | ICD-10-CM | POA: Insufficient documentation

## 2020-06-09 DIAGNOSIS — N189 Chronic kidney disease, unspecified: Secondary | ICD-10-CM | POA: Insufficient documentation

## 2020-06-09 DIAGNOSIS — Z7901 Long term (current) use of anticoagulants: Secondary | ICD-10-CM | POA: Diagnosis not present

## 2020-06-09 DIAGNOSIS — K219 Gastro-esophageal reflux disease without esophagitis: Secondary | ICD-10-CM | POA: Diagnosis not present

## 2020-06-09 HISTORY — DX: Anemia, unspecified: D64.9

## 2020-06-09 HISTORY — DX: Chronic kidney disease, unspecified: N18.9

## 2020-06-09 HISTORY — DX: Gastro-esophageal reflux disease without esophagitis: K21.9

## 2020-06-09 HISTORY — DX: Atherosclerotic heart disease of native coronary artery without angina pectoris: I25.10

## 2020-06-09 LAB — COMPREHENSIVE METABOLIC PANEL
ALT: 10 U/L (ref 0–44)
AST: 16 U/L (ref 15–41)
Albumin: 3.9 g/dL (ref 3.5–5.0)
Alkaline Phosphatase: 79 U/L (ref 38–126)
Anion gap: 10 (ref 5–15)
BUN: 16 mg/dL (ref 8–23)
CO2: 25 mmol/L (ref 22–32)
Calcium: 9.7 mg/dL (ref 8.9–10.3)
Chloride: 104 mmol/L (ref 98–111)
Creatinine, Ser: 0.7 mg/dL (ref 0.44–1.00)
GFR calc non Af Amer: >60 mL/min (ref >60)
Glucose, Bld: 96 mg/dL (ref 70–99)
Potassium: 4.4 mmol/L (ref 3.5–5.1)
Sodium: 139 mmol/L (ref 135–145)
Total Bilirubin: 0.2 mg/dL — ABNORMAL LOW (ref 0.3–1.2)
Total Protein: 6.8 g/dL (ref 6.5–8.1)

## 2020-06-09 LAB — CBC WITH DIFFERENTIAL/PLATELET
Abs Immature Granulocytes: 0.03 10*3/uL (ref 0.00–0.07)
Basophils Absolute: 0.1 10*3/uL (ref 0.0–0.1)
Basophils Relative: 1 %
Eosinophils Absolute: 1.1 10*3/uL — ABNORMAL HIGH (ref 0.0–0.5)
Eosinophils Relative: 13 %
HCT: 35.5 % — ABNORMAL LOW (ref 36.0–46.0)
Hemoglobin: 10.5 g/dL — ABNORMAL LOW (ref 12.0–15.0)
Immature Granulocytes: 0 %
Lymphocytes Relative: 17 %
Lymphs Abs: 1.5 10*3/uL (ref 0.7–4.0)
MCH: 27.6 pg (ref 26.0–34.0)
MCHC: 29.6 g/dL — ABNORMAL LOW (ref 30.0–36.0)
MCV: 93.4 fL (ref 80.0–100.0)
Monocytes Absolute: 0.6 10*3/uL (ref 0.1–1.0)
Monocytes Relative: 7 %
Neutro Abs: 5.5 10*3/uL (ref 1.7–7.7)
Neutrophils Relative %: 62 %
Platelets: 342 10*3/uL (ref 150–400)
RBC: 3.8 MIL/uL — ABNORMAL LOW (ref 3.87–5.11)
RDW: 16.5 % — ABNORMAL HIGH (ref 11.5–15.5)
WBC: 8.7 10*3/uL (ref 4.0–10.5)
nRBC: 0 % (ref 0.0–0.2)

## 2020-06-09 NOTE — Progress Notes (Signed)
PCP - Dr. Sela Hilding Cardiologist - Dr. Gwenlyn Found  Chest x-ray - 02/15/20 (1 view) EKG - 04/18/20 ECHO - 05/16/20 Cardiac Cath - 06/24/19  ERAS Protcol - yes, no drink ordered   COVID TEST- scheduled for 06/12/20  Anesthesia review: yes, heart hx  Patient denies shortness of breath, fever, cough and chest pain at PAT appointment   All instructions explained to the patient, with a verbal understanding of the material. Patient agrees to go over the instructions while at home for a better understanding. Patient also instructed to self quarantine after being tested for COVID-19. The opportunity to ask questions was provided.

## 2020-06-12 ENCOUNTER — Other Ambulatory Visit (HOSPITAL_COMMUNITY)
Admission: RE | Admit: 2020-06-12 | Discharge: 2020-06-12 | Disposition: A | Discharge status: Home / Self Care | Payer: Medicare HMO | Source: Ambulatory Visit | Attending: Surgery | Admitting: Surgery

## 2020-06-12 DIAGNOSIS — Z20822 Contact with and (suspected) exposure to covid-19: Secondary | ICD-10-CM | POA: Diagnosis not present

## 2020-06-12 DIAGNOSIS — Z01812 Encounter for preprocedural laboratory examination: Secondary | ICD-10-CM | POA: Insufficient documentation

## 2020-06-12 LAB — SARS CORONAVIRUS 2 (TAT 6-24 HRS): SARS Coronavirus 2: NEGATIVE

## 2020-06-12 NOTE — Progress Notes (Signed)
Anesthesia Chart Review:  Case: [id number] Date/Time: 06/15/20 0915   Procedure: LEFT BREAST LUMPECTOMYX 2 WITH RADIOACTIVE SEED AND SENTINEL LYMPH NODE BIOPSY (Left Breast) - PECTORAL BLOCK   Anesthesia type: General   Pre-op diagnosis: LEFT BREAST CANCER   Location: [facility] OR ROOM 02 / [facility]   Surgeons: [name], MD      DISCUSSION: Patient is a 79 year old female scheduled for the above procedure.  History includes former smoker (quit 01/06/99), post-operative N/V, HTN, CAD (s/p DES mid CX and PTCA distal CX 01/04/19 with staged orbital atherectomy/PTCA/DES x2 mid and distal RCA 01/05/19; unsuccessful RCA PCI for symptomatic in-stent restenosis 06/24/19; s/p CABG x1: SVG-PDA 06/28/19), GERD, left breast cancer (diagnosed 01/2020; s/p right IJ Port-a-cath 02/15/20, chemotherapy started 02/16/20), chemo-related anemia, bowel obstruction, breast implants removed (2005).    Last cardiology evaluation with Dr. Gwenlyn Found on 04/18/2020. She was doing well from a cardiac standpoint. He did discontinue her Plavix because she had developed gross hematuria and was > 12 months out from 01/2019 DES to CX/RCA.    Last visit with Dr. Lindi Adie 05/10/20. He notes she may need a retrograde urethroscopy to further evaluate right renal pelvis defect on recent CT for hematuria (followed at [facility]). She received trastuzumab on 05/31/20 and paclitaxel 05/13/20. HGB 10.5, up from 9.4. He is planning to see her again after surgery.   06/12/20 presurgical COVID-19 test is in process. RSL 06/13/20. Anesthesia team to evaluate on the day of surgery.    VS: BP (!) 146/68   Pulse 79   Temp 36.6 C (Oral)   Resp 18   Ht 5\' 2"  (1.575 m)   Wt 46.6 kg   SpO2 100%   BMI 18.80 kg/m    PROVIDERS: [name], MD is PCP Quay Burow, MD is cardiologist Gilford Raid, MD is CT surgeon Nicholas Lose, MD is Baker Pierini, MD is RAD-ONC Irine Seal, MD is urologist. Also has note by Arnette Schaumann, MD (Media  tab).    LABS: Labs reviewed: Acceptable for surgery. (all labs ordered are listed, but only abnormal results are displayed)  Labs Reviewed  CBC WITH DIFFERENTIAL/PLATELET - Abnormal; Notable for the following components:      Result Value   RBC 3.80 (*)    Hemoglobin 10.5 (*)    HCT 35.5 (*)    MCHC 29.6 (*)    RDW 16.5 (*)    Eosinophils Absolute 1.1 (*)    All other components within normal limits  COMPREHENSIVE METABOLIC PANEL - Abnormal; Notable for the following components:   Total Bilirubin 0.2 (*)    All other components within normal limits    Spirometry 06/25/19: FVC 2.57 (103%), FEV1 1.76 (94%), FEV1R 68% (91%).    IMAGES: 1V PCXR (post-Port placement) 02/15/20: FINDINGS: Interval placement of right internal jugular approach Port-A-Cath with distal tip terminating at the level of the superior cavoatrial junction. Post CABG changes. Normal heart size. Atherosclerotic calcification of the aortic knob. Streaky left basilar opacity. No pleural effusion or pneumothorax. IMPRESSION: 1. Interval placement of right internal jugular approach Port-A-Cath with distal tip terminating at the level of the superior cavoatrial junction. No pneumothorax. 2. Streaky left basilar opacity which may reflect atelectasis versus infiltrate.  CT abd/pelvis 04/17/20 (ordered by Dr. Jeffie Pollock): Impression: Mild to moderate right renal pelvicaliectasis, with nodular filling defect within the right renal pelvis and upper pole collecting system.  This is suspicious for urothelial neoplasm, although differential diagnosis also includes blood clot.  Consider  further evaluation with retrograde ureteroscopy.  No evidence of metastatic disease.  Aortic atherosclerosis.   EKG: 04/18/20: NSR, possible left atrial enlargement.   CV: Echo 05/16/20: IMPRESSIONS  1. GLS-25%.  2. Left ventricular ejection fraction, by estimation, is 55 to 60%. The  left ventricle has normal function. The left  ventricle has no regional  wall motion abnormalities. Left ventricular diastolic parameters are  indeterminate.  3. Right ventricular systolic function is normal. The right ventricular  size is normal. There is normal pulmonary artery systolic pressure.  4. The mitral valve is normal in structure. Mild mitral valve  regurgitation. No evidence of mitral stenosis.  5. Tricuspid valve regurgitation is mild to moderate.  6. The aortic valve is tricuspid. Aortic valve regurgitation is mild.  Mild to moderate aortic valve sclerosis/calcification is present, without  any evidence of aortic stenosis.  7. The inferior vena cava is normal in size with greater than 50%  respiratory variability, suggesting right atrial pressure of 3 mmHg.   Carotid US 06/25/19: Summary:  - Right Carotid: Velocities in the right ICA are consistent with a 1-39%  stenosis.  - Left Carotid: Velocities in the left ICA are consistent with a 1-39%  stenosis.  - Vertebrals: Bilateral vertebral arteries demonstrate antegrade flow.   Cardiac cath (pre-CABG 06/24/19):  Prox RCA lesion is 99% stenosed.  Prox RCA to Mid RCA lesion is 50% stenosed.  Ost RCA to Prox RCA lesion is 70% stenosed.  Previously placed Prox Cx to Mid Cx stent (unknown type) is widely patent.  Ost Cx to Prox Cx lesion is 40% stenosed.  Mid Cx to Dist Cx lesion is 40% stenosed.  Ost LM to Mid LM lesion is 40% stenosed. IMPRESSION: Unsuccessful attempt at RCA PCI and reintervention for aggressive in-stent restenosis in the setting of accelerated angina...TCTS to evaluate for CABG - S/p CABG x1: SVG-PDA 06/28/19.   Past Medical History:  Diagnosis Date  . Anemia    during chemo  . Bowel obstruction ([place])   . Cancer [facility])    dx of breast cancer 2021 - per pt in PAT 02/09/20  . Chronic kidney disease    blood clot in right kidney after surgery in June, 2021  . Coronary artery disease   . GERD (gastroesophageal reflux disease)   .  Hypertension   . Pneumonia   . PONV (postoperative nausea and vomiting)     Past Surgical History:  Procedure Laterality Date  . ABDOMINAL HYSTERECTOMY    . ABDOMINAL SURGERY    . APPENDECTOMY    . AUGMENTATION MAMMAPLASTY Bilateral    implants removed in 2005  . BREAST EXCISIONAL BIOPSY Left    benign  . CORONARY ARTERY BYPASS GRAFT N/A 06/28/2019   Procedure: CORONARY ARTERY BYPASS GRAFTING (CABG) using endoscopic harvest right saphenous vein to the posterior lateral (PLB).;  Surgeon: Gaye Pollack, MD;  Location: [facility] OR;  Service: Open Heart Surgery;  Laterality: N/A;  . CORONARY ATHERECTOMY N/A 01/05/2019   Procedure: CORONARY ATHERECTOMY - STENT;  Surgeon: Burnell Blanks, MD;  Location: [facility] CV LAB;  Service: Cardiovascular;  Laterality: N/A;  . CORONARY STENT INTERVENTION N/A 01/04/2019   Procedure: CORONARY STENT INTERVENTION;  Surgeon: Leonie Man, MD;  Location: [facility] CV LAB;  Service: Cardiovascular;  Laterality: N/A;  . LEFT HEART CATH AND CORONARY ANGIOGRAPHY N/A 01/04/2019   Procedure: LEFT HEART CATH AND CORONARY ANGIOGRAPHY;  Surgeon: Leonie Man, MD;  Location: [facility] CV LAB;  Service:  Cardiovascular;  Laterality: N/A;  . LEFT HEART CATH AND CORONARY ANGIOGRAPHY N/A 06/24/2019   Procedure: LEFT HEART CATH AND CORONARY ANGIOGRAPHY;  Surgeon: [name], MD;  Location: [place] CV LAB;  Service: Cardiovascular;  Laterality: N/A;  . PORTACATH PLACEMENT Right 02/15/2020   Procedure: INSERTION PORT-A-CATH WITH ULTRASOUND GUIDANCE;  Surgeon: [name], MD;  Location: [facility];  Service: General;  Laterality: Right;  . TEE WITHOUT CARDIOVERSION N/A 06/28/2019   Procedure: TRANSESOPHAGEAL ECHOCARDIOGRAM (TEE);  Surgeon: Gaye Pollack, MD;  Location: [facility];  Service: Open Heart Surgery;  Laterality: N/A;  . TEMPORARY PACEMAKER N/A 01/05/2019   Procedure: TEMPORARY PACEMAKER;  Surgeon: Burnell Blanks, MD;  Location: [place]  CV LAB;  Service: Cardiovascular;  Laterality: N/A;  . TONSILLECTOMY      MEDICATIONS: . prochlorperazine (COMPAZINE) 10 MG tablet  . aspirin EC 81 MG tablet  . Biotin 10000 MCG TABS  . calcium citrate (CALCITRATE - DOSED IN MG ELEMENTAL CALCIUM) 950 (200 Ca) MG tablet  . Calcium-Phosphorus-Vitamin D (CITRACAL +D3 PO)  . diphenhydrAMINE (BENADRYL) 25 mg capsule  . ipratropium (ATROVENT) 0.03 % nasal spray  . metoprolol succinate (TOPROL-XL) 25 MG 24 hr tablet  . nitroGLYCERIN (NITROSTAT) 0.4 MG SL tablet  . pantoprazole (PROTONIX) 40 MG tablet  . Propylene Glycol (SYSTANE BALANCE) 0.6 % SOLN  . rosuvastatin (CRESTOR) 40 MG tablet   No current facility-administered medications for this encounter.    Myra Gianotti, PA-C Surgical Short Stay/Anesthesiology [facility] Phone [phone] [facility] Phone [phone] 06/12/2020 11:40 AM

## 2020-06-12 NOTE — Anesthesia Preprocedure Evaluation (Addendum)
Anesthesia Evaluation  Patient identified by MRN, date of birth, ID band Patient awake    Reviewed: Allergy & Precautions, NPO status , Patient's Chart, lab work & pertinent test results  Airway Mallampati: II  TM Distance: >3 FB Neck ROM: Full    Dental  (+) Teeth Intact, Dental Advisory Given   Pulmonary former smoker,    breath sounds clear to auscultation       Cardiovascular hypertension,  Rhythm:Regular Rate:Normal     Neuro/Psych    GI/Hepatic   Endo/Other    Renal/GU      Musculoskeletal   Abdominal   Peds  Hematology   Anesthesia Other Findings   Reproductive/Obstetrics                            Anesthesia Physical Anesthesia Plan  ASA: III  Anesthesia Plan: General   Post-op Pain Management:  Regional for Post-op pain   Induction: Intravenous, Cricoid pressure planned and Rapid sequence  PONV Risk Score and Plan: Ondansetron and Dexamethasone  Airway Management Planned: Oral ETT  Additional Equipment:   Intra-op Plan:   Post-operative Plan: Extubation in OR  Informed Consent: I have reviewed the patients History and Physical, chart, labs and discussed the procedure including the risks, benefits and alternatives for the proposed anesthesia with the patient or authorized representative who has indicated his/her understanding and acceptance.     Dental advisory given  Plan Discussed with: Anesthesiologist  Anesthesia Plan Comments: (PAT note written 06/12/2020 by Myra Gianotti, PA-C.  Plan GA with oral ETT and pectoralis block. Patient nauseated at present. Plan rapid sequence induction)       Anesthesia Quick Evaluation

## 2020-06-13 ENCOUNTER — Ambulatory Visit
Admission: RE | Admit: 2020-06-13 | Discharge: 2020-06-13 | Disposition: A | Discharge status: Home / Self Care | Payer: Medicare HMO | Source: Ambulatory Visit | Attending: Surgery | Admitting: Surgery

## 2020-06-13 ENCOUNTER — Other Ambulatory Visit: Payer: Self-pay

## 2020-06-13 DIAGNOSIS — C50512 Malignant neoplasm of lower-outer quadrant of left female breast: Secondary | ICD-10-CM | POA: Diagnosis not present

## 2020-06-13 DIAGNOSIS — C50912 Malignant neoplasm of unspecified site of left female breast: Secondary | ICD-10-CM

## 2020-06-13 DIAGNOSIS — N6092 Unspecified benign mammary dysplasia of left breast: Secondary | ICD-10-CM | POA: Diagnosis not present

## 2020-06-13 IMAGING — MG MM PLC BREAST LOC DEV 1ST LESION INC MAMMO GUIDE*L*
8 of 10 series · 8 of 10 positions shown · non-contrast
Comparison: Previous exam(s).

CLINICAL DATA: 79-year-old who had a biopsy-proven grade 3 invasive
ductal carcinoma involving the LOWER OUTER QUADRANT of the LEFT
breast in [DATE] (ribbon clip), for which the patient underwent
neoadjuvant chemotherapy with a complete imaging response to
treatment. She also had an MRI biopsy in [DATE], with pathology
atypical ductal hyperplasia (the barbell shaped tissue marker clip
migrated medially relative to the biopsy site). She presents today
for radioactive seed localization of both sites prior to lumpectomy
which is scheduled in 2 days.

EXAM:
MAMMOGRAPHIC GUIDED RADIOACTIVE SEED LOCALIZATION OF THE LEFT BREAST
x 2

[L CC (1 of 4)]
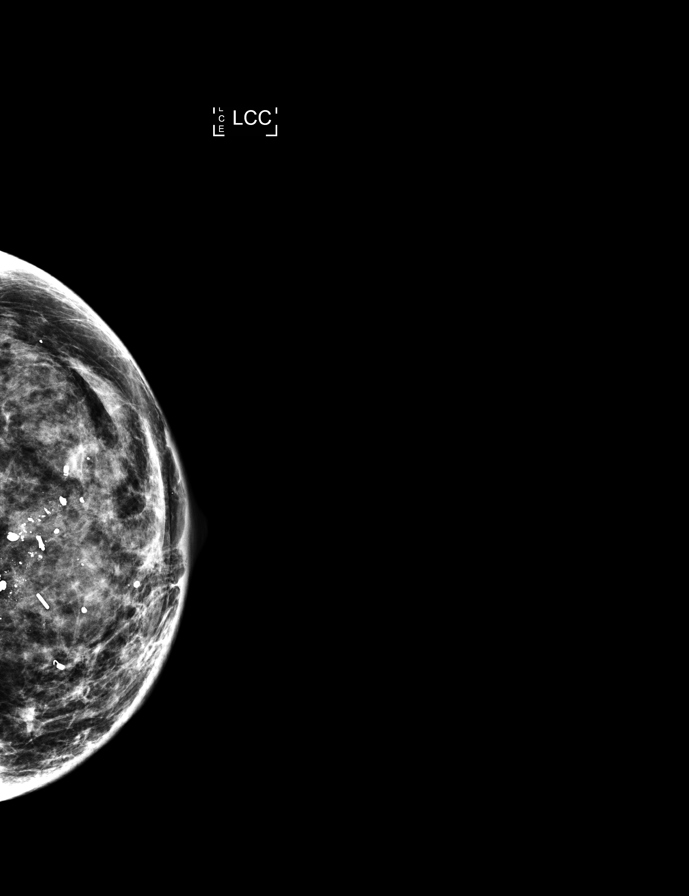

[L CC (2 of 4)]
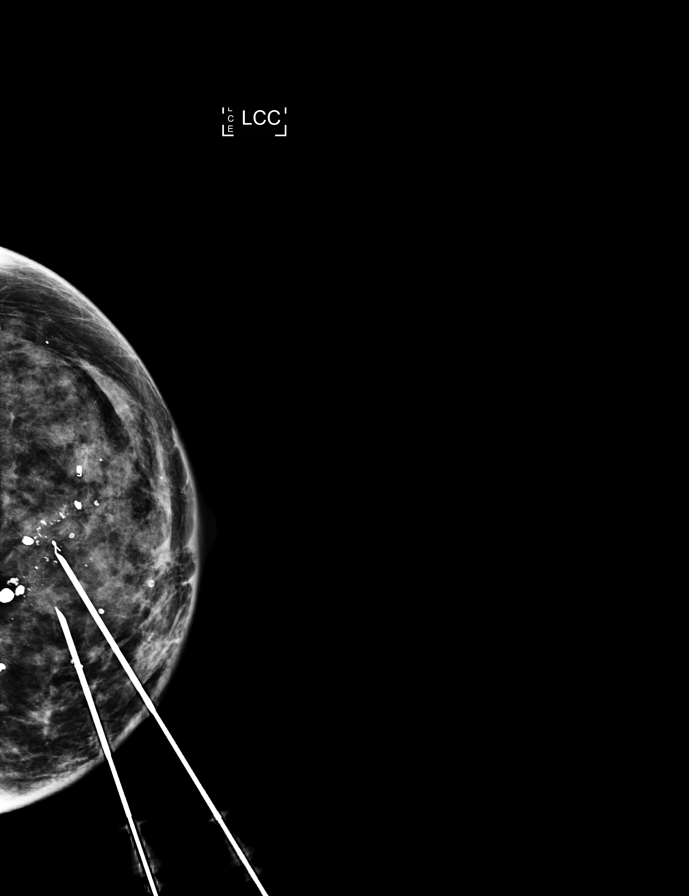

[L ML (1 of 4)]
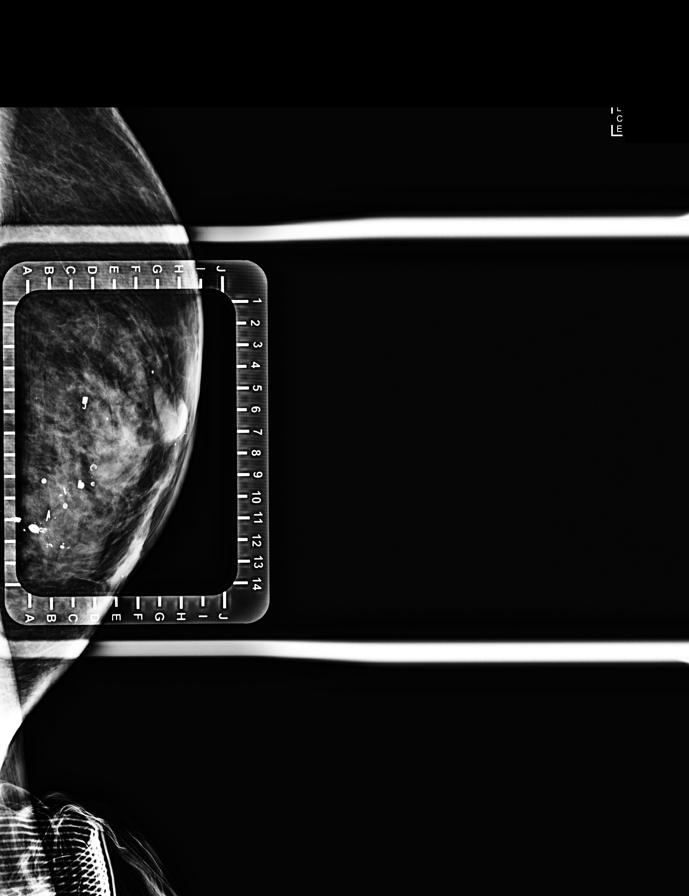

[L ML (2 of 4)]
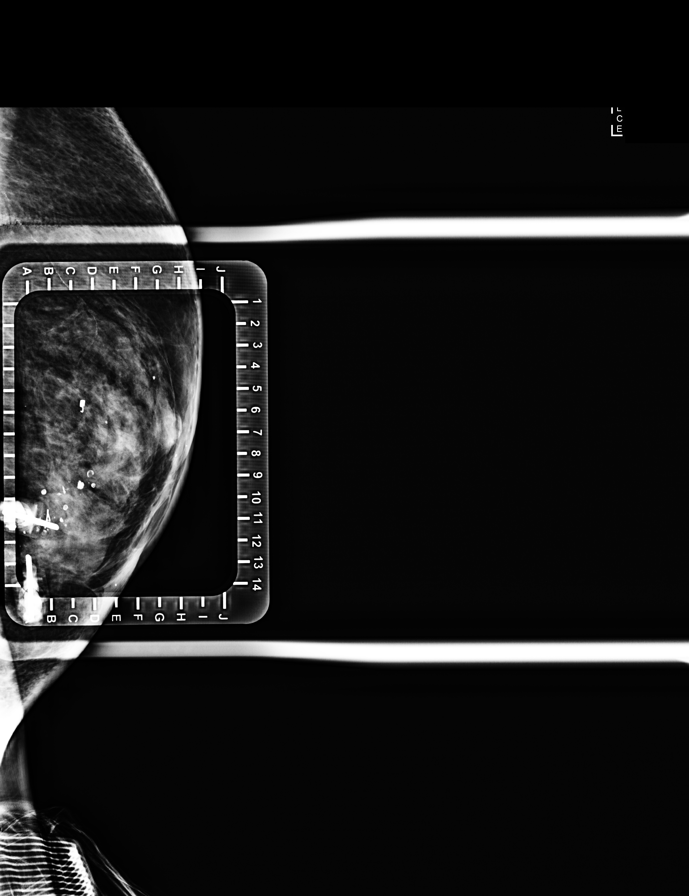

[L CC (3 of 4)]
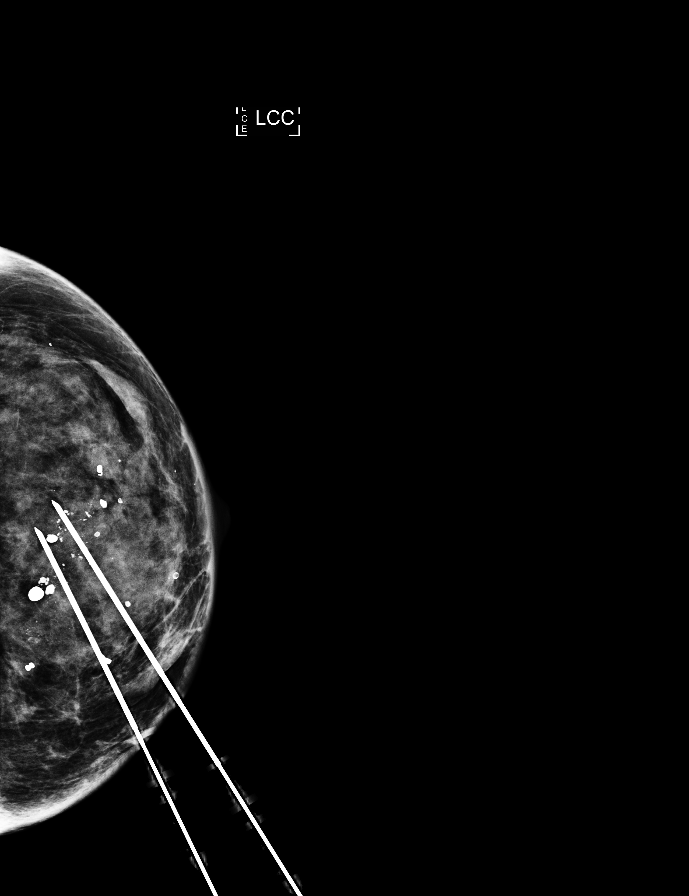

[L ML (3 of 4)]
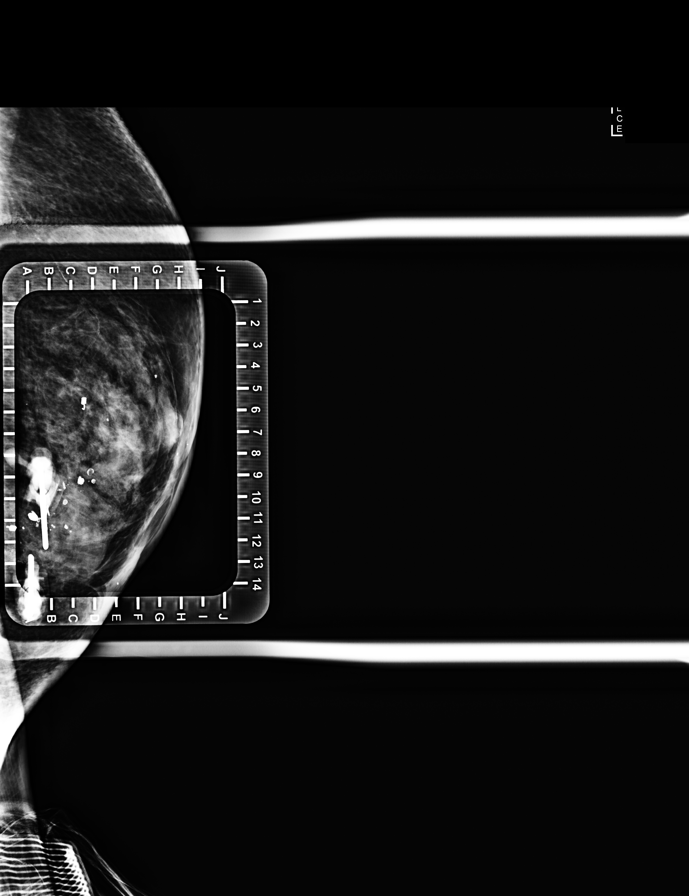

[L CC (4 of 4)]
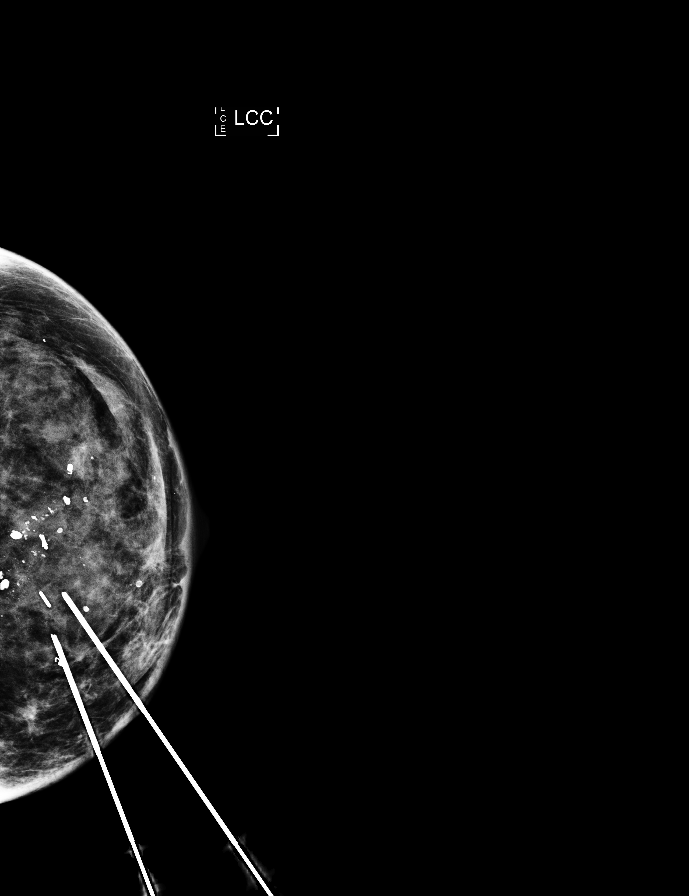

[L ML (4 of 4)]
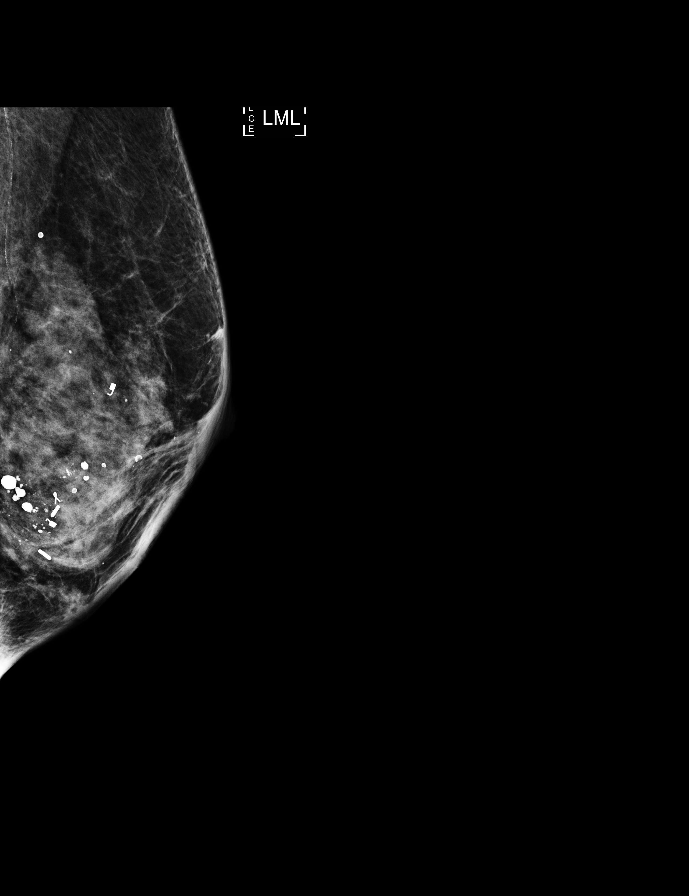

[8 of 10 positions shown; findings below may reference images not displayed]

FINDINGS: Patient presents for radioactive seed localization prior to LEFT
breast lumpectomy. I met with the patient and we discussed the
procedure of seed localization including benefits and alternatives.
We discussed the high likelihood of a successful procedure. We
discussed the risks of the procedure including infection, bleeding,
tissue injury and further surgery. We discussed the low dose of
radioactivity involved in the procedure. Informed, written consent
was given.

The usual time-out protocol was performed immediately prior to the
procedure.

Using mammographic guidance, sterile technique with chlorhexidine as
skin antisepsis, 1% lidocaine as local anesthetic, 2 separate [SK]
radioactive seeds were used to localize the ribbon shaped tissue
marker clip associated with the malignancy in the LOWER OUTER
QUADRANT and to localize the calcifications at the site of the ADH
in the slight LOWER INNER QUADRANT using a medial approach.

The follow-up mammogram images confirm that both seeds are
appropriately positioned. The seed is immediately adjacent to the
coil shaped tissue marker clip associated with the malignancy. The
second seed is at the ANTERIOR margin of the calcifications at the
MRI biopsy site for the atypical ductal hyperplasia. The seeds are
approximately 1 cm apart. The images are marked for Dr. GUTTADAURO.

Follow-up survey of the patient confirms the presence of the
radioactive seeds.

Superolateral seed associated with the malignancy:

Order number of [SK] seed: [PHONE_NUMBER].

Total activity: 0.258 mCi.

Reference Date: [DATE].

Inferomedial seed associated with the ADH calcifications:

Order number of [SK] seed: [PHONE_NUMBER].

Total activity: 0.246 mCi.

Reference Date: [DATE].

The patient tolerated the procedure well and was released from the
[REDACTED]. She was given instructions regarding seed removal.
IMPRESSION: Radioactive seed localization of the LEFT breast x 2. No apparent
complications.

## 2020-06-15 ENCOUNTER — Ambulatory Visit (HOSPITAL_COMMUNITY): Payer: Medicare HMO | Admitting: Vascular Surgery

## 2020-06-15 ENCOUNTER — Ambulatory Visit
Admission: RE | Admit: 2020-06-15 | Discharge: 2020-06-15 | Disposition: A | Discharge status: Home / Self Care | Payer: Medicare HMO | Source: Ambulatory Visit | Attending: Surgery | Admitting: Surgery

## 2020-06-15 ENCOUNTER — Ambulatory Visit (HOSPITAL_COMMUNITY): Payer: Medicare HMO | Admitting: Certified Registered Nurse Anesthetist

## 2020-06-15 ENCOUNTER — Other Ambulatory Visit: Payer: Self-pay

## 2020-06-15 ENCOUNTER — Encounter (HOSPITAL_COMMUNITY)
Admission: RE | Disposition: A | Discharge status: Home / Self Care | Payer: Self-pay | Source: Home / Self Care | Attending: Surgery

## 2020-06-15 ENCOUNTER — Encounter (HOSPITAL_COMMUNITY): Payer: Self-pay | Admitting: Surgery

## 2020-06-15 ENCOUNTER — Ambulatory Visit (HOSPITAL_COMMUNITY)
Admission: RE | Admit: 2020-06-15 | Discharge: 2020-06-15 | Disposition: A | Discharge status: Home / Self Care | Payer: Medicare HMO | Source: Ambulatory Visit | Attending: Surgery | Admitting: Surgery

## 2020-06-15 ENCOUNTER — Ambulatory Visit (HOSPITAL_COMMUNITY)
Admission: RE | Admit: 2020-06-15 | Discharge: 2020-06-15 | Disposition: A | Discharge status: Home / Self Care | Payer: Medicare HMO | Attending: Surgery | Admitting: Surgery

## 2020-06-15 DIAGNOSIS — Z79899 Other long term (current) drug therapy: Secondary | ICD-10-CM | POA: Insufficient documentation

## 2020-06-15 DIAGNOSIS — C50912 Malignant neoplasm of unspecified site of left female breast: Secondary | ICD-10-CM

## 2020-06-15 DIAGNOSIS — C50512 Malignant neoplasm of lower-outer quadrant of left female breast: Secondary | ICD-10-CM | POA: Insufficient documentation

## 2020-06-15 DIAGNOSIS — Z95828 Presence of other vascular implants and grafts: Secondary | ICD-10-CM | POA: Insufficient documentation

## 2020-06-15 DIAGNOSIS — Z9221 Personal history of antineoplastic chemotherapy: Secondary | ICD-10-CM | POA: Insufficient documentation

## 2020-06-15 DIAGNOSIS — I25110 Atherosclerotic heart disease of native coronary artery with unstable angina pectoris: Secondary | ICD-10-CM | POA: Diagnosis not present

## 2020-06-15 DIAGNOSIS — Z880 Allergy status to penicillin: Secondary | ICD-10-CM | POA: Diagnosis not present

## 2020-06-15 DIAGNOSIS — Z87891 Personal history of nicotine dependence: Secondary | ICD-10-CM | POA: Insufficient documentation

## 2020-06-15 DIAGNOSIS — Z881 Allergy status to other antibiotic agents status: Secondary | ICD-10-CM | POA: Diagnosis not present

## 2020-06-15 DIAGNOSIS — N6012 Diffuse cystic mastopathy of left breast: Secondary | ICD-10-CM | POA: Insufficient documentation

## 2020-06-15 DIAGNOSIS — Z7982 Long term (current) use of aspirin: Secondary | ICD-10-CM | POA: Insufficient documentation

## 2020-06-15 DIAGNOSIS — I10 Essential (primary) hypertension: Secondary | ICD-10-CM | POA: Insufficient documentation

## 2020-06-15 DIAGNOSIS — N6092 Unspecified benign mammary dysplasia of left breast: Secondary | ICD-10-CM | POA: Diagnosis not present

## 2020-06-15 DIAGNOSIS — Z79811 Long term (current) use of aromatase inhibitors: Secondary | ICD-10-CM | POA: Insufficient documentation

## 2020-06-15 DIAGNOSIS — Z888 Allergy status to other drugs, medicaments and biological substances status: Secondary | ICD-10-CM | POA: Diagnosis not present

## 2020-06-15 DIAGNOSIS — Z170 Estrogen receptor positive status [ER+]: Secondary | ICD-10-CM | POA: Diagnosis not present

## 2020-06-15 DIAGNOSIS — D630 Anemia in neoplastic disease: Secondary | ICD-10-CM | POA: Diagnosis not present

## 2020-06-15 DIAGNOSIS — G8918 Other acute postprocedural pain: Secondary | ICD-10-CM | POA: Diagnosis not present

## 2020-06-15 HISTORY — PX: BREAST LUMPECTOMY WITH RADIOACTIVE SEED AND SENTINEL LYMPH NODE BIOPSY: SHX6550

## 2020-06-15 IMAGING — MG MM BREAST SURGICAL SPECIMEN
1 series · 1 of 1 positions shown · non-contrast
Comparison: Previous exam(s).

CLINICAL DATA: Left lumpectomy for invasive ductal carcinoma in the
lower outer quadrant of the left breast marked with a ribbon shaped
clip and atypical ductal hyperplasia with residual calcifications
was localized with a radioactive seed following clip migration. The
patient also has had 2 previous left breast benign biopsies with
coil shaped clips.

EXAM:
SPECIMEN RADIOGRAPH OF THE LEFT BREAST

[L]
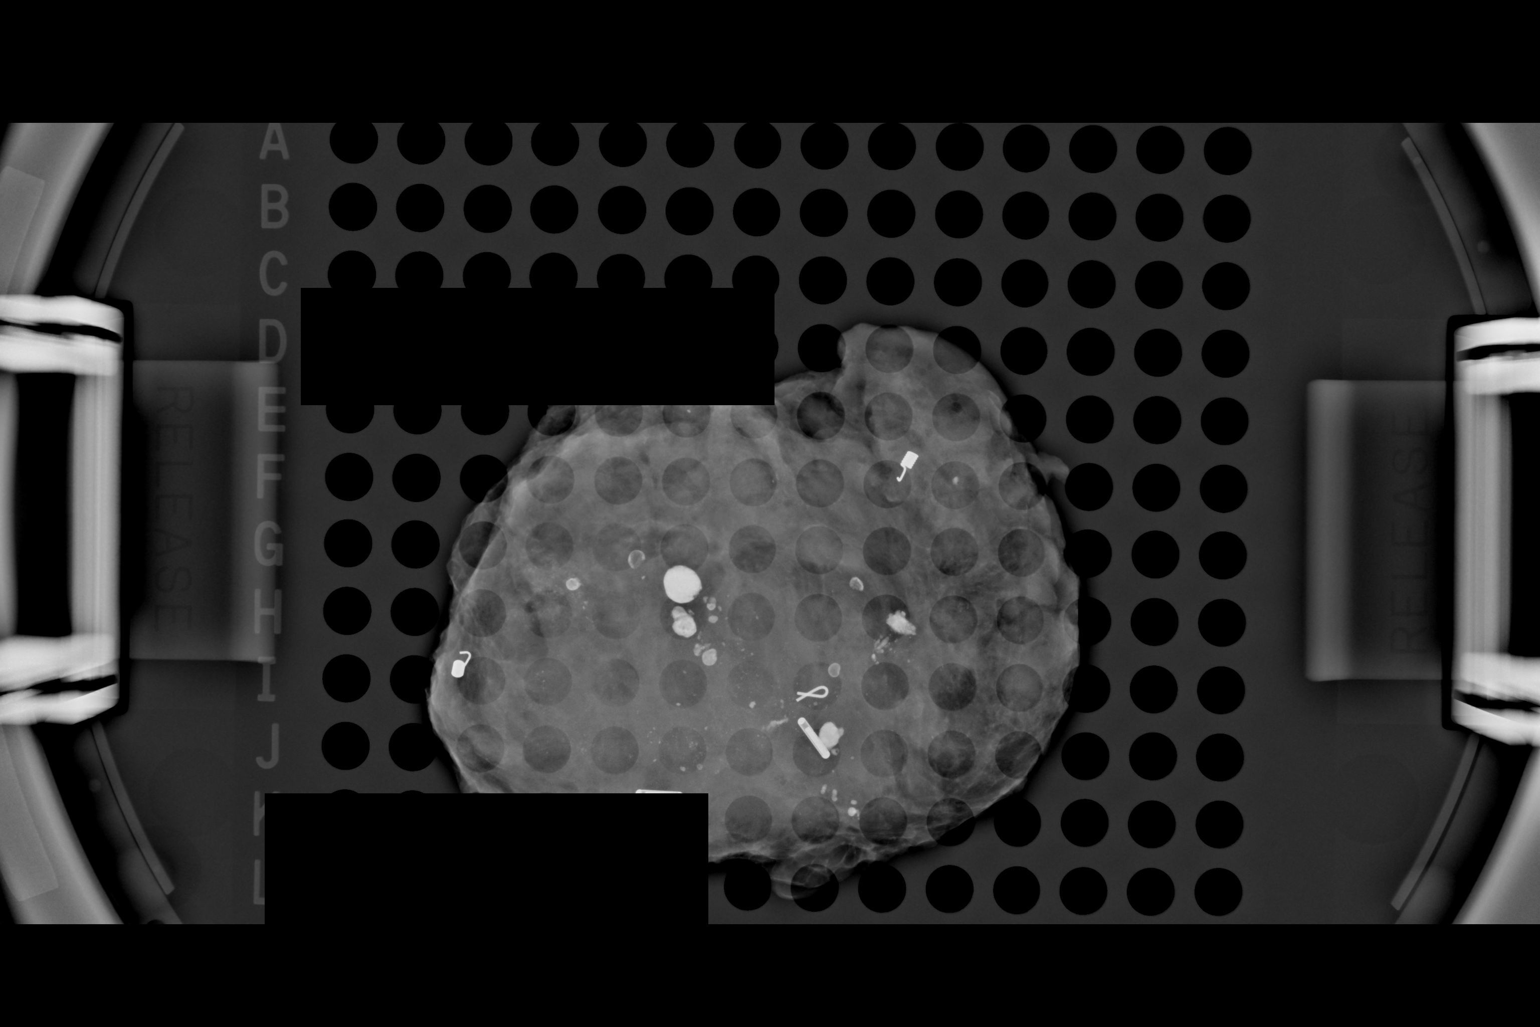

[1 of 1 positions shown; findings below may reference images not displayed]

FINDINGS: Status post excision of the left breast. There are 2 radioactive
seeds, 2 coil shaped clips and 1 ribbon shaped biopsy marker clip in
the specimen, as well as the residual ADH calcifications localized
with 1 of the radioactive seeds. The 2 radioactive seeds, ribbon
shaped biopsy marker clip and residual ADH calcifications were
marked for pathology.
IMPRESSION: Specimen radiograph of the left breast.

## 2020-06-15 SURGERY — BREAST LUMPECTOMY WITH RADIOACTIVE SEED AND SENTINEL LYMPH NODE BIOPSY
Anesthesia: General | Site: Breast | Laterality: Left

## 2020-06-15 MED ORDER — GABAPENTIN 300 MG PO CAPS
300.0000 mg | ORAL_CAPSULE | ORAL | Status: AC
  Administered 2020-06-15: 300 mg via ORAL
  Filled 2020-06-15: qty 1

## 2020-06-15 MED ORDER — ACETAMINOPHEN 500 MG PO TABS
1000.0000 mg | ORAL_TABLET | ORAL | Status: AC
  Administered 2020-06-15: 1000 mg via ORAL
  Filled 2020-06-15: qty 2

## 2020-06-15 MED ORDER — LIDOCAINE 2% (20 MG/ML) 5 ML SYRINGE
INTRAMUSCULAR | Status: AC
  Filled 2020-06-15: qty 10

## 2020-06-15 MED ORDER — MIDAZOLAM HCL 2 MG/2ML IJ SOLN
INTRAMUSCULAR | Status: AC
  Filled 2020-06-15: qty 2

## 2020-06-15 MED ORDER — DEXAMETHASONE SODIUM PHOSPHATE 10 MG/ML IJ SOLN
INTRAMUSCULAR | Status: AC
  Filled 2020-06-15: qty 1

## 2020-06-15 MED ORDER — PHENYLEPHRINE 40 MCG/ML (10ML) SYRINGE FOR IV PUSH (FOR BLOOD PRESSURE SUPPORT)
PREFILLED_SYRINGE | INTRAVENOUS | Status: DC | PRN
  Administered 2020-06-15 (×2): 120 ug via INTRAVENOUS
  Administered 2020-06-15: 200 ug via INTRAVENOUS
  Administered 2020-06-15: 120 ug via INTRAVENOUS
  Administered 2020-06-15: 200 ug via INTRAVENOUS

## 2020-06-15 MED ORDER — SUCCINYLCHOLINE CHLORIDE 200 MG/10ML IV SOSY
PREFILLED_SYRINGE | INTRAVENOUS | Status: DC | PRN
  Administered 2020-06-15: 120 mg via INTRAVENOUS

## 2020-06-15 MED ORDER — BUPIVACAINE HCL (PF) 0.25 % IJ SOLN
INTRAMUSCULAR | Status: AC
  Filled 2020-06-15: qty 30

## 2020-06-15 MED ORDER — FENTANYL CITRATE (PF) 100 MCG/2ML IJ SOLN: 25.0000 ug | INTRAMUSCULAR | Status: DC | PRN

## 2020-06-15 MED ORDER — FENTANYL CITRATE (PF) 100 MCG/2ML IJ SOLN: 100.0000 ug | Freq: Once | INTRAMUSCULAR | Status: AC

## 2020-06-15 MED ORDER — MIDAZOLAM HCL 2 MG/2ML IJ SOLN: 2.0000 mg | Freq: Once | INTRAMUSCULAR | Status: AC

## 2020-06-15 MED ORDER — ORAL CARE MOUTH RINSE: 15.0000 mL | Freq: Once | OROMUCOSAL | Status: AC

## 2020-06-15 MED ORDER — 0.9 % SODIUM CHLORIDE (POUR BTL) OPTIME
TOPICAL | Status: DC | PRN
  Administered 2020-06-15: 1000 mL

## 2020-06-15 MED ORDER — CHLORHEXIDINE GLUCONATE CLOTH 2 % EX PADS: 6.0000 | MEDICATED_PAD | Freq: Once | CUTANEOUS | Status: DC

## 2020-06-15 MED ORDER — LIDOCAINE 2% (20 MG/ML) 5 ML SYRINGE
INTRAMUSCULAR | Status: DC | PRN
  Administered 2020-06-15: 40 mg via INTRAVENOUS

## 2020-06-15 MED ORDER — BUPIVACAINE HCL (PF) 0.25 % IJ SOLN
INTRAMUSCULAR | Status: DC | PRN
  Administered 2020-06-15: 15 mL

## 2020-06-15 MED ORDER — PROPOFOL 500 MG/50ML IV EMUL
INTRAVENOUS | Status: AC
  Filled 2020-06-15: qty 50

## 2020-06-15 MED ORDER — MIDAZOLAM HCL 2 MG/2ML IJ SOLN
INTRAMUSCULAR | Status: AC
  Administered 2020-06-15: 0.5 mg via INTRAVENOUS
  Filled 2020-06-15: qty 2

## 2020-06-15 MED ORDER — CIPROFLOXACIN IN D5W 400 MG/200ML IV SOLN
400.0000 mg | INTRAVENOUS | Status: AC
  Administered 2020-06-15: 400 mg via INTRAVENOUS
  Filled 2020-06-15: qty 200

## 2020-06-15 MED ORDER — ONDANSETRON HCL 4 MG/2ML IJ SOLN
INTRAMUSCULAR | Status: AC
  Filled 2020-06-15: qty 2

## 2020-06-15 MED ORDER — ONDANSETRON HCL 4 MG/2ML IJ SOLN
4.0000 mg | Freq: Once | INTRAMUSCULAR | Status: AC | PRN
  Administered 2020-06-15: 4 mg via INTRAVENOUS

## 2020-06-15 MED ORDER — PROPOFOL 500 MG/50ML IV EMUL
INTRAVENOUS | Status: DC | PRN
  Administered 2020-06-15: 25 ug/kg/min via INTRAVENOUS

## 2020-06-15 MED ORDER — HYDROCODONE-ACETAMINOPHEN 5-325 MG PO TABS: 1.0000 | ORAL_TABLET | Freq: Four times a day (QID) | ORAL | 0 refills | Status: DC | PRN

## 2020-06-15 MED ORDER — ONDANSETRON HCL 4 MG/2ML IJ SOLN
INTRAMUSCULAR | Status: DC | PRN
  Administered 2020-06-15: 4 mg via INTRAVENOUS

## 2020-06-15 MED ORDER — DEXAMETHASONE SODIUM PHOSPHATE 10 MG/ML IJ SOLN
INTRAMUSCULAR | Status: DC | PRN
  Administered 2020-06-15: 8 mg via INTRAVENOUS

## 2020-06-15 MED ORDER — OXYCODONE HCL 5 MG/5ML PO SOLN: 5.0000 mg | Freq: Once | ORAL | Status: DC | PRN

## 2020-06-15 MED ORDER — PROPOFOL 10 MG/ML IV BOLUS
INTRAVENOUS | Status: DC | PRN
  Administered 2020-06-15: 120 mg via INTRAVENOUS

## 2020-06-15 MED ORDER — SODIUM CHLORIDE (PF) 0.9 % IJ SOLN
INTRAMUSCULAR | Status: AC
  Filled 2020-06-15: qty 10

## 2020-06-15 MED ORDER — MIDAZOLAM HCL 2 MG/2ML IJ SOLN
INTRAMUSCULAR | Status: DC | PRN
  Administered 2020-06-15: 1.5 mg via INTRAVENOUS
  Administered 2020-06-15: .5 mg via INTRAVENOUS

## 2020-06-15 MED ORDER — FENTANYL CITRATE (PF) 100 MCG/2ML IJ SOLN
INTRAMUSCULAR | Status: AC
  Administered 2020-06-15: 50 ug via INTRAVENOUS
  Filled 2020-06-15: qty 2

## 2020-06-15 MED ORDER — FENTANYL CITRATE (PF) 250 MCG/5ML IJ SOLN
INTRAMUSCULAR | Status: AC
Start: 2020-06-15 — End: ?
  Filled 2020-06-15: qty 5

## 2020-06-15 MED ORDER — LACTATED RINGERS IV SOLN: INTRAVENOUS | Status: DC

## 2020-06-15 MED ORDER — METHYLENE BLUE 0.5 % INJ SOLN
INTRAVENOUS | Status: AC
  Filled 2020-06-15: qty 10

## 2020-06-15 MED ORDER — OXYCODONE HCL 5 MG PO TABS: 5.0000 mg | ORAL_TABLET | Freq: Once | ORAL | Status: DC | PRN

## 2020-06-15 MED ORDER — TECHNETIUM TC 99M SULFUR COLLOID FILTERED
1.0000 | Freq: Once | INTRAVENOUS | Status: AC | PRN
  Administered 2020-06-15: 1 via INTRADERMAL

## 2020-06-15 MED ORDER — CHLORHEXIDINE GLUCONATE 0.12 % MT SOLN
15.0000 mL | Freq: Once | OROMUCOSAL | Status: AC
  Administered 2020-06-15: 15 mL via OROMUCOSAL

## 2020-06-15 SURGICAL SUPPLY — 45 items
ADH SKN CLS APL DERMABOND .7 (GAUZE/BANDAGES/DRESSINGS) ×1
APL PRP STRL LF DISP 70% ISPRP (MISCELLANEOUS) ×1
APPLIER CLIP 9.375 MED OPEN (MISCELLANEOUS) ×2
APR CLP MED 9.3 20 MLT OPN (MISCELLANEOUS) ×1
BINDER BREAST LRG (GAUZE/BANDAGES/DRESSINGS) ×1 IMPLANT
BINDER BREAST XLRG (GAUZE/BANDAGES/DRESSINGS) IMPLANT
CANISTER SUCT 3000ML PPV (MISCELLANEOUS) ×2 IMPLANT
CHLORAPREP W/TINT 26 (MISCELLANEOUS) ×2 IMPLANT
CLIP APPLIE 9.375 MED OPEN (MISCELLANEOUS) ×1 IMPLANT
CNTNR URN SCR LID CUP LEK RST (MISCELLANEOUS) ×1 IMPLANT
CONT SPEC 4OZ STRL OR WHT (MISCELLANEOUS) ×2
COVER PROBE W GEL 5X96 (DRAPES) ×2 IMPLANT
COVER SURGICAL LIGHT HANDLE (MISCELLANEOUS) ×2 IMPLANT
COVER WAND RF STERILE (DRAPES) IMPLANT
DERMABOND ADVANCED (GAUZE/BANDAGES/DRESSINGS) ×1
DERMABOND ADVANCED .7 DNX12 (GAUZE/BANDAGES/DRESSINGS) ×1 IMPLANT
DEVICE DUBIN SPECIMEN MAMMOGRA (MISCELLANEOUS) ×2 IMPLANT
DRAPE CHEST BREAST 15X10 FENES (DRAPES) ×2 IMPLANT
ELECT CAUTERY BLADE 6.4 (BLADE) ×2 IMPLANT
ELECT REM PT RETURN 9FT ADLT (ELECTROSURGICAL) ×2
ELECTRODE REM PT RTRN 9FT ADLT (ELECTROSURGICAL) ×1 IMPLANT
GLOVE BIO SURGEON STRL SZ8 (GLOVE) ×2 IMPLANT
GLOVE BIOGEL PI IND STRL 8 (GLOVE) ×1 IMPLANT
GLOVE BIOGEL PI INDICATOR 8 (GLOVE) ×1
GOWN STRL REUS W/ TWL LRG LVL3 (GOWN DISPOSABLE) ×1 IMPLANT
GOWN STRL REUS W/ TWL XL LVL3 (GOWN DISPOSABLE) ×1 IMPLANT
GOWN STRL REUS W/TWL LRG LVL3 (GOWN DISPOSABLE) ×2
GOWN STRL REUS W/TWL XL LVL3 (GOWN DISPOSABLE) ×2
KIT BASIN OR (CUSTOM PROCEDURE TRAY) ×2 IMPLANT
KIT MARKER MARGIN INK (KITS) ×2 IMPLANT
LIGHT WAVEGUIDE WIDE FLAT (MISCELLANEOUS) IMPLANT
NDL 18GX1X1/2 (RX/OR ONLY) (NEEDLE) IMPLANT
NDL FILTER BLUNT 18X1 1/2 (NEEDLE) IMPLANT
NDL HYPO 25GX1X1/2 BEV (NEEDLE) ×1 IMPLANT
NEEDLE 18GX1X1/2 (RX/OR ONLY) (NEEDLE) IMPLANT
NEEDLE FILTER BLUNT 18X 1/2SAF (NEEDLE)
NEEDLE FILTER BLUNT 18X1 1/2 (NEEDLE) IMPLANT
NEEDLE HYPO 25GX1X1/2 BEV (NEEDLE) ×2 IMPLANT
NS IRRIG 1000ML POUR BTL (IV SOLUTION) ×2 IMPLANT
PACK GENERAL/GYN (CUSTOM PROCEDURE TRAY) ×2 IMPLANT
SUT MNCRL AB 4-0 PS2 18 (SUTURE) ×2 IMPLANT
SUT VIC AB 3-0 SH 18 (SUTURE) ×2 IMPLANT
SYR CONTROL 10ML LL (SYRINGE) ×2 IMPLANT
TOWEL GREEN STERILE (TOWEL DISPOSABLE) ×2 IMPLANT
TOWEL GREEN STERILE FF (TOWEL DISPOSABLE) ×2 IMPLANT

## 2020-06-15 NOTE — Discharge Instructions (Signed)
[facility] °Office Phone Number [phone] ° °BREAST BIOPSY/ PARTIAL MASTECTOMY: POST OP INSTRUCTIONS ° °Always review your discharge instruction sheet given to you by the facility where your surgery was performed. ° °IF YOU HAVE DISABILITY OR FAMILY LEAVE FORMS, YOU MUST BRING THEM TO THE OFFICE FOR PROCESSING.  DO NOT GIVE THEM TO YOUR DOCTOR. ° °1. A prescription for pain medication may be given to you upon discharge.  Take your pain medication as prescribed, if needed.  If narcotic pain medicine is not needed, then you may take acetaminophen (Tylenol) or ibuprofen (Advil) as needed. °2. Take your usually prescribed medications unless otherwise directed °3. If you need a refill on your pain medication, please contact your pharmacy.  They will contact our office to request authorization.  Prescriptions will not be filled after 5pm or on week-ends. °4. You should eat very light the first 24 hours after surgery, such as soup, crackers, pudding, etc.  Resume your normal diet the day after surgery. °5. Most patients will experience some swelling and bruising in the breast.  Ice packs and a good support bra will help.  Swelling and bruising can take several days to resolve.  °6. It is common to experience some constipation if taking pain medication after surgery.  Increasing fluid intake and taking a stool softener will usually help or prevent this problem from occurring.  A mild laxative (Milk of Magnesia or Miralax) should be taken according to package directions if there are no bowel movements after 48 hours. °7. Unless discharge instructions indicate otherwise, you may remove your bandages 24-48 hours after surgery, and you may shower at that time.  You may have steri-strips (small skin tapes) in place directly over the incision.  These strips should be left on the skin for 7-10 days.  If your surgeon used skin glue on the incision, you may shower in 24 hours.  The glue will flake off over the  next 2-3 weeks.  Any sutures or staples will be removed at the office during your follow-up visit. °8. ACTIVITIES:  You may resume regular daily activities (gradually increasing) beginning the next day.  Wearing a good support bra or sports bra minimizes pain and swelling.  You may have sexual intercourse when it is comfortable. °a. You may drive when you no longer are taking prescription pain medication, you can comfortably wear a seatbelt, and you can safely maneuver your car and apply brakes. °b. RETURN TO WORK:  ______________________________________________________________________________________ °9. You should see your doctor in the office for a follow-up appointment approximately two weeks after your surgery.  Your doctor’s nurse will typically make your follow-up appointment when she calls you with your pathology report.  Expect your pathology report 2-3 business days after your surgery.  You may call to check if you do not hear from us after three days. °10. OTHER INSTRUCTIONS: _______________________________________________________________________________________________ _____________________________________________________________________________________________________________________________________ °_____________________________________________________________________________________________________________________________________ °_____________________________________________________________________________________________________________________________________ ° °WHEN TO CALL YOUR DOCTOR: °1. Fever over 101.0 °2. Nausea and/or vomiting. °3. Extreme swelling or bruising. °4. Continued bleeding from incision. °5. Increased pain, redness, or drainage from the incision. ° °The clinic staff is available to answer your questions during regular business hours.  Please don’t hesitate to call and ask to speak to one of the nurses for clinical concerns.  If you have a medical emergency, go to the nearest  emergency room or call 911.  A surgeon from [facility] is always on call at the hospital. ° °For further questions, please visit [email or web address]  °

## 2020-06-15 NOTE — Anesthesia Postprocedure Evaluation (Signed)
Anesthesia Post Note  Patient: Hallie Ertl Aguila  Procedure(s) Performed: LEFT BREAST LUMPECTOMYX 2 WITH RADIOACTIVE SEED AND SENTINEL LYMPH NODE BIOPSY (Left Breast)     Patient location during evaluation: PACU Anesthesia Type: General Level of consciousness: awake and alert Pain management: pain level controlled Vital Signs Assessment: post-procedure vital signs reviewed and stable Respiratory status: spontaneous breathing, nonlabored ventilation, respiratory function stable and patient connected to nasal cannula oxygen Cardiovascular status: blood pressure returned to baseline and stable Postop Assessment: no apparent nausea or vomiting Anesthetic complications: no   No complications documented.  Last Vitals:  Vitals:   06/15/20 1115 06/15/20 1130  BP: (!) 144/65 (!) 150/67  Pulse: 72 73  Resp: 14 16  Temp: (!) 36.2 C   SpO2: 99% 98%    Last Pain:  Vitals:   06/15/20 1115  TempSrc:   PainSc: 0-No pain                 Jaide Hillenburg COKER

## 2020-06-15 NOTE — Transfer of Care (Signed)
Immediate Anesthesia Transfer of Care Note  [name]  Procedure(s) Performed: LEFT BREAST LUMPECTOMYX 2 WITH RADIOACTIVE SEED AND SENTINEL LYMPH NODE BIOPSY (Left Breast)  Patient Location: PACU  Anesthesia Type:General  Level of Consciousness: drowsy  Airway & Oxygen Therapy: Patient Spontanous Breathing and Patient connected to nasal cannula oxygen  Post-op Assessment: Report given to RN and Post -op Vital signs reviewed and stable  Post vital signs: Reviewed and stable  Last Vitals:  Vitals Value Taken Time  BP 132/64 06/15/20 1044  Temp    Pulse 80 06/15/20 1048  Resp 17 06/15/20 1048  SpO2 100 % 06/15/20 1048  Vitals shown include unvalidated device data.  Last Pain:  Vitals:   06/15/20 0915  TempSrc:   PainSc: 0-No pain      Patients Stated Pain Goal: 3 (41/63/[id number])  Complications: No complications documented.

## 2020-06-15 NOTE — Interval H&P Note (Signed)
History and Physical Interval Note:  06/15/2020 8:43 AM  Rachel Valentine  has presented today for surgery, with the diagnosis of LEFT BREAST CANCER.  The various methods of treatment have been discussed with the patient and family. After consideration of risks, benefits and other options for treatment, the patient has consented to  Procedure(s) with comments: LEFT BREAST LUMPECTOMYX 2 WITH RADIOACTIVE SEED AND SENTINEL LYMPH NODE BIOPSY (Left) - PECTORAL BLOCK as a surgical intervention.  The patient's history has been reviewed, patient examined, no change in status, stable for surgery.  I have reviewed the patient's chart and labs.  Questions were answered to the patient's satisfaction.     [place]

## 2020-06-15 NOTE — Anesthesia Procedure Notes (Signed)
Procedure Name: Intubation Date/Time: 06/15/2020 9:40 AM Performed by: Leonor Liv, CRNA Pre-anesthesia Checklist: Patient identified, Emergency Drugs available, Suction available and Patient being monitored Patient Re-evaluated:Patient Re-evaluated prior to induction Oxygen Delivery Method: Circle System Utilized Preoxygenation: Pre-oxygenation with 100% oxygen Induction Type: IV induction, Cricoid Pressure applied and Rapid sequence Laryngoscope Size: Mac and 3 Tube type: Oral Tube size: 7.0 mm Number of attempts: 1 Airway Equipment and Method: Stylet and Oral airway Placement Confirmation: ETT inserted through vocal cords under direct vision,  positive ETCO2 and breath sounds checked- equal and bilateral Secured at: 20 cm Tube secured with: Tape Dental Injury: Teeth and Oropharynx as per pre-operative assessment

## 2020-06-15 NOTE — Anesthesia Procedure Notes (Signed)
Anesthesia Regional Block: Pectoralis block   Pre-Anesthetic Checklist: ,, timeout performed, Correct Patient, Correct Site, Correct Laterality, Correct Procedure, Correct Position, site marked, Risks and benefits discussed,  Surgical consent,  Pre-op evaluation,  At surgeon's request and post-op pain management  Laterality: Left  Prep: chloraprep       Needles:  Injection technique: Single-shot  Needle Type: Echogenic Needle     Needle Length: 9cm  Needle Gauge: 21     Additional Needles:   Narrative:  Start time: 06/15/2020 8:50 AM End time: 06/15/2020 9:00 AM Injection made incrementally with aspirations every 5 mL.  Performed by: Personally  Anesthesiologist: Roberts Gaudy, MD  Additional Notes: 30 cc 0.5% Bupivacaine with 1:200 epi

## 2020-06-15 NOTE — Op Note (Signed)
Preoperative diagnosis: Stage I left breast cancer lower outer quadrant  Postoperative diagnosis: Same  Procedure: Left breast seed localized lumpectomy using 2 localizing seeds and left breast axillary sentinel lymph node mapping  Surgeon: [name], MD  Anesthesia: LMA with local and pectoral block  EBL: Minimal  Specimen: Left breast tissue with 2 seeds and 3 clips verified by Faxitron and 3 left axillary sentinel nodes hot  Drains: None  IV fluids: Per record  EBL: Minimal  Indications for procedure: The patient is a 79 year old female with left breast cancer.  She has a Port-A-Cath in place and has been treated with neoadjuvant chemotherapy due to HER-2/neu positivity..  She presents today for lumpectomy.  She has had neoadjuvant chemotherapy with reduction in size of area in her breast.  She presents for lumpectomy today with sentinel lymph node mapping.The procedure has been discussed with the patient. Alternatives to surgery have been discussed with the patient.  Risks of surgery include bleeding,  Infection,  Seroma formation, death,  and the need for further surgery.   The patient understands and wishes to proceed.Sentinel lymph node mapping and dissection has been discussed with the patient.  Risk of bleeding,  Infection,  Seroma formation,  Additional procedures,,  Shoulder weakness ,  Shoulder stiffness,  Nerve and blood vessel injury and reaction to the mapping dyes have been discussed.  Alternatives to surgery have been discussed with the patient.  The patient agrees to proceed.  Description of procedure: The patient was met in the holding area.  Left breast was marked as correct site neoprobe used to verify the location of both seeds in the left lower inner and lower outer quadrant.  She underwent pectoral block per anesthesia and injection of technetium sulfur colloid per radiology protocol.  All questions were answered.  She is brought back to the operative room.  She is  placed supine upon the OR table.  Induction general esthesia, left breast was prepped and draped in sterile fashion and timeout performed.  She received appropriate antibiotics.  Films were available for review.  Local anesthetic was infiltrated along the inframammary fold where the scar was from previous breast reduction.  Incision was made at this point dissection was carried up toward the nipple use the neoprobe as a guide.  All tissue around both seeds and both clips were excised.  The third clip which was benign was included in the specimen given its proximity to the first 2.  Gross margins were negative.  The Faxitron revealed all 3 clips in both seeds to present.  This was oriented with ink and passed off the field.  Wound was irrigated and made hemostatic with cautery.  Was closed with 3-0 Vicryl and 4 Monocryl after clipping the cavity.  The neoprobe settings were placed to technetium.  Hotspot identified in the left axilla.  Dissection was made after incision was placed in the upper part of the left axilla of approximately 2 cm.  Dissection was carried down in 3 hot nodes were identified and removed.  Background counts approached 0.  These were excised.  Methylene blue dye was not used due to the strong signal from the technetium tracer.  Cavities made hemostatic with cautery irrigated and closed with 3-0 Vicryl for Monocryl.  Dermabond applied.  Breast binder placed.  All counts found to be correct.  The patient awoke extubated taken to recovery in satisfactory condition.

## 2020-06-16 ENCOUNTER — Encounter (HOSPITAL_COMMUNITY): Payer: Self-pay | Admitting: Surgery

## 2020-06-16 LAB — SURGICAL PATHOLOGY

## 2020-06-19 ENCOUNTER — Other Ambulatory Visit: Payer: Self-pay | Admitting: *Deleted

## 2020-06-19 DIAGNOSIS — Z170 Estrogen receptor positive status [ER+]: Secondary | ICD-10-CM

## 2020-06-19 DIAGNOSIS — C50512 Malignant neoplasm of lower-outer quadrant of left female breast: Secondary | ICD-10-CM

## 2020-06-20 NOTE — Progress Notes (Signed)
Patient Care Team: Rachel Smoker, MD as PCP - General (Family Medicine) Rachel Harp, MD as PCP - Cardiology (Cardiology) Rachel Luna, MD as Surgeon (General Surgery) Rachel Germany, RN as Oncology Nurse Navigator Rachel Kaufmann, RN as Oncology Nurse Navigator  DIAGNOSIS:    ICD-10-CM   1. Malignant neoplasm of lower-outer quadrant of left breast of female, estrogen receptor positive ([place])  C50.512    Z17.0     SUMMARY OF ONCOLOGIC HISTORY: Oncology History  Malignant neoplasm of lower-outer quadrant of left breast of female, estrogen receptor positive ([place])  01/18/2020 Initial Diagnosis   Palpable left breast lump with calcifications at 5 o'clock position measuring 3.3 cm.  Biopsy revealed grade 3 IDC ER 95%, PR 0%, HER-2 3+ positive, Ki-67 50%, axilla negative, T2N0   01/26/2020 Cancer Staging   Staging form: Breast, AJCC 8th Edition - Clinical stage from 01/26/2020: Stage IIA (cT2, cN0, cM0, G3, ER+, PR-, HER2+) - Signed by Rachel Lose, MD on 01/26/2020   02/16/2020 - 05/13/2020 Chemotherapy   The patient had PACLitaxel-protein bound (ABRAXANE) chemo infusion 125 mg, 80 mg/m2 = 125 mg (100 % of original dose 80 mg/m2), Intravenous,  Once, 3 of 3 cycles Dose modification: 80 mg/m2 (original dose 80 mg/m2, Cycle 2) Administration: 125 mg (03/22/2020), 125 mg (03/29/2020), 125 mg (04/06/2020), 125 mg (04/12/2020), 125 mg (04/20/2020), 125 mg (04/26/2020), 125 mg (05/05/2020), 125 mg (05/13/2020) PACLitaxel (TAXOL) 120 mg in sodium chloride 0.9 % 250 mL chemo infusion (</= 40m/m2), 80 mg/m2 = 120 mg, Intravenous,  Once, 1 of 1 cycle Administration: 120 mg (02/16/2020), 120 mg (02/23/2020), 120 mg (03/01/2020), 120 mg (03/08/2020) trastuzumab-anns (KANJINTI) 189 mg in sodium chloride 0.9 % 250 mL chemo infusion, 4 mg/kg = 189 mg, Intravenous,  Once, 3 of 3 cycles Dose modification: 6 mg/kg (original dose 2 mg/kg, Cycle 3, Reason: Other (see comments), Comment: maintenance  Trastuzumab to start) Administration: 189 mg (02/16/2020), 105 mg (02/23/2020), 105 mg (03/01/2020), 105 mg (03/08/2020), 105 mg (03/15/2020), 105 mg (03/22/2020), 105 mg (03/29/2020), 105 mg (04/06/2020), 105 mg (04/12/2020), 105 mg (04/20/2020), 105 mg (04/26/2020), 300 mg (05/05/2020)  for chemotherapy treatment.    05/31/2020 -  Chemotherapy   The patient had trastuzumab-anns (KANJINTI) 300 mg in sodium chloride 0.9 % 250 mL chemo infusion, 300 mg, Intravenous,  Once, 1 of 13 cycles Dose modification: 6 mg/kg (original dose 6 mg/kg, Cycle 1, Reason: Other (see comments), Comment: biosimilar) Administration: 300 mg (05/31/2020)  for chemotherapy treatment.    06/15/2020 Surgery   Left lumpectomy ([facility]): no residual carcinoma, 4 left axillary lymph nodes negative for carcinoma     CHIEF COMPLIANT: Follow-up s/p left lumpectomy   INTERVAL HISTORY: Rachel Ehrleris a 79y.o. with above-mentioned history of left breastcancer who completed neoadjuvant chemotherapy withAbraxane andHerceptin.She underwent a left lumpectomy on 06/15/20 with Dr. CBrantley Stagefor which pathology showed no residual carcinoma, 4 left axillary lymph nodes negative for carcinoma. Echo on 05/16/20 showed an ejection fraction of 55-60%. She presents to the clinic today to review the pathology report and discuss further treatment.   ALLERGIES:  is allergic to paclitaxel, fosamax [alendronate], prednisone, clindamycin/lincomycin, and penicillins.  MEDICATIONS:  Current Outpatient Medications  Medication Sig Dispense Refill  . Biotin 10000 MCG TABS Take 10,000 mg by mouth daily.     . calcium citrate (CALCITRATE - DOSED IN MG ELEMENTAL CALCIUM) 950 (200 Ca) MG tablet Take 200 mg of elemental calcium by mouth 2 (two) times daily.  (Patient  not taking: Reported on 05/30/2020)    . Calcium-Phosphorus-Vitamin D (CITRACAL +D3 PO) Take 1 tablet by mouth in the morning and at bedtime.     . diphenhydrAMINE (BENADRYL) 25 mg capsule  Take 25 mg by mouth at bedtime as needed for allergies.     Marland Kitchen HYDROcodone-acetaminophen (NORCO/VICODIN) 5-325 MG tablet Take 1 tablet by mouth every 6 (six) hours as needed for moderate pain. 15 tablet 0  . ipratropium (ATROVENT) 0.03 % nasal spray Place 2 sprays into both nostrils daily.    . metoprolol succinate (TOPROL-XL) 25 MG 24 hr tablet Take 25 mg by mouth daily.    . nitroGLYCERIN (NITROSTAT) 0.4 MG SL tablet Place 1 tablet (0.4 mg total) under the tongue every 5 (five) minutes as needed for chest pain. 30 tablet 0  . pantoprazole (PROTONIX) 40 MG tablet TAKE 1 TABLET BY MOUTH EVERY DAY (Patient taking differently: Take 40 mg by mouth daily. ) 90 tablet 2  . prochlorperazine (COMPAZINE) 10 MG tablet Take 10 mg by mouth every 6 (six) hours as needed for nausea or vomiting.    Marland Kitchen Propylene Glycol (SYSTANE BALANCE) 0.6 % SOLN Place 2 drops into both eyes daily as needed (Dry eyes).     . rosuvastatin (CRESTOR) 40 MG tablet Take 40 mg by mouth daily.      No current facility-administered medications for this visit.    PHYSICAL EXAMINATION: ECOG PERFORMANCE STATUS: 1 - Symptomatic but completely ambulatory  Vitals:   06/21/20 0914  BP: (!) 122/57  Pulse: 67  Resp: 19  Temp: 98.1 F (36.7 C)  SpO2: 98%   Filed Weights   06/21/20 0914  Weight: 101 lb 12.8 oz (46.2 kg)    BREAST: No palpable masses or nodules in either right or left breasts. No palpable axillary supraclavicular or infraclavicular adenopathy no breast tenderness or nipple discharge. (exam performed in the presence of a chaperone)  LABORATORY DATA:  I have reviewed the data as listed CMP Latest Ref Rng & Units 06/09/2020 05/10/2020 05/05/2020  Glucose 70 - 99 mg/dL 96 123(H) 114(H)  BUN 8 - 23 mg/dL 16 19 24(H)  Creatinine 0.44 - 1.00 mg/dL 0.70 0.75 0.68  Sodium 135 - 145 mmol/L 139 140 138  Potassium 3.5 - 5.1 mmol/L 4.4 4.2 3.8  Chloride 98 - 111 mmol/L 104 107 105  CO2 22 - 32 mmol/L 25 27 25   Calcium 8.9 -  10.3 mg/dL 9.7 9.2 9.2  Total Protein 6.5 - 8.1 g/dL 6.8 6.5 6.6  Total Bilirubin 0.3 - 1.2 mg/dL 0.2(L) 0.4 0.4  Alkaline Phos 38 - 126 U/L 79 72 74  AST 15 - 41 U/L 16 20 17   ALT 0 - 44 U/L 10 8 <6    Lab Results  Component Value Date   WBC 7.2 06/21/2020   HGB 10.7 (L) 06/21/2020   HCT 34.5 (L) 06/21/2020   MCV 89.6 06/21/2020   PLT 323 06/21/2020   NEUTROABS 4.4 06/21/2020    ASSESSMENT & PLAN:  Malignant neoplasm of lower-outer quadrant of left breast of female, estrogen receptor positive ([place]) 01/18/2020:Palpable left breast lump with calcifications at 5 o'clock position measuring 3.3 cm. Biopsy revealed grade 3 IDC ER 95%, PR 0%, HER-2 3+ positive, Ki-67 50%, axilla negative, T2N0 stage IIa clinical stage Bilateral implants removed 2005  Treatment plan: 1.Neo- adjuvant chemotherapy with Taxol Herceptin 2.Breast conserving surgery with sentinel lymph node biopsy 3.Adjuvant radiation therapy followed by 4.Adjuvant antiestrogen therapywith anastrozole 1 mg daily x5 years  Breast MRI: 2.3 cm mass, indeterminate 8 mm mass (Biopsy: ADH), no LN --------------------------------------------------------------------------------------------------------------------------------------------------------------- 06/15/20: Left lumpectomy ([facility]): no residual carcinoma, 4 left axillary lymph nodes negative for carcinoma  Pathology counseling: I discussed the final pathology report of the patient provided  a copy of this report. I discussed the margins as well as lymph node surgeries. We also discussed the final staging along with previously performed ER/PR and HER-2/neu testing. She has excellent prognosis since she had a path CR  Plan: 1. Anti-HER-2 therapy  2. adjuvant radiation therapy followed by 3.Adjuvant antiestrogen therapywith anastrozole 1 mg daily x5 years   Every 3-week Herceptin will be continued. I will see the patient every 6 weeks.  No orders of the  defined types were placed in this encounter.  The patient has a good understanding of the overall plan. she agrees with it. she will call with any problems that may develop before the next visit here.  Total time spent: 30 mins including face to face time and time spent for planning, charting and coordination of care  Rachel Lose, MD 06/21/2020  I, Cloyde Reams Dorshimer, am acting as scribe for Dr. Nicholas Valentine.  I have reviewed the above documentation for accuracy and completeness, and I agree with the above.

## 2020-06-21 ENCOUNTER — Encounter: Payer: Self-pay | Admitting: *Deleted

## 2020-06-21 ENCOUNTER — Inpatient Hospital Stay: Payer: Medicare HMO

## 2020-06-21 ENCOUNTER — Inpatient Hospital Stay: Payer: Medicare HMO | Attending: Hematology and Oncology

## 2020-06-21 ENCOUNTER — Inpatient Hospital Stay: Payer: Medicare HMO | Admitting: Hematology and Oncology

## 2020-06-21 ENCOUNTER — Other Ambulatory Visit: Payer: Self-pay

## 2020-06-21 DIAGNOSIS — C50512 Malignant neoplasm of lower-outer quadrant of left female breast: Secondary | ICD-10-CM

## 2020-06-21 DIAGNOSIS — Z5112 Encounter for antineoplastic immunotherapy: Secondary | ICD-10-CM | POA: Diagnosis present

## 2020-06-21 DIAGNOSIS — Z170 Estrogen receptor positive status [ER+]: Secondary | ICD-10-CM

## 2020-06-21 DIAGNOSIS — Z95828 Presence of other vascular implants and grafts: Secondary | ICD-10-CM

## 2020-06-21 DIAGNOSIS — Z79811 Long term (current) use of aromatase inhibitors: Secondary | ICD-10-CM | POA: Insufficient documentation

## 2020-06-21 DIAGNOSIS — Z9221 Personal history of antineoplastic chemotherapy: Secondary | ICD-10-CM | POA: Insufficient documentation

## 2020-06-21 LAB — CBC WITH DIFFERENTIAL (CANCER CENTER ONLY)
Abs Immature Granulocytes: 0.01 10*3/uL (ref 0.00–0.07)
Basophils Absolute: 0.1 10*3/uL (ref 0.0–0.1)
Basophils Relative: 1 %
Eosinophils Absolute: 1 10*3/uL — ABNORMAL HIGH (ref 0.0–0.5)
Eosinophils Relative: 13 %
HCT: 34.5 % — ABNORMAL LOW (ref 36.0–46.0)
Hemoglobin: 10.7 g/dL — ABNORMAL LOW (ref 12.0–15.0)
Immature Granulocytes: 0 %
Lymphocytes Relative: 15 %
Lymphs Abs: 1.1 10*3/uL (ref 0.7–4.0)
MCH: 27.8 pg (ref 26.0–34.0)
MCHC: 31 g/dL (ref 30.0–36.0)
MCV: 89.6 fL (ref 80.0–100.0)
Monocytes Absolute: 0.6 10*3/uL (ref 0.1–1.0)
Monocytes Relative: 9 %
Neutro Abs: 4.4 10*3/uL (ref 1.7–7.7)
Neutrophils Relative %: 62 %
Platelet Count: 323 10*3/uL (ref 150–400)
RBC: 3.85 MIL/uL — ABNORMAL LOW (ref 3.87–5.11)
RDW: 16.6 % — ABNORMAL HIGH (ref 11.5–15.5)
WBC Count: 7.2 10*3/uL (ref 4.0–10.5)
nRBC: 0 % (ref 0.0–0.2)

## 2020-06-21 LAB — CMP (CANCER CENTER ONLY)
ALT: 7 U/L (ref 0–44)
AST: 17 U/L (ref 15–41)
Albumin: 3.7 g/dL (ref 3.5–5.0)
Alkaline Phosphatase: 84 U/L (ref 38–126)
Anion gap: 7 (ref 5–15)
BUN: 16 mg/dL (ref 8–23)
CO2: 27 mmol/L (ref 22–32)
Calcium: 9.4 mg/dL (ref 8.9–10.3)
Chloride: 105 mmol/L (ref 98–111)
Creatinine: 0.79 mg/dL (ref 0.44–1.00)
GFR, Estimated: >60 mL/min (ref >60)
Glucose, Bld: 117 mg/dL — ABNORMAL HIGH (ref 70–99)
Potassium: 4.4 mmol/L (ref 3.5–5.1)
Sodium: 139 mmol/L (ref 135–145)
Total Bilirubin: 0.3 mg/dL (ref 0.3–1.2)
Total Protein: 6.5 g/dL (ref 6.5–8.1)

## 2020-06-21 MED ORDER — SODIUM CHLORIDE 0.9 % IV SOLN
Freq: Once | INTRAVENOUS | Status: AC
  Filled 2020-06-21: qty 250

## 2020-06-21 MED ORDER — TRASTUZUMAB-ANNS CHEMO 150 MG IV SOLR
300.0000 mg | Freq: Once | INTRAVENOUS | Status: AC
  Administered 2020-06-21: 300 mg via INTRAVENOUS
  Filled 2020-06-21: qty 14.29

## 2020-06-21 MED ORDER — SODIUM CHLORIDE 0.9% FLUSH
10.0000 mL | INTRAVENOUS | Status: DC | PRN
  Administered 2020-06-21: 10 mL
  Filled 2020-06-21: qty 10

## 2020-06-21 MED ORDER — ACETAMINOPHEN 325 MG PO TABS
650.0000 mg | ORAL_TABLET | Freq: Once | ORAL | Status: AC
  Administered 2020-06-21: 650 mg via ORAL

## 2020-06-21 MED ORDER — SODIUM CHLORIDE 0.9% FLUSH
10.0000 mL | Freq: Once | INTRAVENOUS | Status: AC
  Administered 2020-06-21: 10 mL
  Filled 2020-06-21: qty 10

## 2020-06-21 MED ORDER — HEPARIN SOD (PORK) LOCK FLUSH 100 UNIT/ML IV SOLN
500.0000 [IU] | Freq: Once | INTRAVENOUS | Status: AC | PRN
  Administered 2020-06-21: 500 [IU]
  Filled 2020-06-21: qty 5

## 2020-06-21 MED ORDER — DIPHENHYDRAMINE HCL 25 MG PO CAPS
25.0000 mg | ORAL_CAPSULE | Freq: Once | ORAL | Status: AC
  Administered 2020-06-21: 25 mg via ORAL

## 2020-06-21 MED ORDER — ACETAMINOPHEN 325 MG PO TABS
ORAL_TABLET | ORAL | Status: AC
  Filled 2020-06-21: qty 2

## 2020-06-21 MED ORDER — DIPHENHYDRAMINE HCL 25 MG PO CAPS
ORAL_CAPSULE | ORAL | Status: AC
  Filled 2020-06-21: qty 1

## 2020-06-21 NOTE — Patient Instructions (Signed)
[facility] Discharge Instructions for Patients Receiving Chemotherapy  Today you received the following chemotherapy agents: Trastuzumab   To help prevent nausea and vomiting after your treatment, we encourage you to take your nausea medication  as prescribed.    If you develop nausea and vomiting that is not controlled by your nausea medication, call the clinic.   BELOW ARE SYMPTOMS THAT SHOULD BE REPORTED IMMEDIATELY:  *FEVER GREATER THAN 100.5 F  *CHILLS WITH OR WITHOUT FEVER  NAUSEA AND VOMITING THAT IS NOT CONTROLLED WITH YOUR NAUSEA MEDICATION  *UNUSUAL SHORTNESS OF BREATH  *UNUSUAL BRUISING OR BLEEDING  TENDERNESS IN MOUTH AND THROAT WITH OR WITHOUT PRESENCE OF ULCERS  *URINARY PROBLEMS  *BOWEL PROBLEMS  UNUSUAL RASH Items with * indicate a potential emergency and should be followed up as soon as possible.  Feel free to call the clinic should you have any questions or concerns. The clinic phone number is [phone].  Please show the CHEMO ALERT CARD at check-in to the Emergency Department and triage nurse.   

## 2020-06-21 NOTE — Patient Instructions (Signed)

## 2020-06-21 NOTE — Assessment & Plan Note (Signed)
01/18/2020:Palpable left breast lump with calcifications at 5 o'clock position measuring 3.3 cm. Biopsy revealed grade 3 IDC ER 95%, PR 0%, HER-2 3+ positive, Ki-67 50%, axilla negative, T2N0 stage IIa clinical stage Bilateral implants removed 2005  Treatment plan: 1.Neo- adjuvant chemotherapy with Taxol Herceptin 2.Breast conserving surgery with sentinel lymph node biopsy 3.Adjuvant radiation therapy followed by 4.Adjuvant antiestrogen therapywith anastrozole 1 mg daily x5 years  Breast MRI: 2.3 cm mass, indeterminate 8 mm mass (Biopsy: ADH), no LN --------------------------------------------------------------------------------------------------------------------------------------------------------------- 06/15/20: Left lumpectomy ([facility]): no residual carcinoma, 4 left axillary lymph nodes negative for carcinoma  Pathology counseling: I discussed the final pathology report of the patient provided  a copy of this report. I discussed the margins as well as lymph node surgeries. We also discussed the final staging along with previously performed ER/PR and HER-2/neu testing. She has excellent prognosis since she had a path CR  Plan: 1. Adjuvant radiation therapy followed by 2.Adjuvant antiestrogen therapywith anastrozole 1 mg daily x5 years  RTC after RT to start HT

## 2020-06-22 ENCOUNTER — Telehealth: Payer: Self-pay | Admitting: Hematology and Oncology

## 2020-06-22 NOTE — Telephone Encounter (Signed)
No 10/20 los, no changes made to pt schedule

## 2020-06-23 ENCOUNTER — Other Ambulatory Visit: Payer: Medicare HMO

## 2020-06-23 ENCOUNTER — Ambulatory Visit: Payer: Medicare HMO

## 2020-06-23 ENCOUNTER — Ambulatory Visit: Payer: Medicare HMO | Admitting: Hematology and Oncology

## 2020-07-01 DIAGNOSIS — R69 Illness, unspecified: Secondary | ICD-10-CM | POA: Diagnosis not present

## 2020-07-12 DIAGNOSIS — R69 Illness, unspecified: Secondary | ICD-10-CM | POA: Diagnosis not present

## 2020-07-14 ENCOUNTER — Other Ambulatory Visit: Payer: Self-pay

## 2020-07-14 ENCOUNTER — Inpatient Hospital Stay: Payer: Medicare HMO | Attending: Hematology and Oncology

## 2020-07-14 VITALS — BP 139/57 | HR 68 | Temp 97.8°F | Resp 18

## 2020-07-14 DIAGNOSIS — C50512 Malignant neoplasm of lower-outer quadrant of left female breast: Secondary | ICD-10-CM

## 2020-07-14 DIAGNOSIS — Z5112 Encounter for antineoplastic immunotherapy: Secondary | ICD-10-CM | POA: Insufficient documentation

## 2020-07-14 DIAGNOSIS — Z170 Estrogen receptor positive status [ER+]: Secondary | ICD-10-CM | POA: Insufficient documentation

## 2020-07-14 DIAGNOSIS — Z79811 Long term (current) use of aromatase inhibitors: Secondary | ICD-10-CM | POA: Diagnosis not present

## 2020-07-14 DIAGNOSIS — Z9221 Personal history of antineoplastic chemotherapy: Secondary | ICD-10-CM | POA: Diagnosis not present

## 2020-07-14 MED ORDER — DIPHENHYDRAMINE HCL 25 MG PO CAPS
25.0000 mg | ORAL_CAPSULE | Freq: Once | ORAL | Status: AC
  Administered 2020-07-14: 25 mg via ORAL

## 2020-07-14 MED ORDER — DIPHENHYDRAMINE HCL 25 MG PO CAPS
ORAL_CAPSULE | ORAL | Status: AC
  Filled 2020-07-14: qty 1

## 2020-07-14 MED ORDER — ACETAMINOPHEN 325 MG PO TABS
ORAL_TABLET | ORAL | Status: AC
  Filled 2020-07-14: qty 2

## 2020-07-14 MED ORDER — SODIUM CHLORIDE 0.9 % IV SOLN
Freq: Once | INTRAVENOUS | Status: AC
  Filled 2020-07-14: qty 250

## 2020-07-14 MED ORDER — ACETAMINOPHEN 325 MG PO TABS
650.0000 mg | ORAL_TABLET | Freq: Once | ORAL | Status: AC
  Administered 2020-07-14: 650 mg via ORAL

## 2020-07-14 MED ORDER — SODIUM CHLORIDE 0.9% FLUSH
10.0000 mL | INTRAVENOUS | Status: DC | PRN
  Administered 2020-07-14: 10 mL
  Filled 2020-07-14: qty 10

## 2020-07-14 MED ORDER — HEPARIN SOD (PORK) LOCK FLUSH 100 UNIT/ML IV SOLN
500.0000 [IU] | Freq: Once | INTRAVENOUS | Status: AC | PRN
  Administered 2020-07-14: 500 [IU]
  Filled 2020-07-14: qty 5

## 2020-07-14 MED ORDER — TRASTUZUMAB-ANNS CHEMO 150 MG IV SOLR
300.0000 mg | Freq: Once | INTRAVENOUS | Status: AC
  Administered 2020-07-14: 300 mg via INTRAVENOUS
  Filled 2020-07-14: qty 14.29

## 2020-07-14 NOTE — Patient Instructions (Signed)
[facility] Discharge Instructions for Patients Receiving Chemotherapy  Today you received the following chemotherapy agent: Trastuzumab  To help prevent nausea and vomiting after your treatment, we encourage you to take your nausea medication  as prescribed.    If you develop nausea and vomiting that is not controlled by your nausea medication, call the clinic.   BELOW ARE SYMPTOMS THAT SHOULD BE REPORTED IMMEDIATELY:  *FEVER GREATER THAN 100.5 F  *CHILLS WITH OR WITHOUT FEVER  NAUSEA AND VOMITING THAT IS NOT CONTROLLED WITH YOUR NAUSEA MEDICATION  *UNUSUAL SHORTNESS OF BREATH  *UNUSUAL BRUISING OR BLEEDING  TENDERNESS IN MOUTH AND THROAT WITH OR WITHOUT PRESENCE OF ULCERS  *URINARY PROBLEMS  *BOWEL PROBLEMS  UNUSUAL RASH Items with * indicate a potential emergency and should be followed up as soon as possible.  Feel free to call the clinic should you have any questions or concerns. The clinic phone number is [phone].  Please show the [place] at check-in to the Emergency Department and triage nurse.

## 2020-07-19 ENCOUNTER — Ambulatory Visit
Admission: RE | Admit: 2020-07-19 | Discharge: 2020-07-19 | Disposition: A | Discharge status: Home / Self Care | Payer: Medicare HMO | Source: Ambulatory Visit | Attending: Radiation Oncology | Admitting: Radiation Oncology

## 2020-07-19 ENCOUNTER — Other Ambulatory Visit: Payer: Self-pay

## 2020-07-19 ENCOUNTER — Encounter: Payer: Self-pay | Admitting: Radiation Oncology

## 2020-07-19 DIAGNOSIS — Z9889 Other specified postprocedural states: Secondary | ICD-10-CM | POA: Diagnosis not present

## 2020-07-19 DIAGNOSIS — C50512 Malignant neoplasm of lower-outer quadrant of left female breast: Secondary | ICD-10-CM

## 2020-07-19 DIAGNOSIS — Z170 Estrogen receptor positive status [ER+]: Secondary | ICD-10-CM

## 2020-07-19 NOTE — Progress Notes (Signed)
Radiation Oncology         [phone] ________________________________  Outpatient Follow Up- Conducted via telephone due to current COVID-19 concerns for limiting patient exposure  I spoke with the patient to conduct this consult visit via telephone to spare the patient unnecessary potential exposure in the healthcare setting during the current COVID-19 pandemic. The patient was notified in advance and was offered a [place] meeting to allow for face to face communication but unfortunately reported that they did not have the appropriate resources/technology to support such a visit and instead preferred to proceed with a telephone visit.  Name: Rachel Valentine        MRN: [id number]  Date of Service: 07/19/2020 DOB: [birth year]  [id number], [name], MD  Nicholas Lose, MD     REFERRING PHYSICIAN: Nicholas Lose, MD   DIAGNOSIS: The encounter diagnosis was Malignant neoplasm of lower-outer quadrant of left breast of female, estrogen receptor positive ([place]).   HISTORY OF PRESENT ILLNESS: Rachel Valentine is a 79 y.o. female seen in the multidisciplinary breast clinic for a new diagnosis of left breast cancer. The patient was noted to have a palpable mass in the left breast, which prompted further diagnostic imaging, she underwent these images and calcifications of a mass at 5:00 in the left breast was noted measuring 3.3 cm.  She underwent biopsy on 01/18/2020 which revealed an invasive ductal carcinoma that was grade 3, the tumor was ER positive, PR negative and HER-2 amplified.  Her Ki-67 was 50%.  She met with Dr. Brantley Stage and with Dr. Lindi Adie. She proceeded with neoadjuvant chemotherapy with Taxol and Herceptin. She has done well with this and has healed from lumpectomy surgery on 06/15/20 that showed complete pathologic response and 0/4 nodes. She is contacted today by telephone to discuss options of adjuvant therapy.   PREVIOUS RADIATION THERAPY: No   PAST MEDICAL  HISTORY:  Past Medical History:  Diagnosis Date  . Anemia    during chemo  . Bowel obstruction ([place])   . [facility])    dx of breast cancer 2021 - per pt in PAT 02/09/20  . Chronic kidney disease    blood clot in right kidney after surgery in June, 2021  . Coronary artery disease   . GERD (gastroesophageal reflux disease)   . Hypertension   . Pneumonia   . PONV (postoperative nausea and vomiting)        PAST SURGICAL HISTORY: Past Surgical History:  Procedure Laterality Date  . ABDOMINAL HYSTERECTOMY    . ABDOMINAL SURGERY    . APPENDECTOMY    . AUGMENTATION MAMMAPLASTY Bilateral    implants removed in 2005  . BREAST EXCISIONAL BIOPSY Left    benign  . BREAST LUMPECTOMY WITH RADIOACTIVE SEED AND SENTINEL LYMPH NODE BIOPSY Left 06/15/2020   Procedure: LEFT BREAST LUMPECTOMYX 2 WITH RADIOACTIVE SEED AND SENTINEL LYMPH NODE BIOPSY;  Surgeon: [name], MD;  Location: [place];  Service: General;  Laterality: Left;  PECTORAL BLOCK  . CORONARY ARTERY BYPASS GRAFT N/A 06/28/2019   Procedure: CORONARY ARTERY BYPASS GRAFTING (CABG) using endoscopic harvest right saphenous vein to the posterior lateral (PLB).;  Surgeon: Gaye Pollack, MD;  Location: [place] OR;  Service: Open Heart Surgery;  Laterality: N/A;  . CORONARY ATHERECTOMY N/A 01/05/2019   Procedure: CORONARY ATHERECTOMY - STENT;  Surgeon: Burnell Blanks, MD;  Location: [place] CV LAB;  Service: Cardiovascular;  Laterality: N/A;  . CORONARY STENT INTERVENTION N/A 01/04/2019   Procedure: CORONARY STENT  INTERVENTION;  Surgeon: Leonie Man, MD;  Location: [place] CV LAB;  Service: Cardiovascular;  Laterality: N/A;  . LEFT HEART CATH AND CORONARY ANGIOGRAPHY N/A 01/04/2019   Procedure: LEFT HEART CATH AND CORONARY ANGIOGRAPHY;  Surgeon: Leonie Man, MD;  Location: [place] CV LAB;  Service: Cardiovascular;  Laterality: N/A;  . LEFT HEART CATH AND CORONARY ANGIOGRAPHY N/A 06/24/2019   Procedure: LEFT HEART  CATH AND CORONARY ANGIOGRAPHY;  Surgeon: [name], MD;  Location: [place] CV LAB;  Service: Cardiovascular;  Laterality: N/A;  . PORTACATH PLACEMENT Right 02/15/2020   Procedure: INSERTION PORT-A-CATH WITH ULTRASOUND GUIDANCE;  Surgeon: [name], MD;  Location: [place];  Service: General;  Laterality: Right;  . TEE WITHOUT CARDIOVERSION N/A 06/28/2019   Procedure: TRANSESOPHAGEAL ECHOCARDIOGRAM (TEE);  Surgeon: Gaye Pollack, MD;  Location: [place];  Service: Open Heart Surgery;  Laterality: N/A;  . TEMPORARY PACEMAKER N/A 01/05/2019   Procedure: TEMPORARY PACEMAKER;  Surgeon: Burnell Blanks, MD;  Location: [place] CV LAB;  Service: Cardiovascular;  Laterality: N/A;  . TONSILLECTOMY       FAMILY HISTORY:  Family History  Problem Relation Age of Onset  . Heart failure Mother   . Stroke Father   . Stroke Brother      SOCIAL HISTORY:  reports that she quit smoking about 21 years ago. She has never used smokeless tobacco. She reports that she does not drink alcohol and does not use drugs. The patient is widowed and lives in [place].    ALLERGIES: Paclitaxel, Fosamax [alendronate], Prednisone, Clindamycin/lincomycin, and Penicillins   MEDICATIONS:  Current Outpatient Medications  Medication Sig Dispense Refill  . Biotin 10000 MCG TABS Take 10,000 mg by mouth daily.     . calcium citrate (CALCITRATE - DOSED IN MG ELEMENTAL CALCIUM) 950 (200 Ca) MG tablet Take 200 mg of elemental calcium by mouth 2 (two) times daily.     . Calcium-Phosphorus-Vitamin D (CITRACAL +D3 PO) Take 1 tablet by mouth in the morning and at bedtime.     . diphenhydrAMINE (BENADRYL) 25 mg capsule Take 25 mg by mouth at bedtime as needed for allergies.     Marland Kitchen ipratropium (ATROVENT) 0.03 % nasal spray Place 2 sprays into both nostrils daily.    . metoprolol succinate (TOPROL-XL) 25 MG 24 hr tablet Take 25 mg by mouth daily.    . pantoprazole (PROTONIX) 40 MG tablet TAKE 1 TABLET BY MOUTH  EVERY DAY (Patient taking differently: Take 40 mg by mouth daily. ) 90 tablet 2  . Propylene Glycol (SYSTANE BALANCE) 0.6 % SOLN Place 2 drops into both eyes daily as needed (Dry eyes).     . rosuvastatin (CRESTOR) 40 MG tablet Take 40 mg by mouth daily.     Marland Kitchen HYDROcodone-acetaminophen (NORCO/VICODIN) 5-325 MG tablet Take 1 tablet by mouth every 6 (six) hours as needed for moderate pain. (Patient not taking: Reported on 07/19/2020) 15 tablet 0  . nitroGLYCERIN (NITROSTAT) 0.4 MG SL tablet Place 1 tablet (0.4 mg total) under the tongue every 5 (five) minutes as needed for chest pain. (Patient not taking: Reported on 07/19/2020) 30 tablet 0  . prochlorperazine (COMPAZINE) 10 MG tablet Take 10 mg by mouth every 6 (six) hours as needed for nausea or vomiting. (Patient not taking: Reported on 07/19/2020)     No current facility-administered medications for this encounter.     REVIEW OF SYSTEMS: On review of systems, the patient reports that she is doing well overall. She  is not having any issues now with surgery but had some fullness and fluid that was aspirated from the breast. She reports she did very well with chemotherapy. No complaints are verbalized.    PHYSICAL EXAM:  Unable to assess due to encounter type.   ECOG = 0  0 - Asymptomatic (Fully active, able to carry on all predisease activities without restriction)  1 - Symptomatic but completely ambulatory (Restricted in physically strenuous activity but ambulatory and able to carry out work of a light or sedentary nature. For example, light housework, office work)  2 - Symptomatic, <50% in bed during the day (Ambulatory and capable of all self care but unable to carry out any work activities. Up and about more than 50% of waking hours)  3 - Symptomatic, >50% in bed, but not bedbound (Capable of only limited self-care, confined to bed or chair 50% or more of waking hours)  4 - Bedbound (Completely disabled. Cannot carry on any self-care.  Totally confined to bed or chair)  5 - Death   Eustace Pen MM, Creech RH, Tormey DC, et al. [phone]). "Toxicity and response criteria of the [facility]". [place] Oncol. 5 (6): 649-55    LABORATORY DATA:  Lab Results  Component Value Date   WBC 7.2 06/21/2020   HGB 10.7 (L) 06/21/2020   HCT 34.5 (L) 06/21/2020   MCV 89.6 06/21/2020   PLT 323 06/21/2020   Lab Results  Component Value Date   NA 139 06/21/2020   K 4.4 06/21/2020   CL 105 06/21/2020   CO2 27 06/21/2020   Lab Results  Component Value Date   ALT 7 06/21/2020   AST 17 06/21/2020   ALKPHOS 84 06/21/2020   BILITOT 0.3 06/21/2020      RADIOGRAPHY: No results found.     IMPRESSION/PLAN: 1.            Stage IIA, cT2N0M0 grade 3, ER positive, HER2 amplified invasive ductal carcinoma of the left breast with a complete pathologic response. Dr. Lisbeth Renshaw reviews the findings and nature of left breast disease and reviews the rationale for adjuvant radiotherapy to the breast to reduce the risks of local recurrence. She will continue this while completing her course of Herceptin. He discusses the risks, benefits, short, and long term effects of radiotherapy. Dr. Lisbeth Renshaw also discusses the delivery and logistics of treatment, and recommends a course of 4  weeks of radiotherapy. She will come in for simulation tomorrow at which time we will sign written consent to proceed. We will plan to start on 07/24/20 so she can finish radiation before Christmas as she wants to be out of town at that time with family.   Given current concerns for patient exposure during the COVID-19 pandemic, this encounter was conducted via telephone.  The patient has provided two factor identification and has given verbal consent for this type of encounter and has been advised to only accept a meeting of this type in a secure network environment. The time spent during this encounter was 45 minutes including preparation, discussion, and  coordination of the patient's care. The attendants for this meeting include Dr. Lisbeth Renshaw, Hayden Pedro  and [place].  During the encounter, Dr. Lisbeth Renshaw, and Hayden Pedro were located at [facility] Radiation Oncology Department.  Sandrine Bloodsworth Quinley was located at home.    The above documentation reflects my direct findings during this shared patient visit. Please see the separate note by  Dr. Lisbeth Renshaw on this date for the remainder of the patient's plan of care.    Carola Rhine, PAC

## 2020-07-20 ENCOUNTER — Ambulatory Visit
Admission: RE | Admit: 2020-07-20 | Discharge: 2020-07-20 | Disposition: A | Discharge status: Home / Self Care | Payer: Medicare HMO | Source: Ambulatory Visit | Attending: Radiation Oncology | Admitting: Radiation Oncology

## 2020-07-20 ENCOUNTER — Other Ambulatory Visit: Payer: Self-pay

## 2020-07-20 DIAGNOSIS — Z170 Estrogen receptor positive status [ER+]: Secondary | ICD-10-CM | POA: Insufficient documentation

## 2020-07-20 DIAGNOSIS — Z510 Encounter for antineoplastic radiation therapy: Secondary | ICD-10-CM | POA: Insufficient documentation

## 2020-07-20 DIAGNOSIS — C50512 Malignant neoplasm of lower-outer quadrant of left female breast: Secondary | ICD-10-CM | POA: Insufficient documentation

## 2020-07-20 DIAGNOSIS — L600 Ingrowing nail: Secondary | ICD-10-CM | POA: Diagnosis not present

## 2020-07-21 DIAGNOSIS — Z170 Estrogen receptor positive status [ER+]: Secondary | ICD-10-CM | POA: Diagnosis not present

## 2020-07-21 DIAGNOSIS — C50512 Malignant neoplasm of lower-outer quadrant of left female breast: Secondary | ICD-10-CM | POA: Diagnosis not present

## 2020-07-21 DIAGNOSIS — Z510 Encounter for antineoplastic radiation therapy: Secondary | ICD-10-CM | POA: Diagnosis not present

## 2020-07-23 ENCOUNTER — Other Ambulatory Visit: Payer: Self-pay

## 2020-07-23 ENCOUNTER — Ambulatory Visit
Admission: RE | Admit: 2020-07-23 | Discharge: 2020-07-23 | Disposition: A | Discharge status: Home / Self Care | Payer: Medicare HMO | Source: Ambulatory Visit | Attending: Radiation Oncology | Admitting: Radiation Oncology

## 2020-07-23 DIAGNOSIS — C50512 Malignant neoplasm of lower-outer quadrant of left female breast: Secondary | ICD-10-CM | POA: Diagnosis not present

## 2020-07-23 DIAGNOSIS — Z510 Encounter for antineoplastic radiation therapy: Secondary | ICD-10-CM | POA: Diagnosis not present

## 2020-07-23 DIAGNOSIS — Z170 Estrogen receptor positive status [ER+]: Secondary | ICD-10-CM | POA: Diagnosis not present

## 2020-07-24 ENCOUNTER — Ambulatory Visit
Admission: RE | Admit: 2020-07-24 | Discharge: 2020-07-24 | Disposition: A | Discharge status: Home / Self Care | Payer: Medicare HMO | Source: Ambulatory Visit | Attending: Radiation Oncology | Admitting: Radiation Oncology

## 2020-07-24 ENCOUNTER — Encounter: Payer: Self-pay | Admitting: *Deleted

## 2020-07-24 DIAGNOSIS — Z170 Estrogen receptor positive status [ER+]: Secondary | ICD-10-CM | POA: Diagnosis not present

## 2020-07-24 DIAGNOSIS — C50512 Malignant neoplasm of lower-outer quadrant of left female breast: Secondary | ICD-10-CM | POA: Diagnosis not present

## 2020-07-24 DIAGNOSIS — Z510 Encounter for antineoplastic radiation therapy: Secondary | ICD-10-CM | POA: Diagnosis not present

## 2020-07-25 ENCOUNTER — Ambulatory Visit
Admission: RE | Admit: 2020-07-25 | Discharge: 2020-07-25 | Disposition: A | Discharge status: Home / Self Care | Payer: Medicare HMO | Source: Ambulatory Visit | Attending: Radiation Oncology | Admitting: Radiation Oncology

## 2020-07-25 ENCOUNTER — Other Ambulatory Visit: Payer: Self-pay

## 2020-07-25 DIAGNOSIS — Z170 Estrogen receptor positive status [ER+]: Secondary | ICD-10-CM | POA: Diagnosis not present

## 2020-07-25 DIAGNOSIS — C50512 Malignant neoplasm of lower-outer quadrant of left female breast: Secondary | ICD-10-CM | POA: Diagnosis not present

## 2020-07-25 DIAGNOSIS — Z510 Encounter for antineoplastic radiation therapy: Secondary | ICD-10-CM | POA: Diagnosis not present

## 2020-07-26 ENCOUNTER — Ambulatory Visit: Payer: Medicare HMO

## 2020-07-31 ENCOUNTER — Ambulatory Visit
Admission: RE | Admit: 2020-07-31 | Discharge: 2020-07-31 | Disposition: A | Discharge status: Home / Self Care | Payer: Medicare HMO | Source: Ambulatory Visit | Attending: Radiation Oncology | Admitting: Radiation Oncology

## 2020-07-31 ENCOUNTER — Other Ambulatory Visit: Payer: Self-pay | Admitting: *Deleted

## 2020-07-31 DIAGNOSIS — C50512 Malignant neoplasm of lower-outer quadrant of left female breast: Secondary | ICD-10-CM | POA: Diagnosis not present

## 2020-07-31 DIAGNOSIS — Z510 Encounter for antineoplastic radiation therapy: Secondary | ICD-10-CM | POA: Diagnosis not present

## 2020-07-31 DIAGNOSIS — Z006 Encounter for examination for normal comparison and control in clinical research program: Secondary | ICD-10-CM

## 2020-07-31 DIAGNOSIS — Z170 Estrogen receptor positive status [ER+]: Secondary | ICD-10-CM | POA: Diagnosis not present

## 2020-08-01 ENCOUNTER — Ambulatory Visit
Admission: RE | Admit: 2020-08-01 | Discharge: 2020-08-01 | Disposition: A | Discharge status: Home / Self Care | Payer: Medicare HMO | Source: Ambulatory Visit | Attending: Radiation Oncology | Admitting: Radiation Oncology

## 2020-08-01 ENCOUNTER — Other Ambulatory Visit: Payer: Self-pay

## 2020-08-01 DIAGNOSIS — C50512 Malignant neoplasm of lower-outer quadrant of left female breast: Secondary | ICD-10-CM | POA: Diagnosis not present

## 2020-08-01 DIAGNOSIS — Z510 Encounter for antineoplastic radiation therapy: Secondary | ICD-10-CM | POA: Diagnosis not present

## 2020-08-01 DIAGNOSIS — Z170 Estrogen receptor positive status [ER+]: Secondary | ICD-10-CM | POA: Diagnosis not present

## 2020-08-02 ENCOUNTER — Ambulatory Visit
Admission: RE | Admit: 2020-08-02 | Discharge: 2020-08-02 | Disposition: A | Discharge status: Home / Self Care | Payer: Medicare HMO | Source: Ambulatory Visit | Attending: Radiation Oncology | Admitting: Radiation Oncology

## 2020-08-02 DIAGNOSIS — Z170 Estrogen receptor positive status [ER+]: Secondary | ICD-10-CM | POA: Diagnosis not present

## 2020-08-02 DIAGNOSIS — C50512 Malignant neoplasm of lower-outer quadrant of left female breast: Secondary | ICD-10-CM | POA: Diagnosis not present

## 2020-08-02 MED ORDER — ALRA NON-METALLIC DEODORANT (RAD-ONC)
1.0000 "application " | Freq: Once | TOPICAL | Status: AC
  Administered 2020-08-02: 1 via TOPICAL

## 2020-08-02 MED ORDER — RADIAPLEXRX EX GEL: Freq: Once | CUTANEOUS | Status: AC

## 2020-08-02 NOTE — Progress Notes (Signed)
Pt here for patient teaching.  Pt given Radiation and You booklet, skin care instructions, Alra deodorant and Radiaplex gel.  Reviewed areas of pertinence such as fatigue, hair loss, skin changes, breast tenderness and breast swelling . Pt able to give teach back of to pat skin and use unscented/gentle soap,apply Radiaplex bid, avoid applying anything to skin within 4 hours of treatment, avoid wearing an under wire bra and to use an electric razor if they must shave. Pt verbalizes understanding of information given and will contact nursing with any questions or concerns.     Taja Pentland M. Whitnie Deleon RN, BSN      

## 2020-08-03 ENCOUNTER — Ambulatory Visit
Admission: RE | Admit: 2020-08-03 | Discharge: 2020-08-03 | Disposition: A | Discharge status: Home / Self Care | Payer: Medicare HMO | Source: Ambulatory Visit | Attending: Radiation Oncology | Admitting: Radiation Oncology

## 2020-08-03 DIAGNOSIS — C50512 Malignant neoplasm of lower-outer quadrant of left female breast: Secondary | ICD-10-CM | POA: Diagnosis not present

## 2020-08-03 DIAGNOSIS — Z170 Estrogen receptor positive status [ER+]: Secondary | ICD-10-CM | POA: Diagnosis not present

## 2020-08-03 NOTE — Assessment & Plan Note (Signed)
01/18/2020:Palpable left breast lump with calcifications at 5 o'clock position measuring 3.3 cm. Biopsy revealed grade 3 IDC ER 95%, PR 0%, HER-2 3+ positive, Ki-67 50%, axilla negative, T2N0 stage IIa clinical stage Bilateral implants removed 2005  Treatment plan: 1.Neo- adjuvant chemotherapy with Taxol Herceptin 2.Breast conserving surgery with sentinel lymph node biopsy 3.Adjuvant radiation therapy followed by 4.Adjuvant antiestrogen therapywith anastrozole 1 mg daily x5 years  Breast MRI: 2.3 cm mass, indeterminate 8 mm mass (Biopsy: ADH), no LN --------------------------------------------------------------------------------------------------------------------------------------------------------------- 06/15/20: Left lumpectomy ([facility]): no residual carcinoma, 4 left axillary lymph nodes negative for carcinoma  Plan: 1. Anti-HER-2 therapy  2. adjuvant radiation therapy followed by 3.Adjuvant antiestrogen therapywith anastrozole 1 mg daily x5 years   Every 3-week Herceptin will be continued. I will see the patient every 6 weeks.  

## 2020-08-03 NOTE — Progress Notes (Signed)
Patient Care Team: [name], MD as PCP - General (Family Medicine) [name], MD as PCP - Cardiology (Cardiology) [name], MD as Surgeon (General Surgery) [name], RN as Oncology Nurse Navigator Mauro Kaufmann, RN as Oncology Nurse Navigator  DIAGNOSIS:    ICD-10-CM   1. Malignant neoplasm of lower-outer quadrant of left breast of female, estrogen receptor positive ([place])  C50.512    Z17.0     SUMMARY OF ONCOLOGIC HISTORY: Oncology History  Malignant neoplasm of lower-outer quadrant of left breast of female, estrogen receptor positive ([place])  01/18/2020 Initial Diagnosis   Palpable left breast lump with calcifications at 5 o'clock position measuring 3.3 cm.  Biopsy revealed grade 3 IDC ER 95%, PR 0%, HER-2 3+ positive, Ki-67 50%, axilla negative, T2N0   01/26/2020 Cancer Staging   Staging form: Breast, AJCC 8th Edition - Clinical stage from 01/26/2020: Stage IIA (cT2, cN0, cM0, G3, ER+, PR-, HER2+) - Signed by Nicholas Lose, MD on 01/26/2020   02/16/2020 - 05/13/2020 Chemotherapy   The patient had PACLitaxel-protein bound (ABRAXANE) chemo infusion 125 mg, 80 mg/m2 = 125 mg (100 % of original dose 80 mg/m2), Intravenous,  Once, 3 of 3 cycles Dose modification: 80 mg/m2 (original dose 80 mg/m2, Cycle 2) Administration: 125 mg (03/22/2020), 125 mg (03/29/2020), 125 mg (04/06/2020), 125 mg (04/12/2020), 125 mg (04/20/2020), 125 mg (04/26/2020), 125 mg (05/05/2020), 125 mg (05/13/2020) PACLitaxel (TAXOL) 120 mg in sodium chloride 0.9 % 250 mL chemo infusion (</= 29m/m2), 80 mg/m2 = 120 mg, Intravenous,  Once, 1 of 1 cycle Administration: 120 mg (02/16/2020), 120 mg (02/23/2020), 120 mg (03/01/2020), 120 mg (03/08/2020) trastuzumab-anns (KANJINTI) 189 mg in sodium chloride 0.9 % 250 mL chemo infusion, 4 mg/kg = 189 mg, Intravenous,  Once, 3 of 3 cycles Dose modification: 6 mg/kg (original dose 2 mg/kg, Cycle 3, Reason: Other (see comments), Comment: maintenance  Trastuzumab to start) Administration: 189 mg (02/16/2020), 105 mg (02/23/2020), 105 mg (03/01/2020), 105 mg (03/08/2020), 105 mg (03/15/2020), 105 mg (03/22/2020), 105 mg (03/29/2020), 105 mg (04/06/2020), 105 mg (04/12/2020), 105 mg (04/20/2020), 105 mg (04/26/2020), 300 mg (05/05/2020)  for chemotherapy treatment.    05/31/2020 -  Chemotherapy   The patient had trastuzumab-anns (KANJINTI) 300 mg in sodium chloride 0.9 % 250 mL chemo infusion, 300 mg, Intravenous,  Once, 3 of 13 cycles Dose modification: 6 mg/kg (original dose 6 mg/kg, Cycle 1, Reason: Other (see comments), Comment: biosimilar) Administration: 300 mg (06/21/2020), 300 mg (05/31/2020)  for chemotherapy treatment.    06/15/2020 Surgery   Left lumpectomy ([facility]): no residual carcinoma, 4 left axillary lymph nodes negative for carcinoma   07/24/2020 -  Radiation Therapy   Adjuvant radiation     CHIEF COMPLIANT: Follow-up of left breast cancer to discuss antiestrogen therapy, currently on Herceptin  INTERVAL HISTORY: Rachel Valentine a 79y.o. with above-mentioned history of left breastcancer who completed neoadjuvant chemotherapy, underwent a left lumpectomy, and is currently on radiation. She presents to the clinic today to discuss antiestrogen therapy.  She is tolerating the Herceptin extremely well without any problems or concerns.  She is going through radiation and has 2 more weeks left.  She needs a urine analysis done today.  ALLERGIES:  is allergic to paclitaxel, fosamax [alendronate], prednisone, clindamycin/lincomycin, and penicillins.  MEDICATIONS:  Current Outpatient Medications  Medication Sig Dispense Refill  . Biotin 10000 MCG TABS Take 10,000 mg by mouth daily.     . calcium citrate (CALCITRATE - DOSED IN  MG ELEMENTAL CALCIUM) 950 (200 Ca) MG tablet Take 200 mg of elemental calcium by mouth 2 (two) times daily.     . Calcium-Phosphorus-Vitamin D (CITRACAL +D3 PO) Take 1 tablet by mouth in the morning and at  bedtime.     . diphenhydrAMINE (BENADRYL) 25 mg capsule Take 25 mg by mouth at bedtime as needed for allergies.     Marland Kitchen HYDROcodone-acetaminophen (NORCO/VICODIN) 5-325 MG tablet Take 1 tablet by mouth every 6 (six) hours as needed for moderate pain. (Patient not taking: Reported on 07/19/2020) 15 tablet 0  . ipratropium (ATROVENT) 0.03 % nasal spray Place 2 sprays into both nostrils daily.    . metoprolol succinate (TOPROL-XL) 25 MG 24 hr tablet Take 25 mg by mouth daily.    . nitroGLYCERIN (NITROSTAT) 0.4 MG SL tablet Place 1 tablet (0.4 mg total) under the tongue every 5 (five) minutes as needed for chest pain. (Patient not taking: Reported on 07/19/2020) 30 tablet 0  . pantoprazole (PROTONIX) 40 MG tablet TAKE 1 TABLET BY MOUTH EVERY DAY (Patient taking differently: Take 40 mg by mouth daily. ) 90 tablet 2  . prochlorperazine (COMPAZINE) 10 MG tablet Take 10 mg by mouth every 6 (six) hours as needed for nausea or vomiting. (Patient not taking: Reported on 07/19/2020)    . Propylene Glycol (SYSTANE BALANCE) 0.6 % SOLN Place 2 drops into both eyes daily as needed (Dry eyes).     . rosuvastatin (CRESTOR) 40 MG tablet Take 40 mg by mouth daily.      No current facility-administered medications for this visit.    PHYSICAL EXAMINATION: ECOG PERFORMANCE STATUS: 1 - Symptomatic but completely ambulatory  Vitals:   08/04/20 1119  BP: (!) 137/55  Pulse: 79  Resp: 18  SpO2: 93%   Filed Weights   08/04/20 1119  Weight: 101 lb 6.4 oz (46 kg)    LABORATORY DATA:  I have reviewed the data as listed CMP Latest Ref Rng & Units 06/21/2020 06/09/2020 05/10/2020  Glucose 70 - 99 mg/dL 117(H) 96 123(H)  BUN 8 - 23 mg/dL 16 16 19   Creatinine 0.44 - 1.00 mg/dL 0.79 0.70 0.75  Sodium 135 - 145 mmol/L 139 139 140  Potassium 3.5 - 5.1 mmol/L 4.4 4.4 4.2  Chloride 98 - 111 mmol/L 105 104 107  CO2 22 - 32 mmol/L 27 25 27   Calcium 8.9 - 10.3 mg/dL 9.4 9.7 9.2  Total Protein 6.5 - 8.1 g/dL 6.5 6.8 6.5   Total Bilirubin 0.3 - 1.2 mg/dL 0.3 0.2(L) 0.4  Alkaline Phos 38 - 126 U/L 84 79 72  AST 15 - 41 U/L 17 16 20   ALT 0 - 44 U/L 7 10 8     Lab Results  Component Value Date   WBC 7.2 06/21/2020   HGB 10.7 (L) 06/21/2020   HCT 34.5 (L) 06/21/2020   MCV 89.6 06/21/2020   PLT 323 06/21/2020   NEUTROABS 4.4 06/21/2020    ASSESSMENT & PLAN:  Malignant neoplasm of lower-outer quadrant of left breast of female, estrogen receptor positive ([place]) 01/18/2020:Palpable left breast lump with calcifications at 5 o'clock position measuring 3.3 cm. Biopsy revealed grade 3 IDC ER 95%, PR 0%, HER-2 3+ positive, Ki-67 50%, axilla negative, T2N0 stage IIa clinical stage Bilateral implants removed 2005  Treatment plan: 1.Neo- adjuvant chemotherapy with Taxol Herceptin 2.Breast conserving surgery with sentinel lymph node biopsy 3.Adjuvant radiation therapy followed by 4.Adjuvant antiestrogen therapywith anastrozole 1 mg daily x5 years  Breast MRI: 2.3 cm mass,  indeterminate 8 mm mass (Biopsy: ADH), no LN --------------------------------------------------------------------------------------------------------------------------------------------------------------- 06/15/20: Left lumpectomy ([facility]): no residual carcinoma, 4 left axillary lymph nodes negative for carcinoma  Plan: 1. Anti-HER-2 therapy  2. adjuvant radiation therapy followed by 3.Adjuvant antiestrogen therapywith anastrozole 1 mg daily x5 years (this will start January 2022) Anastrozole counseling:We discussed the risks and benefits of anti-estrogen therapy with aromatase inhibitors. These include but not limited to insomnia, hot flashes, mood changes, vaginal dryness, bone density loss, and weight gain. We strongly believe that the benefits far outweigh the risks. Patient understands these risks and consented to starting treatment. Planned treatment duration is 7 years.  Osteoporosis: In 2018 at bone density was T score  -2.6: I recommended that she receive Prolia injections every 6 months.  We will added to her next treatment.   Every 3-week Herceptin will be continued. I will see the patient every 6 weeks.     No orders of the defined types were placed in this encounter.  The patient has a good understanding of the overall plan. she agrees with it. she will call with any problems that may develop before the next visit here.  Total time spent: 30 mins including face to face time and time spent for planning, charting and coordination of care  Nicholas Lose, MD 08/04/2020  I, Cloyde Reams Dorshimer, am acting as scribe for Dr. Nicholas Lose.  I have reviewed the above documentation for accuracy and completeness, and I agree with the above.

## 2020-08-04 ENCOUNTER — Inpatient Hospital Stay: Payer: Medicare HMO | Admitting: *Deleted

## 2020-08-04 ENCOUNTER — Inpatient Hospital Stay: Payer: Medicare HMO

## 2020-08-04 ENCOUNTER — Inpatient Hospital Stay (HOSPITAL_BASED_OUTPATIENT_CLINIC_OR_DEPARTMENT_OTHER): Payer: Medicare HMO | Admitting: Hematology and Oncology

## 2020-08-04 ENCOUNTER — Other Ambulatory Visit: Payer: Self-pay

## 2020-08-04 ENCOUNTER — Ambulatory Visit
Admission: RE | Admit: 2020-08-04 | Discharge: 2020-08-04 | Disposition: A | Discharge status: Home / Self Care | Payer: Medicare HMO | Source: Ambulatory Visit | Attending: Radiation Oncology | Admitting: Radiation Oncology

## 2020-08-04 ENCOUNTER — Inpatient Hospital Stay: Payer: Medicare HMO | Attending: Hematology and Oncology

## 2020-08-04 VITALS — BP 137/55 | HR 79 | Resp 18 | Ht 62.0 in | Wt 101.4 lb

## 2020-08-04 DIAGNOSIS — Z79899 Other long term (current) drug therapy: Secondary | ICD-10-CM | POA: Insufficient documentation

## 2020-08-04 DIAGNOSIS — Z5112 Encounter for antineoplastic immunotherapy: Secondary | ICD-10-CM | POA: Diagnosis present

## 2020-08-04 DIAGNOSIS — Z170 Estrogen receptor positive status [ER+]: Secondary | ICD-10-CM | POA: Insufficient documentation

## 2020-08-04 DIAGNOSIS — R319 Hematuria, unspecified: Secondary | ICD-10-CM

## 2020-08-04 DIAGNOSIS — Z9221 Personal history of antineoplastic chemotherapy: Secondary | ICD-10-CM | POA: Diagnosis not present

## 2020-08-04 DIAGNOSIS — C50512 Malignant neoplasm of lower-outer quadrant of left female breast: Secondary | ICD-10-CM | POA: Diagnosis not present

## 2020-08-04 DIAGNOSIS — Z006 Encounter for examination for normal comparison and control in clinical research program: Secondary | ICD-10-CM

## 2020-08-04 DIAGNOSIS — Z95828 Presence of other vascular implants and grafts: Secondary | ICD-10-CM

## 2020-08-04 LAB — URINALYSIS, COMPLETE (UACMP) WITH MICROSCOPIC
Bacteria, UA: NONE SEEN
Bilirubin Urine: NEGATIVE
Glucose, UA: NEGATIVE mg/dL
Hgb urine dipstick: NEGATIVE
Ketones, ur: NEGATIVE mg/dL
Leukocytes,Ua: NEGATIVE
Nitrite: NEGATIVE
Protein, ur: NEGATIVE mg/dL
Specific Gravity, Urine: 1.013 (ref 1.005–1.030)
pH: 5 (ref 5.0–8.0)

## 2020-08-04 LAB — RESEARCH LABS

## 2020-08-04 MED ORDER — ACETAMINOPHEN 325 MG PO TABS
ORAL_TABLET | ORAL | Status: AC
  Filled 2020-08-04: qty 2

## 2020-08-04 MED ORDER — ACETAMINOPHEN 325 MG PO TABS
650.0000 mg | ORAL_TABLET | Freq: Once | ORAL | Status: AC
  Administered 2020-08-04: 650 mg via ORAL

## 2020-08-04 MED ORDER — TRASTUZUMAB-ANNS CHEMO 150 MG IV SOLR
300.0000 mg | Freq: Once | INTRAVENOUS | Status: AC
  Administered 2020-08-04: 300 mg via INTRAVENOUS
  Filled 2020-08-04: qty 14.29

## 2020-08-04 MED ORDER — SODIUM CHLORIDE 0.9 % IV SOLN
Freq: Once | INTRAVENOUS | Status: AC
  Filled 2020-08-04: qty 250

## 2020-08-04 MED ORDER — HEPARIN SOD (PORK) LOCK FLUSH 100 UNIT/ML IV SOLN
500.0000 [IU] | Freq: Once | INTRAVENOUS | Status: AC | PRN
  Administered 2020-08-04: 500 [IU]
  Filled 2020-08-04: qty 5

## 2020-08-04 MED ORDER — SODIUM CHLORIDE 0.9% FLUSH
10.0000 mL | INTRAVENOUS | Status: DC | PRN
  Administered 2020-08-04: 10 mL
  Filled 2020-08-04: qty 10

## 2020-08-04 MED ORDER — SODIUM CHLORIDE 0.9% FLUSH
10.0000 mL | Freq: Once | INTRAVENOUS | Status: AC
  Administered 2020-08-04: 10 mL
  Filled 2020-08-04: qty 10

## 2020-08-04 NOTE — Research (Signed)
S9753, A PROSPECTIVE OBSERVATIONAL COHORT STUDY TO DEVELOP A PREDICTIVE MODEL OF TAXANE-INDUCED PERIPHERAL NEUROPATHY IN CANCER PATIENTS. 24 Weeks ASSESSMENTS:    PROs; Questionnaires given to patient to complete in clinic. Collected questionnaires and checked for completeness and accuracy.  Labs; Optional research labs collected per protocol along with other standard of care labs ordered by provider.   Solicited Neuropathy Events Form; Patient reports previous numbness in her lips resolved shortly after last study visit. She does report new numbness and tingling sensations in her hands and her feet for the past 2 months. It is intermittent, not painful and does not interfere with any ADLs. Patient is not taking any medication for these symptoms. Dr. Lindi Adie states this could possibly be related to taxane. Form completed and signed by Dr. Lindi Adie. Physician Toxicity Burden Form; Completed by Dr. Lindi Adie.  Timed Get Up and Go Test; Completed per protocol.  History of Falls; Patient denies any falls in the past 6 months. .  Concomitant Medications;  Reviewed medications with patient. She continues to take Tylenol PRN for pain (not related to neuropathy) once or twice a day. She is not taking any anti-depressants, gabapentin, narcotics, tramadol, duloxetine or ibuprofen. She is also not taking any Vitamin E, Glutamine, Vitamin B6, Fish Oil or Vitamin B12. Topical Agents and other Treatments Patient continues to use UdderCream with Urea 40% to her hands and feet twice daily for neuropathy symptoms.  Patient has not used any complementary or alternative medicines since last study visit.  Neuropen Assessment; Completed per protocol by this certified research RN with assistance recording by research coordinator Carol Ada.   [place] Assessment; Completed per protocol by this certified research RN with assistance timing and recording by research coordinator Carol Ada.  Treatment:  Patient continues  Herceptin today.  She is also undergoing Radiation treatments which she started on 07/23/2020. Plan: Thanked patient for her participation in this study today. Next study visit due in 6 months on 02/15/21 (+/- 28 days).  Informed patient we will try to schedule the study visit same day she is in clinic for other appointments.  Research nurse will contact her closer to that date to arrange next study visit.  Encouraged patient to call clinic if any questions or concerns prior to next appointment. She verbalized understanding.   Foye Spurling, BSN, [employer]

## 2020-08-04 NOTE — Patient Instructions (Signed)
[facility] Discharge Instructions for Patients Receiving Chemotherapy r Today you received the following chemotherapy agents Tastuzumab-anns [facility]).  To help prevent nausea and vomiting after your treatment, we encourage you to take your nausea medication as prescribed.   If you develop nausea and vomiting that is not controlled by your nausea medication, call the clinic.   BELOW ARE SYMPTOMS THAT SHOULD BE REPORTED IMMEDIATELY:  *FEVER GREATER THAN 100.5 F  *CHILLS WITH OR WITHOUT FEVER  NAUSEA AND VOMITING THAT IS NOT CONTROLLED WITH YOUR NAUSEA MEDICATION  *UNUSUAL SHORTNESS OF BREATH  *UNUSUAL BRUISING OR BLEEDING  TENDERNESS IN MOUTH AND THROAT WITH OR WITHOUT PRESENCE OF ULCERS  *URINARY PROBLEMS  *BOWEL PROBLEMS  UNUSUAL RASH Items with * indicate a potential emergency and should be followed up as soon as possible.  Feel free to call the clinic should you have any questions or concerns. The clinic phone number is [phone].  Please show the [place] at check-in to the Emergency Department and triage nurse.

## 2020-08-07 ENCOUNTER — Ambulatory Visit
Admission: RE | Admit: 2020-08-07 | Discharge: 2020-08-07 | Disposition: A | Discharge status: Home / Self Care | Payer: Medicare HMO | Source: Ambulatory Visit | Attending: Radiation Oncology | Admitting: Radiation Oncology

## 2020-08-07 DIAGNOSIS — C50512 Malignant neoplasm of lower-outer quadrant of left female breast: Secondary | ICD-10-CM | POA: Diagnosis not present

## 2020-08-07 DIAGNOSIS — Z170 Estrogen receptor positive status [ER+]: Secondary | ICD-10-CM | POA: Diagnosis not present

## 2020-08-08 ENCOUNTER — Ambulatory Visit
Admission: RE | Admit: 2020-08-08 | Discharge: 2020-08-08 | Disposition: A | Discharge status: Home / Self Care | Payer: Medicare HMO | Source: Ambulatory Visit | Attending: Radiation Oncology | Admitting: Radiation Oncology

## 2020-08-08 DIAGNOSIS — C50512 Malignant neoplasm of lower-outer quadrant of left female breast: Secondary | ICD-10-CM | POA: Diagnosis not present

## 2020-08-08 DIAGNOSIS — Z170 Estrogen receptor positive status [ER+]: Secondary | ICD-10-CM | POA: Diagnosis not present

## 2020-08-09 ENCOUNTER — Telehealth: Payer: Self-pay | Admitting: Hematology and Oncology

## 2020-08-09 ENCOUNTER — Other Ambulatory Visit: Payer: Self-pay

## 2020-08-09 ENCOUNTER — Ambulatory Visit
Admission: RE | Admit: 2020-08-09 | Discharge: 2020-08-09 | Disposition: A | Discharge status: Home / Self Care | Payer: Medicare HMO | Source: Ambulatory Visit | Attending: Radiation Oncology | Admitting: Radiation Oncology

## 2020-08-09 DIAGNOSIS — Z170 Estrogen receptor positive status [ER+]: Secondary | ICD-10-CM | POA: Diagnosis not present

## 2020-08-09 DIAGNOSIS — C50512 Malignant neoplasm of lower-outer quadrant of left female breast: Secondary | ICD-10-CM | POA: Diagnosis not present

## 2020-08-09 NOTE — Telephone Encounter (Signed)
Scheduled per 12/3 los. Pt will receive an updated appt calendar per next visit appt notes

## 2020-08-10 ENCOUNTER — Ambulatory Visit
Admission: RE | Admit: 2020-08-10 | Discharge: 2020-08-10 | Disposition: A | Discharge status: Home / Self Care | Payer: Medicare HMO | Source: Ambulatory Visit | Attending: Radiation Oncology | Admitting: Radiation Oncology

## 2020-08-10 DIAGNOSIS — Z170 Estrogen receptor positive status [ER+]: Secondary | ICD-10-CM | POA: Diagnosis not present

## 2020-08-10 DIAGNOSIS — C50512 Malignant neoplasm of lower-outer quadrant of left female breast: Secondary | ICD-10-CM | POA: Diagnosis not present

## 2020-08-11 ENCOUNTER — Ambulatory Visit
Admission: RE | Admit: 2020-08-11 | Discharge: 2020-08-11 | Disposition: A | Discharge status: Home / Self Care | Payer: Medicare HMO | Source: Ambulatory Visit | Attending: Radiation Oncology | Admitting: Radiation Oncology

## 2020-08-11 DIAGNOSIS — Z170 Estrogen receptor positive status [ER+]: Secondary | ICD-10-CM | POA: Diagnosis not present

## 2020-08-11 DIAGNOSIS — C50512 Malignant neoplasm of lower-outer quadrant of left female breast: Secondary | ICD-10-CM | POA: Diagnosis not present

## 2020-08-12 DIAGNOSIS — Z170 Estrogen receptor positive status [ER+]: Secondary | ICD-10-CM | POA: Diagnosis not present

## 2020-08-12 DIAGNOSIS — C50512 Malignant neoplasm of lower-outer quadrant of left female breast: Secondary | ICD-10-CM | POA: Diagnosis not present

## 2020-08-14 ENCOUNTER — Other Ambulatory Visit: Payer: Self-pay | Admitting: Cardiovascular Disease

## 2020-08-14 ENCOUNTER — Inpatient Hospital Stay (HOSPITAL_BASED_OUTPATIENT_CLINIC_OR_DEPARTMENT_OTHER): Payer: Medicare HMO | Admitting: Medical

## 2020-08-14 ENCOUNTER — Other Ambulatory Visit: Payer: Self-pay

## 2020-08-14 ENCOUNTER — Ambulatory Visit
Admission: RE | Admit: 2020-08-14 | Discharge: 2020-08-14 | Disposition: A | Discharge status: Home / Self Care | Payer: Medicare HMO | Source: Ambulatory Visit | Attending: Radiation Oncology | Admitting: Radiation Oncology

## 2020-08-14 VITALS — BP 113/57 | HR 69 | Temp 97.9°F | Resp 13 | Ht 62.0 in | Wt 102.5 lb

## 2020-08-14 DIAGNOSIS — L03313 Cellulitis of chest wall: Secondary | ICD-10-CM

## 2020-08-14 DIAGNOSIS — C50512 Malignant neoplasm of lower-outer quadrant of left female breast: Secondary | ICD-10-CM | POA: Diagnosis not present

## 2020-08-14 DIAGNOSIS — Z170 Estrogen receptor positive status [ER+]: Secondary | ICD-10-CM | POA: Diagnosis not present

## 2020-08-14 DIAGNOSIS — K2100 Gastro-esophageal reflux disease with esophagitis, without bleeding: Secondary | ICD-10-CM

## 2020-08-14 DIAGNOSIS — Z5112 Encounter for antineoplastic immunotherapy: Secondary | ICD-10-CM | POA: Diagnosis not present

## 2020-08-14 MED ORDER — SULFAMETHOXAZOLE-TRIMETHOPRIM 800-160 MG PO TABS: 1.0000 | ORAL_TABLET | Freq: Two times a day (BID) | ORAL | 0 refills | Status: DC

## 2020-08-14 NOTE — Progress Notes (Signed)
Pt seen by PA Van only, no assessment by [facility] RN at this time (time constraints).  PA aware. 

## 2020-08-15 ENCOUNTER — Ambulatory Visit
Admission: RE | Admit: 2020-08-15 | Discharge: 2020-08-15 | Disposition: A | Discharge status: Home / Self Care | Payer: Medicare HMO | Source: Ambulatory Visit | Attending: Radiation Oncology | Admitting: Radiation Oncology

## 2020-08-15 DIAGNOSIS — C50512 Malignant neoplasm of lower-outer quadrant of left female breast: Secondary | ICD-10-CM | POA: Diagnosis not present

## 2020-08-15 DIAGNOSIS — Z170 Estrogen receptor positive status [ER+]: Secondary | ICD-10-CM | POA: Diagnosis not present

## 2020-08-16 ENCOUNTER — Ambulatory Visit
Admission: RE | Admit: 2020-08-16 | Discharge: 2020-08-16 | Disposition: A | Discharge status: Home / Self Care | Payer: Medicare HMO | Source: Ambulatory Visit | Attending: Radiation Oncology | Admitting: Radiation Oncology

## 2020-08-16 DIAGNOSIS — Z170 Estrogen receptor positive status [ER+]: Secondary | ICD-10-CM | POA: Diagnosis not present

## 2020-08-16 DIAGNOSIS — C50512 Malignant neoplasm of lower-outer quadrant of left female breast: Secondary | ICD-10-CM | POA: Diagnosis not present

## 2020-08-16 NOTE — Progress Notes (Signed)
Symptoms Management Clinic Progress Note   Quynh Basso [id number] 08/12/1941 79 y.o.  Euphemia Lingerfelt Vinluan is managed by Dr. Nicholas Lose  Actively treated with chemotherapy/immunotherapy/hormonal therapy: yes  Current therapy: Kanjinti  Last treated: 07/14/2020 (cycle 3, day 1)  Next scheduled appointment with provider: 09/14/2020  Assessment: Plan:    Cellulitis of chest wall - Plan: sulfamethoxazole-trimethoprim (BACTRIM DS) 800-160 MG tablet  Malignant neoplasm of lower-outer quadrant of left breast of female, estrogen receptor positive ([facility])   Questionable cellulitis of the right chest wall medial to a right chest wall Port-A-Cath: Mrs. Montalban was given a prescription for Bactrim DS p.o. twice daily x7 days.  ER positive malignant neoplasm of the left breast: The patient continues receiving radiation therapy.  Additionally she is status post cycle 3, day 1 of Kanjinti which was dosed on 07/14/2020.  She continues to be followed by Dr. Nicholas Lose and is scheduled to be seen in follow-up on 09/14/2020.  Please see After Visit Summary for patient specific instructions.  Future Appointments  Date Time Provider [place]  08/17/2020 11:15 AM [facility] [id number] [facility] None  08/18/2020 11:15 AM [facility] [id number] [facility] None  08/21/2020 11:15 AM [facility] [id number] [facility] None  08/23/2020  9:00 AM [facility] INFUSION [facility] None  09/14/2020 10:00 AM [facility] LAB [facility] None  09/14/2020 10:15 AM [facility] [place] FLUSH [facility] None  09/14/2020 10:45 AM Nicholas Lose, MD [facility] None  09/14/2020 11:30 AM [facility] INFUSION [facility] None  10/05/2020 10:15 AM [facility] INFUSION [facility] None  10/26/2020 12:15 PM [facility] LAB [facility] None  10/26/2020 12:30 PM [facility] [place] FLUSH [facility] None  10/26/2020  1:00 PM Nicholas Lose, MD [facility] None  10/26/2020  1:45 PM [facility] INFUSION  [facility] None  10/31/2020 10:00 AM [name], MD [facility]  11/16/2020 10:00 AM [facility] INFUSION [facility] None    No orders of the defined types were placed in this encounter.      Subjective:   Patient ID:  Rachel Valentine is a 79 y.o. (DOB 1941/08/06) female.  Chief Complaint:  Chief Complaint  Patient presents with  . Port Infection Evaluation    HPI Rachel Valentine  is a 79 y.o. female with a diagnosis of an ER positive malignant neoplasm of the left breast.  She is followed by Dr. Nicholas Lose and is status post cycle 3, day 1 of Kanjinti which was dosed on 07/14/2020.  She continues on daily radiation therapy and is seen today for a possible cellulitis of the right chest wall medial to a right chest wall Port-A-Cath.  She is scheduled to be seen next by Dr. Nicholas Lose in follow-up on 09/14/2020.  She denies fever, chills, or sweats.  She has had no trauma or changes in activity.  Medications: I have reviewed the patient's current medications.  Allergies:  Allergies  Allergen Reactions  . Paclitaxel Rash  . Fosamax [Alendronate] Nausea Only    Unset stomach  . Prednisone Other (See Comments)    Blood pressure high when she takes it  . Clindamycin/Lincomycin Rash  . Penicillins Rash    Did it involve swelling of the face/tongue/throat, SOB, or low BP? No Did it involve sudden or severe rash/hives, skin peeling, or any reaction on the inside of your mouth or nose? No Did you need to seek medical attention at a hospital or doctor's office? Yes When did it last happen?Childhood If all above answers are "NO", may proceed with cephalosporin use.    Past Medical  History:  Diagnosis Date  . Anemia    during chemo  . Bowel obstruction ([place])   . Cancer [facility])    dx of breast cancer 2021 - per pt in PAT 02/09/20  . Chronic kidney disease    blood clot in right kidney after surgery in June, 2021  . Coronary artery disease    . GERD (gastroesophageal reflux disease)   . Hypertension   . Pneumonia   . PONV (postoperative nausea and vomiting)     Past Surgical History:  Procedure Laterality Date  . ABDOMINAL HYSTERECTOMY    . ABDOMINAL SURGERY    . APPENDECTOMY    . AUGMENTATION MAMMAPLASTY Bilateral    implants removed in 2005  . BREAST EXCISIONAL BIOPSY Left    benign  . BREAST LUMPECTOMY WITH RADIOACTIVE SEED AND SENTINEL LYMPH NODE BIOPSY Left 06/15/2020   Procedure: LEFT BREAST LUMPECTOMYX 2 WITH RADIOACTIVE SEED AND SENTINEL LYMPH NODE BIOPSY;  Surgeon: [name], MD;  Location: [place];  Service: General;  Laterality: Left;  PECTORAL BLOCK  . CORONARY ARTERY BYPASS GRAFT N/A 06/28/2019   Procedure: CORONARY ARTERY BYPASS GRAFTING (CABG) using endoscopic harvest right saphenous vein to the posterior lateral (PLB).;  Surgeon: Gaye Pollack, MD;  Location: [place] OR;  Service: Open Heart Surgery;  Laterality: N/A;  . CORONARY ATHERECTOMY N/A 01/05/2019   Procedure: CORONARY ATHERECTOMY - STENT;  Surgeon: Burnell Blanks, MD;  Location: [place] CV LAB;  Service: Cardiovascular;  Laterality: N/A;  . CORONARY STENT INTERVENTION N/A 01/04/2019   Procedure: CORONARY STENT INTERVENTION;  Surgeon: Leonie Man, MD;  Location: [place] CV LAB;  Service: Cardiovascular;  Laterality: N/A;  . LEFT HEART CATH AND CORONARY ANGIOGRAPHY N/A 01/04/2019   Procedure: LEFT HEART CATH AND CORONARY ANGIOGRAPHY;  Surgeon: Leonie Man, MD;  Location: [place] CV LAB;  Service: Cardiovascular;  Laterality: N/A;  . LEFT HEART CATH AND CORONARY ANGIOGRAPHY N/A 06/24/2019   Procedure: LEFT HEART CATH AND CORONARY ANGIOGRAPHY;  Surgeon: [name], MD;  Location: [place] CV LAB;  Service: Cardiovascular;  Laterality: N/A;  . PORTACATH PLACEMENT Right 02/15/2020   Procedure: INSERTION PORT-A-CATH WITH ULTRASOUND GUIDANCE;  Surgeon: [name], MD;  Location: [place];  Service: General;  Laterality:  Right;  . TEE WITHOUT CARDIOVERSION N/A 06/28/2019   Procedure: TRANSESOPHAGEAL ECHOCARDIOGRAM (TEE);  Surgeon: Gaye Pollack, MD;  Location: [place];  Service: Open Heart Surgery;  Laterality: N/A;  . TEMPORARY PACEMAKER N/A 01/05/2019   Procedure: TEMPORARY PACEMAKER;  Surgeon: Burnell Blanks, MD;  Location: [place] CV LAB;  Service: Cardiovascular;  Laterality: N/A;  . TONSILLECTOMY      Family History  Problem Relation Age of Onset  . Heart failure Mother   . Stroke Father   . Stroke Brother     Social History   Socioeconomic History  . Marital status: Widowed    Spouse name: Not on file  . Number of children: Not on file  . Years of education: Not on file  . Highest education level: Not on file  Occupational History  . Not on file  Tobacco Use  . Smoking status: Former Smoker    Quit date: 01/06/1999    Years since quitting: 21.6  . Smokeless tobacco: Never Used  Vaping Use  . Vaping Use: Never used  Substance and Sexual Activity  . Alcohol use: No  . Drug use: Never  . Sexual activity: Not on file  Other Topics Concern  .  Not on file  Social History Narrative  . Not on file   Social Determinants of Health   Financial Resource Strain: Not on file  Food Insecurity: Not on file  Transportation Needs: Not on file  Physical Activity: Not on file  Stress: Not on file  Social Connections: Not on file  Intimate Partner Violence: Not on file    Past Medical History, Surgical history, Social history, and Family history were reviewed and updated as appropriate.   Please see review of systems for further details on the patient's review from today.   Review of Systems:  Review of Systems  Constitutional: Negative for chills, diaphoresis and fever.  HENT: Negative for facial swelling and trouble swallowing.   Respiratory: Negative for cough, chest tightness and shortness of breath.   Cardiovascular: Negative for chest pain.  Skin: Positive for rash.     Objective:   Physical Exam:  BP (!) 113/57 (BP Location: Left Arm, Patient Position: Sitting)   Pulse 69   Temp 97.9 F (36.6 C) (Tympanic)   Resp 13   Ht 5\' 2"  (1.575 m)   Wt 102 lb 8 oz (46.5 kg)   LMP  (LMP Unknown)   SpO2 99%   BMI 18.75 kg/m  ECOG: 0  Physical Exam Constitutional:      General: She is not in acute distress.    Appearance: Normal appearance. She is not ill-appearing, toxic-appearing or diaphoretic.  HENT:     Head: Normocephalic and atraumatic.  Eyes:     General: No scleral icterus.       Right eye: No discharge.        Left eye: No discharge.     Conjunctiva/sclera: Conjunctivae normal.  Cardiovascular:     Rate and Rhythm: Normal rate and regular rhythm.     Heart sounds: No murmur heard. No friction rub. No gallop.   Pulmonary:     Effort: Pulmonary effort is normal. No respiratory distress.     Breath sounds: Normal breath sounds. No wheezing, rhonchi or rales.  Skin:    Findings: Erythema present.     Comments: Diffuse erythema is noted medial to a right chest wall Port-A-Cath.  There is one small area of granulation tissue along the medial aspect of the surgical incision immediately superior to the patient's right chest wall Port-A-Cath.  There is minimal increased warmth.  There is no exudate noted.  Neurological:     Mental Status: She is alert.     Coordination: Coordination normal.     Gait: Gait normal.  Psychiatric:        Mood and Affect: Mood normal.        Behavior: Behavior normal.        Thought Content: Thought content normal.        Judgment: Judgment normal.     Lab Review:     Component Value Date/Time   NA 139 06/21/2020 0849   NA 142 07/13/2019 1020   K 4.4 06/21/2020 0849   CL 105 06/21/2020 0849   CO2 27 06/21/2020 0849   GLUCOSE 117 (H) 06/21/2020 0849   BUN 16 06/21/2020 0849   BUN 15 07/13/2019 1020   CREATININE 0.79 06/21/2020 0849   CALCIUM 9.4 06/21/2020 0849   PROT 6.5 06/21/2020 0849   ALBUMIN  3.7 06/21/2020 0849   AST 17 06/21/2020 0849   ALT 7 06/21/2020 0849   ALKPHOS 84 06/21/2020 0849   BILITOT 0.3 06/21/2020 0849   GFRNONAA >60 06/21/2020  0849   GFRAA >60 05/10/2020 1458       Component Value Date/Time   WBC 7.2 06/21/2020 0849   WBC 8.7 06/09/2020 1044   RBC 3.85 (L) 06/21/2020 0849   HGB 10.7 (L) 06/21/2020 0849   HGB 10.3 (L) 07/13/2019 1020   HCT 34.5 (L) 06/21/2020 0849   HCT 31.4 (L) 07/13/2019 1020   PLT 323 06/21/2020 0849   PLT 514 (H) 07/13/2019 1020   MCV 89.6 06/21/2020 0849   MCV 91 07/13/2019 1020   MCH 27.8 06/21/2020 0849   MCHC 31.0 06/21/2020 0849   RDW 16.6 (H) 06/21/2020 0849   RDW 14.5 07/13/2019 1020   LYMPHSABS 1.1 06/21/2020 0849   MONOABS 0.6 06/21/2020 0849   EOSABS 1.0 (H) 06/21/2020 0849   BASOSABS 0.1 06/21/2020 0849   -------------------------------  Imaging from last 24 hours (if applicable):  Radiology interpretation: No results found.

## 2020-08-17 ENCOUNTER — Other Ambulatory Visit: Payer: Self-pay

## 2020-08-17 ENCOUNTER — Ambulatory Visit
Admission: RE | Admit: 2020-08-17 | Discharge: 2020-08-17 | Disposition: A | Discharge status: Home / Self Care | Payer: Medicare HMO | Source: Ambulatory Visit | Attending: Radiation Oncology | Admitting: Radiation Oncology

## 2020-08-17 DIAGNOSIS — C50512 Malignant neoplasm of lower-outer quadrant of left female breast: Secondary | ICD-10-CM | POA: Diagnosis not present

## 2020-08-17 DIAGNOSIS — Z170 Estrogen receptor positive status [ER+]: Secondary | ICD-10-CM | POA: Diagnosis not present

## 2020-08-18 ENCOUNTER — Ambulatory Visit
Admission: RE | Admit: 2020-08-18 | Discharge: 2020-08-18 | Disposition: A | Discharge status: Home / Self Care | Payer: Medicare HMO | Source: Ambulatory Visit | Attending: Radiation Oncology | Admitting: Radiation Oncology

## 2020-08-18 DIAGNOSIS — C50512 Malignant neoplasm of lower-outer quadrant of left female breast: Secondary | ICD-10-CM | POA: Diagnosis not present

## 2020-08-18 DIAGNOSIS — Z170 Estrogen receptor positive status [ER+]: Secondary | ICD-10-CM | POA: Diagnosis not present

## 2020-08-21 ENCOUNTER — Encounter: Payer: Self-pay | Admitting: *Deleted

## 2020-08-21 ENCOUNTER — Encounter: Payer: Self-pay | Admitting: Radiation Oncology

## 2020-08-21 ENCOUNTER — Ambulatory Visit
Admission: RE | Admit: 2020-08-21 | Discharge: 2020-08-21 | Disposition: A | Discharge status: Home / Self Care | Payer: Medicare HMO | Source: Ambulatory Visit | Attending: Radiation Oncology | Admitting: Radiation Oncology

## 2020-08-21 DIAGNOSIS — C50512 Malignant neoplasm of lower-outer quadrant of left female breast: Secondary | ICD-10-CM | POA: Diagnosis not present

## 2020-08-21 DIAGNOSIS — Z170 Estrogen receptor positive status [ER+]: Secondary | ICD-10-CM | POA: Diagnosis not present

## 2020-08-22 ENCOUNTER — Ambulatory Visit: Payer: Medicare HMO

## 2020-08-23 ENCOUNTER — Inpatient Hospital Stay: Payer: Medicare HMO

## 2020-08-23 ENCOUNTER — Other Ambulatory Visit: Payer: Self-pay

## 2020-08-23 VITALS — BP 121/54 | HR 73 | Temp 98.1°F | Resp 16 | Wt 100.8 lb

## 2020-08-23 DIAGNOSIS — C50512 Malignant neoplasm of lower-outer quadrant of left female breast: Secondary | ICD-10-CM

## 2020-08-23 DIAGNOSIS — Z170 Estrogen receptor positive status [ER+]: Secondary | ICD-10-CM

## 2020-08-23 DIAGNOSIS — Z5112 Encounter for antineoplastic immunotherapy: Secondary | ICD-10-CM | POA: Diagnosis not present

## 2020-08-23 DIAGNOSIS — Z95828 Presence of other vascular implants and grafts: Secondary | ICD-10-CM

## 2020-08-23 MED ORDER — SODIUM CHLORIDE 0.9% FLUSH
10.0000 mL | INTRAVENOUS | Status: DC | PRN
  Administered 2020-08-23: 10 mL
  Filled 2020-08-23: qty 10

## 2020-08-23 MED ORDER — DENOSUMAB 60 MG/ML ~~LOC~~ SOSY
60.0000 mg | PREFILLED_SYRINGE | Freq: Once | SUBCUTANEOUS | Status: AC
  Administered 2020-08-23: 60 mg via SUBCUTANEOUS

## 2020-08-23 MED ORDER — ACETAMINOPHEN 325 MG PO TABS
ORAL_TABLET | ORAL | Status: AC
  Filled 2020-08-23: qty 2

## 2020-08-23 MED ORDER — DENOSUMAB 60 MG/ML ~~LOC~~ SOSY
PREFILLED_SYRINGE | SUBCUTANEOUS | Status: AC
  Filled 2020-08-23: qty 1

## 2020-08-23 MED ORDER — SODIUM CHLORIDE 0.9 % IV SOLN
Freq: Once | INTRAVENOUS | Status: AC
  Filled 2020-08-23: qty 250

## 2020-08-23 MED ORDER — HEPARIN SOD (PORK) LOCK FLUSH 100 UNIT/ML IV SOLN
500.0000 [IU] | Freq: Once | INTRAVENOUS | Status: AC | PRN
Start: 2020-08-23 — End: 2020-08-23
  Administered 2020-08-23: 500 [IU]
  Filled 2020-08-23: qty 5

## 2020-08-23 MED ORDER — ACETAMINOPHEN 325 MG PO TABS
650.0000 mg | ORAL_TABLET | Freq: Once | ORAL | Status: AC
Start: 2020-08-23 — End: 2020-08-23
  Administered 2020-08-23: 650 mg via ORAL

## 2020-08-23 MED ORDER — TRASTUZUMAB-ANNS CHEMO 150 MG IV SOLR
300.0000 mg | Freq: Once | INTRAVENOUS | Status: AC
  Administered 2020-08-23: 300 mg via INTRAVENOUS
  Filled 2020-08-23: qty 14.29

## 2020-08-23 NOTE — Progress Notes (Signed)
OK to begin Prolia treatment today using Cmet from 06/22/20.  Hardie Pulley, PharmD, BCPS, BCOP

## 2020-08-23 NOTE — Patient Instructions (Addendum)
[facility] Discharge Instructions for Patients Receiving Chemotherapy  Today you received the following immunotherapy agent: Trastuzumab  To help prevent nausea and vomiting after your treatment, we encourage you to take your nausea medication as directed by your MD.   If you develop nausea and vomiting that is not controlled by your nausea medication, call the clinic.   BELOW ARE SYMPTOMS THAT SHOULD BE REPORTED IMMEDIATELY:  *FEVER GREATER THAN 100.5 F  *CHILLS WITH OR WITHOUT FEVER  NAUSEA AND VOMITING THAT IS NOT CONTROLLED WITH YOUR NAUSEA MEDICATION  *UNUSUAL SHORTNESS OF BREATH  *UNUSUAL BRUISING OR BLEEDING  TENDERNESS IN MOUTH AND THROAT WITH OR WITHOUT PRESENCE OF ULCERS  *URINARY PROBLEMS  *BOWEL PROBLEMS  UNUSUAL RASH Items with * indicate a potential emergency and should be followed up as soon as possible.  Feel free to call the clinic should you have any questions or concerns. The clinic phone number is [phone].  Please show the [facility] at check-in to the Emergency Department and triage nurse.  Denosumab injection What is this medicine? DENOSUMAB (den oh sue mab) slows bone breakdown. Prolia is used to treat osteoporosis in women after menopause and in men, and in people who are taking corticosteroids for 6 months or more. Delton See is used to treat a high calcium level due to cancer and to prevent bone fractures and other bone problems caused by multiple myeloma or cancer bone metastases. Delton See is also used to treat giant cell tumor of the bone. This medicine may be used for other purposes; ask your health care provider or pharmacist if you have questions. COMMON BRAND NAME(S): Prolia, XGEVA What should I tell my health care provider before I take this medicine? They need to know if you have any of these conditions:  dental disease  having surgery or tooth extraction  infection  kidney disease  low levels of calcium or  Vitamin D in the blood  malnutrition  on hemodialysis  skin conditions or sensitivity  thyroid or parathyroid disease  an unusual reaction to denosumab, other medicines, foods, dyes, or preservatives  pregnant or trying to get pregnant  breast-feeding How should I use this medicine? This medicine is for injection under the skin. It is given by a health care professional in a hospital or clinic setting. A special MedGuide will be given to you before each treatment. Be sure to read this information carefully each time. For Prolia, talk to your pediatrician regarding the use of this medicine in children. Special care may be needed. For Delton See, talk to your pediatrician regarding the use of this medicine in children. While this drug may be prescribed for children as young as 13 years for selected conditions, precautions do apply. Overdosage: If you think you have taken too much of this medicine contact a poison control center or emergency room at once. NOTE: This medicine is only for you. Do not share this medicine with others. What if I miss a dose? It is important not to miss your dose. Call your doctor or health care professional if you are unable to keep an appointment. What may interact with this medicine? Do not take this medicine with any of the following medications:  other medicines containing denosumab This medicine may also interact with the following medications:  medicines that lower your chance of fighting infection  steroid medicines like prednisone or cortisone This list may not describe all possible interactions. Give your health care provider a list of all the  medicines, herbs, non-prescription drugs, or dietary supplements you use. Also tell them if you smoke, drink alcohol, or use illegal drugs. Some items may interact with your medicine. What should I watch for while using this medicine? Visit your doctor or health care professional for regular checks on your  progress. Your doctor or health care professional may order blood tests and other tests to see how you are doing. Call your doctor or health care professional for advice if you get a fever, chills or sore throat, or other symptoms of a cold or flu. Do not treat yourself. This drug may decrease your body's ability to fight infection. Try to avoid being around people who are sick. You should make sure you get enough calcium and vitamin D while you are taking this medicine, unless your doctor tells you not to. Discuss the foods you eat and the vitamins you take with your health care professional. See your dentist regularly. Brush and floss your teeth as directed. Before you have any dental work done, tell your dentist you are receiving this medicine. Do not become pregnant while taking this medicine or for 5 months after stopping it. Talk with your doctor or health care professional about your birth control options while taking this medicine. Women should inform their doctor if they wish to become pregnant or think they might be pregnant. There is a potential for serious side effects to an unborn child. Talk to your health care professional or pharmacist for more information. What side effects may I notice from receiving this medicine? Side effects that you should report to your doctor or health care professional as soon as possible:  allergic reactions like skin rash, itching or hives, swelling of the face, lips, or tongue  bone pain  breathing problems  dizziness  jaw pain, especially after dental work  redness, blistering, peeling of the skin  signs and symptoms of infection like fever or chills; cough; sore throat; pain or trouble passing urine  signs of low calcium like fast heartbeat, muscle cramps or muscle pain; pain, tingling, numbness in the hands or feet; seizures  unusual bleeding or bruising  unusually weak or tired Side effects that usually do not require medical attention  (report to your doctor or health care professional if they continue or are bothersome):  constipation  diarrhea  headache  joint pain  loss of appetite  muscle pain  runny nose  tiredness  upset stomach This list may not describe all possible side effects. Call your doctor for medical advice about side effects. You may report side effects to FDA at 1-800-FDA-1088. Where should I keep my medicine? This medicine is only given in a clinic, doctor's office, or other health care setting and will not be stored at home. NOTE: This sheet is a summary. It may not cover all possible information. If you have questions about this medicine, talk to your doctor, pharmacist, or health care provider.  2020 Elsevier/Gold Standard (2017-12-26 16:10:44)

## 2020-08-23 NOTE — Progress Notes (Signed)
Pt. stable for discharge. Left via ambulation, no respiratory distress noted. 

## 2020-08-23 NOTE — Progress Notes (Signed)
Pt. port remains with slight swelling and redness, but pt. states it looks "better than before". She is on second round of antibiotics. Port accessed and pt. denied pain and blood return noted. Per Dr. Lorenso Courier ok to use port a cath for treatment.

## 2020-08-24 ENCOUNTER — Ambulatory Visit: Payer: Medicare HMO

## 2020-09-12 ENCOUNTER — Other Ambulatory Visit: Payer: Self-pay | Admitting: *Deleted

## 2020-09-12 DIAGNOSIS — C50512 Malignant neoplasm of lower-outer quadrant of left female breast: Secondary | ICD-10-CM

## 2020-09-12 DIAGNOSIS — Z170 Estrogen receptor positive status [ER+]: Secondary | ICD-10-CM

## 2020-09-13 NOTE — Progress Notes (Signed)
Patient Care Team: [name], MD as PCP - General (Family Medicine) [name], MD as PCP - Cardiology (Cardiology) [name], MD as Surgeon (General Surgery) [name], RN as Oncology Nurse Navigator Mauro Kaufmann, RN as Oncology Nurse Navigator  DIAGNOSIS:    ICD-10-CM   1. Malignant neoplasm of lower-outer quadrant of left breast of female, estrogen receptor positive ([place])  C50.512    Z17.0     SUMMARY OF ONCOLOGIC HISTORY: Oncology History  Malignant neoplasm of lower-outer quadrant of left breast of female, estrogen receptor positive ([place])  01/18/2020 Initial Diagnosis   Palpable left breast lump with calcifications at 5 o'clock position measuring 3.3 cm.  Biopsy revealed grade 3 IDC ER 95%, PR 0%, HER-2 3+ positive, Ki-67 50%, axilla negative, T2N0   01/26/2020 Cancer Staging   Staging form: Breast, AJCC 8th Edition - Clinical stage from 01/26/2020: Stage IIA (cT2, cN0, cM0, G3, ER+, PR-, HER2+) - Signed by Nicholas Lose, MD on 01/26/2020   02/16/2020 - 05/13/2020 Chemotherapy   The patient had PACLitaxel-protein bound (ABRAXANE) chemo infusion 125 mg, 80 mg/m2 = 125 mg (100 % of original dose 80 mg/m2), Intravenous,  Once, 3 of 3 cycles Dose modification: 80 mg/m2 (original dose 80 mg/m2, Cycle 2) Administration: 125 mg (03/22/2020), 125 mg (03/29/2020), 125 mg (04/06/2020), 125 mg (04/12/2020), 125 mg (04/20/2020), 125 mg (04/26/2020), 125 mg (05/05/2020), 125 mg (05/13/2020) PACLitaxel (TAXOL) 120 mg in sodium chloride 0.9 % 250 mL chemo infusion (</= 2m/m2), 80 mg/m2 = 120 mg, Intravenous,  Once, 1 of 1 cycle Administration: 120 mg (02/16/2020), 120 mg (02/23/2020), 120 mg (03/01/2020), 120 mg (03/08/2020) trastuzumab-anns (KANJINTI) 189 mg in sodium chloride 0.9 % 250 mL chemo infusion, 4 mg/kg = 189 mg, Intravenous,  Once, 3 of 3 cycles Dose modification: 6 mg/kg (original dose 2 mg/kg, Cycle 3, Reason: Other (see comments), Comment: maintenance  Trastuzumab to start) Administration: 189 mg (02/16/2020), 105 mg (02/23/2020), 105 mg (03/01/2020), 105 mg (03/08/2020), 105 mg (03/15/2020), 105 mg (03/22/2020), 105 mg (03/29/2020), 105 mg (04/06/2020), 105 mg (04/12/2020), 105 mg (04/20/2020), 105 mg (04/26/2020), 300 mg (05/05/2020)  for chemotherapy treatment.    05/31/2020 -  Chemotherapy   The patient had trastuzumab-anns (KANJINTI) 300 mg in sodium chloride 0.9 % 250 mL chemo infusion, 300 mg, Intravenous,  Once, 5 of 13 cycles Dose modification: 6 mg/kg (original dose 6 mg/kg, Cycle 1, Reason: Other (see comments), Comment: biosimilar) Administration: 300 mg (06/21/2020), 300 mg (07/14/2020), 300 mg (08/04/2020), 300 mg (08/23/2020), 300 mg (05/31/2020)  for chemotherapy treatment.    06/15/2020 Surgery   Left lumpectomy ([facility]): no residual carcinoma, 4 left axillary lymph nodes negative for carcinoma   07/24/2020 - 08/21/2020 Radiation Therapy   Adjuvant radiation     CHIEF COMPLIANT: Follow-up of left breast cancer on Herceptin  INTERVAL HISTORY: Rachel Valentine a 80y.o. with above-mentioned history of left breastcancerwho completedneoadjuvant chemotherapy, underwent a left lumpectomy, completed radiation on 08/21/20, and is currently on Herceptin. She presents to the clinic today for treatment.   ALLERGIES:  is allergic to paclitaxel, fosamax [alendronate], prednisone, clindamycin/lincomycin, and penicillins.  MEDICATIONS:  Current Outpatient Medications  Medication Sig Dispense Refill  . Biotin 10000 MCG TABS Take 10,000 mg by mouth daily.     . calcium citrate (CALCITRATE - DOSED IN MG ELEMENTAL CALCIUM) 950 (200 Ca) MG tablet Take 200 mg of elemental calcium by mouth 2 (two) times daily.     . Calcium-Phosphorus-Vitamin D (CITRACAL +  D3 PO) Take 1 tablet by mouth in the morning and at bedtime.     . diphenhydrAMINE (BENADRYL) 25 mg capsule Take 25 mg by mouth at bedtime as needed for allergies.     Marland Kitchen ipratropium  (ATROVENT) 0.03 % nasal spray Place 2 sprays into both nostrils daily.    . metoprolol succinate (TOPROL-XL) 25 MG 24 hr tablet Take 25 mg by mouth daily.    . nitroGLYCERIN (NITROSTAT) 0.4 MG SL tablet Place 1 tablet (0.4 mg total) under the tongue every 5 (five) minutes as needed for chest pain. (Patient not taking: Reported on 07/19/2020) 30 tablet 0  . pantoprazole (PROTONIX) 40 MG tablet TAKE 1 TABLET BY MOUTH EVERY DAY 90 tablet 2  . prochlorperazine (COMPAZINE) 10 MG tablet Take 10 mg by mouth every 6 (six) hours as needed for nausea or vomiting. (Patient not taking: Reported on 07/19/2020)    . Propylene Glycol (SYSTANE BALANCE) 0.6 % SOLN Place 2 drops into both eyes daily as needed (Dry eyes).     . rosuvastatin (CRESTOR) 40 MG tablet Take 40 mg by mouth daily.     Marland Kitchen sulfamethoxazole-trimethoprim (BACTRIM DS) 800-160 MG tablet Take 1 tablet by mouth 2 (two) times daily. 14 tablet 0   No current facility-administered medications for this visit.    PHYSICAL EXAMINATION: ECOG PERFORMANCE STATUS: 1 - Symptomatic but completely ambulatory  Vitals:   09/14/20 1034  BP: (!) 148/48  Pulse: 62  Resp: 15  Temp: (!) 97 F (36.1 C)  SpO2: 100%   Filed Weights   09/14/20 1034  Weight: 102 lb 1.6 oz (46.3 kg)     LABORATORY DATA:  I have reviewed the data as listed CMP Latest Ref Rng & Units 09/14/2020 06/21/2020 06/09/2020  Glucose 70 - 99 mg/dL 104(H) 117(H) 96  BUN 8 - 23 mg/dL 15 16 16   Creatinine 0.44 - 1.00 mg/dL 0.81 0.79 0.70  Sodium 135 - 145 mmol/L 141 139 139  Potassium 3.5 - 5.1 mmol/L 4.0 4.4 4.4  Chloride 98 - 111 mmol/L 108 105 104  CO2 22 - 32 mmol/L 26 27 25   Calcium 8.9 - 10.3 mg/dL 9.3 9.4 9.7  Total Protein 6.5 - 8.1 g/dL 7.0 6.5 6.8  Total Bilirubin 0.3 - 1.2 mg/dL 0.4 0.3 0.2(L)  Alkaline Phos 38 - 126 U/L 98 84 79  AST 15 - 41 U/L 19 17 16   ALT 0 - 44 U/L 7 7 10     Lab Results  Component Value Date   WBC 6.9 09/14/2020   HGB 11.7 (L) 09/14/2020    HCT 36.0 09/14/2020   MCV 85.9 09/14/2020   PLT 274 09/14/2020   NEUTROABS 4.6 09/14/2020    ASSESSMENT & PLAN:  Malignant neoplasm of lower-outer quadrant of left breast of female, estrogen receptor positive ([place]) 01/18/2020:Palpable left breast lump with calcifications at 5 o'clock position measuring 3.3 cm. Biopsy revealed grade 3 IDC ER 95%, PR 0%, HER-2 3+ positive, Ki-67 50%, axilla negative, T2N0 stage IIa clinical stage Bilateral implants removed 2005  Treatment plan: 1.Neo- adjuvant chemotherapy with Taxol Herceptin 2.Breast conserving surgery with sentinel lymph node biopsy 3.Adjuvant radiation therapy completed 08/21/2020 4.Adjuvant antiestrogen therapywith anastrozole 1 mg daily x5 years  Breast MRI: 2.3 cm mass, indeterminate 8 mm mass (Biopsy: ADH), no LN --------------------------------------------------------------------------------------------------------------------------------------------------------------- 06/15/20:Left lumpectomy ([facility]): no residual carcinoma, 4 left axillary lymph nodes negative for carcinoma  Plan: 1.Anti-HER-2 therapy to be completed 02/08/2021 2.adjuvant radiation therapy completed 08/21/2020 3.Adjuvant antiestrogen therapywith anastrozole 1  mg daily x5 years  started January 2022)  Anastrozole  : I sent a prescription for anastrozole today. She will start taking it as soon as she picks it up.  Osteoporosis: In 2018 at bone density was T score -2.6: I recommended that she receive Prolia injections every 6 months.  We will added to her next treatment.  Every 3-week Herceptin will be continued. I will see the patient every 6 weeks.    No orders of the defined types were placed in this encounter.  The patient has a good understanding of the overall plan. she agrees with it. she will call with any problems that may develop before the next visit here.  Total time spent: 30 mins including face to face time and time spent  for planning, charting and coordination of care  Nicholas Lose, MD 09/14/2020  I, Cloyde Reams Dorshimer, am acting as scribe for Dr. Nicholas Lose.  I have reviewed the above documentation for accuracy and completeness, and I agree with the above.

## 2020-09-14 ENCOUNTER — Inpatient Hospital Stay: Payer: Medicare HMO | Attending: Hematology and Oncology

## 2020-09-14 ENCOUNTER — Inpatient Hospital Stay: Payer: Medicare HMO

## 2020-09-14 ENCOUNTER — Inpatient Hospital Stay: Payer: Medicare HMO | Admitting: Hematology and Oncology

## 2020-09-14 ENCOUNTER — Other Ambulatory Visit: Payer: Self-pay

## 2020-09-14 DIAGNOSIS — Z170 Estrogen receptor positive status [ER+]: Secondary | ICD-10-CM

## 2020-09-14 DIAGNOSIS — Z5112 Encounter for antineoplastic immunotherapy: Secondary | ICD-10-CM | POA: Insufficient documentation

## 2020-09-14 DIAGNOSIS — Z79811 Long term (current) use of aromatase inhibitors: Secondary | ICD-10-CM | POA: Insufficient documentation

## 2020-09-14 DIAGNOSIS — Z95828 Presence of other vascular implants and grafts: Secondary | ICD-10-CM

## 2020-09-14 DIAGNOSIS — Z923 Personal history of irradiation: Secondary | ICD-10-CM | POA: Diagnosis not present

## 2020-09-14 DIAGNOSIS — C50512 Malignant neoplasm of lower-outer quadrant of left female breast: Secondary | ICD-10-CM | POA: Diagnosis not present

## 2020-09-14 LAB — CBC WITH DIFFERENTIAL (CANCER CENTER ONLY)
Abs Immature Granulocytes: 0.02 10*3/uL (ref 0.00–0.07)
Basophils Absolute: 0.1 10*3/uL (ref 0.0–0.1)
Basophils Relative: 1 %
Eosinophils Absolute: 0.8 10*3/uL — ABNORMAL HIGH (ref 0.0–0.5)
Eosinophils Relative: 12 %
HCT: 36 % (ref 36.0–46.0)
Hemoglobin: 11.7 g/dL — ABNORMAL LOW (ref 12.0–15.0)
Immature Granulocytes: 0 %
Lymphocytes Relative: 12 %
Lymphs Abs: 0.8 10*3/uL (ref 0.7–4.0)
MCH: 27.9 pg (ref 26.0–34.0)
MCHC: 32.5 g/dL (ref 30.0–36.0)
MCV: 85.9 fL (ref 80.0–100.0)
Monocytes Absolute: 0.6 10*3/uL (ref 0.1–1.0)
Monocytes Relative: 8 %
Neutro Abs: 4.6 10*3/uL (ref 1.7–7.7)
Neutrophils Relative %: 67 %
Platelet Count: 274 10*3/uL (ref 150–400)
RBC: 4.19 MIL/uL (ref 3.87–5.11)
RDW: 16 % — ABNORMAL HIGH (ref 11.5–15.5)
WBC Count: 6.9 10*3/uL (ref 4.0–10.5)
nRBC: 0 % (ref 0.0–0.2)

## 2020-09-14 LAB — CMP (CANCER CENTER ONLY)
ALT: 7 U/L (ref 0–44)
AST: 19 U/L (ref 15–41)
Albumin: 3.9 g/dL (ref 3.5–5.0)
Alkaline Phosphatase: 98 U/L (ref 38–126)
Anion gap: 7 (ref 5–15)
BUN: 15 mg/dL (ref 8–23)
CO2: 26 mmol/L (ref 22–32)
Calcium: 9.3 mg/dL (ref 8.9–10.3)
Chloride: 108 mmol/L (ref 98–111)
Creatinine: 0.81 mg/dL (ref 0.44–1.00)
GFR, Estimated: >60 mL/min (ref >60)
Glucose, Bld: 104 mg/dL — ABNORMAL HIGH (ref 70–99)
Potassium: 4 mmol/L (ref 3.5–5.1)
Sodium: 141 mmol/L (ref 135–145)
Total Bilirubin: 0.4 mg/dL (ref 0.3–1.2)
Total Protein: 7 g/dL (ref 6.5–8.1)

## 2020-09-14 MED ORDER — ACETAMINOPHEN 325 MG PO TABS
650.0000 mg | ORAL_TABLET | Freq: Once | ORAL | Status: AC
  Administered 2020-09-14: 650 mg via ORAL

## 2020-09-14 MED ORDER — SODIUM CHLORIDE 0.9 % IV SOLN
Freq: Once | INTRAVENOUS | Status: AC
  Filled 2020-09-14: qty 250

## 2020-09-14 MED ORDER — HEPARIN SOD (PORK) LOCK FLUSH 100 UNIT/ML IV SOLN
500.0000 [IU] | Freq: Once | INTRAVENOUS | Status: AC | PRN
  Administered 2020-09-14: 500 [IU]
  Filled 2020-09-14: qty 5

## 2020-09-14 MED ORDER — ACETAMINOPHEN 325 MG PO TABS
ORAL_TABLET | ORAL | Status: AC
  Filled 2020-09-14: qty 2

## 2020-09-14 MED ORDER — SODIUM CHLORIDE 0.9% FLUSH
10.0000 mL | Freq: Once | INTRAVENOUS | Status: AC
  Administered 2020-09-14: 10 mL
  Filled 2020-09-14: qty 10

## 2020-09-14 MED ORDER — SODIUM CHLORIDE 0.9% FLUSH
10.0000 mL | INTRAVENOUS | Status: DC | PRN
  Administered 2020-09-14: 10 mL
  Filled 2020-09-14: qty 10

## 2020-09-14 MED ORDER — SODIUM CHLORIDE 0.9 % IV SOLN
300.0000 mg | Freq: Once | INTRAVENOUS | Status: AC
Start: 2020-09-14 — End: 2020-09-14
  Administered 2020-09-14: 300 mg via INTRAVENOUS
  Filled 2020-09-14: qty 14.29

## 2020-09-14 MED ORDER — ANASTROZOLE 1 MG PO TABS: 1.0000 mg | ORAL_TABLET | Freq: Every day | ORAL | 3 refills | Status: DC

## 2020-09-14 NOTE — Patient Instructions (Signed)

## 2020-09-14 NOTE — Patient Instructions (Signed)
[facility] Discharge Instructions for Patients Receiving Chemotherapy  Today you received the following chemotherapy agents Traztuzumab  To help prevent nausea and vomiting after your treatment, we encourage you to take your nausea medication as directed.    If you develop nausea and vomiting that is not controlled by your nausea medication, call the clinic.   BELOW ARE SYMPTOMS THAT SHOULD BE REPORTED IMMEDIATELY:  *FEVER GREATER THAN 100.5 F  *CHILLS WITH OR WITHOUT FEVER  NAUSEA AND VOMITING THAT IS NOT CONTROLLED WITH YOUR NAUSEA MEDICATION  *UNUSUAL SHORTNESS OF BREATH  *UNUSUAL BRUISING OR BLEEDING  TENDERNESS IN MOUTH AND THROAT WITH OR WITHOUT PRESENCE OF ULCERS  *URINARY PROBLEMS  *BOWEL PROBLEMS  UNUSUAL RASH Items with * indicate a potential emergency and should be followed up as soon as possible.  Feel free to call the clinic should you have any questions or concerns. The clinic phone number is [phone].  Please show the CHEMO ALERT CARD at check-in to the Emergency Department and triage nurse.   

## 2020-09-14 NOTE — Assessment & Plan Note (Signed)
01/18/2020:Palpable left breast lump with calcifications at 5 o'clock position measuring 3.3 cm. Biopsy revealed grade 3 IDC ER 95%, PR 0%, HER-2 3+ positive, Ki-67 50%, axilla negative, T2N0 stage IIa clinical stage Bilateral implants removed 2005  Treatment plan: 1.Neo- adjuvant chemotherapy with Taxol Herceptin 2.Breast conserving surgery with sentinel lymph node biopsy 3.Adjuvant radiation therapy followed by 4.Adjuvant antiestrogen therapywith anastrozole 1 mg daily x5 years  Breast MRI: 2.3 cm mass, indeterminate 8 mm mass (Biopsy: ADH), no LN --------------------------------------------------------------------------------------------------------------------------------------------------------------- 06/15/20:Left lumpectomy ([facility]): no residual carcinoma, 4 left axillary lymph nodes negative for carcinoma  Plan: 1.Anti-HER-2 therapy  2.adjuvant radiation therapy followed by 3.Adjuvant antiestrogen therapywith anastrozole 1 mg daily x5 years  started January 2022)  Anastrozole toxicities:  Osteoporosis: In 2018 at bone density was T score -2.6: I recommended that she receive Prolia injections every 6 months.  We will added to her next treatment.  Every 3-week Herceptin will be continued. I will see the patient every 6 weeks.

## 2020-09-18 ENCOUNTER — Telehealth: Payer: Self-pay | Admitting: Hematology and Oncology

## 2020-09-18 NOTE — Telephone Encounter (Signed)
Scheduled per 1/13 los. Pt will receive an updated appt calendar per next appt notes

## 2020-09-19 ENCOUNTER — Telehealth: Payer: Self-pay | Admitting: Radiation Oncology

## 2020-09-19 NOTE — Telephone Encounter (Signed)
  Radiation Oncology         [phone]) [phone] ________________________________  Name: Rachel Valentine MRN: [id number]  Date of Service: 09/25/20  DOB: 12/09/1940  Post Treatment Telephone Note  Diagnosis:   Stage IIA, cT2N0M0 grade 3, ER positive, HER2 amplified invasive ductal carcinoma of the left breast with a complete pathologic response  Interval Since Last Radiation:  5 weeks   07/23/20-08/21/20: The left breast was treated to 42.56 Gy in 16 fractions followed by an 8 Gy boost in 4 fractions.   Narrative:  The patient was contacted today for routine follow-up. During treatment she did very well with radiotherapy and did not have significant desquamation. She developed a port a cath  reports she feels her skin has resolved.  Impression/Plan: 1. Stage IIA, cT2N0M0 grade 3, ER positive, HER2 amplified invasive ductal carcinoma of the left breast with a complete pathologic response. The patient has been doing well since completion of radiotherapy. We discussed that we would be happy to continue to follow her as needed, but she will also continue to follow up with Dr. Lindi Adie in medical oncology. She was counseled on skin care as well as measures to avoid sun exposure to this area.  2. Survivorship. We discussed the importance of survivorship evaluation and encouraged her to attend her upcoming visit with that clinic.    Carola Rhine, PAC

## 2020-09-26 NOTE — Progress Notes (Signed)
  Radiation Oncology         [phone] ________________________________  Name: Rachel Valentine MRN: [id number]  Date: 08/21/2020  DOB: 1941-04-16  End of Treatment Note  Diagnosis:   left-sided breast cancer     Indication for treatment:  Curative       Radiation treatment dates:   07/23/20 - 08/21/20  Site/dose:   The patient initially received a dose of 42.56 Gy in 16 fractions to the breast using whole-breast tangent fields. This was delivered using a 3-D conformal technique. The patient then received a boost to the seroma. This delivered an additional 8 Gy in 4 fractions using a 3 field photon technique due to the depth of the seroma. The total dose was 50.56 Gy.  Narrative: The patient tolerated radiation treatment relatively well.   The patient had some expected skin irritation as she progressed during treatment.    Plan: The patient has completed radiation treatment. The patient will return to radiation oncology clinic for routine followup in one month. I advised the patient to call or return sooner if they have any questions or concerns related to their recovery or treatment. ________________________________  Jodelle Gross, M.D., Ph.D.

## 2020-09-27 DIAGNOSIS — E7800 Pure hypercholesterolemia, unspecified: Secondary | ICD-10-CM | POA: Diagnosis not present

## 2020-09-27 DIAGNOSIS — C50412 Malignant neoplasm of upper-outer quadrant of left female breast: Secondary | ICD-10-CM | POA: Diagnosis not present

## 2020-09-27 DIAGNOSIS — I700 Atherosclerosis of aorta: Secondary | ICD-10-CM | POA: Diagnosis not present

## 2020-09-27 DIAGNOSIS — I2510 Atherosclerotic heart disease of native coronary artery without angina pectoris: Secondary | ICD-10-CM | POA: Diagnosis not present

## 2020-09-27 DIAGNOSIS — M810 Age-related osteoporosis without current pathological fracture: Secondary | ICD-10-CM | POA: Diagnosis not present

## 2020-09-27 DIAGNOSIS — I10 Essential (primary) hypertension: Secondary | ICD-10-CM | POA: Diagnosis not present

## 2020-10-05 ENCOUNTER — Other Ambulatory Visit: Payer: Self-pay

## 2020-10-05 ENCOUNTER — Inpatient Hospital Stay: Payer: Medicare HMO | Attending: Hematology and Oncology

## 2020-10-05 VITALS — BP 128/52 | HR 77 | Temp 97.8°F | Resp 16 | Wt 104.2 lb

## 2020-10-05 DIAGNOSIS — Z5112 Encounter for antineoplastic immunotherapy: Secondary | ICD-10-CM | POA: Insufficient documentation

## 2020-10-05 DIAGNOSIS — Z79811 Long term (current) use of aromatase inhibitors: Secondary | ICD-10-CM | POA: Diagnosis not present

## 2020-10-05 DIAGNOSIS — Z170 Estrogen receptor positive status [ER+]: Secondary | ICD-10-CM | POA: Diagnosis not present

## 2020-10-05 DIAGNOSIS — C50512 Malignant neoplasm of lower-outer quadrant of left female breast: Secondary | ICD-10-CM

## 2020-10-05 DIAGNOSIS — Z79899 Other long term (current) drug therapy: Secondary | ICD-10-CM | POA: Insufficient documentation

## 2020-10-05 DIAGNOSIS — M810 Age-related osteoporosis without current pathological fracture: Secondary | ICD-10-CM | POA: Insufficient documentation

## 2020-10-05 DIAGNOSIS — Z923 Personal history of irradiation: Secondary | ICD-10-CM | POA: Diagnosis not present

## 2020-10-05 DIAGNOSIS — Z9221 Personal history of antineoplastic chemotherapy: Secondary | ICD-10-CM | POA: Insufficient documentation

## 2020-10-05 MED ORDER — SODIUM CHLORIDE 0.9 % IV SOLN
Freq: Once | INTRAVENOUS | Status: AC
  Filled 2020-10-05: qty 250

## 2020-10-05 MED ORDER — HEPARIN SOD (PORK) LOCK FLUSH 100 UNIT/ML IV SOLN
500.0000 [IU] | Freq: Once | INTRAVENOUS | Status: AC | PRN
  Administered 2020-10-05: 500 [IU]
  Filled 2020-10-05: qty 5

## 2020-10-05 MED ORDER — TRASTUZUMAB-ANNS CHEMO 150 MG IV SOLR
300.0000 mg | Freq: Once | INTRAVENOUS | Status: AC
  Administered 2020-10-05: 300 mg via INTRAVENOUS
  Filled 2020-10-05: qty 14.29

## 2020-10-05 MED ORDER — ACETAMINOPHEN 325 MG PO TABS
650.0000 mg | ORAL_TABLET | Freq: Once | ORAL | Status: AC
Start: 2020-10-05 — End: 2020-10-05
  Administered 2020-10-05: 650 mg via ORAL

## 2020-10-05 MED ORDER — ACETAMINOPHEN 325 MG PO TABS
ORAL_TABLET | ORAL | Status: AC
  Filled 2020-10-05: qty 2

## 2020-10-05 MED ORDER — SODIUM CHLORIDE 0.9% FLUSH
10.0000 mL | INTRAVENOUS | Status: DC | PRN
  Administered 2020-10-05: 10 mL
  Filled 2020-10-05: qty 10

## 2020-10-05 NOTE — Patient Instructions (Signed)
[facility] Discharge Instructions for Patients Receiving Chemotherapy  Today you received the following immunotherapy agent: Trastuzumab-anns (Kanjinti)  To help prevent nausea and vomiting after your treatment, we encourage you to take your nausea medication as directed by your MD.   If you develop nausea and vomiting that is not controlled by your nausea medication, call the clinic.   BELOW ARE SYMPTOMS THAT SHOULD BE REPORTED IMMEDIATELY:  *FEVER GREATER THAN 100.5 F  *CHILLS WITH OR WITHOUT FEVER  NAUSEA AND VOMITING THAT IS NOT CONTROLLED WITH YOUR NAUSEA MEDICATION  *UNUSUAL SHORTNESS OF BREATH  *UNUSUAL BRUISING OR BLEEDING  TENDERNESS IN MOUTH AND THROAT WITH OR WITHOUT PRESENCE OF ULCERS  *URINARY PROBLEMS  *BOWEL PROBLEMS  UNUSUAL RASH Items with * indicate a potential emergency and should be followed up as soon as possible.  Feel free to call the clinic should you have any questions or concerns. The clinic phone number is [phone].  Please show the [place] at check-in to the Emergency Department and triage nurse.

## 2020-10-05 NOTE — Progress Notes (Signed)
Per Dr. Lorenso Courier, ok for treatment today with ECHO from 05/16/20.

## 2020-10-13 DIAGNOSIS — C50912 Malignant neoplasm of unspecified site of left female breast: Secondary | ICD-10-CM | POA: Diagnosis not present

## 2020-10-25 NOTE — Progress Notes (Signed)
Patient Care Team: [name], MD as PCP - General (Family Medicine) [name], MD as PCP - Cardiology (Cardiology) [name], MD as Surgeon (General Surgery) [name], RN as Oncology Nurse Navigator Mauro Kaufmann, RN as Oncology Nurse Navigator  DIAGNOSIS:    ICD-10-CM   1. Malignant neoplasm of lower-outer quadrant of left breast of female, estrogen receptor positive ([place])  C50.512    Z17.0     SUMMARY OF ONCOLOGIC HISTORY: Oncology History  Malignant neoplasm of lower-outer quadrant of left breast of female, estrogen receptor positive ([place])  01/18/2020 Initial Diagnosis   Palpable left breast lump with calcifications at 5 o'clock position measuring 3.3 cm.  Biopsy revealed grade 3 IDC ER 95%, PR 0%, HER-2 3+ positive, Ki-67 50%, axilla negative, T2N0   01/26/2020 Cancer Staging   Staging form: Breast, AJCC 8th Edition - Clinical stage from 01/26/2020: Stage IIA (cT2, cN0, cM0, G3, ER+, PR-, HER2+) - Signed by Nicholas Lose, MD on 01/26/2020   02/16/2020 - 05/13/2020 Chemotherapy   The patient had PACLitaxel-protein bound (ABRAXANE) chemo infusion 125 mg, 80 mg/m2 = 125 mg (100 % of original dose 80 mg/m2), Intravenous,  Once, 3 of 3 cycles Dose modification: 80 mg/m2 (original dose 80 mg/m2, Cycle 2) Administration: 125 mg (03/22/2020), 125 mg (03/29/2020), 125 mg (04/06/2020), 125 mg (04/12/2020), 125 mg (04/20/2020), 125 mg (04/26/2020), 125 mg (05/05/2020), 125 mg (05/13/2020) PACLitaxel (TAXOL) 120 mg in sodium chloride 0.9 % 250 mL chemo infusion (</= 38m/m2), 80 mg/m2 = 120 mg, Intravenous,  Once, 1 of 1 cycle Administration: 120 mg (02/16/2020), 120 mg (02/23/2020), 120 mg (03/01/2020), 120 mg (03/08/2020) trastuzumab-anns (KANJINTI) 189 mg in sodium chloride 0.9 % 250 mL chemo infusion, 4 mg/kg = 189 mg, Intravenous,  Once, 3 of 3 cycles Dose modification: 6 mg/kg (original dose 2 mg/kg, Cycle 3, Reason: Other (see comments), Comment: maintenance  Trastuzumab to start) Administration: 189 mg (02/16/2020), 105 mg (02/23/2020), 105 mg (03/01/2020), 105 mg (03/08/2020), 105 mg (03/15/2020), 105 mg (03/22/2020), 105 mg (03/29/2020), 105 mg (04/06/2020), 105 mg (04/12/2020), 105 mg (04/20/2020), 105 mg (04/26/2020), 300 mg (05/05/2020)  for chemotherapy treatment.    05/31/2020 -  Chemotherapy      Patient is on Antibody Plan: BREAST TRASTUZUMAB Q21D    06/15/2020 Surgery   Left lumpectomy ([facility]): no residual carcinoma, 4 left axillary lymph nodes negative for carcinoma   07/24/2020 - 08/21/2020 Radiation Therapy   Adjuvant radiation     CHIEF COMPLIANT: Follow-up of left breast cancer on Herceptin  INTERVAL HISTORY: Rachel Valentine a 80y.o. with above-mentioned history of left breastcancerwho completedneoadjuvant chemotherapy,underwent a left lumpectomy, completed radiation, and is currently on Herceptin.She presents to the clinic todayfor treatment.   ALLERGIES:  is allergic to paclitaxel, fosamax [alendronate], prednisone, clindamycin/lincomycin, and penicillins.  MEDICATIONS:  Current Outpatient Medications  Medication Sig Dispense Refill   anastrozole (ARIMIDEX) 1 MG tablet Take 1 tablet (1 mg total) by mouth daily. 90 tablet 3   Biotin 10000 MCG TABS Take 10,000 mg by mouth daily.      calcium citrate (CALCITRATE - DOSED IN MG ELEMENTAL CALCIUM) 950 (200 Ca) MG tablet Take 200 mg of elemental calcium by mouth 2 (two) times daily.      Calcium-Phosphorus-Vitamin D (CITRACAL +D3 PO) Take 1 tablet by mouth in the morning and at bedtime.      diphenhydrAMINE (BENADRYL) 25 mg capsule Take 25 mg by mouth at bedtime as needed for allergies.  ipratropium (ATROVENT) 0.03 % nasal spray Place 2 sprays into both nostrils daily.     metoprolol succinate (TOPROL-XL) 25 MG 24 hr tablet Take 25 mg by mouth daily.     nitroGLYCERIN (NITROSTAT) 0.4 MG SL tablet Place 1 tablet (0.4 mg total) under the tongue every 5 (five)  minutes as needed for chest pain. (Patient not taking: Reported on 07/19/2020) 30 tablet 0   pantoprazole (PROTONIX) 40 MG tablet TAKE 1 TABLET BY MOUTH EVERY DAY 90 tablet 2   Propylene Glycol (SYSTANE BALANCE) 0.6 % SOLN Place 2 drops into both eyes daily as needed (Dry eyes).      rosuvastatin (CRESTOR) 40 MG tablet Take 40 mg by mouth daily.      No current facility-administered medications for this visit.   Facility-Administered Medications Ordered in Other Visits  Medication Dose Route Frequency Provider Last Rate Last Admin   sodium chloride flush (NS) 0.9 % injection 10 mL  10 mL Intravenous PRN Nicholas Lose, MD   10 mL at 10/26/20 1210    PHYSICAL EXAMINATION: ECOG PERFORMANCE STATUS: 1 - Symptomatic but completely ambulatory  Vitals:   10/26/20 1238  BP: (!) 118/52  Pulse: 81  Resp: 17  Temp: (!) 97.3 F (36.3 C)  SpO2: 100%   Filed Weights   10/26/20 1238  Weight: 104 lb 12.8 oz (47.5 kg)    LABORATORY DATA:  I have reviewed the data as listed CMP Latest Ref Rng & Units 09/14/2020 06/21/2020 06/09/2020  Glucose 70 - 99 mg/dL 104(H) 117(H) 96  BUN 8 - 23 mg/dL 15 16 16   Creatinine 0.44 - 1.00 mg/dL 0.81 0.79 0.70  Sodium 135 - 145 mmol/L 141 139 139  Potassium 3.5 - 5.1 mmol/L 4.0 4.4 4.4  Chloride 98 - 111 mmol/L 108 105 104  CO2 22 - 32 mmol/L 26 27 25   Calcium 8.9 - 10.3 mg/dL 9.3 9.4 9.7  Total Protein 6.5 - 8.1 g/dL 7.0 6.5 6.8  Total Bilirubin 0.3 - 1.2 mg/dL 0.4 0.3 0.2(L)  Alkaline Phos 38 - 126 U/L 98 84 79  AST 15 - 41 U/L 19 17 16   ALT 0 - 44 U/L 7 7 10     Lab Results  Component Value Date   WBC 6.7 10/26/2020   HGB 11.0 (L) 10/26/2020   HCT 34.0 (L) 10/26/2020   MCV 87.0 10/26/2020   PLT 255 10/26/2020   NEUTROABS 4.4 10/26/2020    ASSESSMENT & PLAN:  Malignant neoplasm of lower-outer quadrant of left breast of female, estrogen receptor positive ([place]) 01/18/2020:Palpable left breast lump with calcifications at 5 o'clock position  measuring 3.3 cm. Biopsy revealed grade 3 IDC ER 95%, PR 0%, HER-2 3+ positive, Ki-67 50%, axilla negative, T2N0 stage IIa clinical stage Bilateral implants removed 2005  Treatment plan: 1.Neo- adjuvant chemotherapy with Taxol Herceptin 2.Breast conserving surgery with sentinel lymph node biopsy 3.Adjuvant radiation therapy completed 08/21/2020 4.Adjuvant antiestrogen therapywith anastrozole 1 mg daily x5 years  Breast MRI: 2.3 cm mass, indeterminate 8 mm mass (Biopsy: ADH), no LN --------------------------------------------------------------------------------------------------------------------------------------------------------------- 06/15/20:Left lumpectomy ([facility]): no residual carcinoma, 4 left axillary lymph nodes negative for carcinoma  Plan: 1.Anti-HER-2 therapy to be completed 02/08/2021 2.adjuvant radiation therapy completed 08/21/2020 3.Adjuvant antiestrogen therapywith anastrozole 1 mg daily x5 years started January 2022)  Anastrozole toxicities : Denies any adverse effects to anastrozole.  Denies any hot flashes or myalgias.  Osteoporosis: In 2018 at bone density was T score -2.6:  Prolia injections every 6 months.    Every  3-week Herceptin will be continued. I will see the patient every 6 weeks.    No orders of the defined types were placed in this encounter.  The patient has a good understanding of the overall plan. she agrees with it. she will call with any problems that may develop before the next visit here.  Total time spent: 30 mins including face to face time and time spent for planning, charting and coordination of care  Rulon Eisenmenger, MD, MPH 10/26/2020  I, Cloyde Reams Dorshimer, am acting as scribe for Dr. Nicholas Lose.  I have reviewed the above documentation for accuracy and completeness, and I agree with the above.

## 2020-10-26 ENCOUNTER — Inpatient Hospital Stay: Payer: Medicare HMO

## 2020-10-26 ENCOUNTER — Encounter: Payer: Self-pay | Admitting: *Deleted

## 2020-10-26 ENCOUNTER — Inpatient Hospital Stay (HOSPITAL_BASED_OUTPATIENT_CLINIC_OR_DEPARTMENT_OTHER): Payer: Medicare HMO | Admitting: Hematology and Oncology

## 2020-10-26 ENCOUNTER — Other Ambulatory Visit: Payer: Self-pay

## 2020-10-26 DIAGNOSIS — C50512 Malignant neoplasm of lower-outer quadrant of left female breast: Secondary | ICD-10-CM

## 2020-10-26 DIAGNOSIS — M810 Age-related osteoporosis without current pathological fracture: Secondary | ICD-10-CM | POA: Diagnosis not present

## 2020-10-26 DIAGNOSIS — Z95828 Presence of other vascular implants and grafts: Secondary | ICD-10-CM

## 2020-10-26 DIAGNOSIS — Z170 Estrogen receptor positive status [ER+]: Secondary | ICD-10-CM | POA: Diagnosis not present

## 2020-10-26 DIAGNOSIS — Z79811 Long term (current) use of aromatase inhibitors: Secondary | ICD-10-CM | POA: Diagnosis not present

## 2020-10-26 DIAGNOSIS — Z9221 Personal history of antineoplastic chemotherapy: Secondary | ICD-10-CM | POA: Diagnosis not present

## 2020-10-26 DIAGNOSIS — Z923 Personal history of irradiation: Secondary | ICD-10-CM | POA: Diagnosis not present

## 2020-10-26 DIAGNOSIS — Z79899 Other long term (current) drug therapy: Secondary | ICD-10-CM | POA: Diagnosis not present

## 2020-10-26 DIAGNOSIS — Z5112 Encounter for antineoplastic immunotherapy: Secondary | ICD-10-CM | POA: Diagnosis not present

## 2020-10-26 LAB — CBC WITH DIFFERENTIAL (CANCER CENTER ONLY)
Abs Immature Granulocytes: 0.02 10*3/uL (ref 0.00–0.07)
Basophils Absolute: 0.1 10*3/uL (ref 0.0–0.1)
Basophils Relative: 1 %
Eosinophils Absolute: 0.6 10*3/uL — ABNORMAL HIGH (ref 0.0–0.5)
Eosinophils Relative: 10 %
HCT: 34 % — ABNORMAL LOW (ref 36.0–46.0)
Hemoglobin: 11 g/dL — ABNORMAL LOW (ref 12.0–15.0)
Immature Granulocytes: 0 %
Lymphocytes Relative: 15 %
Lymphs Abs: 1 10*3/uL (ref 0.7–4.0)
MCH: 28.1 pg (ref 26.0–34.0)
MCHC: 32.4 g/dL (ref 30.0–36.0)
MCV: 87 fL (ref 80.0–100.0)
Monocytes Absolute: 0.6 10*3/uL (ref 0.1–1.0)
Monocytes Relative: 8 %
Neutro Abs: 4.4 10*3/uL (ref 1.7–7.7)
Neutrophils Relative %: 66 %
Platelet Count: 255 10*3/uL (ref 150–400)
RBC: 3.91 MIL/uL (ref 3.87–5.11)
RDW: 15.8 % — ABNORMAL HIGH (ref 11.5–15.5)
WBC Count: 6.7 10*3/uL (ref 4.0–10.5)
nRBC: 0 % (ref 0.0–0.2)

## 2020-10-26 LAB — CMP (CANCER CENTER ONLY)
ALT: 7 U/L (ref 0–44)
AST: 17 U/L (ref 15–41)
Albumin: 3.8 g/dL (ref 3.5–5.0)
Alkaline Phosphatase: 80 U/L (ref 38–126)
Anion gap: 6 (ref 5–15)
BUN: 20 mg/dL (ref 8–23)
CO2: 26 mmol/L (ref 22–32)
Calcium: 9 mg/dL (ref 8.9–10.3)
Chloride: 106 mmol/L (ref 98–111)
Creatinine: 0.86 mg/dL (ref 0.44–1.00)
GFR, Estimated: >60 mL/min (ref >60)
Glucose, Bld: 114 mg/dL — ABNORMAL HIGH (ref 70–99)
Potassium: 3.8 mmol/L (ref 3.5–5.1)
Sodium: 138 mmol/L (ref 135–145)
Total Bilirubin: 0.4 mg/dL (ref 0.3–1.2)
Total Protein: 6.7 g/dL (ref 6.5–8.1)

## 2020-10-26 MED ORDER — SODIUM CHLORIDE 0.9% FLUSH
10.0000 mL | INTRAVENOUS | Status: DC | PRN
  Administered 2020-10-26: 10 mL
  Filled 2020-10-26: qty 10

## 2020-10-26 MED ORDER — SODIUM CHLORIDE 0.9% FLUSH
10.0000 mL | INTRAVENOUS | Status: DC | PRN
  Administered 2020-10-26: 10 mL via INTRAVENOUS
  Filled 2020-10-26: qty 10

## 2020-10-26 MED ORDER — TRASTUZUMAB-ANNS CHEMO 150 MG IV SOLR
300.0000 mg | Freq: Once | INTRAVENOUS | Status: AC
  Administered 2020-10-26: 300 mg via INTRAVENOUS
  Filled 2020-10-26: qty 14.29

## 2020-10-26 MED ORDER — ACETAMINOPHEN 325 MG PO TABS
ORAL_TABLET | ORAL | Status: AC
  Filled 2020-10-26: qty 2

## 2020-10-26 MED ORDER — HEPARIN SOD (PORK) LOCK FLUSH 100 UNIT/ML IV SOLN
500.0000 [IU] | Freq: Once | INTRAVENOUS | Status: AC | PRN
  Administered 2020-10-26: 500 [IU]
  Filled 2020-10-26: qty 5

## 2020-10-26 MED ORDER — ACETAMINOPHEN 325 MG PO TABS
650.0000 mg | ORAL_TABLET | Freq: Once | ORAL | Status: AC
Start: 2020-10-26 — End: 2020-10-26
  Administered 2020-10-26: 650 mg via ORAL

## 2020-10-26 MED ORDER — SODIUM CHLORIDE 0.9 % IV SOLN
Freq: Once | INTRAVENOUS | Status: AC
  Filled 2020-10-26: qty 250

## 2020-10-26 NOTE — Patient Instructions (Signed)
[facility] Discharge Instructions for Patients Receiving Chemotherapy  Today you received the following chemotherapy agents Traztuzumab  To help prevent nausea and vomiting after your treatment, we encourage you to take your nausea medication as directed.    If you develop nausea and vomiting that is not controlled by your nausea medication, call the clinic.   BELOW ARE SYMPTOMS THAT SHOULD BE REPORTED IMMEDIATELY:  *FEVER GREATER THAN 100.5 F  *CHILLS WITH OR WITHOUT FEVER  NAUSEA AND VOMITING THAT IS NOT CONTROLLED WITH YOUR NAUSEA MEDICATION  *UNUSUAL SHORTNESS OF BREATH  *UNUSUAL BRUISING OR BLEEDING  TENDERNESS IN MOUTH AND THROAT WITH OR WITHOUT PRESENCE OF ULCERS  *URINARY PROBLEMS  *BOWEL PROBLEMS  UNUSUAL RASH Items with * indicate a potential emergency and should be followed up as soon as possible.  Feel free to call the clinic should you have any questions or concerns. The clinic phone number is [phone].  Please show the CHEMO ALERT CARD at check-in to the Emergency Department and triage nurse.   

## 2020-10-26 NOTE — Assessment & Plan Note (Signed)
01/18/2020:Palpable left breast lump with calcifications at 5 o'clock position measuring 3.3 cm. Biopsy revealed grade 3 IDC ER 95%, PR 0%, HER-2 3+ positive, Ki-67 50%, axilla negative, T2N0 stage IIa clinical stage Bilateral implants removed 2005  Treatment plan: 1.Neo- adjuvant chemotherapy with Taxol Herceptin 2.Breast conserving surgery with sentinel lymph node biopsy 3.Adjuvant radiation therapy completed 08/21/2020 4.Adjuvant antiestrogen therapywith anastrozole 1 mg daily x5 years  Breast MRI: 2.3 cm mass, indeterminate 8 mm mass (Biopsy: ADH), no LN --------------------------------------------------------------------------------------------------------------------------------------------------------------- 06/15/20:Left lumpectomy ([facility]): no residual carcinoma, 4 left axillary lymph nodes negative for carcinoma  Plan: 1.Anti-HER-2 therapy to be completed 02/08/2021 2.adjuvant radiation therapy completed 08/21/2020 3.Adjuvant antiestrogen therapywith anastrozole 1 mg daily x5 years started January 2022)  Anastrozole toxicities :   Osteoporosis: In 2018 at bone density was T score -2.6:  Prolia injections every 6 months.    Every 3-week Herceptin will be continued. I will see the patient every 6 weeks.

## 2020-10-26 NOTE — Patient Instructions (Signed)

## 2020-10-31 ENCOUNTER — Other Ambulatory Visit: Payer: Self-pay

## 2020-10-31 ENCOUNTER — Encounter: Payer: Self-pay | Admitting: Cardiovascular Disease

## 2020-10-31 ENCOUNTER — Ambulatory Visit: Payer: Medicare HMO | Admitting: Cardiovascular Disease

## 2020-10-31 VITALS — BP 148/60 | HR 64 | Ht 62.0 in | Wt 105.0 lb

## 2020-10-31 DIAGNOSIS — I2510 Atherosclerotic heart disease of native coronary artery without angina pectoris: Secondary | ICD-10-CM | POA: Diagnosis not present

## 2020-10-31 DIAGNOSIS — Z9861 Coronary angioplasty status: Secondary | ICD-10-CM

## 2020-10-31 DIAGNOSIS — R030 Elevated blood-pressure reading, without diagnosis of hypertension: Secondary | ICD-10-CM

## 2020-10-31 DIAGNOSIS — E785 Hyperlipidemia, unspecified: Secondary | ICD-10-CM

## 2020-10-31 LAB — LIPID PANEL
Chol/HDL Ratio: 2 ratio (ref 0.0–4.4)
Cholesterol, Total: 141 mg/dL (ref 100–199)
HDL: 71 mg/dL (ref >39)
LDL Chol Calc (NIH): 53 mg/dL (ref 0–99)
Triglycerides: 95 mg/dL (ref 0–149)
VLDL Cholesterol Cal: 17 mg/dL (ref 5–40)

## 2020-10-31 LAB — HEPATIC FUNCTION PANEL
ALT: 7 IU/L (ref 0–32)
AST: 20 IU/L (ref 0–40)
Albumin: 4.8 g/dL — ABNORMAL HIGH (ref 3.7–4.7)
Alkaline Phosphatase: 93 IU/L (ref 44–121)
Bilirubin Total: 0.3 mg/dL (ref 0.0–1.2)
Bilirubin, Direct: 0.11 mg/dL (ref 0.00–0.40)
Total Protein: 6.8 g/dL (ref 6.0–8.5)

## 2020-10-31 NOTE — Assessment & Plan Note (Signed)
History of CAD status post cardiac catheterization Dr. Ellyn Hack 01/04/2019 revealing normal LV function, high-grade mid AV groove circumflex disease and high-grade diffuse calcified RCA stenosis with normal LV function.  She underwent LAD intervention by Dr. Ellyn Hack with staged atherectomy, PCI and stenting of the proximal mid distal RCA by Dr. Angelena Form 01/06/2019.  I catheterized her 06/24/2019 revealing high-grade in-stent restenosis within the RCA stent which I was unable to cross.  She also underwent CABG x1 with a vein graft to the distal RCA by Dr. Cyndia Bent during the same hospitalization and was discharged home 07/02/2019.  She has remained asymptomatic.

## 2020-10-31 NOTE — Patient Instructions (Signed)
Medication Instructions:   Start taking metoprolol succinate (TOPROL-XL) 25mg  once daily.  *If you need a refill on your cardiac medications before your next appointment, please call your pharmacy*   Lab Work: Your physician recommends that you have labs drawn today: Lipid/liver profile.  If you have labs (blood work) drawn today and your tests are completely normal, you will receive your results only by: Marland Kitchen MyChart Message (if you have MyChart) OR . A paper copy in the mail If you have any lab test that is abnormal or we need to change your treatment, we will call you to review the results.   Follow-Up: At [facility], you and your health needs are our priority.  As part of our continuing mission to provide you with exceptional heart care, we have created designated Provider Care Teams.  These Care Teams include your primary Cardiologist (physician) and Advanced Practice Providers (APPs -  Physician Assistants and Nurse Practitioners) who all work together to provide you with the care you need, when you need it.  We recommend signing up for the patient portal called "MyChart".  Sign up information is provided on this After Visit Summary.  MyChart is used to connect with patients for Virtual Visits (Telemedicine).  Patients are able to view lab/test results, encounter notes, upcoming appointments, etc.  Non-urgent messages can be sent to your provider as well.   To learn more about what you can do with MyChart, go to [email or web address].    Your next appointment:   6 month(s)  The format for your next appointment:   In Person  Provider:   Quay Burow, MD

## 2020-10-31 NOTE — Assessment & Plan Note (Signed)
History of hyperlipidemia on high-dose rosuvastatin with lipid profile performed 06/27/2019 revealing cholesterol 104, LDL 41 and HDL 49.  We will recheck a lipid liver profile this morning.

## 2020-10-31 NOTE — Assessment & Plan Note (Signed)
History of essential hypertension a blood pressure measured today 148/60.  She is on metoprolol.

## 2020-10-31 NOTE — Progress Notes (Signed)
10/31/2020 [place]   23-Jul-1941  [id number]  Primary Physician [name], MD Primary Cardiologist: [name] MD Garret Reddish, [place], [place]  HPI:  Rachel Valentine is a 80 y.o.  widowed Caucasian female mother of 2, grandmother and one grandchild referred to me initially by Dr. Hosie Poisson, her prior PCP. 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 She was having palpitations and chest pain. Her risk factors include hyperlipidemia and family history with a mother who had CABG. She is never had a heart attack or stroke. She was admitted to [facility] on 01/01/2023 unstable angina. She ruled out for myocardial infarction. She underwent cardiac catheterization by Dr. Ellyn Hack 01/04/2019 revealing normal LV function, high-grade mid AV groove circumflex on a band and high-grade diffuse calcified RCA stenosis with a fairly normal LAD. She underwent LAD intervention by Dr. Ellyn Hack with staged diamondback orbital rotational atherectomy, PCI and drug-eluting stenting of the proximal mid and distal RCA by Dr. Angelena Form on 01/06/2019. She was discharged home the following day. She is felt well and denies chest pain. She is on aspirin and Plavix as well as high-dose atorvastatin and a beta-blocker. She is a fairly active exerciserand currently is back walking 2.5 to 4 miles a day 6 to 7 days a week without limitation.  She underwent outpatient radial diagnostic coronary angiography by myself 06/24/2019 revealing high-grade in-stent restenosis within in the previously placed RCA stents which I was unable to cross. Based on this she underwent CABG x1 with a vein graft to the distal RCA by Dr. Cyndia Bent during the same hospitalization and was discharged home on 07/02/2019. She is done well since. She gets occasional early evening palpitations.  She has  been diagnosed with breast cancer and has finished her chemotherapy and radiation therapy.  She currently is on Herceptin and has 5 sessions left.   She had developed gross hematuria for unclear reasons being worked up by Dr. Jeffie Pollock at [facility] urology.  She is had symptomatic anemia requiring transfusion of 2 units of packed red blood cells.  Hematuria has resolved.  Since I saw her 6 months ago she has complained of occasional palpitations and atypical chest pain.  She wonders whether this is related to the Herceptin treatment.    No outpatient medications have been marked as taking for the 10/31/20 encounter (Office Visit) with [name], MD.     Allergies  Allergen Reactions  . Paclitaxel Rash  . Fosamax [Alendronate] Nausea Only    Unset stomach  . Prednisone Other (See Comments)    Blood pressure high when she takes it  . Clindamycin/Lincomycin Rash  . Penicillins Rash    Did it involve swelling of the face/tongue/throat, SOB, or low BP? No Did it involve sudden or severe rash/hives, skin peeling, or any reaction on the inside of your mouth or nose? No Did you need to seek medical attention at a hospital or doctor's office? Yes When did it last happen?Childhood If all above answers are "NO", may proceed with cephalosporin use.    Social History   Socioeconomic History  . Marital status: Widowed    Spouse name: Not on file  . Number of children: Not on file  . Years of education: Not on file  . Highest education level: Not on file  Occupational History  . Not on file  Tobacco Use  .  Smoking status: Former Smoker    Quit date: 01/06/1999    Years since quitting: 21.8  . Smokeless tobacco: Never Used  Vaping Use  . Vaping Use: Never used  Substance and Sexual Activity  . Alcohol use: No  . Drug use: Never  . Sexual activity: Not on file  Other Topics Concern  . Not on file  Social History Narrative  . Not on file   Social Determinants of Health    Financial Resource Strain: Not on file  Food Insecurity: Not on file  Transportation Needs: Not on file  Physical Activity: Not on file  Stress: Not on file  Social Connections: Not on file  Intimate Partner Violence: Not on file     Review of Systems: General: negative for chills, fever, night sweats or weight changes.  Cardiovascular: negative for chest pain, dyspnea on exertion, edema, orthopnea, palpitations, paroxysmal nocturnal dyspnea or shortness of breath Dermatological: negative for rash Respiratory: negative for cough or wheezing Urologic: negative for hematuria Abdominal: negative for nausea, vomiting, diarrhea, bright red blood per rectum, melena, or hematemesis Neurologic: negative for visual changes, syncope, or dizziness All other systems reviewed and are otherwise negative except as noted above.    Blood pressure (!) 148/60, pulse 64, height 5\' 2"  (1.575 m), weight 105 lb (47.6 kg), SpO2 97 %.  General appearance: alert and no distress Neck: no adenopathy, no carotid bruit, no JVD, supple, symmetrical, trachea midline and thyroid not enlarged, symmetric, no tenderness/mass/nodules Lungs: clear to auscultation bilaterally Heart: regular rate and rhythm, S1, S2 normal, no murmur, click, rub or gallop Extremities: extremities normal, atraumatic, no cyanosis or edema Pulses: 2+ and symmetric Skin: Skin color, texture, turgor normal. No rashes or lesions Neurologic: Alert and oriented X 3, normal strength and tone. Normal symmetric reflexes. Normal coordination and gait  EKG sinus rhythm at 64 without ST or T wave changes.  Personally reviewed this EKG.  ASSESSMENT AND PLAN:   CAD S/P percutaneous coronary angioplasty History of CAD status post cardiac catheterization Dr. Ellyn Hack 01/04/2019 revealing normal LV function, high-grade mid AV groove circumflex disease and high-grade diffuse calcified RCA stenosis with normal LV function.  She underwent LAD intervention by  Dr. Ellyn Hack with staged atherectomy, PCI and stenting of the proximal mid distal RCA by Dr. Angelena Form 01/06/2019.  I catheterized her 06/24/2019 revealing high-grade in-stent restenosis within the RCA stent which I was unable to cross.  She also underwent CABG x1 with a vein graft to the distal RCA by Dr. Cyndia Bent during the same hospitalization and was discharged home 07/02/2019.  She has remained asymptomatic.  Elevated BP without diagnosis of hypertension History of essential hypertension a blood pressure measured today 148/60.  She is on metoprolol.  Dyslipidemia, goal LDL below 70 History of hyperlipidemia on high-dose rosuvastatin with lipid profile performed 06/27/2019 revealing cholesterol 104, LDL 41 and HDL 49.  We will recheck a lipid liver profile this morning.      [name] MD FACP,FACC,FAHA, [facility] 10/31/2020 10:16 AM

## 2020-11-02 ENCOUNTER — Telehealth: Payer: Self-pay | Admitting: Cardiovascular Disease

## 2020-11-02 MED ORDER — METOPROLOL SUCCINATE ER 25 MG PO TB24: 25.0000 mg | ORAL_TABLET | Freq: Every day | ORAL | 3 refills | Status: DC

## 2020-11-02 NOTE — Telephone Encounter (Signed)
Refill sent to the pharmacy electronically.  

## 2020-11-02 NOTE — Telephone Encounter (Signed)
Pt c/o medication issue:  1. Name of Medication: metoprolol succinate (TOPROL-XL) 25 MG 24 hr tablet  2. How are you currently taking this medication (dosage and times per day)?   3. Are you having a reaction (difficulty breathing--STAT)?   4. What is your medication issue? Patient said Dr. Gwenlyn Found changed the medication from 2x daily to 1x daily. The new rx has not been sent to her pharmacy yet and she was just following up.  Please send rx [id number] - [place], [place] - [address] AT [address]

## 2020-11-03 ENCOUNTER — Other Ambulatory Visit: Payer: Self-pay

## 2020-11-03 ENCOUNTER — Ambulatory Visit (HOSPITAL_COMMUNITY)
Admission: RE | Admit: 2020-11-03 | Discharge: 2020-11-03 | Disposition: A | Discharge status: Home / Self Care | Payer: Medicare HMO | Source: Ambulatory Visit | Attending: Hematology and Oncology | Admitting: Hematology and Oncology

## 2020-11-03 DIAGNOSIS — C50512 Malignant neoplasm of lower-outer quadrant of left female breast: Secondary | ICD-10-CM | POA: Diagnosis not present

## 2020-11-03 DIAGNOSIS — Z170 Estrogen receptor positive status [ER+]: Secondary | ICD-10-CM | POA: Diagnosis not present

## 2020-11-03 DIAGNOSIS — I340 Nonrheumatic mitral (valve) insufficiency: Secondary | ICD-10-CM

## 2020-11-03 DIAGNOSIS — I083 Combined rheumatic disorders of mitral, aortic and tricuspid valves: Secondary | ICD-10-CM | POA: Diagnosis not present

## 2020-11-03 DIAGNOSIS — Z951 Presence of aortocoronary bypass graft: Secondary | ICD-10-CM | POA: Diagnosis not present

## 2020-11-03 DIAGNOSIS — E785 Hyperlipidemia, unspecified: Secondary | ICD-10-CM | POA: Insufficient documentation

## 2020-11-03 DIAGNOSIS — I700 Atherosclerosis of aorta: Secondary | ICD-10-CM | POA: Diagnosis not present

## 2020-11-03 DIAGNOSIS — I2510 Atherosclerotic heart disease of native coronary artery without angina pectoris: Secondary | ICD-10-CM | POA: Diagnosis not present

## 2020-11-03 LAB — ECHOCARDIOGRAM COMPLETE
AR max vel: 1.64 cm2
AV Area VTI: 1.52 cm2
AV Area mean vel: 1.36 cm2
AV Mean grad: 8 mmHg
AV Peak grad: 12.4 mmHg
Ao pk vel: 1.76 m/s
Area-P 1/2: 2.95 cm2
P 1/2 time: 354 msec
S' Lateral: 2.6 cm

## 2020-11-03 NOTE — Progress Notes (Signed)
  Echocardiogram 2D Echocardiogram has been performed.  Matilde Bash 11/03/2020, 10:28 AM

## 2020-11-08 ENCOUNTER — Encounter: Payer: Self-pay | Admitting: Hematology and Oncology

## 2020-11-16 ENCOUNTER — Other Ambulatory Visit: Payer: Self-pay

## 2020-11-16 ENCOUNTER — Inpatient Hospital Stay: Payer: Medicare HMO | Attending: Hematology and Oncology

## 2020-11-16 VITALS — BP 151/53 | HR 65 | Temp 97.5°F | Resp 16

## 2020-11-16 DIAGNOSIS — Z170 Estrogen receptor positive status [ER+]: Secondary | ICD-10-CM | POA: Insufficient documentation

## 2020-11-16 DIAGNOSIS — C50512 Malignant neoplasm of lower-outer quadrant of left female breast: Secondary | ICD-10-CM | POA: Diagnosis not present

## 2020-11-16 DIAGNOSIS — Z5112 Encounter for antineoplastic immunotherapy: Secondary | ICD-10-CM | POA: Diagnosis not present

## 2020-11-16 DIAGNOSIS — Z923 Personal history of irradiation: Secondary | ICD-10-CM | POA: Insufficient documentation

## 2020-11-16 DIAGNOSIS — Z79811 Long term (current) use of aromatase inhibitors: Secondary | ICD-10-CM | POA: Diagnosis not present

## 2020-11-16 MED ORDER — ACETAMINOPHEN 325 MG PO TABS
650.0000 mg | ORAL_TABLET | Freq: Once | ORAL | Status: AC
  Administered 2020-11-16: 650 mg via ORAL

## 2020-11-16 MED ORDER — TRASTUZUMAB-ANNS CHEMO 150 MG IV SOLR
6.0000 mg/kg | Freq: Once | INTRAVENOUS | Status: AC
  Administered 2020-11-16: 273 mg via INTRAVENOUS
  Filled 2020-11-16: qty 13

## 2020-11-16 MED ORDER — SODIUM CHLORIDE 0.9 % IV SOLN
Freq: Once | INTRAVENOUS | Status: AC
  Filled 2020-11-16: qty 250

## 2020-11-16 MED ORDER — SODIUM CHLORIDE 0.9% FLUSH
10.0000 mL | INTRAVENOUS | Status: DC | PRN
  Administered 2020-11-16: 10 mL
  Filled 2020-11-16: qty 10

## 2020-11-16 MED ORDER — ACETAMINOPHEN 325 MG PO TABS
ORAL_TABLET | ORAL | Status: AC
  Filled 2020-11-16: qty 2

## 2020-11-16 MED ORDER — HEPARIN SOD (PORK) LOCK FLUSH 100 UNIT/ML IV SOLN
500.0000 [IU] | Freq: Once | INTRAVENOUS | Status: AC | PRN
  Administered 2020-11-16: 500 [IU]
  Filled 2020-11-16: qty 5

## 2020-12-03 NOTE — Progress Notes (Signed)
Patient Care Team: [name], MD as PCP - General (Family Medicine) [name], MD as PCP - Cardiology (Cardiology) [name], MD as Surgeon (General Surgery) [name], RN as Oncology Nurse Navigator Mauro Kaufmann, RN as Oncology Nurse Navigator  DIAGNOSIS:    ICD-10-CM   1. Malignant neoplasm of lower-outer quadrant of left breast of female, estrogen receptor positive ([place])  C50.512    Z17.0     SUMMARY OF ONCOLOGIC HISTORY: Oncology History  Malignant neoplasm of lower-outer quadrant of left breast of female, estrogen receptor positive ([place])  01/18/2020 Initial Diagnosis   Palpable left breast lump with calcifications at 5 o'clock position measuring 3.3 cm.  Biopsy revealed grade 3 IDC ER 95%, PR 0%, HER-2 3+ positive, Ki-67 50%, axilla negative, T2N0   01/26/2020 Cancer Staging   Staging form: Breast, AJCC 8th Edition - Clinical stage from 01/26/2020: Stage IIA (cT2, cN0, cM0, G3, ER+, PR-, HER2+) - Signed by Nicholas Lose, MD on 01/26/2020   02/16/2020 - 05/13/2020 Chemotherapy   The patient had PACLitaxel-protein bound (ABRAXANE) chemo infusion 125 mg, 80 mg/m2 = 125 mg (100 % of original dose 80 mg/m2), Intravenous,  Once, 3 of 3 cycles Dose modification: 80 mg/m2 (original dose 80 mg/m2, Cycle 2) Administration: 125 mg (03/22/2020), 125 mg (03/29/2020), 125 mg (04/06/2020), 125 mg (04/12/2020), 125 mg (04/20/2020), 125 mg (04/26/2020), 125 mg (05/05/2020), 125 mg (05/13/2020) PACLitaxel (TAXOL) 120 mg in sodium chloride 0.9 % 250 mL chemo infusion (</= 26m/m2), 80 mg/m2 = 120 mg, Intravenous,  Once, 1 of 1 cycle Administration: 120 mg (02/16/2020), 120 mg (02/23/2020), 120 mg (03/01/2020), 120 mg (03/08/2020) trastuzumab-anns (KANJINTI) 189 mg in sodium chloride 0.9 % 250 mL chemo infusion, 4 mg/kg = 189 mg, Intravenous,  Once, 3 of 3 cycles Dose modification: 6 mg/kg (original dose 2 mg/kg, Cycle 3, Reason: Other (see comments), Comment: maintenance  Trastuzumab to start) Administration: 189 mg (02/16/2020), 105 mg (02/23/2020), 105 mg (03/01/2020), 105 mg (03/08/2020), 105 mg (03/15/2020), 105 mg (03/22/2020), 105 mg (03/29/2020), 105 mg (04/06/2020), 105 mg (04/12/2020), 105 mg (04/20/2020), 105 mg (04/26/2020), 300 mg (05/05/2020)  for chemotherapy treatment.    05/31/2020 -  Chemotherapy      Patient is on Antibody Plan: BREAST TRASTUZUMAB Q21D    06/15/2020 Surgery   Left lumpectomy ([facility]): no residual carcinoma, 4 left axillary lymph nodes negative for carcinoma   07/24/2020 - 08/21/2020 Radiation Therapy   Adjuvant radiation     CHIEF COMPLIANT: Follow-up of left breast canceron Herceptin, severe fatigue  INTERVAL HISTORY: Rachel Valentine a 80y.o. with above-mentioned history of left breastcancerwho completedneoadjuvant chemotherapy,underwent a left lumpectomy,completed radiation,and is currently onHerceptin.Echo on 11/03/20 showed an ejection fraction of 55-60%. She presents to the clinic todayfor treatment. Her major complaint today is profound fatigue with started since last treatment.  It is unclear if it is related to anastrozole or Herceptin or recent allergy medication that she just started.  She just does not seem to have any energy to do any activities.  ALLERGIES:  is allergic to paclitaxel, fosamax [alendronate], prednisone, clindamycin/lincomycin, and penicillins.  MEDICATIONS:  Current Outpatient Medications  Medication Sig Dispense Refill  . anastrozole (ARIMIDEX) 1 MG tablet Take 1 tablet (1 mg total) by mouth daily. 90 tablet 3  . Biotin 10000 MCG TABS Take 10,000 mg by mouth daily.     . calcium citrate (CALCITRATE - DOSED IN MG ELEMENTAL CALCIUM) 950 (200 Ca) MG tablet Take 200 mg of  elemental calcium by mouth 2 (two) times daily.     . Calcium-Phosphorus-Vitamin D (CITRACAL +D3 PO) Take 1 tablet by mouth in the morning and at bedtime.     . diphenhydrAMINE (BENADRYL) 25 mg capsule Take 25 mg by  mouth at bedtime as needed for allergies.     Marland Kitchen ipratropium (ATROVENT) 0.03 % nasal spray Place 2 sprays into both nostrils daily.    . metoprolol succinate (TOPROL-XL) 25 MG 24 hr tablet Take 1 tablet (25 mg total) by mouth daily. 90 tablet 3  . nitroGLYCERIN (NITROSTAT) 0.4 MG SL tablet Place 1 tablet (0.4 mg total) under the tongue every 5 (five) minutes as needed for chest pain. (Patient not taking: Reported on 07/19/2020) 30 tablet 0  . pantoprazole (PROTONIX) 40 MG tablet TAKE 1 TABLET BY MOUTH EVERY DAY 90 tablet 2  . Propylene Glycol (SYSTANE BALANCE) 0.6 % SOLN Place 2 drops into both eyes daily as needed (Dry eyes).     . rosuvastatin (CRESTOR) 40 MG tablet Take 40 mg by mouth daily.      No current facility-administered medications for this visit.    PHYSICAL EXAMINATION: ECOG PERFORMANCE STATUS: 2 - Symptomatic, <50% confined to bed  Vitals:   12/04/20 1426  BP: (!) 140/56  Pulse: 61  Resp: 18  Temp: (!) 97.2 F (36.2 C)  SpO2: 100%   Filed Weights   12/04/20 1426  Weight: 104 lb 11.2 oz (47.5 kg)    LABORATORY DATA:  I have reviewed the data as listed CMP Latest Ref Rng & Units 10/31/2020 10/26/2020 09/14/2020  Glucose 70 - 99 mg/dL - 114(H) 104(H)  BUN 8 - 23 mg/dL - 20 15  Creatinine 0.44 - 1.00 mg/dL - 0.86 0.81  Sodium 135 - 145 mmol/L - 138 141  Potassium 3.5 - 5.1 mmol/L - 3.8 4.0  Chloride 98 - 111 mmol/L - 106 108  CO2 22 - 32 mmol/L - 26 26  Calcium 8.9 - 10.3 mg/dL - 9.0 9.3  Total Protein 6.0 - 8.5 g/dL 6.8 6.7 7.0  Total Bilirubin 0.0 - 1.2 mg/dL 0.3 0.4 0.4  Alkaline Phos 44 - 121 IU/L 93 80 98  AST 0 - 40 IU/L _0 ALT 0 - 32 IU/L _1 Lab Results  Component Value Date   WBC 7.7 12/04/2020   HGB 11.7 (L) 12/04/2020   HCT 36.5 12/04/2020   MCV 90.3 12/04/2020   PLT 243 12/04/2020   NEUTROABS 4.8 12/04/2020    ASSESSMENT & PLAN:  Malignant neoplasm of lower-outer quadrant of left breast of female, estrogen receptor positive  ([place]) 01/18/2020:Palpable left breast lump with calcifications at 5 o'clock position measuring 3.3 cm. Biopsy revealed grade 3 IDC ER 95%, PR 0%, HER-2 3+ positive, Ki-67 50%, axilla negative, T2N0 stage IIa clinical stage Bilateral implants removed 2005  Treatment plan: 1.Neo- adjuvant chemotherapy with Taxol Herceptin 2.Breast conserving surgery with sentinel lymph node biopsy 3.Adjuvant radiation therapycompleted 08/21/2020 4.Adjuvant antiestrogen therapywith anastrozole 1 mg daily x5 years  Breast MRI: 2.3 cm mass, indeterminate 8 mm mass (Biopsy: ADH), no LN --------------------------------------------------------------------------------------------------------------------------------------------------------------- 06/15/20:Left lumpectomy ([facility]): no residual carcinoma, 4 left axillary lymph nodes negative for carcinoma  Plan: 1.Anti-HER-2 therapyto be completed 02/08/2021 2.adjuvant radiation therapycompleted 08/21/2020 3.Adjuvant antiestrogen therapywith anastrozole 1 mg daily x5 yearsstartedJanuary 2022) --------------------------------------------------------------------------------------------------------------------  Anastrozole toxicities: Denies any adverse effects to anastrozole.  Denies any hot flashes or myalgias.  Osteoporosis: In 2018 at bone density was T score -2.6:  Prolia injections every 6 months.    Severe fatigue: It started since last 3 weeks.  She does not know what precipitated it.  She has been taking allergy pill lately. Because of the profound [place] of her fatigue we decided to cancel further Herceptin treatments.  She will also hold anastrozole.  She will take an nondrowsy allergy pill instead of the drowsy kind.  She has 4 more dose of Herceptin left and therefore we will cancel all future treatments. Return to clinic in 6 weeks to assess her symptoms and then discuss follow-up plan.  We hope to resume anastrozole at that time.   I requested Dr. Brantley Stage to remove her port.    No orders of the defined types were placed in this encounter.  The patient has a good understanding of the overall plan. she agrees with it. she will call with any problems that may develop before the next visit here.  Total time spent: 30 mins including face to face time and time spent for planning, charting and coordination of care  Rulon Eisenmenger, MD, MPH 12/04/2020  I, Cloyde Reams Dorshimer, am acting as scribe for Dr. Nicholas Lose.  I have reviewed the above documentation for accuracy and completeness, and I agree with the above.

## 2020-12-03 NOTE — Assessment & Plan Note (Signed)
01/18/2020:Palpable left breast lump with calcifications at 5 o'clock position measuring 3.3 cm. Biopsy revealed grade 3 IDC ER 95%, PR 0%, HER-2 3+ positive, Ki-67 50%, axilla negative, T2N0 stage IIa clinical stage Bilateral implants removed 2005  Treatment plan: 1.Neo- adjuvant chemotherapy with Taxol Herceptin 2.Breast conserving surgery with sentinel lymph node biopsy 3.Adjuvant radiation therapycompleted 08/21/2020 4.Adjuvant antiestrogen therapywith anastrozole 1 mg daily x5 years  Breast MRI: 2.3 cm mass, indeterminate 8 mm mass (Biopsy: ADH), no LN --------------------------------------------------------------------------------------------------------------------------------------------------------------- 06/15/20:Left lumpectomy ([facility]): no residual carcinoma, 4 left axillary lymph nodes negative for carcinoma  Plan: 1.Anti-HER-2 therapyto be completed 02/08/2021 2.adjuvant radiation therapycompleted 08/21/2020 3.Adjuvant antiestrogen therapywith anastrozole 1 mg daily x5 yearsstartedJanuary 2022) --------------------------------------------------------------------------------------------------------------------  Anastrozole toxicities: Denies any adverse effects to anastrozole.  Denies any hot flashes or myalgias.  Osteoporosis: In 2018 at bone density was T score -2.6:  Prolia injections every 6 months.    Every 3-week Herceptin will be continued. I will see the patient every 6 weeks.

## 2020-12-04 ENCOUNTER — Inpatient Hospital Stay: Payer: Medicare HMO

## 2020-12-04 ENCOUNTER — Encounter: Payer: Self-pay | Admitting: *Deleted

## 2020-12-04 ENCOUNTER — Inpatient Hospital Stay: Payer: Medicare HMO | Admitting: Hematology and Oncology

## 2020-12-04 ENCOUNTER — Other Ambulatory Visit: Payer: Self-pay

## 2020-12-04 ENCOUNTER — Inpatient Hospital Stay: Payer: Medicare HMO | Attending: Hematology and Oncology

## 2020-12-04 DIAGNOSIS — Z170 Estrogen receptor positive status [ER+]: Secondary | ICD-10-CM | POA: Diagnosis not present

## 2020-12-04 DIAGNOSIS — Z923 Personal history of irradiation: Secondary | ICD-10-CM | POA: Insufficient documentation

## 2020-12-04 DIAGNOSIS — C50512 Malignant neoplasm of lower-outer quadrant of left female breast: Secondary | ICD-10-CM | POA: Insufficient documentation

## 2020-12-04 DIAGNOSIS — Z95828 Presence of other vascular implants and grafts: Secondary | ICD-10-CM

## 2020-12-04 DIAGNOSIS — Z79811 Long term (current) use of aromatase inhibitors: Secondary | ICD-10-CM | POA: Diagnosis not present

## 2020-12-04 LAB — CBC WITH DIFFERENTIAL (CANCER CENTER ONLY)
Abs Immature Granulocytes: 0.02 10*3/uL (ref 0.00–0.07)
Basophils Absolute: 0.1 10*3/uL (ref 0.0–0.1)
Basophils Relative: 1 %
Eosinophils Absolute: 1 10*3/uL — ABNORMAL HIGH (ref 0.0–0.5)
Eosinophils Relative: 13 %
HCT: 36.5 % (ref 36.0–46.0)
Hemoglobin: 11.7 g/dL — ABNORMAL LOW (ref 12.0–15.0)
Immature Granulocytes: 0 %
Lymphocytes Relative: 16 %
Lymphs Abs: 1.2 10*3/uL (ref 0.7–4.0)
MCH: 29 pg (ref 26.0–34.0)
MCHC: 32.1 g/dL (ref 30.0–36.0)
MCV: 90.3 fL (ref 80.0–100.0)
Monocytes Absolute: 0.6 10*3/uL (ref 0.1–1.0)
Monocytes Relative: 8 %
Neutro Abs: 4.8 10*3/uL (ref 1.7–7.7)
Neutrophils Relative %: 62 %
Platelet Count: 243 10*3/uL (ref 150–400)
RBC: 4.04 MIL/uL (ref 3.87–5.11)
RDW: 15 % (ref 11.5–15.5)
WBC Count: 7.7 10*3/uL (ref 4.0–10.5)
nRBC: 0 % (ref 0.0–0.2)

## 2020-12-04 LAB — CMP (CANCER CENTER ONLY)
ALT: 11 U/L (ref 0–44)
AST: 21 U/L (ref 15–41)
Albumin: 4.1 g/dL (ref 3.5–5.0)
Alkaline Phosphatase: 77 U/L (ref 38–126)
Anion gap: 11 (ref 5–15)
BUN: 22 mg/dL (ref 8–23)
CO2: 24 mmol/L (ref 22–32)
Calcium: 8.7 mg/dL — ABNORMAL LOW (ref 8.9–10.3)
Chloride: 106 mmol/L (ref 98–111)
Creatinine: 0.79 mg/dL (ref 0.44–1.00)
GFR, Estimated: >60 mL/min (ref >60)
Glucose, Bld: 97 mg/dL (ref 70–99)
Potassium: 4.7 mmol/L (ref 3.5–5.1)
Sodium: 141 mmol/L (ref 135–145)
Total Bilirubin: 0.3 mg/dL (ref 0.3–1.2)
Total Protein: 6.9 g/dL (ref 6.5–8.1)

## 2020-12-04 MED ORDER — SODIUM CHLORIDE 0.9% FLUSH
10.0000 mL | Freq: Once | INTRAVENOUS | Status: AC
  Administered 2020-12-04: 10 mL
  Filled 2020-12-04: qty 10

## 2020-12-04 NOTE — Patient Instructions (Signed)
Implanted Port Insertion, Care After This sheet gives you information about how to care for yourself after your procedure. Your health care provider may also give you more specific instructions. If you have problems or questions, contact your health care provider. What can I expect after the procedure? After the procedure, it is common to have:  Discomfort at the port insertion site.  Bruising on the skin over the port. This should improve over 3-4 days. Follow these instructions at home: Port care  After your port is placed, you will get a manufacturer's information card. The card has information about your port. Keep this card with you at all times.  Take care of the port as told by your health care provider. Ask your health care provider if you or a family member can get training for taking care of the port at home. A home health care nurse may also take care of the port.  Make sure to remember what type of port you have. Incision care  Follow instructions from your health care provider about how to take care of your port insertion site. Make sure you: ? Wash your hands with soap and water before and after you change your bandage (dressing). If soap and water are not available, use hand sanitizer. ? Change your dressing as told by your health care provider. ? Leave stitches (sutures), skin glue, or adhesive strips in place. These skin closures may need to stay in place for 2 weeks or longer. If adhesive strip edges start to loosen and curl up, you may trim the loose edges. Do not remove adhesive strips completely unless your health care provider tells you to do that.  Check your port insertion site every day for signs of infection. Check for: ? Redness, swelling, or pain. ? Fluid or blood. ? Warmth. ? Pus or a bad smell.      Activity  Return to your normal activities as told by your health care provider. Ask your health care provider what activities are safe for you.  Do not  lift anything that is heavier than 10 lb (4.5 kg), or the limit that you are told, until your health care provider says that it is safe. General instructions  Take over-the-counter and prescription medicines only as told by your health care provider.  Do not take baths, swim, or use a hot tub until your health care provider approves. Ask your health care provider if you may take showers. You may only be allowed to take sponge baths.  Do not drive for 24 hours if you were given a sedative during your procedure.  Wear a medical alert bracelet in case of an emergency. This will tell any health care providers that you have a port.  Keep all follow-up visits as told by your health care provider. This is important. Contact a health care provider if:  You cannot flush your port with saline as directed, or you cannot draw blood from the port.  You have a fever or chills.  You have redness, swelling, or pain around your port insertion site.  You have fluid or blood coming from your port insertion site.  Your port insertion site feels warm to the touch.  You have pus or a bad smell coming from the port insertion site. Get help right away if:  You have chest pain or shortness of breath.  You have bleeding from your port that you cannot control. Summary  Take care of the port as told by your   health care provider. Keep the manufacturer's information card with you at all times.  Change your dressing as told by your health care provider.  Contact a health care provider if you have a fever or chills or if you have redness, swelling, or pain around your port insertion site.  Keep all follow-up visits as told by your health care provider. This information is not intended to replace advice given to you by your health care provider. Make sure you discuss any questions you have with your health care provider. Document Revised: 03/17/2018 Document Reviewed: 03/17/2018 Elsevier Patient Education   2021 Elsevier Inc.  

## 2020-12-06 ENCOUNTER — Telehealth: Payer: Self-pay | Admitting: Hematology and Oncology

## 2020-12-06 NOTE — Telephone Encounter (Signed)
Scheduled appt per 4/4 sch msg. Called pt, no answer. Left msg with appt date and time.

## 2020-12-07 ENCOUNTER — Other Ambulatory Visit: Payer: Medicare HMO

## 2020-12-07 ENCOUNTER — Ambulatory Visit: Payer: Medicare HMO | Admitting: Hematology and Oncology

## 2020-12-07 ENCOUNTER — Ambulatory Visit: Payer: Medicare HMO

## 2020-12-28 ENCOUNTER — Ambulatory Visit: Payer: Medicare HMO

## 2021-01-15 NOTE — Progress Notes (Signed)
Patient Care Team: [name], MD as PCP - General (Family Medicine) [name], MD as PCP - Cardiology (Cardiology) [name], MD as Surgeon (General Surgery) [name], RN as Oncology Nurse Navigator Mauro Kaufmann, RN as Oncology Nurse Navigator  DIAGNOSIS:    ICD-10-CM   1. Malignant neoplasm of lower-outer quadrant of left breast of female, estrogen receptor positive ([place])  C50.512    Z17.0     SUMMARY OF ONCOLOGIC HISTORY: Oncology History  Malignant neoplasm of lower-outer quadrant of left breast of female, estrogen receptor positive ([place])  01/18/2020 Initial Diagnosis   Palpable left breast lump with calcifications at 5 o'clock position measuring 3.3 cm.  Biopsy revealed grade 3 IDC ER 95%, PR 0%, HER-2 3+ positive, Ki-67 50%, axilla negative, T2N0   01/26/2020 Cancer Staging   Staging form: Breast, AJCC 8th Edition - Clinical stage from 01/26/2020: Stage IIA (cT2, cN0, cM0, G3, ER+, PR-, HER2+) - Signed by Nicholas Lose, MD on 01/26/2020   02/16/2020 - 05/13/2020 Chemotherapy   The patient had PACLitaxel-protein bound (ABRAXANE) chemo infusion 125 mg, 80 mg/m2 = 125 mg (100 % of original dose 80 mg/m2), Intravenous,  Once, 3 of 3 cycles Dose modification: 80 mg/m2 (original dose 80 mg/m2, Cycle 2) Administration: 125 mg (03/22/2020), 125 mg (03/29/2020), 125 mg (04/06/2020), 125 mg (04/12/2020), 125 mg (04/20/2020), 125 mg (04/26/2020), 125 mg (05/05/2020), 125 mg (05/13/2020) PACLitaxel (TAXOL) 120 mg in sodium chloride 0.9 % 250 mL chemo infusion (</= 32m/m2), 80 mg/m2 = 120 mg, Intravenous,  Once, 1 of 1 cycle Administration: 120 mg (02/16/2020), 120 mg (02/23/2020), 120 mg (03/01/2020), 120 mg (03/08/2020) trastuzumab-anns (KANJINTI) 189 mg in sodium chloride 0.9 % 250 mL chemo infusion, 4 mg/kg = 189 mg, Intravenous,  Once, 3 of 3 cycles Dose modification: 6 mg/kg (original dose 2 mg/kg, Cycle 3, Reason: Other (see comments), Comment: maintenance  Trastuzumab to start) Administration: 189 mg (02/16/2020), 105 mg (02/23/2020), 105 mg (03/01/2020), 105 mg (03/08/2020), 105 mg (03/15/2020), 105 mg (03/22/2020), 105 mg (03/29/2020), 105 mg (04/06/2020), 105 mg (04/12/2020), 105 mg (04/20/2020), 105 mg (04/26/2020), 300 mg (05/05/2020)  for chemotherapy treatment.    05/31/2020 - 11/16/2020 Chemotherapy         06/15/2020 Surgery   Left lumpectomy ([facility]): no residual carcinoma, 4 left axillary lymph nodes negative for carcinoma   07/24/2020 - 08/21/2020 Radiation Therapy   Adjuvant radiation     CHIEF COMPLIANT: Follow-up of left breast cancer  INTERVAL HISTORY: Rachel Naumanis a 80y.o. with above-mentioned history of left breastcancerwho completedneoadjuvant chemotherapy,underwent a left lumpectomy,completed radiation,and also ompletedHerceptin. She presents to the clinic todayfor follow up. Her energy levels have improved significantly.   ALLERGIES:  is allergic to paclitaxel, fosamax [alendronate], prednisone, clindamycin/lincomycin, and penicillins.  MEDICATIONS:  Current Outpatient Medications  Medication Sig Dispense Refill  . anastrozole (ARIMIDEX) 1 MG tablet Take 1 tablet (1 mg total) by mouth daily. 90 tablet 3  . Biotin 10000 MCG TABS Take 10,000 mg by mouth daily.     . calcium citrate (CALCITRATE - DOSED IN MG ELEMENTAL CALCIUM) 950 (200 Ca) MG tablet Take 200 mg of elemental calcium by mouth 2 (two) times daily.     . Calcium-Phosphorus-Vitamin D (CITRACAL +D3 PO) Take 1 tablet by mouth in the morning and at bedtime.     . diphenhydrAMINE (BENADRYL) 25 mg capsule Take 25 mg by mouth at bedtime as needed for allergies.     .Marland Kitchenipratropium (ATROVENT) 0.03 %  nasal spray Place 2 sprays into both nostrils daily.    . metoprolol succinate (TOPROL-XL) 25 MG 24 hr tablet Take 1 tablet (25 mg total) by mouth daily. 90 tablet 3  . nitroGLYCERIN (NITROSTAT) 0.4 MG SL tablet Place 1 tablet (0.4 mg total) under the tongue  every 5 (five) minutes as needed for chest pain. (Patient not taking: Reported on 07/19/2020) 30 tablet 0  . pantoprazole (PROTONIX) 40 MG tablet TAKE 1 TABLET BY MOUTH EVERY DAY 90 tablet 2  . Propylene Glycol (SYSTANE BALANCE) 0.6 % SOLN Place 2 drops into both eyes daily as needed (Dry eyes).     . rosuvastatin (CRESTOR) 40 MG tablet Take 40 mg by mouth daily.      No current facility-administered medications for this visit.    PHYSICAL EXAMINATION: ECOG PERFORMANCE STATUS: 1 - Symptomatic but completely ambulatory  Vitals:   01/16/21 1412  BP: (!) 136/53  Pulse: 83  Resp: 18  Temp: 97.9 F (36.6 C)  SpO2: 99%   Filed Weights   01/16/21 1412  Weight: 106 lb 4.8 oz (48.2 kg)    LABORATORY DATA:  I have reviewed the data as listed CMP Latest Ref Rng & Units 12/04/2020 10/31/2020 10/26/2020  Glucose 70 - 99 mg/dL 97 - 114(H)  BUN 8 - 23 mg/dL 22 - 20  Creatinine 0.44 - 1.00 mg/dL 0.79 - 0.86  Sodium 135 - 145 mmol/L 141 - 138  Potassium 3.5 - 5.1 mmol/L 4.7 - 3.8  Chloride 98 - 111 mmol/L 106 - 106  CO2 22 - 32 mmol/L 24 - 26  Calcium 8.9 - 10.3 mg/dL 8.7(L) - 9.0  Total Protein 6.5 - 8.1 g/dL 6.9 6.8 6.7  Total Bilirubin 0.3 - 1.2 mg/dL 0.3 0.3 0.4  Alkaline Phos 38 - 126 U/L 77 93 80  AST 15 - 41 U/L 21 20 17   ALT 0 - 44 U/L 11 7 7     Lab Results  Component Value Date   WBC 7.7 12/04/2020   HGB 11.7 (L) 12/04/2020   HCT 36.5 12/04/2020   MCV 90.3 12/04/2020   PLT 243 12/04/2020   NEUTROABS 4.8 12/04/2020    ASSESSMENT & PLAN:  Malignant neoplasm of lower-outer quadrant of left breast of female, estrogen receptor positive ([place]) 01/18/2020:Palpable left breast lump with calcifications at 5 o'clock position measuring 3.3 cm. Biopsy revealed grade 3 IDC ER 95%, PR 0%, HER-2 3+ positive, Ki-67 50%, axilla negative, T2N0 stage IIa clinical stage Bilateral implants removed 2005  Treatment plan: 1.Neo- adjuvant chemotherapy with Taxol Herceptin 2.Breast  conserving surgery with sentinel lymph node biopsy 3.Adjuvant radiation therapycompleted 08/21/2020 4.Adjuvant antiestrogen therapywith anastrozole 1 mg daily x5 years  Breast MRI: 2.3 cm mass, indeterminate 8 mm mass (Biopsy: ADH), no LN --------------------------------------------------------------------------------------------------------------------------------------------------------------- 06/15/20:Left lumpectomy ([facility]): no residual carcinoma, 4 left axillary lymph nodes negative for carcinoma  Plan: 1.Anti-HER-2 therapy 11/16/2020 (stopped early due to fatigue) 2.adjuvant radiation therapycompleted 08/21/2020 3.Adjuvant antiestrogen therapywith anastrozole 1 mg daily x5 yearsstartedJanuary 2022) --------------------------------------------------------------------------------------------------------------------  Anastrozoletoxicities:Denies any adverse effects to anastrozole. Denies any hot flashes or myalgias.  Osteoporosis: In 2018 at bone density was T score -2.6: Prolia injections every 6 months.   Severe fatigue: Improved since she stopped Herceptin treatment.  Return to clinic in 1 year for follow-up    No orders of the defined types were placed in this encounter.  The patient has a good understanding of the overall plan. she agrees with it. she will call with any problems that may  develop before the next visit here.  Total time spent:  mins including face to face time and time spent for planning, charting and coordination of care  Rulon Eisenmenger, MD, MPH 01/16/2021  I, Molly Dorshimer, am acting as scribe for Dr. Nicholas Lose.  I have reviewed the above documentation for accuracy and completeness, and I agree with the above.

## 2021-01-16 ENCOUNTER — Inpatient Hospital Stay: Payer: Medicare HMO | Attending: Hematology and Oncology | Admitting: Hematology and Oncology

## 2021-01-16 ENCOUNTER — Other Ambulatory Visit: Payer: Self-pay

## 2021-01-16 DIAGNOSIS — Z923 Personal history of irradiation: Secondary | ICD-10-CM | POA: Diagnosis not present

## 2021-01-16 DIAGNOSIS — C50512 Malignant neoplasm of lower-outer quadrant of left female breast: Secondary | ICD-10-CM

## 2021-01-16 DIAGNOSIS — Z79811 Long term (current) use of aromatase inhibitors: Secondary | ICD-10-CM | POA: Insufficient documentation

## 2021-01-16 DIAGNOSIS — Z170 Estrogen receptor positive status [ER+]: Secondary | ICD-10-CM

## 2021-01-16 NOTE — Assessment & Plan Note (Signed)
01/18/2020:Palpable left breast lump with calcifications at 5 o'clock position measuring 3.3 cm. Biopsy revealed grade 3 IDC ER 95%, PR 0%, HER-2 3+ positive, Ki-67 50%, axilla negative, T2N0 stage IIa clinical stage Bilateral implants removed 2005  Treatment plan: 1.Neo- adjuvant chemotherapy with Taxol Herceptin 2.Breast conserving surgery with sentinel lymph node biopsy 3.Adjuvant radiation therapycompleted 08/21/2020 4.Adjuvant antiestrogen therapywith anastrozole 1 mg daily x5 years  Breast MRI: 2.3 cm mass, indeterminate 8 mm mass (Biopsy: ADH), no LN --------------------------------------------------------------------------------------------------------------------------------------------------------------- 06/15/20:Left lumpectomy ([facility]): no residual carcinoma, 4 left axillary lymph nodes negative for carcinoma  Plan: 1.Anti-HER-2 therapy 11/16/2020 (stopped early due to fatigue) 2.adjuvant radiation therapycompleted 08/21/2020 3.Adjuvant antiestrogen therapywith anastrozole 1 mg daily x5 yearsstartedJanuary 2022) --------------------------------------------------------------------------------------------------------------------  Anastrozoletoxicities:Denies any adverse effects to anastrozole. Denies any hot flashes or myalgias.  Osteoporosis: In 2018 at bone density was T score -2.6: Prolia injections every 6 months.   Severe fatigue: Improved since she stopped Herceptin treatment.  Return to clinic in 1 year for follow-up

## 2021-01-22 ENCOUNTER — Ambulatory Visit: Payer: Self-pay | Admitting: Surgery

## 2021-01-22 DIAGNOSIS — Z95828 Presence of other vascular implants and grafts: Secondary | ICD-10-CM | POA: Diagnosis not present

## 2021-01-22 DIAGNOSIS — C50912 Malignant neoplasm of unspecified site of left female breast: Secondary | ICD-10-CM | POA: Diagnosis not present

## 2021-01-22 NOTE — H&P (View-Only) (Signed)
Rachel Valentine Appointment: 01/22/2021 10:20 AM Location: [facility] Patient #: [id number] DOB: 1941-06-27 Widowed / Language: Rachel Valentine / Race: White Female  History of Present Illness Rachel Valentine A. Rachel Passe MD; 01/22/2021 10:57 AM) Patient words: Patient returns for follow-up after treatment for breast cancer chemotherapy. She is done with report and desires removal. She has no other complaints.  The patient is a 80 year old female.   Allergies Rachel Valentine, CMA; 01/22/2021 10:04 AM) Clindamycin HCl *CHEMICALS* Rash. Fosamax *ENDOCRINE AND METABOLIC AGENTS - MISC.* Nausea. PACLitaxel *ANTINEOPLASTICS AND ADJUNCTIVE THERAPIES* Rash. Penicillins predniSONE *CORTICOSTEROIDS* Allergies Reconciled  Medication History Rachel Valentine, CMA; 01/22/2021 10:05 AM) Ondansetron HCl (8MG  Tablet, Oral) Active. Atorvastatin Calcium (80MG  Tablet, Oral) Active. Lidocaine-Prilocaine (2.5-2.5% Cream, External) Active. LORazepam (0.5MG  Tablet, Oral) Active. amLODIPine Besylate (2.5MG  Tablet, Oral) Active. Aspirin Low Dose (81MG  Tablet DR, Oral) Active. Rosuvastatin Calcium (40MG  Tablet, Oral) Active. Metoprolol Tartrate (25MG  Tablet, Oral) Active. Pantoprazole Sodium (40MG  Tablet DR, Oral) Active. Nitroglycerin (0.4MG  Tab Sublingual, Sublingual) Active. Citracal +D3 (Oral) Specific strength unknown - Active. Biotin (10000MCG Tablet, Oral) Active. Benadryl Allergy (25MG  Tablet, Oral) Active. Medications Reconciled    Vitals Rachel Valentine CMA; 01/22/2021 10:05 AM) 01/22/2021 10:05 AM Weight: 105.38 lb Height: 62in Body Surface Area: 1.46 m Body Mass Index: 19.27 kg/m  Temp.: 97.44F  Pulse: 85 (Regular)  P.OX: 96% (Room air) BP: 122/58(Sitting, Left Arm, Standard)        Physical Exam (Rachel Valentine A. Rachel Tkach MD; 01/22/2021 10:59 AM)  General Mental Status-Alert. General Appearance-Consistent with stated age. Hydration-Well  hydrated. Voice-Normal.  Chest and Lung Exam Note: port on right side upper chest noted  Breast Note: left breast post surgical changes noted  Musculoskeletal Normal Exam - Left-Upper Extremity Strength Normal and Lower Extremity Strength Normal. Normal Exam - Right-Upper Extremity Strength Normal and Lower Extremity Strength Normal.    Assessment & Plan (Rachel Valentine A. Rachel Pickup MD; 01/22/2021 11:01 AM)  BREAST CANCER, LEFT (C50.912) Impression: finished chemotherapy port removal  Current Plans I recommended surgery to remove the catheter. I explained the technique of removal with use of local anesthesia & possible need for more aggressive sedation/anesthesia for patient comfort.  Risks such as bleeding, infection, and other risks were discussed. Post-operative dressing/incision care was discussed. I noted a good likelihood this will help address the problem. We will work to minimize complications. Questions were answered. The patient expresses understanding & wishes to proceed with surgery.  Pt Education - CCS Free Text Education/Instructions: discussed with patient and provided information.  PORT-A-CATH IN PLACE [phone]) Impression: port removal  Current Plans Pt Education - CCS Free Text Education/Instructions: discussed with patient and provided information.

## 2021-01-22 NOTE — H&P (Signed)
Rachel Valentine Appointment: 01/22/2021 10:20 AM Location: [facility] Patient #: [id number] DOB: 09/01/1941 Widowed / Language: English / Race: White Female  History of Present Illness ([name] A. [name] MD; 01/22/2021 10:57 AM) Patient words: Patient returns for follow-up after treatment for breast cancer chemotherapy. She is done with report and desires removal. She has no other complaints.  The patient is a 80 year old female.   Allergies (Ania Jones, CMA; 01/22/2021 10:04 AM) Clindamycin HCl *CHEMICALS* Rash. Fosamax *ENDOCRINE AND METABOLIC AGENTS - MISC.* Nausea. PACLitaxel *ANTINEOPLASTICS AND ADJUNCTIVE THERAPIES* Rash. Penicillins predniSONE *CORTICOSTEROIDS* Allergies Reconciled  Medication History (Ania Jones, CMA; 01/22/2021 10:05 AM) Ondansetron HCl (8MG Tablet, Oral) Active. Atorvastatin Calcium (80MG Tablet, Oral) Active. Lidocaine-Prilocaine (2.5-2.5% Cream, External) Active. LORazepam (0.5MG Tablet, Oral) Active. amLODIPine Besylate (2.5MG Tablet, Oral) Active. Aspirin Low Dose (81MG Tablet DR, Oral) Active. Rosuvastatin Calcium (40MG Tablet, Oral) Active. Metoprolol Tartrate (25MG Tablet, Oral) Active. Pantoprazole Sodium (40MG Tablet DR, Oral) Active. Nitroglycerin (0.4MG Tab Sublingual, Sublingual) Active. Citracal +D3 (Oral) Specific strength unknown - Active. Biotin (10000MCG Tablet, Oral) Active. Benadryl Allergy (25MG Tablet, Oral) Active. Medications Reconciled    Vitals (Ania Jones CMA; 01/22/2021 10:05 AM) 01/22/2021 10:05 AM Weight: 105.38 lb Height: 62in Body Surface Area: 1.46 m Body Mass Index: 19.27 kg/m  Temp.: 97.6F  Pulse: 85 (Regular)  P.OX: 96% (Room air) BP: 122/58(Sitting, Left Arm, Standard)        Physical Exam (Ladd Cen A. Deseri Loss MD; 01/22/2021 10:59 AM)  General Mental Status-Alert. General Appearance-Consistent with stated age. Hydration-Well  hydrated. Voice-Normal.  Chest and Lung Exam Note: port on right side upper chest noted  Breast Note: left breast post surgical changes noted  Musculoskeletal Normal Exam - Left-Upper Extremity Strength Normal and Lower Extremity Strength Normal. Normal Exam - Right-Upper Extremity Strength Normal and Lower Extremity Strength Normal.    Assessment & Plan (Matteus Mcnelly A. Rease Wence MD; 01/22/2021 11:01 AM)  BREAST CANCER, LEFT (C50.912) Impression: finished chemotherapy port removal  Current Plans I recommended surgery to remove the catheter. I explained the technique of removal with use of local anesthesia & possible need for more aggressive sedation/anesthesia for patient comfort.  Risks such as bleeding, infection, and other risks were discussed. Post-operative dressing/incision care was discussed. I noted a good likelihood this will help address the problem. We will work to minimize complications. Questions were answered. The patient expresses understanding & wishes to proceed with surgery.  Pt Education - CCS Free Text Education/Instructions: discussed with patient and provided information.  PORT-A-CATH IN PLACE (Z95.828) Impression: port removal  Current Plans Pt Education - CCS Free Text Education/Instructions: discussed with patient and provided information. 

## 2021-01-25 ENCOUNTER — Telehealth: Payer: Self-pay | Admitting: *Deleted

## 2021-01-25 ENCOUNTER — Other Ambulatory Visit: Payer: Self-pay

## 2021-01-25 ENCOUNTER — Encounter (HOSPITAL_BASED_OUTPATIENT_CLINIC_OR_DEPARTMENT_OTHER): Payer: Self-pay | Admitting: Surgery

## 2021-01-25 NOTE — Progress Notes (Signed)
Chart reviewed with Dr. Ambrose Pancoast. Ok to proceed as scheduled.

## 2021-01-25 NOTE — Telephone Encounter (Signed)
[id number]: Patient is due for her 52 weeks visit by 03/15/21. She is scheduled for a Survivorship visit with Wilber Bihari on 03/19/21. Spoke with patient and she agreed to have this visit moved up to 7/14 or sooner so she can complete the study visit same day as her clinic visit.  Thanked patient for her flexibility and informed her that we will notify her of new date/time of visit after I speak with Mendel Ryder to see if it's okay to move her appointment.  Patient verbalized understanding. Foye Spurling, BSN, RN Clinical Research Nurse 01/25/2021 12:22 PM

## 2021-01-26 ENCOUNTER — Telehealth: Payer: Self-pay | Admitting: *Deleted

## 2021-01-26 DIAGNOSIS — Z006 Encounter for examination for normal comparison and control in clinical research program: Secondary | ICD-10-CM

## 2021-01-26 NOTE — Telephone Encounter (Signed)
[id number] Study: Informed patient of new d/t of appt with Wilber Bihari on 03/12/21 at 9:30 am.  Research visit will be added after patient sees Blum. Patient verbalized understanding. Thanked patient for her time and flexibility. Foye Spurling, BSN, RN Clinical Research Nurse 01/26/2021 11:59 AM

## 2021-01-30 ENCOUNTER — Encounter (HOSPITAL_BASED_OUTPATIENT_CLINIC_OR_DEPARTMENT_OTHER)
Admission: RE | Admit: 2021-01-30 | Discharge: 2021-01-30 | Disposition: A | Discharge status: Home / Self Care | Payer: Medicare HMO | Source: Ambulatory Visit | Attending: Surgery | Admitting: Surgery

## 2021-01-30 DIAGNOSIS — Z881 Allergy status to other antibiotic agents status: Secondary | ICD-10-CM | POA: Diagnosis not present

## 2021-01-30 DIAGNOSIS — Z87891 Personal history of nicotine dependence: Secondary | ICD-10-CM | POA: Diagnosis not present

## 2021-01-30 DIAGNOSIS — Z880 Allergy status to penicillin: Secondary | ICD-10-CM | POA: Diagnosis not present

## 2021-01-30 DIAGNOSIS — Z955 Presence of coronary angioplasty implant and graft: Secondary | ICD-10-CM | POA: Diagnosis not present

## 2021-01-30 DIAGNOSIS — H2513 Age-related nuclear cataract, bilateral: Secondary | ICD-10-CM | POA: Diagnosis not present

## 2021-01-30 DIAGNOSIS — Z853 Personal history of malignant neoplasm of breast: Secondary | ICD-10-CM | POA: Diagnosis not present

## 2021-01-30 DIAGNOSIS — Z7982 Long term (current) use of aspirin: Secondary | ICD-10-CM | POA: Diagnosis not present

## 2021-01-30 DIAGNOSIS — H5203 Hypermetropia, bilateral: Secondary | ICD-10-CM | POA: Diagnosis not present

## 2021-01-30 DIAGNOSIS — Z79899 Other long term (current) drug therapy: Secondary | ICD-10-CM | POA: Diagnosis not present

## 2021-01-30 DIAGNOSIS — Z9221 Personal history of antineoplastic chemotherapy: Secondary | ICD-10-CM | POA: Diagnosis not present

## 2021-01-30 DIAGNOSIS — Z888 Allergy status to other drugs, medicaments and biological substances status: Secondary | ICD-10-CM | POA: Diagnosis not present

## 2021-01-30 DIAGNOSIS — Z452 Encounter for adjustment and management of vascular access device: Secondary | ICD-10-CM | POA: Diagnosis present

## 2021-01-30 LAB — COMPREHENSIVE METABOLIC PANEL
ALT: 12 U/L (ref 0–44)
AST: 25 U/L (ref 15–41)
Albumin: 4 g/dL (ref 3.5–5.0)
Alkaline Phosphatase: 68 U/L (ref 38–126)
Anion gap: 7 (ref 5–15)
BUN: 22 mg/dL (ref 8–23)
CO2: 29 mmol/L (ref 22–32)
Calcium: 9.5 mg/dL (ref 8.9–10.3)
Chloride: 101 mmol/L (ref 98–111)
Creatinine, Ser: 1.05 mg/dL — ABNORMAL HIGH (ref 0.44–1.00)
GFR, Estimated: 54 mL/min — ABNORMAL LOW (ref >60)
Glucose, Bld: 112 mg/dL — ABNORMAL HIGH (ref 70–99)
Potassium: 4.8 mmol/L (ref 3.5–5.1)
Sodium: 137 mmol/L (ref 135–145)
Total Bilirubin: 0.5 mg/dL (ref 0.3–1.2)
Total Protein: 6.7 g/dL (ref 6.5–8.1)

## 2021-01-30 LAB — CBC WITH DIFFERENTIAL/PLATELET
Abs Immature Granulocytes: 0.01 10*3/uL (ref 0.00–0.07)
Basophils Absolute: 0.1 10*3/uL (ref 0.0–0.1)
Basophils Relative: 1 %
Eosinophils Absolute: 0.9 10*3/uL — ABNORMAL HIGH (ref 0.0–0.5)
Eosinophils Relative: 13 %
HCT: 37.9 % (ref 36.0–46.0)
Hemoglobin: 12 g/dL (ref 12.0–15.0)
Immature Granulocytes: 0 %
Lymphocytes Relative: 12 %
Lymphs Abs: 0.9 10*3/uL (ref 0.7–4.0)
MCH: 29.3 pg (ref 26.0–34.0)
MCHC: 31.7 g/dL (ref 30.0–36.0)
MCV: 92.4 fL (ref 80.0–100.0)
Monocytes Absolute: 0.5 10*3/uL (ref 0.1–1.0)
Monocytes Relative: 7 %
Neutro Abs: 4.9 10*3/uL (ref 1.7–7.7)
Neutrophils Relative %: 67 %
Platelets: 275 10*3/uL (ref 150–400)
RBC: 4.1 MIL/uL (ref 3.87–5.11)
RDW: 14.4 % (ref 11.5–15.5)
WBC: 7.3 10*3/uL (ref 4.0–10.5)
nRBC: 0 % (ref 0.0–0.2)

## 2021-01-30 NOTE — Progress Notes (Signed)

## 2021-02-02 ENCOUNTER — Ambulatory Visit (HOSPITAL_BASED_OUTPATIENT_CLINIC_OR_DEPARTMENT_OTHER): Payer: Medicare HMO | Admitting: Certified Registered"

## 2021-02-02 ENCOUNTER — Encounter (HOSPITAL_BASED_OUTPATIENT_CLINIC_OR_DEPARTMENT_OTHER)
Admission: RE | Disposition: A | Discharge status: Home / Self Care | Payer: Self-pay | Source: Home / Self Care | Attending: Surgery

## 2021-02-02 ENCOUNTER — Ambulatory Visit (HOSPITAL_BASED_OUTPATIENT_CLINIC_OR_DEPARTMENT_OTHER)
Admission: RE | Admit: 2021-02-02 | Discharge: 2021-02-02 | Disposition: A | Discharge status: Home / Self Care | Payer: Medicare HMO | Attending: Surgery | Admitting: Surgery

## 2021-02-02 ENCOUNTER — Encounter (HOSPITAL_BASED_OUTPATIENT_CLINIC_OR_DEPARTMENT_OTHER): Payer: Self-pay | Admitting: Surgery

## 2021-02-02 ENCOUNTER — Other Ambulatory Visit: Payer: Self-pay

## 2021-02-02 DIAGNOSIS — E785 Hyperlipidemia, unspecified: Secondary | ICD-10-CM | POA: Diagnosis not present

## 2021-02-02 DIAGNOSIS — Z955 Presence of coronary angioplasty implant and graft: Secondary | ICD-10-CM | POA: Insufficient documentation

## 2021-02-02 DIAGNOSIS — Z87891 Personal history of nicotine dependence: Secondary | ICD-10-CM | POA: Diagnosis not present

## 2021-02-02 DIAGNOSIS — Z452 Encounter for adjustment and management of vascular access device: Secondary | ICD-10-CM | POA: Insufficient documentation

## 2021-02-02 DIAGNOSIS — Z888 Allergy status to other drugs, medicaments and biological substances status: Secondary | ICD-10-CM | POA: Diagnosis not present

## 2021-02-02 DIAGNOSIS — Z7982 Long term (current) use of aspirin: Secondary | ICD-10-CM | POA: Insufficient documentation

## 2021-02-02 DIAGNOSIS — Z9221 Personal history of antineoplastic chemotherapy: Secondary | ICD-10-CM | POA: Insufficient documentation

## 2021-02-02 DIAGNOSIS — Z79899 Other long term (current) drug therapy: Secondary | ICD-10-CM | POA: Insufficient documentation

## 2021-02-02 DIAGNOSIS — Z880 Allergy status to penicillin: Secondary | ICD-10-CM | POA: Diagnosis not present

## 2021-02-02 DIAGNOSIS — Z881 Allergy status to other antibiotic agents status: Secondary | ICD-10-CM | POA: Diagnosis not present

## 2021-02-02 DIAGNOSIS — C50512 Malignant neoplasm of lower-outer quadrant of left female breast: Secondary | ICD-10-CM | POA: Diagnosis not present

## 2021-02-02 DIAGNOSIS — Z853 Personal history of malignant neoplasm of breast: Secondary | ICD-10-CM | POA: Diagnosis not present

## 2021-02-02 DIAGNOSIS — Z170 Estrogen receptor positive status [ER+]: Secondary | ICD-10-CM | POA: Diagnosis not present

## 2021-02-02 HISTORY — PX: PORT-A-CATH REMOVAL: SHX5289

## 2021-02-02 SURGERY — REMOVAL PORT-A-CATH
Anesthesia: Monitor Anesthesia Care | Site: Chest | Laterality: Right

## 2021-02-02 MED ORDER — LIDOCAINE HCL (PF) 2 % IJ SOLN
INTRAMUSCULAR | Status: AC
  Filled 2021-02-02: qty 5

## 2021-02-02 MED ORDER — ACETAMINOPHEN 500 MG PO TABS
ORAL_TABLET | ORAL | Status: AC
  Filled 2021-02-02: qty 2

## 2021-02-02 MED ORDER — FENTANYL CITRATE (PF) 100 MCG/2ML IJ SOLN
INTRAMUSCULAR | Status: AC
  Filled 2021-02-02: qty 2

## 2021-02-02 MED ORDER — IBUPROFEN 800 MG PO TABS: 800.0000 mg | ORAL_TABLET | Freq: Three times a day (TID) | ORAL | 0 refills | Status: DC | PRN

## 2021-02-02 MED ORDER — OXYCODONE HCL 5 MG PO TABS: 5.0000 mg | ORAL_TABLET | Freq: Once | ORAL | Status: DC | PRN

## 2021-02-02 MED ORDER — BUPIVACAINE-EPINEPHRINE 0.25% -1:200000 IJ SOLN
INTRAMUSCULAR | Status: DC | PRN
  Administered 2021-02-02: 10 mL

## 2021-02-02 MED ORDER — FENTANYL CITRATE (PF) 100 MCG/2ML IJ SOLN
INTRAMUSCULAR | Status: DC | PRN
  Administered 2021-02-02 (×2): 25 ug via INTRAVENOUS

## 2021-02-02 MED ORDER — FENTANYL CITRATE (PF) 100 MCG/2ML IJ SOLN: 25.0000 ug | INTRAMUSCULAR | Status: DC | PRN

## 2021-02-02 MED ORDER — ACETAMINOPHEN 10 MG/ML IV SOLN: 1000.0000 mg | Freq: Once | INTRAVENOUS | Status: DC | PRN

## 2021-02-02 MED ORDER — CIPROFLOXACIN IN D5W 400 MG/200ML IV SOLN
INTRAVENOUS | Status: AC
  Filled 2021-02-02: qty 200

## 2021-02-02 MED ORDER — CHLORHEXIDINE GLUCONATE CLOTH 2 % EX PADS: 6.0000 | MEDICATED_PAD | Freq: Once | CUTANEOUS | Status: DC

## 2021-02-02 MED ORDER — CIPROFLOXACIN IN D5W 400 MG/200ML IV SOLN
400.0000 mg | INTRAVENOUS | Status: AC
  Administered 2021-02-02: 400 mg via INTRAVENOUS

## 2021-02-02 MED ORDER — DIPHENHYDRAMINE HCL 50 MG/ML IJ SOLN
INTRAMUSCULAR | Status: AC
  Filled 2021-02-02: qty 1

## 2021-02-02 MED ORDER — OXYCODONE HCL 5 MG/5ML PO SOLN: 5.0000 mg | Freq: Once | ORAL | Status: DC | PRN

## 2021-02-02 MED ORDER — LACTATED RINGERS IV SOLN: INTRAVENOUS | Status: DC

## 2021-02-02 MED ORDER — PROMETHAZINE HCL 25 MG/ML IJ SOLN: 6.2500 mg | INTRAMUSCULAR | Status: DC | PRN

## 2021-02-02 MED ORDER — PROPOFOL 500 MG/50ML IV EMUL
INTRAVENOUS | Status: DC | PRN
  Administered 2021-02-02: 75 ug/kg/min via INTRAVENOUS

## 2021-02-02 MED ORDER — LIDOCAINE HCL (CARDIAC) PF 100 MG/5ML IV SOSY
PREFILLED_SYRINGE | INTRAVENOUS | Status: DC | PRN
  Administered 2021-02-02: 40 mg via INTRAVENOUS

## 2021-02-02 MED ORDER — PROPOFOL 10 MG/ML IV BOLUS
INTRAVENOUS | Status: DC | PRN
  Administered 2021-02-02: 20 mg via INTRAVENOUS

## 2021-02-02 MED ORDER — DIPHENHYDRAMINE HCL 50 MG/ML IJ SOLN
6.2500 mg | Freq: Once | INTRAMUSCULAR | Status: AC
  Administered 2021-02-02: 6.5 mg via INTRAVENOUS

## 2021-02-02 MED ORDER — EPHEDRINE 5 MG/ML INJ
INTRAVENOUS | Status: AC
  Filled 2021-02-02: qty 10

## 2021-02-02 MED ORDER — ONDANSETRON HCL 4 MG/2ML IJ SOLN
INTRAMUSCULAR | Status: AC
  Filled 2021-02-02: qty 2

## 2021-02-02 MED ORDER — ACETAMINOPHEN 500 MG PO TABS
1000.0000 mg | ORAL_TABLET | ORAL | Status: AC
  Administered 2021-02-02: 1000 mg via ORAL

## 2021-02-02 MED ORDER — ACETAMINOPHEN 325 MG PO TABS: 325.0000 mg | ORAL_TABLET | ORAL | Status: DC | PRN

## 2021-02-02 MED ORDER — PROPOFOL 10 MG/ML IV BOLUS
INTRAVENOUS | Status: AC
  Filled 2021-02-02: qty 40

## 2021-02-02 MED ORDER — ACETAMINOPHEN 160 MG/5ML PO SOLN: 325.0000 mg | ORAL | Status: DC | PRN

## 2021-02-02 MED ORDER — EPHEDRINE SULFATE 50 MG/ML IJ SOLN
INTRAMUSCULAR | Status: DC | PRN
  Administered 2021-02-02: 10 mg via INTRAVENOUS

## 2021-02-02 MED ORDER — ONDANSETRON HCL 4 MG/2ML IJ SOLN
INTRAMUSCULAR | Status: DC | PRN
  Administered 2021-02-02: 4 mg via INTRAVENOUS

## 2021-02-02 MED ORDER — AMISULPRIDE (ANTIEMETIC) 5 MG/2ML IV SOLN: 10.0000 mg | Freq: Once | INTRAVENOUS | Status: DC | PRN

## 2021-02-02 SURGICAL SUPPLY — 36 items
ADH SKN CLS APL DERMABOND .7 (GAUZE/BANDAGES/DRESSINGS) ×1
APL PRP STRL LF DISP 70% ISPRP (MISCELLANEOUS) ×1
APL SKNCLS STERI-STRIP NONHPOA (GAUZE/BANDAGES/DRESSINGS)
BENZOIN TINCTURE PRP APPL 2/3 (GAUZE/BANDAGES/DRESSINGS) IMPLANT
BLADE SURG 15 STRL LF DISP TIS (BLADE) ×1 IMPLANT
BLADE SURG 15 STRL SS (BLADE) ×2
CHLORAPREP W/TINT 26 (MISCELLANEOUS) ×2 IMPLANT
COVER BACK TABLE 60X90IN (DRAPES) ×2 IMPLANT
COVER MAYO STAND STRL (DRAPES) ×2 IMPLANT
COVER WAND RF STERILE (DRAPES) IMPLANT
DECANTER SPIKE VIAL GLASS SM (MISCELLANEOUS) ×2 IMPLANT
DERMABOND ADVANCED (GAUZE/BANDAGES/DRESSINGS) ×1
DERMABOND ADVANCED .7 DNX12 (GAUZE/BANDAGES/DRESSINGS) ×1 IMPLANT
DRAPE LAPAROTOMY 100X72 PEDS (DRAPES) ×2 IMPLANT
DRAPE UTILITY XL STRL (DRAPES) ×2 IMPLANT
ELECT REM PT RETURN 9FT ADLT (ELECTROSURGICAL) ×2
ELECTRODE REM PT RTRN 9FT ADLT (ELECTROSURGICAL) ×1 IMPLANT
GLOVE SRG 8 PF TXTR STRL LF DI (GLOVE) ×1 IMPLANT
GLOVE SURG LTX SZ8 (GLOVE) ×2 IMPLANT
GLOVE SURG UNDER POLY LF SZ8 (GLOVE) ×2
GOWN STRL REUS W/ TWL LRG LVL3 (GOWN DISPOSABLE) ×2 IMPLANT
GOWN STRL REUS W/ TWL XL LVL3 (GOWN DISPOSABLE) ×1 IMPLANT
GOWN STRL REUS W/TWL LRG LVL3 (GOWN DISPOSABLE) ×4
GOWN STRL REUS W/TWL XL LVL3 (GOWN DISPOSABLE) ×2
NDL HYPO 25X1 1.5 SAFETY (NEEDLE) ×1 IMPLANT
NEEDLE HYPO 25X1 1.5 SAFETY (NEEDLE) ×2 IMPLANT
NS IRRIG 1000ML POUR BTL (IV SOLUTION) ×1 IMPLANT
PACK BASIN DAY SURGERY FS (CUSTOM PROCEDURE TRAY) ×2 IMPLANT
PENCIL SMOKE EVACUATOR (MISCELLANEOUS) IMPLANT
SLEEVE SCD COMPRESS KNEE MED (STOCKING) IMPLANT
SPONGE LAP 4X18 RFD (DISPOSABLE) ×2 IMPLANT
STRIP CLOSURE SKIN 1/2X4 (GAUZE/BANDAGES/DRESSINGS) IMPLANT
SUT MON AB 4-0 PC3 18 (SUTURE) ×2 IMPLANT
SUT VICRYL 3-0 CR8 SH (SUTURE) ×2 IMPLANT
SYR CONTROL 10ML LL (SYRINGE) ×2 IMPLANT
TOWEL GREEN STERILE FF (TOWEL DISPOSABLE) ×2 IMPLANT

## 2021-02-02 NOTE — Op Note (Signed)
Preop diagnosis: Indwelling port a catheter for chemotherapy  Postop diagnosis: Same  Procedure: Removal of port a catheter  Surgeon: Ruffus Kamaka M.D.  Anesthesia: MAC with local  EBL: Minimal  Specimen none  Drains: None  Indications for procedure: The patient presents for removal of port a catheter after completing chemotherapy. The patient no longer requires central venous access. Risks of bleeding, infection, catheter fragmentation, embolization, arrhythmias and damage to arteries, veins and nerves and possibly other mediastinal structures discussed. The patient agrees to proceed.  Description of procedure: The patient was seen in the holding area. Questions were answered. The patient agreed to proceed. The patient was taken to the operating room. The patient was placed supine. Anesthesia was initiated. The skin on the upper chest was prepped and draped in a sterile fashion. Timeout was done. The patient received preoperative antibiotics. Incision was made through the old port site and the hub of the Port-A-Cath was seen. The sutures were cut to release the port from the chest wall. The catheter was removed in its entirety without difficulty. The tract was closed with 3-0 Vicryl. 4 Monocryl was used to close the skin. All final counts were correct. The patient was taken to recovery in satisfactory condition.  

## 2021-02-02 NOTE — Anesthesia Preprocedure Evaluation (Addendum)
Anesthesia Evaluation  Patient identified by MRN, date of birth, ID band Patient awake    Reviewed: Allergy & Precautions, NPO status , Patient's Chart, lab work & pertinent test results  History of Anesthesia Complications (+) PONV and history of anesthetic complications  Airway Mallampati: I  TM Distance: >3 FB Neck ROM: Full    Dental  (+) Teeth Intact, Dental Advisory Given   Pulmonary former smoker,    breath sounds clear to auscultation       Cardiovascular hypertension, + CAD and + Cardiac Stents   Rhythm:Regular Rate:Normal     Neuro/Psych negative neurological ROS  negative psych ROS   GI/Hepatic Neg liver ROS, GERD  ,  Endo/Other  negative endocrine ROS  Renal/GU CRFRenal disease     Musculoskeletal negative musculoskeletal ROS (+)   Abdominal Normal abdominal exam  (+)   Peds  Hematology negative hematology ROS (+)   Anesthesia Other Findings   Reproductive/Obstetrics                            Anesthesia Physical Anesthesia Plan  ASA: III  Anesthesia Plan: MAC   Post-op Pain Management:    Induction: Intravenous  PONV Risk Score and Plan: 0 and Propofol infusion  Airway Management Planned: Natural Airway and Simple Face Mask  Additional Equipment: None  Intra-op Plan:   Post-operative Plan:   Informed Consent: I have reviewed the patients History and Physical, chart, labs and discussed the procedure including the risks, benefits and alternatives for the proposed anesthesia with the patient or authorized representative who has indicated his/her understanding and acceptance.       Plan Discussed with: CRNA  Anesthesia Plan Comments: (Echo:  1. Left ventricular ejection fraction, by estimation, is 55 to 60%. The  left ventricle has normal function. The left ventricle has no regional  wall motion abnormalities. Left ventricular diastolic parameters are   consistent with Grade II diastolic  dysfunction (pseudonormalization). Elevated left atrial pressure.  2. Right ventricular systolic function is normal. The right ventricular  size is normal. There is mildly elevated pulmonary artery systolic  pressure.  3. The mitral valve is normal in structure. Moderate mitral valve  regurgitation. No evidence of mitral stenosis.  4. Tricuspid valve regurgitation is mild to moderate.  5. The aortic valve is tricuspid. There is mild calcification of the  aortic valve. There is moderate thickening of the aortic valve. Aortic  valve regurgitation is mild. Mild to moderate aortic valve  sclerosis/calcification is present, without any  evidence of aortic stenosis.  6. The inferior vena cava is normal in size with greater than 50%  respiratory variability, suggesting right atrial pressure of 3 mmHg. )       Anesthesia Quick Evaluation

## 2021-02-02 NOTE — Discharge Instructions (Signed)

## 2021-02-02 NOTE — Transfer of Care (Signed)
Immediate Anesthesia Transfer of Care Note  [name]  Procedure(s) Performed: PORT REMOVAL (Right Chest)  Patient Location: PACU  Anesthesia Type:MAC  Level of Consciousness: awake, alert  and oriented  Airway & Oxygen Therapy: Patient Spontanous Breathing and Patient connected to face mask oxygen  Post-op Assessment: Report given to RN and Post -op Vital signs reviewed and stable  Post vital signs: Reviewed and stable  Last Vitals:  Vitals Value Taken Time  BP 117/53 02/02/21 0928  Temp    Pulse    Resp 13 02/02/21 0930  SpO2    Vitals shown include unvalidated device data.  Last Pain:  Vitals:   02/02/21 0758  TempSrc: Oral  PainSc: 0-No pain         Complications: No complications documented.

## 2021-02-02 NOTE — Anesthesia Postprocedure Evaluation (Signed)
Anesthesia Post Note  [name]  Procedure(s) Performed: PORT REMOVAL (Right Chest)     Patient location during evaluation: PACU Anesthesia Type: MAC Level of consciousness: awake and alert Pain management: pain level controlled Vital Signs Assessment: post-procedure vital signs reviewed and stable Respiratory status: spontaneous breathing, nonlabored ventilation, respiratory function stable and patient connected to nasal cannula oxygen Cardiovascular status: stable and blood pressure returned to baseline Postop Assessment: no apparent nausea or vomiting Anesthetic complications: no   No complications documented.  Last Vitals:  Vitals:   02/02/21 0945 02/02/21 1033  BP: (!) 125/54 (!) 135/56  Pulse: 62 65  Resp: 17 16  Temp:  36.6 C  SpO2: 97% 98%    Last Pain:  Vitals:   02/02/21 1033  TempSrc:   PainSc: 0-No pain                 Effie Berkshire

## 2021-02-02 NOTE — Interval H&P Note (Signed)
History and Physical Interval Note:  02/02/2021 8:39 AM  Rachel Valentine  has presented today for surgery, with the diagnosis of PORT IN PLACE.  The various methods of treatment have been discussed with the patient and family. After consideration of risks, benefits and other options for treatment, the patient has consented to  Procedure(s): PORT REMOVAL (N/A) as a surgical intervention.  The patient's history has been reviewed, patient examined, no change in status, stable for surgery.  I have reviewed the patient's chart and labs.  Questions were answered to the patient's satisfaction.     [place]

## 2021-02-06 ENCOUNTER — Encounter (HOSPITAL_BASED_OUTPATIENT_CLINIC_OR_DEPARTMENT_OTHER): Payer: Self-pay | Admitting: Surgery

## 2021-03-08 ENCOUNTER — Other Ambulatory Visit: Payer: Self-pay | Admitting: *Deleted

## 2021-03-08 DIAGNOSIS — C50512 Malignant neoplasm of lower-outer quadrant of left female breast: Secondary | ICD-10-CM

## 2021-03-12 ENCOUNTER — Inpatient Hospital Stay: Payer: Medicare HMO | Admitting: Adult Health

## 2021-03-12 ENCOUNTER — Inpatient Hospital Stay: Payer: Medicare HMO | Attending: Hematology and Oncology | Admitting: *Deleted

## 2021-03-12 ENCOUNTER — Inpatient Hospital Stay: Payer: Medicare HMO

## 2021-03-12 ENCOUNTER — Other Ambulatory Visit: Payer: Self-pay

## 2021-03-12 DIAGNOSIS — C50512 Malignant neoplasm of lower-outer quadrant of left female breast: Secondary | ICD-10-CM

## 2021-03-12 DIAGNOSIS — Z9221 Personal history of antineoplastic chemotherapy: Secondary | ICD-10-CM | POA: Insufficient documentation

## 2021-03-12 DIAGNOSIS — Z79811 Long term (current) use of aromatase inhibitors: Secondary | ICD-10-CM | POA: Insufficient documentation

## 2021-03-12 DIAGNOSIS — Z170 Estrogen receptor positive status [ER+]: Secondary | ICD-10-CM

## 2021-03-12 DIAGNOSIS — Z923 Personal history of irradiation: Secondary | ICD-10-CM | POA: Diagnosis not present

## 2021-03-12 DIAGNOSIS — Z006 Encounter for examination for normal comparison and control in clinical research program: Secondary | ICD-10-CM

## 2021-03-12 LAB — CMP (CANCER CENTER ONLY)
ALT: 7 U/L (ref 0–44)
AST: 19 U/L (ref 15–41)
Albumin: 4.2 g/dL (ref 3.5–5.0)
Alkaline Phosphatase: 76 U/L (ref 38–126)
Anion gap: 8 (ref 5–15)
BUN: 20 mg/dL (ref 8–23)
CO2: 28 mmol/L (ref 22–32)
Calcium: 10.3 mg/dL (ref 8.9–10.3)
Chloride: 104 mmol/L (ref 98–111)
Creatinine: 0.8 mg/dL (ref 0.44–1.00)
GFR, Estimated: >60 mL/min (ref >60)
Glucose, Bld: 91 mg/dL (ref 70–99)
Potassium: 4.5 mmol/L (ref 3.5–5.1)
Sodium: 140 mmol/L (ref 135–145)
Total Bilirubin: 0.4 mg/dL (ref 0.3–1.2)
Total Protein: 7.6 g/dL (ref 6.5–8.1)

## 2021-03-12 LAB — CBC WITH DIFFERENTIAL (CANCER CENTER ONLY)
Abs Immature Granulocytes: 0.02 10*3/uL (ref 0.00–0.07)
Basophils Absolute: 0 10*3/uL (ref 0.0–0.1)
Basophils Relative: 1 %
Eosinophils Absolute: 0.4 10*3/uL (ref 0.0–0.5)
Eosinophils Relative: 8 %
HCT: 39.8 % (ref 36.0–46.0)
Hemoglobin: 12.8 g/dL (ref 12.0–15.0)
Immature Granulocytes: 0 %
Lymphocytes Relative: 14 %
Lymphs Abs: 0.8 10*3/uL (ref 0.7–4.0)
MCH: 29.2 pg (ref 26.0–34.0)
MCHC: 32.2 g/dL (ref 30.0–36.0)
MCV: 90.9 fL (ref 80.0–100.0)
Monocytes Absolute: 0.5 10*3/uL (ref 0.1–1.0)
Monocytes Relative: 9 %
Neutro Abs: 3.7 10*3/uL (ref 1.7–7.7)
Neutrophils Relative %: 68 %
Platelet Count: 250 10*3/uL (ref 150–400)
RBC: 4.38 MIL/uL (ref 3.87–5.11)
RDW: 14.1 % (ref 11.5–15.5)
WBC Count: 5.4 10*3/uL (ref 4.0–10.5)
nRBC: 0 % (ref 0.0–0.2)

## 2021-03-12 LAB — VITAMIN D 25 HYDROXY (VIT D DEFICIENCY, FRACTURES): Vit D, 25-Hydroxy: 38.65 ng/mL (ref 30–100)

## 2021-03-12 LAB — RESEARCH LABS

## 2021-03-12 NOTE — Research (Signed)
[id number], A PROSPECTIVE OBSERVATIONAL COHORT STUDY TO DEVELOP A PREDICTIVE MODEL OF TAXANE-INDUCED PERIPHERAL NEUROPATHY IN CANCER PATIENTS. 52 Weeks ASSESSMENTS:    PROs; Questionnaires given to patient to complete in clinic. Collected questionnaires and checked for completeness and accuracy.  Labs; Optional research labs collected per protocol along with other standard of care labs ordered by provider.   Solicited Neuropathy Events Form; Patient reports ongoing numbness and tingling sensations in her hands and her feet slightly improved since last visit. It is intermittent, not painful and does not interfere with any ADLs. Patient is not taking any medication for these symptoms, but continues to use udder cream about once a day when she remembers. Form completed and signed by Dr. Lindi Adie. Physician Toxicity Burden Form; Completed by Dr. Lindi Adie.  Timed Get Up and Go Test; Completed per protocol.  History of Falls; Patient denies any falls in the past 6 months.  Concomitant Medications;  Reviewed medications with patient. She continues to take Tylenol PRN for pain (not related to neuropathy) once a day. She is not taking any anti-depressants, gabapentin, narcotics, tramadol, duloxetine or ibuprofen. She is also not taking any Vitamin E, Glutamine, Vitamin B6, Fish Oil or Vitamin B12. Topical Agents and other Treatments Patient continues to use UdderCream with Urea 40% to her hands daily for neuropathy symptoms.  Patient has not used any complementary or alternative medicines since last study visit.  Neuropen Assessment; Completed per protocol by this certified research RN with assistance recording by research coordinator Clabe Seal.   Tuning Fork Assessment; Completed per protocol by this certified research RN with assistance timing and recording by research coordinator Clabe Seal.  Treatment:  Patient completed all treatment since last study visit.  Plan: Thanked patient for her participation in this study  today. Next study visit due in one year and can be completed by phone or clinic. Research staff will be in contact for that visit.  Encouraged patient to call clinic if any questions or concerns prior to next contact. She verbalized understanding.   Foye Spurling, BSN, [employer]in crew

## 2021-03-14 ENCOUNTER — Telehealth: Payer: Self-pay | Admitting: Adult Health

## 2021-03-14 NOTE — Telephone Encounter (Signed)
Rescheduled per provider called and spoke with pt confirmed 7/18 appt

## 2021-03-16 ENCOUNTER — Encounter: Payer: Medicare HMO | Admitting: Adult Health

## 2021-03-19 ENCOUNTER — Encounter: Payer: Medicare HMO | Admitting: Adult Health

## 2021-03-19 ENCOUNTER — Inpatient Hospital Stay: Payer: Medicare HMO | Admitting: Adult Health

## 2021-03-26 ENCOUNTER — Other Ambulatory Visit: Payer: Self-pay

## 2021-03-26 ENCOUNTER — Encounter: Payer: Self-pay | Admitting: Adult Health

## 2021-03-26 ENCOUNTER — Inpatient Hospital Stay (HOSPITAL_BASED_OUTPATIENT_CLINIC_OR_DEPARTMENT_OTHER): Payer: Medicare HMO | Admitting: Adult Health

## 2021-03-26 VITALS — BP 120/51 | HR 79 | Temp 97.9°F | Resp 18 | Ht 62.0 in | Wt 105.0 lb

## 2021-03-26 DIAGNOSIS — Z170 Estrogen receptor positive status [ER+]: Secondary | ICD-10-CM

## 2021-03-26 DIAGNOSIS — Z9221 Personal history of antineoplastic chemotherapy: Secondary | ICD-10-CM | POA: Diagnosis not present

## 2021-03-26 DIAGNOSIS — C50512 Malignant neoplasm of lower-outer quadrant of left female breast: Secondary | ICD-10-CM

## 2021-03-26 DIAGNOSIS — Z79811 Long term (current) use of aromatase inhibitors: Secondary | ICD-10-CM | POA: Diagnosis not present

## 2021-03-26 DIAGNOSIS — Z923 Personal history of irradiation: Secondary | ICD-10-CM | POA: Diagnosis not present

## 2021-03-26 NOTE — Progress Notes (Signed)
SURVIVORSHIP VISIT:   BRIEF ONCOLOGIC HISTORY:  Oncology History  Malignant neoplasm of lower-outer quadrant of left breast of female, estrogen receptor positive ([place])  01/18/2020 Initial Diagnosis   Palpable left breast lump with calcifications at 5 o'clock position measuring 3.3 cm.  Biopsy revealed grade 3 IDC ER 95%, PR 0%, HER-2 3+ positive, Ki-67 50%, axilla negative, T2N0   01/26/2020 Cancer Staging   Staging form: Breast, AJCC 8th Edition - Clinical stage from 01/26/2020: Stage IIA (cT2, cN0, cM0, G3, ER+, PR-, HER2+) - Signed by Nicholas Lose, MD on 01/26/2020    02/16/2020 - 11/16/2020 Neo-Adjuvant Chemotherapy   Neoadjuvant weekly Taxol/Herceptin x 12 from 02/16/2020-05/13/2020, followed by Herceptin maintenance every 3 weeks, last given on 11/16/2020 stopped early due to increased fatigue.     06/15/2020 Surgery   Left lumpectomy ([place]): no residual carcinoma, 4 left axillary lymph nodes negative for carcinoma   06/15/2020 Cancer Staging   Staging form: Breast, AJCC 8th Edition - Pathologic stage from 06/15/2020: No Stage Recommended (ypT0, pN0, cM0) - Signed by Gardenia Phlegm, NP on 03/07/2021  Stage prefix: Post-therapy  Response to neoadjuvant therapy: Complete response    07/23/2020 - 08/22/2020 Radiation Therapy   Adjuvant radiation [facility]): The patient initially received a dose of 42.56 Gy in 16 fractions to the breast using whole-breast tangent fields. This was delivered using a 3-D conformal technique. The patient then received a boost to the seroma. This delivered an additional 8 Gy in 4 fractions using a 3 field photon technique due to the depth of the seroma. The total dose was 50.56 Gy.   09/2020 -  Anti-estrogen oral therapy   Anastrozole daily     INTERVAL HISTORY:  Rachel Valentine to review her survivorship care plan detailing her treatment course for breast cancer, as well as monitoring long-term side effects of that treatment, education regarding  health maintenance, screening, and overall wellness and health promotion.     Overall, Rachel Valentine reports feeling quite well.  She is taking Anastrozole daily.  She is tolerating it well.  She has some increased generalized weakness.   She denies hot flashes, vaginal dryness.  She exercises by walking every day.   She notes some occasional tremors and sees her GP on Thursday and plans on mentioning it to her.  She also loses her eyelashes intermittently.  Otherwise, she is doing well and a detailed ROS wa  REVIEW OF SYSTEMS:  Review of Systems  Constitutional:  Positive for fatigue. Negative for appetite change, chills, fever and unexpected weight change.  HENT:   Negative for hearing loss, lump/mass and trouble swallowing.   Eyes:  Negative for eye problems and icterus.  Respiratory:  Negative for chest tightness, cough and shortness of breath.   Cardiovascular:  Negative for chest pain, leg swelling and palpitations.  Gastrointestinal:  Negative for abdominal distention, abdominal pain, constipation, diarrhea, nausea and vomiting.  Endocrine: Negative for hot flashes.  Genitourinary:  Negative for difficulty urinating.   Musculoskeletal:  Negative for arthralgias.  Skin:  Negative for itching and rash.  Neurological:  Negative for dizziness, extremity weakness, headaches and numbness.  Hematological:  Negative for adenopathy. Does not bruise/bleed easily.  Psychiatric/Behavioral:  Negative for depression. The patient is not nervous/anxious.   Breast: Denies any new nodularity, masses, tenderness, nipple changes, or nipple discharge.      ONCOLOGY TREATMENT TEAM:  1. Surgeon:  Dr. Brantley Stage at [facility] 2. Medical Oncologist: Dr. Lindi Adie  3. Radiation Oncologist: Dr. Lisbeth Renshaw  PAST MEDICAL/SURGICAL HISTORY:  Past Medical History:  Diagnosis Date   Anemia    during chemo   Bowel obstruction ([place])    Cancer ([place])    dx of breast cancer 2021 - per pt in PAT 02/09/20    Chronic kidney disease    blood clot in right kidney after surgery in June, 2021   Coronary artery disease    GERD (gastroesophageal reflux disease)    Hypertension    Pneumonia    PONV (postoperative nausea and vomiting)    Past Surgical History:  Procedure Laterality Date   ABDOMINAL HYSTERECTOMY     ABDOMINAL SURGERY     APPENDECTOMY     AUGMENTATION MAMMAPLASTY Bilateral    implants removed in 2005   BREAST EXCISIONAL BIOPSY Left    benign   BREAST LUMPECTOMY WITH RADIOACTIVE SEED AND SENTINEL LYMPH NODE BIOPSY Left 06/15/2020   Procedure: LEFT BREAST LUMPECTOMYX 2 WITH RADIOACTIVE SEED AND SENTINEL LYMPH NODE BIOPSY;  Surgeon: [name], MD;  Location: [facility];  Service: General;  Laterality: Left;  PECTORAL BLOCK   CORONARY ARTERY BYPASS GRAFT N/A 06/28/2019   Procedure: CORONARY ARTERY BYPASS GRAFTING (CABG) using endoscopic harvest right saphenous vein to the posterior lateral (PLB).;  Surgeon: Gaye Pollack, MD;  Location: [facility];  Service: Open Heart Surgery;  Laterality: N/A;   CORONARY ATHERECTOMY N/A 01/05/2019   Procedure: CORONARY ATHERECTOMY - STENT;  Surgeon: Burnell Blanks, MD;  Location: [place] [facility];  Service: Cardiovascular;  Laterality: N/A;   CORONARY STENT INTERVENTION N/A 01/04/2019   Procedure: CORONARY STENT INTERVENTION;  Surgeon: Leonie Man, MD;  Location: [place] [facility];  Service: Cardiovascular;  Laterality: N/A;   LEFT HEART CATH AND CORONARY ANGIOGRAPHY N/A 01/04/2019   Procedure: LEFT HEART CATH AND CORONARY ANGIOGRAPHY;  Surgeon: Leonie Man, MD;  Location: [place] [facility];  Service: Cardiovascular;  Laterality: N/A;   LEFT HEART CATH AND CORONARY ANGIOGRAPHY N/A 06/24/2019   Procedure: LEFT HEART CATH AND CORONARY ANGIOGRAPHY;  Surgeon: [name], MD;  Location: [place] [facility];  Service: Cardiovascular;  Laterality: N/A;   PORT-A-CATH REMOVAL Right 02/02/2021   Procedure: PORT REMOVAL;  Surgeon: [name], MD;  Location: [place];  Service: General;  Laterality: Right;   PORTACATH PLACEMENT Right 02/15/2020   Procedure: INSERTION PORT-A-CATH WITH ULTRASOUND GUIDANCE;  Surgeon: [name], MD;  Location: [place];  Service: General;  Laterality: Right;   TEE WITHOUT CARDIOVERSION N/A 06/28/2019   Procedure: TRANSESOPHAGEAL ECHOCARDIOGRAM (TEE);  Surgeon: Gaye Pollack, MD;  Location: [place];  Service: Open Heart Surgery;  Laterality: N/A;   TEMPORARY PACEMAKER N/A 01/05/2019   Procedure: TEMPORARY PACEMAKER;  Surgeon: Burnell Blanks, MD;  Location: [place] [facility];  Service: Cardiovascular;  Laterality: N/A;   TONSILLECTOMY       ALLERGIES:  Allergies  Allergen Reactions   Paclitaxel Rash   Fosamax [Alendronate] Nausea Only    Unset stomach   Prednisone Other (See Comments)    Blood pressure high when she takes it   Clindamycin/Lincomycin Rash   Penicillins Rash    Did it involve swelling of the face/tongue/throat, SOB, or low BP? No Did it involve sudden or severe rash/hives, skin peeling, or any reaction on the inside of your mouth or nose? No Did you need to seek medical attention at a hospital or doctor's office? Yes When did it last happen?      Childhood If  all above answers are "NO", may proceed with cephalosporin use.     CURRENT MEDICATIONS:  Outpatient Encounter Medications as of 03/26/2021  Medication Sig   anastrozole (ARIMIDEX) 1 MG tablet Take 1 tablet (1 mg total) by mouth daily.   aspirin EC 81 MG tablet Take 81 mg by mouth daily. Swallow whole.   Biotin 10000 MCG TABS Take 10,000 mg by mouth daily.    Calcium-Phosphorus-Vitamin D (CITRACAL +D3 PO) Take 1 tablet by mouth in the morning and at bedtime.    diphenhydrAMINE (BENADRYL) 25 mg capsule Take 25 mg by mouth at bedtime as needed for allergies.    ipratropium (ATROVENT) 0.03 % nasal spray Place 2 sprays into both nostrils daily.   metoprolol succinate (TOPROL-XL) 25 MG 24 hr  tablet Take 1 tablet (25 mg total) by mouth daily.   nitroGLYCERIN (NITROSTAT) 0.4 MG SL tablet Place 1 tablet (0.4 mg total) under the tongue every 5 (five) minutes as needed for chest pain.   pantoprazole (PROTONIX) 40 MG tablet TAKE 1 TABLET BY MOUTH EVERY DAY   Propylene Glycol (SYSTANE BALANCE) 0.6 % SOLN Place 2 drops into both eyes daily as needed (Dry eyes).    rosuvastatin (CRESTOR) 40 MG tablet Take 40 mg by mouth daily.    No facility-administered encounter medications on file as of 03/26/2021.     ONCOLOGIC FAMILY HISTORY:  Family History  Problem Relation Age of Onset   Heart failure Mother    Stroke Father    Stroke Brother      GENETIC COUNSELING/TESTING: Not at this time   SOCIAL HISTORY:  Social History   Socioeconomic History   Marital status: Widowed    Spouse name: Not on file   Number of children: Not on file   Years of education: Not on file   Highest education level: Not on file  Occupational History   Not on file  Tobacco Use   Smoking status: Former    Types: Cigarettes    Quit date: 01/06/1999    Years since quitting: 22.2   Smokeless tobacco: Never  Vaping Use   Vaping Use: Never used  Substance and Sexual Activity   Alcohol use: No   Drug use: Never   Sexual activity: Not on file  Other Topics Concern   Not on file  Social History Narrative   Not on file   Social Determinants of Health   Financial Resource Strain: Not on file  Food Insecurity: Not on file  Transportation Needs: Not on file  Physical Activity: Not on file  Stress: Not on file  Social Connections: Not on file  Intimate Partner Violence: Not on file     OBSERVATIONS/OBJECTIVE:  BP (!) 120/51 (BP Location: Right Arm, Patient Position: Sitting)   Pulse 79   Temp 97.9 F (36.6 C) (Tympanic)   Resp 18   Ht _0  (1.575 m)   Wt 105 lb (47.6 kg)   LMP  (LMP Unknown)   BMI 19.20 kg/m  GENERAL: Patient is a well appearing female in no acute distress HEENT:   Sclerae anicteric.  Oropharynx clear and moist. No ulcerations or evidence of oropharyngeal candidiasis. Neck is supple.  NODES:  No cervical, supraclavicular, or axillary lymphadenopathy palpated.  BREAST EXAM:  right breast benign, left breast s/p lumpectomies and radiation, no sign of local recurrence LUNGS:  Clear to auscultation bilaterally.  No wheezes or rhonchi. HEART:  Regular rate and rhythm. No murmur appreciated. ABDOMEN:  Soft, nontender.  Positive,  normoactive bowel sounds. No organomegaly palpated. MSK:  No focal spinal tenderness to palpation. Full range of motion bilaterally in the upper extremities. EXTREMITIES:  No peripheral edema.   SKIN:  Clear with no obvious rashes or skin changes. No nail dyscrasia. NEURO:  Nonfocal. Well oriented.  Appropriate affect.   LABORATORY DATA:  None for this visit.  DIAGNOSTIC IMAGING:  None for this visit.      ASSESSMENT AND PLAN:  Ms.. Valentine is a pleasant 80 y.o. female with Stage IIA left breast invasive ductal carcinoma, ER+/PR-/HER2+, diagnosed in 01/2020, treated with neoadjuvant chemotherapy, lumpectomy, adjuvant radiation therapy, and anti-estrogen therapy with Anastrozole beginning in 09/2020.  She presents to the Survivorship Clinic for our initial meeting and routine follow-up post-completion of treatment for breast cancer.    1. Stage IIA left breast cancer:  Rachel Valentine is continuing to recover from definitive treatment for breast cancer. She will follow-up with her medical oncologist, Dr. Lindi Adie in 6 months with history and physical exam per surveillance protocol.  She will continue her anti-estrogen therapy with Anastrozole. Thus far, she is tolerating the Anastrozole well, with minimal side effects. She was instructed to make Dr. Lindi Adie or myself aware if she begins to experience any worsening side effects of the medication and I could see her back in clinic to help manage those side effects, as needed. Her mammogram  is due; orders placed today. Today, a comprehensive survivorship care plan and treatment summary was reviewed with the patient today detailing her breast cancer diagnosis, treatment course, potential late/long-term effects of treatment, appropriate follow-up care with recommendations for the future, and patient education resources.  A copy of this summary, along with a letter will be sent to the patient's primary care provider via mail/fax/In Basket message after today's visit.    2. Fatigue: I reviewed with Rachel Valentine that this does take some time to improve, and we also discussed the use of ginseng BID which can help.  She is also taking collagen.    3. Bone health:  Given Rachel Valentine's age/history of breast cancer and her current treatment regimen including anti-estrogen therapy with Anastrozole, she is at risk for bone demineralization.  She is due for a bone density exam and I placed orders for this today. She was given education on specific activities to promote bone health.  4. Cancer screening:  Due to Rachel Valentine's history and her age, she should receive screening for skin cancers.  The information and recommendations are listed on the patient's comprehensive care plan/treatment summary and were reviewed in detail with the patient.    5. Health maintenance and wellness promotion: Rachel Valentine was encouraged to consume 5-7 servings of fruits and vegetables per day. We reviewed the "Nutrition Rainbow" handout, as well as the handout "Take Control of Your Health and Reduce Your Cancer Risk" from the [employer].  She was also encouraged to engage in moderate to vigorous exercise for 30 minutes per day most days of the week. We discussed the [employer] fitness program, which is designed for cancer survivors to help them become more physically fit after cancer treatments.  She was instructed to limit her alcohol consumption and continue to abstain from tobacco use.     6. Support  services/counseling: It is not uncommon for this period of the patient's cancer care trajectory to be one of many emotions and stressors.  We discussed how this can be increasingly difficult during the times of quarantine and social distancing due to  the COVID-19 pandemic.   She was given information regarding our available services and encouraged to contact me with any questions or for help enrolling in any of our support group/programs.    Follow up instructions:    -Return to cancer center in 6 months for f/u with Dr. Lindi Adie  -Mammogram due  -Bone density due -Follow up with surgery in one year -She is welcome to return back to the Survivorship Clinic at any time; no additional follow-up needed at this time.  -Consider referral back to survivorship as a long-term survivor for continued surveillance  The patient was provided an opportunity to ask questions and all were answered. The patient agreed with the plan and demonstrated an understanding of the instructions.   Total encounter time: 30 minutes in SCP preparation, face to face visit time, chart review, lab review, care coordination and documentation of the encounter.  Wilber Bihari, NP 03/26/21 3:32 PM Medical Oncology and Hematology [facility] [place], [place] [address] Tel. [phone]    Fax. [phone]  *Total Encounter Time as defined by the Centers for Medicare and Medicaid Services includes, in addition to the face-to-face time of a patient visit (documented in the note above) non-face-to-face time: obtaining and reviewing outside history, ordering and reviewing medications, tests or procedures, care coordination (communications with other health care professionals or caregivers) and documentation in the medical record.

## 2021-03-27 ENCOUNTER — Telehealth: Payer: Self-pay | Admitting: Adult Health

## 2021-03-27 NOTE — Telephone Encounter (Signed)
Scheduled appointment per 07/25 los. Left message.

## 2021-03-29 DIAGNOSIS — M810 Age-related osteoporosis without current pathological fracture: Secondary | ICD-10-CM | POA: Diagnosis not present

## 2021-03-29 DIAGNOSIS — J302 Other seasonal allergic rhinitis: Secondary | ICD-10-CM | POA: Diagnosis not present

## 2021-03-29 DIAGNOSIS — I2510 Atherosclerotic heart disease of native coronary artery without angina pectoris: Secondary | ICD-10-CM | POA: Diagnosis not present

## 2021-03-29 DIAGNOSIS — Z0000 Encounter for general adult medical examination without abnormal findings: Secondary | ICD-10-CM | POA: Diagnosis not present

## 2021-03-29 DIAGNOSIS — M25561 Pain in right knee: Secondary | ICD-10-CM | POA: Diagnosis not present

## 2021-03-29 DIAGNOSIS — E440 Moderate protein-calorie malnutrition: Secondary | ICD-10-CM | POA: Diagnosis not present

## 2021-03-29 DIAGNOSIS — R110 Nausea: Secondary | ICD-10-CM | POA: Diagnosis not present

## 2021-03-29 DIAGNOSIS — E7800 Pure hypercholesterolemia, unspecified: Secondary | ICD-10-CM | POA: Diagnosis not present

## 2021-03-29 DIAGNOSIS — C50412 Malignant neoplasm of upper-outer quadrant of left female breast: Secondary | ICD-10-CM | POA: Diagnosis not present

## 2021-03-29 DIAGNOSIS — I10 Essential (primary) hypertension: Secondary | ICD-10-CM | POA: Diagnosis not present

## 2021-04-03 ENCOUNTER — Other Ambulatory Visit: Payer: Self-pay

## 2021-04-03 ENCOUNTER — Other Ambulatory Visit: Payer: Self-pay | Admitting: Family Medicine

## 2021-04-03 ENCOUNTER — Ambulatory Visit
Admission: RE | Admit: 2021-04-03 | Discharge: 2021-04-03 | Disposition: A | Discharge status: Home / Self Care | Payer: Medicare HMO | Source: Ambulatory Visit | Attending: Family Medicine | Admitting: Family Medicine

## 2021-04-03 DIAGNOSIS — M25561 Pain in right knee: Secondary | ICD-10-CM

## 2021-04-03 IMAGING — DX DG KNEE COMPLETE 4+V*R*
4 series · 4 of 4 positions shown · non-contrast
Comparison: No recent prior.

CLINICAL DATA: Right knee pain.  No injury.

EXAM:
RIGHT KNEE - COMPLETE 4+ VIEW

[dg knee complete 4 views right (1 of 4)]
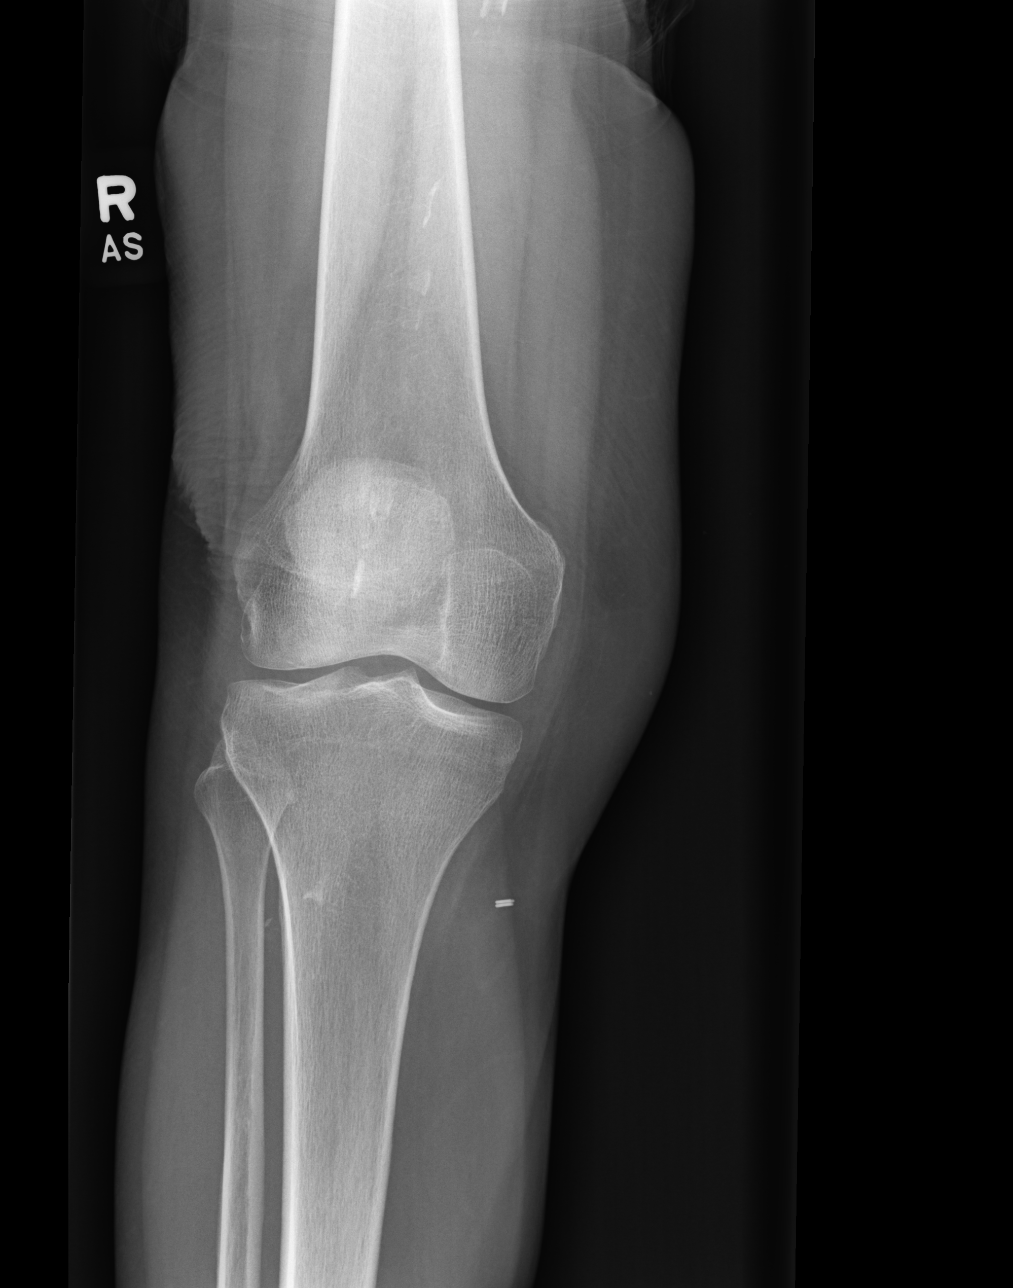

[dg knee complete 4 views right (2 of 4)]
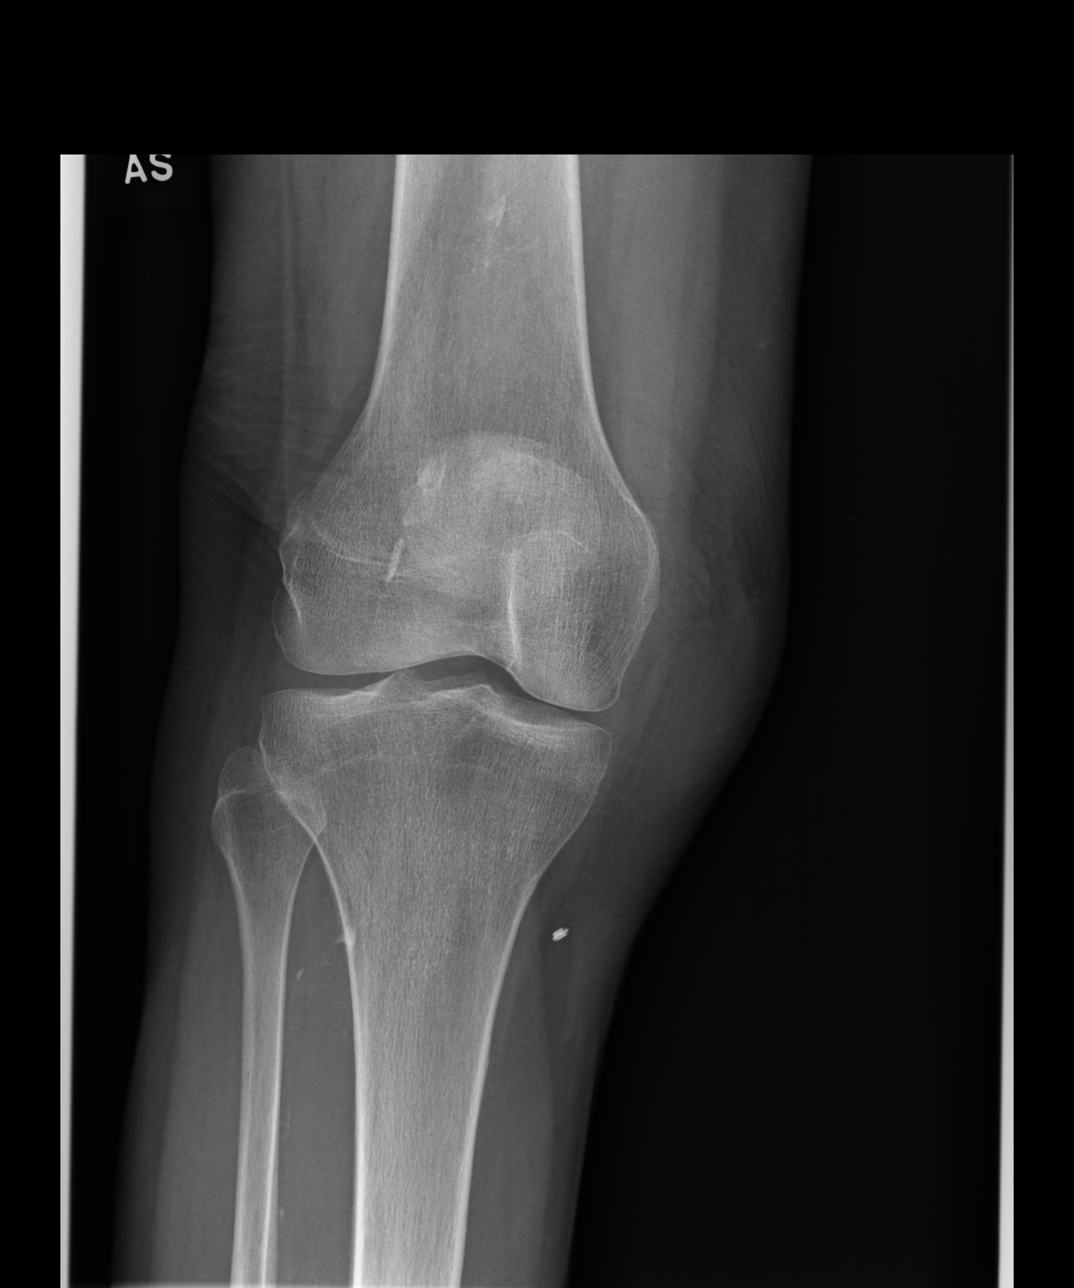

[dg knee complete 4 views right (3 of 4)]
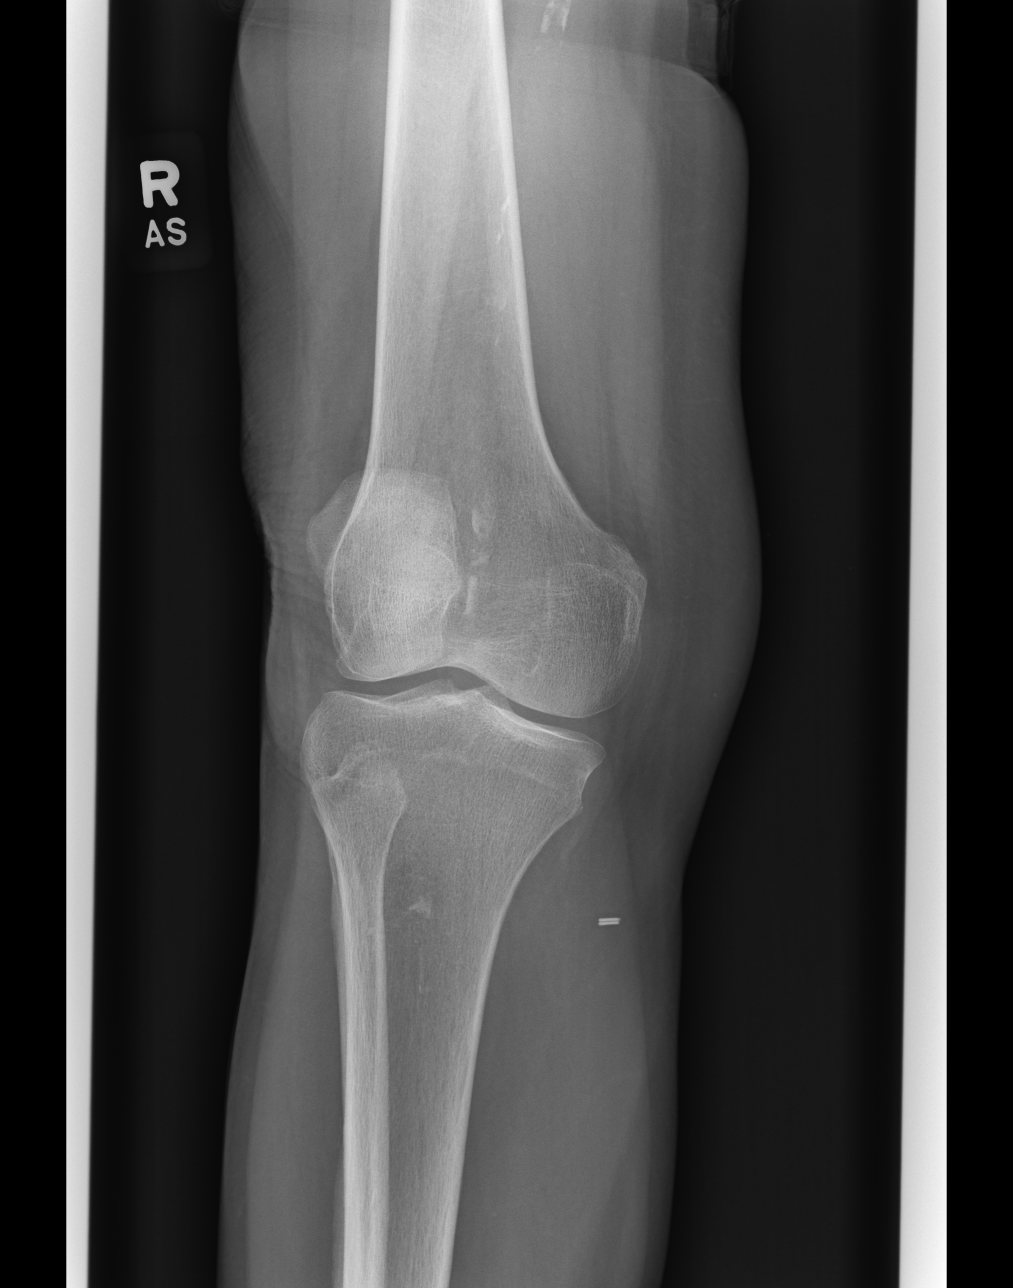

[dg knee complete 4 views right (4 of 4)]
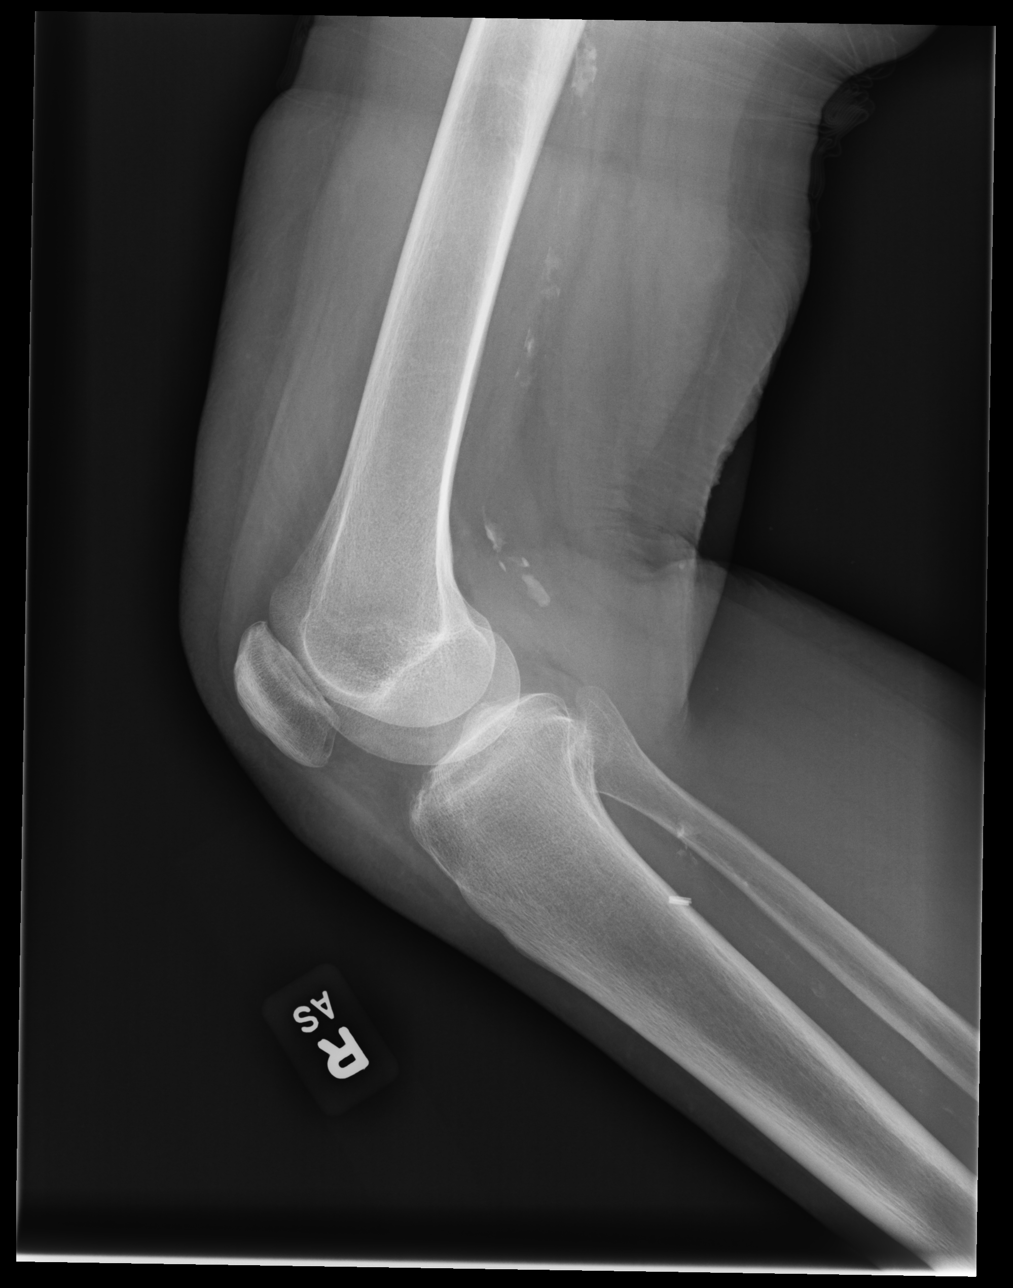

[4 of 4 positions shown; findings below may reference images not displayed]

FINDINGS: Surgical clips noted over the medial knee. No evidence of effusion.
For/calcifications. No acute bony abnormality identified. No
evidence of fracture dislocation. Mild patellar and medial
compartment degenerative change.
IMPRESSION: 1. Mild degenerative changes right knee. No acute bony abnormality.

2.  Peripheral vascular disease.

## 2021-04-09 ENCOUNTER — Encounter: Payer: Self-pay | Admitting: Hematology and Oncology

## 2021-04-10 ENCOUNTER — Telehealth: Payer: Self-pay | Admitting: Hematology and Oncology

## 2021-04-10 NOTE — Telephone Encounter (Signed)
Scheduled appts per 8/9 sch msg. Called pt, no answer. Left msg with appts dates and times.

## 2021-04-12 ENCOUNTER — Ambulatory Visit: Payer: Medicare HMO

## 2021-04-20 ENCOUNTER — Other Ambulatory Visit: Payer: Self-pay

## 2021-04-20 ENCOUNTER — Inpatient Hospital Stay: Payer: Medicare HMO

## 2021-04-20 ENCOUNTER — Inpatient Hospital Stay: Payer: Medicare HMO | Attending: Hematology and Oncology

## 2021-04-20 VITALS — BP 145/59 | HR 69 | Temp 98.1°F | Resp 16

## 2021-04-20 DIAGNOSIS — Z9221 Personal history of antineoplastic chemotherapy: Secondary | ICD-10-CM | POA: Diagnosis not present

## 2021-04-20 DIAGNOSIS — Z170 Estrogen receptor positive status [ER+]: Secondary | ICD-10-CM | POA: Insufficient documentation

## 2021-04-20 DIAGNOSIS — Z923 Personal history of irradiation: Secondary | ICD-10-CM | POA: Diagnosis not present

## 2021-04-20 DIAGNOSIS — C50512 Malignant neoplasm of lower-outer quadrant of left female breast: Secondary | ICD-10-CM

## 2021-04-20 DIAGNOSIS — Z95828 Presence of other vascular implants and grafts: Secondary | ICD-10-CM

## 2021-04-20 DIAGNOSIS — Z79811 Long term (current) use of aromatase inhibitors: Secondary | ICD-10-CM | POA: Diagnosis not present

## 2021-04-20 LAB — CBC WITH DIFFERENTIAL (CANCER CENTER ONLY)
Abs Immature Granulocytes: 0.02 10*3/uL (ref 0.00–0.07)
Basophils Absolute: 0 10*3/uL (ref 0.0–0.1)
Basophils Relative: 1 %
Eosinophils Absolute: 0.3 10*3/uL (ref 0.0–0.5)
Eosinophils Relative: 5 %
HCT: 37.9 % (ref 36.0–46.0)
Hemoglobin: 12.2 g/dL (ref 12.0–15.0)
Immature Granulocytes: 0 %
Lymphocytes Relative: 16 %
Lymphs Abs: 0.9 10*3/uL (ref 0.7–4.0)
MCH: 29.6 pg (ref 26.0–34.0)
MCHC: 32.2 g/dL (ref 30.0–36.0)
MCV: 92 fL (ref 80.0–100.0)
Monocytes Absolute: 0.5 10*3/uL (ref 0.1–1.0)
Monocytes Relative: 8 %
Neutro Abs: 3.8 10*3/uL (ref 1.7–7.7)
Neutrophils Relative %: 70 %
Platelet Count: 264 10*3/uL (ref 150–400)
RBC: 4.12 MIL/uL (ref 3.87–5.11)
RDW: 14.4 % (ref 11.5–15.5)
WBC Count: 5.5 10*3/uL (ref 4.0–10.5)
nRBC: 0 % (ref 0.0–0.2)

## 2021-04-20 LAB — CMP (CANCER CENTER ONLY)
ALT: 7 U/L (ref 0–44)
AST: 18 U/L (ref 15–41)
Albumin: 4.1 g/dL (ref 3.5–5.0)
Alkaline Phosphatase: 78 U/L (ref 38–126)
Anion gap: 9 (ref 5–15)
BUN: 19 mg/dL (ref 8–23)
CO2: 28 mmol/L (ref 22–32)
Calcium: 9.6 mg/dL (ref 8.9–10.3)
Chloride: 105 mmol/L (ref 98–111)
Creatinine: 0.8 mg/dL (ref 0.44–1.00)
GFR, Estimated: >60 mL/min (ref >60)
Glucose, Bld: 98 mg/dL (ref 70–99)
Potassium: 4.2 mmol/L (ref 3.5–5.1)
Sodium: 142 mmol/L (ref 135–145)
Total Bilirubin: 0.4 mg/dL (ref 0.3–1.2)
Total Protein: 7 g/dL (ref 6.5–8.1)

## 2021-04-20 MED ORDER — DENOSUMAB 60 MG/ML ~~LOC~~ SOSY
60.0000 mg | PREFILLED_SYRINGE | Freq: Once | SUBCUTANEOUS | Status: AC
  Administered 2021-04-20: 60 mg via SUBCUTANEOUS
  Filled 2021-04-20: qty 1

## 2021-04-20 NOTE — Patient Instructions (Signed)
Denosumab injection What is this medication? DENOSUMAB (den oh sue mab) slows bone breakdown. Prolia is used to treat osteoporosis in women after menopause and in men, and in people who are taking corticosteroids for 6 months or more. Delton See is used to treat a high calcium level due to cancer and to prevent bone fractures and other bone problems caused by multiple myeloma or cancer bone metastases. Delton See is also used totreat giant cell tumor of the bone. This medicine may be used for other purposes; ask your health care provider orpharmacist if you have questions. COMMON BRAND NAME(S): Prolia, XGEVA What should I tell my care team before I take this medication? They need to know if you have any of these conditions: dental disease having surgery or tooth extraction infection kidney disease low levels of calcium or Vitamin D in the blood malnutrition on hemodialysis skin conditions or sensitivity thyroid or parathyroid disease an unusual reaction to denosumab, other medicines, foods, dyes, or preservatives pregnant or trying to get pregnant breast-feeding How should I use this medication? This medicine is for injection under the skin. It is given by a health careprofessional in a hospital or clinic setting. A special MedGuide will be given to you before each treatment. Be sure to readthis information carefully each time. For Prolia, talk to your pediatrician regarding the use of this medicine in children. Special care may be needed. For Delton See, talk to your pediatrician regarding the use of this medicine in children. While this drug may be prescribed for children as young as 13 years for selected conditions,precautions do apply. Overdosage: If you think you have taken too much of this medicine contact apoison control center or emergency room at once. NOTE: This medicine is only for you. Do not share this medicine with others. What if I miss a dose? It is important not to miss your dose. Call  your doctor or health careprofessional if you are unable to keep an appointment. What may interact with this medication? Do not take this medicine with any of the following medications: other medicines containing denosumab This medicine may also interact with the following medications: medicines that lower your chance of fighting infection steroid medicines like prednisone or cortisone This list may not describe all possible interactions. Give your health care provider a list of all the medicines, herbs, non-prescription drugs, or dietary supplements you use. Also tell them if you smoke, drink alcohol, or use illegaldrugs. Some items may interact with your medicine. What should I watch for while using this medication? Visit your doctor or health care professional for regular checks on your progress. Your doctor or health care professional may order blood tests andother tests to see how you are doing. Call your doctor or health care professional for advice if you get a fever, chills or sore throat, or other symptoms of a cold or flu. Do not treat yourself. This drug may decrease your body's ability to fight infection. Try toavoid being around people who are sick. You should make sure you get enough calcium and vitamin D while you are taking this medicine, unless your doctor tells you not to. Discuss the foods you eatand the vitamins you take with your health care professional. See your dentist regularly. Brush and floss your teeth as directed. Before youhave any dental work done, tell your dentist you are receiving this medicine. Do not become pregnant while taking this medicine or for 5 months after stopping it. Talk with your doctor or health care professional about your  birth control options while taking this medicine. Women should inform their doctor if they wish to become pregnant or think they might be pregnant. There is a potential for serious side effects to an unborn child. Talk to your health  careprofessional or pharmacist for more information. What side effects may I notice from receiving this medication? Side effects that you should report to your doctor or health care professionalas soon as possible: allergic reactions like skin rash, itching or hives, swelling of the face, lips, or tongue bone pain breathing problems dizziness jaw pain, especially after dental work redness, blistering, peeling of the skin signs and symptoms of infection like fever or chills; cough; sore throat; pain or trouble passing urine signs of low calcium like fast heartbeat, muscle cramps or muscle pain; pain, tingling, numbness in the hands or feet; seizures unusual bleeding or bruising unusually weak or tired Side effects that usually do not require medical attention (report to yourdoctor or health care professional if they continue or are bothersome): constipation diarrhea headache joint pain loss of appetite muscle pain runny nose tiredness upset stomach This list may not describe all possible side effects. Call your doctor for medical advice about side effects. You may report side effects to FDA at1-800-FDA-1088. Where should I keep my medication? This medicine is only given in a clinic, doctor's office, or other health caresetting and will not be stored at home. NOTE: This sheet is a summary. It may not cover all possible information. If you have questions about this medicine, talk to your doctor, pharmacist, orhealth care provider.  2022 Elsevier/Gold Standard (2017-12-26 16:10:44)

## 2021-05-19 ENCOUNTER — Other Ambulatory Visit: Payer: Self-pay

## 2021-05-19 ENCOUNTER — Other Ambulatory Visit: Payer: Self-pay | Admitting: Adult Health

## 2021-05-19 ENCOUNTER — Ambulatory Visit
Admission: RE | Admit: 2021-05-19 | Discharge: 2021-05-19 | Disposition: A | Discharge status: Home / Self Care | Payer: Medicare HMO | Source: Ambulatory Visit | Attending: Adult Health | Admitting: Adult Health

## 2021-05-19 DIAGNOSIS — Z170 Estrogen receptor positive status [ER+]: Secondary | ICD-10-CM

## 2021-05-19 DIAGNOSIS — C50512 Malignant neoplasm of lower-outer quadrant of left female breast: Secondary | ICD-10-CM

## 2021-05-19 DIAGNOSIS — R921 Mammographic calcification found on diagnostic imaging of breast: Secondary | ICD-10-CM

## 2021-05-19 DIAGNOSIS — R922 Inconclusive mammogram: Secondary | ICD-10-CM | POA: Diagnosis not present

## 2021-05-19 IMAGING — MG DIGITAL DIAGNOSTIC BILAT W/ TOMO W/ CAD
8 of 11 series · 8 of 27 positions shown · non-contrast
Comparison: Previous exam(s).

CLINICAL DATA: Patient presents for bilateral diagnostic exam
history of left malignant lumpectomy [DATE].

EXAM:
DIGITAL DIAGNOSTIC BILATERAL MAMMOGRAM WITH TOMOSYNTHESIS AND CAD
TECHNIQUE: Bilateral digital diagnostic mammography and breast tomosynthesis
was performed. The images were evaluated with computer-aided
detection.

[L CC]
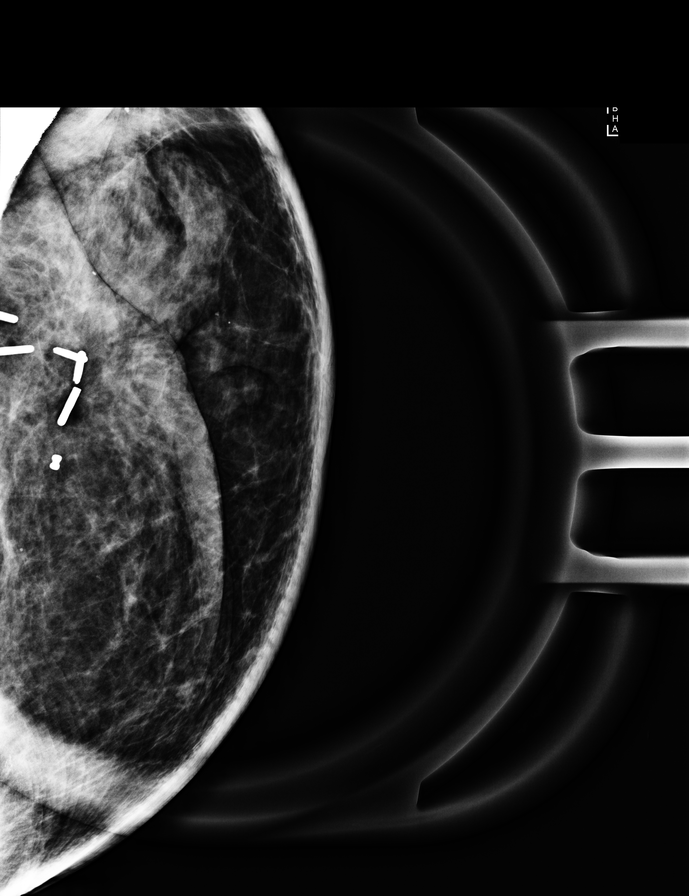

[L MLO]
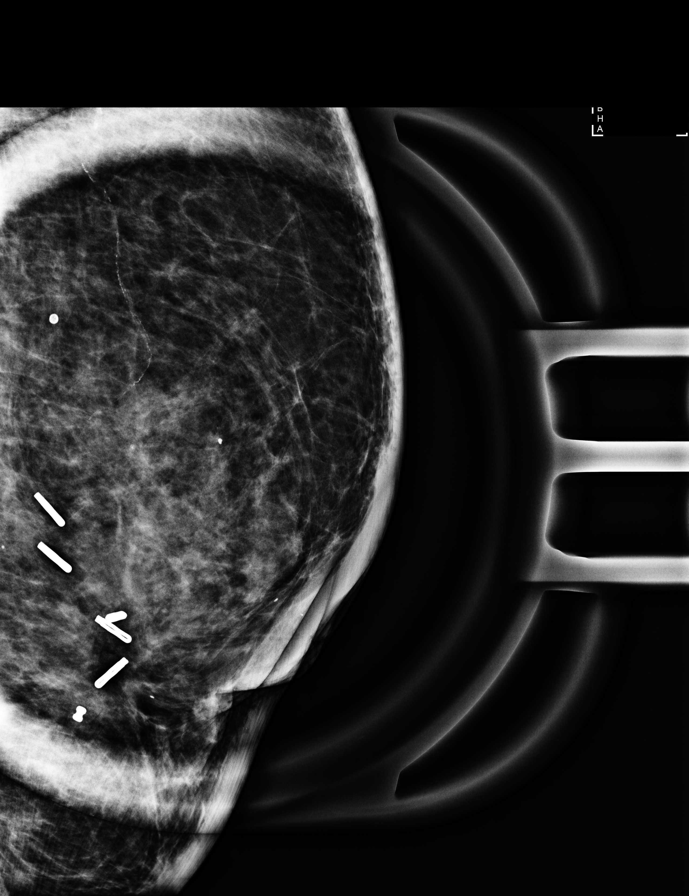

[L ML]
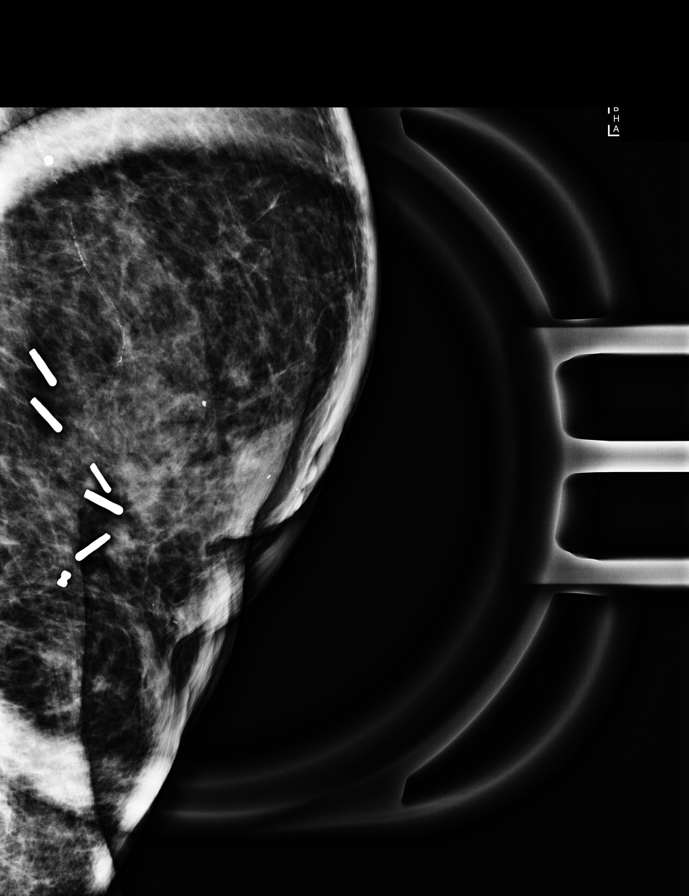

[R MLO synth-2D]
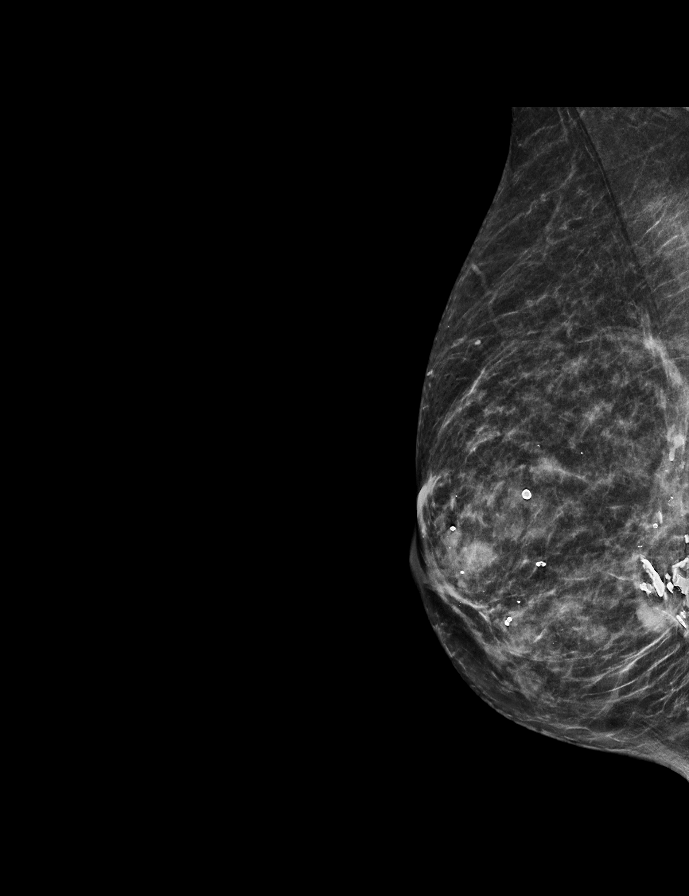

[L MLO synth-2D]
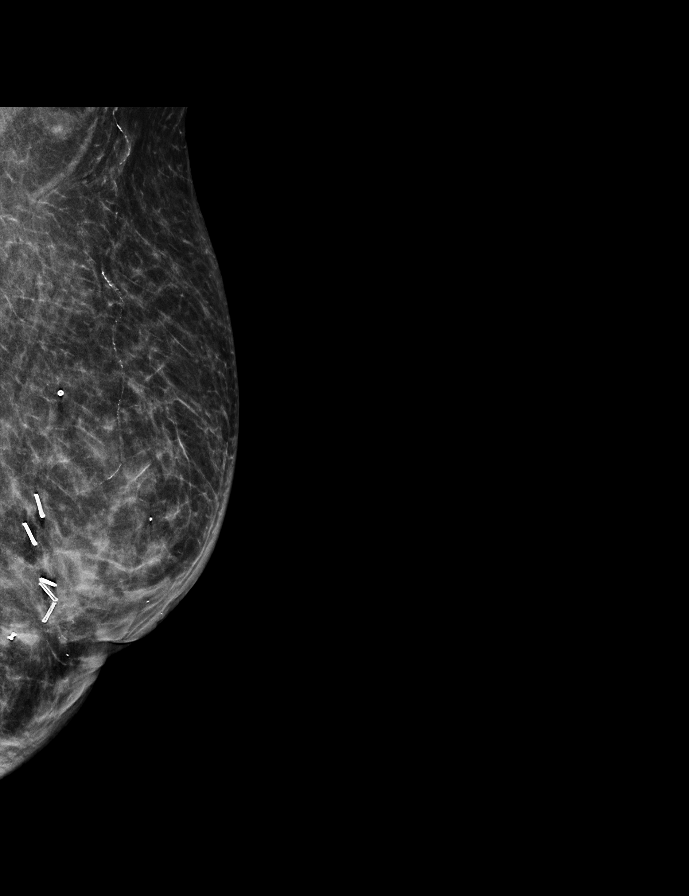

[R CC synth-2D]
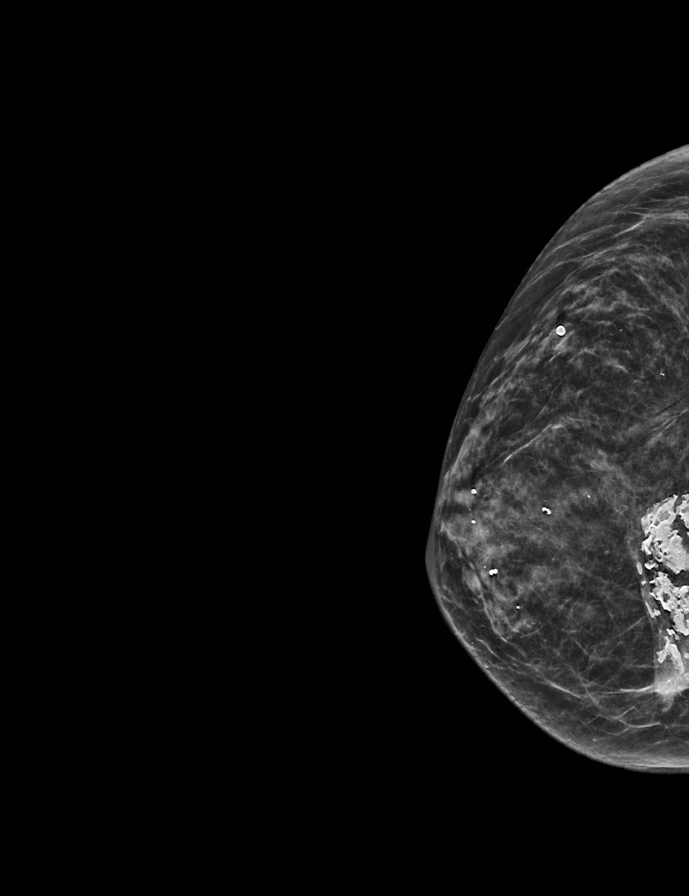

[L CC synth-2D]
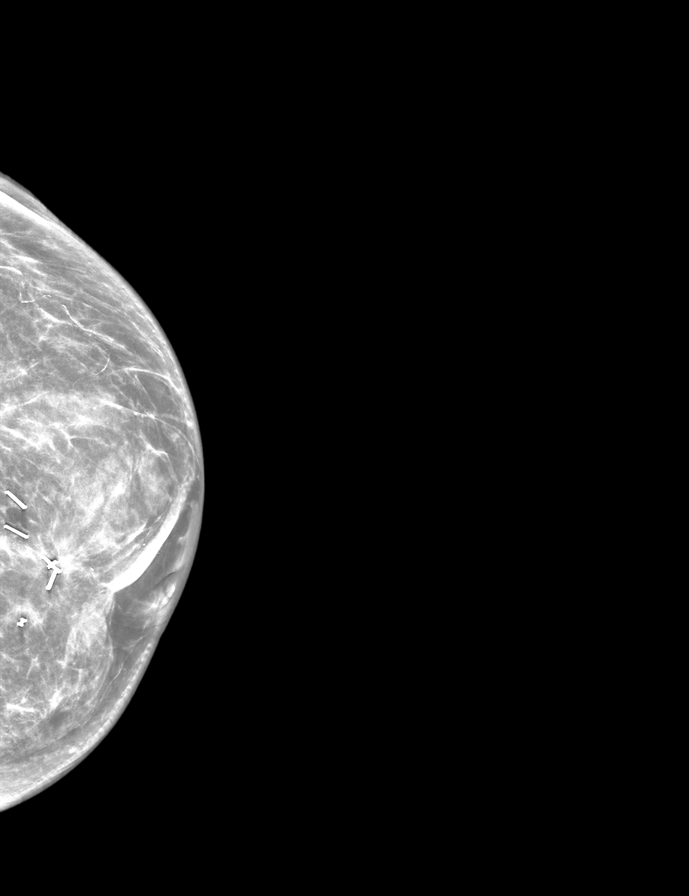

[R CC tomo · tomo slice 23/45.0]
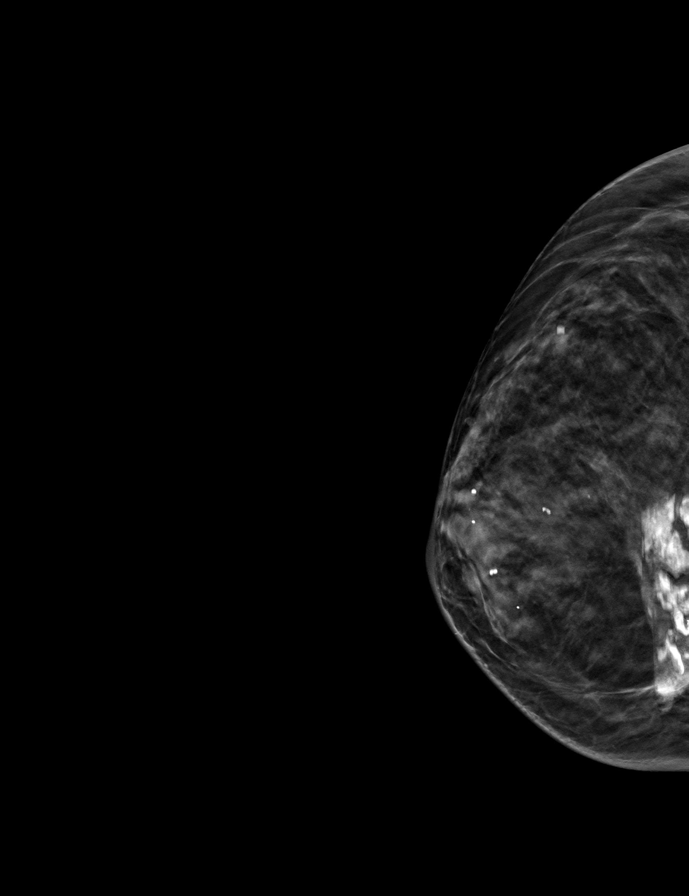

[8 of 27 positions shown; findings below may reference images not displayed]

ACR Breast Density Category c: The breast tissue is heterogeneously
dense, which may obscure small masses.
FINDINGS: Exam demonstrates postlumpectomy changes over the intermittent lower
left breast. There are a few faint microcalcifications adjacent to
the lumpectomy site on the full field images which are less
noticeable on the Mag views of the lumpectomy site. Some of these
are in the skin. Remainder of the left breast as well as the right
breast is unchanged.
IMPRESSION: Postop changes of the left breast. A few probable benign
microcalcifications adjacent to the lumpectomy site.

RECOMMENDATION:
Recommend six-month follow-up diagnostic left breast mammogram with
magnification images to document stability of the
microcalcifications adjacent to the lumpectomy site.

I have discussed the findings and recommendations with the patient.
If applicable, a reminder letter will be sent to the patient
regarding the next appointment.

BI-RADS CATEGORY  3: Probably benign.

## 2021-06-04 ENCOUNTER — Telehealth: Payer: Self-pay | Admitting: *Deleted

## 2021-06-04 DIAGNOSIS — J3081 Allergic rhinitis due to animal (cat) (dog) hair and dander: Secondary | ICD-10-CM | POA: Diagnosis not present

## 2021-06-04 DIAGNOSIS — J3089 Other allergic rhinitis: Secondary | ICD-10-CM | POA: Diagnosis not present

## 2021-06-04 DIAGNOSIS — J301 Allergic rhinitis due to pollen: Secondary | ICD-10-CM | POA: Diagnosis not present

## 2021-06-04 DIAGNOSIS — H1045 Other chronic allergic conjunctivitis: Secondary | ICD-10-CM | POA: Diagnosis not present

## 2021-06-04 NOTE — Telephone Encounter (Signed)
Patient called last week with a question about her bill from 03/12/21 and 03/26/21.  Billing was clarified with Alinda Dooms. Called patient back and informed of billing correction made and patient does not have any payments due at this time. Patient verbalized understanding.  Foye Spurling, BSN, RN Clinical Research Nurse 06/04/2021 2:35 PM

## 2021-06-05 ENCOUNTER — Other Ambulatory Visit: Payer: Self-pay

## 2021-06-05 ENCOUNTER — Ambulatory Visit: Payer: Medicare HMO | Admitting: Cardiovascular Disease

## 2021-06-05 VITALS — BP 108/56 | HR 61 | Ht 62.0 in | Wt 105.0 lb

## 2021-06-05 DIAGNOSIS — Z951 Presence of aortocoronary bypass graft: Secondary | ICD-10-CM

## 2021-06-05 DIAGNOSIS — I2510 Atherosclerotic heart disease of native coronary artery without angina pectoris: Secondary | ICD-10-CM | POA: Diagnosis not present

## 2021-06-05 DIAGNOSIS — I340 Nonrheumatic mitral (valve) insufficiency: Secondary | ICD-10-CM | POA: Diagnosis not present

## 2021-06-05 DIAGNOSIS — E785 Hyperlipidemia, unspecified: Secondary | ICD-10-CM | POA: Diagnosis not present

## 2021-06-05 DIAGNOSIS — Z9861 Coronary angioplasty status: Secondary | ICD-10-CM

## 2021-06-05 NOTE — Assessment & Plan Note (Signed)
History of CAD status post RCA and LAD interventions by Drs. Keenan Bachelor back in May 2020.  I restarted her 06/24/2019 revealing aggressive high-grade in-stent restenosis within the RCA stent which I was unable to cross.  She underwent CABG x1 with a vein to the distal RCA by Dr. Cyndia Bent was discharged home on 07/02/2019.  She is done well since and denies chest pain or shortness of breath.

## 2021-06-05 NOTE — Assessment & Plan Note (Signed)
History of dyslipidemia on statin therapy with lipid profile performed 10/31/2020 revealing a total cholesterol 41, LDL 53 and HDL 71.

## 2021-06-05 NOTE — Progress Notes (Signed)
06/05/2021 Rachel Valentine   1941/08/04  [id number]  Primary Physician [name], MD Primary Cardiologist: [name] MD Garret Reddish, [place], [place]  HPI:  Rachel Valentine is a 80 y.o.   widowed Caucasian female mother of 2, grandmother and one grandchild referred to me initially by Dr. Hosie Poisson, her prior PCP.  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    I last saw her in the office 10/31/2020.  She was having palpitations and chest pain.  Her risk factors include hyperlipidemia and family history with a mother who had CABG.  She is never had a heart attack or stroke.  She was admitted to [facility] on 01/01/2023 unstable angina.  She ruled out for myocardial infarction.  She underwent cardiac catheterization by Dr. Ellyn Hack 01/04/2019 revealing normal LV function, high-grade mid AV groove circumflex on a band and high-grade diffuse calcified RCA stenosis with a fairly normal LAD.  She underwent LAD intervention by Dr. Ellyn Hack with staged diamondback orbital rotational atherectomy, PCI and drug-eluting stenting of the proximal mid and distal RCA by Dr. Angelena Form on 01/06/2019.  She was discharged home the following day.  She is felt well and denies chest pain.  She is on aspirin and Plavix as well as high-dose atorvastatin and a beta-blocker.  She is a fairly active exerciser and currently is back walking 2.5 to 4 miles a day 6 to 7 days a week without limitation.   She underwent outpatient radial diagnostic coronary angiography by myself 06/24/2019 revealing high-grade in-stent restenosis within in the previously placed RCA stents which I was unable to cross.  Based on this she underwent CABG x1 with a vein graft to the distal RCA by Dr. Cyndia Bent during the same hospitalization and was discharged home on 07/02/2019.  She is done well since.  She gets occasional early evening palpitations.   She has  been diagnosed with breast cancer and has finished her chemotherapy and radiation therapy.  She currently is on Herceptin and has 5 sessions left.   She had developed gross hematuria for unclear reasons being worked up by Dr. Jeffie Pollock at [facility] urology.  She is had symptomatic anemia requiring transfusion of 2 units of packed red blood cells.  Hematuria has resolved.   Since I saw her 6 months ago she has continued to do well.  She did have her port removed and had a lumpectomy without spread.  She denies chest pain or shortness of breath.   Current Meds  Medication Sig   anastrozole (ARIMIDEX) 1 MG tablet Take 1 tablet (1 mg total) by mouth daily.   aspirin EC 81 MG tablet Take 81 mg by mouth daily. Swallow whole.   Biotin 10000 MCG TABS Take 10,000 mg by mouth daily.    Calcium-Phosphorus-Vitamin D (CITRACAL +D3 PO) Take 1 tablet by mouth in the morning and at bedtime.    diphenhydrAMINE (BENADRYL) 25 mg capsule Take 25 mg by mouth at bedtime as needed for allergies.    ipratropium (ATROVENT) 0.03 % nasal spray Place 2 sprays into both nostrils daily.   metoprolol succinate (TOPROL-XL) 25 MG 24 hr tablet Take 1 tablet (25 mg total) by mouth daily.   nitroGLYCERIN (NITROSTAT) 0.4 MG SL tablet Place 1 tablet (0.4 mg total) under the tongue every 5 (five) minutes as needed for chest pain.   pantoprazole (PROTONIX) 40 MG tablet TAKE 1 TABLET BY MOUTH  EVERY DAY   Propylene Glycol (SYSTANE BALANCE) 0.6 % SOLN Place 2 drops into both eyes daily as needed (Dry eyes).    rosuvastatin (CRESTOR) 40 MG tablet Take 40 mg by mouth daily.      Allergies  Allergen Reactions   Paclitaxel Rash   Fosamax [Alendronate] Nausea Only    Unset stomach   Prednisone Other (See Comments)    Blood pressure high when she takes it   Clindamycin/Lincomycin Rash   Penicillins Rash    Did it involve swelling of the face/tongue/throat, SOB, or low BP? No Did it involve sudden or severe rash/hives, skin peeling, or any  reaction on the inside of your mouth or nose? No Did you need to seek medical attention at a hospital or doctor's office? Yes When did it last happen?      Childhood If all above answers are "NO", may proceed with cephalosporin use.    Social History   Socioeconomic History   Marital status: Widowed    Spouse name: Not on file   Number of children: Not on file   Years of education: Not on file   Highest education level: Not on file  Occupational History   Not on file  Tobacco Use   Smoking status: Former    Types: Cigarettes    Quit date: 01/06/1999    Years since quitting: 22.4   Smokeless tobacco: Never  Vaping Use   Vaping Use: Never used  Substance and Sexual Activity   Alcohol use: No   Drug use: Never   Sexual activity: Not on file  Other Topics Concern   Not on file  Social History Narrative   Not on file   Social Determinants of Health   Financial Resource Strain: Not on file  Food Insecurity: Not on file  Transportation Needs: Not on file  Physical Activity: Not on file  Stress: Not on file  Social Connections: Not on file  Intimate Partner Violence: Not on file     Review of Systems: General: negative for chills, fever, night sweats or weight changes.  Cardiovascular: negative for chest pain, dyspnea on exertion, edema, orthopnea, palpitations, paroxysmal nocturnal dyspnea or shortness of breath Dermatological: negative for rash Respiratory: negative for cough or wheezing Urologic: negative for hematuria Abdominal: negative for nausea, vomiting, diarrhea, bright red blood per rectum, melena, or hematemesis Neurologic: negative for visual changes, syncope, or dizziness All other systems reviewed and are otherwise negative except as noted above.    Blood pressure (!) 108/56, pulse 61, height 5\' 2"  (1.575 m), weight 105 lb (47.6 kg).  General appearance: alert and no distress Neck: no adenopathy, no carotid bruit, no JVD, supple, symmetrical, trachea  midline, and thyroid not enlarged, symmetric, no tenderness/mass/nodules Lungs: clear to auscultation bilaterally Heart: regular rate and rhythm, S1, S2 normal, no murmur, click, rub or gallop Extremities: extremities normal, atraumatic, no cyanosis or edema Pulses: 2+ and symmetric Skin: Skin color, texture, turgor normal. No rashes or lesions Neurologic: Grossly normal  EKG sinus rhythm at 61 with low limb voltage.  I personally reviewed this EKG.  ASSESSMENT AND PLAN:   CAD S/P percutaneous coronary angioplasty History of CAD status post RCA and LAD interventions by Drs. Keenan Bachelor back in May 2020.  I restarted her 06/24/2019 revealing aggressive high-grade in-stent restenosis within the RCA stent which I was unable to cross.  She underwent CABG x1 with a vein to the distal RCA by Dr. Cyndia Bent was discharged home on 07/02/2019.  She is done well since and denies chest pain or shortness of breath.  Dyslipidemia, goal LDL below 70 History of dyslipidemia on statin therapy with lipid profile performed 10/31/2020 revealing a total cholesterol 41, LDL 53 and HDL 71.  Mitral regurgitation 2D echocardiogram performed 11/03/2020 showed moderate mitral regurgitation with normal LV function and grade 2 diastolic dysfunction.  We will repeat 2D echo in March 2023.     [name] MD FACP,FACC,FAHA, FSCAI 06/05/2021 11:00 AM

## 2021-06-05 NOTE — Assessment & Plan Note (Signed)
2D echocardiogram performed 11/03/2020 showed moderate mitral regurgitation with normal LV function and grade 2 diastolic dysfunction.  We will repeat 2D echo in March 2023.

## 2021-06-05 NOTE — Patient Instructions (Addendum)
Medication Instructions:  Your physician recommends that you continue on your current medications as directed. Please refer to the Current Medication list given to you today.  *If you need a refill on your cardiac medications before your next appointment, please call your pharmacy*   Testing/Procedures: Your physician has requested that you have an echocardiogram. Echocardiography is a painless test that uses sound waves to create images of your heart. It provides your doctor with information about the size and shape of your heart and how well your heart's chambers and valves are working. This procedure takes approximately one hour. There are no restrictions for this procedure. To be done in March of 2023. This procedure is done at [address].     Follow-Up: At [facility], you and your health needs are our priority.  As part of our continuing mission to provide you with exceptional heart care, we have created designated Provider Care Teams.  These Care Teams include your primary Cardiologist (physician) and Advanced Practice Providers (APPs -  Physician Assistants and Nurse Practitioners) who all work together to provide you with the care you need, when you need it.  We recommend signing up for the patient portal called "MyChart".  Sign up information is provided on this After Visit Summary.  MyChart is used to connect with patients for Virtual Visits (Telemedicine).  Patients are able to view lab/test results, encounter notes, upcoming appointments, etc.  Non-urgent messages can be sent to your provider as well.   To learn more about what you can do with MyChart, go to [email or web address].    Your next appointment:   12 month(s)  The format for your next appointment:   In Person  Provider:   Quay Burow, MD

## 2021-06-14 ENCOUNTER — Telehealth: Payer: Self-pay

## 2021-06-14 DIAGNOSIS — H2513 Age-related nuclear cataract, bilateral: Secondary | ICD-10-CM | POA: Diagnosis not present

## 2021-06-14 DIAGNOSIS — H2511 Age-related nuclear cataract, right eye: Secondary | ICD-10-CM | POA: Diagnosis not present

## 2021-06-14 DIAGNOSIS — H353131 Nonexudative age-related macular degeneration, bilateral, early dry stage: Secondary | ICD-10-CM | POA: Diagnosis not present

## 2021-06-14 DIAGNOSIS — H18413 Arcus senilis, bilateral: Secondary | ICD-10-CM | POA: Diagnosis not present

## 2021-06-14 DIAGNOSIS — H25013 Cortical age-related cataract, bilateral: Secondary | ICD-10-CM | POA: Diagnosis not present

## 2021-06-14 DIAGNOSIS — H43822 Vitreomacular adhesion, left eye: Secondary | ICD-10-CM | POA: Diagnosis not present

## 2021-06-14 NOTE — Telephone Encounter (Signed)
   [facility] Pre-operative Risk Assessment    Patient Name: Joselynne Killam  DOB: 02/26/1941 MRN: [id number]  HEARTCARE STAFF:  - IMPORTANT!!!!!! Under Visit Info/Reason for Call, type in Other and utilize the format Clearance MM/DD/YY or Clearance TBD. Do not use dashes or single digits. - Please review there is not already an duplicate clearance open for this procedure. - If request is for dental extraction, please clarify the # of teeth to be extracted. - If the patient is currently at the dentist's office, call Pre-Op Callback Staff (MA/nurse) to input urgent request.  - If the patient is not currently in the dentist office, please route to the Pre-Op pool.  Request for surgical clearance:  What type of surgery is being performed? Cataract extraction with intraocular lens implantation of the right eye followed by the left eye  When is this surgery scheduled? TBD  What type of clearance is required (medical clearance vs. Pharmacy clearance to hold med vs. Both)? Medical  Are there any medications that need to be held prior to surgery and how long? Aspirin  Practice name and name of physician performing surgery? [facility] [place]; Dr. Blanca Friend, Dr. Rolanda Jay  What is the office phone number? [phone]   7.   What is the office fax number? [phone]  8.   Anesthesia type (None, local, MAC, general) ? Topical anesthesia with IV medication    Orvan July 06/14/2021, 5:17 PM  _________________________________________________________________   (provider comments below)

## 2021-06-15 NOTE — Telephone Encounter (Signed)
   Patient Name: Rachel Valentine [facility]  DOB: April 09, 1941 MRN: [id number]  Primary Cardiologist: Quay Burow, MD  Chart reviewed as part of pre-operative protocol coverage. Cataract extractions are recognized in guidelines as low risk surgeries that do not typically require specific preoperative testing or holding of blood thinner therapy. Therefore, given past medical history and time since last visit, based on ACC/AHA guidelines, [name] would be at acceptable risk for the planned procedure without further cardiovascular testing.   I will route this recommendation to the requesting party via Epic fax function and remove from pre-op pool.  Please call with questions.  Deberah Pelton, NP 06/15/2021, 7:32 AM

## 2021-06-22 DIAGNOSIS — I25119 Atherosclerotic heart disease of native coronary artery with unspecified angina pectoris: Secondary | ICD-10-CM | POA: Diagnosis not present

## 2021-06-22 DIAGNOSIS — K219 Gastro-esophageal reflux disease without esophagitis: Secondary | ICD-10-CM | POA: Diagnosis not present

## 2021-06-22 DIAGNOSIS — R69 Illness, unspecified: Secondary | ICD-10-CM | POA: Diagnosis not present

## 2021-06-22 DIAGNOSIS — I2510 Atherosclerotic heart disease of native coronary artery without angina pectoris: Secondary | ICD-10-CM | POA: Diagnosis not present

## 2021-06-22 DIAGNOSIS — M810 Age-related osteoporosis without current pathological fracture: Secondary | ICD-10-CM | POA: Diagnosis not present

## 2021-06-22 DIAGNOSIS — I10 Essential (primary) hypertension: Secondary | ICD-10-CM | POA: Diagnosis not present

## 2021-06-22 DIAGNOSIS — E7800 Pure hypercholesterolemia, unspecified: Secondary | ICD-10-CM | POA: Diagnosis not present

## 2021-06-25 ENCOUNTER — Other Ambulatory Visit: Payer: Self-pay | Admitting: Cardiovascular Disease

## 2021-06-25 DIAGNOSIS — K2100 Gastro-esophageal reflux disease with esophagitis, without bleeding: Secondary | ICD-10-CM

## 2021-07-10 ENCOUNTER — Ambulatory Visit: Payer: Medicare HMO

## 2021-09-08 ENCOUNTER — Other Ambulatory Visit: Payer: Self-pay | Admitting: Hematology and Oncology

## 2021-09-12 DIAGNOSIS — H2511 Age-related nuclear cataract, right eye: Secondary | ICD-10-CM | POA: Diagnosis not present

## 2021-09-13 DIAGNOSIS — H2512 Age-related nuclear cataract, left eye: Secondary | ICD-10-CM | POA: Diagnosis not present

## 2021-09-18 ENCOUNTER — Ambulatory Visit
Admission: RE | Admit: 2021-09-18 | Discharge: 2021-09-18 | Disposition: A | Discharge status: Home / Self Care | Payer: Medicare HMO | Source: Ambulatory Visit | Attending: Adult Health | Admitting: Adult Health

## 2021-09-18 DIAGNOSIS — Z170 Estrogen receptor positive status [ER+]: Secondary | ICD-10-CM

## 2021-09-18 DIAGNOSIS — Z780 Asymptomatic menopausal state: Secondary | ICD-10-CM | POA: Diagnosis not present

## 2021-09-18 DIAGNOSIS — M85851 Other specified disorders of bone density and structure, right thigh: Secondary | ICD-10-CM | POA: Diagnosis not present

## 2021-09-18 DIAGNOSIS — M810 Age-related osteoporosis without current pathological fracture: Secondary | ICD-10-CM | POA: Diagnosis not present

## 2021-09-18 DIAGNOSIS — C50512 Malignant neoplasm of lower-outer quadrant of left female breast: Secondary | ICD-10-CM

## 2021-09-22 NOTE — Progress Notes (Signed)
Patient Care Team: [name], MD as PCP - General (Family Medicine) [name], MD as PCP - Cardiology (Cardiology) [name], MD as Surgeon (General Surgery) Kyung Rudd, MD as Consulting Physician (Radiation Oncology) Nicholas Lose, MD as Consulting Physician (Hematology and Oncology)  DIAGNOSIS:    ICD-10-CM   1. Malignant neoplasm of lower-outer quadrant of left breast of female, estrogen receptor positive ([place])  C50.512    Z17.0       SUMMARY OF ONCOLOGIC HISTORY: Oncology History  Malignant neoplasm of lower-outer quadrant of left breast of female, estrogen receptor positive ([place])  01/18/2020 Initial Diagnosis   Palpable left breast lump with calcifications at 5 o'clock position measuring 3.3 cm.  Biopsy revealed grade 3 IDC ER 95%, PR 0%, HER-2 3+ positive, Ki-67 50%, axilla negative, T2N0   01/26/2020 Cancer Staging   Staging form: Breast, AJCC 8th Edition - Clinical stage from 01/26/2020: Stage IIA (cT2, cN0, cM0, G3, ER+, PR-, HER2+) - Signed by Nicholas Lose, MD on 01/26/2020    02/16/2020 - 11/16/2020 Neo-Adjuvant Chemotherapy   Neoadjuvant weekly Taxol/Herceptin x 12 from 02/16/2020-05/13/2020, followed by Herceptin maintenance every 3 weeks, last given on 11/16/2020 stopped early due to increased fatigue.     06/15/2020 Surgery   Left lumpectomy ([facility]): no residual carcinoma, 4 left axillary lymph nodes negative for carcinoma   06/15/2020 Cancer Staging   Staging form: Breast, AJCC 8th Edition - Pathologic stage from 06/15/2020: No Stage Recommended (ypT0, pN0, cM0) - Signed by Gardenia Phlegm, NP on 03/07/2021 Stage prefix: Post-therapy Response to neoadjuvant therapy: Complete response    07/23/2020 - 08/22/2020 Radiation Therapy   Adjuvant radiation [facility]): The patient initially received a dose of 42.56 Gy in 16 fractions to the breast using whole-breast tangent fields. This was delivered using a 3-D conformal technique. The  patient then received a boost to the seroma. This delivered an additional 8 Gy in 4 fractions using a 3 field photon technique due to the depth of the seroma. The total dose was 50.56 Gy.   09/2020 -  Anti-estrogen oral therapy   Anastrozole daily     CHIEF COMPLIANT: Follow-up of left breast cancer   INTERVAL HISTORY: Rachel Valentine is a 81 y.o. with above-mentioned history of  left breast cancer who completed neoadjuvant chemotherapy, underwent a left lumpectomy, completed radiation, and also ompleted Herceptin. Mammogram on 05/19/2021 showed a few probable benign microcalcifications adjacent to lumpectomy site. She presents to the clinic today for follow up.  She does have joint aches and stiffness related to antiestrogen therapy but otherwise doing quite well.  ALLERGIES:  is allergic to paclitaxel, fosamax [alendronate], prednisone, clindamycin/lincomycin, and penicillins.  MEDICATIONS:  Current Outpatient Medications  Medication Sig Dispense Refill   anastrozole (ARIMIDEX) 1 MG tablet TAKE 1 TABLET BY MOUTH EVERY DAY 90 tablet 3   aspirin EC 81 MG tablet Take 81 mg by mouth daily. Swallow whole.     Biotin 10000 MCG TABS Take 10,000 mg by mouth daily.      Calcium-Phosphorus-Vitamin D (CITRACAL +D3 PO) Take 1 tablet by mouth in the morning and at bedtime.      diphenhydrAMINE (BENADRYL) 25 mg capsule Take 25 mg by mouth at bedtime as needed for allergies.      ipratropium (ATROVENT) 0.03 % nasal spray Place 2 sprays into both nostrils daily.     metoprolol succinate (TOPROL-XL) 25 MG 24 hr tablet Take 1 tablet (25 mg total) by mouth daily. 90 tablet 3  nitroGLYCERIN (NITROSTAT) 0.4 MG SL tablet Place 1 tablet (0.4 mg total) under the tongue every 5 (five) minutes as needed for chest pain. 30 tablet 0   pantoprazole (PROTONIX) 40 MG tablet TAKE 1 TABLET BY MOUTH EVERY DAY 90 tablet 2   Propylene Glycol (SYSTANE BALANCE) 0.6 % SOLN Place 2 drops into both eyes daily as needed  (Dry eyes).      rosuvastatin (CRESTOR) 40 MG tablet Take 40 mg by mouth daily.      No current facility-administered medications for this visit.    PHYSICAL EXAMINATION: ECOG PERFORMANCE STATUS: 1 - Symptomatic but completely ambulatory  Vitals:   09/24/21 1356  BP: (!) 165/57  Pulse: 74  Resp: 18  Temp: (!) 97.3 F (36.3 C)  SpO2: 99%   Filed Weights   09/24/21 1356  Weight: 111 lb 14.4 oz (50.8 kg)    BREAST: No palpable masses or nodules in either right or left breasts. No palpable axillary supraclavicular or infraclavicular adenopathy no breast tenderness or nipple discharge. (exam performed in the presence of a chaperone)  LABORATORY DATA:  I have reviewed the data as listed CMP Latest Ref Rng & Units 04/20/2021 03/12/2021 01/30/2021  Glucose 70 - 99 mg/dL 98 91 112(H)  BUN 8 - 23 mg/dL 19 20 22   Creatinine 0.44 - 1.00 mg/dL 0.80 0.80 1.05(H)  Sodium 135 - 145 mmol/L 142 140 137  Potassium 3.5 - 5.1 mmol/L 4.2 4.5 4.8  Chloride 98 - 111 mmol/L 105 104 101  CO2 22 - 32 mmol/L 28 28 29   Calcium 8.9 - 10.3 mg/dL 9.6 10.3 9.5  Total Protein 6.5 - 8.1 g/dL 7.0 7.6 6.7  Total Bilirubin 0.3 - 1.2 mg/dL 0.4 0.4 0.5  Alkaline Phos 38 - 126 U/L 78 76 68  AST 15 - 41 U/L 18 19 25   ALT 0 - 44 U/L 7 7 12     Lab Results  Component Value Date   WBC 5.5 04/20/2021   HGB 12.2 04/20/2021   HCT 37.9 04/20/2021   MCV 92.0 04/20/2021   PLT 264 04/20/2021   NEUTROABS 3.8 04/20/2021    ASSESSMENT & PLAN:  Malignant neoplasm of lower-outer quadrant of left breast of female, estrogen receptor positive ([place]) 01/18/2020:Palpable left breast lump with calcifications at 5 o'clock position measuring 3.3 cm.  Biopsy revealed grade 3 IDC ER 95%, PR 0%, HER-2 3+ positive, Ki-67 50%, axilla negative, T2N0 stage IIa clinical stage Bilateral implants removed 2005   Treatment plan: 1.  Neo- adjuvant chemotherapy with Taxol Herceptin  2.  Breast conserving surgery with sentinel lymph node  biopsy 3.  Adjuvant radiation therapy completed 08/21/2020 4.  Adjuvant antiestrogen therapy with anastrozole 1 mg daily x5 years   Breast MRI: 2.3 cm mass, indeterminate 8 mm mass (Biopsy: ADH), no LN --------------------------------------------------------------------------------------------------------------------------------------------------------------- 06/15/20: Left lumpectomy ([facility]): no residual carcinoma, 4 left axillary lymph nodes negative for carcinoma   Plan: 1. Anti-HER-2 therapy  11/16/2020 (stopped early due to fatigue) 2. adjuvant radiation therapy completed 08/21/2020 3.  Adjuvant antiestrogen therapy with anastrozole 1 mg daily x5 years  started January 2022) --------------------------------------------------------------------------------------------------------------------   Anastrozole toxicities : Denies any adverse effects to anastrozole.  Denies any hot flashes or myalgias. Osteoporosis: In 2018 at bone density was T score -2.6:  Prolia injections every 6 months.    Breast cancer surveillance: 1.  Breast exam 09/24/2021: Benign 2. mammogram 05/19/2021: Benign breast density category C Bone density 09/18/2021: T score -2.4: Severe osteopenia but improved from 2018 (  was -2.6): We discussed different options for osteoporosis and I instructed her that she could try Reclast once a year as well.  She tells me that her out-of-pocket expense for each Prolia was $225.  She will find out and figure out if Reclast would be cheaper.  She will inform us of her decision.  Mild peripheral neuropathy in the toes of her right leg: Improving slowly with time. She plans to do the planning for her 81th birthday at [place].  Return to clinic in 1 year for follow-up.    No orders of the defined types were placed in this encounter.  The patient has a good understanding of the overall plan. she agrees with it. she will call with any problems that may develop before the next visit  here.  Total time spent: 20 mins including face to face time and time spent for planning, charting and coordination of care  Rulon Eisenmenger, MD, MPH 09/24/2021  I, Thana Ates, am acting as scribe for Dr. Nicholas Lose.  I have reviewed the above documentation for accuracy and completeness, and I agree with the above.

## 2021-09-24 ENCOUNTER — Inpatient Hospital Stay: Payer: Medicare HMO | Attending: Hematology and Oncology | Admitting: Hematology and Oncology

## 2021-09-24 ENCOUNTER — Other Ambulatory Visit: Payer: Self-pay

## 2021-09-24 DIAGNOSIS — Z923 Personal history of irradiation: Secondary | ICD-10-CM | POA: Diagnosis not present

## 2021-09-24 DIAGNOSIS — Z170 Estrogen receptor positive status [ER+]: Secondary | ICD-10-CM | POA: Insufficient documentation

## 2021-09-24 DIAGNOSIS — Z9221 Personal history of antineoplastic chemotherapy: Secondary | ICD-10-CM | POA: Diagnosis not present

## 2021-09-24 DIAGNOSIS — C50512 Malignant neoplasm of lower-outer quadrant of left female breast: Secondary | ICD-10-CM | POA: Insufficient documentation

## 2021-09-24 DIAGNOSIS — Z79811 Long term (current) use of aromatase inhibitors: Secondary | ICD-10-CM | POA: Insufficient documentation

## 2021-09-24 DIAGNOSIS — Z79899 Other long term (current) drug therapy: Secondary | ICD-10-CM | POA: Insufficient documentation

## 2021-09-24 NOTE — Assessment & Plan Note (Signed)
01/18/2020:Palpable left breast lump with calcifications at 5 o'clock position measuring 3.3 cm. Biopsy revealed grade 3 IDC ER 95%, PR 0%, HER-2 3+ positive, Ki-67 50%, axilla negative, T2N0 stage IIa clinical stage Bilateral implants removed 2005  Treatment plan: 1.Neo- adjuvant chemotherapy with Taxol Herceptin 2.Breast conserving surgery with sentinel lymph node biopsy 3.Adjuvant radiation therapycompleted 08/21/2020 4.Adjuvant antiestrogen therapywith anastrozole 1 mg daily x5 years  Breast MRI: 2.3 cm mass, indeterminate 8 mm mass (Biopsy: ADH), no LN --------------------------------------------------------------------------------------------------------------------------------------------------------------- 06/15/20:Left lumpectomy ([facility]): no residual carcinoma, 4 left axillary lymph nodes negative for carcinoma  Plan: 1.Anti-HER-2 therapy 11/16/2020 (stopped early due to fatigue) 2.adjuvant radiation therapycompleted 08/21/2020 3.Adjuvant antiestrogen therapywith anastrozole 1 mg daily x5 yearsstartedJanuary 2022) --------------------------------------------------------------------------------------------------------------------  Anastrozoletoxicities:Denies any adverse effects to anastrozole. Denies any hot flashes or myalgias. Osteoporosis: In 2018 at bone density was T score -2.6: Prolia injections every 6 months.   Severe fatigue: Improved since she stopped Herceptin treatment.  Breast cancer surveillance: 1.  Breast exam 09/24/2021: Benign 2. mammogram 05/19/2021: Benign breast density category C Bone density 09/18/2021: T score -2.4: Severe osteopenia but improved from 2018 (was -2.6): Continue with Prolia injections  Return to clinic in 1 year for follow-up.

## 2021-09-25 ENCOUNTER — Telehealth: Payer: Self-pay | Admitting: Hematology and Oncology

## 2021-09-25 NOTE — Telephone Encounter (Signed)
Scheduled appointment per 1/23 los. Patient is aware. Patient will be mailed updated calendar.

## 2021-09-26 DIAGNOSIS — H2512 Age-related nuclear cataract, left eye: Secondary | ICD-10-CM | POA: Diagnosis not present

## 2021-10-02 DIAGNOSIS — M810 Age-related osteoporosis without current pathological fracture: Secondary | ICD-10-CM | POA: Diagnosis not present

## 2021-10-02 DIAGNOSIS — I10 Essential (primary) hypertension: Secondary | ICD-10-CM | POA: Diagnosis not present

## 2021-10-02 DIAGNOSIS — N898 Other specified noninflammatory disorders of vagina: Secondary | ICD-10-CM | POA: Diagnosis not present

## 2021-10-02 DIAGNOSIS — R350 Frequency of micturition: Secondary | ICD-10-CM | POA: Diagnosis not present

## 2021-10-11 ENCOUNTER — Encounter (HOSPITAL_COMMUNITY): Payer: Self-pay

## 2021-10-15 ENCOUNTER — Inpatient Hospital Stay: Payer: Medicare HMO

## 2021-10-18 ENCOUNTER — Other Ambulatory Visit: Payer: Self-pay | Admitting: Cardiovascular Disease

## 2021-10-22 ENCOUNTER — Other Ambulatory Visit: Payer: Self-pay

## 2021-10-22 ENCOUNTER — Other Ambulatory Visit: Payer: Self-pay | Admitting: Hematology and Oncology

## 2021-10-22 ENCOUNTER — Inpatient Hospital Stay: Payer: Medicare HMO

## 2021-10-22 ENCOUNTER — Inpatient Hospital Stay: Payer: Medicare HMO | Attending: Hematology and Oncology

## 2021-10-22 VITALS — BP 129/57 | HR 66 | Temp 98.9°F | Resp 16

## 2021-10-22 DIAGNOSIS — Z9221 Personal history of antineoplastic chemotherapy: Secondary | ICD-10-CM | POA: Diagnosis not present

## 2021-10-22 DIAGNOSIS — Z923 Personal history of irradiation: Secondary | ICD-10-CM | POA: Diagnosis not present

## 2021-10-22 DIAGNOSIS — C50512 Malignant neoplasm of lower-outer quadrant of left female breast: Secondary | ICD-10-CM | POA: Diagnosis not present

## 2021-10-22 DIAGNOSIS — Z170 Estrogen receptor positive status [ER+]: Secondary | ICD-10-CM | POA: Insufficient documentation

## 2021-10-22 DIAGNOSIS — Z95828 Presence of other vascular implants and grafts: Secondary | ICD-10-CM

## 2021-10-22 DIAGNOSIS — Z79811 Long term (current) use of aromatase inhibitors: Secondary | ICD-10-CM | POA: Insufficient documentation

## 2021-10-22 LAB — CMP (CANCER CENTER ONLY)
ALT: 8 U/L (ref 0–44)
AST: 18 U/L (ref 15–41)
Albumin: 4.5 g/dL (ref 3.5–5.0)
Alkaline Phosphatase: 69 U/L (ref 38–126)
Anion gap: 3 — ABNORMAL LOW (ref 5–15)
BUN: 20 mg/dL (ref 8–23)
CO2: 30 mmol/L (ref 22–32)
Calcium: 9.5 mg/dL (ref 8.9–10.3)
Chloride: 106 mmol/L (ref 98–111)
Creatinine: 0.75 mg/dL (ref 0.44–1.00)
GFR, Estimated: >60 mL/min (ref >60)
Glucose, Bld: 88 mg/dL (ref 70–99)
Potassium: 4.8 mmol/L (ref 3.5–5.1)
Sodium: 139 mmol/L (ref 135–145)
Total Bilirubin: 0.4 mg/dL (ref 0.3–1.2)
Total Protein: 7.1 g/dL (ref 6.5–8.1)

## 2021-10-22 LAB — CBC WITH DIFFERENTIAL (CANCER CENTER ONLY)
Abs Immature Granulocytes: 0.01 10*3/uL (ref 0.00–0.07)
Basophils Absolute: 0 10*3/uL (ref 0.0–0.1)
Basophils Relative: 1 %
Eosinophils Absolute: 0.5 10*3/uL (ref 0.0–0.5)
Eosinophils Relative: 9 %
HCT: 40 % (ref 36.0–46.0)
Hemoglobin: 12.8 g/dL (ref 12.0–15.0)
Immature Granulocytes: 0 %
Lymphocytes Relative: 18 %
Lymphs Abs: 0.9 10*3/uL (ref 0.7–4.0)
MCH: 30 pg (ref 26.0–34.0)
MCHC: 32 g/dL (ref 30.0–36.0)
MCV: 93.9 fL (ref 80.0–100.0)
Monocytes Absolute: 0.4 10*3/uL (ref 0.1–1.0)
Monocytes Relative: 8 %
Neutro Abs: 3.3 10*3/uL (ref 1.7–7.7)
Neutrophils Relative %: 64 %
Platelet Count: 246 10*3/uL (ref 150–400)
RBC: 4.26 MIL/uL (ref 3.87–5.11)
RDW: 14.1 % (ref 11.5–15.5)
WBC Count: 5.2 10*3/uL (ref 4.0–10.5)
nRBC: 0 % (ref 0.0–0.2)

## 2021-10-22 MED ORDER — DENOSUMAB 60 MG/ML ~~LOC~~ SOSY
60.0000 mg | PREFILLED_SYRINGE | Freq: Once | SUBCUTANEOUS | Status: AC
  Administered 2021-10-22: 60 mg via SUBCUTANEOUS
  Filled 2021-10-22: qty 1

## 2021-11-07 ENCOUNTER — Other Ambulatory Visit: Payer: Self-pay

## 2021-11-07 ENCOUNTER — Ambulatory Visit (HOSPITAL_COMMUNITY): Payer: Medicare HMO | Attending: Cardiovascular Disease

## 2021-11-07 DIAGNOSIS — I340 Nonrheumatic mitral (valve) insufficiency: Secondary | ICD-10-CM | POA: Diagnosis not present

## 2021-11-07 LAB — ECHOCARDIOGRAM COMPLETE
AR max vel: 1.45 cm2
AV Area VTI: 1.39 cm2
AV Area mean vel: 1.34 cm2
AV Mean grad: 7.5 mmHg
AV Peak grad: 13.3 mmHg
Ao pk vel: 1.82 m/s
Area-P 1/2: 3.5 cm2
P 1/2 time: 426 msec
S' Lateral: 2.3 cm

## 2021-11-16 ENCOUNTER — Encounter: Payer: Self-pay | Admitting: Cardiovascular Disease

## 2021-11-16 ENCOUNTER — Ambulatory Visit (INDEPENDENT_AMBULATORY_CARE_PROVIDER_SITE_OTHER): Payer: Medicare HMO

## 2021-11-16 DIAGNOSIS — R002 Palpitations: Secondary | ICD-10-CM

## 2021-11-16 NOTE — Progress Notes (Unsigned)
Enrolled patient for a 14 day Zio XT  monitor to be mailed to patients home  °

## 2021-11-19 ENCOUNTER — Ambulatory Visit
Admission: RE | Admit: 2021-11-19 | Discharge: 2021-11-19 | Disposition: A | Discharge status: Home / Self Care | Payer: Medicare HMO | Source: Ambulatory Visit | Attending: Adult Health | Admitting: Adult Health

## 2021-11-19 ENCOUNTER — Other Ambulatory Visit: Payer: Self-pay | Admitting: Adult Health

## 2021-11-19 DIAGNOSIS — R921 Mammographic calcification found on diagnostic imaging of breast: Secondary | ICD-10-CM

## 2021-11-19 DIAGNOSIS — R922 Inconclusive mammogram: Secondary | ICD-10-CM | POA: Diagnosis not present

## 2021-11-19 HISTORY — DX: Personal history of irradiation: Z92.3

## 2021-11-19 HISTORY — DX: Personal history of antineoplastic chemotherapy: Z92.21

## 2021-11-19 IMAGING — MG MM DIGITAL DIAGNOSTIC UNILAT*L* W/ TOMO W/ CAD
6 series · 6 of 14 positions shown · non-contrast
Comparison: Previous exam(s).

CLINICAL DATA: 80-year-old female presenting for six-month
follow-up of probably benign left breast calcifications in the
lumpectomy bed.

EXAM:
DIGITAL DIAGNOSTIC UNILATERAL LEFT MAMMOGRAM WITH TOMOSYNTHESIS AND
CAD
TECHNIQUE: Left digital diagnostic mammography and breast tomosynthesis was
performed. The images were evaluated with computer-aided detection.

[L ML]
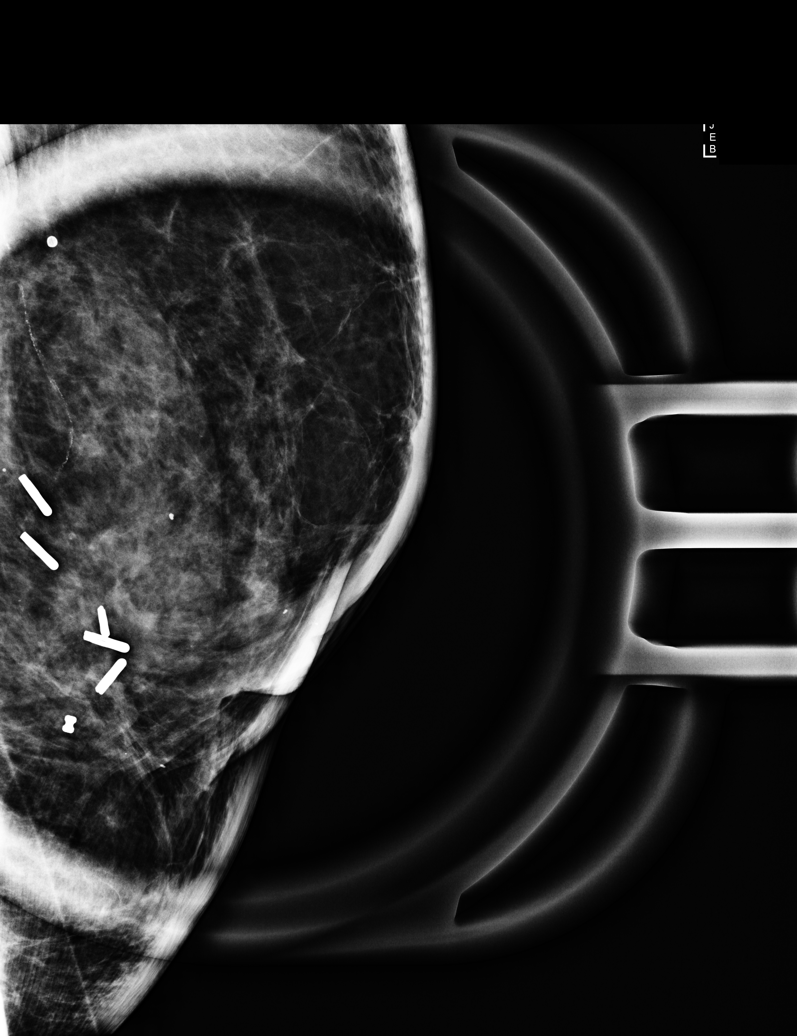

[L CC]
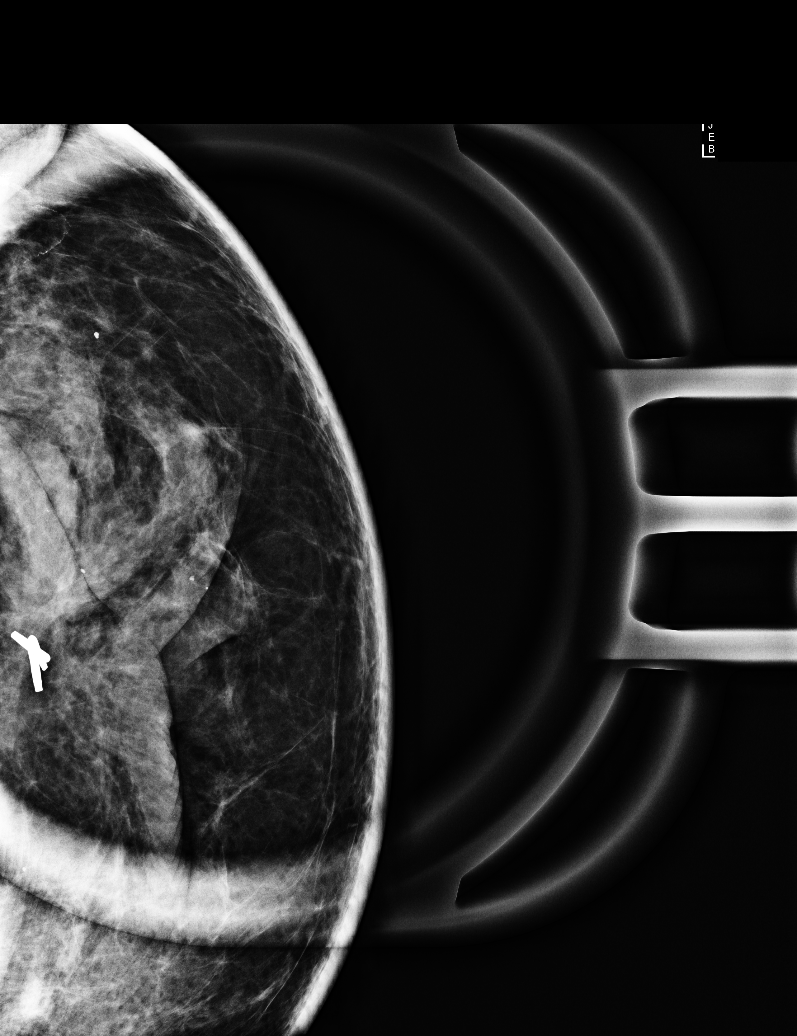

[L CC synth-2D]
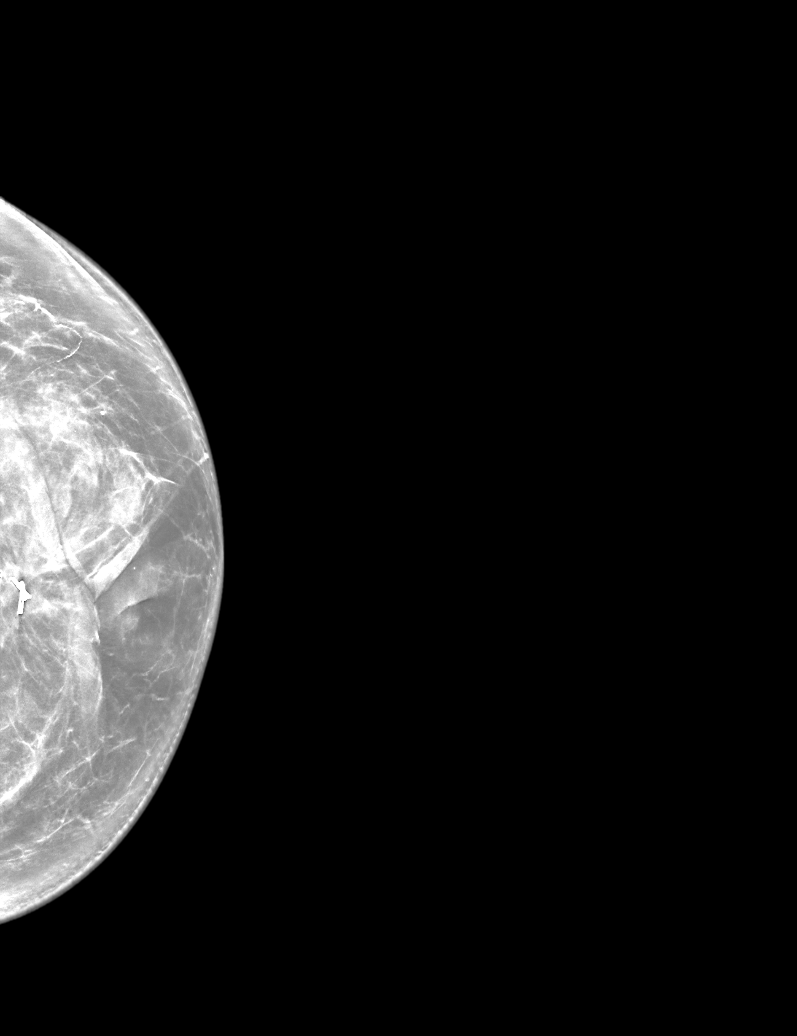

[L MLO synth-2D]
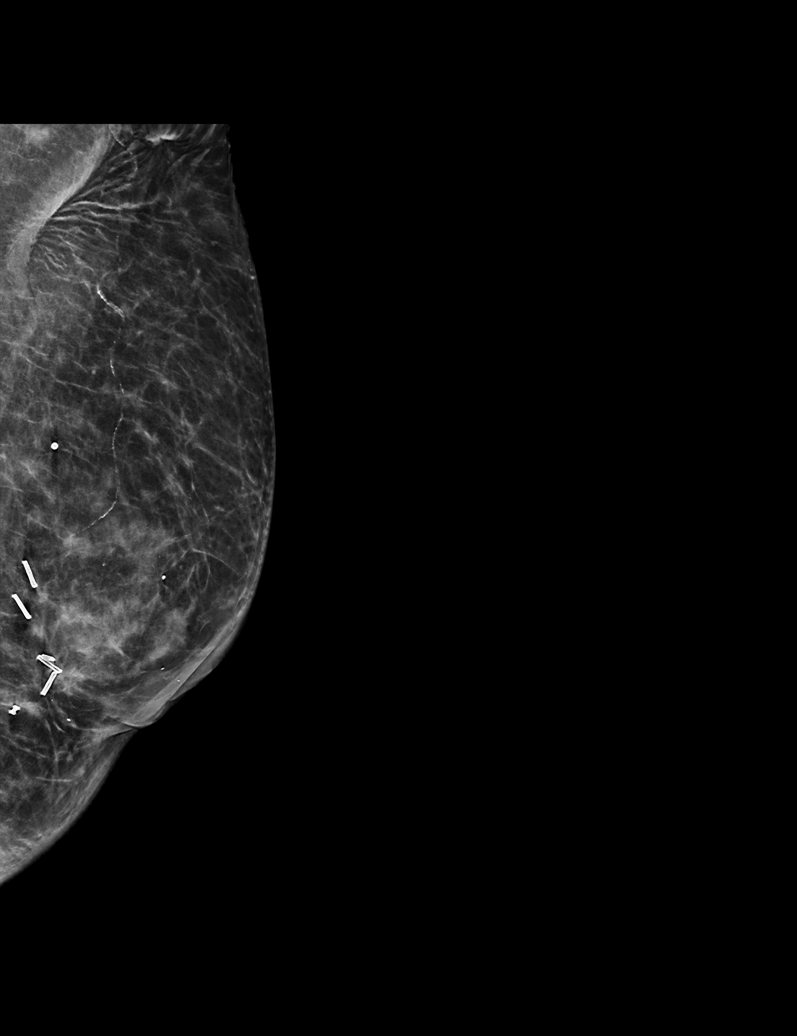

[L MLO tomo · tomo slice 33/64.0]
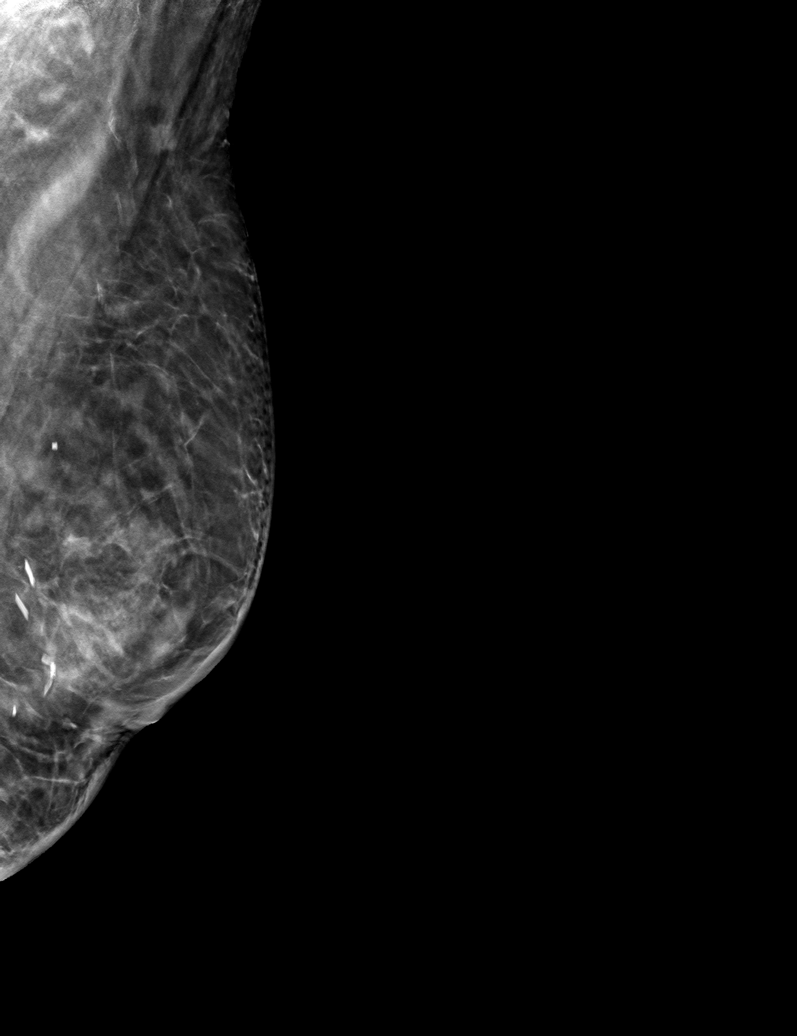

[L CC tomo · tomo slice 37/73.0]
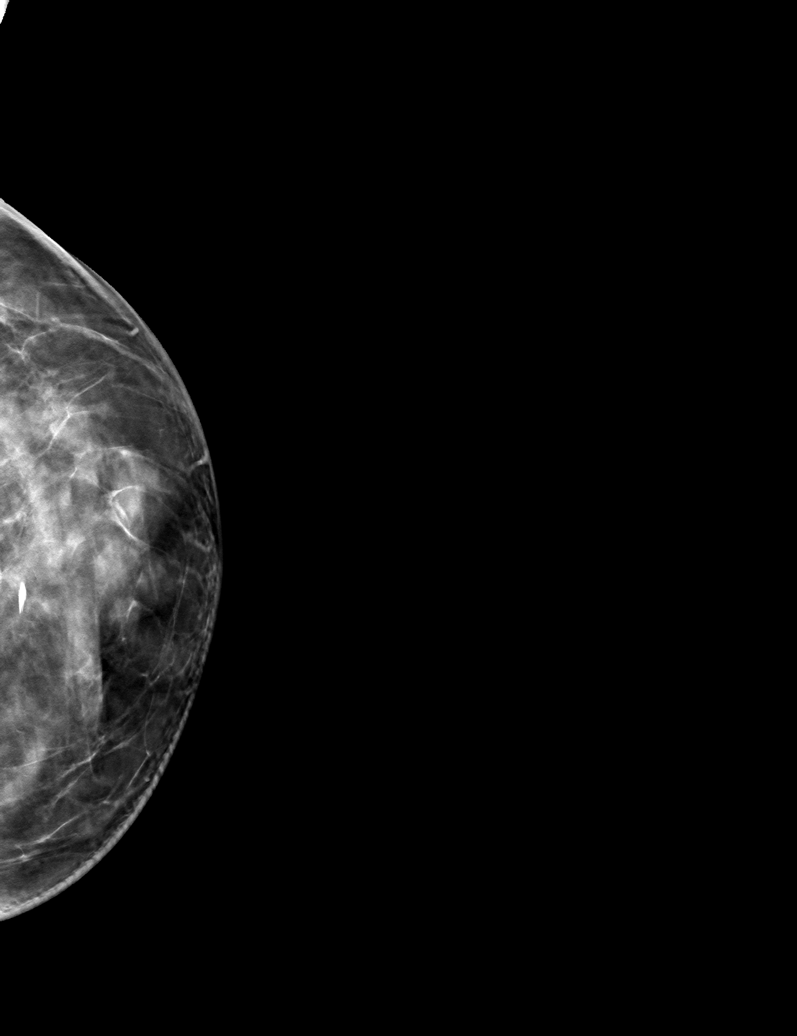

[6 of 14 positions shown; findings below may reference images not displayed]

ACR Breast Density Category c: The breast tissue is heterogeneously
dense, which may obscure small masses.
FINDINGS: Loosely grouped punctate calcifications are mammographically stable.
No new or suspicious findings in either breast.
IMPRESSION: Stable, probably benign left breast calcifications. Recommend
continued short-term follow-up.

RECOMMENDATION:
Bilateral diagnostic mammogram in [DATE].

I have discussed the findings and recommendations with the patient.
If applicable, a reminder letter will be sent to the patient
regarding the next appointment.

BI-RADS CATEGORY  3: Probably benign.

## 2021-11-20 DIAGNOSIS — R002 Palpitations: Secondary | ICD-10-CM

## 2021-12-12 DIAGNOSIS — R110 Nausea: Secondary | ICD-10-CM | POA: Diagnosis not present

## 2021-12-13 ENCOUNTER — Encounter: Payer: Self-pay | Admitting: Cardiovascular Disease

## 2021-12-13 DIAGNOSIS — R002 Palpitations: Secondary | ICD-10-CM | POA: Diagnosis not present

## 2021-12-14 ENCOUNTER — Encounter: Payer: Self-pay | Admitting: Cardiovascular Disease

## 2021-12-14 MED ORDER — METOPROLOL SUCCINATE ER 50 MG PO TB24: 50.0000 mg | ORAL_TABLET | Freq: Every day | ORAL | 3 refills | Status: DC

## 2021-12-14 NOTE — Telephone Encounter (Signed)
I took a look at the monitor.  The symptoms appear to be associated with PVCs, which sometimes are quite frequent.  Also has occasional very brief bursts of atrial tachycardia, but these do not appear to be associated with symptoms.  These rhythm abnormalities is immediately dangerous, more of a nuisance.   ?The metoprolol 25 mg tablet that she takes helps with both of these conditions.  If her blood pressure allows, I would recommend taking 50 mg daily, until Dr. Gwenlyn Found can get back in touch. ?

## 2021-12-14 NOTE — Telephone Encounter (Signed)
Pt updated and verbalized understanding.  

## 2021-12-25 ENCOUNTER — Encounter: Payer: Self-pay | Admitting: Cardiovascular Disease

## 2021-12-25 DIAGNOSIS — H5212 Myopia, left eye: Secondary | ICD-10-CM | POA: Diagnosis not present

## 2021-12-25 DIAGNOSIS — H52221 Regular astigmatism, right eye: Secondary | ICD-10-CM | POA: Diagnosis not present

## 2021-12-25 DIAGNOSIS — H5201 Hypermetropia, right eye: Secondary | ICD-10-CM | POA: Diagnosis not present

## 2021-12-25 DIAGNOSIS — H26493 Other secondary cataract, bilateral: Secondary | ICD-10-CM | POA: Diagnosis not present

## 2021-12-26 NOTE — Telephone Encounter (Signed)
?  [name], MD  Raiford Simmonds, RN 40 minutes ago (3:59 PM)  ? ?Needs ROV to discuss results of Zio patch.  ? ?JJB   ? ?Spoke with pt. Office visit scheduled. Pt verbalizes understanding. ?

## 2021-12-27 ENCOUNTER — Encounter: Payer: Self-pay | Admitting: Cardiovascular Disease

## 2021-12-27 ENCOUNTER — Ambulatory Visit: Payer: Medicare HMO | Admitting: Cardiovascular Disease

## 2021-12-27 VITALS — BP 160/60 | HR 62 | Ht 61.0 in | Wt 110.8 lb

## 2021-12-27 DIAGNOSIS — R002 Palpitations: Secondary | ICD-10-CM | POA: Insufficient documentation

## 2021-12-27 DIAGNOSIS — I2510 Atherosclerotic heart disease of native coronary artery without angina pectoris: Secondary | ICD-10-CM

## 2021-12-27 DIAGNOSIS — E785 Hyperlipidemia, unspecified: Secondary | ICD-10-CM | POA: Diagnosis not present

## 2021-12-27 DIAGNOSIS — I340 Nonrheumatic mitral (valve) insufficiency: Secondary | ICD-10-CM

## 2021-12-27 DIAGNOSIS — R0989 Other specified symptoms and signs involving the circulatory and respiratory systems: Secondary | ICD-10-CM | POA: Diagnosis not present

## 2021-12-27 DIAGNOSIS — Z9861 Coronary angioplasty status: Secondary | ICD-10-CM

## 2021-12-27 NOTE — Progress Notes (Signed)
? ? ? ?12/27/2021 ?Rachel Valentine   ?1941-04-27  ?[id number] ? ?Primary Physician [name], MD ?Primary Cardiologist: [name] MD Lupe Carney, [place] ? ?HPI:  Rachel Valentine is a 81 y.o.  widowed Caucasian female mother of 2, grandmother and one grandchild referred to me initially by Dr. Hosie Poisson, her prior PCP.  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    I last saw her in the office 06/05/2021.  She was having palpitations and chest pain.  Her risk factors include hyperlipidemia and family history with a mother who had CABG.  She is never had a heart attack or stroke.  She was admitted to [facility] on 01/01/2023 unstable angina.  She ruled out for myocardial infarction.  She underwent cardiac catheterization by Dr. Ellyn Hack 01/04/2019 revealing normal LV function, high-grade mid AV groove circumflex on a band and high-grade diffuse calcified RCA stenosis with a fairly normal LAD.  She underwent LAD intervention by Dr. Ellyn Hack with staged diamondback orbital rotational atherectomy, PCI and drug-eluting stenting of the proximal mid and distal RCA by Dr. Angelena Form on 01/06/2019.  She was discharged home the following day.  She is felt well and denies chest pain.  She is on aspirin and Plavix as well as high-dose atorvastatin and a beta-blocker.  She is a fairly active exerciser and currently is back walking 2.5 to 4 miles a day 6 to 7 days a week without limitation. ?  ?She underwent outpatient radial diagnostic coronary angiography by myself 06/24/2019 revealing high-grade in-stent restenosis within in the previously placed RCA stents which I was unable to cross.  Based on this she underwent CABG x1 with a vein graft to the distal RCA by Dr. Cyndia Bent during the same hospitalization and was discharged home on 07/02/2019.  She is done well since.  She gets occasional early evening palpitations. ?  ?She has  been diagnosed with breast cancer and has finished her chemotherapy and radiation therapy.  She currently is on Herceptin and has 5 sessions left.   She had developed gross hematuria for unclear reasons being worked up by Dr. Jeffie Pollock at [facility] urology.  She is had symptomatic anemia requiring transfusion of 2 units of packed red blood cells.  Hematuria has resolved. ?  ?Since I saw her 6 months ago she has has noticed increasing symptomatic palpitations which began after her Prolia shot at the end of February.  Apparently this is one of the known side effects of this medication.  She did have a 2D echo performed 11/07/2021 that showed normal LV function with, with grade 2 diastolic dysfunction, and RV systolic pressure of 40 with moderate MR and moderate AI.  An event monitor showed frequent PVCs often in a prior trigeminal pattern with occasional couplets and triplets as well as episodes of SVT associated with the symptoms.  I did double her beta-blocker which resulted in some improvement in her symptoms. ? ? ?Current Meds  ?Medication Sig  ? anastrozole (ARIMIDEX) 1 MG tablet TAKE 1 TABLET BY MOUTH EVERY DAY  ? aspirin EC 81 MG tablet Take 81 mg by mouth daily. Swallow whole.  ? Biotin 10000 MCG TABS Take 10,000 mg by mouth daily.   ? Calcium-Phosphorus-Vitamin D (CITRACAL +D3 PO) Take 1 tablet by mouth in the morning and at bedtime.   ? denosumab (PROLIA) 60 MG/ML SOSY injection Inject 60 mg into the skin every 6 (six)  months.  ? ipratropium (ATROVENT) 0.03 % nasal spray Place 2 sprays into both nostrils daily.  ? metoprolol succinate (TOPROL-XL) 50 MG 24 hr tablet Take 1 tablet (50 mg total) by mouth daily.  ? nitroGLYCERIN (NITROSTAT) 0.4 MG SL tablet Place 1 tablet (0.4 mg total) under the tongue every 5 (five) minutes as needed for chest pain.  ? pantoprazole (PROTONIX) 40 MG tablet TAKE 1 TABLET BY MOUTH EVERY DAY  ? Propylene Glycol (SYSTANE BALANCE) 0.6 % SOLN Place 2 drops into both eyes daily as needed  (Dry eyes).   ? rosuvastatin (CRESTOR) 40 MG tablet Take 40 mg by mouth daily.   ?  ? ?Allergies  ?Allergen Reactions  ? Paclitaxel Rash  ? Fosamax [Alendronate] Nausea Only  ?  Unset stomach  ? Prednisone Other (See Comments)  ?  Blood pressure high when she takes it  ? Clindamycin/Lincomycin Rash  ? Penicillins Rash  ?  Did it involve swelling of the face/tongue/throat, SOB, or low BP? No ?Did it involve sudden or severe rash/hives, skin peeling, or any reaction on the inside of your mouth or nose? No ?Did you need to seek medical attention at a hospital or doctor's office? Yes ?When did it last happen?      Childhood ?If all above answers are ?NO?, may proceed with cephalosporin use.  ? ? ?Social History  ? ?Socioeconomic History  ? Marital status: Widowed  ?  Spouse name: Not on file  ? Number of children: Not on file  ? Years of education: Not on file  ? Highest education level: Not on file  ?Occupational History  ? Not on file  ?Tobacco Use  ? Smoking status: Former  ?  Types: Cigarettes  ?  Quit date: 01/06/1999  ?  Years since quitting: 22.9  ? Smokeless tobacco: Never  ?Vaping Use  ? Vaping Use: Never used  ?Substance and Sexual Activity  ? Alcohol use: No  ? Drug use: Never  ? Sexual activity: Not on file  ?Other Topics Concern  ? Not on file  ?Social History Narrative  ? Not on file  ? ?Social Determinants of Health  ? ?Financial Resource Strain: Not on file  ?Food Insecurity: Not on file  ?Transportation Needs: Not on file  ?Physical Activity: Not on file  ?Stress: Not on file  ?Social Connections: Not on file  ?Intimate Partner Violence: Not on file  ?  ? ?Review of Systems: ?General: negative for chills, fever, night sweats or weight changes.  ?Cardiovascular: negative for chest pain, dyspnea on exertion, edema, orthopnea, palpitations, paroxysmal nocturnal dyspnea or shortness of breath ?Dermatological: negative for rash ?Respiratory: negative for cough or wheezing ?Urologic: negative for  hematuria ?Abdominal: negative for nausea, vomiting, diarrhea, bright red blood per rectum, melena, or hematemesis ?Neurologic: negative for visual changes, syncope, or dizziness ?All other systems reviewed and are otherwise negative except as noted above. ? ? ? ?Blood pressure (!) 160/60, pulse 62, height '5\' 1"'$  (1.549 m), weight 110 lb 12.8 oz (50.3 kg), SpO2 95 %.  ?General appearance: alert and no distress ?Neck: no adenopathy, no carotid bruit, no JVD, supple, symmetrical, trachea midline, and thyroid not enlarged, symmetric, no tenderness/mass/nodules ?Lungs: clear to auscultation bilaterally ?Heart: regular rate and rhythm, S1, S2 normal, no murmur, click, rub or gallop ?Extremities: extremities normal, atraumatic, no cyanosis or edema ?Pulses: 2+ and symmetric ?Skin: Skin color, texture, turgor normal. No rashes or lesions ?Neurologic: Grossly normal ? ?EKG not performed today ? ?ASSESSMENT  AND PLAN:  ? ?CAD S/P percutaneous coronary angioplasty ?History of CAD status post AV groove circumflex stenting by Dr. Ellyn Hack 01/04/2019 with staged RCA orbital atherectomy, PCI and stenting by Dr. Angelena Form 01/06/2019.  I performed repeat catheterization on her 06/24/2019 revealing high-grade in-stent restenosis within the previously placed RCA stent which I was unable to cross.  Based on this she underwent CABG x1 with a vein graft to the distal RCA by Dr. Cyndia Bent during the same hospitalization and was discharged home 07/02/2019.  She has done well since.  She denies chest pain or shortness of breath. ? ?Dyslipidemia, goal LDL below 70 ?History of dyslipidemia on high-dose statin therapy with lipid profile performed 10/31/2020 revealing total cholesterol 141, LDL 53 and HDL 71. ? ?Mitral regurgitation ?Moderate mitral vegetation by 2D echo performed 11/07/2021 with normal LV size and function.  She also had moderate AI.  She is asymptomatic.  Her right ventricular systolic pressure is 40.  We will repeat her 2D echo on an  annual basis. ? ?Palpitations ?Long history of palpitations although fairly infrequent and asymptomatic until she had a Prolia shot at the end of February after which her palpitations became more noticeable and frequent oc

## 2021-12-27 NOTE — Assessment & Plan Note (Signed)
Long history of palpitations although fairly infrequent and asymptomatic until she had a Prolia shot at the end of February after which her palpitations became more noticeable and frequent occurring on a daily basis.  Apparently this is one of the "side effects" of Prolia.  She had an event monitor that showed frequent PVCs and by and trigeminal pattern with some couplets and triplets as well.  She also had runs of SVT which were associated with her symptoms.  I did double her beta-blocker which resulted in significant improvement.  We talked about referral to EP for further evaluation however we ultimately decided to pursue watchful wait waiting hoping that potentially the effects of Prolia will ultimately subside. ?

## 2021-12-27 NOTE — Assessment & Plan Note (Signed)
History of dyslipidemia on high-dose statin therapy with lipid profile performed 10/31/2020 revealing total cholesterol 141, LDL 53 and HDL 71. ?

## 2021-12-27 NOTE — Assessment & Plan Note (Signed)
History of CAD status post AV groove circumflex stenting by Dr. Ellyn Hack 01/04/2019 with staged RCA orbital atherectomy, PCI and stenting by Dr. Angelena Form 01/06/2019.  I performed repeat catheterization on her 06/24/2019 revealing high-grade in-stent restenosis within the previously placed RCA stent which I was unable to cross.  Based on this she underwent CABG x1 with a vein graft to the distal RCA by Dr. Cyndia Bent during the same hospitalization and was discharged home 07/02/2019.  She has done well since.  She denies chest pain or shortness of breath. ?

## 2021-12-27 NOTE — Patient Instructions (Addendum)
Medication Instructions:  ?Your physician recommends that you continue on your current medications as directed. Please refer to the Current Medication list given to you today. ?*If you need a refill on your cardiac medications before your next appointment, please call your pharmacy* ? ? ?Lab Work: ?None ?If you have labs (blood work) drawn today and your tests are completely normal, you will receive your results only by: ?MyChart Message (if you have MyChart) OR ?A paper copy in the mail ?If you have any lab test that is abnormal or we need to change your treatment, we will call you to review the results. ? ? ?Testing/Procedures: ?Your physician has requested that you have an echocardiogram. Echocardiography is a painless test that uses sound waves to create images of your heart. It provides your doctor with information about the size and shape of your heart and how well your heart?s chambers and valves are working. This procedure takes approximately one hour. There are no restrictions for this procedure.  - In 1 year ? ? ? ?Follow-Up: ?At [facility], you and your health needs are our priority.  As part of our continuing mission to provide you with exceptional heart care, we have created designated Provider Care Teams.  These Care Teams include your primary Cardiologist (physician) and Advanced Practice Providers (APPs -  Physician Assistants and Nurse Practitioners) who all work together to provide you with the care you need, when you need it. ? ?We recommend signing up for the patient portal called "MyChart".  Sign up information is provided on this After Visit Summary.  MyChart is used to connect with patients for Virtual Visits (Telemedicine).  Patients are able to view lab/test results, encounter notes, upcoming appointments, etc.  Non-urgent messages can be sent to your provider as well.   ?To learn more about what you can do with MyChart, go to [email or web address].   ? ?Your next appointment:   ?6  month(s) ? ?The format for your next appointment:   ?In Person ? ?Provider:   ?Quay Burow, MD   ? ? ?Other Instructions ? ? ?Important Information About Sugar ? ? ? ? ?  ?

## 2021-12-27 NOTE — Assessment & Plan Note (Signed)
Carotid Dopplers done 3 years ago prior to her bypass surgery were unremarkable. ?

## 2021-12-27 NOTE — Assessment & Plan Note (Signed)
Moderate mitral vegetation by 2D echo performed 11/07/2021 with normal LV size and function.  She also had moderate AI.  She is asymptomatic.  Her right ventricular systolic pressure is 40.  We will repeat her 2D echo on an annual basis. ?

## 2022-01-08 DIAGNOSIS — H26493 Other secondary cataract, bilateral: Secondary | ICD-10-CM | POA: Diagnosis not present

## 2022-01-08 DIAGNOSIS — Z961 Presence of intraocular lens: Secondary | ICD-10-CM | POA: Diagnosis not present

## 2022-01-08 DIAGNOSIS — H18413 Arcus senilis, bilateral: Secondary | ICD-10-CM | POA: Diagnosis not present

## 2022-01-08 DIAGNOSIS — H353131 Nonexudative age-related macular degeneration, bilateral, early dry stage: Secondary | ICD-10-CM | POA: Diagnosis not present

## 2022-01-21 DIAGNOSIS — I10 Essential (primary) hypertension: Secondary | ICD-10-CM | POA: Diagnosis not present

## 2022-01-21 DIAGNOSIS — M810 Age-related osteoporosis without current pathological fracture: Secondary | ICD-10-CM | POA: Diagnosis not present

## 2022-01-21 DIAGNOSIS — I2510 Atherosclerotic heart disease of native coronary artery without angina pectoris: Secondary | ICD-10-CM | POA: Diagnosis not present

## 2022-01-21 DIAGNOSIS — E7800 Pure hypercholesterolemia, unspecified: Secondary | ICD-10-CM | POA: Diagnosis not present

## 2022-01-21 DIAGNOSIS — K219 Gastro-esophageal reflux disease without esophagitis: Secondary | ICD-10-CM | POA: Diagnosis not present

## 2022-01-22 DIAGNOSIS — H26491 Other secondary cataract, right eye: Secondary | ICD-10-CM | POA: Diagnosis not present

## 2022-01-23 DIAGNOSIS — H1045 Other chronic allergic conjunctivitis: Secondary | ICD-10-CM | POA: Diagnosis not present

## 2022-01-23 DIAGNOSIS — J3089 Other allergic rhinitis: Secondary | ICD-10-CM | POA: Diagnosis not present

## 2022-01-23 DIAGNOSIS — J301 Allergic rhinitis due to pollen: Secondary | ICD-10-CM | POA: Diagnosis not present

## 2022-01-23 DIAGNOSIS — J3081 Allergic rhinitis due to animal (cat) (dog) hair and dander: Secondary | ICD-10-CM | POA: Diagnosis not present

## 2022-01-29 DIAGNOSIS — H26492 Other secondary cataract, left eye: Secondary | ICD-10-CM | POA: Diagnosis not present

## 2022-02-14 DIAGNOSIS — H26492 Other secondary cataract, left eye: Secondary | ICD-10-CM | POA: Diagnosis not present

## 2022-02-19 ENCOUNTER — Telehealth: Payer: Self-pay | Admitting: Emergency Medicine

## 2022-02-19 NOTE — Telephone Encounter (Signed)
S1714 - A Prospective Observational Cohort Study to Develop a Predictive Model of Taxane-Induced Peripheral Neuropathy in Cancer Patients  02/19/22 - Week 104  Called to conduct week 104 visit over phone.  Patient reports tingling, numbness, and occasionally pain in her bilateral toes, left more than right.  Patient reports the symptoms come and go, and are about the same or maybe slightly improved since last year.  Overall she reports her symptoms have improved since immediately after chemotherapy completion.  She reports that her symptoms do not affect her daily activities.    The following PROs were completed verbally over the phone today: EORTC QLQ-CIPN20, FACT/GOG-NTX-4, and PRO-CTCAE.    She as advised that the next assessment for this study would take place in approximately 1 year.  The patient verbalized understanding and denied questions at this time.  She was thanked for her ongoing participation in this study.  Clabe Seal Clinical Research Coordinator I  02/19/22  3:23 PM

## 2022-02-22 DIAGNOSIS — H26492 Other secondary cataract, left eye: Secondary | ICD-10-CM | POA: Diagnosis not present

## 2022-02-28 ENCOUNTER — Other Ambulatory Visit: Payer: Self-pay | Admitting: Cardiovascular Disease

## 2022-02-28 DIAGNOSIS — K2100 Gastro-esophageal reflux disease with esophagitis, without bleeding: Secondary | ICD-10-CM

## 2022-03-04 ENCOUNTER — Encounter: Payer: Self-pay | Admitting: Cardiovascular Disease

## 2022-03-06 NOTE — Telephone Encounter (Signed)
Spoke to patient appointment scheduled with Laurann Montana NP 7/10 at 10:55 am at [facility] location.Directions given.Advised to take Metoprolol Succ 50 mg daily.Advised to bring a list of pulse and B/P readings to appointment.Also bring a list of all medications.

## 2022-03-11 ENCOUNTER — Encounter (HOSPITAL_BASED_OUTPATIENT_CLINIC_OR_DEPARTMENT_OTHER): Payer: Self-pay | Admitting: Family

## 2022-03-11 ENCOUNTER — Ambulatory Visit (HOSPITAL_BASED_OUTPATIENT_CLINIC_OR_DEPARTMENT_OTHER): Payer: Medicare HMO | Admitting: Family

## 2022-03-11 VITALS — BP 132/60 | HR 54 | Ht 61.0 in | Wt 109.0 lb

## 2022-03-11 DIAGNOSIS — R001 Bradycardia, unspecified: Secondary | ICD-10-CM | POA: Diagnosis not present

## 2022-03-11 DIAGNOSIS — I25118 Atherosclerotic heart disease of native coronary artery with other forms of angina pectoris: Secondary | ICD-10-CM

## 2022-03-11 DIAGNOSIS — E785 Hyperlipidemia, unspecified: Secondary | ICD-10-CM | POA: Diagnosis not present

## 2022-03-11 DIAGNOSIS — R002 Palpitations: Secondary | ICD-10-CM

## 2022-03-11 DIAGNOSIS — Z951 Presence of aortocoronary bypass graft: Secondary | ICD-10-CM | POA: Diagnosis not present

## 2022-03-11 DIAGNOSIS — R42 Dizziness and giddiness: Secondary | ICD-10-CM

## 2022-03-11 MED ORDER — METOPROLOL SUCCINATE ER 25 MG PO TB24: 25.0000 mg | ORAL_TABLET | Freq: Two times a day (BID) | ORAL | 2 refills | Status: DC

## 2022-03-11 NOTE — Patient Instructions (Addendum)
Medication Instructions:  Your physician has recommended you make the following change in your medication:   CHANGE Metoprolol Succinate '25mg'$  twice daily *try to take your doses 12 hours apart   *If you need a refill on your cardiac medications before your next appointment, please call your pharmacy*  Lab Work: Your physician recommends that you return for lab work today CMP, CBC, TSH, magnesium, lipid panel, direct LDL  If you have labs (blood work) drawn today and your tests are completely normal, you will receive your results only by: [place] (if you have MyChart) OR A paper copy in the mail If you have any lab test that is abnormal or we need to change your treatment, we will call you to review the results.  Testing/Procedures: None ordered today.   Follow-Up: At [facility], you and your health needs are our priority.  As part of our continuing mission to provide you with exceptional heart care, we have created designated Provider Care Teams.  These Care Teams include your primary Cardiologist (physician) and Advanced Practice Providers (APPs -  Physician Assistants and Nurse Practitioners) who all work together to provide you with the care you need, when you need it.  We recommend signing up for the patient portal called "MyChart".  Sign up information is provided on this After Visit Summary.  MyChart is used to connect with patients for Virtual Visits (Telemedicine).  Patients are able to view lab/test results, encounter notes, upcoming appointments, etc.  Non-urgent messages can be sent to your provider as well.   To learn more about what you can do with MyChart, go to [email or web address].    Your next appointment:   As scheduled with Dr. Gwenlyn Found   If you want to see the electrophysiologist, please just let us know  Other Instructions  To prevent palpitations: Make sure you are adequately hydrated.  Avoid and/or limit caffeine containing beverages like soda or  tea. Exercise regularly.  Manage stress well. Some over the counter medications can cause palpitations such as Benadryl, AdvilPM, TylenolPM. Regular Advil or Tylenol do not cause palpitations.

## 2022-03-11 NOTE — Progress Notes (Signed)
Office Visit    Patient Name: Rachel Valentine [facility] Date of Encounter: 03/11/2022  PCP:  [name], [facility]  Cardiologist:  Quay Burow, MD  Advanced Practice Provider:  No care team member to display Electrophysiologist:  None      Chief Complaint    Rachel Valentine is a 81 y.o. female presents today for palpitations   Past Medical History    Past Medical History:  Diagnosis Date   Anemia    during chemo   Bowel obstruction ([place])    Cancer ([place])    dx of breast cancer 2021 - per pt in PAT 02/09/20   Chronic kidney disease    blood clot in right kidney after surgery in June, 2021   Coronary artery disease    GERD (gastroesophageal reflux disease)    Hypertension    Personal history of chemotherapy    Personal history of radiation therapy    Pneumonia    PONV (postoperative nausea and vomiting)    Past Surgical History:  Procedure Laterality Date   ABDOMINAL HYSTERECTOMY     ABDOMINAL SURGERY     APPENDECTOMY     AUGMENTATION MAMMAPLASTY Bilateral    implants removed in 2005   BREAST EXCISIONAL BIOPSY Left    benign   BREAST LUMPECTOMY     BREAST LUMPECTOMY WITH RADIOACTIVE SEED AND SENTINEL LYMPH NODE BIOPSY Left 06/15/2020   Procedure: LEFT BREAST LUMPECTOMYX 2 WITH RADIOACTIVE SEED AND SENTINEL LYMPH NODE BIOPSY;  Surgeon: [name], MD;  Location: [place];  Service: General;  Laterality: Left;  PECTORAL BLOCK   CORONARY ARTERY BYPASS GRAFT N/A 06/28/2019   Procedure: CORONARY ARTERY BYPASS GRAFTING (CABG) using endoscopic harvest right saphenous vein to the posterior lateral (PLB).;  Surgeon: Gaye Pollack, MD;  Location: [place];  Service: Open Heart Surgery;  Laterality: N/A;   CORONARY ATHERECTOMY N/A 01/05/2019   Procedure: CORONARY ATHERECTOMY - STENT;  Surgeon: Burnell Blanks, MD;  Location: [place] [facility];  Service: Cardiovascular;  Laterality: N/A;   CORONARY STENT  INTERVENTION N/A 01/04/2019   Procedure: CORONARY STENT INTERVENTION;  Surgeon: Leonie Man, MD;  Location: [place] [facility];  Service: Cardiovascular;  Laterality: N/A;   LEFT HEART CATH AND CORONARY ANGIOGRAPHY N/A 01/04/2019   Procedure: LEFT HEART CATH AND CORONARY ANGIOGRAPHY;  Surgeon: Leonie Man, MD;  Location: [place] [facility];  Service: Cardiovascular;  Laterality: N/A;   LEFT HEART CATH AND CORONARY ANGIOGRAPHY N/A 06/24/2019   Procedure: LEFT HEART CATH AND CORONARY ANGIOGRAPHY;  Surgeon: [name], MD;  Location: [place] [facility];  Service: Cardiovascular;  Laterality: N/A;   PORT-A-CATH REMOVAL Right 02/02/2021   Procedure: PORT REMOVAL;  Surgeon: [name], MD;  Location: [place];  Service: General;  Laterality: Right;   PORTACATH PLACEMENT Right 02/15/2020   Procedure: INSERTION PORT-A-CATH WITH ULTRASOUND GUIDANCE;  Surgeon: [name], MD;  Location: [place];  Service: General;  Laterality: Right;   TEE WITHOUT CARDIOVERSION N/A 06/28/2019   Procedure: TRANSESOPHAGEAL ECHOCARDIOGRAM (TEE);  Surgeon: Gaye Pollack, MD;  Location: [place];  Service: Open Heart Surgery;  Laterality: N/A;   TEMPORARY PACEMAKER N/A 01/05/2019   Procedure: TEMPORARY PACEMAKER;  Surgeon: Burnell Blanks, MD;  Location: [place] [facility];  Service: Cardiovascular;  Laterality: N/A;   TONSILLECTOMY      Allergies  Allergies  Allergen Reactions   Paclitaxel Rash   Fosamax [  Alendronate] Nausea Only    Unset stomach   Prednisone Other (See Comments)    Blood pressure high when she takes it   Clindamycin/Lincomycin Rash   Penicillins Rash    Did it involve swelling of the face/tongue/throat, SOB, or low BP? No Did it involve sudden or severe rash/hives, skin peeling, or any reaction on the inside of your mouth or nose? No Did you need to seek medical attention at a hospital or doctor's office? Yes When did it last happen?       Childhood If all above answers are "NO", may proceed with cephalosporin use.    History of Present Illness    Rachel Valentine is a 81 y.o. female with a hx of CAD ( prior atherectomy LAD, 01/2019 DES to RCA, 06/2019 s/p CABG x1 SVG-distal RCA), breast cancer s/p chemo and radiation, HLD, anemia, bilateral carotid bruit (duplex 2020 unremarkable), palpitations last seen 12/27/21 by Dr. Gwenlyn Found.  She did have palpitations after starting Prolia which is documented side effect. Echo 11/07/21 normal LVEF, gr1DD, moderate MR and AI. Event monitor with frequent PVC, often in trigeminal pattern wit occasional couplet and triplet, episodes of SVT associated with symptoms. Her Metoprolol was increased with improvement.   She contacted the office noting palpitations and periodic hypotension and recommended for follow up.   She presents today independently. She is celebrating at her 81th birthday this weekend and plans to go zip lining at [facility] park.  Tells me current lightheadedness feels different than her previous vertigo. Takes her Metoprolol in the morning. She has an upper arm BP cuff at home that she purchased one year ago. She does sit and rest 5-10 minutes. Tells me she feels as if "my heart is working harder" and will feel a hard heart beat or up in her throat. Eats small meals throughout the day. Drinks 1-2 bottles of water throughout the day. Does not drink soda. One cup of decaf in the morning. No recent stressors.   EKGs/Labs/Other Studies Reviewed:   The following studies were reviewed today:  Echo 10/2021   1. Left ventricular ejection fraction, by estimation, is 60 to 65%. The  left ventricle has normal function. The left ventricle has no regional  wall motion abnormalities. Left ventricular diastolic parameters are  consistent with Grade II diastolic  dysfunction (pseudonormalization). Elevated left atrial pressure.   2. Right ventricular systolic function is normal. The right  ventricular  size is normal. There is mildly elevated pulmonary artery systolic  pressure. The estimated right ventricular systolic pressure is 79.8 mmHg.   3. Left atrial size was mildly dilated.   4. The mitral valve is degenerative. Moderate mitral valve regurgitation.  Moderate mitral annular calcification.   5. Tricuspid valve regurgitation is moderate.   6. The aortic valve is tricuspid. There is moderate calcification of the  aortic valve. There is moderate thickening of the aortic valve. Aortic  valve regurgitation is mild to moderate. Aortic valve  sclerosis/calcification is present, without any  evidence of aortic stenosis.   7. The inferior vena cava is normal in size with greater than 50%  respiratory variability, suggesting right atrial pressure of 3 mmHg.   Comparison(s): No significant change from prior study. Prior images  reviewed side by side.   Monitor 12/2021 Patient had a min HR of 51 bpm, max HR of 169 bpm, and avg HR of 71 bpm. Predominant underlying rhythm was Sinus Rhythm. 19 Supraventricular Tachycardia runs occurred, the run with the fastest  interval lasting 11.2 secs with a max rate of 169 bpm, the  longest lasting 13.9 secs with an avg rate of 120 bpm. Supraventricular Tachycardia was detected within +/- 45 seconds of symptomatic patient event(s). Isolated SVEs were rare (<1.0%), SVE Couplets were rare (<1.0%), and SVE Triplets were rare (<1.0%).  Isolated VEs were occasional (1.7%, 24369), VE Couplets were rare (<1.0%, 2397), and VE Triplets were rare (<1.0%, 21). Ventricular Trigeminy was present.    1. SR/SB/ST 2. Freq PVCs , Bi/Trigem, couplets and triplets 3. Runs of SVT assoc with Sx 4. Needs ROV to discuss  EKG:  No EKG today.  Recent Labs: 10/22/2021: ALT 8; BUN 20; Creatinine 0.75; Hemoglobin 12.8; Platelet Count 246; Potassium 4.8; Sodium 139  Recent Lipid Panel    Component Value Date/Time   CHOL 141 10/31/2020 1032   TRIG 95 10/31/2020 1032    HDL 71 10/31/2020 1032   CHOLHDL 2.0 10/31/2020 1032   CHOLHDL 2.1 06/27/2019 0221   VLDL 14 06/27/2019 0221   LDLCALC 53 10/31/2020 1032   Home Medications   Current Meds  Medication Sig   anastrozole (ARIMIDEX) 1 MG tablet TAKE 1 TABLET BY MOUTH EVERY DAY   aspirin EC 81 MG tablet Take 81 mg by mouth daily. Swallow whole.   Biotin 10000 MCG TABS Take 10,000 mg by mouth daily.    Calcium-Phosphorus-Vitamin D (CITRACAL +D3 PO) Take 1 tablet by mouth in the morning and at bedtime.    denosumab (PROLIA) 60 MG/ML SOSY injection Inject 60 mg into the skin every 6 (six) months.   ipratropium (ATROVENT) 0.03 % nasal spray Place 2 sprays into both nostrils daily.   metoprolol succinate (TOPROL XL) 25 MG 24 hr tablet Take 1 tablet (25 mg total) by mouth in the morning and at bedtime.   nitroGLYCERIN (NITROSTAT) 0.4 MG SL tablet Place 1 tablet (0.4 mg total) under the tongue every 5 (five) minutes as needed for chest pain.   pantoprazole (PROTONIX) 40 MG tablet TAKE 1 TABLET BY MOUTH EVERY DAY   Polyvinyl Alcohol-Povidone PF (REFRESH) 1.4-0.6 % SOLN Apply 1 drop to eye 3 (three) times daily as needed.   rosuvastatin (CRESTOR) 40 MG tablet Take 40 mg by mouth daily.    [DISCONTINUED] metoprolol succinate (TOPROL-XL) 50 MG 24 hr tablet Take 1 tablet (50 mg total) by mouth daily.     Review of Systems      All other systems reviewed and are otherwise negative except as noted above.  Physical Exam    VS:  BP 132/60   Pulse (!) 54   Ht '5\' 1"'$  (1.549 m)   Wt 109 lb (49.4 kg)   LMP  (LMP Unknown)   BMI 20.60 kg/m  , BMI Body mass index is 20.6 kg/m.  Wt Readings from Last 3 Encounters:  03/11/22 109 lb (49.4 kg)  12/27/21 110 lb 12.8 oz (50.3 kg)  09/24/21 111 lb 14.4 oz (50.8 kg)    GEN: Well nourished, well developed, in no acute distress. HEENT: normal. Neck: Supple, no JVD, carotid bruits, or masses. Cardiac: RRR, bradycardia, no murmurs, rubs, or gallops. No clubbing, cyanosis,  edema.  Radials/PT 2+ and equal bilaterally.  Respiratory:  Respirations regular and unlabored, clear to auscultation bilaterally. GI: Soft, nontender, nondistended. MS: No deformity or atrophy. Skin: Warm and dry, no rash. Neuro:  Strength and sensation are intact. Psych: Normal affect.  Assessment & Plan    Palpitations / Lightheadedness - Prior monitor frequent PVC in trigeminal pattern,  couplets and triplets. Also runs of symptomatic SVT.  1 week history of palpitations. CMP, mag, CBC, TSH today. Encouraged to increase hydration as dehydration likely contributory. Offered repeat ZIO and referral to EP. She prefers to proceed with medication changes. Change Toprol '50mg'$  QD to '25mg'$  BID as hopeful to reduce some of the hypotension she is experiencing at home with split dosing.   Orthostatic VS for the past 24 hrs (Last 3 readings):  BP- Lying Pulse- Lying BP- Sitting Pulse- Sitting BP- Standing at 0 minutes Pulse- Standing at 0 minutes BP- Standing at 3 minutes Pulse- Standing at 3 minutes  03/11/22 1038 146/55 52 154/53 53 135/54 55 130/62 54   CAD s/p CABG - AV groove Cx stenting by Dr. Ellyn Hack 01/04/19 with staged RCA orbital therectomy, PCI and stenting yb Dr. Angelena Form 01/06/19. Repeat LHC 06/2019 with ISR of RCA with 06/2019 CABG SVG-RCA. Stable with no anginal symptoms. No indication for ischemic evaluation.  GDMT asa, metoprolol, rosuvastatin, prn nitroglycerin. Heart healthy diet and regular cardiovascular exercise encouraged.    HLD, LDL goal <70 - 10/2020 LDL 53. Due for repeat lipid panel, collected today.   AI/MR - 10/2021 moderate MR, moderate AI. Consider repeat echo in 1 year. Continue optimal BP control.   Disposition: Follow up  in October as scheduled  with Quay Burow, MD   Signed, Loel Dubonnet, NP 03/11/2022, 12:37 PM [place]

## 2022-03-12 LAB — COMPREHENSIVE METABOLIC PANEL
ALT: 10 IU/L (ref 0–32)
AST: 27 IU/L (ref 0–40)
Albumin/Globulin Ratio: 2 (ref 1.2–2.2)
Albumin: 4.9 g/dL — ABNORMAL HIGH (ref 3.8–4.8)
Alkaline Phosphatase: 81 IU/L (ref 44–121)
BUN/Creatinine Ratio: 19 (ref 12–28)
BUN: 17 mg/dL (ref 8–27)
Bilirubin Total: 0.4 mg/dL (ref 0.0–1.2)
CO2: 20 mmol/L (ref 20–29)
Calcium: 9.9 mg/dL (ref 8.7–10.3)
Chloride: 102 mmol/L (ref 96–106)
Creatinine, Ser: 0.9 mg/dL (ref 0.57–1.00)
Globulin, Total: 2.5 g/dL (ref 1.5–4.5)
Glucose: 102 mg/dL — ABNORMAL HIGH (ref 70–99)
Potassium: 5.2 mmol/L (ref 3.5–5.2)
Sodium: 140 mmol/L (ref 134–144)
Total Protein: 7.4 g/dL (ref 6.0–8.5)
eGFR: 65 mL/min/{1.73_m2} (ref >59)

## 2022-03-12 LAB — CBC
Hematocrit: 41.7 % (ref 34.0–46.6)
Hemoglobin: 14 g/dL (ref 11.1–15.9)
MCH: 31.1 pg (ref 26.6–33.0)
MCHC: 33.6 g/dL (ref 31.5–35.7)
MCV: 93 fL (ref 79–97)
Platelets: 273 10*3/uL (ref 150–450)
RBC: 4.5 x10E6/uL (ref 3.77–5.28)
RDW: 12.9 % (ref 11.7–15.4)
WBC: 8.8 10*3/uL (ref 3.4–10.8)

## 2022-03-12 LAB — MAGNESIUM: Magnesium: 2.4 mg/dL — ABNORMAL HIGH (ref 1.6–2.3)

## 2022-03-12 LAB — TSH: TSH: 1.89 u[IU]/mL (ref 0.450–4.500)

## 2022-03-12 LAB — LIPID PANEL
Chol/HDL Ratio: 2.2 ratio (ref 0.0–4.4)
Cholesterol, Total: 145 mg/dL (ref 100–199)
HDL: 67 mg/dL (ref >39)
LDL Chol Calc (NIH): 59 mg/dL (ref 0–99)
Triglycerides: 107 mg/dL (ref 0–149)
VLDL Cholesterol Cal: 19 mg/dL (ref 5–40)

## 2022-03-12 LAB — LDL CHOLESTEROL, DIRECT: LDL Direct: 46 mg/dL (ref 0–99)

## 2022-04-05 DIAGNOSIS — Z23 Encounter for immunization: Secondary | ICD-10-CM | POA: Diagnosis not present

## 2022-04-05 DIAGNOSIS — E440 Moderate protein-calorie malnutrition: Secondary | ICD-10-CM | POA: Diagnosis not present

## 2022-04-05 DIAGNOSIS — R490 Dysphonia: Secondary | ICD-10-CM | POA: Diagnosis not present

## 2022-04-05 DIAGNOSIS — J302 Other seasonal allergic rhinitis: Secondary | ICD-10-CM | POA: Diagnosis not present

## 2022-04-05 DIAGNOSIS — I700 Atherosclerosis of aorta: Secondary | ICD-10-CM | POA: Diagnosis not present

## 2022-04-05 DIAGNOSIS — R7301 Impaired fasting glucose: Secondary | ICD-10-CM | POA: Diagnosis not present

## 2022-04-05 DIAGNOSIS — C50412 Malignant neoplasm of upper-outer quadrant of left female breast: Secondary | ICD-10-CM | POA: Diagnosis not present

## 2022-04-05 DIAGNOSIS — E7800 Pure hypercholesterolemia, unspecified: Secondary | ICD-10-CM | POA: Diagnosis not present

## 2022-04-05 DIAGNOSIS — I10 Essential (primary) hypertension: Secondary | ICD-10-CM | POA: Diagnosis not present

## 2022-04-05 DIAGNOSIS — I2510 Atherosclerotic heart disease of native coronary artery without angina pectoris: Secondary | ICD-10-CM | POA: Diagnosis not present

## 2022-04-05 DIAGNOSIS — Z0000 Encounter for general adult medical examination without abnormal findings: Secondary | ICD-10-CM | POA: Diagnosis not present

## 2022-04-05 DIAGNOSIS — M810 Age-related osteoporosis without current pathological fracture: Secondary | ICD-10-CM | POA: Diagnosis not present

## 2022-04-10 DIAGNOSIS — J383 Other diseases of vocal cords: Secondary | ICD-10-CM | POA: Diagnosis not present

## 2022-04-10 DIAGNOSIS — K219 Gastro-esophageal reflux disease without esophagitis: Secondary | ICD-10-CM | POA: Diagnosis not present

## 2022-04-10 DIAGNOSIS — R0989 Other specified symptoms and signs involving the circulatory and respiratory systems: Secondary | ICD-10-CM | POA: Diagnosis not present

## 2022-04-12 ENCOUNTER — Inpatient Hospital Stay: Payer: Medicare HMO

## 2022-04-15 DIAGNOSIS — M810 Age-related osteoporosis without current pathological fracture: Secondary | ICD-10-CM | POA: Diagnosis not present

## 2022-04-15 DIAGNOSIS — I10 Essential (primary) hypertension: Secondary | ICD-10-CM | POA: Diagnosis not present

## 2022-04-15 DIAGNOSIS — E7800 Pure hypercholesterolemia, unspecified: Secondary | ICD-10-CM | POA: Diagnosis not present

## 2022-05-23 ENCOUNTER — Ambulatory Visit
Admission: RE | Admit: 2022-05-23 | Discharge: 2022-05-23 | Disposition: A | Discharge status: Home / Self Care | Payer: Medicare HMO | Source: Ambulatory Visit | Attending: Adult Health | Admitting: Adult Health

## 2022-05-23 ENCOUNTER — Other Ambulatory Visit: Payer: Self-pay | Admitting: Adult Health

## 2022-05-23 DIAGNOSIS — N6320 Unspecified lump in the left breast, unspecified quadrant: Secondary | ICD-10-CM

## 2022-05-23 DIAGNOSIS — R921 Mammographic calcification found on diagnostic imaging of breast: Secondary | ICD-10-CM

## 2022-05-23 DIAGNOSIS — N6321 Unspecified lump in the left breast, upper outer quadrant: Secondary | ICD-10-CM | POA: Diagnosis not present

## 2022-06-04 ENCOUNTER — Encounter: Payer: Self-pay | Admitting: Cardiovascular Disease

## 2022-06-04 ENCOUNTER — Ambulatory Visit: Payer: Medicare HMO | Attending: Cardiovascular Disease | Admitting: Cardiovascular Disease

## 2022-06-04 VITALS — BP 122/56 | HR 64 | Ht 62.0 in | Wt 110.0 lb

## 2022-06-04 DIAGNOSIS — I2510 Atherosclerotic heart disease of native coronary artery without angina pectoris: Secondary | ICD-10-CM

## 2022-06-04 DIAGNOSIS — R002 Palpitations: Secondary | ICD-10-CM | POA: Diagnosis not present

## 2022-06-04 DIAGNOSIS — E785 Hyperlipidemia, unspecified: Secondary | ICD-10-CM

## 2022-06-04 DIAGNOSIS — Z9861 Coronary angioplasty status: Secondary | ICD-10-CM | POA: Diagnosis not present

## 2022-06-04 DIAGNOSIS — I340 Nonrheumatic mitral (valve) insufficiency: Secondary | ICD-10-CM | POA: Diagnosis not present

## 2022-06-04 DIAGNOSIS — I25118 Atherosclerotic heart disease of native coronary artery with other forms of angina pectoris: Secondary | ICD-10-CM

## 2022-06-04 NOTE — Patient Instructions (Signed)
  Follow-Up: At [facility], you and your health needs are our priority.  As part of our continuing mission to provide you with exceptional heart care, we have created designated Provider Care Teams.  These Care Teams include your primary Cardiologist (physician) and Advanced Practice Providers (APPs -  Physician Assistants and Nurse Practitioners) who all work together to provide you with the care you need, when you need it.  We recommend signing up for the patient portal called "MyChart".  Sign up information is provided on this After Visit Summary.  MyChart is used to connect with patients for Virtual Visits (Telemedicine).  Patients are able to view lab/test results, encounter notes, upcoming appointments, etc.  Non-urgent messages can be sent to your provider as well.   To learn more about what you can do with MyChart, go to [email or web address].    Your next appointment:   12 month(s)  The format for your next appointment:   In Person  Provider:   Jonathan Berry, MD            

## 2022-06-04 NOTE — Assessment & Plan Note (Signed)
History of CAD status post chronic catheterization by Dr. Ellyn Hack 5//20.  This revealed normal LV function with high-grade mid AV groove circumflex disease and diffuse calcified RCA disease with fairly normal LAD.  She underwent LAD intervention by Dr. Ellyn Hack with staged orbital atherectomy, PCI and drug-eluting stenting of the distal RCA by Dr. Angelena Form 01/06/2019.  I performed diagnostic cath on her 06/24/2019 revealing high-grade in-stent restenosis within the previously placed RCA stents which I was unable to cross.  She ultimately underwent CABG x1 with a vein to the distal RCA by Dr. Cyndia Bent and was discharged home 07/02/2019.  She has been asymptomatic since.

## 2022-06-04 NOTE — Progress Notes (Signed)
06/04/2022 Rachel Valentine   01/22/1941  [id number]  Primary Physician [name], MD Primary Cardiologist: [name] MD Garret [place], [place], [place]  HPI:  Rachel Valentine is a 81 y.o.  widowed Caucasian female mother of 2, grandmother and one grandchild referred to me initially by Dr. Hosie Poisson, her prior PCP.  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    I last saw her in the office 12/27/2021.  She was having palpitations and chest pain.  Her risk factors include hyperlipidemia and family history with a mother who had CABG.  She is never had a heart attack or stroke.  She was admitted to [facility] on 01/01/2023 unstable angina.  She ruled out for myocardial infarction.  She underwent cardiac catheterization by Dr. Ellyn Hack 01/04/2019 revealing normal LV function, high-grade mid AV groove circumflex on a band and high-grade diffuse calcified RCA stenosis with a fairly normal LAD.  She underwent LAD intervention by Dr. Ellyn Hack with staged diamondback orbital rotational atherectomy, PCI and drug-eluting stenting of the proximal mid and distal RCA by Dr. Angelena Form on 01/06/2019.  She was discharged home the following day.  She is felt well and denies chest pain.  She is on aspirin and Plavix as well as high-dose atorvastatin and a beta-blocker.  She is a fairly active exerciser and currently is back walking 2.5 to 4 miles a day 6 to 7 days a week without limitation.   She underwent outpatient radial diagnostic coronary angiography by myself 06/24/2019 revealing high-grade in-stent restenosis within in the previously placed RCA stents which I was unable to cross.  Based on this she underwent CABG x1 with a vein graft to the distal RCA by Dr. Cyndia Bent during the same hospitalization and was discharged home on 07/02/2019.  She is done well since.  She gets occasional early evening palpitations.   She has  been diagnosed with breast cancer and has finished her chemotherapy and radiation therapy.  She currently is on Herceptin and has 5 sessions left.   She had developed gross hematuria for unclear reasons being worked up by Dr. Jeffie Pollock at [facility].  She is had symptomatic anemia requiring transfusion of 2 units of packed red blood cells.  Hematuria has resolved.   She had noticed increasing symptomatic palpitations which began after her Prolia shot at the end of February.  Apparently this is one of the known side effects of this medication.  She did have a 2D echo performed 11/07/2021 that showed normal LV function with, with grade 2 diastolic dysfunction, and RV systolic pressure of 40 with moderate MR and moderate AI.  An event monitor showed frequent PVCs often in a prior trigeminal pattern with occasional couplets and triplets as well as episodes of SVT associated with the symptoms.  I did double her beta-blocker which resulted in some improvement in her symptoms.  Since I saw her 6 months ago she did see Laurann Montana in the office 03/11/2022 again complaining of increased palpitations.  Her beta-blocker was changed to twice daily.  Over the last several months her palpitations have markedly improved.  She is very active walking 2 miles a day is otherwise asymptomatic.   Current Meds  Medication Sig   anastrozole (ARIMIDEX) 1 MG tablet TAKE 1 TABLET BY MOUTH EVERY DAY   aspirin EC 81 MG tablet Take 81 mg by mouth daily. Swallow whole.   Biotin  10000 MCG TABS Take 10,000 mg by mouth daily.    Calcium-Phosphorus-Vitamin D (CITRACAL +D3 PO) Take 1 tablet by mouth in the morning and at bedtime.    ipratropium (ATROVENT) 0.03 % nasal spray Place 2 sprays into both nostrils daily.   metoprolol succinate (TOPROL XL) 25 MG 24 hr tablet Take 1 tablet (25 mg total) by mouth in the morning and at bedtime.   nitroGLYCERIN (NITROSTAT) 0.4 MG SL tablet Place 1 tablet (0.4 mg total) under the tongue every 5  (five) minutes as needed for chest pain.   pantoprazole (PROTONIX) 40 MG tablet TAKE 1 TABLET BY MOUTH EVERY DAY   Polyvinyl Alcohol-Povidone PF (REFRESH) 1.4-0.6 % SOLN Apply 1 drop to eye 3 (three) times daily as needed.   rosuvastatin (CRESTOR) 40 MG tablet Take 40 mg by mouth daily.      Allergies  Allergen Reactions   Paclitaxel Rash   Fosamax [Alendronate] Nausea Only    Unset stomach   Prednisone Other (See Comments)    Blood pressure high when she takes it   Clindamycin/Lincomycin Rash   Penicillins Rash    Did it involve swelling of the face/tongue/throat, SOB, or low BP? No Did it involve sudden or severe rash/hives, skin peeling, or any reaction on the inside of your mouth or nose? No Did you need to seek medical attention at a hospital or doctor's office? Yes When did it last happen?      Childhood If all above answers are "NO", may proceed with cephalosporin use.    Social History   Socioeconomic History   Marital status: Widowed    Spouse name: Not on file   Number of children: Not on file   Years of education: Not on file   Highest education level: Not on file  Occupational History   Not on file  Tobacco Use   Smoking status: Former    Types: Cigarettes    Quit date: 01/06/1999    Years since quitting: 23.4   Smokeless tobacco: Never  Vaping Use   Vaping Use: Never used  Substance and Sexual Activity   Alcohol use: No   Drug use: Never   Sexual activity: Not on file  Other Topics Concern   Not on file  Social History Narrative   Not on file   Social Determinants of Health   Financial Resource Strain: Not on file  Food Insecurity: Not on file  Transportation Needs: Not on file  Physical Activity: Not on file  Stress: Not on file  Social Connections: Not on file  Intimate Partner Violence: Not on file     Review of Systems: General: negative for chills, fever, night sweats or weight changes.  Cardiovascular: negative for chest pain, dyspnea on  exertion, edema, orthopnea, palpitations, paroxysmal nocturnal dyspnea or shortness of breath Dermatological: negative for rash Respiratory: negative for cough or wheezing Urologic: negative for hematuria Abdominal: negative for nausea, vomiting, diarrhea, bright red blood per rectum, melena, or hematemesis Neurologic: negative for visual changes, syncope, or dizziness All other systems reviewed and are otherwise negative except as noted above.    Blood pressure (!) 122/56, pulse 64, height '5\' 2"'$  (1.575 m), weight 110 lb (49.9 kg), SpO2 93 %.  General appearance: alert and no distress Neck: no adenopathy, no carotid bruit, no JVD, supple, symmetrical, trachea midline, and thyroid not enlarged, symmetric, no tenderness/mass/nodules Lungs: clear to auscultation bilaterally Heart: regular rate and rhythm, S1, S2 normal, no murmur, click, rub or gallop Extremities:  extremities normal, atraumatic, no cyanosis or edema Pulses: 2+ and symmetric Skin: Skin color, texture, turgor normal. No rashes or lesions Neurologic: Grossly normal  EKG sinus rhythm at 64 without ST or T wave changes.  There was poor R wave progression noted.  I personally reviewed this EKG.  ASSESSMENT AND PLAN:   CAD S/P percutaneous coronary angioplasty History of CAD status post chronic catheterization by Dr. Ellyn Hack 5//20.  This revealed normal LV function with high-grade mid AV groove circumflex disease and diffuse calcified RCA disease with fairly normal LAD.  She underwent LAD intervention by Dr. Ellyn Hack with staged orbital atherectomy, PCI and drug-eluting stenting of the distal RCA by Dr. Angelena Form 01/06/2019.  I performed diagnostic cath on her 06/24/2019 revealing high-grade in-stent restenosis within the previously placed RCA stents which I was unable to cross.  She ultimately underwent CABG x1 with a vein to the distal RCA by Dr. Cyndia Bent and was discharged home 07/02/2019.  She has been asymptomatic  since.  Dyslipidemia, goal LDL below 70 History of dyslipidemia on high-dose statin therapy with lipid profile performed 03/11/2022 revealing total cholesterol of 145, LDL 59 and HDL of 67.  Mitral regurgitation 2D echocardiogram performed 11/07/2021 revealed normal LV systolic function, grade 2 diastolic dysfunction with moderate MR and mild to moderate AI.  She is asymptomatic.  We will repeat 2D echocardiogram in March of next year.  Palpitations History of palpitations in the past with event monitor performed 12/23/2021 revealing frequent PVCs with bigeminy trigeminy, runs of SVT.  She was placed on beta-blocker.  She attributes her palpitations to her Prolia shot which is a known side effect.  She saw Laurann Montana in the office on 03/11/2022 who changed her beta-blocker to twice daily.  Her palpitations have markedly improved over the last several months.     [name] MD FACP,FACC,FAHA, [facility] 06/04/2022 9:17 AM

## 2022-06-04 NOTE — Assessment & Plan Note (Signed)
History of palpitations in the past with event monitor performed 12/23/2021 revealing frequent PVCs with bigeminy trigeminy, runs of SVT.  She was placed on beta-blocker.  She attributes her palpitations to her Prolia shot which is a known side effect.  She saw Laurann Montana in the office on 03/11/2022 who changed her beta-blocker to twice daily.  Her palpitations have markedly improved over the last several months.

## 2022-06-04 NOTE — Assessment & Plan Note (Signed)
History of dyslipidemia on high-dose statin therapy with lipid profile performed 03/11/2022 revealing total cholesterol of 145, LDL 59 and HDL of 67.

## 2022-06-04 NOTE — Assessment & Plan Note (Signed)
2D echocardiogram performed 11/07/2021 revealed normal LV systolic function, grade 2 diastolic dysfunction with moderate MR and mild to moderate AI.  She is asymptomatic.  We will repeat 2D echocardiogram in March of next year.

## 2022-06-07 ENCOUNTER — Other Ambulatory Visit (HOSPITAL_BASED_OUTPATIENT_CLINIC_OR_DEPARTMENT_OTHER): Payer: Self-pay | Admitting: Family

## 2022-06-07 DIAGNOSIS — R42 Dizziness and giddiness: Secondary | ICD-10-CM

## 2022-06-07 DIAGNOSIS — R002 Palpitations: Secondary | ICD-10-CM

## 2022-06-07 NOTE — Telephone Encounter (Signed)
Pt of Dr. Gwenlyn Found. Please review for refill. Thank you!

## 2022-09-07 ENCOUNTER — Other Ambulatory Visit: Payer: Self-pay | Admitting: Hematology and Oncology

## 2022-09-09 DIAGNOSIS — L218 Other seborrheic dermatitis: Secondary | ICD-10-CM | POA: Diagnosis not present

## 2022-09-09 DIAGNOSIS — L821 Other seborrheic keratosis: Secondary | ICD-10-CM | POA: Diagnosis not present

## 2022-09-09 DIAGNOSIS — L603 Nail dystrophy: Secondary | ICD-10-CM | POA: Diagnosis not present

## 2022-09-17 ENCOUNTER — Ambulatory Visit: Payer: Medicare HMO | Admitting: Hematology and Oncology

## 2022-09-24 ENCOUNTER — Encounter: Payer: Self-pay | Admitting: Hematology and Oncology

## 2022-09-24 ENCOUNTER — Other Ambulatory Visit: Payer: Self-pay

## 2022-09-24 ENCOUNTER — Inpatient Hospital Stay: Payer: Medicare HMO | Attending: Hematology and Oncology | Admitting: Hematology and Oncology

## 2022-09-24 VITALS — BP 148/54 | HR 67 | Temp 97.5°F | Resp 16 | Wt 112.3 lb

## 2022-09-24 DIAGNOSIS — Z79811 Long term (current) use of aromatase inhibitors: Secondary | ICD-10-CM | POA: Insufficient documentation

## 2022-09-24 DIAGNOSIS — Z923 Personal history of irradiation: Secondary | ICD-10-CM | POA: Insufficient documentation

## 2022-09-24 DIAGNOSIS — Z170 Estrogen receptor positive status [ER+]: Secondary | ICD-10-CM | POA: Diagnosis not present

## 2022-09-24 DIAGNOSIS — C50512 Malignant neoplasm of lower-outer quadrant of left female breast: Secondary | ICD-10-CM | POA: Diagnosis not present

## 2022-09-24 NOTE — Assessment & Plan Note (Addendum)
01/18/2020:Palpable left breast lump with calcifications at 5 o'clock position measuring 3.3 cm.  Biopsy revealed grade 3 IDC ER 95%, PR 0%, HER-2 3+ positive, Ki-67 50%, axilla negative, T2N0 stage IIa clinical stage Bilateral implants removed 2005   Treatment plan: 1.  Neo- adjuvant chemotherapy with Taxol Herceptin  2.  Breast conserving surgery with sentinel lymph node biopsy 3.  Adjuvant radiation therapy completed 08/21/2020 4.  Adjuvant antiestrogen therapy with anastrozole 1 mg daily x5 years   Breast MRI: 2.3 cm mass, indeterminate 8 mm mass (Biopsy: ADH), no LN --------------------------------------------------------------------------------------------------------------------------------------------------------------- 06/15/20: Left lumpectomy ([facility]): no residual carcinoma, 4 left axillary lymph nodes negative for carcinoma   Plan: 1. Anti-HER-2 therapy  11/16/2020 (stopped early due to fatigue) 2. adjuvant radiation therapy completed 08/21/2020 3.  Adjuvant antiestrogen therapy with anastrozole 1 mg daily x5 years  started January 2022) --------------------------------------------------------------------------------------------------------------------   Anastrozole toxicities : Denies any adverse effects to anastrozole.  Denies any hot flashes or myalgias. Osteoporosis: In 2018 at bone density was T score -2.6:  Prolia injections every 6 months.     Breast cancer surveillance: 1.  Breast exam 09/24/2022: Palpable nodule in the left breast 5 o'clock position 2. mammogram and ultrasound 05/23/2022: Probably benign left breast calcifications: breast density category C Bone density 09/18/2021: T score -2.4: Severe osteopenia but improved from 2018 (was -2.6)  Plan: Obtain a mammogram ultrasound and biopsy of the left breast mass. Return to clinic based upon the pathology report.  If it is benign, return to clinic in 1 year for follow-up

## 2022-09-24 NOTE — Progress Notes (Signed)
Patient Care Team: [name], MD as PCP - General (Family Medicine) [name], MD as PCP - Cardiology (Cardiology) [name], MD as Surgeon (General Surgery) Kyung Rudd, MD as Consulting Physician (Radiation Oncology) Nicholas Lose, MD as Consulting Physician (Hematology and Oncology)  DIAGNOSIS:  Encounter Diagnosis  Name Primary?   Malignant neoplasm of lower-outer quadrant of left breast of female, estrogen receptor positive ([place]) Yes    SUMMARY OF ONCOLOGIC HISTORY: Oncology History  Malignant neoplasm of lower-outer quadrant of left breast of female, estrogen receptor positive ([place])  01/18/2020 Initial Diagnosis   Palpable left breast lump with calcifications at 5 o'clock position measuring 3.3 cm.  Biopsy revealed grade 3 IDC ER 95%, PR 0%, HER-2 3+ positive, Ki-67 50%, axilla negative, T2N0   01/26/2020 Cancer Staging   Staging form: Breast, AJCC 8th Edition - Clinical stage from 01/26/2020: Stage IIA (cT2, cN0, cM0, G3, ER+, PR-, HER2+) - Signed by Nicholas Lose, MD on 01/26/2020   02/16/2020 - 11/16/2020 Neo-Adjuvant Chemotherapy   Neoadjuvant weekly Taxol/Herceptin x 12 from 02/16/2020-05/13/2020, followed by Herceptin maintenance every 3 weeks, last given on 11/16/2020 stopped early due to increased fatigue.     06/15/2020 Surgery   Left lumpectomy ([facility]): no residual carcinoma, 4 left axillary lymph nodes negative for carcinoma   06/15/2020 Cancer Staging   Staging form: Breast, AJCC 8th Edition - Pathologic stage from 06/15/2020: No Stage Recommended (ypT0, pN0, cM0) - Signed by Gardenia Phlegm, NP on 03/07/2021 Stage prefix: Post-therapy Response to neoadjuvant therapy: Complete response   07/23/2020 - 08/22/2020 Radiation Therapy   Adjuvant radiation [facility]): The patient initially received a dose of 42.56 Gy in 16 fractions to the breast using whole-breast tangent fields. This was delivered using a 3-D conformal technique. The patient  then received a boost to the seroma. This delivered an additional 8 Gy in 4 fractions using a 3 field photon technique due to the depth of the seroma. The total dose was 50.56 Gy.   09/2020 -  Anti-estrogen oral therapy   Anastrozole daily     CHIEF COMPLIANT: Follow-up of left breast cancer   INTERVAL HISTORY: Rachel Valentine is a 82 y.o. with above-mentioned history of  left breast cancer who completed neoadjuvant chemotherapy, underwent a left lumpectomy, completed radiation, and also ompleted Herceptin. Mammogram on 05/19/2021 showed a few probable benign microcalcifications adjacent to lumpectomy site. She presents to the clinic today for follow up. She reports that she is tolerating the anastrozole extremely well with no complaints or side effects. She says she is staying active. She complains of tenderness in breast after mri.   ALLERGIES:  is allergic to paclitaxel, fosamax [alendronate], prednisone, clindamycin/lincomycin, and penicillins.  MEDICATIONS:  Current Outpatient Medications  Medication Sig Dispense Refill   anastrozole (ARIMIDEX) 1 MG tablet TAKE 1 TABLET BY MOUTH EVERY DAY 90 tablet 3   aspirin EC 81 MG tablet Take 81 mg by mouth daily. Swallow whole.     Biotin 10000 MCG TABS Take 10,000 mg by mouth daily.      Calcium-Phosphorus-Vitamin D (CITRACAL +D3 PO) Take 1 tablet by mouth in the morning and at bedtime.      ipratropium (ATROVENT) 0.03 % nasal spray Place 2 sprays into both nostrils daily.     metoprolol succinate (TOPROL-XL) 25 MG 24 hr tablet TAKE 1 TABLET (25 MG) BY MOUTH IN THE MORNING AND AT BEDTIME 90 tablet 3   nitroGLYCERIN (NITROSTAT) 0.4 MG SL tablet Place 1 tablet (0.4  mg total) under the tongue every 5 (five) minutes as needed for chest pain. 30 tablet 0   pantoprazole (PROTONIX) 40 MG tablet TAKE 1 TABLET BY MOUTH EVERY DAY 90 tablet 2   Polyvinyl Alcohol-Povidone PF (REFRESH) 1.4-0.6 % SOLN Apply 1 drop to eye 3 (three) times daily as  needed.     rosuvastatin (CRESTOR) 40 MG tablet Take 40 mg by mouth daily.      No current facility-administered medications for this visit.    PHYSICAL EXAMINATION: ECOG PERFORMANCE STATUS: 1 - Symptomatic but completely ambulatory  Vitals:   09/24/22 1421  BP: (!) 148/54  Pulse: 67  Resp: 16  Temp: (!) 97.5 F (36.4 C)  SpO2: 100%   Filed Weights   09/24/22 1421  Weight: 112 lb 5 oz (50.9 kg)    BREAST: Palpable nodule in the left breast next to the nipple (exam performed in the presence of a chaperone)  LABORATORY DATA:  I have reviewed the data as listed    Latest Ref Rng & Units 03/11/2022   11:37 AM 10/22/2021   11:03 AM 04/20/2021   10:56 AM  CMP  Glucose 70 - 99 mg/dL 102  88  98   BUN 8 - 27 mg/dL '17  20  19   '$ Creatinine 0.57 - 1.00 mg/dL 0.90  0.75  0.80   Sodium 134 - 144 mmol/L 140  139  142   Potassium 3.5 - 5.2 mmol/L 5.2  4.8  4.2   Chloride 96 - 106 mmol/L 102  106  105   CO2 20 - 29 mmol/L '20  30  28   '$ Calcium 8.7 - 10.3 mg/dL 9.9  9.5  9.6   Total Protein 6.0 - 8.5 g/dL 7.4  7.1  7.0   Total Bilirubin 0.0 - 1.2 mg/dL 0.4  0.4  0.4   Alkaline Phos 44 - 121 IU/L 81  69  78   AST 0 - 40 IU/L '27  18  18   '$ ALT 0 - 32 IU/L '10  8  7     '$ Lab Results  Component Value Date   WBC 8.8 03/11/2022   HGB 14.0 03/11/2022   HCT 41.7 03/11/2022   MCV 93 03/11/2022   PLT 273 03/11/2022   NEUTROABS 3.3 10/22/2021    ASSESSMENT & PLAN:  Malignant neoplasm of lower-outer quadrant of left breast of female, estrogen receptor positive ([place]) 01/18/2020:Palpable left breast lump with calcifications at 5 o'clock position measuring 3.3 cm.  Biopsy revealed grade 3 IDC ER 95%, PR 0%, HER-2 3+ positive, Ki-67 50%, axilla negative, T2N0 stage IIa clinical stage Bilateral implants removed 2005   Treatment plan: 1.  Neo- adjuvant chemotherapy with Taxol Herceptin  2.  Breast conserving surgery with sentinel lymph node biopsy 3.  Adjuvant radiation therapy completed  08/21/2020 4.  Adjuvant antiestrogen therapy with anastrozole 1 mg daily x5 years   Breast MRI: 2.3 cm mass, indeterminate 8 mm mass (Biopsy: ADH), no LN --------------------------------------------------------------------------------------------------------------------------------------------------------------- 06/15/20: Left lumpectomy ([facility]): no residual carcinoma, 4 left axillary lymph nodes negative for carcinoma   Plan: 1. Anti-HER-2 therapy  11/16/2020 (stopped early due to fatigue) 2. adjuvant radiation therapy completed 08/21/2020 3.  Adjuvant antiestrogen therapy with anastrozole 1 mg daily x5 years  started January 2022) --------------------------------------------------------------------------------------------------------------------   Anastrozole toxicities : Denies any adverse effects to anastrozole.  Denies any hot flashes or myalgias. Osteoporosis: In 2018 at bone density was T score -2.6:  Prolia injections every 6 months.  Breast cancer surveillance: 1.  Breast exam 09/24/2022: Palpable nodule in the left breast 5 o'clock position 2. mammogram and ultrasound 05/23/2022: Probably benign left breast calcifications: breast density category C Bone density 09/18/2021: T score -2.4: Severe osteopenia but improved from 2018 (was -2.6)  Plan: Obtain a mammogram ultrasound and biopsy of the left breast mass. Return to clinic based upon the pathology report. She plans to go to [place] at the end of February 2024.  If it is benign, return to clinic in 1 year for follow-up    Orders Placed This Encounter  Procedures   US BREAST LTD UNI LEFT INC AXILLA    Standing Status:   Future    Standing Expiration Date:   09/25/2023    Order Specific Question:   Reason for Exam (SYMPTOM  OR DIAGNOSIS REQUIRED)    Answer:   nodole left breast    Order Specific Question:   Preferred imaging location?    Answer:   [facility]   MM DIAG BREAST TOMO UNI LEFT    Standing  Status:   Future    Standing Expiration Date:   09/25/2023    Order Specific Question:   Reason for Exam (SYMPTOM  OR DIAGNOSIS REQUIRED)    Answer:   nodule left breat    Order Specific Question:   Preferred imaging location?    Answer:   [facility]   The patient has a good understanding of the overall plan. she agrees with it. she will call with any problems that may develop before the next visit here. Total time spent: 30 mins including face to face time and time spent for planning, charting and co-ordination of care   Harriette Ohara, MD 09/24/22    I Gardiner Coins am acting as a Education administrator for [employer]  I have reviewed the above documentation for accuracy and completeness, and I agree with the above.

## 2022-09-25 ENCOUNTER — Telehealth: Payer: Self-pay | Admitting: Hematology and Oncology

## 2022-09-25 NOTE — Telephone Encounter (Signed)
Scheduled appointment per 1/23 los. Left voicemail.

## 2022-10-02 ENCOUNTER — Encounter: Payer: Self-pay | Admitting: Hematology and Oncology

## 2022-10-03 ENCOUNTER — Other Ambulatory Visit: Payer: Self-pay | Admitting: Hematology and Oncology

## 2022-10-03 ENCOUNTER — Ambulatory Visit
Admission: RE | Admit: 2022-10-03 | Discharge: 2022-10-03 | Disposition: A | Discharge status: Home / Self Care | Payer: Medicare HMO | Source: Ambulatory Visit | Attending: Hematology and Oncology | Admitting: Hematology and Oncology

## 2022-10-03 DIAGNOSIS — C50512 Malignant neoplasm of lower-outer quadrant of left female breast: Secondary | ICD-10-CM

## 2022-10-03 DIAGNOSIS — N6321 Unspecified lump in the left breast, upper outer quadrant: Secondary | ICD-10-CM | POA: Diagnosis not present

## 2022-10-07 DIAGNOSIS — M810 Age-related osteoporosis without current pathological fracture: Secondary | ICD-10-CM | POA: Diagnosis not present

## 2022-10-07 DIAGNOSIS — C50412 Malignant neoplasm of upper-outer quadrant of left female breast: Secondary | ICD-10-CM | POA: Diagnosis not present

## 2022-10-07 DIAGNOSIS — I2510 Atherosclerotic heart disease of native coronary artery without angina pectoris: Secondary | ICD-10-CM | POA: Diagnosis not present

## 2022-10-07 DIAGNOSIS — I700 Atherosclerosis of aorta: Secondary | ICD-10-CM | POA: Diagnosis not present

## 2022-10-07 DIAGNOSIS — R7303 Prediabetes: Secondary | ICD-10-CM | POA: Diagnosis not present

## 2022-10-07 DIAGNOSIS — Z79899 Other long term (current) drug therapy: Secondary | ICD-10-CM | POA: Diagnosis not present

## 2022-10-07 DIAGNOSIS — L989 Disorder of the skin and subcutaneous tissue, unspecified: Secondary | ICD-10-CM | POA: Diagnosis not present

## 2022-10-07 DIAGNOSIS — E440 Moderate protein-calorie malnutrition: Secondary | ICD-10-CM | POA: Diagnosis not present

## 2022-10-09 ENCOUNTER — Other Ambulatory Visit: Payer: Self-pay | Admitting: Cardiovascular Disease

## 2022-10-09 ENCOUNTER — Ambulatory Visit
Admission: RE | Admit: 2022-10-09 | Discharge: 2022-10-09 | Disposition: A | Discharge status: Home / Self Care | Payer: Medicare HMO | Source: Ambulatory Visit | Attending: Hematology and Oncology | Admitting: Hematology and Oncology

## 2022-10-09 DIAGNOSIS — R42 Dizziness and giddiness: Secondary | ICD-10-CM

## 2022-10-09 DIAGNOSIS — N6032 Fibrosclerosis of left breast: Secondary | ICD-10-CM | POA: Diagnosis not present

## 2022-10-09 DIAGNOSIS — R002 Palpitations: Secondary | ICD-10-CM

## 2022-10-09 DIAGNOSIS — K2100 Gastro-esophageal reflux disease with esophagitis, without bleeding: Secondary | ICD-10-CM

## 2022-10-09 DIAGNOSIS — C50512 Malignant neoplasm of lower-outer quadrant of left female breast: Secondary | ICD-10-CM

## 2022-10-09 DIAGNOSIS — N6323 Unspecified lump in the left breast, lower outer quadrant: Secondary | ICD-10-CM | POA: Diagnosis not present

## 2022-10-09 HISTORY — PX: BREAST BIOPSY: SHX20

## 2022-10-14 ENCOUNTER — Ambulatory Visit: Payer: Medicare HMO

## 2022-12-10 ENCOUNTER — Ambulatory Visit (HOSPITAL_COMMUNITY): Payer: Medicare HMO | Attending: Cardiovascular Disease

## 2022-12-10 DIAGNOSIS — I083 Combined rheumatic disorders of mitral, aortic and tricuspid valves: Secondary | ICD-10-CM | POA: Diagnosis not present

## 2022-12-10 DIAGNOSIS — I340 Nonrheumatic mitral (valve) insufficiency: Secondary | ICD-10-CM

## 2022-12-10 LAB — ECHOCARDIOGRAM COMPLETE
Area-P 1/2: 4.06 cm2
MV M vel: 4.69 m/s
MV Peak grad: 88 mmHg
P 1/2 time: 412 msec
Radius: 0.6 cm
S' Lateral: 2.2 cm

## 2022-12-25 DIAGNOSIS — H52223 Regular astigmatism, bilateral: Secondary | ICD-10-CM | POA: Diagnosis not present

## 2023-01-06 DIAGNOSIS — L570 Actinic keratosis: Secondary | ICD-10-CM | POA: Diagnosis not present

## 2023-01-06 DIAGNOSIS — L603 Nail dystrophy: Secondary | ICD-10-CM | POA: Diagnosis not present

## 2023-01-06 DIAGNOSIS — X32XXXA Exposure to sunlight, initial encounter: Secondary | ICD-10-CM | POA: Diagnosis not present

## 2023-01-23 DIAGNOSIS — J3081 Allergic rhinitis due to animal (cat) (dog) hair and dander: Secondary | ICD-10-CM | POA: Diagnosis not present

## 2023-01-23 DIAGNOSIS — J3089 Other allergic rhinitis: Secondary | ICD-10-CM | POA: Diagnosis not present

## 2023-01-23 DIAGNOSIS — H1045 Other chronic allergic conjunctivitis: Secondary | ICD-10-CM | POA: Diagnosis not present

## 2023-01-23 DIAGNOSIS — J301 Allergic rhinitis due to pollen: Secondary | ICD-10-CM | POA: Diagnosis not present

## 2023-02-26 ENCOUNTER — Telehealth: Payer: Self-pay

## 2023-02-26 DIAGNOSIS — Z170 Estrogen receptor positive status [ER+]: Secondary | ICD-10-CM

## 2023-02-26 NOTE — Telephone Encounter (Signed)
O1308 - A Prospective Observational Cohort Study to Develop a Predictive Model of Taxane-Induced Peripheral Neuropathy in Cancer Patients   Called the patient to see if they were available to complete their follow up questionnaires for the s1714 study. The patient did not answer so I left a voice message with contact information. I will try and get into contact with the patient again tomorrow.   Rachel Valentine, [facility] 02/26/2023 4:20 PM

## 2023-02-27 ENCOUNTER — Telehealth: Payer: Self-pay

## 2023-02-27 DIAGNOSIS — Z170 Estrogen receptor positive status [ER+]: Secondary | ICD-10-CM

## 2023-02-27 NOTE — Telephone Encounter (Signed)
J4782 - A Prospective Observational Cohort Study to Develop a Predictive Model of Taxane-Induced Peripheral Neuropathy in Cancer Patients   02/27/23 - Week 156   Called to conduct week 156 visit over phone. Patient reports tingling, numbness, and occasionally pain in her bilateral toes and feet. Patient.  Overall she reports her symptoms have improved since immediately after chemotherapy completion.  She reports that her symptoms do not affect her daily activities.     The following PROs were completed verbally over the phone today: EORTC [id number], FACT/GOG-NTX-4, and PRO-CTCAE.     The patient was told that this is the last assessment that will be conducted for the this study. The patient was thanked for her time and didn't have any further questions at this time.   Felecia Jan, [facility] 02/27/2023 4:19 PM

## 2023-04-10 ENCOUNTER — Encounter: Payer: Self-pay | Admitting: Cardiovascular Disease

## 2023-04-10 DIAGNOSIS — I700 Atherosclerosis of aorta: Secondary | ICD-10-CM | POA: Diagnosis not present

## 2023-04-10 DIAGNOSIS — E559 Vitamin D deficiency, unspecified: Secondary | ICD-10-CM | POA: Diagnosis not present

## 2023-04-10 DIAGNOSIS — C50412 Malignant neoplasm of upper-outer quadrant of left female breast: Secondary | ICD-10-CM | POA: Diagnosis not present

## 2023-04-10 DIAGNOSIS — Z0000 Encounter for general adult medical examination without abnormal findings: Secondary | ICD-10-CM | POA: Diagnosis not present

## 2023-04-10 DIAGNOSIS — E440 Moderate protein-calorie malnutrition: Secondary | ICD-10-CM | POA: Diagnosis not present

## 2023-04-10 DIAGNOSIS — E7800 Pure hypercholesterolemia, unspecified: Secondary | ICD-10-CM | POA: Diagnosis not present

## 2023-04-10 DIAGNOSIS — R7301 Impaired fasting glucose: Secondary | ICD-10-CM | POA: Diagnosis not present

## 2023-04-10 DIAGNOSIS — I2510 Atherosclerotic heart disease of native coronary artery without angina pectoris: Secondary | ICD-10-CM | POA: Diagnosis not present

## 2023-04-10 DIAGNOSIS — L608 Other nail disorders: Secondary | ICD-10-CM | POA: Diagnosis not present

## 2023-04-10 DIAGNOSIS — M810 Age-related osteoporosis without current pathological fracture: Secondary | ICD-10-CM | POA: Diagnosis not present

## 2023-04-10 DIAGNOSIS — J302 Other seasonal allergic rhinitis: Secondary | ICD-10-CM | POA: Diagnosis not present

## 2023-04-10 DIAGNOSIS — I10 Essential (primary) hypertension: Secondary | ICD-10-CM | POA: Diagnosis not present

## 2023-04-11 NOTE — Telephone Encounter (Signed)
Med list updated to reflect change from metoprolol succinate 25mg  BID to 12.5mg  BID

## 2023-04-14 ENCOUNTER — Ambulatory Visit: Payer: Medicare HMO

## 2023-04-23 DIAGNOSIS — Z87891 Personal history of nicotine dependence: Secondary | ICD-10-CM | POA: Diagnosis not present

## 2023-04-23 DIAGNOSIS — K219 Gastro-esophageal reflux disease without esophagitis: Secondary | ICD-10-CM | POA: Diagnosis not present

## 2023-04-23 DIAGNOSIS — E785 Hyperlipidemia, unspecified: Secondary | ICD-10-CM | POA: Diagnosis not present

## 2023-04-23 DIAGNOSIS — I25119 Atherosclerotic heart disease of native coronary artery with unspecified angina pectoris: Secondary | ICD-10-CM | POA: Diagnosis not present

## 2023-04-23 DIAGNOSIS — J301 Allergic rhinitis due to pollen: Secondary | ICD-10-CM | POA: Diagnosis not present

## 2023-04-23 DIAGNOSIS — I471 Supraventricular tachycardia, unspecified: Secondary | ICD-10-CM | POA: Diagnosis not present

## 2023-04-23 DIAGNOSIS — M810 Age-related osteoporosis without current pathological fracture: Secondary | ICD-10-CM | POA: Diagnosis not present

## 2023-04-23 DIAGNOSIS — Z8249 Family history of ischemic heart disease and other diseases of the circulatory system: Secondary | ICD-10-CM | POA: Diagnosis not present

## 2023-04-23 DIAGNOSIS — J383 Other diseases of vocal cords: Secondary | ICD-10-CM | POA: Diagnosis not present

## 2023-04-23 DIAGNOSIS — Z008 Encounter for other general examination: Secondary | ICD-10-CM | POA: Diagnosis not present

## 2023-04-23 DIAGNOSIS — Z823 Family history of stroke: Secondary | ICD-10-CM | POA: Diagnosis not present

## 2023-04-23 DIAGNOSIS — I129 Hypertensive chronic kidney disease with stage 1 through stage 4 chronic kidney disease, or unspecified chronic kidney disease: Secondary | ICD-10-CM | POA: Diagnosis not present

## 2023-04-23 DIAGNOSIS — I700 Atherosclerosis of aorta: Secondary | ICD-10-CM | POA: Diagnosis not present

## 2023-04-28 ENCOUNTER — Encounter: Payer: Self-pay | Admitting: Hematology and Oncology

## 2023-04-30 ENCOUNTER — Other Ambulatory Visit: Payer: Self-pay | Admitting: Family Medicine

## 2023-04-30 DIAGNOSIS — Z1231 Encounter for screening mammogram for malignant neoplasm of breast: Secondary | ICD-10-CM

## 2023-05-07 ENCOUNTER — Ambulatory Visit: Payer: Medicare HMO | Admitting: Podiatry

## 2023-05-07 DIAGNOSIS — B351 Tinea unguium: Secondary | ICD-10-CM

## 2023-05-07 MED ORDER — CICLOPIROX 8 % EX SOLN: Freq: Every day | CUTANEOUS | 0 refills | Status: AC

## 2023-05-07 NOTE — Progress Notes (Signed)
Subjective:  Patient ID: Rachel Valentine, female    DOB: 1941-03-04,  MRN: [id number]  Chief Complaint  Patient presents with   Nail Problem    Nail discoloration     82 y.o. female presents with the above complaint.  Patient presents with right hallux second and third digit thickened likely dystrophic mycotic toenail x 3.  She wanted discuss treatment options for it.  She has a history of chemotherapy.  She has not seen MRIs prior to seeing me denies any other acute complaints.  Hurts with ambulation worse with pressure.  She has tried some over-the-counter option which has not helped.  She would like to discuss other treatment options for nail fungus.   Review of Systems: Negative except as noted in the HPI. Denies N/V/F/Ch.  Past Medical History:  Diagnosis Date   Anemia    during chemo   Bowel obstruction (HCC)    Cancer (HCC)    dx of breast cancer 2021 - per pt in PAT 02/09/20   Chronic kidney disease    blood clot in right kidney after surgery in June, 2021   Coronary artery disease    GERD (gastroesophageal reflux disease)    Hypertension    Personal history of chemotherapy    Personal history of radiation therapy    Pneumonia    PONV (postoperative nausea and vomiting)     Current Outpatient Medications:    azelastine (OPTIVAR) 0.05 % ophthalmic solution, 1 drop into affected eye Ophthalmic Twice a day for 30 days, Disp: , Rfl:    Azelastine HCl 137 MCG/SPRAY SOLN, 1 spray ea nostril Nasally Twice a day for 30 days, Disp: , Rfl:    calcium citrate (CALCITRATE - DOSED IN MG ELEMENTAL CALCIUM) 950 (200 Ca) MG tablet, Take by mouth., Disp: , Rfl:    ciclopirox (PENLAC) 8 % solution, Apply topically at bedtime. Apply over nail and surrounding skin. Apply daily over previous coat. After seven (7) days, may remove with alcohol and continue cycle., Disp: 6.6 mL, Rfl: 0   clobetasol (TEMOVATE) 0.05 % external solution, Apply topically 2 (two) times daily as needed.,  Disp: , Rfl:    denosumab (PROLIA) 60 MG/ML SOSY injection, Inject into the skin., Disp: , Rfl:    fexofenadine (ALLEGRA) 180 MG tablet, 1 tablet Swallow whole with water; do not take with fruit juices. Orally Once a day, Disp: , Rfl:    fluticasone (FLONASE) 50 MCG/ACT nasal spray, 1 spray Once Daily., Disp: , Rfl:    montelukast (SINGULAIR) 10 MG tablet, 1 tablet Orally Once a day for 30 days, Disp: , Rfl:    mupirocin ointment (BACTROBAN) 2 %, 1 application Externally Twice a day for 5 days, Disp: , Rfl:    acetaminophen (TYLENOL) 500 MG tablet, 1 tablet as needed Orally every 6 hrs, Disp: , Rfl:    anastrozole (ARIMIDEX) 1 MG tablet, TAKE 1 TABLET BY MOUTH EVERY DAY, Disp: 90 tablet, Rfl: 3   aspirin EC 81 MG tablet, Take 81 mg by mouth daily. Swallow whole., Disp: , Rfl:    Biotin 40981 MCG TABS, Take 10,000 mg by mouth daily. , Disp: , Rfl:    Calcium-Magnesium-Vitamin D (CITRACAL CALCIUM+D) 600-40-500 MG-MG-UNIT TB24, 1 tablets Orally twice a day, Disp: , Rfl:    Calcium-Phosphorus-Vitamin D (CITRACAL +D3 PO), Take 1 tablet by mouth in the morning and at bedtime. , Disp: , Rfl:    ipratropium (ATROVENT) 0.03 % nasal spray, Place 2 sprays into both nostrils  daily., Disp: , Rfl:    levocetirizine (XYZAL) 5 MG tablet, 1 tablet in the evening Orally Once a day, Disp: , Rfl:    metoprolol succinate (TOPROL-XL) 25 MG 24 hr tablet, Take 12.5 mg by mouth in the morning and at bedtime., Disp: , Rfl:    nitroGLYCERIN (NITROSTAT) 0.4 MG SL tablet, Place 1 tablet (0.4 mg total) under the tongue every 5 (five) minutes as needed for chest pain., Disp: 30 tablet, Rfl: 0   ondansetron (ZOFRAN) 8 MG tablet, TAKE 1 TABLET BY MOUTH EVERY DAY AS NEEDED FOR 30 DAYS for 30, Disp: , Rfl:    pantoprazole (PROTONIX) 40 MG tablet, TAKE 1 TABLET BY MOUTH EVERY DAY, Disp: 90 tablet, Rfl: 2   Polyvinyl Alcohol-Povidone PF (REFRESH) 1.4-0.6 % SOLN, Apply 1 drop to eye 3 (three) times daily as needed., Disp: , Rfl:     rosuvastatin (CRESTOR) 40 MG tablet, Take 40 mg by mouth daily. , Disp: , Rfl:    Specialty Vitamins Products (BIOTIN PLUS KERATIN) 10000-100 MCG-MG TABS, 1 tablet Orally once a day, Disp: , Rfl:    valACYclovir (VALTREX) 1000 MG tablet, 2 tablets Orally once a day as needed for a flare up, Disp: , Rfl:   Social History   Tobacco Use  Smoking Status Former   Current packs/day: 0.00   Types: Cigarettes   Quit date: 01/06/1999   Years since quitting: 24.3  Smokeless Tobacco Never    Allergies  Allergen Reactions   Paclitaxel Rash   Fosamax [Alendronate] Nausea Only    Unset stomach   Prednisone Other (See Comments)    Blood pressure high when she takes it   Clindamycin/Lincomycin Rash   Penicillins Rash    Did it involve swelling of the face/tongue/throat, SOB, or low BP? No  Did it involve sudden or severe rash/hives, skin peeling, or any reaction on the inside of your mouth or nose? No  Did you need to seek medical attention at a hospital or doctor's office? Yes  When did it last happen?      Childhood  If all above answers are "NO", may proceed with cephalosporin use.   Objective:  There were no vitals filed for this visit. There is no height or weight on file to calculate BMI. Constitutional Well developed. Well nourished.  Vascular Dorsalis pedis pulses palpable bilaterally. Posterior tibial pulses palpable bilaterally. Capillary refill normal to all digits.  No cyanosis or clubbing noted. Pedal hair growth normal.  Neurologic Normal speech. Oriented to person, place, and time. Epicritic sensation to light touch grossly present bilaterally.  Dermatologic Nails thickened onychodystrophy mycotic toenails x 3 to right second third and fourth digit Skin within normal limits  Orthopedic: Normal joint ROM without pain or crepitus bilaterally. No visible deformities. No bony tenderness.   Radiographs: None Assessment:   1. Nail fungus   2. Onychomycosis due to  dermatophyte    Plan:  Patient was evaluated and treated and all questions answered.  Right hallux second and third onychomycosis -Educated the patient on the etiology of onychomycosis and various treatment options associated with improving the fungal load.  I explained to the patient that there is 3 treatment options available to treat the onychomycosis including topical, p.o., laser treatment.  Patient elected to undergo topical medication with Penlac.  I have asked her to apply it up to twice a day.  She states understanding will do so immediately.   No follow-ups on file.

## 2023-05-09 ENCOUNTER — Telehealth: Payer: Self-pay

## 2023-05-09 DIAGNOSIS — Z170 Estrogen receptor positive status [ER+]: Secondary | ICD-10-CM

## 2023-05-09 NOTE — Telephone Encounter (Signed)
Q6578 - A Prospective Observational Cohort Study to Develop a Predictive Model of Taxane-Induced Peripheral Neuropathy in Cancer Patients  Called the patient to complete the s1714 solicited neuropathy form as a part of the s1714 study. The patient answered all questions and was thanked for their time   Felecia Jan, [facility] 05/09/2023 1:22 PM

## 2023-05-23 ENCOUNTER — Emergency Department (HOSPITAL_BASED_OUTPATIENT_CLINIC_OR_DEPARTMENT_OTHER): Payer: Medicare HMO | Admitting: Radiology

## 2023-05-23 ENCOUNTER — Other Ambulatory Visit: Payer: Self-pay

## 2023-05-23 ENCOUNTER — Emergency Department (HOSPITAL_BASED_OUTPATIENT_CLINIC_OR_DEPARTMENT_OTHER)
Admission: EM | Admit: 2023-05-23 | Discharge: 2023-05-23 | Disposition: A | Discharge status: Home / Self Care | Payer: Medicare HMO | Attending: Emergency Medicine | Admitting: Emergency Medicine

## 2023-05-23 ENCOUNTER — Encounter (HOSPITAL_BASED_OUTPATIENT_CLINIC_OR_DEPARTMENT_OTHER): Payer: Self-pay | Admitting: Emergency Medicine

## 2023-05-23 DIAGNOSIS — Z951 Presence of aortocoronary bypass graft: Secondary | ICD-10-CM | POA: Insufficient documentation

## 2023-05-23 DIAGNOSIS — R531 Weakness: Secondary | ICD-10-CM | POA: Diagnosis not present

## 2023-05-23 DIAGNOSIS — Z7982 Long term (current) use of aspirin: Secondary | ICD-10-CM | POA: Insufficient documentation

## 2023-05-23 DIAGNOSIS — R002 Palpitations: Secondary | ICD-10-CM | POA: Diagnosis not present

## 2023-05-23 DIAGNOSIS — I2510 Atherosclerotic heart disease of native coronary artery without angina pectoris: Secondary | ICD-10-CM | POA: Insufficient documentation

## 2023-05-23 DIAGNOSIS — I700 Atherosclerosis of aorta: Secondary | ICD-10-CM | POA: Diagnosis not present

## 2023-05-23 DIAGNOSIS — R42 Dizziness and giddiness: Secondary | ICD-10-CM | POA: Diagnosis not present

## 2023-05-23 DIAGNOSIS — R918 Other nonspecific abnormal finding of lung field: Secondary | ICD-10-CM | POA: Diagnosis not present

## 2023-05-23 DIAGNOSIS — Z79899 Other long term (current) drug therapy: Secondary | ICD-10-CM | POA: Insufficient documentation

## 2023-05-23 LAB — BASIC METABOLIC PANEL
Anion gap: 9 (ref 5–15)
BUN: 15 mg/dL (ref 8–23)
CO2: 28 mmol/L (ref 22–32)
Calcium: 9.5 mg/dL (ref 8.9–10.3)
Chloride: 103 mmol/L (ref 98–111)
Creatinine, Ser: 0.8 mg/dL (ref 0.44–1.00)
GFR, Estimated: >60 mL/min (ref >60)
Glucose, Bld: 96 mg/dL (ref 70–99)
Potassium: 4 mmol/L (ref 3.5–5.1)
Sodium: 140 mmol/L (ref 135–145)

## 2023-05-23 LAB — CBC
HCT: 41.2 % (ref 36.0–46.0)
Hemoglobin: 13.4 g/dL (ref 12.0–15.0)
MCH: 29.8 pg (ref 26.0–34.0)
MCHC: 32.5 g/dL (ref 30.0–36.0)
MCV: 91.8 fL (ref 80.0–100.0)
Platelets: 252 10*3/uL (ref 150–400)
RBC: 4.49 MIL/uL (ref 3.87–5.11)
RDW: 13.7 % (ref 11.5–15.5)
WBC: 8.3 10*3/uL (ref 4.0–10.5)
nRBC: 0 % (ref 0.0–0.2)

## 2023-05-23 LAB — TROPONIN I (HIGH SENSITIVITY)
Troponin I (High Sensitivity): 4 ng/L (ref <18)
Troponin I (High Sensitivity): 4 ng/L (ref <18)

## 2023-05-23 NOTE — Discharge Instructions (Addendum)
You were seen for your palpitations in the emergency department.   At home, please try taking your metoprolol once a day (12.5 mg daily) to see if it improves your symptoms.   Follow-up with your primary doctor in 2-3 days regarding your visit.  Cardiology will be calling you regarding an appointment within the next 72 hours.  You may contact them if you do not hear from them in that time using the information in this packet.  Please talk to them to see if you need an outpatient (Holter) monitor.  Return immediately to the emergency department if you experience any of the following: Chest pain, shortness of breath, fainting, or any other concerning symptoms.    Thank you for visiting our Emergency Department. It was a pleasure taking care of you today.

## 2023-05-23 NOTE — ED Provider Notes (Signed)
[place] EMERGENCY DEPARTMENT AT [facility] Provider Note   CSN: [id number] Arrival date & time: 05/23/23  1453     History {Add pertinent medical, surgical, social history, OB history to HPI:1} Chief Complaint  Patient presents with   Palpitations    Rachel Valentine is a 82 y.o. female.  82 year old female with a history of CAD status post CABG says that for the past 3 days has felt weak and lightheaded.  Occasionally be having palpitations that last for several minutes at a time and then spontaneously resolved.  Has not had any syncopal events.  No fevers, runny nose, sore throat, nausea, vomiting, diarrhea, or chest pain.  Called her outpatient doctor who referred her to the emergency department for additional evaluation. Says that BP at home was 101 which was low for her.        Home Medications Prior to Admission medications   Medication Sig Start Date End Date Taking? Authorizing Provider  acetaminophen (TYLENOL) 500 MG tablet 1 tablet as needed Orally every 6 hrs    [provider]  anastrozole (ARIMIDEX) 1 MG tablet TAKE 1 TABLET BY MOUTH EVERY DAY 09/09/22   Serena Croissant, MD  aspirin EC 81 MG tablet Take 81 mg by mouth daily. Swallow whole.    [provider]  azelastine (OPTIVAR) 0.05 % ophthalmic solution 1 drop into affected eye Ophthalmic Twice a day for 30 days 06/04/21   [provider]  Azelastine HCl 137 MCG/SPRAY SOLN 1 spray ea nostril Nasally Twice a day for 30 days 01/23/23   [provider]  Biotin 88416 MCG TABS Take 10,000 mg by mouth daily.     [provider]  calcium citrate (CALCITRATE - DOSED IN MG ELEMENTAL CALCIUM) 950 (200 Ca) MG tablet Take by mouth. 12/16/17   [provider]  Calcium-Magnesium-Vitamin D (CITRACAL CALCIUM+D) 600-40-500 MG-MG-UNIT TB24 1 tablets Orally twice a day    [provider]  Calcium-Phosphorus-Vitamin D (CITRACAL +D3 PO) Take 1 tablet by mouth  in the morning and at bedtime.     [provider]  ciclopirox (PENLAC) 8 % solution Apply topically at bedtime. Apply over nail and surrounding skin. Apply daily over previous coat. After seven (7) days, may remove with alcohol and continue cycle. 05/07/23   Candelaria Stagers, DPM  clobetasol (TEMOVATE) 0.05 % external solution Apply topically 2 (two) times daily as needed. 01/08/23   [provider]  denosumab (PROLIA) 60 MG/ML SOSY injection Inject into the skin. 04/10/22   [provider]  fexofenadine (ALLEGRA) 180 MG tablet 1 tablet Swallow whole with water; do not take with fruit juices. Orally Once a day 01/23/22   [provider]  fluticasone (FLONASE) 50 MCG/ACT nasal spray 1 spray Once Daily. 12/16/17   [provider]  ipratropium (ATROVENT) 0.03 % nasal spray Place 2 sprays into both nostrils daily.    [provider]  levocetirizine (XYZAL) 5 MG tablet 1 tablet in the evening Orally Once a day    [provider]  metoprolol succinate (TOPROL-XL) 25 MG 24 hr tablet Take 12.5 mg by mouth in the morning and at bedtime.    [provider]  montelukast (SINGULAIR) 10 MG tablet 1 tablet Orally Once a day for 30 days 01/23/23   [provider]  mupirocin ointment (BACTROBAN) 2 % 1 application Externally Twice a day for 5 days 10/07/22   [provider]  nitroGLYCERIN (NITROSTAT) 0.4 MG SL tablet Place 1  tablet (0.4 mg total) under the tongue every 5 (five) minutes as needed for chest pain. 01/06/19   Rolly Salter, MD  ondansetron (ZOFRAN) 8 MG tablet TAKE 1 TABLET BY MOUTH EVERY DAY AS NEEDED FOR 30 DAYS for 30    [provider]  pantoprazole (PROTONIX) 40 MG tablet TAKE 1 TABLET BY MOUTH EVERY DAY 10/10/22   Runell Gess, MD  Polyvinyl Alcohol-Povidone PF (REFRESH) 1.4-0.6 % SOLN Apply 1 drop to eye 3 (three) times daily as needed.    [provider]  rosuvastatin (CRESTOR) 40 MG tablet Take 40  mg by mouth daily.  06/10/19   [provider]  Specialty Vitamins Products (BIOTIN PLUS KERATIN) 10000-100 MCG-MG TABS 1 tablet Orally once a day    [provider]  valACYclovir (VALTREX) 1000 MG tablet 2 tablets Orally once a day as needed for a flare up    [provider]      Allergies    Paclitaxel, Fosamax [alendronate], Prednisone, Clindamycin/lincomycin, and Penicillins    Review of Systems   Review of Systems  Physical Exam Updated Vital Signs BP (!) 154/62 (BP Location: Right Arm)   Pulse 71   Temp 98.1 F (36.7 C) (Oral)   Resp 16   LMP  (LMP Unknown)   SpO2 100%  Physical Exam Vitals and nursing note reviewed.  Constitutional:      General: She is not in acute distress.    Appearance: She is well-developed.  HENT:     Head: Normocephalic and atraumatic.     Right Ear: External ear normal.     Left Ear: External ear normal.     Nose: Nose normal.  Eyes:     Extraocular Movements: Extraocular movements intact.     Conjunctiva/sclera: Conjunctivae normal.     Pupils: Pupils are equal, round, and reactive to light.  Cardiovascular:     Rate and Rhythm: Normal rate and regular rhythm.     Heart sounds: No murmur heard. Pulmonary:     Effort: Pulmonary effort is normal. No respiratory distress.     Breath sounds: Normal breath sounds.  Musculoskeletal:     Cervical back: Normal range of motion and neck supple.     Right lower leg: No edema.     Left lower leg: No edema.  Skin:    General: Skin is warm and dry.  Neurological:     Mental Status: She is alert and oriented to person, place, and time. Mental status is at baseline.  Psychiatric:        Mood and Affect: Mood normal.     ED Results / Procedures / Treatments   Labs (all labs ordered are listed, but only abnormal results are displayed) Labs Reviewed  CBC  BASIC METABOLIC PANEL  TROPONIN I (HIGH SENSITIVITY)    EKG EKG Interpretation Date/Time:  Friday May 23 2023 15:04:55 EDT Ventricular Rate:  70 PR Interval:  156 QRS Duration:  78 QT Interval:  420 QTC Calculation: 453 R Axis:   35  Text Interpretation: Normal sinus rhythm Possible Anterior infarct , age undetermined Abnormal ECG When compared with ECG of 28-Jun-2019 11:11, Borderline criteria for Anterior infarct are now Present Non-specific change in ST segment in Lateral leads T wave inversion no longer evident in Inferior leads Nonspecific T wave abnormality now evident in Anterior leads Confirmed by Vonita Moss [phone]) on 05/23/2023 3:14:19 PM  Radiology No results found.  Procedures Procedures  {Document cardiac monitor, telemetry  assessment procedure when appropriate:1}  Medications Ordered in ED Medications - No data to display  ED Course/ Medical Decision Making/ A&P Clinical Course as of 05/23/23 1827  Fri May 23, 2023  1644 Chest x-ray with possible bronchitis [RP]    Clinical Course User Index [RP] Rondel Baton, MD   {   Click here for ABCD2, HEART and other calculatorsREFRESH Note before signing :1}                              Medical Decision Making Amount and/or Complexity of Data Reviewed Labs: ordered. Radiology: ordered.   ***  {Document critical care time when appropriate:1} {Document review of labs and clinical decision tools ie heart score, Chads2Vasc2 etc:1}  {Document your independent review of radiology images, and any outside records:1} {Document your discussion with family members, caretakers, and with consultants:1} {Document social determinants of health affecting pt's care:1} {Document your decision making why or why not admission, treatments were needed:1} Final Clinical Impression(s) / ED Diagnoses Final diagnoses:  None    Rx / DC Orders ED Discharge Orders     None

## 2023-05-23 NOTE — ED Triage Notes (Addendum)
Pt from home reporting low BP at home yesterday and feelings of palpitations, headache, and weakness with light headedness.   Reports CABG about 4 years ago and states at that time her EKG and labs were normal so she just wanted to get evaluated to be sure she was okay today.

## 2023-05-25 ENCOUNTER — Other Ambulatory Visit: Payer: Self-pay | Admitting: Cardiovascular Disease

## 2023-05-25 DIAGNOSIS — R002 Palpitations: Secondary | ICD-10-CM

## 2023-05-25 DIAGNOSIS — R42 Dizziness and giddiness: Secondary | ICD-10-CM

## 2023-05-27 ENCOUNTER — Ambulatory Visit
Admission: RE | Admit: 2023-05-27 | Discharge: 2023-05-27 | Disposition: A | Discharge status: Home / Self Care | Payer: Medicare HMO | Source: Ambulatory Visit | Attending: Family Medicine | Admitting: Family Medicine

## 2023-05-27 DIAGNOSIS — Z1231 Encounter for screening mammogram for malignant neoplasm of breast: Secondary | ICD-10-CM

## 2023-05-28 DIAGNOSIS — H16142 Punctate keratitis, left eye: Secondary | ICD-10-CM | POA: Diagnosis not present

## 2023-06-05 ENCOUNTER — Encounter: Payer: Self-pay | Admitting: Physician Assistant

## 2023-06-05 ENCOUNTER — Ambulatory Visit: Payer: Medicare HMO | Attending: Physician Assistant | Admitting: Physician Assistant

## 2023-06-05 VITALS — BP 114/40 | HR 85 | Ht 61.0 in | Wt 113.0 lb

## 2023-06-05 DIAGNOSIS — R42 Dizziness and giddiness: Secondary | ICD-10-CM | POA: Diagnosis not present

## 2023-06-05 DIAGNOSIS — I493 Ventricular premature depolarization: Secondary | ICD-10-CM | POA: Diagnosis not present

## 2023-06-05 DIAGNOSIS — E785 Hyperlipidemia, unspecified: Secondary | ICD-10-CM

## 2023-06-05 DIAGNOSIS — I25118 Atherosclerotic heart disease of native coronary artery with other forms of angina pectoris: Secondary | ICD-10-CM | POA: Diagnosis not present

## 2023-06-05 MED ORDER — METOPROLOL SUCCINATE ER 25 MG PO TB24: 25.0000 mg | ORAL_TABLET | Freq: Every day | ORAL | 3 refills | Status: DC

## 2023-06-05 NOTE — Progress Notes (Signed)
lb Date of Birth:  September 12, 1940                 BSA:          1.496 m Patient Age:    81 years                  BP:           148/54 mmHg Patient Gender: F                         HR:           60 bpm. Exam Location:  [address]  Procedure: 2D Echo, Cardiac Doppler, Color Doppler and 3D Echo  Indications:    I05.9 mitral valve diseases  History:        Patient has prior history of Echocardiogram examinations, most recent 11/07/2021. CAD, Prior CABG; Risk Factors:Hypertension.  Sonographer:    Rachel Valentine RDCS Referring Phys: [phone] JONATHAN J BERRY  IMPRESSIONS   1. Left ventricular ejection fraction, by estimation, is 60 to 65%. The left ventricle has normal function. The left ventricle has no regional wall motion abnormalities. Left ventricular diastolic parameters are indeterminate. 2. Right ventricular systolic function is normal. The right ventricular size is normal. There is normal pulmonary artery systolic pressure. 3. The mitral valve is degenerative. Mild to moderate mitral valve regurgitation. No evidence of mitral stenosis. Moderate mitral annular calcification. 4. Tricuspid valve regurgitation is mild to moderate. 5. The aortic valve is tricuspid. There is moderate calcification of the aortic valve. There is moderate thickening of  the aortic valve. Aortic valve regurgitation is mild. 6. The inferior vena cava is dilated in size with >50% respiratory variability, suggesting right atrial pressure of 8 mmHg.  Comparison(s): No significant change from prior study.  Conclusion(s)/Recommendation(s): Otherwise normal echocardiogram, with minor abnormalities described in the report.  FINDINGS Left Ventricle: Left ventricular ejection fraction, by estimation, is 60 to 65%. The left ventricle has normal function. The left ventricle has no regional wall motion abnormalities. The left ventricular internal cavity size was normal in size. There is no left ventricular hypertrophy. Abnormal (paradoxical) septal motion consistent with post-operative status. Left ventricular diastolic parameters are indeterminate.  Right Ventricle: The right ventricular size is normal. No increase in right ventricular wall thickness. Right ventricular systolic function is normal. There is normal pulmonary artery systolic pressure. The tricuspid regurgitant velocity is 2.21 m/s, and with an assumed right atrial pressure of 8 mmHg, the estimated right ventricular systolic pressure is 27.5 mmHg.  Left Atrium: Left atrial size was normal in size.  Right Atrium: Right atrial size was normal in size.  Pericardium: There is no evidence of pericardial effusion.  Mitral Valve: The mitral valve is degenerative in appearance. There is mild thickening of the mitral valve leaflet(s). There is mild calcification of the mitral valve leaflet(s). Moderate mitral annular calcification. Mild to moderate mitral valve regurgitation. No evidence of mitral valve stenosis.  Tricuspid Valve: The tricuspid valve is normal in structure. Tricuspid valve regurgitation is mild to moderate. No evidence of tricuspid stenosis.  Aortic Valve: The aortic valve is tricuspid. There is moderate calcification of the aortic valve. There is moderate thickening of the aortic valve. Aortic  valve regurgitation is mild. Aortic regurgitation PHT measures 412 msec.  Pulmonic Valve: The pulmonic valve was not well visualized. Pulmonic valve regurgitation is trivial. No evidence of pulmonic stenosis.  Aorta: The aortic root, ascending aorta, aortic  Cardiology Office Note:  .   Date:  06/05/2023  ID:  Rachel Valentine, DOB May 31, 1941, MRN [id number] PCP: Shon Hale, MD  [facility] Providers Cardiologist:  Nanetta Batty, MD     History of Present Illness: .   Rachel Valentine is a 82 y.o. female with PMH of hyperlipidemia, palpitation and CAD.  She has never had a heart attack or stroke.  She was admitted at [facility] in May 2020 with unstable angina.  Enzyme was negative.  Subsequent cardiac catheterization on 01/04/2019 showed normal LV function, 95% proximal to mid left circumflex lesion treated with resolute Onyx 2.5 x 12 mm DES, 80% mid to distal left circumflex lesion treated with balloon angioplasty with 30% residual stenosis, 99% proximal RCA lesion followed by 80% mid RCA lesion, EF 55 to 65%.  Patient underwent staged PCI on 01/05/2019 with successful atherectomy and placement of 2 DES to mid to distal RCA.  Unfortunately she developed a in-stent restenosis of the previously placed RCA stent in October 2020, Dr. Gery Pray attempted to cross the lesion unsuccessfully.  Based on this, she ultimately underwent CABG x 1 with SVG to distal RCA by Dr. Laneta Simmers during the same hospitalization.  She later developed breast cancer and finished chemo and radiation therapy.  She also had gross hematuria and was seen by Dr. Annabell Howells of [facility].  In 2023, she developed symptomatic palpitation which began after her Prolia shot in February, this was one of the known side effect of the medication.  Echocardiogram obtained on 11/07/2021 showed normal EF, grade 2 DD, moderate MR and moderate AI.  Heart monitor showed frequent PVCs often in trigeminal pattern with occasional couplet and triplet as well as SVT associated with symptoms.  Her beta-blocker was increased which resulted in improvement of her symptoms.  She was last seen By Dr. Allyson Sabal in October 2023 at which time she was doing well.  Based on  telephone message, she contacted our office in August complaining of dizzy spell in the morning with nausea vomiting and a headache.  Her metoprolol succinate was cut back to 12.5 mg twice a day.  More recently, she went to [facility] on 05/23/2023 due to weakness and lightheadedness and palpitation.  Basic metabolic panel, CBC and serial troponin were all negative.  Chest x-ray and showed bilateral peribronchial wall thickening concerning for possible bronchitis, minimal bibasilar subsegmental atelectasis or scarring noted.  Metoprolol was further decreased to once a day.  Patient presents today for follow-up.  Since cutting back on metoprolol succinate from 12.5 mg twice a day to 12.5 mg daily, her dizziness has improved however palpitation has worsened.  She describes her palpitations as her heart turned every few seconds.  EKG today shows she is in trigeminy.  She is currently taking metoprolol succinate 12.5 mg every night.  I did ask her to increase metoprolol succinate to 25 mg every night.  I do not think she can tolerate higher dose of metoprolol succinate.  I recommended a 3-day ZIO monitor to quantify the PVC burden and refer her to EP service to consider alternatives to suppress her PVCs.  Otherwise she denies any exertional chest pain or shortness of breath.  She has no lower extremity edema, orthopnea or PND.  She can follow-up with Dr. Allyson Sabal in 6 months.  ROS:   She complains of palpitation and dizziness.  She denies any shortness of breath or chest discomfort.  Studies Reviewed: .  Cardiology Office Note:  .   Date:  06/05/2023  ID:  Rachel Valentine, DOB May 31, 1941, MRN [id number] PCP: Shon Hale, MD  [facility] Providers Cardiologist:  Nanetta Batty, MD     History of Present Illness: .   Rachel Valentine is a 82 y.o. female with PMH of hyperlipidemia, palpitation and CAD.  She has never had a heart attack or stroke.  She was admitted at [facility] in May 2020 with unstable angina.  Enzyme was negative.  Subsequent cardiac catheterization on 01/04/2019 showed normal LV function, 95% proximal to mid left circumflex lesion treated with resolute Onyx 2.5 x 12 mm DES, 80% mid to distal left circumflex lesion treated with balloon angioplasty with 30% residual stenosis, 99% proximal RCA lesion followed by 80% mid RCA lesion, EF 55 to 65%.  Patient underwent staged PCI on 01/05/2019 with successful atherectomy and placement of 2 DES to mid to distal RCA.  Unfortunately she developed a in-stent restenosis of the previously placed RCA stent in October 2020, Dr. Gery Pray attempted to cross the lesion unsuccessfully.  Based on this, she ultimately underwent CABG x 1 with SVG to distal RCA by Dr. Laneta Simmers during the same hospitalization.  She later developed breast cancer and finished chemo and radiation therapy.  She also had gross hematuria and was seen by Dr. Annabell Howells of [facility].  In 2023, she developed symptomatic palpitation which began after her Prolia shot in February, this was one of the known side effect of the medication.  Echocardiogram obtained on 11/07/2021 showed normal EF, grade 2 DD, moderate MR and moderate AI.  Heart monitor showed frequent PVCs often in trigeminal pattern with occasional couplet and triplet as well as SVT associated with symptoms.  Her beta-blocker was increased which resulted in improvement of her symptoms.  She was last seen By Dr. Allyson Sabal in October 2023 at which time she was doing well.  Based on  telephone message, she contacted our office in August complaining of dizzy spell in the morning with nausea vomiting and a headache.  Her metoprolol succinate was cut back to 12.5 mg twice a day.  More recently, she went to [facility] ED on 05/23/2023 due to weakness and lightheadedness and palpitation.  Basic metabolic panel, CBC and serial troponin were all negative.  Chest x-ray and showed bilateral peribronchial wall thickening concerning for possible bronchitis, minimal bibasilar subsegmental atelectasis or scarring noted.  Metoprolol was further decreased to once a day.  Patient presents today for follow-up.  Since cutting back on metoprolol succinate from 12.5 mg twice a day to 12.5 mg daily, her dizziness has improved however palpitation has worsened.  She describes her palpitations as her heart turned every few seconds.  EKG today shows she is in trigeminy.  She is currently taking metoprolol succinate 12.5 mg every night.  I did ask her to increase metoprolol succinate to 25 mg every night.  I do not think she can tolerate higher dose of metoprolol succinate.  I recommended a 3-day ZIO monitor to quantify the PVC burden and refer her to EP service to consider alternatives to suppress her PVCs.  Otherwise she denies any exertional chest pain or shortness of breath.  She has no lower extremity edema, orthopnea or PND.  She can follow-up with Dr. Allyson Sabal in 6 months.  ROS:   She complains of palpitation and dizziness.  She denies any shortness of breath or chest discomfort.  Studies Reviewed: .  Weight:       117.0 lb Date of Birth:  [birth year]           BSA:          1.52 m Patient Age:    78 years            BP:           119/62 mmHg Patient Gender: F                   HR:           87 bpm. Exam Location:  Inpatient  Transesophogeal exam was perform intraoperatively during surgical procedure. Patient was closely monitored under general anesthesia during the entirety of examination.  Indications:     CABG History:         CAD Risk Factors:Hypertension. Performing Phys: [id number] Alleen Borne Diagnosing Phys: Achille Rich MD  Complications: No known complications during this procedure. POST-OP IMPRESSIONS Overall, there were no significant changes from pre-bypass.  PRE-OP FINDINGS Left Ventricle: The left ventricle has normal systolic function, with an ejection fraction of 55-60%. The cavity size was normal. There is no increase in left ventricular wall thickness.  Right Ventricle: The right ventricle has normal systolic function. The cavity was normal. There is no increase in right ventricular wall thickness.  Left Atrium: Left atrial size was normal in size.  Right Atrium: Right atrial size was normal in size. Right atrial pressure is estimated at 10 mmHg.  Interatrial Septum: No atrial level shunt detected by color flow Doppler.  Pericardium: There is no evidence of pericardial effusion.  Mitral Valve: The mitral valve is normal in structure. Mitral valve regurgitation is mild by color flow Doppler.  Tricuspid Valve: The  tricuspid valve was normal in structure. Tricuspid valve regurgitation is trivial by color flow Doppler.  Aortic Valve: The aortic valve is normal in structure. Aortic valve regurgitation is trivial by color flow Doppler. There is no evidence of aortic valve stenosis.  Pulmonic Valve: The pulmonic valve was normal in structure No evidence of pumonic stenosis. Pulmonic valve regurgitation is trivial by color flow Doppler.   Aorta: The aortic root, ascending aorta and aortic arch are normal in size and structure.   Achille Rich MD Electronically signed by Achille Rich MD Signature Date/Time: 06/30/2019/11:48:06 AM    Final   MONITORS  LONG TERM MONITOR (3-14 DAYS) 12/13/2021  Narrative Patch Wear Time:  14 days and 0 hours (2023-03-21T06:20:03-399 to 2023-04-04T06:20:07-399)  Patient had a min HR of 51 bpm, max HR of 169 bpm, and avg HR of 71 bpm. Predominant underlying rhythm was Sinus Rhythm. 19 Supraventricular Tachycardia runs occurred, the run with the fastest interval lasting 11.2 secs with a max rate of 169 bpm, the longest lasting 13.9 secs with an avg rate of 120 bpm. Supraventricular Tachycardia was detected within +/- 45 seconds of symptomatic patient event(s). Isolated SVEs were rare (<1.0%), SVE Couplets were rare (<1.0%), and SVE Triplets were rare (<1.0%). Isolated VEs were occasional (1.7%, 24369), VE Couplets were rare (<1.0%, 2397), and VE Triplets were rare (<1.0%, 21). Ventricular Trigeminy was present.  1. SR/SB/ST 2. Freq PVCs , Bi/Trigem, couplets and triplets 3. Runs of SVT assoc with Sx 4. Needs ROV to discuss           Risk Assessment/Calculations:            Physical Exam:   VS:  BP (!) 114/40   Pulse 85   Ht  Cardiology Office Note:  .   Date:  06/05/2023  ID:  Rachel Valentine, DOB May 31, 1941, MRN [id number] PCP: Shon Hale, MD  [facility] Providers Cardiologist:  Nanetta Batty, MD     History of Present Illness: .   Rachel Valentine is a 82 y.o. female with PMH of hyperlipidemia, palpitation and CAD.  She has never had a heart attack or stroke.  She was admitted at [facility] in May 2020 with unstable angina.  Enzyme was negative.  Subsequent cardiac catheterization on 01/04/2019 showed normal LV function, 95% proximal to mid left circumflex lesion treated with resolute Onyx 2.5 x 12 mm DES, 80% mid to distal left circumflex lesion treated with balloon angioplasty with 30% residual stenosis, 99% proximal RCA lesion followed by 80% mid RCA lesion, EF 55 to 65%.  Patient underwent staged PCI on 01/05/2019 with successful atherectomy and placement of 2 DES to mid to distal RCA.  Unfortunately she developed a in-stent restenosis of the previously placed RCA stent in October 2020, Dr. Gery Pray attempted to cross the lesion unsuccessfully.  Based on this, she ultimately underwent CABG x 1 with SVG to distal RCA by Dr. Laneta Simmers during the same hospitalization.  She later developed breast cancer and finished chemo and radiation therapy.  She also had gross hematuria and was seen by Dr. Annabell Howells of [facility].  In 2023, she developed symptomatic palpitation which began after her Prolia shot in February, this was one of the known side effect of the medication.  Echocardiogram obtained on 11/07/2021 showed normal EF, grade 2 DD, moderate MR and moderate AI.  Heart monitor showed frequent PVCs often in trigeminal pattern with occasional couplet and triplet as well as SVT associated with symptoms.  Her beta-blocker was increased which resulted in improvement of her symptoms.  She was last seen By Dr. Allyson Sabal in October 2023 at which time she was doing well.  Based on  telephone message, she contacted our office in August complaining of dizzy spell in the morning with nausea vomiting and a headache.  Her metoprolol succinate was cut back to 12.5 mg twice a day.  More recently, she went to [facility] ED on 05/23/2023 due to weakness and lightheadedness and palpitation.  Basic metabolic panel, CBC and serial troponin were all negative.  Chest x-ray and showed bilateral peribronchial wall thickening concerning for possible bronchitis, minimal bibasilar subsegmental atelectasis or scarring noted.  Metoprolol was further decreased to once a day.  Patient presents today for follow-up.  Since cutting back on metoprolol succinate from 12.5 mg twice a day to 12.5 mg daily, her dizziness has improved however palpitation has worsened.  She describes her palpitations as her heart turned every few seconds.  EKG today shows she is in trigeminy.  She is currently taking metoprolol succinate 12.5 mg every night.  I did ask her to increase metoprolol succinate to 25 mg every night.  I do not think she can tolerate higher dose of metoprolol succinate.  I recommended a 3-day ZIO monitor to quantify the PVC burden and refer her to EP service to consider alternatives to suppress her PVCs.  Otherwise she denies any exertional chest pain or shortness of breath.  She has no lower extremity edema, orthopnea or PND.  She can follow-up with Dr. Allyson Sabal in 6 months.  ROS:   She complains of palpitation and dizziness.  She denies any shortness of breath or chest discomfort.  Studies Reviewed: .  Cardiology Office Note:  .   Date:  06/05/2023  ID:  Rachel Valentine, DOB May 31, 1941, MRN [id number] PCP: Shon Hale, MD  [facility] Providers Cardiologist:  Nanetta Batty, MD     History of Present Illness: .   Rachel Valentine is a 82 y.o. female with PMH of hyperlipidemia, palpitation and CAD.  She has never had a heart attack or stroke.  She was admitted at [facility] in May 2020 with unstable angina.  Enzyme was negative.  Subsequent cardiac catheterization on 01/04/2019 showed normal LV function, 95% proximal to mid left circumflex lesion treated with resolute Onyx 2.5 x 12 mm DES, 80% mid to distal left circumflex lesion treated with balloon angioplasty with 30% residual stenosis, 99% proximal RCA lesion followed by 80% mid RCA lesion, EF 55 to 65%.  Patient underwent staged PCI on 01/05/2019 with successful atherectomy and placement of 2 DES to mid to distal RCA.  Unfortunately she developed a in-stent restenosis of the previously placed RCA stent in October 2020, Dr. Gery Pray attempted to cross the lesion unsuccessfully.  Based on this, she ultimately underwent CABG x 1 with SVG to distal RCA by Dr. Laneta Simmers during the same hospitalization.  She later developed breast cancer and finished chemo and radiation therapy.  She also had gross hematuria and was seen by Dr. Annabell Howells of [facility].  In 2023, she developed symptomatic palpitation which began after her Prolia shot in February, this was one of the known side effect of the medication.  Echocardiogram obtained on 11/07/2021 showed normal EF, grade 2 DD, moderate MR and moderate AI.  Heart monitor showed frequent PVCs often in trigeminal pattern with occasional couplet and triplet as well as SVT associated with symptoms.  Her beta-blocker was increased which resulted in improvement of her symptoms.  She was last seen By Dr. Allyson Sabal in October 2023 at which time she was doing well.  Based on  telephone message, she contacted our office in August complaining of dizzy spell in the morning with nausea vomiting and a headache.  Her metoprolol succinate was cut back to 12.5 mg twice a day.  More recently, she went to [facility] on 05/23/2023 due to weakness and lightheadedness and palpitation.  Basic metabolic panel, CBC and serial troponin were all negative.  Chest x-ray and showed bilateral peribronchial wall thickening concerning for possible bronchitis, minimal bibasilar subsegmental atelectasis or scarring noted.  Metoprolol was further decreased to once a day.  Patient presents today for follow-up.  Since cutting back on metoprolol succinate from 12.5 mg twice a day to 12.5 mg daily, her dizziness has improved however palpitation has worsened.  She describes her palpitations as her heart turned every few seconds.  EKG today shows she is in trigeminy.  She is currently taking metoprolol succinate 12.5 mg every night.  I did ask her to increase metoprolol succinate to 25 mg every night.  I do not think she can tolerate higher dose of metoprolol succinate.  I recommended a 3-day ZIO monitor to quantify the PVC burden and refer her to EP service to consider alternatives to suppress her PVCs.  Otherwise she denies any exertional chest pain or shortness of breath.  She has no lower extremity edema, orthopnea or PND.  She can follow-up with Dr. Allyson Sabal in 6 months.  ROS:   She complains of palpitation and dizziness.  She denies any shortness of breath or chest discomfort.  Studies Reviewed: .  lb Date of Birth:  September 12, 1940                 BSA:          1.496 m Patient Age:    81 years                  BP:           148/54 mmHg Patient Gender: F                         HR:           60 bpm. Exam Location:  [address]  Procedure: 2D Echo, Cardiac Doppler, Color Doppler and 3D Echo  Indications:    I05.9 mitral valve diseases  History:        Patient has prior history of Echocardiogram examinations, most recent 11/07/2021. CAD, Prior CABG; Risk Factors:Hypertension.  Sonographer:    Rachel Valentine RDCS Referring Phys: [phone] JONATHAN J BERRY  IMPRESSIONS   1. Left ventricular ejection fraction, by estimation, is 60 to 65%. The left ventricle has normal function. The left ventricle has no regional wall motion abnormalities. Left ventricular diastolic parameters are indeterminate. 2. Right ventricular systolic function is normal. The right ventricular size is normal. There is normal pulmonary artery systolic pressure. 3. The mitral valve is degenerative. Mild to moderate mitral valve regurgitation. No evidence of mitral stenosis. Moderate mitral annular calcification. 4. Tricuspid valve regurgitation is mild to moderate. 5. The aortic valve is tricuspid. There is moderate calcification of the aortic valve. There is moderate thickening of  the aortic valve. Aortic valve regurgitation is mild. 6. The inferior vena cava is dilated in size with >50% respiratory variability, suggesting right atrial pressure of 8 mmHg.  Comparison(s): No significant change from prior study.  Conclusion(s)/Recommendation(s): Otherwise normal echocardiogram, with minor abnormalities described in the report.  FINDINGS Left Ventricle: Left ventricular ejection fraction, by estimation, is 60 to 65%. The left ventricle has normal function. The left ventricle has no regional wall motion abnormalities. The left ventricular internal cavity size was normal in size. There is no left ventricular hypertrophy. Abnormal (paradoxical) septal motion consistent with post-operative status. Left ventricular diastolic parameters are indeterminate.  Right Ventricle: The right ventricular size is normal. No increase in right ventricular wall thickness. Right ventricular systolic function is normal. There is normal pulmonary artery systolic pressure. The tricuspid regurgitant velocity is 2.21 m/s, and with an assumed right atrial pressure of 8 mmHg, the estimated right ventricular systolic pressure is 27.5 mmHg.  Left Atrium: Left atrial size was normal in size.  Right Atrium: Right atrial size was normal in size.  Pericardium: There is no evidence of pericardial effusion.  Mitral Valve: The mitral valve is degenerative in appearance. There is mild thickening of the mitral valve leaflet(s). There is mild calcification of the mitral valve leaflet(s). Moderate mitral annular calcification. Mild to moderate mitral valve regurgitation. No evidence of mitral valve stenosis.  Tricuspid Valve: The tricuspid valve is normal in structure. Tricuspid valve regurgitation is mild to moderate. No evidence of tricuspid stenosis.  Aortic Valve: The aortic valve is tricuspid. There is moderate calcification of the aortic valve. There is moderate thickening of the aortic valve. Aortic  valve regurgitation is mild. Aortic regurgitation PHT measures 412 msec.  Pulmonic Valve: The pulmonic valve was not well visualized. Pulmonic valve regurgitation is trivial. No evidence of pulmonic stenosis.  Aorta: The aortic root, ascending aorta, aortic

## 2023-06-05 NOTE — Patient Instructions (Addendum)
Medication Instructions:  INCREASE METOPROLOL SUCCINATE TO 25 MG DAILY AT NIGHT.  *If you need a refill on your cardiac medications before your next appointment, please call your pharmacy*   Lab Work: NO LABS If you have labs (blood work) drawn today and your tests are completely normal, you will receive your results only by: MyChart Message (if you have MyChart) OR A paper copy in the mail If you have any lab test that is abnormal or we need to change your treatment, we will call you to review the results.   Testing/Procedures: PROVIDER IS REQUESTING YOU WEAR 3 DAY ZIO MONITOR. PLEASE READ GIVEN INSTRUCTIONS BELOW.   Follow-Up: At [facility], you and your health needs are our priority.  As part of our continuing mission to provide you with exceptional heart care, we have created designated Provider Care Teams.  These Care Teams include your primary Cardiologist (physician) and Advanced Practice Providers (APPs -  Physician Assistants and Nurse Practitioners) who all work together to provide you with the care you need, when you need it.    Your next appointment:   6 month(s)  Provider:   Nanetta Batty, MD   Other Instructions REFERRAL TO ELECTROPHYSIOLOGY FOR FREQUENT PVCS.  ZIO XT- Long Term Monitor Instructions  Your physician has requested you wear a ZIO patch monitor for 14 days.  This is a single patch monitor. [employer] supplies one patch monitor per enrollment. Additional stickers are not available. Please do not apply patch if you will be having a Nuclear Stress Test,  Echocardiogram, Cardiac CT, MRI, or Chest Xray during the period you would be wearing the  monitor. The patch cannot be worn during these tests. You cannot remove and re-apply the  ZIO XT patch monitor.  Your ZIO patch monitor will be mailed 3 day USPS to your address on file. It may take 3-5 days  to receive your monitor after you have been enrolled.  Once you have received your monitor,  please review the enclosed instructions. Your monitor  has already been registered assigning a specific monitor serial # to you.  Billing and Patient Assistance Program Information  We have supplied [employer] with any of your insurance information on file for billing purposes. [employer] offers a sliding scale Patient Assistance Program for patients that do not have  insurance, or whose insurance does not completely cover the cost of the ZIO monitor.  You must apply for the Patient Assistance Program to qualify for this discounted rate.  To apply, please call [employer] at [phone], select option 4, select option 2, ask to apply for  Patient Assistance Program. Meredeth Ide will ask your household income, and how many people  are in your household. They will quote your out-of-pocket cost based on that information.  [employer] will also be able to set up a 64-month, interest-free payment plan if needed.  Applying the monitor   Shave hair from upper left chest.  Hold abrader disc by orange tab. Rub abrader in 40 strokes over the upper left chest as  indicated in your monitor instructions.  Clean area with 4 enclosed alcohol pads. Let dry.  Apply patch as indicated in monitor instructions. Patch will be placed under collarbone on left  side of chest with arrow pointing upward.  Rub patch adhesive wings for 2 minutes. Remove white label marked "1". Remove the white  label marked "2". Rub patch adhesive wings for 2 additional minutes.  While looking in a mirror, press and release button in  center of patch. A small green light will  flash 3-4 times. This will be your only indicator that the monitor has been turned on.  Do not shower for the first 24 hours. You may shower after the first 24 hours.  Press the button if you feel a symptom. You will hear a small click. Record Date, Time and  Symptom in the Patient Logbook.  When you are ready to remove the patch, follow instructions on the last 2 pages of  Patient  Logbook. Stick patch monitor onto the last page of Patient Logbook.  Place Patient Logbook in the blue and white box. Use locking tab on box and tape box closed  securely. The blue and white box has prepaid postage on it. Please place it in the mailbox as  soon as possible. Your physician should have your test results approximately 7 days after the  monitor has been mailed back to [facility].  Call [facility] Customer Care at [phone] if you have questions regarding  your ZIO XT patch monitor. Call them immediately if you see an orange light blinking on your  monitor.  If your monitor falls off in less than 4 days, contact our Monitor department at [phone].  If your monitor becomes loose or falls off after 4 days call [employer] at [phone] for  suggestions on securing your monitor

## 2023-06-09 ENCOUNTER — Encounter: Payer: Self-pay | Admitting: Cardiovascular Disease

## 2023-06-17 ENCOUNTER — Encounter: Payer: Self-pay | Admitting: Cardiovascular Disease

## 2023-06-17 DIAGNOSIS — Z0100 Encounter for examination of eyes and vision without abnormal findings: Secondary | ICD-10-CM | POA: Diagnosis not present

## 2023-06-18 ENCOUNTER — Encounter: Payer: Self-pay | Admitting: Cardiovascular Disease

## 2023-06-19 ENCOUNTER — Ambulatory Visit: Payer: Medicare HMO | Attending: Physician Assistant

## 2023-06-19 NOTE — Telephone Encounter (Signed)
Wells, Kara Pacer, [place], RN2 hours ago (10:53 AM)    The patients monitor order was linked to her office visit with Wynema Birch by Corey Harold, CMA.  This will happen if an order is entered as normal status instead of future status.  It therefore, never dropped to the [facility] monitor work que to be processed. I will correct the order and have it processed today.  I will contact Murriel Hopper at [facility] to see if we can have shipping expedited.  Thanks, [employer]

## 2023-06-19 NOTE — Progress Notes (Unsigned)
Enrolled for [employer] to mail a ZIO XT long term holter monitor to the patients address on file. Expedited shipping requested.  Order was entered with a status of normal instead of future by Corey Harold, CMA.  This linked the order to her 06/05/23 office visit with Azalee Course, the order therefore never was forwarded to the [facility] monitor work que.  On 06/19/23 the order was corrected and processed.  Murriel Hopper at [facility] notified to please expedite shipping due to this error.  Dr. Allyson Sabal to read.

## 2023-06-28 DIAGNOSIS — I493 Ventricular premature depolarization: Secondary | ICD-10-CM

## 2023-07-09 DIAGNOSIS — I493 Ventricular premature depolarization: Secondary | ICD-10-CM | POA: Diagnosis not present

## 2023-07-10 ENCOUNTER — Encounter: Payer: Self-pay | Admitting: Cardiovascular Disease

## 2023-07-15 NOTE — Progress Notes (Unsigned)
Electrophysiology Office Note:    Date:  07/16/2023   ID:  Rachel Valentine, DOB 1941/03/26, MRN [id number]  [facility] Cardiologist:  Nanetta Batty, MD  [facility] Electrophysiologist:  Lanier Prude, MD   Referring MD: Shon Hale, *   Chief Complaint: PVC  History of Present Illness:      Discussed the use of AI scribe software for clinical note transcription with the patient, who gave verbal consent to proceed.  History of Present Illness   Rachel Valentine, an 82 year old woman with a history of hyperlipidemia, coronary artery disease, and breast cancer, presents for evaluation of PVCs. She underwent bypass surgery in May 2020 with a single vessel vein graft to the distal RCA. In August, she reported dizziness and palpitations. Her metoprolol dosage was decreased, which improved her dizziness but exacerbated her palpitations. A Zio patch was ordered to assess the PVC burden, which showed a PVC burden of 11% with one dominant morphology of PVCs predominantly occurring during the daytime hours. The patient reports feeling the PVCs, sometimes even up into her throat, and is concerned about whether she is getting enough blood to her head. She experiences these sensations most of the time, but they do not seem to worsen with activity. The patient is active, walking three miles a day for thirteen years, and is frustrated by her current symptoms.               Their past medical, social and family history was reveiwed.   ROS:   Please see the history of present illness.    All other systems reviewed and are negative.  EKGs/Labs/Other Studies Reviewed:    The following studies were reviewed today:  06/05/2023 ECG showed sinus with trigeminal PVCs. PVC monomorphic. Inferior axis with QRS transition in V3 before sinus transition.    12/10/2022 Echo EF 60 RV normal        Physical Exam:    VS:  BP (!) 150/70 (BP Location: Right Arm, Patient  Position: Sitting, Cuff Size: Normal)   Pulse 64   Ht 5\' 2"  (1.575 m)   Wt 111 lb 6 oz (50.5 kg)   LMP  (LMP Unknown)   SpO2 94%   BMI 20.37 kg/m     Wt Readings from Last 3 Encounters:  07/16/23 111 lb 6 oz (50.5 kg)  06/05/23 113 lb (51.3 kg)  09/24/22 112 lb 5 oz (50.9 kg)     Physical Exam   GENERAL: No distress, appears younger than stated age, no increased work of breathing. CHEST: Lung sounds clear bilaterally. CARDIOVASCULAR: Heart rhythm regular.           ASSESSMENT AND PLAN:    1. Frequent PVCs   2. Coronary artery disease of native artery of native heart with stable angina pectoris (HCC)   3. S/P CABG x 1       Assessment and Plan    Premature Ventricular Contractions (PVCs) Persistent and bothersome PVCs with a burden of 11% on recent Zio patch. No exacerbation with activity. Previous reduction in Metoprolol dose improved dizziness but worsened palpitations. -Start Magnesium Oxide 400mg  daily. -Start Mexiletine 150mg  twice daily. -Continue Metoprolol 25mg  at night. -Follow up in 8 weeks with Rosalita Chessman.  Coronary Artery Disease History of bypass surgery in May 2020 with a single vessel vein graft to the distal RCA. No current symptoms suggestive of angina. -Continue current management.  Hyperlipidemia No specific issues discussed in the conversation. -Continue current management.  Breast Cancer  History of breast cancer with prior chemo and radiation. No current issues discussed in the conversation. -Continue current management.               Signed, Rossie Muskrat. Lalla Brothers, MD, [facility], [facility] 07/16/2023 10:20 AM    Electrophysiology [facility]

## 2023-07-16 ENCOUNTER — Ambulatory Visit: Payer: Medicare HMO | Attending: Cardiology | Admitting: Cardiology

## 2023-07-16 ENCOUNTER — Encounter: Payer: Self-pay | Admitting: Cardiology

## 2023-07-16 VITALS — BP 150/70 | HR 64 | Ht 62.0 in | Wt 111.4 lb

## 2023-07-16 DIAGNOSIS — I493 Ventricular premature depolarization: Secondary | ICD-10-CM

## 2023-07-16 DIAGNOSIS — I25118 Atherosclerotic heart disease of native coronary artery with other forms of angina pectoris: Secondary | ICD-10-CM

## 2023-07-16 DIAGNOSIS — Z951 Presence of aortocoronary bypass graft: Secondary | ICD-10-CM

## 2023-07-16 MED ORDER — MEXILETINE HCL 150 MG PO CAPS: 150.0000 mg | ORAL_CAPSULE | Freq: Two times a day (BID) | ORAL | 3 refills | Status: DC

## 2023-07-16 MED ORDER — MAGNESIUM OXIDE 400 MG PO CAPS: 400.0000 mg | ORAL_CAPSULE | Freq: Every day | ORAL | 3 refills | Status: DC

## 2023-07-16 NOTE — Patient Instructions (Signed)
Medication Instructions:  Your physician has recommended you make the following change in your medication:  1) START magnesium oxide 400 mg once daily  2) START mexiletine 150 mg twice daily   *If you need a refill on your cardiac medications before your next appointment, please call your pharmacy*  Follow-Up: At [facility], you and your health needs are our priority.  As part of our continuing mission to provide you with exceptional heart care, we have created designated Provider Care Teams.  These Care Teams include your primary Cardiologist (physician) and Advanced Practice Providers (APPs -  Physician Assistants and Nurse Practitioners) who all work together to provide you with the care you need, when you need it.  Your next appointment:   8 weeks  Provider:   Sherie Don, NP

## 2023-07-22 ENCOUNTER — Encounter: Payer: Self-pay | Admitting: Cardiology

## 2023-07-23 ENCOUNTER — Encounter: Payer: Self-pay | Admitting: Hematology and Oncology

## 2023-07-23 NOTE — Telephone Encounter (Signed)
Telephone call  

## 2023-07-24 ENCOUNTER — Encounter: Payer: Self-pay | Admitting: Hematology and Oncology

## 2023-07-24 NOTE — Telephone Encounter (Signed)
Telephone call  

## 2023-08-23 ENCOUNTER — Other Ambulatory Visit: Payer: Self-pay | Admitting: Cardiovascular Disease

## 2023-08-23 DIAGNOSIS — R42 Dizziness and giddiness: Secondary | ICD-10-CM

## 2023-09-05 ENCOUNTER — Other Ambulatory Visit: Payer: Self-pay | Admitting: Hematology and Oncology

## 2023-09-10 ENCOUNTER — Telehealth: Payer: Self-pay | Admitting: Physician Assistant

## 2023-09-10 NOTE — Telephone Encounter (Signed)
   Patient Name: Rachel Valentine [facility]  DOB: 1941-07-10 MRN: [id number]  Primary Cardiologist: Dorn Lesches, MD  I am helping assist in preop today. Received message from operator that [facility] was on the line wanting to know if they can continue her dental cleaning. Per their report, the patient is there for routine dental cleaning, no procedures otherwise, and otherwise there are no patient concerns or complaints. Chart reviewed as part of pre-operative protocol coverage. She has history of CAD s/p CABG 2020, PVCs, HTN, breast CA, HLD. Last seen 07/16/23 with plan for start of mexiletine for PVCs; otherwise patient reported to be active walking 3 miles a day. She has upcoming follow-up with EP. She has no history of infective endocarditis or valvular repair per chart. Dental cleanings are considered low risk procedures per guidelines and generally do not require any specific cardiac clearance. It is also generally accepted that for dental cleanings, there is no need to interrupt blood thinner therapy.  SBE prophylaxis is not required for the patient from a cardiac standpoint.  I will route this recommendation to the requesting party via Epic fax function [phone] and remove from pre-op pool.  Please call with questions.  Zaylee Cornia N Willisha Sligar, PA-C 09/10/2023, 9:18 AM

## 2023-09-16 NOTE — Progress Notes (Signed)
Electrophysiology Clinic Note    Date:  09/19/2023  Patient ID:  Rukia, Gunderson [birth year], MRN [id number] PCP:  Shon Hale, MD  Cardiologist:  Nanetta Batty, MD Electrophysiologist: Lanier Prude, MD    Discussed the use of AI scribe software for clinical note transcription with the patient, who gave verbal consent to proceed.   Patient Profile    Chief Complaint: PVC follow-up  History of Present Illness: Monteria Hasselman is a 83 y.o. female with PMH notable for PVCs, CAD s/p CABG x 1, HLD, Breast Ca; seen today for Lanier Prude, MD for routine electrophysiology followup.  She last saw Dr. Lalla Brothers 07/2023 for initial consult of PVCs. Previous zio with 11% PVC burden without activity limitations. Metop dose helped palps but caused dizziness. He started PO mag and mexiletine. She did not notice any improvement with mexiletine, so stopped it several weeks ago.   On follow-up today, she continues to be quite symptomatic from her PVCs, has significantly less energy. She states "horizontal is her new favorite position", which is different for her. She continues to take toprol nightly. She is nervous about amiodarone side effects.    AAD History: Mexiletine - started 07/2023, ineffective    ROS:  Please see the history of present illness. All other systems are reviewed and otherwise negative.    Physical Exam    VS:  BP (!) 142/78 (BP Location: Left Arm, Patient Position: Sitting, Cuff Size: Normal)   Pulse 78   Ht 5\' 1"  (1.549 m)   Wt 113 lb (51.3 kg)   LMP  (LMP Unknown)   SpO2 99%   BMI 21.35 kg/m  BMI: Body mass index is 21.35 kg/m.  Wt Readings from Last 3 Encounters:  09/19/23 113 lb (51.3 kg)  07/16/23 111 lb 6 oz (50.5 kg)  06/05/23 113 lb (51.3 kg)     GEN- The patient is well appearing, alert and oriented x 3 today.   Lungs- Clear to ausculation bilaterally, normal work of breathing.  Heart- Regular rate with  ectopy and rhythm, no murmurs, rubs or gallops Extremities- No peripheral edema, warm, dry    Studies Reviewed   Previous EP, cardiology notes.    EKG is ordered. Personal review of EKG from today shows:    EKG Interpretation Date/Time:  Friday September 19 2023 10:18:30 EST Ventricular Rate:  78 PR Interval:  146 QRS Duration:  78 QT Interval:  406 QTC Calculation: 462 R Axis:   17  Text Interpretation: Sinus rhythm with frequent Premature ventricular complexes Confirmed by Sherie Don [phone]) on 09/19/2023 10:19:45 AM    Long term monitor, 06/19/2023 Patient had a min HR of 50 bpm, max HR of 152 bpm, and avg HR of 68 bpm. Predominant underlying rhythm was Sinus Rhythm. 5 Supraventricular Tachycardia runs occurred, the run with the fastest interval lasting 4 beats with a max rate of 152 bpm, the  longest lasting 7 beats with an avg rate of 102 bpm. Isolated SVEs were rare (<1.0%), SVE Couplets were rare (<1.0%), and SVE Triplets were rare (<1.0%). Isolated VEs were frequent (11.0%, 31045), VE Couplets were rare (<1.0%, 269), and VE Triplets were  rare (<1.0%, 9). Ventricular Bigeminy and Trigeminy were present.    SR/SB/ST Short runs of SVT Occasional PACs/ Freq PVSs (11%burden) ROV to discuss  TTE, 12/10/2022  1. Left ventricular ejection fraction, by estimation, is 60 to 65%. The left ventricle has normal function. The left ventricle has  no regional wall motion abnormalities. Left ventricular diastolic parameters are indeterminate.   2. Right ventricular systolic function is normal. The right ventricular size is normal. There is normal pulmonary artery systolic pressure.   3. The mitral valve is degenerative. Mild to moderate mitral valve regurgitation. No evidence of mitral stenosis. Moderate mitral annular calcification.   4. Tricuspid valve regurgitation is mild to moderate.   5. The aortic valve is tricuspid. There is moderate calcification of the aortic valve. There is  moderate thickening of the aortic valve. Aortic valve regurgitation is mild.   6. The inferior vena cava is dilated in size with >50% respiratory variability, suggesting right atrial pressure of 8 mmHg.   Comparison(s): No significant change from prior study.   Conclusion(s)/Recommendation(s): Otherwise normal echocardiogram, with minor abnormalities described in the report.     Assessment and Plan     #) PVC Recent Echo with preserved LVEF, though PVC burden appears higher now Recently started mexiletine, but stopped d/t ineffective Continues to be quite symptomatic from PVCs with reduced energy Patient anxious about starting amiodarone Start renexa 500mg  BID. I've asked patient to alert office in 2-3 weeks if ineffective so can increase to 1000mg  BID Consider amiodarone if renexa ineffective Continue 25mg  toprol nightly Update TTE  #) CAD s/p CABG x 1 No symptoms suggestive of angina at this time       Current medicines are reviewed at length with the patient today.   The patient has concerns regarding her medicines.  The following changes were made today:   START 500mg  renexa BID  Labs/ tests ordered today include:  Orders Placed This Encounter  Procedures   EKG 12-Lead   ECHOCARDIOGRAM COMPLETE     Disposition: Follow up with Dr. Lalla Brothers or EP APP  2 months    Signed, Sherie Don, NP  09/19/23  12:29 PM  Electrophysiology [facility]

## 2023-09-18 ENCOUNTER — Other Ambulatory Visit: Payer: Self-pay | Admitting: Cardiovascular Disease

## 2023-09-18 DIAGNOSIS — K2100 Gastro-esophageal reflux disease with esophagitis, without bleeding: Secondary | ICD-10-CM

## 2023-09-19 ENCOUNTER — Encounter: Payer: Self-pay | Admitting: Cardiology

## 2023-09-19 ENCOUNTER — Ambulatory Visit: Payer: Medicare HMO | Attending: Cardiology | Admitting: Cardiology

## 2023-09-19 VITALS — BP 142/78 | HR 78 | Ht 61.0 in | Wt 113.0 lb

## 2023-09-19 DIAGNOSIS — I493 Ventricular premature depolarization: Secondary | ICD-10-CM | POA: Diagnosis not present

## 2023-09-19 DIAGNOSIS — Z951 Presence of aortocoronary bypass graft: Secondary | ICD-10-CM | POA: Diagnosis not present

## 2023-09-19 DIAGNOSIS — I25118 Atherosclerotic heart disease of native coronary artery with other forms of angina pectoris: Secondary | ICD-10-CM

## 2023-09-19 MED ORDER — RANOLAZINE ER 500 MG PO TB12: 500.0000 mg | ORAL_TABLET | Freq: Two times a day (BID) | ORAL | 6 refills | Status: DC

## 2023-09-19 NOTE — Patient Instructions (Addendum)
Medication Instructions:  - Your physician has recommended you make the following change in your medication:   1) START Ranexa (Ranolazine) 500 mg: - take 1 tablet by mouth TWICE daily (or every 12 hours)  Call the office [phone] or send a MyChart message in 2 weeks with how you are feeling on the new medication  *If you need a refill on your cardiac medications before your next appointment, please call your pharmacy*   Lab Work: - none ordered  If you have labs (blood work) drawn today and your tests are completely normal, you will receive your results only by: MyChart Message (if you have MyChart) OR A paper copy in the mail If you have any lab test that is abnormal or we need to change your treatment, we will call you to review the results.   Testing/Procedures:  1) Echocardiogram: -Your physician has requested that you have an echocardiogram. Echocardiography is a painless test that uses sound waves to create images of your heart. It provides your doctor with information about the size and shape of your heart and how well your heart's chambers and valves are working. This procedure takes approximately one hour. There are no restrictions for this procedure. Please do NOT wear cologne, perfume, aftershave, or lotions (deodorant is allowed). Please arrive 15 minutes prior to your appointment time.  There is a possibility that an IV may need to be started during your test to inject an image enhancing agent. This is done to obtain more optimal pictures of your heart. Therefore we ask that you do at least drink some water prior to coming in to hydrate your veins.    Please note: We ask at that you not bring children with you during ultrasound (echo/ vascular) testing. Due to room size and safety concerns, children are not allowed in the ultrasound rooms during exams. Our front office staff cannot provide observation of children in our lobby area while testing is being conducted. An  adult accompanying a patient to their appointment will only be allowed in the ultrasound room at the discretion of the ultrasound technician under special circumstances. We apologize for any inconvenience.    Follow-Up: At [facility], you and your health needs are our priority.  As part of our continuing mission to provide you with exceptional heart care, we have created designated Provider Care Teams.  These Care Teams include your primary Cardiologist (physician) and Advanced Practice Providers (APPs -  Physician Assistants and Nurse Practitioners) who all work together to provide you with the care you need, when you need it.  We recommend signing up for the patient portal called "MyChart".  Sign up information is provided on this After Visit Summary.  MyChart is used to connect with patients for Virtual Visits (Telemedicine).  Patients are able to view lab/test results, encounter notes, upcoming appointments, etc.  Non-urgent messages can be sent to your provider as well.   To learn more about what you can do with MyChart, go to [email or web address].    Your next appointment:   2 month(s)  Provider:   Sherie Don, NP    Other Instructions  Ranolazine Extended-Release Tablets What is this medication? RANOLAZINE (ra NOE la zeen) treats chronic chest pain (angina). It works by reducing how often you get chest pain and improves your ability to exercise. Do not use it to treat sudden chest pain. This medicine may be used for other purposes; ask your health care provider or pharmacist if you have  questions. COMMON BRAND NAME(S): Ranexa What should I tell my care team before I take this medication? They need to know if you have any of these conditions: Heart disease Irregular heartbeat or rhythm Kidney disease Liver disease Low levels of potassium or magnesium in the blood An unusual or allergic reaction to ranolazine, other medications, foods, dyes, or  preservatives Pregnant or trying to get pregnant Breast-feeding How should I use this medication? Take this medication by mouth with water. Take it as directed on the prescription label at the same time every day. Do not cut, crush, or chew this medication. Swallow the tablets whole. You may take it with or without food. If it upsets your stomach, take it with food. Do not take this medication with grapefruit juice. Keep taking it unless your care team tells you to stop. Talk to your care team about the use of this medication in children. Special care may be needed. Overdosage: If you think you have taken too much of this medicine contact a poison control center or emergency room at once. NOTE: This medicine is only for you. Do not share this medicine with others. What if I miss a dose? If you miss a dose, take it as soon as you can. If it is almost time for your next dose, take only that dose. Do not take double or extra doses. What may interact with this medication? Do not take this medication with any of the following: Adagrasib Cerivastatin Certain antibiotics, such as clarithromycin, rifabutin, rifampin, rifapentine Certain antivirals for hepatitis or HIV Certain medications for fungal infections, such as itraconazole, ketoconazole, posaconazole, voriconazole Certain medications for irregular heartbeat, such as dronedarone Certain medications for seizures, such as carbamazepine, fosphenytoin, oxcarbazepine, phenobarbital, phenytoin Certain medications used for cancer treatment Cisapride Conivaptan Nefazodone Pimozide St. John's wort Thioridazine This medication may also interact with the following: Certain medications for cholesterol, such as atorvastatin, lovastatin, simvastatin Certain medications for depression, anxiety, or mental health conditions Cyclosporine Digoxin Diltiazem Dofetilide Eplerenone Ergot alkaloids, such as dihydroergotamine, ergonovine, ergotamine,  methylergonovine Erythromycin Fluconazole Grapefruit or grapefruit juice Metformin Other medications that cause heart rhythm changes Sirolimus Tacrolimus Verapamil This list may not describe all possible interactions. Give your health care provider a list of all the medicines, herbs, non-prescription drugs, or dietary supplements you use. Also tell them if you smoke, drink alcohol, or use illegal drugs. Some items may interact with your medicine. What should I watch for while using this medication? Visit your care team for regular checks on your progress. Tell your care team if your symptoms do not start to get better or if they get worse. This medication will not relieve an acute attack of angina or chest pain. This medication may affect your coordination, reaction time, or judgment. Do not drive or operate machinery until you know how this medication affects you. Sit up or stand slowly to reduce the risk of dizzy or fainting spells. Drinking alcohol with this medication can increase the risk of these side effects. If you are going to need surgery or other procedure, tell your care team that you are using this medication. What side effects may I notice from receiving this medication? Side effects that you should report to your care team as soon as possible: Allergic reactions--skin rash, itching, hives, swelling of the face, lips, tongue, or throat Heart rhythm changes--fast or irregular heartbeat, dizziness, feeling faint or lightheaded, chest pain, trouble breathing Side effects that usually do not require medical attention (report to your care  team if they continue or are bothersome): Constipation Dizziness Headache Nausea This list may not describe all possible side effects. Call your doctor for medical advice about side effects. You may report side effects to FDA at 1-800-FDA-1088. Where should I keep my medication? Keep out of the reach of children and pets. Store at room temperature  at 25 degrees C (77 degrees F). Get rid of any unused medication after the expiration date. To get rid of medications that are no longer needed or have expired: Take the medication to a medication take-back program. Check with your pharmacy or law enforcement to find a location. If you cannot return the medication, check the label or package insert to see if the medication should be thrown out in the garbage or flushed down the toilet. If you are not sure, ask your care team. If it is safe to put it in the trash, empty the medication out of the container. Mix the medication with cat litter, dirt, coffee grounds, or other unwanted substance. Seal the mixture in a bag or container. Put it in the trash. NOTE: This sheet is a summary. It may not cover all possible information. If you have questions about this medicine, talk to your doctor, pharmacist, or health care provider.  2024 Elsevier/Gold Standard (2021-09-28 00:00:00)   Echocardiogram An echocardiogram is a test that uses sound waves to make images of your heart. This way of making images is often called ultrasound. The images from this test can help find out many things about your heart, including: The size and shape of your heart. The strength of your heart muscle and how well it's working. The size, thickness, and movement of your heart's walls. How your heart valves are working. Problems such as: A tumor or a growth from an infection around the heart valves. Areas of heart muscle that aren't working well because of poor blood flow or injury from a heart attack. An aneurysm. This is a weak or damaged part of an artery wall. An artery is a blood vessel. Tell a health care provider about: Any allergies you have. All medicines you're taking, including vitamins, herbs, eye drops, creams, and over-the-counter medicines. Any bleeding problems you have. Any surgeries you've had. Any medical problems you have. Whether you're pregnant or may  be pregnant. What are the risks? Your health care provider will talk with you about risks. These may include an allergic reaction to IV dye that may be used during the test. What happens before the test? You don't need to do anything to get ready for this test. You may eat and drink normally. What happens during the test?  You'll take off your clothes from the waist up and put on a hospital gown. Sticky patches called electrodes may be placed on your chest. These will be connected to a machine that monitors your heart rate and rhythm. You'll lie down on a table for the exam. A wand covered in gel will be moved over your chest. Sound waves from the wand will go to your heart and bounce back--or "echo" back. The sound waves will go to a computer that uses them to make images of your heart. The images can be viewed on a monitor. The images will also be recorded on the computer so your provider can look at them later. You may be asked to change positions or hold your breath for a short time. This makes it easier to get different views or better views of your heart.  In some cases, you may be given a dye through an IV. The IV is put into one of your veins. This dye can make the areas of your heart easier to see. The procedure may vary among providers and hospitals. What can I expect after the test? You may return to your normal diet, activities, and medicines unless your provider tells you not to. If an IV was placed for the test, it will be removed. It's up to you to get the results of your test. Ask your provider, or the department that's doing the test, when your results will be ready. This information is not intended to replace advice given to you by your health care provider. Make sure you discuss any questions you have with your health care provider. Document Revised: 10/18/2022 Document Reviewed: 10/18/2022 Elsevier Patient Education  2024 [employer].

## 2023-09-21 ENCOUNTER — Encounter: Payer: Self-pay | Admitting: Cardiology

## 2023-09-25 ENCOUNTER — Inpatient Hospital Stay: Payer: Medicare HMO | Attending: Hematology and Oncology | Admitting: Hematology and Oncology

## 2023-09-25 VITALS — BP 138/57 | HR 68 | Temp 97.6°F | Resp 18 | Ht 61.0 in | Wt 112.2 lb

## 2023-09-25 DIAGNOSIS — C50512 Malignant neoplasm of lower-outer quadrant of left female breast: Secondary | ICD-10-CM | POA: Insufficient documentation

## 2023-09-25 DIAGNOSIS — Z780 Asymptomatic menopausal state: Secondary | ICD-10-CM

## 2023-09-25 DIAGNOSIS — Z923 Personal history of irradiation: Secondary | ICD-10-CM | POA: Diagnosis not present

## 2023-09-25 DIAGNOSIS — Z79899 Other long term (current) drug therapy: Secondary | ICD-10-CM | POA: Diagnosis not present

## 2023-09-25 DIAGNOSIS — Z170 Estrogen receptor positive status [ER+]: Secondary | ICD-10-CM | POA: Insufficient documentation

## 2023-09-25 DIAGNOSIS — Z79811 Long term (current) use of aromatase inhibitors: Secondary | ICD-10-CM | POA: Diagnosis not present

## 2023-09-25 NOTE — Assessment & Plan Note (Signed)
01/18/2020:Palpable left breast lump with calcifications at 5 o'clock position measuring 3.3 cm.  Biopsy revealed grade 3 IDC ER 95%, PR 0%, HER-2 3+ positive, Ki-67 50%, axilla negative, T2N0 stage IIa clinical stage Bilateral implants removed 2005   Treatment plan: 1.  Neo- adjuvant chemotherapy with Taxol Herceptin  2.  Breast conserving surgery with sentinel lymph node biopsy 3.  Adjuvant radiation therapy completed 08/21/2020 4.  Adjuvant antiestrogen therapy with anastrozole 1 mg daily x5 years   Breast MRI: 2.3 cm mass, indeterminate 8 mm mass (Biopsy: ADH), no LN --------------------------------------------------------------------------------------------------------------------------------------------------------------- 06/15/20: Left lumpectomy ([facility]): no residual carcinoma, 4 left axillary lymph nodes negative for carcinoma   Plan: 1. Anti-HER-2 therapy  11/16/2020 (stopped early due to fatigue) 2. adjuvant radiation therapy completed 08/21/2020 3.  Adjuvant antiestrogen therapy with anastrozole 1 mg daily x5 years  started January 2022) --------------------------------------------------------------------------------------------------------------------   Anastrozole toxicities : Denies any adverse effects to anastrozole.  Denies any hot flashes or myalgias. Osteoporosis: In 2018 at bone density was T score -2.6:  Prolia injections every 6 months.     Breast cancer surveillance: 1.  Breast exam 09/25/2023: Palpable nodule in the left breast 5 o'clock position 2. mammogram 05/29/2023: Benign breast density category C Biopsy 10/09/2022: Benign Bone density 09/18/2021: T score -2.4: Severe osteopenia but improved from 2018 (was -2.6)   She plans to go to [place] at the end of February 2024.   If it is benign, return to clinic in 1 year for follow-up

## 2023-09-25 NOTE — Progress Notes (Signed)
Patient Care Team: Shon Hale, MD as PCP - General (Family Medicine) Runell Gess, MD as PCP - Cardiology (Cardiology) Lanier Prude, MD as PCP - Electrophysiology (Cardiology) Harriette Bouillon, MD as Surgeon (General Surgery) Dorothy Puffer, MD as Consulting Physician (Radiation Oncology) Serena Croissant, MD as Consulting Physician (Hematology and Oncology)  DIAGNOSIS:  Encounter Diagnoses  Name Primary?   Malignant neoplasm of lower-outer quadrant of left breast of female, estrogen receptor positive (HCC) Yes   Post-menopausal     SUMMARY OF ONCOLOGIC HISTORY: Oncology History  Malignant neoplasm of lower-outer quadrant of left breast of female, estrogen receptor positive (HCC)  01/18/2020 Initial Diagnosis   Palpable left breast lump with calcifications at 5 o'clock position measuring 3.3 cm.  Biopsy revealed grade 3 IDC ER 95%, PR 0%, HER-2 3+ positive, Ki-67 50%, axilla negative, T2N0   01/26/2020 Cancer Staging   Staging form: Breast, AJCC 8th Edition - Clinical stage from 01/26/2020: Stage IIA (cT2, cN0, cM0, G3, ER+, PR-, HER2+) - Signed by Serena Croissant, MD on 01/26/2020   02/16/2020 - 11/16/2020 Neo-Adjuvant Chemotherapy   Neoadjuvant weekly Taxol/Herceptin x 12 from 02/16/2020-05/13/2020, followed by Herceptin maintenance every 3 weeks, last given on 11/16/2020 stopped early due to increased fatigue.     06/15/2020 Surgery   Left lumpectomy ([facility]): no residual carcinoma, 4 left axillary lymph nodes negative for carcinoma   06/15/2020 Cancer Staging   Staging form: Breast, AJCC 8th Edition - Pathologic stage from 06/15/2020: No Stage Recommended (ypT0, pN0, cM0) - Signed by Loa Socks, NP on 03/07/2021 Stage prefix: Post-therapy Response to neoadjuvant therapy: Complete response   07/23/2020 - 08/22/2020 Radiation Therapy   Adjuvant radiation [facility]): The patient initially received a dose of 42.56 Gy in 16 fractions to the breast using  whole-breast tangent fields. This was delivered using a 3-D conformal technique. The patient then received a boost to the seroma. This delivered an additional 8 Gy in 4 fractions using a 3 field photon technique due to the depth of the seroma. The total dose was 50.56 Gy.   09/2020 -  Anti-estrogen oral therapy   Anastrozole daily     CHIEF COMPLIANT: Follow-up on anastrozole therapy  HISTORY OF PRESENT ILLNESS:  History of Present Illness   The patient, a breast cancer survivor of almost four years, presents for a routine follow-up. She reports overall good health, with the exception of some premature ventricular contractions (PVCs) with her heart. She has no complaints of chest pain or discomfort. She has been taking anastrozole, a medication associated with her breast cancer treatment, and believes it is causing her hair to thin, a known side effect of the medication. She also has a history of osteopenia, with a previous bone density score of -2.4, bordering osteoporosis.         ALLERGIES:  is allergic to paclitaxel, fosamax [alendronate], prednisone, clindamycin/lincomycin, and penicillins.  MEDICATIONS:  Current Outpatient Medications  Medication Sig Dispense Refill   acetaminophen (TYLENOL) 500 MG tablet 1 tablet as needed Orally every 6 hrs     anastrozole (ARIMIDEX) 1 MG tablet TAKE 1 TABLET BY MOUTH EVERY DAY 90 tablet 3   aspirin EC 81 MG tablet Take 81 mg by mouth daily. Swallow whole.     azelastine (OPTIVAR) 0.05 % ophthalmic solution 1 drop into affected eye Ophthalmic Twice a day for 30 days     Azelastine HCl 137 MCG/SPRAY SOLN 1 spray ea nostril Nasally Twice a day for 30 days  Biotin 25366 MCG TABS Take 10,000 mg by mouth daily.      calcium citrate (CALCITRATE - DOSED IN MG ELEMENTAL CALCIUM) 950 (200 Ca) MG tablet Take by mouth.     Calcium-Magnesium-Vitamin D (CITRACAL CALCIUM+D) 600-40-500 MG-MG-UNIT TB24 1 tablets Orally twice a day      Calcium-Phosphorus-Vitamin D (CITRACAL +D3 PO) Take 1 tablet by mouth in the morning and at bedtime.      ciclopirox (PENLAC) 8 % solution Apply topically at bedtime. Apply over nail and surrounding skin. Apply daily over previous coat. After seven (7) days, may remove with alcohol and continue cycle. 6.6 mL 0   clobetasol (TEMOVATE) 0.05 % external solution Apply topically 2 (two) times daily as needed.     fexofenadine (ALLEGRA) 180 MG tablet 1 tablet Swallow whole with water; do not take with fruit juices. Orally Once a day     fluticasone (FLONASE) 50 MCG/ACT nasal spray 1 spray Once Daily.     ipratropium (ATROVENT) 0.03 % nasal spray Place 2 sprays into both nostrils daily.     levocetirizine (XYZAL) 5 MG tablet 1 tablet in the evening Orally Once a day     metoprolol succinate (TOPROL-XL) 25 MG 24 hr tablet Take 25 mg by mouth daily.     mupirocin ointment (BACTROBAN) 2 % 1 application Externally Twice a day for 5 days     nitroGLYCERIN (NITROSTAT) 0.4 MG SL tablet Place 1 tablet (0.4 mg total) under the tongue every 5 (five) minutes as needed for chest pain. 30 tablet 0   ondansetron (ZOFRAN) 8 MG tablet TAKE 1 TABLET BY MOUTH EVERY DAY AS NEEDED FOR 30 DAYS for 30     pantoprazole (PROTONIX) 40 MG tablet TAKE 1 TABLET BY MOUTH EVERY DAY 90 tablet 2   Polyvinyl Alcohol-Povidone PF (REFRESH) 1.4-0.6 % SOLN Apply 1 drop to eye 3 (three) times daily as needed.     ranolazine (RANEXA) 500 MG 12 hr tablet Take 1 tablet (500 mg total) by mouth 2 (two) times daily. 60 tablet 6   rosuvastatin (CRESTOR) 40 MG tablet Take 40 mg by mouth daily.      Specialty Vitamins Products (BIOTIN PLUS KERATIN) 10000-100 MCG-MG TABS 1 tablet Orally once a day     valACYclovir (VALTREX) 1000 MG tablet 2 tablets Orally once a day as needed for a flare up     No current facility-administered medications for this visit.    PHYSICAL EXAMINATION: ECOG PERFORMANCE STATUS: 1 - Symptomatic but completely  ambulatory  Vitals:   09/25/23 1350  BP: (!) 138/57  Pulse: 68  Resp: 18  Temp: 97.6 F (36.4 C)  SpO2: 100%   Filed Weights   09/25/23 1350  Weight: 112 lb 3.2 oz (50.9 kg)    Physical Exam   BREAST: Scar tissue present, no concerns noted.      (exam performed in the presence of a chaperone)  LABORATORY DATA:  I have reviewed the data as listed    Latest Ref Rng & Units 05/23/2023    3:11 PM 03/11/2022   11:37 AM 10/22/2021   11:03 AM  CMP  Glucose 70 - 99 mg/dL 96  440  88   BUN 8 - 23 mg/dL 15  17  20    Creatinine 0.44 - 1.00 mg/dL 3.47  4.25  9.56   Sodium 135 - 145 mmol/L 140  140  139   Potassium 3.5 - 5.1 mmol/L 4.0  5.2  4.8   Chloride 98 -  111 mmol/L 103  102  106   CO2 22 - 32 mmol/L 28  20  30    Calcium 8.9 - 10.3 mg/dL 9.5  9.9  9.5   Total Protein 6.0 - 8.5 g/dL  7.4  7.1   Total Bilirubin 0.0 - 1.2 mg/dL  0.4  0.4   Alkaline Phos 44 - 121 IU/L  81  69   AST 0 - 40 IU/L  27  18   ALT 0 - 32 IU/L  10  8     Lab Results  Component Value Date   WBC 8.3 05/23/2023   HGB 13.4 05/23/2023   HCT 41.2 05/23/2023   MCV 91.8 05/23/2023   PLT 252 05/23/2023   NEUTROABS 3.3 10/22/2021    ASSESSMENT & PLAN:  Malignant neoplasm of lower-outer quadrant of left breast of female, estrogen receptor positive (HCC) 01/18/2020:Palpable left breast lump with calcifications at 5 o'clock position measuring 3.3 cm.  Biopsy revealed grade 3 IDC ER 95%, PR 0%, HER-2 3+ positive, Ki-67 50%, axilla negative, T2N0 stage IIa clinical stage Bilateral implants removed 2005   Treatment plan: 1.  Neo- adjuvant chemotherapy with Taxol Herceptin  2.  Breast conserving surgery with sentinel lymph node biopsy 3.  Adjuvant radiation therapy completed 08/21/2020 4.  Adjuvant antiestrogen therapy with anastrozole 1 mg daily x5 years   Breast MRI: 2.3 cm mass, indeterminate 8 mm mass (Biopsy: ADH), no  LN --------------------------------------------------------------------------------------------------------------------------------------------------------------- 06/15/20: Left lumpectomy ([facility]): no residual carcinoma, 4 left axillary lymph nodes negative for carcinoma   Plan: 1. Anti-HER-2 therapy  11/16/2020 (stopped early due to fatigue) 2. adjuvant radiation therapy completed 08/21/2020 3.  Adjuvant antiestrogen therapy with anastrozole 1 mg daily x5 years  started January 2022) --------------------------------------------------------------------------------------------------------------------   Anastrozole toxicities : Denies any adverse effects to anastrozole.  Denies any hot flashes or myalgias. Osteoporosis: In 2018 at bone density was T score -2.6:  Prolia injections every 6 months.     Breast cancer surveillance: 1.  Breast exam 09/25/2023: Palpable nodule in the left breast 5 o'clock position 2. mammogram 05/29/2023: Benign breast density category C Biopsy 10/09/2022: Benign Bone density 09/18/2021: T score -2.4: Severe osteopenia but improved from 2018 (was -2.6)   Osteopenia Last bone density test was 2 years ago with a score of -2.4, bordering osteoporosis. -Schedule a follow-up bone density test   Return to clinic in 1 year for follow-up   Orders Placed This Encounter  Procedures   DG Bone Density    Standing Status:   Future    Expected Date:   10/26/2023    Expiration Date:   09/24/2024    Reason for Exam (SYMPTOM  OR DIAGNOSIS REQUIRED):   post menopausal    Preferred imaging location?:   [facility]    Release to patient:   Immediate   The patient has a good understanding of the overall plan. she agrees with it. she will call with any problems that may develop before the next visit here. Total time spent: 30 mins including face to face time and time spent for planning, charting and co-ordination of care   Tamsen Meek, MD 09/25/23

## 2023-10-09 ENCOUNTER — Ambulatory Visit (HOSPITAL_COMMUNITY): Payer: Medicare HMO | Attending: Cardiology

## 2023-10-09 DIAGNOSIS — I493 Ventricular premature depolarization: Secondary | ICD-10-CM | POA: Diagnosis not present

## 2023-10-09 DIAGNOSIS — Z951 Presence of aortocoronary bypass graft: Secondary | ICD-10-CM | POA: Diagnosis not present

## 2023-10-09 DIAGNOSIS — I25118 Atherosclerotic heart disease of native coronary artery with other forms of angina pectoris: Secondary | ICD-10-CM | POA: Diagnosis not present

## 2023-10-09 LAB — ECHOCARDIOGRAM COMPLETE
Area-P 1/2: 4.19 cm2
MV M vel: 5.2 m/s
MV Peak grad: 108.2 mm[Hg]
P 1/2 time: 486 ms
Radius: 0.65 cm
S' Lateral: 2.4 cm

## 2023-10-13 ENCOUNTER — Telehealth: Payer: Self-pay | Admitting: Cardiovascular Disease

## 2023-10-13 NOTE — Telephone Encounter (Signed)
 Patient is requesting call back in regards to results. Please advise.

## 2023-10-13 NOTE — Telephone Encounter (Signed)
 Called patient back, advised of ECHO results.  Patient aware that Dr.Berry would like for her to be seen to discuss further.  KG, Not sure where you might want her or if APP could see her to discuss further on the ECHO.   Thanks!

## 2023-10-13 NOTE — Telephone Encounter (Signed)
 Called patient, LVM to call back.  Left call back number.

## 2023-10-14 ENCOUNTER — Ambulatory Visit: Payer: Medicare HMO | Attending: Cardiovascular Disease | Admitting: Cardiovascular Disease

## 2023-10-14 ENCOUNTER — Encounter: Payer: Self-pay | Admitting: Cardiovascular Disease

## 2023-10-14 VITALS — BP 122/54 | HR 76 | Ht 61.0 in | Wt 110.0 lb

## 2023-10-14 DIAGNOSIS — I2510 Atherosclerotic heart disease of native coronary artery without angina pectoris: Secondary | ICD-10-CM | POA: Diagnosis not present

## 2023-10-14 DIAGNOSIS — I340 Nonrheumatic mitral (valve) insufficiency: Secondary | ICD-10-CM | POA: Diagnosis not present

## 2023-10-14 DIAGNOSIS — Z9861 Coronary angioplasty status: Secondary | ICD-10-CM

## 2023-10-14 DIAGNOSIS — E785 Hyperlipidemia, unspecified: Secondary | ICD-10-CM | POA: Diagnosis not present

## 2023-10-14 DIAGNOSIS — I25118 Atherosclerotic heart disease of native coronary artery with other forms of angina pectoris: Secondary | ICD-10-CM | POA: Diagnosis not present

## 2023-10-14 DIAGNOSIS — R002 Palpitations: Secondary | ICD-10-CM

## 2023-10-14 DIAGNOSIS — Z951 Presence of aortocoronary bypass graft: Secondary | ICD-10-CM

## 2023-10-14 NOTE — Telephone Encounter (Signed)
Pt coming scheduled today @ 3

## 2023-10-14 NOTE — Assessment & Plan Note (Signed)
Palpitations demonstrated to be frequent PVCs by event monitoring performed 06/19/2023.  She had 11% PVC burden.  She did see Dr. Lalla Brothers who placed her on mexiletine which afforded her no clinical benefit and she ultimately stopped.  She is on no beta-blocker at the current time.  She thinks that it is possible that her PVCs are related to a supplement that she is taking.

## 2023-10-14 NOTE — Assessment & Plan Note (Signed)
History of CAD status post cardiac catheterization by Dr. Herbie Baltimore 5//20 revealing normal LV function and high-grade mid AV groove circumflex stenosis [place] along with high-grade diffuse calcified RCA stenosis with a fairly normal LAD.  She underwent LAD intervention by Dr. Dr. Herbie Baltimore with staged orbital atherectomy, PCI drug-eluting stenting of the distal RCA by Dr. Clifton James 01/06/2019.  I performed diagnostic catheter 06/24/2019 revealing high-grade in-stent restenosis within the previously placed RCA stent which I was unable to cross.  She ultimately underwent CABG x 1 with a vein graft to the distal RCA by Dr. Laneta Simmers.  She denies chest pain.

## 2023-10-14 NOTE — Assessment & Plan Note (Signed)
History of mitral regurgitation which was moderate by 2D echo 11/07/2021.  Her most recent 2D echo performed 10/09/2023 showed normal LV systolic size and function with severe MR and mild aortic insufficiency.  She is relatively asymptomatic.  I am going to follow her MR by 2D echo on an annual basis.

## 2023-10-14 NOTE — Telephone Encounter (Signed)
Left message for pt to call back to get scheduled to see Dr. Allyson Sabal.

## 2023-10-14 NOTE — Assessment & Plan Note (Signed)
I performed cardiac catheterization on her 06/24/2019 revealing high-grade "in-stent restenosis" within the previously placed RCA stent which I was unable to cross.  She ultimately underwent CABG x 1 by Dr. Laneta Simmers with a vein graft to her distal RCA and was discharged home 07/02/2019.  She denies chest pain.

## 2023-10-14 NOTE — Progress Notes (Signed)
10/14/2023 Rachel Valentine   [birth year]  [id number]  Primary Physician Shon Hale, MD Primary Cardiologist: Runell Gess MD Nicholes [place], [place]  HPI:  Rachel Valentine is a 83 y.o.   widowed Caucasian female mother of 2, grandmother and one grandchild referred to me initially by Dr. Clyde Canterbury, her prior PCP.  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    I last saw her in the office 06/04/2022.  She was having palpitations and chest pain.  Her risk factors include hyperlipidemia and family history with a mother who had CABG.  She is never had a heart attack or stroke.  She was admitted to [facility] on 01/01/2023 unstable angina.  She ruled out for myocardial infarction.  She underwent cardiac catheterization by Dr. Herbie Baltimore 01/04/2019 revealing normal LV function, high-grade mid AV groove circumflex on a band and high-grade diffuse calcified RCA stenosis with a fairly normal LAD.  She underwent LAD intervention by Dr. Herbie Baltimore with staged diamondback orbital rotational atherectomy, PCI and drug-eluting stenting of the proximal mid and distal RCA by Dr. Clifton James on 01/06/2019.  She was discharged home the following day.  She is felt well and denies chest pain.  She is on aspirin and Plavix as well as high-dose atorvastatin and a beta-blocker.  She is a fairly active exerciser and currently is back walking 2.5 to 4 miles a day 6 to 7 days a week without limitation.   She underwent outpatient radial diagnostic coronary angiography by myself 06/24/2019 revealing high-grade in-stent restenosis within in the previously placed RCA stents which I was unable to cross.  Based on this she underwent CABG x1 with a vein graft to the distal RCA by Dr. Laneta Simmers during the same hospitalization and was discharged home on 07/02/2019.  She is done well since.  She gets occasional early evening palpitations.   She has  been diagnosed with breast cancer and has finished her chemotherapy and radiation therapy.  She currently is on Herceptin and has 5 sessions left.   She had developed gross hematuria for unclear reasons being worked up by Dr. Annabell Howells at [facility] urology.  She is had symptomatic anemia requiring transfusion of 2 units of packed red blood cells.  Hematuria has resolved.   She had noticed increasing symptomatic palpitations which began after her Prolia shot at the end of February.  Apparently this is one of the known side effects of this medication.  She did have a 2D echo performed 11/07/2021 that showed normal LV function with, with grade 2 diastolic dysfunction, and RV systolic pressure of 40 with moderate MR and moderate AI.  An event monitor showed frequent PVCs often in a prior trigeminal pattern with occasional couplets and triplets as well as episodes of SVT associated with the symptoms.  I did double her beta-blocker which resulted in some improvement in her symptoms.   Since I saw her in the office a year and a half ago she did have an event monitor that showed frequent PVCs with 11% burden.  She saw Dr. Lalla Brothers who placed her on mexiletine with no clinical benefit.  She also had a 2D echocardiogram performed 10/05/2023 that showed normal LV size and function with severe MR that had progressed since her previous echo 2 years before.  She is otherwise asymptomatic.   Current Meds  Medication Sig   acetaminophen (TYLENOL) 500 MG tablet 1 tablet  as needed Orally every 6 hrs   anastrozole (ARIMIDEX) 1 MG tablet TAKE 1 TABLET BY MOUTH EVERY DAY   aspirin EC 81 MG tablet Take 81 mg by mouth daily. Swallow whole.   azelastine (OPTIVAR) 0.05 % ophthalmic solution 1 drop into affected eye Ophthalmic Twice a day for 30 days   Azelastine HCl 137 MCG/SPRAY SOLN 1 spray ea nostril Nasally Twice a day for 30 days   Biotin 14782 MCG TABS Take 10,000 mg by mouth daily.    calcium citrate (CALCITRATE - DOSED IN MG  ELEMENTAL CALCIUM) 950 (200 Ca) MG tablet Take by mouth.   Calcium-Magnesium-Vitamin D (CITRACAL CALCIUM+D) 600-40-500 MG-MG-UNIT TB24 1 tablets Orally twice a day   Calcium-Phosphorus-Vitamin D (CITRACAL +D3 PO) Take 1 tablet by mouth in the morning and at bedtime.    ciclopirox (PENLAC) 8 % solution Apply topically at bedtime. Apply over nail and surrounding skin. Apply daily over previous coat. After seven (7) days, may remove with alcohol and continue cycle.   clobetasol (TEMOVATE) 0.05 % external solution Apply topically 2 (two) times daily as needed.   fexofenadine (ALLEGRA) 180 MG tablet 1 tablet Swallow whole with water; do not take with fruit juices. Orally Once a day   fluticasone (FLONASE) 50 MCG/ACT nasal spray 1 spray Once Daily.   ipratropium (ATROVENT) 0.03 % nasal spray Place 2 sprays into both nostrils daily.   levocetirizine (XYZAL) 5 MG tablet 1 tablet in the evening Orally Once a day   metoprolol succinate (TOPROL-XL) 25 MG 24 hr tablet Take 25 mg by mouth daily.   mupirocin ointment (BACTROBAN) 2 % 1 application Externally Twice a day for 5 days   nitroGLYCERIN (NITROSTAT) 0.4 MG SL tablet Place 1 tablet (0.4 mg total) under the tongue every 5 (five) minutes as needed for chest pain.   ondansetron (ZOFRAN) 8 MG tablet TAKE 1 TABLET BY MOUTH EVERY DAY AS NEEDED FOR 30 DAYS for 30   pantoprazole (PROTONIX) 40 MG tablet TAKE 1 TABLET BY MOUTH EVERY DAY   Polyvinyl Alcohol-Povidone PF (REFRESH) 1.4-0.6 % SOLN Apply 1 drop to eye 3 (three) times daily as needed.   rosuvastatin (CRESTOR) 40 MG tablet Take 40 mg by mouth daily.    Specialty Vitamins Products (BIOTIN PLUS KERATIN) 10000-100 MCG-MG TABS 1 tablet Orally once a day   valACYclovir (VALTREX) 1000 MG tablet 2 tablets Orally once a day as needed for a flare up   [DISCONTINUED] ranolazine (RANEXA) 500 MG 12 hr tablet Take 1 tablet (500 mg total) by mouth 2 (two) times daily.     Allergies  Allergen Reactions   Paclitaxel  Rash   Fosamax [Alendronate] Nausea Only    Unset stomach   Prednisone Other (See Comments)    Blood pressure high when she takes it   Clindamycin/Lincomycin Rash   Penicillins Rash    Did it involve swelling of the face/tongue/throat, SOB, or low BP? No  Did it involve sudden or severe rash/hives, skin peeling, or any reaction on the inside of your mouth or nose? No  Did you need to seek medical attention at a hospital or doctor's office? Yes  When did it last happen?      Childhood  If all above answers are "NO", may proceed with cephalosporin use.    Social History   Socioeconomic History   Marital status: Widowed    Spouse name: Not on file   Number of children: Not on file   Years of education: Not  on file   Highest education level: Not on file  Occupational History   Not on file  Tobacco Use   Smoking status: Former    Current packs/day: 0.00    Types: Cigarettes    Quit date: 01/06/1999    Years since quitting: 24.7   Smokeless tobacco: Never  Vaping Use   Vaping status: Never Used  Substance and Sexual Activity   Alcohol use: No   Drug use: Never   Sexual activity: Not on file  Other Topics Concern   Not on file  Social History Narrative   Not on file   Social Drivers of Health   Financial Resource Strain: Not on file  Food Insecurity: Not on file  Transportation Needs: Not on file  Physical Activity: Not on file  Stress: Not on file  Social Connections: Not on file  Intimate Partner Violence: Not on file     Review of Systems: General: negative for chills, fever, night sweats or weight changes.  Cardiovascular: negative for chest pain, dyspnea on exertion, edema, orthopnea, palpitations, paroxysmal nocturnal dyspnea or shortness of breath Dermatological: negative for rash Respiratory: negative for cough or wheezing Urologic: negative for hematuria Abdominal: negative for nausea, vomiting, diarrhea, bright red blood per rectum, melena, or  hematemesis Neurologic: negative for visual changes, syncope, or dizziness All other systems reviewed and are otherwise negative except as noted above.    Blood pressure (!) 122/54, pulse 76, height 5\' 1"  (1.549 m), weight 110 lb (49.9 kg), SpO2 100%.  General appearance: alert and no distress Neck: no adenopathy, no JVD, supple, symmetrical, trachea midline, thyroid not enlarged, symmetric, no tenderness/mass/nodules, and bilateral carotid bruits Lungs: clear to auscultation bilaterally Heart: 2/6 apical systolic murmur consistent with MR. Extremities: extremities normal, atraumatic, no cyanosis or edema Pulses: 2+ and symmetric Skin: Skin color, texture, turgor normal. No rashes or lesions Neurologic: Grossly normal  EKG not performed today      ASSESSMENT AND PLAN:   CAD S/P percutaneous coronary angioplasty History of CAD status post cardiac catheterization by Dr. Herbie Baltimore 5//20 revealing normal LV function and high-grade mid AV groove circumflex stenosis [place] along with high-grade diffuse calcified RCA stenosis with a fairly normal LAD.  She underwent LAD intervention by Dr. Dr. Herbie Baltimore with staged orbital atherectomy, PCI drug-eluting stenting of the distal RCA by Dr. Clifton James 01/06/2019.  I performed diagnostic catheter 06/24/2019 revealing high-grade in-stent restenosis within the previously placed RCA stent which I was unable to cross.  She ultimately underwent CABG x 1 with a vein graft to the distal RCA by Dr. Laneta Simmers.  She denies chest pain.  Dyslipidemia, goal LDL below 70 History of dyslipidemia on high-dose Crestor with lipid profile performed 04/10/2023 revealing a total cholesterol of 151, LDL 64 and HDL 74.  S/P CABG x 1 I performed cardiac catheterization on her 06/24/2019 revealing high-grade "in-stent restenosis" within the previously placed RCA stent which I was unable to cross.  She ultimately underwent CABG x 1 by Dr. Laneta Simmers with a vein graft to her distal RCA and was  discharged home 07/02/2019.  She denies chest pain.  Mitral regurgitation History of mitral regurgitation which was moderate by 2D echo 11/07/2021.  Her most recent 2D echo performed 10/09/2023 showed normal LV systolic size and function with severe MR and mild aortic insufficiency.  She is relatively asymptomatic.  I am going to follow her MR by 2D echo on an annual basis.  Palpitations Palpitations demonstrated to be frequent PVCs by event monitoring  performed 06/19/2023.  She had 11% PVC burden.  She did see Dr. Lalla Brothers who placed her on mexiletine which afforded her no clinical benefit and she ultimately stopped.  She is on no beta-blocker at the current time.  She thinks that it is possible that her PVCs are related to a supplement that she is taking.     Runell Gess MD FACP,FACC,FAHA, [facility] 10/14/2023 3:37 PM

## 2023-10-14 NOTE — Patient Instructions (Signed)
Medication Instructions:  Your physician recommends that you continue on your current medications as directed. Please refer to the Current Medication list given to you today.  *If you need a refill on your cardiac medications before your next appointment, please call your pharmacy*   Testing/Procedures: Your physician has requested that you have an echocardiogram. Echocardiography is a painless test that uses sound waves to create images of your heart. It provides your doctor with information about the size and shape of your heart and how well your heart's chambers and valves are working. This procedure takes approximately one hour. There are no restrictions for this procedure. Please do NOT wear cologne, perfume, aftershave, or lotions (deodorant is allowed). Please arrive 15 minutes prior to your appointment time. **To be done in February 2026**  Please note: We ask at that you not bring children with you during ultrasound (echo/ vascular) testing. Due to room size and safety concerns, children are not allowed in the ultrasound rooms during exams. Our front office staff cannot provide observation of children in our lobby area while testing is being conducted. An adult accompanying a patient to their appointment will only be allowed in the ultrasound room at the discretion of the ultrasound technician under special circumstances. We apologize for any inconvenience.    Follow-Up: At [facility], you and your health needs are our priority.  As part of our continuing mission to provide you with exceptional heart care, we have created designated Provider Care Teams.  These Care Teams include your primary Cardiologist (physician) and Advanced Practice Providers (APPs -  Physician Assistants and Nurse Practitioners) who all work together to provide you with the care you need, when you need it.  We recommend signing up for the patient portal called "MyChart".  Sign up information is provided on  this After Visit Summary.  MyChart is used to connect with patients for Virtual Visits (Telemedicine).  Patients are able to view lab/test results, encounter notes, upcoming appointments, etc.  Non-urgent messages can be sent to your provider as well.   To learn more about what you can do with MyChart, go to [email or web address].    Your next appointment:   6 month(s)  Provider:   Nanetta Batty, MD     Other Instructions

## 2023-10-14 NOTE — Assessment & Plan Note (Signed)
History of dyslipidemia on high-dose Crestor with lipid profile performed 04/10/2023 revealing a total cholesterol of 151, LDL 64 and HDL 74.

## 2023-10-28 ENCOUNTER — Other Ambulatory Visit: Payer: Self-pay | Admitting: Hematology and Oncology

## 2023-10-28 ENCOUNTER — Ambulatory Visit (HOSPITAL_BASED_OUTPATIENT_CLINIC_OR_DEPARTMENT_OTHER)
Admission: RE | Admit: 2023-10-28 | Discharge: 2023-10-28 | Disposition: A | Discharge status: Home / Self Care | Payer: Medicare HMO | Source: Ambulatory Visit | Attending: Hematology and Oncology | Admitting: Hematology and Oncology

## 2023-10-28 ENCOUNTER — Telehealth: Payer: Self-pay | Admitting: Hematology and Oncology

## 2023-10-28 DIAGNOSIS — Z780 Asymptomatic menopausal state: Secondary | ICD-10-CM | POA: Insufficient documentation

## 2023-10-28 DIAGNOSIS — M810 Age-related osteoporosis without current pathological fracture: Secondary | ICD-10-CM | POA: Diagnosis not present

## 2023-10-28 NOTE — Progress Notes (Signed)
 I discussed with her that the bone density showed a T-score of -2.6 which is in the osteoporosis category. Previously she could not tolerate Prolia injections which caused palpitations and bisphosphonate orally which caused stomach upset. Therefore our only option is IV Zometa annually. She still has palpitations and she will be going through a cardiology workup and once her palpitations get better, she will call us and we will set her up for IV Zometa treatment.

## 2023-10-28 NOTE — Telephone Encounter (Signed)
 I called the patient about her bone density test.

## 2023-11-18 NOTE — Progress Notes (Unsigned)
 Electrophysiology Clinic Note    Date:  11/19/2023  Patient ID:  Rachel Valentine, Rachel Valentine 08/05/1941, MRN [id number] PCP:  Shon Hale, MD  Cardiologist:  Nanetta Batty, MD Electrophysiologist: Lanier Prude, MD    Discussed the use of AI scribe software for clinical note transcription with the patient, who gave verbal consent to proceed.   Patient Profile    Chief Complaint: PVC follow-up  History of Present Illness: Rachel Valentine is a 83 y.o. female with PMH notable for PVCs, CAD s/p CABG x 1, HLD, Breast Ca; seen today for Lanier Prude, MD for routine electrophysiology followup.  She last saw Dr. Lalla Brothers 07/2023 for initial consult of PVCs. Previous zio with 11% PVC burden without activity limitations. Metop dose helped palps but caused dizziness, though it was continued He started PO mag and mexiletine. She did not notice any improvement with mexiletine, so stopped it  I saw her about 2 months ago where she continued to have significant fatigue. She was hesitant to start amiodarone, so added Renexa to metop. Updated TTE showed severe MR, severe TR and was referred back to Dr. Allyson Sabal, who has recommended continued monitoring of her MR.   On follow-up today, She did trial renexa for 3 weeks and noticed no improvement. She has had improvement in her palpitations with stopping her colagen peptides supplement. She continues to take 25mg  toprol nightly. She brings BP log with readings 100-130s systolic, very rarely 90s. She has had an overall improvement in her energy, but still has some days where she does not feel like doing anything.  She recently had a bone density that was "not good" and her oncologist has recommended zemata. She is nervous about this medicaiton causing increased palpitations.    AAD History: Mexiletine - started 07/2023, ineffective Renexa - started 09/2023, ineffective   ROS:  Please see the history of present illness. All  other systems are reviewed and otherwise negative.    Physical Exam    VS:  BP (!) 157/75   Pulse 64   Ht 5\' 1"  (1.549 m)   Wt 110 lb 9.6 oz (50.2 kg)   LMP  (LMP Unknown)   SpO2 98%   BMI 20.90 kg/m  BMI: Body mass index is 20.9 kg/m.  Wt Readings from Last 3 Encounters:  11/19/23 110 lb 9.6 oz (50.2 kg)  10/14/23 110 lb (49.9 kg)  09/25/23 112 lb 3.2 oz (50.9 kg)     GEN- The patient is well appearing, alert and oriented x 3 today.   Lungs- Clear to ausculation bilaterally, normal work of breathing.  Heart- Regular rate with ectopy and rhythm, no murmurs, rubs or gallops Extremities- No peripheral edema, warm, dry    Studies Reviewed   Previous EP, cardiology notes.    EKG is ordered. Personal review of EKG from today shows:    EKG Interpretation Date/Time:  Wednesday November 19 2023 09:57:13 EDT Ventricular Rate:  64 PR Interval:  164 QRS Duration:  72 QT Interval:  414 QTC Calculation: 427 R Axis:   10  Text Interpretation: Normal sinus rhythm Possible Left atrial enlargement Confirmed by Sherie Don [phone]) on 11/19/2023 10:00:40 AM    TTE, 10/09/2023  1. Left ventricular ejection fraction, by estimation, is 55 to 60%. The left ventricle has normal function. The left ventricle has no regional wall motion abnormalities. Left ventricular diastolic parameters are indeterminate. The average left ventricular global longitudinal strain is -19.4 %. The global longitudinal  strain is normal.   2. Right ventricular systolic function is normal. The right ventricular size is normal. There is mildly elevated pulmonary artery systolic pressure.   3. The mitral valve is abnormal. Severe mitral valve regurgitation.   4. Tricuspid regrugitation best displayed in clip 62. Tricuspid valve regurgitation is severe.   5. The aortic valve is tricuspid. There is moderate calcification of the aortic valve. There is moderate thickening of the aortic valve. Aortic valve regurgitation is  mild.   Comparison(s): Prior images reviewed side by side. Mitral and tricuspid regurgitation have worsened. Frequent PVCs noted on rhythm strip of this study.   Long term monitor, 06/19/2023 Patient had a min HR of 50 bpm, max HR of 152 bpm, and avg HR of 68 bpm. Predominant underlying rhythm was Sinus Rhythm. 5 Supraventricular Tachycardia runs occurred, the run with the fastest interval lasting 4 beats with a max rate of 152 bpm, the longest lasting 7 beats with an avg rate of 102 bpm. Isolated SVEs were rare (<1.0%), SVE Couplets were rare (<1.0%), and SVE Triplets were rare (<1.0%). Isolated VEs were frequent (11.0%, 31045), VE Couplets were rare (<1.0%, 269), and VE Triplets were rare (<1.0%, 9). Ventricular Bigeminy and Trigeminy were present.    SR/SB/ST Short runs of SVT Occasional PACs/ Freq PVSs (11%burden) ROV to discuss  TTE, 12/10/2022  1. Left ventricular ejection fraction, by estimation, is 60 to 65%. The left ventricle has normal function. The left ventricle has no regional wall motion abnormalities. Left ventricular diastolic parameters are indeterminate.   2. Right ventricular systolic function is normal. The right ventricular size is normal. There is normal pulmonary artery systolic pressure.   3. The mitral valve is degenerative. Mild to moderate mitral valve regurgitation. No evidence of mitral stenosis. Moderate mitral annular calcification.   4. Tricuspid valve regurgitation is mild to moderate.   5. The aortic valve is tricuspid. There is moderate calcification of the aortic valve. There is moderate thickening of the aortic valve. Aortic valve regurgitation is mild.   6. The inferior vena cava is dilated in size with >50% respiratory variability, suggesting right atrial pressure of 8 mmHg.   Comparison(s): No significant change from prior study.   Conclusion(s)/Recommendation(s): Otherwise normal echocardiogram, with minor abnormalities described in the report.      Assessment and Plan     #) PVC Recent Echo with preserved LVEF Patient tried on mexiletine and renexa without clinical improvement Not a candidate for PVC ablation given LVOT origin EKG today without PVC Continue 25mg  toprol nightly Encouraged patient to increase physical activity as tolerated Will discuss Zemeta with pharmacist, encouraged her to also discuss with oncology pharmacist   #) CAD s/p CABG x 1 No symptoms suggestive of angina at this time       Current medicines are reviewed at length with the patient today.   The patient has concerns regarding her medicines.  The following changes were made today:   START 500mg  renexa BID  Labs/ tests ordered today include:  Orders Placed This Encounter  Procedures   EKG 12-Lead     Disposition: Follow up with Dr. Lalla Brothers or EP APP in 6 months   Signed, Sherie Don, NP  11/19/23  12:29 PM  Electrophysiology [facility]

## 2023-11-19 ENCOUNTER — Encounter: Payer: Self-pay | Admitting: Cardiology

## 2023-11-19 ENCOUNTER — Ambulatory Visit: Payer: Medicare HMO | Attending: Cardiology | Admitting: Cardiology

## 2023-11-19 VITALS — BP 157/75 | HR 64 | Ht 61.0 in | Wt 110.6 lb

## 2023-11-19 DIAGNOSIS — R002 Palpitations: Secondary | ICD-10-CM

## 2023-11-19 DIAGNOSIS — I493 Ventricular premature depolarization: Secondary | ICD-10-CM | POA: Diagnosis not present

## 2023-11-19 NOTE — Patient Instructions (Addendum)
 Medication Instructions:  The current medical regimen is effective;  continue present plan and medications.  *If you need a refill on your cardiac medications before your next appointment, please call your pharmacy*   Follow-Up: At [facility], you and your health needs are our priority.  As part of our continuing mission to provide you with exceptional heart care, we have created designated Provider Care Teams.  These Care Teams include your primary Cardiologist (physician) and Advanced Practice Providers (APPs -  Physician Assistants and Nurse Practitioners) who all work together to provide you with the care you need, when you need it.  We recommend signing up for the patient portal called "MyChart".  Sign up information is provided on this After Visit Summary.  MyChart is used to connect with patients for Virtual Visits (Telemedicine).  Patients are able to view lab/test results, encounter notes, upcoming appointments, etc.  Non-urgent messages can be sent to your provider as well.   To learn more about what you can do with MyChart, go to [email or web address].    Your next appointment:   6 month(s)  Provider:   Steffanie Dunn, MD or Sherie Don, NP

## 2023-11-25 ENCOUNTER — Ambulatory Visit: Payer: Medicare HMO | Admitting: Cardiology

## 2023-12-25 DIAGNOSIS — H353131 Nonexudative age-related macular degeneration, bilateral, early dry stage: Secondary | ICD-10-CM | POA: Diagnosis not present

## 2023-12-25 DIAGNOSIS — H5212 Myopia, left eye: Secondary | ICD-10-CM | POA: Diagnosis not present

## 2024-01-08 ENCOUNTER — Ambulatory Visit: Attending: Physician Assistant | Admitting: Physician Assistant

## 2024-01-08 ENCOUNTER — Encounter: Payer: Self-pay | Admitting: Physician Assistant

## 2024-01-08 ENCOUNTER — Encounter: Payer: Self-pay | Admitting: Cardiovascular Disease

## 2024-01-08 VITALS — BP 130/69 | HR 76 | Ht 61.0 in | Wt 110.0 lb

## 2024-01-08 DIAGNOSIS — E785 Hyperlipidemia, unspecified: Secondary | ICD-10-CM | POA: Diagnosis not present

## 2024-01-08 DIAGNOSIS — R002 Palpitations: Secondary | ICD-10-CM | POA: Diagnosis not present

## 2024-01-08 DIAGNOSIS — I340 Nonrheumatic mitral (valve) insufficiency: Secondary | ICD-10-CM

## 2024-01-08 DIAGNOSIS — I25118 Atherosclerotic heart disease of native coronary artery with other forms of angina pectoris: Secondary | ICD-10-CM | POA: Diagnosis not present

## 2024-01-08 DIAGNOSIS — R0609 Other forms of dyspnea: Secondary | ICD-10-CM

## 2024-01-08 NOTE — Progress Notes (Signed)
 Cardiology Office Note:  .   Date:  01/10/2024  ID:  Rachel Valentine, DOB 12-28-1940, MRN [id number] PCP: Ransom Byers, MD  [facility] Providers Cardiologist:  Lauro Portal, MD Electrophysiologist:  Boyce Byes, MD    History of Present Illness: .   Rachel Valentine is a 83 y.o. female with PMH of hyperlipidemia, palpitation, frequent PVCs, mitral regurgitation and CAD.  She has never had a heart attack or stroke.  She was admitted at [facility] in May 2020 with unstable angina.  Enzyme was negative.  Subsequent cardiac catheterization on 01/04/2019 showed normal LV function, 95% proximal to mid left circumflex lesion treated with Resolute Onyx 2.5 x 12 mm DES, 80% mid to distal left circumflex lesion treated with balloon angioplasty with 30% residual stenosis, 99% proximal RCA lesion followed by 80% mid RCA lesion, EF 55 to 65%.  Patient underwent staged PCI on 01/05/2019 with successful atherectomy and placement of 2 DES to mid to distal RCA.  Unfortunately she developed a in-stent restenosis of the previously placed RCA stent in October 2020, Dr. Katheryne Pane attempted to cross the lesion unsuccessfully.  Based on this, she ultimately underwent CABG x 1 with SVG to distal RCA by Dr. Sherene Dilling during the same hospitalization.  She later developed breast cancer and finished chemo and radiation therapy.  She also had gross hematuria and was seen by Dr. Inga Manges of [facility] service.  In 2023, she developed symptomatic palpitation which began after her Prolia  shot in February, this was one of the known side effect of the medication.  Echocardiogram obtained on 11/07/2021 showed normal EF, grade 2 DD, moderate MR and moderate AI.  Heart monitor showed frequent PVCs often in trigeminal pattern with occasional couplet and triplet as well as SVT associated with symptoms.  Her beta-blocker was increased which resulted in improvement of her symptoms, but later cut back due  to dizziness.  I last saw the patient in October 2024 at which time her dizziness has improved, however palpitation worsened.  I increased her metoprolol  succinate back up to 25 mg every night.  I ordered a 3-day heart monitor to quantify her PVC burden, PVC burden came back 11%.  I referred her to EP service to review frequent PVCs.  She was seen by Dr. Marven Slimmer in November 2024 we will started her on mexiletine 150 mg twice a day.  Unfortunately she had no improvement on the mexiletine, she also tried Ranexa  without improvement either.  She was hesitant to start on amiodarone.  Echocardiogram obtained on 10/09/2023 showed EF 55 to 60%, severe MR, severe TR, mild AI.  Mitral regurgitation the tricuspid regurgitation has significantly progressed when compared to previous echocardiogram.  She was seen by Dr. Katheryne Pane in February who felt she was asymptomatic therefore plan to repeat echocardiogram in 1 year.  She was last seen by EP service in March 2025 at which time she has not noted any improvement on Ranexa  with her palpitation.  She was found not to be a candidate for PVC ablation given LVOT origin.  Patient presents today for follow-up.  She has occasional chest discomfort however this does not correlate with the degree of exertion and does not feel like her previous anginal symptom from 2020.  She can do heavy activity without any chest discomfort. She still has frequent palpitation which is likely PVCs. EKG today showed NSR with trigeminy.  She has been getting more and more short of breath lately.  She  used to be very active and able to walk 3 miles a day without any issue.  In the recent month, she has been getting short of breath walking shorter and shorter distance.  There were 3 to 4 days in the past month where she has noticed increased leg edema.  She did not have leg edema today on exam.  I am concerned that her symptoms may be related to severe mitral regurgitation.  I will obtain CBC, basic metabolic  panel, TSH and a BNP.  If BNP is elevated, I may give her a dose of diuretic.  However if the blood work is normal, I will discuss with Dr. Katheryne Pane to see if he would favor either TEE versus left and right heart cath to initiate workup for possible MitraClip.  I will see the patient back in 3 weeks for reassessment.  ROS:   [name], she also has occasional chest discomfort that does not correlate with physical exertion.  Studies Reviewed: Aaron Aas   EKG Interpretation Date/Time:  Thursday Jan 08 2024 15:04:44 EDT Ventricular Rate:  78 PR Interval:  148 QRS Duration:  76 QT Interval:  400 QTC Calculation: 456 R Axis:   60  Text Interpretation: Sinus rhythm with frequent Premature ventricular complexes When compared with ECG of 19-Nov-2023 09:57, Premature ventricular complexes are now Present Confirmed by Dru Laurel [phone]) on 01/08/2024 3:06:43 PM    Cardiac Studies & Procedures   ______________________________________________________________________________________________ CARDIAC CATHETERIZATION  CARDIAC CATHETERIZATION 06/24/2019  Conclusion Images from the original result were not included.   Prox RCA lesion is 99% stenosed.  Prox RCA to Mid RCA lesion is 50% stenosed.  Ost RCA to Prox RCA lesion is 70% stenosed.  Previously placed Prox Cx to Mid Cx stent (unknown type) is widely patent.  Ost Cx to Prox Cx lesion is 40% stenosed.  Mid Cx to Dist Cx lesion is 40% stenosed.  Ost LM to Mid LM lesion is 40% stenosed.  Rachel Valentine is a 83 y.o. female   [id number] LOCATION:  FACILITY: [facility] PHYSICIAN: Lauro Portal, M.D. 04-06-1941   DATE OF PROCEDURE:  06/24/2019  DATE OF DISCHARGE:     CARDIAC CATHETERIZATION / Attempted PCI RCA    History obtained from chart review.Rachel Valentine is a 83 year old thin appearing Caucasian female status post mid AV groove circumflex tenting by Dr. Addie Holstein in May followed by orbital  atherectomy, PCI and stenting of a diffusely calcified and diseased RCA by Dr. Abel Hoe the following day.  Her LAD is free of significant disease.  She has normal LV function.  She did well after that until recently when she is had accelerated angina which is nitro responsive.  She was seen virtually by Friddie Jetty, NP who arrange for her to undergo outpatient diagnostic coronary angiography today.   PROCEDURE DESCRIPTION:  The patient was brought to the second floor [facility] cath lab in the postabsorptive state.  She was premedicated with IV Versed  and fentanyl .  Her right wristwas prepped and shaved in usual sterile fashion. Xylocaine  1% was used for local anesthesia. A 6 French sheath was inserted into the right radial artery using standard Seldinger technique. The patient received 3000 units  of heparin  intravenously.  A 5 [place] TIG catheter pigtail catheters were used for selective coronary angiography and obtain left heart pressures.  Omnipaque  dye was used for the entirety of the case.  Retrograde aorta, ventricular and pullback pressures were recorded.  Radial cocktail was administered  via the SideArm sheath.  The patient received an additional 3000 units of heparin  with an ACT in the 300 range.  On the pigtail she is for the entirety of the intervention.  Retroaortic pressures monitored to the case.  Patient was already on uninterrupted DAPT with aspirin  and clopidogrel .  Using a 6 French AL 0.75 cm guide catheter along with a 0.14 prowater guidewire I attempted to cross the 99% mid dominant RCA stenosis within the previously placed stent.  The [place] would not go.  I used a 2 mm balloon as backup and again would not cross the vessel.  I changed to a [employer] and it appeared that the lesion directed the wire outside of the stent into the subintimal space.  I was unable to pass a balloon down to the lesion.  After reviewing the case with Dr. Loetta Ringer the consensus was to terminate  the procedure and get a T CTS consult.  Impression Unsuccessful attempt at RCA PCI and reintervention for aggressive in-stent restenosis in the setting of accelerated angina.  I was unable to cross the disease segment and I suspect the wire was outside of the stented segment in the subintimal space.  Because of this I elected to abort the procedure and have T CTS evaluate for CABG.  The sheath was removed and a TR band was placed on the right wrist to achieve patent hemostasis.  The patient left lab in stable condition.  Clopidogrel  was discontinued.  I will rerestart heparin  4 hours after sheath removal.  Lauro Portal. MD, [facility] 06/24/2019 2:14 PM  Findings Coronary Findings Diagnostic  Dominance: Right  Left Main Ost LM to Mid LM lesion is 40% stenosed.  Left Circumflex Ost Cx to Prox Cx lesion is 40% stenosed. Previously placed Prox Cx to Mid Cx stent (unknown type) is widely patent. The lesion is located at the bend. Mid Cx to Dist Cx lesion is 40% stenosed.  Right Coronary Artery Ost RCA to Prox RCA lesion is 70% stenosed. Prox RCA lesion is 99% stenosed. The lesion was previously treated. Prox RCA to Mid RCA lesion is 50% stenosed. The lesion was previously treated.  Intervention  No interventions have been documented.   CARDIAC CATHETERIZATION  CARDIAC CATHETERIZATION 01/05/2019  Conclusion  Prox RCA lesion is 50% stenosed.  Prox RCA to Mid RCA lesion is 99% stenosed.  Mid RCA to Dist RCA lesion is 90% stenosed.  A drug-eluting stent was successfully placed using a STENT SYNERGY DES 2.5X38.  Post intervention, there is a 0% residual stenosis.  A drug-eluting stent was successfully placed using a STENT SYNERGY DES 2.75X16.  Post intervention, there is a 0% residual stenosis.  1. Successful orbital atherectomy/PTCA/DES x 2 mid and distal RCA  Continue DAPT with ASA/Plavix  for one year. Continue statin and beta blocker.  Findings Coronary  Findings Diagnostic  Dominance: Right  Right Coronary Artery Prox RCA lesion is 50% stenosed. The lesion is eccentric. Prox RCA to Mid RCA lesion is 99% stenosed. The lesion is calcified. Mid RCA to Dist RCA lesion is 90% stenosed. The lesion is calcified.  Intervention  Prox RCA to Mid RCA lesion Stent Pre-stent angioplasty was performed using a BALLOON SAPPHIRE 2.0X15. A drug-eluting stent was successfully placed using a STENT SYNERGY DES 2.75X16. Stent strut is well apposed. Stent overlaps previously placed stent. Post-stent angioplasty was performed using a BALLOON SAPPHIRE [place] [id number]. Coronary atherectomy of the mid and distal lesion Post-Intervention Lesion Assessment The intervention was successful. Pre-interventional TIMI  flow is 3. Post-intervention TIMI flow is 3. No complications occurred at this lesion. There is a 0% residual stenosis post intervention.  Mid RCA to Dist RCA lesion Stent Pre-stent angioplasty was performed using a BALLOON SAPPHIRE 2.0X15. A drug-eluting stent was successfully placed using a STENT SYNERGY DES 2.5X38. Post-stent angioplasty was performed using a BALLOON SAPPHIRE [place] [id number]. Coronary atherectomy of the mid and distal lesion Post-Intervention Lesion Assessment The intervention was successful. Pre-interventional TIMI flow is 3. Post-intervention TIMI flow is 3. No complications occurred at this lesion. There is a 0% residual stenosis post intervention.     ECHOCARDIOGRAM  ECHOCARDIOGRAM COMPLETE 10/09/2023  Narrative ECHOCARDIOGRAM REPORT    Patient Name:   Rachel Valentine [facility] Date of Exam: 10/09/2023 Medical Rec #:  [id number]                 Height:       61.0 in Accession #:    [id number]                Weight:       112.2 lb Date of Birth:  01-11-1941                 BSA:          1.477 m Patient Age:    82 years                  BP:           175/72 mmHg Patient Gender: F                         HR:           68 bpm. Exam  Location:  [address]  Procedure: 2D Echo, Cardiac Doppler, Color Doppler and Strain Analysis  Indications:    I49.3 PVC's  History:        Patient has prior history of Echocardiogram examinations, most recent 12/10/2022. CAD; Prior CABG.  Sonographer:    Juventino Oppenheim RCS Referring Phys: [id number] SUZANN RIDDLE  IMPRESSIONS   1. Left ventricular ejection fraction, by estimation, is 55 to 60%. The left ventricle has normal function. The left ventricle has no regional wall motion abnormalities. Left ventricular diastolic parameters are indeterminate. The average left ventricular global longitudinal strain is -19.4 %. The global longitudinal strain is normal. 2. Right ventricular systolic function is normal. The right ventricular size is normal. There is mildly elevated pulmonary artery systolic pressure. 3. The mitral valve is abnormal. Severe mitral valve regurgitation. 4. Tricuspid regrugitation best displayed in clip 62. Tricuspid valve regurgitation is severe. 5. The aortic valve is tricuspid. There is moderate calcification of the aortic valve. There is moderate thickening of the aortic valve. Aortic valve regurgitation is mild.  Comparison(s): Prior images reviewed side by side. Mitral and tricuspid regurgitation have worsened. Frequent PVCs noted on rhythm strip of this study.  FINDINGS Left Ventricle: Left ventricular ejection fraction, by estimation, is 55 to 60%. The left ventricle has normal function. The left ventricle has no regional wall motion abnormalities. The average left ventricular global longitudinal strain is -19.4 %. The global longitudinal strain is normal. The left ventricular internal cavity size was normal in size. There is no left ventricular hypertrophy. Left ventricular diastolic parameters are indeterminate.  Right Ventricle: The right ventricular size is normal. No increase in right ventricular wall thickness. Right ventricular systolic function is normal.  There is mildly elevated pulmonary artery systolic pressure.  The tricuspid regurgitant velocity is 3.09 m/s, and with an assumed right atrial pressure of 3 mmHg, the estimated right ventricular systolic pressure is 41.2 mmHg.  Left Atrium: Left atrial size was normal in size.  Right Atrium: Right atrial size was normal in size.  Pericardium: There is no evidence of pericardial effusion.  Mitral Valve: The mitral valve is abnormal. Mild mitral annular calcification. Severe mitral valve regurgitation.  Tricuspid Valve: Tricuspid regrugitation best displayed in clip 62. The tricuspid valve is normal in structure. Tricuspid valve regurgitation is severe. No evidence of tricuspid stenosis.  Aortic Valve: The aortic valve is tricuspid. There is moderate calcification of the aortic valve. There is moderate thickening of the aortic valve. Aortic valve regurgitation is mild. Aortic regurgitation PHT measures 486 msec.  Pulmonic Valve: The pulmonic valve was normal in structure. Pulmonic valve regurgitation is mild. No evidence of pulmonic stenosis.  Aorta: The aortic root, ascending aorta and aortic arch are all structurally normal, with no evidence of dilitation or obstruction and the aortic root and ascending aorta are structurally normal, with no evidence of dilitation.  IAS/Shunts: The atrial septum is grossly normal.   LEFT VENTRICLE PLAX 2D LVIDd:         4.00 cm   Diastology LVIDs:         2.40 cm   LV e' medial:    8.81 cm/s LV PW:         0.80 cm   LV E/e' medial:  15.8 LV IVS:        0.90 cm   LV e' lateral:   13.60 cm/s LVOT diam:     2.00 cm   LV E/e' lateral: 10.2 LV SV:         62 LV SV Index:   42        2D Longitudinal Strain LVOT Area:     3.14 cm  2D Strain GLS (A2C):   -13.1 % 2D Strain GLS (A3C):   -27.1 % 2D Strain GLS (A4C):   -18.1 % 2D Strain GLS Avg:     -19.4 %  RIGHT VENTRICLE RV Basal diam:  3.30 cm RV S prime:     11.30 cm/s TAPSE (M-mode): 1.6 cm RVSP:            41.2 mmHg  LEFT ATRIUM             Index        RIGHT ATRIUM           Index LA diam:        3.70 cm 2.50 cm/m   RA Pressure: 3.00 mmHg LA Vol (A2C):   40.2 ml 27.21 ml/m  RA Area:     11.20 cm LA Vol (A4C):   38.5 ml 26.06 ml/m  RA Volume:   23.90 ml  16.18 ml/m LA Biplane Vol: 41.3 ml 27.96 ml/m AORTIC VALVE LVOT Vmax:   95.00 cm/s LVOT Vmean:  61.000 cm/s LVOT VTI:    0.197 m AI PHT:      486 msec  AORTA Ao Root diam: 3.00 cm Ao Asc diam:  3.50 cm  MITRAL VALVE                  TRICUSPID VALVE MV Area (PHT):                TR Peak grad:   38.2 mmHg MV Decel Time:  TR Vmax:        309.00 cm/s MR Peak grad:    108.2 mmHg   Estimated RAP:  3.00 mmHg MR Mean grad:    81.0 mmHg    RVSP:           41.2 mmHg MR Vmax:         520.00 cm/s MR Vmean:        433.0 cm/s   SHUNTS MR PISA:         2.65 cm     Systemic VTI:  0.20 m MR PISA Eff ROA: 16 mm       Systemic Diam: 2.00 cm MR PISA Radius:  0.65 cm MV E velocity: 139.00 cm/s MV A velocity: 81.20 cm/s MV E/A ratio:  1.71  Gloriann Larger MD Electronically signed by Gloriann Larger MD Signature Date/Time: 10/09/2023/3:52:14 PM    Final   TEE  ECHO INTRAOPERATIVE TEE 06/28/2019  Narrative *INTRAOPERATIVE TRANSESOPHAGEAL REPORT *    Patient Name:   Rachel Valentine [facility] Date of Exam: 06/28/2019 Medical Rec #:  [id number]           Height:       62.0 in Accession #:    [id number]          Weight:       117.0 lb Date of Birth:  1940-12-06           BSA:          1.52 m Patient Age:    78 years            BP:           119/62 mmHg Patient Gender: F                   HR:           87 bpm. Exam Location:  Inpatient  Transesophogeal exam was perform intraoperatively during surgical procedure. Patient was closely monitored under general anesthesia during the entirety of examination.  Indications:     CABG History:         CAD Risk Factors:Hypertension. Performing Phys: [id number] Diagnosing Phys: Ellena Gurney MD  Complications: No known complications during this procedure. POST-OP IMPRESSIONS Overall, there were no significant changes from pre-bypass.  PRE-OP FINDINGS Left Ventricle: The left ventricle has normal systolic function, with an ejection fraction of 55-60%. The cavity size was normal. There is no increase in left ventricular wall thickness.  Right Ventricle: The right ventricle has normal systolic function. The cavity was normal. There is no increase in right ventricular wall thickness.  Left Atrium: Left atrial size was normal in size.  Right Atrium: Right atrial size was normal in size. Right atrial pressure is estimated at 10 mmHg.  Interatrial Septum: No atrial level shunt detected by color flow Doppler.  Pericardium: There is no evidence of pericardial effusion.  Mitral Valve: The mitral valve is normal in structure. Mitral valve regurgitation is mild by color flow Doppler.  Tricuspid Valve: The tricuspid valve was normal in structure. Tricuspid valve regurgitation is trivial by color flow Doppler.  Aortic Valve: The aortic valve is normal in structure. Aortic valve regurgitation is trivial by color flow Doppler. There is no evidence of aortic valve stenosis.  Pulmonic Valve: The pulmonic valve was normal in structure No evidence of pumonic stenosis. Pulmonic valve regurgitation is trivial by color flow Doppler.   Aorta: The aortic root, ascending aorta and aortic arch are normal  in size and structure.   Ellena Gurney MD Electronically signed by Ellena Gurney MD Signature Date/Time: 06/30/2019/11:48:06 AM    Final  MONITORS  LONG TERM MONITOR (3-14 DAYS) 07/09/2023  Narrative Patch Wear Time:  2 days and 20 hours (2024-10-26T21:28:01-399 to 2024-10-29T18:23:11-398)  Patient had a min HR of 50 bpm, max HR of 152 bpm, and avg HR of 68 bpm. Predominant underlying rhythm was Sinus Rhythm. 5 Supraventricular Tachycardia runs  occurred, the run with the fastest interval lasting 4 beats with a max rate of 152 bpm, the longest lasting 7 beats with an avg rate of 102 bpm. Isolated SVEs were rare (<1.0%), SVE Couplets were rare (<1.0%), and SVE Triplets were rare (<1.0%). Isolated VEs were frequent (11.0%, 31045), VE Couplets were rare (<1.0%, 269), and VE Triplets were rare (<1.0%, 9). Ventricular Bigeminy and Trigeminy were present.  SR/SB/ST Short runs of SVT Occasional PACs/ Freq PVSs (11%burden) ROV to discuss       ______________________________________________________________________________________________      Risk Assessment/Calculations:            Physical Exam:   VS:  BP 130/69   Pulse 76   Ht 5\' 1"  (1.549 m)   Wt 110 lb (49.9 kg)   LMP  (LMP Unknown)   SpO2 97%   BMI 20.78 kg/m    Wt Readings from Last 3 Encounters:  01/08/24 110 lb (49.9 kg)  11/19/23 110 lb 9.6 oz (50.2 kg)  10/14/23 110 lb (49.9 kg)    GEN: Well nourished, well developed in no acute distress NECK: No JVD; No carotid bruits CARDIAC: RRR, no murmurs, rubs, gallops RESPIRATORY:  Clear to auscultation without rales, wheezing or rhonchi  ABDOMEN: Soft, non-tender, non-distended EXTREMITIES:  No edema; No deformity   ASSESSMENT AND PLAN: .    Dyspnea on exertion: I am concerned that her symptoms may be related to worsening mitral valve regurgitation.  I will discuss with Dr. Dean Every to see if he would recommend TEE versus left and right heart cath.  I recommended initial blood work including CBC, basic metabolic panel, TSH and BNP.  CAD: History of CABG x 1 with SVG to distal RCA.  She has occasional chest discomfort, however that not correlated with the degree of physical exertion and it feels different from the previous angina.  Palpitation: Previous heart monitor showed 11% PVCs, I referred her to EP service.  She has tried mexiletine and Ranexa  and did not notice any improvement on either therapy.  She has hesitant to  try amiodarone due to side effect.  Severe mitral regurgitation: Seen on echocardiogram in February.  See #1.  Hyperlipidemia: On rosuvastatin        Dispo: Follow-up in 3 weeks for reassessment  Signed, Mance Vallejo, PA

## 2024-01-08 NOTE — Patient Instructions (Signed)
 Medication Instructions:  NO CHANGES *If you need a refill on your cardiac medications before your next appointment, please call your pharmacy*  Lab Work: CBC, BNP,BMP,TSH TODAY If you have labs (blood work) drawn today and your tests are completely normal, you will receive your results only by: MyChart Message (if you have MyChart) OR A paper copy in the mail If you have any lab test that is abnormal or we need to change your treatment, we will call you to review the results.  Testing/Procedures: NO TESTING  Follow-Up: At [facility], you and your health needs are our priority.  As part of our continuing mission to provide you with exceptional heart care, our providers are all part of one team.  This team includes your primary Cardiologist (physician) and Advanced Practice Providers or APPs (Physician Assistants and Nurse Practitioners) who all work together to provide you with the care you need, when you need it.  Your next appointment:   2-3 week(s)  Provider:   Ervin Heath, PA

## 2024-01-08 NOTE — H&P (View-Only) (Signed)
 Cardiology Office Note:  .   Date:  01/10/2024  ID:  Rachel Valentine, DOB 12-28-1940, MRN [id number] PCP: Rachel Byers, MD  [facility] Providers Cardiologist:  Rachel Portal, MD Electrophysiologist:  Rachel Byes, MD    History of Present Illness: .   Rachel Valentine is a 83 y.o. female with PMH of hyperlipidemia, palpitation, frequent PVCs, mitral regurgitation and CAD.  She has never had a heart attack or stroke.  She was admitted at [facility] in May 2020 with unstable angina.  Enzyme was negative.  Subsequent cardiac catheterization on 01/04/2019 showed normal LV function, 95% proximal to mid left circumflex lesion treated with Resolute Onyx 2.5 x 12 mm DES, 80% mid to distal left circumflex lesion treated with balloon angioplasty with 30% residual stenosis, 99% proximal RCA lesion followed by 80% mid RCA lesion, EF 55 to 65%.  Patient underwent staged PCI on 01/05/2019 with successful atherectomy and placement of 2 DES to mid to distal RCA.  Unfortunately she developed a in-stent restenosis of the previously placed RCA stent in October 2020, Rachel Valentine attempted to cross the lesion unsuccessfully.  Based on this, she ultimately underwent CABG x 1 with SVG to distal RCA by Rachel Valentine during the same hospitalization.  She later developed breast cancer and finished chemo and radiation therapy.  She also had gross hematuria and was seen by Dr. Inga Valentine of [facility] service.  In 2023, she developed symptomatic palpitation which began after her Prolia  shot in February, this was one of the known side effect of the medication.  Echocardiogram obtained on 11/07/2021 showed normal EF, grade 2 DD, moderate MR and moderate AI.  Heart monitor showed frequent PVCs often in trigeminal pattern with occasional couplet and triplet as well as SVT associated with symptoms.  Her beta-blocker was increased which resulted in improvement of her symptoms, but later cut back due  to dizziness.  I last saw the patient in October 2024 at which time her dizziness has improved, however palpitation worsened.  I increased her metoprolol  succinate back up to 25 mg every night.  I ordered a 3-day heart monitor to quantify her PVC burden, PVC burden came back 11%.  I referred her to EP service to review frequent PVCs.  She was seen by Dr. Marven Valentine in November 2024 we will started her on mexiletine 150 mg twice a day.  Unfortunately she had no improvement on the mexiletine, she also tried Ranexa  without improvement either.  She was hesitant to start on amiodarone.  Echocardiogram obtained on 10/09/2023 showed EF 55 to 60%, severe MR, severe TR, mild AI.  Mitral regurgitation the tricuspid regurgitation has significantly progressed when compared to previous echocardiogram.  She was seen by Rachel Valentine in February who felt she was asymptomatic therefore plan to repeat echocardiogram in 1 year.  She was last seen by EP service in March 2025 at which time she has not noted any improvement on Ranexa  with her palpitation.  She was found not to be a candidate for PVC ablation given LVOT origin.  Patient presents today for follow-up.  She has occasional chest discomfort however this does not correlate with the degree of exertion and does not feel like her previous anginal symptom from 2020.  She can do heavy activity without any chest discomfort. She still has frequent palpitation which is likely PVCs. EKG today showed NSR with trigeminy.  She has been getting more and more short of breath lately.  She  used to be very active and able to walk 3 miles a day without any issue.  In the recent month, she has been getting short of breath walking shorter and shorter distance.  There were 3 to 4 days in the past month where she has noticed increased leg edema.  She did not have leg edema today on exam.  I am concerned that her symptoms may be related to severe mitral regurgitation.  I will obtain CBC, basic metabolic  panel, TSH and a BNP.  If BNP is elevated, I may give her a dose of diuretic.  However if the blood work is normal, I will discuss with Rachel Valentine to see if he would favor either TEE versus left and right heart cath to initiate workup for possible MitraClip.  I will see the patient back in 3 weeks for reassessment.  ROS:   [name], she also has occasional chest discomfort that does not correlate with physical exertion.  Studies Reviewed: Rachel Valentine   EKG Interpretation Date/Time:  Thursday Jan 08 2024 15:04:44 EDT Ventricular Rate:  78 PR Interval:  148 QRS Duration:  76 QT Interval:  400 QTC Calculation: 456 R Axis:   60  Text Interpretation: Sinus rhythm with frequent Premature ventricular complexes When compared with ECG of 19-Nov-2023 09:57, Premature ventricular complexes are now Present Confirmed by Rachel Valentine [phone]) on 01/08/2024 3:06:43 PM    Cardiac Studies & Procedures   ______________________________________________________________________________________________ CARDIAC CATHETERIZATION  CARDIAC CATHETERIZATION 06/24/2019  Conclusion Images from the original result were not included.   Prox RCA lesion is 99% stenosed.  Prox RCA to Mid RCA lesion is 50% stenosed.  Ost RCA to Prox RCA lesion is 70% stenosed.  Previously placed Prox Cx to Mid Cx stent (unknown type) is widely patent.  Ost Cx to Prox Cx lesion is 40% stenosed.  Mid Cx to Dist Cx lesion is 40% stenosed.  Ost LM to Mid LM lesion is 40% stenosed.  Rachel Valentine is a 83 y.o. female   [id number] LOCATION:  FACILITY: [facility] PHYSICIAN: Rachel Valentine, M.D. 04-06-1941   DATE OF PROCEDURE:  06/24/2019  DATE OF DISCHARGE:     CARDIAC CATHETERIZATION / Attempted PCI RCA    History obtained from chart review.Rachel Valentine is a 83 year old thin appearing Caucasian female status post mid AV groove circumflex tenting by Dr. Addie Valentine in May followed by orbital  atherectomy, PCI and stenting of a diffusely calcified and diseased RCA by Dr. Abel Valentine the following day.  Her LAD is free of significant disease.  She has normal LV function.  She did well after that until recently when she is had accelerated angina which is nitro responsive.  She was seen virtually by Friddie Jetty, NP who arrange for her to undergo outpatient diagnostic coronary angiography today.   PROCEDURE DESCRIPTION:  The patient was brought to the second floor [facility] cath lab in the postabsorptive state.  She was premedicated with IV Versed  and fentanyl .  Her right wristwas prepped and shaved in usual sterile fashion. Xylocaine  1% was used for local anesthesia. A 6 French sheath was inserted into the right radial artery using standard Seldinger technique. The patient received 3000 units  of heparin  intravenously.  A 5 [place] TIG catheter pigtail catheters were used for selective coronary angiography and obtain left heart pressures.  Omnipaque  dye was used for the entirety of the case.  Retrograde aorta, ventricular and pullback pressures were recorded.  Radial cocktail was administered  via the SideArm sheath.  The patient received an additional 3000 units of heparin  with an ACT in the 300 range.  On the pigtail she is for the entirety of the intervention.  Retroaortic pressures monitored to the case.  Patient was already on uninterrupted DAPT with aspirin  and clopidogrel .  Using a 6 French AL 0.75 cm guide catheter along with a 0.14 prowater guidewire I attempted to cross the 99% mid dominant RCA stenosis within the previously placed stent.  The P[place] would not go.  I used a 2 mm balloon as backup and again would not cross the vessel.  I changed to a [employer] and it appeared that the lesion directed the wire outside of the stent into the subintimal space.  I was unable to pass a balloon down to the lesion.  After reviewing the case with Dr. Loetta Ringer the consensus was to terminate  the procedure and get a T CTS consult.  Impression Unsuccessful attempt at RCA PCI and reintervention for aggressive in-stent restenosis in the setting of accelerated angina.  I was unable to cross the disease segment and I suspect the wire was outside of the stented segment in the subintimal space.  Because of this I elected to abort the procedure and have T CTS evaluate for CABG.  The sheath was removed and a TR band was placed on the right wrist to achieve patent hemostasis.  The patient left lab in stable condition.  Clopidogrel  was discontinued.  I will rerestart heparin  4 hours after sheath removal.  Rachel Valentine. MD, [facility] 06/24/2019 2:14 PM  Findings Coronary Findings Diagnostic  Dominance: Right  Left Main Ost LM to Mid LM lesion is 40% stenosed.  Left Circumflex Ost Cx to Prox Cx lesion is 40% stenosed. Previously placed Prox Cx to Mid Cx stent (unknown type) is widely patent. The lesion is located at the bend. Mid Cx to Dist Cx lesion is 40% stenosed.  Right Coronary Artery Ost RCA to Prox RCA lesion is 70% stenosed. Prox RCA lesion is 99% stenosed. The lesion was previously treated. Prox RCA to Mid RCA lesion is 50% stenosed. The lesion was previously treated.  Intervention  No interventions have been documented.   CARDIAC CATHETERIZATION  CARDIAC CATHETERIZATION 01/05/2019  Conclusion  Prox RCA lesion is 50% stenosed.  Prox RCA to Mid RCA lesion is 99% stenosed.  Mid RCA to Dist RCA lesion is 90% stenosed.  A drug-eluting stent was successfully placed using a STENT SYNERGY DES 2.5X38.  Post intervention, there is a 0% residual stenosis.  A drug-eluting stent was successfully placed using a STENT SYNERGY DES 2.75X16.  Post intervention, there is a 0% residual stenosis.  1. Successful orbital atherectomy/PTCA/DES x 2 mid and distal RCA  Continue DAPT with ASA/Plavix  for one year. Continue statin and beta blocker.  Findings Coronary  Findings Diagnostic  Dominance: Right  Right Coronary Artery Prox RCA lesion is 50% stenosed. The lesion is eccentric. Prox RCA to Mid RCA lesion is 99% stenosed. The lesion is calcified. Mid RCA to Dist RCA lesion is 90% stenosed. The lesion is calcified.  Intervention  Prox RCA to Mid RCA lesion Stent Pre-stent angioplasty was performed using a BALLOON SAPPHIRE 2.0X15. A drug-eluting stent was successfully placed using a STENT SYNERGY DES 2.75X16. Stent strut is well apposed. Stent overlaps previously placed stent. Post-stent angioplasty was performed using a BALLOON SAPPHIRE [place] [id number]. Coronary atherectomy of the mid and distal lesion Post-Intervention Lesion Assessment The intervention was successful. Pre-interventional TIMI  flow is 3. Post-intervention TIMI flow is 3. No complications occurred at this lesion. There is a 0% residual stenosis post intervention.  Mid RCA to Dist RCA lesion Stent Pre-stent angioplasty was performed using a BALLOON SAPPHIRE 2.0X15. A drug-eluting stent was successfully placed using a STENT SYNERGY DES 2.5X38. Post-stent angioplasty was performed using a BALLOON SAPPHIRE [place] [id number]. Coronary atherectomy of the mid and distal lesion Post-Intervention Lesion Assessment The intervention was successful. Pre-interventional TIMI flow is 3. Post-intervention TIMI flow is 3. No complications occurred at this lesion. There is a 0% residual stenosis post intervention.     ECHOCARDIOGRAM  ECHOCARDIOGRAM COMPLETE 10/09/2023  Narrative ECHOCARDIOGRAM REPORT    Patient Name:   Rachel Valentine [facility] Date of Exam: 10/09/2023 Medical Rec #:  [id number]                 Height:       61.0 in Accession #:    [id number]                Weight:       112.2 lb Date of Birth:  01-11-1941                 BSA:          1.477 m Patient Age:    82 years                  BP:           175/72 mmHg Patient Gender: F                         HR:           68 bpm. Exam  Location:  [address]  Procedure: 2D Echo, Cardiac Doppler, Color Doppler and Strain Analysis  Indications:    I49.3 PVC's  History:        Patient has prior history of Echocardiogram examinations, most recent 12/10/2022. CAD; Prior CABG.  Sonographer:    Juventino Oppenheim RCS Referring Phys: [id number] SUZANN RIDDLE  IMPRESSIONS   1. Left ventricular ejection fraction, by estimation, is 55 to 60%. The left ventricle has normal function. The left ventricle has no regional wall motion abnormalities. Left ventricular diastolic parameters are indeterminate. The average left ventricular global longitudinal strain is -19.4 %. The global longitudinal strain is normal. 2. Right ventricular systolic function is normal. The right ventricular size is normal. There is mildly elevated pulmonary artery systolic pressure. 3. The mitral valve is abnormal. Severe mitral valve regurgitation. 4. Tricuspid regrugitation best displayed in clip 62. Tricuspid valve regurgitation is severe. 5. The aortic valve is tricuspid. There is moderate calcification of the aortic valve. There is moderate thickening of the aortic valve. Aortic valve regurgitation is mild.  Comparison(s): Prior images reviewed side by side. Mitral and tricuspid regurgitation have worsened. Frequent PVCs noted on rhythm strip of this study.  FINDINGS Left Ventricle: Left ventricular ejection fraction, by estimation, is 55 to 60%. The left ventricle has normal function. The left ventricle has no regional wall motion abnormalities. The average left ventricular global longitudinal strain is -19.4 %. The global longitudinal strain is normal. The left ventricular internal cavity size was normal in size. There is no left ventricular hypertrophy. Left ventricular diastolic parameters are indeterminate.  Right Ventricle: The right ventricular size is normal. No increase in right ventricular wall thickness. Right ventricular systolic function is normal.  There is mildly elevated pulmonary artery systolic pressure.  The tricuspid regurgitant velocity is 3.09 m/s, and with an assumed right atrial pressure of 3 mmHg, the estimated right ventricular systolic pressure is 41.2 mmHg.  Left Atrium: Left atrial size was normal in size.  Right Atrium: Right atrial size was normal in size.  Pericardium: There is no evidence of pericardial effusion.  Mitral Valve: The mitral valve is abnormal. Mild mitral annular calcification. Severe mitral valve regurgitation.  Tricuspid Valve: Tricuspid regrugitation best displayed in clip 62. The tricuspid valve is normal in structure. Tricuspid valve regurgitation is severe. No evidence of tricuspid stenosis.  Aortic Valve: The aortic valve is tricuspid. There is moderate calcification of the aortic valve. There is moderate thickening of the aortic valve. Aortic valve regurgitation is mild. Aortic regurgitation PHT measures 486 msec.  Pulmonic Valve: The pulmonic valve was normal in structure. Pulmonic valve regurgitation is mild. No evidence of pulmonic stenosis.  Aorta: The aortic root, ascending aorta and aortic arch are all structurally normal, with no evidence of dilitation or obstruction and the aortic root and ascending aorta are structurally normal, with no evidence of dilitation.  IAS/Shunts: The atrial septum is grossly normal.   LEFT VENTRICLE PLAX 2D LVIDd:         4.00 cm   Diastology LVIDs:         2.40 cm   LV e' medial:    8.81 cm/s LV PW:         0.80 cm   LV E/e' medial:  15.8 LV IVS:        0.90 cm   LV e' lateral:   13.60 cm/s LVOT diam:     2.00 cm   LV E/e' lateral: 10.2 LV SV:         62 LV SV Index:   42        2D Longitudinal Strain LVOT Area:     3.14 cm  2D Strain GLS (A2C):   -13.1 % 2D Strain GLS (A3C):   -27.1 % 2D Strain GLS (A4C):   -18.1 % 2D Strain GLS Avg:     -19.4 %  RIGHT VENTRICLE RV Basal diam:  3.30 cm RV S prime:     11.30 cm/s TAPSE (M-mode): 1.6 cm RVSP:            41.2 mmHg  LEFT ATRIUM             Index        RIGHT ATRIUM           Index LA diam:        3.70 cm 2.50 cm/m   RA Pressure: 3.00 mmHg LA Vol (A2C):   40.2 ml 27.21 ml/m  RA Area:     11.20 cm LA Vol (A4C):   38.5 ml 26.06 ml/m  RA Volume:   23.90 ml  16.18 ml/m LA Biplane Vol: 41.3 ml 27.96 ml/m AORTIC VALVE LVOT Vmax:   95.00 cm/s LVOT Vmean:  61.000 cm/s LVOT VTI:    0.197 m AI PHT:      486 msec  AORTA Ao Root diam: 3.00 cm Ao Asc diam:  3.50 cm  MITRAL VALVE                  TRICUSPID VALVE MV Area (PHT):                TR Peak grad:   38.2 mmHg MV Decel Time:  TR Vmax:        309.00 cm/s MR Peak grad:    108.2 mmHg   Estimated RAP:  3.00 mmHg MR Mean grad:    81.0 mmHg    RVSP:           41.2 mmHg MR Vmax:         520.00 cm/s MR Vmean:        433.0 cm/s   SHUNTS MR PISA:         2.65 cm     Systemic VTI:  0.20 m MR PISA Eff ROA: 16 mm       Systemic Diam: 2.00 cm MR PISA Radius:  0.65 cm MV E velocity: 139.00 cm/s MV A velocity: 81.20 cm/s MV E/A ratio:  1.71  Gloriann Larger MD Electronically signed by Gloriann Larger MD Signature Date/Time: 10/09/2023/3:52:14 PM    Final   TEE  ECHO INTRAOPERATIVE TEE 06/28/2019  Narrative *INTRAOPERATIVE TRANSESOPHAGEAL REPORT *    Patient Name:   Rachel Valentine [facility] Date of Exam: 06/28/2019 Medical Rec #:  [id number]           Height:       62.0 in Accession #:    [id number]          Weight:       117.0 lb Date of Birth:  1940-12-06           BSA:          1.52 m Patient Age:    78 years            BP:           119/62 mmHg Patient Gender: F                   HR:           87 bpm. Exam Location:  Inpatient  Transesophogeal exam was perform intraoperatively during surgical procedure. Patient was closely monitored under general anesthesia during the entirety of examination.  Indications:     CABG History:         CAD Risk Factors:Hypertension. Performing Phys: [id number] Diagnosing Phys: Ellena Gurney MD  Complications: No known complications during this procedure. POST-OP IMPRESSIONS Overall, there were no significant changes from pre-bypass.  PRE-OP FINDINGS Left Ventricle: The left ventricle has normal systolic function, with an ejection fraction of 55-60%. The cavity size was normal. There is no increase in left ventricular wall thickness.  Right Ventricle: The right ventricle has normal systolic function. The cavity was normal. There is no increase in right ventricular wall thickness.  Left Atrium: Left atrial size was normal in size.  Right Atrium: Right atrial size was normal in size. Right atrial pressure is estimated at 10 mmHg.  Interatrial Septum: No atrial level shunt detected by color flow Doppler.  Pericardium: There is no evidence of pericardial effusion.  Mitral Valve: The mitral valve is normal in structure. Mitral valve regurgitation is mild by color flow Doppler.  Tricuspid Valve: The tricuspid valve was normal in structure. Tricuspid valve regurgitation is trivial by color flow Doppler.  Aortic Valve: The aortic valve is normal in structure. Aortic valve regurgitation is trivial by color flow Doppler. There is no evidence of aortic valve stenosis.  Pulmonic Valve: The pulmonic valve was normal in structure No evidence of pumonic stenosis. Pulmonic valve regurgitation is trivial by color flow Doppler.   Aorta: The aortic root, ascending aorta and aortic arch are normal  in size and structure.   Ellena Gurney MD Electronically signed by Ellena Gurney MD Signature Date/Time: 06/30/2019/11:48:06 AM    Final  MONITORS  LONG TERM MONITOR (3-14 DAYS) 07/09/2023  Narrative Patch Wear Time:  2 days and 20 hours (2024-10-26T21:28:01-399 to 2024-10-29T18:23:11-398)  Patient had a min HR of 50 bpm, max HR of 152 bpm, and avg HR of 68 bpm. Predominant underlying rhythm was Sinus Rhythm. 5 Supraventricular Tachycardia runs  occurred, the run with the fastest interval lasting 4 beats with a max rate of 152 bpm, the longest lasting 7 beats with an avg rate of 102 bpm. Isolated SVEs were rare (<1.0%), SVE Couplets were rare (<1.0%), and SVE Triplets were rare (<1.0%). Isolated VEs were frequent (11.0%, 31045), VE Couplets were rare (<1.0%, 269), and VE Triplets were rare (<1.0%, 9). Ventricular Bigeminy and Trigeminy were present.  SR/SB/ST Short runs of SVT Occasional PACs/ Freq PVSs (11%burden) ROV to discuss       ______________________________________________________________________________________________      Risk Assessment/Calculations:            Physical Exam:   VS:  BP 130/69   Pulse 76   Ht 5\' 1"  (1.549 m)   Wt 110 lb (49.9 kg)   LMP  (LMP Unknown)   SpO2 97%   BMI 20.78 kg/m    Wt Readings from Last 3 Encounters:  01/08/24 110 lb (49.9 kg)  11/19/23 110 lb 9.6 oz (50.2 kg)  10/14/23 110 lb (49.9 kg)    GEN: Well nourished, well developed in no acute distress NECK: No JVD; No carotid bruits CARDIAC: RRR, no murmurs, rubs, gallops RESPIRATORY:  Clear to auscultation without rales, wheezing or rhonchi  ABDOMEN: Soft, non-tender, non-distended EXTREMITIES:  No edema; No deformity   ASSESSMENT AND PLAN: .    Dyspnea on exertion: I am concerned that her symptoms may be related to worsening mitral valve regurgitation.  I will discuss with Dr. Dean Every to see if he would recommend TEE versus left and right heart cath.  I recommended initial blood work including CBC, basic metabolic panel, TSH and BNP.  CAD: History of CABG x 1 with SVG to distal RCA.  She has occasional chest discomfort, however that not correlated with the degree of physical exertion and it feels different from the previous angina.  Palpitation: Previous heart monitor showed 11% PVCs, I referred her to EP service.  She has tried mexiletine and Ranexa  and did not notice any improvement on either therapy.  She has hesitant to  try amiodarone due to side effect.  Severe mitral regurgitation: Seen on echocardiogram in February.  See #1.  Hyperlipidemia: On rosuvastatin        Dispo: Follow-up in 3 weeks for reassessment  Signed, Mance Vallejo, PA

## 2024-01-08 NOTE — Telephone Encounter (Signed)
 Patient identification verified by 2 forms. Hilton Lucky, RN    Called and spoke to patient  Patient states:   -SOB worsening since 2/11 OV with Dr. Katheryne Pane   -takes little activity for her to develop SOB   -has a history of valve issue   -she does not have weight gain  Patient scheduled for OV 5/8 at 1:55 PM  Patient agrees, no further questions at this time

## 2024-01-13 ENCOUNTER — Ambulatory Visit: Payer: Self-pay | Admitting: *Deleted

## 2024-01-13 LAB — CBC
Hematocrit: 40.8 % (ref 34.0–46.6)
Hemoglobin: 13.2 g/dL (ref 11.1–15.9)
MCH: 30.1 pg (ref 26.6–33.0)
MCHC: 32.4 g/dL (ref 31.5–35.7)
MCV: 93 fL (ref 79–97)
Platelets: 285 10*3/uL (ref 150–450)
RBC: 4.39 x10E6/uL (ref 3.77–5.28)
RDW: 12.7 % (ref 11.7–15.4)
WBC: 6 10*3/uL (ref 3.4–10.8)

## 2024-01-13 LAB — BASIC METABOLIC PANEL WITH GFR
BUN/Creatinine Ratio: 17 (ref 12–28)
BUN: 14 mg/dL (ref 8–27)
CO2: 23 mmol/L (ref 20–29)
Calcium: 10.3 mg/dL (ref 8.7–10.3)
Chloride: 102 mmol/L (ref 96–106)
Creatinine, Ser: 0.84 mg/dL (ref 0.57–1.00)
Glucose: 96 mg/dL (ref 70–99)
Potassium: 5.1 mmol/L (ref 3.5–5.2)
Sodium: 140 mmol/L (ref 134–144)
eGFR: 70 mL/min/{1.73_m2} (ref >59)

## 2024-01-13 LAB — TSH: TSH: 3.89 u[IU]/mL (ref 0.450–4.500)

## 2024-01-13 LAB — BRAIN NATRIURETIC PEPTIDE: BNP: 227.8 pg/mL — ABNORMAL HIGH (ref 0.0–100.0)

## 2024-01-14 ENCOUNTER — Other Ambulatory Visit: Payer: Self-pay | Admitting: Physician Assistant

## 2024-01-14 ENCOUNTER — Other Ambulatory Visit: Payer: Self-pay

## 2024-01-14 ENCOUNTER — Telehealth: Payer: Self-pay | Admitting: Physician Assistant

## 2024-01-14 ENCOUNTER — Telehealth: Payer: Self-pay | Admitting: Cardiovascular Disease

## 2024-01-14 DIAGNOSIS — I340 Nonrheumatic mitral (valve) insufficiency: Secondary | ICD-10-CM

## 2024-01-14 DIAGNOSIS — Z79899 Other long term (current) drug therapy: Secondary | ICD-10-CM

## 2024-01-14 MED ORDER — FUROSEMIDE 20 MG PO TABS: 20.0000 mg | ORAL_TABLET | Freq: Every day | ORAL | 3 refills | Status: DC

## 2024-01-14 NOTE — Telephone Encounter (Signed)
  Pt called back to provide her preferred days for an appt for her procedure. She said, her first choice is on Thursday mid morning but she can also come in on a Wednesday, Thursday or Friday

## 2024-01-14 NOTE — Telephone Encounter (Signed)
 Addressed, see other phone note from today for details.

## 2024-01-14 NOTE — Telephone Encounter (Signed)
 I spoke with Dr. Katheryne Pane who recommended both outpatient TEE and left and right cath for severe mitral regurgitation.   I have called and discussed this with the patient including risk and benefit of both procedure, she is agreeable to proceed with both. Please arrange. She already had both BMET and CBC and EKG this past week.   For Transesophageal echocardiogram: [facility] has been requested to perform a transesophageal echocardiogram on Rachel Valentine for severe mitral regurgitation..  After careful review of history and examination, the risks and benefits of transesophageal echocardiogram have been explained including risks of esophageal damage, perforation (1:10,000 risk), bleeding, pharyngeal hematoma as well as other potential complications associated with conscious sedation including aspiration, arrhythmia, respiratory failure and death. Alternatives to treatment were discussed, questions were answered. Patient is willing to proceed.   For Left and right heart catheterization with possible percutaneous coronary intervention: Risk and benefit of procedure explained to the patient who display clear understanding and agree to proceed.  Discussed with patient possible procedural risk include bleeding, vascular injury, renal injury, arrythmia, MI, stroke and loss of limb or life.  Note, since the patient likely will need to be evaluated by one of the structural heart clinic physicians to consider MitraClip in the future, I would recommend to have either Dr. Arnoldo Lapping, Dr. Antoinette Batman, or Dr. Arun Thukkani to be the one to cath the patient.   I have reviewed her lab with her. I will send in a rx of lasix  20mg  daily, however please instruct the patient to hold lasix  for 3 days prior to cath.  [name]

## 2024-01-14 NOTE — Telephone Encounter (Signed)
 Called and spoke to pt about her TEE and Heart Cath patient instructions. Pt verbalized understanding. She will hold Lasix  3 days prior to Cath. TEE and Cath are both scheduled for 01/22/2024 ; TEE (Dr. Maximo Spar) at 1:00 pm and Cath (Dr. Lorie Rook) at 3:00 pm. Cath lab aware that pt will have TEE done prior to Cath, same day.

## 2024-01-15 NOTE — Telephone Encounter (Signed)
 Thank you, I placed order last night.

## 2024-01-16 NOTE — Telephone Encounter (Signed)
 LVM at [phone] regarding clarification on Lasix  instructions. Pt should take her Lasix  20 mg on Sunday 01/18/2024, which should be her LAST dose prior to scheduled TEE and Heart Cath on 01/22/2024. This medication should be held 3 days prior to procedure per Hao Meng, PA.

## 2024-01-20 ENCOUNTER — Telehealth: Payer: Self-pay | Admitting: *Deleted

## 2024-01-20 NOTE — Telephone Encounter (Addendum)
 Cardiac Catheterization scheduled at [facility] for: Thursday Jan 22, 2024 3 PM/TEE 1 PM Arrival time [facility] Main Entrance A at: 12 Noon  Nothing to eat or drink after midnight prior to procedures.  Medication instructions: -Hold:  Lasix -hold 3 days before procedure per Hao Meng, PA-last dose 01/18/24 until post procedure -Other usual morning medications can be taken with sips of water  including aspirin  81 mg.  Plan to go home the same day, you will only stay overnight if medically necessary.  You must have responsible adult to drive you home.  Someone must be with you the first 24 hours after you arrive home.  Reviewed procedure instructions with patient.

## 2024-01-21 NOTE — Progress Notes (Signed)
 Spoke to patient and instructed them to come at 1100  and to be NPO after 0000.  Medications reviewed.    Confirmed that patient will have a ride home and someone to stay with them for 24 hours after the procedure.

## 2024-01-22 ENCOUNTER — Other Ambulatory Visit: Payer: Self-pay

## 2024-01-22 ENCOUNTER — Encounter (HOSPITAL_COMMUNITY)
Admission: RE | Disposition: A | Discharge status: Home / Self Care | Payer: Self-pay | Source: Home / Self Care | Attending: Internal Medicine

## 2024-01-22 ENCOUNTER — Ambulatory Visit (HOSPITAL_COMMUNITY): Admitting: Anesthesiology

## 2024-01-22 ENCOUNTER — Ambulatory Visit (HOSPITAL_COMMUNITY)
Admission: RE | Admit: 2024-01-22 | Discharge: 2024-01-22 | Disposition: A | Discharge status: Home / Self Care | Attending: Internal Medicine | Admitting: Internal Medicine

## 2024-01-22 ENCOUNTER — Ambulatory Visit (HOSPITAL_COMMUNITY)
Admission: RE | Admit: 2024-01-22 | Discharge: 2024-01-22 | Disposition: A | Discharge status: Home / Self Care | Source: Ambulatory Visit | Attending: Physician Assistant | Admitting: Physician Assistant

## 2024-01-22 ENCOUNTER — Ambulatory Visit (HOSPITAL_BASED_OUTPATIENT_CLINIC_OR_DEPARTMENT_OTHER): Admitting: Anesthesiology

## 2024-01-22 DIAGNOSIS — I25110 Atherosclerotic heart disease of native coronary artery with unstable angina pectoris: Secondary | ICD-10-CM | POA: Insufficient documentation

## 2024-01-22 DIAGNOSIS — I2510 Atherosclerotic heart disease of native coronary artery without angina pectoris: Secondary | ICD-10-CM

## 2024-01-22 DIAGNOSIS — R0609 Other forms of dyspnea: Secondary | ICD-10-CM | POA: Diagnosis not present

## 2024-01-22 DIAGNOSIS — I340 Nonrheumatic mitral (valve) insufficiency: Secondary | ICD-10-CM

## 2024-01-22 DIAGNOSIS — I10 Essential (primary) hypertension: Secondary | ICD-10-CM | POA: Insufficient documentation

## 2024-01-22 DIAGNOSIS — I083 Combined rheumatic disorders of mitral, aortic and tricuspid valves: Secondary | ICD-10-CM | POA: Insufficient documentation

## 2024-01-22 DIAGNOSIS — I2582 Chronic total occlusion of coronary artery: Secondary | ICD-10-CM | POA: Insufficient documentation

## 2024-01-22 DIAGNOSIS — E785 Hyperlipidemia, unspecified: Secondary | ICD-10-CM | POA: Diagnosis not present

## 2024-01-22 DIAGNOSIS — Z87891 Personal history of nicotine dependence: Secondary | ICD-10-CM | POA: Diagnosis not present

## 2024-01-22 DIAGNOSIS — Z955 Presence of coronary angioplasty implant and graft: Secondary | ICD-10-CM | POA: Diagnosis not present

## 2024-01-22 DIAGNOSIS — Z951 Presence of aortocoronary bypass graft: Secondary | ICD-10-CM | POA: Diagnosis not present

## 2024-01-22 DIAGNOSIS — Z79899 Other long term (current) drug therapy: Secondary | ICD-10-CM | POA: Insufficient documentation

## 2024-01-22 LAB — POCT I-STAT 7, (LYTES, BLD GAS, ICA,H+H)
Acid-base deficit: 3 mmol/L — ABNORMAL HIGH (ref 0.0–2.0)
Bicarbonate: 22.7 mmol/L (ref 20.0–28.0)
Calcium, Ion: 1.2 mmol/L (ref 1.15–1.40)
HCT: 35 % — ABNORMAL LOW (ref 36.0–46.0)
Hemoglobin: 11.9 g/dL — ABNORMAL LOW (ref 12.0–15.0)
O2 Saturation: 99 %
Potassium: 3.7 mmol/L (ref 3.5–5.1)
Sodium: 141 mmol/L (ref 135–145)
TCO2: 24 mmol/L (ref 22–32)
pCO2 arterial: 40.5 mmHg (ref 32–48)
pH, Arterial: 7.357 (ref 7.35–7.45)
pO2, Arterial: 121 mmHg — ABNORMAL HIGH (ref 83–108)

## 2024-01-22 LAB — POCT I-STAT EG7
Acid-base deficit: 2 mmol/L (ref 0.0–2.0)
Acid-base deficit: 2 mmol/L (ref 0.0–2.0)
Acid-base deficit: 4 mmol/L — ABNORMAL HIGH (ref 0.0–2.0)
Bicarbonate: 23.8 mmol/L (ref 20.0–28.0)
Bicarbonate: 19.8 mmol/L — ABNORMAL LOW (ref 20.0–28.0)
Bicarbonate: 24.2 mmol/L (ref 20.0–28.0)
Calcium, Ion: 1.22 mmol/L (ref 1.15–1.40)
Calcium, Ion: 1.05 mmol/L — ABNORMAL LOW (ref 1.15–1.40)
Calcium, Ion: 1.21 mmol/L (ref 1.15–1.40)
HCT: 36 % (ref 36.0–46.0)
HCT: 31 % — ABNORMAL LOW (ref 36.0–46.0)
HCT: 36 % (ref 36.0–46.0)
Hemoglobin: 12.2 g/dL (ref 12.0–15.0)
Hemoglobin: 10.5 g/dL — ABNORMAL LOW (ref 12.0–15.0)
Hemoglobin: 12.2 g/dL (ref 12.0–15.0)
O2 Saturation: 74 %
O2 Saturation: 100 %
O2 Saturation: 75 %
Potassium: 3.7 mmol/L (ref 3.5–5.1)
Potassium: 3.4 mmol/L — ABNORMAL LOW (ref 3.5–5.1)
Potassium: 3.7 mmol/L (ref 3.5–5.1)
Sodium: 141 mmol/L (ref 135–145)
Sodium: 142 mmol/L (ref 135–145)
Sodium: 143 mmol/L (ref 135–145)
TCO2: 25 mmol/L (ref 22–32)
TCO2: 21 mmol/L — ABNORMAL LOW (ref 22–32)
TCO2: 26 mmol/L (ref 22–32)
pCO2, Ven: 45.1 mmHg (ref 44–60)
pCO2, Ven: 30.5 mmHg — ABNORMAL LOW (ref 44–60)
pCO2, Ven: 46.2 mmHg (ref 44–60)
pH, Ven: 7.331 (ref 7.25–7.43)
pH, Ven: 7.327 (ref 7.25–7.43)
pH, Ven: 7.419 (ref 7.25–7.43)
pO2, Ven: 42 mmHg (ref 32–45)
pO2, Ven: 243 mmHg — ABNORMAL HIGH (ref 32–45)
pO2, Ven: 43 mmHg (ref 32–45)

## 2024-01-22 LAB — ECHO TEE
AR max vel: 1.12 cm2
AV Area VTI: 0.98 cm2
AV Area mean vel: 1.05 cm2
AV Mean grad: 12 mmHg
AV Peak grad: 21.3 mmHg
Ao pk vel: 2.31 m/s

## 2024-01-22 LAB — POCT ACTIVATED CLOTTING TIME
Activated Clotting Time: 216 s
Activated Clotting Time: 193 s
Activated Clotting Time: 164 s

## 2024-01-22 SURGERY — RIGHT/LEFT HEART CATH AND CORONARY/GRAFT ANGIOGRAPHY
Anesthesia: LOCAL

## 2024-01-22 SURGERY — TRANSESOPHAGEAL ECHOCARDIOGRAM (TEE) (CATHLAB)
Anesthesia: Monitor Anesthesia Care

## 2024-01-22 MED ORDER — ONDANSETRON HCL 4 MG/2ML IJ SOLN: 4.0000 mg | Freq: Four times a day (QID) | INTRAMUSCULAR | Status: DC | PRN

## 2024-01-22 MED ORDER — HYDRALAZINE HCL 20 MG/ML IJ SOLN: 10.0000 mg | INTRAMUSCULAR | Status: DC | PRN

## 2024-01-22 MED ORDER — MIDAZOLAM HCL 2 MG/2ML IJ SOLN
INTRAMUSCULAR | Status: AC
  Filled 2024-01-22: qty 2

## 2024-01-22 MED ORDER — HEPARIN SODIUM (PORCINE) 1000 UNIT/ML IJ SOLN
INTRAMUSCULAR | Status: AC
  Filled 2024-01-22: qty 10

## 2024-01-22 MED ORDER — LABETALOL HCL 5 MG/ML IV SOLN: 10.0000 mg | INTRAVENOUS | Status: DC | PRN

## 2024-01-22 MED ORDER — LIDOCAINE HCL (PF) 1 % IJ SOLN
INTRAMUSCULAR | Status: AC
  Filled 2024-01-22: qty 30

## 2024-01-22 MED ORDER — LIDOCAINE 2% (20 MG/ML) 5 ML SYRINGE
INTRAMUSCULAR | Status: DC | PRN
  Administered 2024-01-22: 50 mg via INTRAVENOUS

## 2024-01-22 MED ORDER — FENTANYL CITRATE (PF) 100 MCG/2ML IJ SOLN
INTRAMUSCULAR | Status: AC
  Filled 2024-01-22: qty 2

## 2024-01-22 MED ORDER — FUROSEMIDE 10 MG/ML IJ SOLN: 40.0000 mg | Freq: Once | INTRAMUSCULAR | Status: DC

## 2024-01-22 MED ORDER — PHENYLEPHRINE 80 MCG/ML (10ML) SYRINGE FOR IV PUSH (FOR BLOOD PRESSURE SUPPORT)
PREFILLED_SYRINGE | INTRAVENOUS | Status: DC | PRN
Start: 2024-01-22 — End: 2024-01-22
  Administered 2024-01-22: 40 ug via INTRAVENOUS

## 2024-01-22 MED ORDER — SODIUM CHLORIDE 0.9% FLUSH
3.0000 mL | INTRAVENOUS | Status: DC | PRN
Start: 2024-01-22 — End: 2024-01-22

## 2024-01-22 MED ORDER — HEPARIN (PORCINE) IN NACL 1000-0.9 UT/500ML-% IV SOLN
INTRAVENOUS | Status: DC | PRN
  Administered 2024-01-22: 2000 mL

## 2024-01-22 MED ORDER — LIDOCAINE HCL (PF) 1 % IJ SOLN
INTRAMUSCULAR | Status: DC | PRN
  Administered 2024-01-22: 2 mL
  Administered 2024-01-22 (×2): 5 mL

## 2024-01-22 MED ORDER — IOHEXOL 350 MG/ML SOLN
INTRAVENOUS | Status: DC | PRN
  Administered 2024-01-22: 60 mL

## 2024-01-22 MED ORDER — SODIUM CHLORIDE 0.9 % IV SOLN: INTRAVENOUS | Status: DC

## 2024-01-22 MED ORDER — HEPARIN SODIUM (PORCINE) 1000 UNIT/ML IJ SOLN
INTRAMUSCULAR | Status: DC | PRN
  Administered 2024-01-22: 5000 [IU] via INTRAVENOUS

## 2024-01-22 MED ORDER — MIDAZOLAM HCL 2 MG/2ML IJ SOLN
INTRAMUSCULAR | Status: DC | PRN
  Administered 2024-01-22: 1 mg via INTRAVENOUS

## 2024-01-22 MED ORDER — ASPIRIN 81 MG PO CHEW: 81.0000 mg | CHEWABLE_TABLET | ORAL | Status: DC

## 2024-01-22 MED ORDER — PROPOFOL 10 MG/ML IV BOLUS
INTRAVENOUS | Status: DC | PRN
  Administered 2024-01-22: 80 ug/kg/min via INTRAVENOUS
  Administered 2024-01-22: 40 mg via INTRAVENOUS

## 2024-01-22 MED ORDER — VERAPAMIL HCL 2.5 MG/ML IV SOLN
INTRAVENOUS | Status: DC | PRN
  Administered 2024-01-22: 10 mL via INTRA_ARTERIAL

## 2024-01-22 MED ORDER — VERAPAMIL HCL 2.5 MG/ML IV SOLN
INTRAVENOUS | Status: AC
  Filled 2024-01-22: qty 2

## 2024-01-22 MED ORDER — FENTANYL CITRATE (PF) 100 MCG/2ML IJ SOLN
INTRAMUSCULAR | Status: DC | PRN
  Administered 2024-01-22: 25 ug via INTRAVENOUS

## 2024-01-22 MED ORDER — SODIUM CHLORIDE 0.9% FLUSH: 3.0000 mL | Freq: Two times a day (BID) | INTRAVENOUS | Status: DC

## 2024-01-22 SURGICAL SUPPLY — 18 items
CATH INFINITI 6F ANG MULTIPACK (CATHETERS) IMPLANT
CATH INFINITI 6F FL3.5 (CATHETERS) IMPLANT
CATH LANGSTON DUAL LUM PIG 6FR (CATHETERS) IMPLANT
CATH SWAN GANZ 7F STRAIGHT (CATHETERS) IMPLANT
DEVICE RAD TR BAND REGULAR (VASCULAR PRODUCTS) IMPLANT
GLIDESHEATH SLEND SS 6F .021 (SHEATH) IMPLANT
GLIDESHEATH SLENDER 7FR .021G (SHEATH) IMPLANT
PACK CARDIAC CATHETERIZATION (CUSTOM PROCEDURE TRAY) ×1 IMPLANT
SET ATX-X65L (MISCELLANEOUS) IMPLANT
SHEATH PINNACLE 7F 10CM (SHEATH) IMPLANT
SHEATH PROBE COVER 6X72 (BAG) IMPLANT
TRANSDUCER W/STOPCOCK (MISCELLANEOUS) IMPLANT
TUBING ART PRESS 72 MALE/FEM (TUBING) IMPLANT
WIRE EMERALD 3MM-J .025X260CM (WIRE) IMPLANT
WIRE EMERALD 3MM-J .035X260CM (WIRE) IMPLANT
WIRE EMERALD ST .035X260CM (WIRE) IMPLANT
WIRE MICRO SET SILHO 5FR 7 (SHEATH) IMPLANT
WIRE MICROINTRODUCER 60CM (WIRE) IMPLANT

## 2024-01-22 NOTE — Progress Notes (Signed)
  Echocardiogram Echocardiogram Transesophageal has been performed.  Farley Honer, RDCS 01/22/2024, 1:12 PM

## 2024-01-22 NOTE — Interval H&P Note (Signed)
 History and Physical Interval Note:  01/22/2024 11:38 AM  Lani Pique Shanafelt  has presented today for surgery, with the diagnosis of mr.  The various methods of treatment have been discussed with the patient and family. After consideration of risks, benefits and other options for treatment, the patient has consented to  Procedure(s): RIGHT/LEFT HEART CATH AND CORONARY/GRAFT ANGIOGRAPHY (N/A) as a surgical intervention.  The patient's history has been reviewed, patient examined, no change in status, stable for surgery.  I have reviewed the patient's chart and labs.  Questions were answered to the patient's satisfaction.     Catalaya Garr K Urania Pearlman

## 2024-01-22 NOTE — Interval H&P Note (Signed)
 History and Physical Interval Note:  01/22/2024 11:58 AM  Rachel Valentine  has presented today for surgery, with the diagnosis of MR.  The various methods of treatment have been discussed with the patient and family. After consideration of risks, benefits and other options for treatment, the patient has consented to  Procedure(s): TRANSESOPHAGEAL ECHOCARDIOGRAM (N/A) as a surgical intervention.  The patient's history has been reviewed, patient examined, no change in status, stable for surgery.  I have reviewed the patient's chart and labs.  Questions were answered to the patient's satisfaction.     Hazle Lites

## 2024-01-22 NOTE — Progress Notes (Addendum)
 Site Area: right femoral (venous)  Site prior to removal: clean, dry, intact, level 0  Pressure applied for: 10 minutes   Bedrest beginning at: 1715  Manual: yes   Patient status during pull: stable   Post pull groin site: clean, dry, intact, level 0  Post pull instructions given: yes   Dressing applied: gauze and tegaderm   Comments:

## 2024-01-22 NOTE — Progress Notes (Signed)
 Patient and daughter was given discharge instructions. Both verbalized understanding.

## 2024-01-22 NOTE — Anesthesia Preprocedure Evaluation (Signed)
 Anesthesia Evaluation  Patient identified by MRN, date of birth, ID band Patient awake    Reviewed: Allergy & Precautions, NPO status , Patient's Chart, lab work & pertinent test results  History of Anesthesia Complications (+) PONV and history of anesthetic complications  Airway Mallampati: II  TM Distance: >3 FB Neck ROM: Full    Dental no notable dental hx.    Pulmonary neg pulmonary ROS, former smoker   Pulmonary exam normal        Cardiovascular hypertension, + CAD, + Cardiac Stents and + CABG  + Valvular Problems/Murmurs MR  Rhythm:Regular Rate:Normal + Systolic murmurs    Neuro/Psych negative neurological ROS  negative psych ROS   GI/Hepatic Neg liver ROS,GERD  ,,  Endo/Other  negative endocrine ROS    Renal/GU CRFRenal disease  negative genitourinary   Musculoskeletal negative musculoskeletal ROS (+)    Abdominal Normal abdominal exam  (+)   Peds  Hematology  (+) Blood dyscrasia, anemia   Anesthesia Other Findings   Reproductive/Obstetrics                             Anesthesia Physical Anesthesia Plan  ASA: 3  Anesthesia Plan: MAC   Post-op Pain Management:    Induction: Intravenous  PONV Risk Score and Plan: 3 and Ondansetron , Dexamethasone  and Treatment may vary due to age or medical condition  Airway Management Planned: Simple Face Mask and Nasal Cannula  Additional Equipment: None  Intra-op Plan:   Post-operative Plan:   Informed Consent: I have reviewed the patients History and Physical, chart, labs and discussed the procedure including the risks, benefits and alternatives for the proposed anesthesia with the patient or authorized representative who has indicated his/her understanding and acceptance.     Dental advisory given  Plan Discussed with: CRNA  Anesthesia Plan Comments:        Anesthesia Quick Evaluation

## 2024-01-22 NOTE — CV Procedure (Signed)
 TRANSESOPHAGEAL ECHOCARDIOGRAM (TEE) NOTE  INDICATIONS: Mitral valve regurgitation  PROCEDURE:   Informed consent was obtained prior to the procedure. The risks, benefits and alternatives for the procedure were discussed and the patient comprehended these risks.  Risks include, but are not limited to, cough, sore throat, vomiting, nausea, somnolence, esophageal and stomach trauma or perforation, bleeding, low blood pressure, aspiration, pneumonia, infection, trauma to the teeth and death.    After a procedural time-out, the patient was given propofol  for sedation by anesthesia. See their separate report.  The patient's heart rate, blood pressure, and oxygen saturation are monitored continuously during the procedure.The oropharynx was anesthetized with topical cetacaine.  The transesophageal probe was inserted in the esophagus and stomach without difficulty and multiple views were obtained.  The patient was kept under observation until the patient left the procedure room.  I was present face-to-face 100% of this time. The patient left the procedure room in stable condition.   Agitated microbubble saline contrast was not administered.  COMPLICATIONS:    There were no immediate complications.  Findings:  LEFT VENTRICLE: The left ventricular wall thickness is normal.  The left ventricular cavity is normal in size. Wall motion is normal.  LVEF is 60-65%.  RIGHT VENTRICLE:  The right ventricle is normal in structure and function without any thrombus or masses.    LEFT ATRIUM:  The left atrium is mildly dilated in size without any thrombus or masses.  There is not spontaneous echo contrast ("smoke") in the left atrium consistent with a low flow state.  LEFT ATRIAL APPENDAGE:  The left atrial appendage is free of any thrombus or masses. The appendage has single lobes. Pulse doppler indicates high flow in the appendage.  ATRIAL SEPTUM:  The atrial septum appears intact and is free of thrombus  and/or masses.  There is no evidence for interatrial shunting by color doppler.  RIGHT ATRIUM:  The right atrium is normal in size and function without any thrombus or masses.  MITRAL VALVE:  The mitral valve is degenerative with moderate calcification and reduced excursion of the posterior leaflet. There is  mild to moderate regurgitation with multiple jets.  There were no vegetations or stenosis. Well visualized with 2D/3D modes. Vena contracta of 0.3 mm, the MR radius was 0.4 cm.   AORTIC VALVE:  The aortic valve is moderately calcified with restricted leaflet motion. There is probably moderate aortic stenosis - mean gradient 12.0 mmHg with Mild regurgitation. DI is 0.35. AVA by planimetry was 1.03 cm2 and by VTI was 0.98 cm2. There were no vegetations.  TRICUSPID VALVE:  The tricuspid valve is normal in structure and function with mild to moderate regurgitation.  There were no vegetations or stenosis   PULMONIC VALVE:  The pulmonic valve is normal in structure and function with trivial regurgitation.  There were no vegetations or stenosis.   AORTIC ARCH, ASCENDING AND DESCENDING AORTA:  There was grade 1 Ardell Koller et. Al, 1992) atherosclerosis of the ascending aortic arch and proximal descending aorta.  12. PULMONARY VEINS: Anomalous pulmonary venous return was not noted.  13. PERICARDIUM: The pericardium appeared normal and non-thickened.  There is no pericardial effusion.  IMPRESSION:   Mild to moderate mitral regurgitation with multiple jets - probably related to posterior leaflet restriction and calcification. Moderate AS - mean gradient 12.0 mmHg, calcified leaflets with mild AI, DI is 0.35, AVA by planimetry was 1.03 cm2, VTI 0.98 cm2, LVOT diameter 1.9 cm. Mild to moderate TR Mild LAE LVEF  55-60%, normal wall motion  RECOMMENDATIONS:    Plan to continue with left and right heart catheterization with Dr. Lorie Rook this afternoon. Case discussed and images reviewed with him and he  will plan to cross the aortic valve and evaluate this further.  Time Spent Directly with the Patient:  60 minutes   Hazle Lites, MD, [facility] [place], FNLA, FACP  [place]  [facility]  Medical Director of the [facility] Lipid [facility] &  Cardiovascular Risk Reduction Clinic Diplomate of the American Board of Clinical Lipidology Attending Cardiologist  Direct Dial: [phone]  Fax: [phone]  Website:  [email or web address] Sanger Mellina Benison 01/22/2024, 1:02 PM

## 2024-01-22 NOTE — Discharge Instructions (Addendum)

## 2024-01-22 NOTE — Transfer of Care (Signed)
 Immediate Anesthesia Transfer of Care Note  [name]  Procedure(s) Performed: TRANSESOPHAGEAL ECHOCARDIOGRAM  Patient Location: PACU  Anesthesia Type:MAC  Level of Consciousness: awake, alert , and oriented  Airway & Oxygen Therapy: Patient Spontanous Breathing and Patient connected to face mask oxygen  Post-op Assessment: Report given to RN and Post -op Vital signs reviewed and stable  Post vital signs: Reviewed and stable  Last Vitals:  Vitals Value Taken Time  BP 128/54 01/22/24 1300  Temp    Pulse 68 01/22/24 1301  Resp 18 01/22/24 1301  SpO2 96 % 01/22/24 1301  Vitals shown include unfiled device data.  Last Pain:  Vitals:   01/22/24 1300  TempSrc:   PainSc: 0-No pain         Complications: No notable events documented.

## 2024-01-23 ENCOUNTER — Encounter (HOSPITAL_COMMUNITY): Payer: Self-pay | Admitting: Internal Medicine

## 2024-01-25 NOTE — Anesthesia Postprocedure Evaluation (Signed)
 Anesthesia Post Note  [name]  Procedure(s) Performed: TRANSESOPHAGEAL ECHOCARDIOGRAM     Patient location during evaluation: PACU Anesthesia Type: MAC Level of consciousness: awake and alert Pain management: pain level controlled Vital Signs Assessment: post-procedure vital signs reviewed and stable Respiratory status: spontaneous breathing, nonlabored ventilation, respiratory function stable and patient connected to nasal cannula oxygen Cardiovascular status: stable and blood pressure returned to baseline Postop Assessment: no apparent nausea or vomiting Anesthetic complications: no   No notable events documented.  Last Vitals:  Vitals:   01/22/24 1830 01/22/24 1845  BP:    Pulse: 71 70  Resp:    Temp:    SpO2: 96% 96%    Last Pain:  Vitals:   01/22/24 1531  TempSrc:   PainSc: 0-No pain                 [name]

## 2024-01-28 ENCOUNTER — Ambulatory Visit: Attending: Physician Assistant | Admitting: Physician Assistant

## 2024-01-28 ENCOUNTER — Encounter: Payer: Self-pay | Admitting: Physician Assistant

## 2024-01-28 VITALS — BP 146/60 | HR 60 | Ht 62.0 in | Wt 108.2 lb

## 2024-01-28 DIAGNOSIS — I2510 Atherosclerotic heart disease of native coronary artery without angina pectoris: Secondary | ICD-10-CM | POA: Diagnosis not present

## 2024-01-28 DIAGNOSIS — E785 Hyperlipidemia, unspecified: Secondary | ICD-10-CM

## 2024-01-28 DIAGNOSIS — I493 Ventricular premature depolarization: Secondary | ICD-10-CM

## 2024-01-28 DIAGNOSIS — R0609 Other forms of dyspnea: Secondary | ICD-10-CM | POA: Diagnosis not present

## 2024-01-28 DIAGNOSIS — Z9861 Coronary angioplasty status: Secondary | ICD-10-CM

## 2024-01-28 DIAGNOSIS — I340 Nonrheumatic mitral (valve) insufficiency: Secondary | ICD-10-CM

## 2024-01-28 MED ORDER — FUROSEMIDE 20 MG PO TABS: 20.0000 mg | ORAL_TABLET | ORAL | Status: AC | PRN

## 2024-01-28 NOTE — Patient Instructions (Signed)
 Medication Instructions:  TAKE LASIX  AS NEEDED  DECREASE METOPROLOL  SUCCINATE TO 12.5 MG DAILY *If you need a refill on your cardiac medications before your next appointment, please call your pharmacy*  Lab Work: NO LABS If you have labs (blood work) drawn today and your tests are completely normal, you will receive your results only by: MyChart Message (if you have MyChart) OR A paper copy in the mail If you have any lab test that is abnormal or we need to change your treatment, we will call you to review the results.  Testing/Procedures: NO TESTING  Follow-Up: At [facility], you and your health needs are our priority.  As part of our continuing mission to provide you with exceptional heart care, our providers are all part of one team.  This team includes your primary Cardiologist (physician) and Advanced Practice Providers or APPs (Physician Assistants and Nurse Practitioners) who all work together to provide you with the care you need, when you need it.  Your next appointment:   2-4 month(s)  Provider:   Lauro Portal, MD

## 2024-01-28 NOTE — Progress Notes (Unsigned)
 Cardiology Office Note:  .   Date:  01/30/2024  ID:  Rachel Valentine, DOB 03-25-1941, MRN [id number] PCP: Ransom Byers, MD  [facility] Providers Cardiologist:  Lauro Portal, MD Electrophysiologist:  Boyce Byes, MD     History of Present Illness: .   Rachel Valentine is a 83 y.o. female with PMH of hyperlipidemia, palpitation, frequent PVCs, mitral regurgitation and CAD.  She has never had a heart attack or stroke.  She was admitted at [facility] in May 2020 with unstable angina.  Enzyme was negative.  Subsequent cardiac catheterization on 01/04/2019 showed normal LV function, 95% proximal to mid left circumflex lesion treated with Resolute Onyx 2.5 x 12 mm DES, 80% mid to distal left circumflex lesion treated with balloon angioplasty with 30% residual stenosis, 99% proximal RCA lesion followed by 80% mid RCA lesion, EF 55 to 65%.  Patient underwent staged PCI on 01/05/2019 with successful atherectomy and placement of 2 DES to mid to distal RCA.  Unfortunately she developed a in-stent restenosis of the previously placed RCA stent in October 2020, Dr. Katheryne Pane attempted to cross the lesion unsuccessfully.  Based on this, she ultimately underwent CABG x 1 with SVG to distal RCA by Dr. Sherene Dilling during the same hospitalization.  She later developed breast cancer and finished chemo and radiation therapy.  She also had gross hematuria and was seen by Dr. Inga Manges of [facility] service.  In 2023, she developed symptomatic palpitation which began after her Prolia  shot in February, this was one of the known side effect of the medication.  Echocardiogram obtained on 11/07/2021 showed normal EF, grade 2 DD, moderate MR and moderate AI.  Heart monitor showed frequent PVCs often in trigeminal pattern with occasional couplet and triplet as well as SVT associated with symptoms.  Her beta-blocker was increased which resulted in improvement of her symptoms, but later cut back  due to dizziness.  I last saw the patient in October 2024 at which time her dizziness has improved, however palpitation worsened.  I increased her metoprolol  succinate back up to 25 mg every night.  I ordered a 3-day heart monitor to quantify her PVC burden, PVC burden came back 11%.  I referred her to EP service to review frequent PVCs.  She was seen by Dr. Marven Slimmer in November 2024 who started her on mexiletine 150 mg twice a day.  Unfortunately she had no improvement on the mexiletine, she also tried Ranexa  without improvement either.  She was hesitant to start on amiodarone.  Echocardiogram obtained on 10/09/2023 showed EF 55 to 60%, severe MR, severe TR, mild AI.  Mitral regurgitation and tricuspid regurgitation has significantly progressed when compared to previous echocardiogram.  She was seen by Dr. Katheryne Pane in February who felt she was asymptomatic therefore plan to repeat echocardiogram in 1 year.  She was last seen by EP service in March 2025 at which time she has not noted any improvement on Ranexa  with her palpitation.  She was found not to be a candidate for PVC ablation given LVOT origin.   I last saw the patient on 01/08/2024 at which time she has occasional chest discomfort that does not correlate with physical activity and it did not feel like her previous anginal symptom from 2020.  She has frequent palpitation which was likely PVCs.  EKG showed sinus rhythm with trigeminy pattern.  One of the major concern she had at that time was that she was getting more and more  short of breath was less activity.  She used to be quite active and was able to walk 3 miles without any issue, however in the recent month, she gets short of breath with much less activity.  I was concerned that her symptoms may be related to severe mitral regurgitation.  I discussed her case with Dr. Dean Every who recommended TEE and left and right heart cath to further assess.  TEE performed on 01/22/2024 showed EF 60 to 65%, RVSP 38.8 mmHg,  mild to moderate MR, mild to moderate TR, mild AI, moderate aortic stenosis.  Second left and right heart cath performed on 01/22/2024 showed 100% proximal RCA occlusion, 70% ostial to proximal RCA disease, 70% RP DA lesion, 80% distal RCA lesion, patent SVG to PDA.  Wedge pressure was elevated at 24 mmHg, she received a single dose of 40 mg IV Lasix .  Based on invasive measure, aortic stenosis was only moderate.  Since TEE did not suggest a severe MR or TR. given discrepant finding, Dr. Lorie Rook recommended to consider cardiac MRI as outpatient to further assess.  Patient presents today for follow-up.  She continues to have shortness of breath with activity.  She feels fatigued.  We ambulated her in the hallway, after 2 laps around the office, her O2 saturation remained 97-98%, however heart rate stayed around 50 to 60s.  In fact, during ambulation, her heart rate dipped slightly rather than increased.  I will decrease her metoprolol  succinate down to 12.5 mg daily to avoid chronotropic incompetence.  Hopefully this will give her more energy back and better exercise tolerance.  She continued to have frequent palpitation related to PVCs, we discussed possibility of amiodarone again, she is concerned about side effect and does not wish to consider this.  I will discuss with one of our imager to see the benefit of cardiac MRI as recommended by Dr. Lorie Rook.  She appears to be euvolemic, given dizziness, I recommended change Lasix  to as needed.  She can follow-up with Dr. Katheryne Pane in 2 to 4 months.  Addendum: I reviewed the patient's case with Dr. Rolm Clos, [facility] MRI will give us  more information regarding the degree of aortic stenosis and mitral regurgitation.  I have called and discussed with the patient who is agreeable to proceed.  ROS:   She denies chest pain, palpitations, pnd, orthopnea, n, v, dizziness, syncope, edema, weight gain, or early satiety. All other systems reviewed and are otherwise negative except  as noted above.    Studies Reviewed: .        Cardiac Studies & Procedures   ______________________________________________________________________________________________ CARDIAC CATHETERIZATION  CARDIAC CATHETERIZATION 01/22/2024  Conclusion   Ost LM to Mid LM lesion is 20% stenosed.   Ost Cx to Prox Cx lesion is 40% stenosed.   Mid Cx to Dist Cx lesion is 40% stenosed.   Prox RCA lesion is 100% stenosed.   Ost RCA to Prox RCA lesion is 70% stenosed.   RPDA lesion is 70% stenosed.   Dist RCA lesion is 80% stenosed.   Non-stenotic Prox Cx to Mid Cx lesion was previously treated.  1.  Patent vein graft to PDA; the native right coronary artery is occluded. 2.  Patent left circumflex stent with mild to moderate disease elsewhere. 3.  Fick cardiac output of 4.9 L/min and Fick cardiac index of 3.3 L/min/m with the following hemodynamics: Right atrial pressure mean of 14 mmHg with V waves to 19 mmHg Right ventricular pressure 51/17 with an end-diastolic pressure of 18  mmHg PA pressure 48/28 with a mean of 34 mmHg Wedge pressure of 24 mmHg with V waves to 26 mmHg PVR of 2.0 Woods units PA pulsatility index of 1.43 4.  Mean invasive aortic valve gradient measured by Bethany Broom catheter of 12.7 mmHg  Recommendation: Given elevated wedge pressure, Lasix  40 mg IV x 1 was administered.  Only moderate aortic stenosis seen by invasive measurements.  TEE did not suggest severe mitral or tricuspid regurgitation.  Given discrepant findings when compared with the results of the TTE, consider CMR to evaluate further.  Findings Coronary Findings Diagnostic  Dominance: Right  Left Main Ost LM to Mid LM lesion is 20% stenosed.  Left Circumflex Ost Cx to Prox Cx lesion is 40% stenosed. Non-stenotic Prox Cx to Mid Cx lesion was previously treated. The lesion is located at the bend. Mid Cx to Dist Cx lesion is 40% stenosed.  Right Coronary Artery Ost RCA to Prox RCA lesion is 70%  stenosed. Prox RCA lesion is 100% stenosed. The lesion was previously treated . Dist RCA lesion is 80% stenosed.  Right Posterior Descending Artery RPDA lesion is 70% stenosed.  Graft To RPDA  Intervention  No interventions have been documented.   CARDIAC CATHETERIZATION  CARDIAC CATHETERIZATION 06/24/2019  Conclusion Images from the original result were not included.   Prox RCA lesion is 99% stenosed.  Prox RCA to Mid RCA lesion is 50% stenosed.  Ost RCA to Prox RCA lesion is 70% stenosed.  Previously placed Prox Cx to Mid Cx stent (unknown type) is widely patent.  Ost Cx to Prox Cx lesion is 40% stenosed.  Mid Cx to Dist Cx lesion is 40% stenosed.  Ost LM to Mid LM lesion is 40% stenosed.  BRIYAH WHEELWRIGHT is a 83 y.o. female   [id number] LOCATION:  FACILITY: [facility] PHYSICIAN: Lauro Portal, M.D. [birth year]   DATE OF PROCEDURE:  06/24/2019  DATE OF DISCHARGE:     CARDIAC CATHETERIZATION / Attempted PCI RCA    History obtained from chart review.Aerin Delany is a 83 year old thin appearing Caucasian female status post mid AV groove circumflex tenting by Dr. Addie Holstein in May followed by orbital atherectomy, PCI and stenting of a diffusely calcified and diseased RCA by Dr. Abel Hoe the following day.  Her LAD is free of significant disease.  She has normal LV function.  She did well after that until recently when she is had accelerated angina which is nitro responsive.  She was seen virtually by Friddie Jetty, NP who arrange for her to undergo outpatient diagnostic coronary angiography today.   PROCEDURE DESCRIPTION:  The patient was brought to the second floor [facility] cath lab in the postabsorptive state.  She was premedicated with IV Versed  and fentanyl .  Her right wristwas prepped and shaved in usual sterile fashion. Xylocaine  1% was used for local anesthesia. A 6 French sheath was inserted into the right radial artery using standard  Seldinger technique. The patient received 3000 units  of heparin  intravenously.  A 5 [place] TIG catheter pigtail catheters were used for selective coronary angiography and obtain left heart pressures.  Omnipaque  dye was used for the entirety of the case.  Retrograde aorta, ventricular and pullback pressures were recorded.  Radial cocktail was administered via the SideArm sheath.  The patient received an additional 3000 units of heparin  with an ACT in the 300 range.  On the pigtail she is for the entirety of the intervention.  Retroaortic pressures monitored to the case.  Patient was  already on uninterrupted DAPT with aspirin  and clopidogrel .  Using a 6 [place] AL 0.75 cm guide catheter along with a 0.14 prowater guidewire I attempted to cross the 99% mid dominant RCA stenosis within the previously placed stent.  The Prowater would not go.  I used a 2 mm balloon as backup and again would not cross the vessel.  I changed to a [employer] and it appeared that the lesion directed the wire outside of the stent into the subintimal space.  I was unable to pass a balloon down to the lesion.  After reviewing the case with Dr. Loetta Ringer the consensus was to terminate the procedure and get a T CTS consult.  Impression Unsuccessful attempt at RCA PCI and reintervention for aggressive in-stent restenosis in the setting of accelerated angina.  I was unable to cross the disease segment and I suspect the wire was outside of the stented segment in the subintimal space.  Because of this I elected to abort the procedure and have T CTS evaluate for CABG.  The sheath was removed and a TR band was placed on the right wrist to achieve patent hemostasis.  The patient left lab in stable condition.  Clopidogrel  was discontinued.  I will rerestart heparin  4 hours after sheath removal.  Lauro Portal. MD, [facility] 06/24/2019 2:14 PM  Findings Coronary Findings Diagnostic  Dominance: Right  Left Main Ost LM to Mid LM lesion is 40%  stenosed.  Left Circumflex Ost Cx to Prox Cx lesion is 40% stenosed. Previously placed Prox Cx to Mid Cx stent (unknown type) is widely patent. The lesion is located at the bend. Mid Cx to Dist Cx lesion is 40% stenosed.  Right Coronary Artery Ost RCA to Prox RCA lesion is 70% stenosed. Prox RCA lesion is 99% stenosed. The lesion was previously treated. Prox RCA to Mid RCA lesion is 50% stenosed. The lesion was previously treated.  Intervention  No interventions have been documented.     ECHOCARDIOGRAM  ECHOCARDIOGRAM COMPLETE 10/09/2023  Narrative ECHOCARDIOGRAM REPORT    Patient Name:   OMMIE DEGEORGE [facility] Date of Exam: 10/09/2023 Medical Rec #:  [id number]                 Height:       61.0 in Accession #:    [id number]                Weight:       112.2 lb Date of Birth:  [birth year]                 BSA:          1.477 m Patient Age:    82 years                  BP:           175/72 mmHg Patient Gender: F                         HR:           68 bpm. Exam Location:  [address]  Procedure: 2D Echo, Cardiac Doppler, Color Doppler and Strain Analysis  Indications:    I49.3 PVC's  History:        Patient has prior history of Echocardiogram examinations, most recent 12/10/2022. CAD; Prior CABG.  Sonographer:    Juventino Oppenheim RCS Referring Phys: [id number] SUZANN RIDDLE  IMPRESSIONS   1. Left ventricular ejection fraction, by  estimation, is 55 to 60%. The left ventricle has normal function. The left ventricle has no regional wall motion abnormalities. Left ventricular diastolic parameters are indeterminate. The average left ventricular global longitudinal strain is -19.4 %. The global longitudinal strain is normal. 2. Right ventricular systolic function is normal. The right ventricular size is normal. There is mildly elevated pulmonary artery systolic pressure. 3. The mitral valve is abnormal. Severe mitral valve regurgitation. 4. Tricuspid regrugitation best displayed  in clip 62. Tricuspid valve regurgitation is severe. 5. The aortic valve is tricuspid. There is moderate calcification of the aortic valve. There is moderate thickening of the aortic valve. Aortic valve regurgitation is mild.  Comparison(s): Prior images reviewed side by side. Mitral and tricuspid regurgitation have worsened. Frequent PVCs noted on rhythm strip of this study.  FINDINGS Left Ventricle: Left ventricular ejection fraction, by estimation, is 55 to 60%. The left ventricle has normal function. The left ventricle has no regional wall motion abnormalities. The average left ventricular global longitudinal strain is -19.4 %. The global longitudinal strain is normal. The left ventricular internal cavity size was normal in size. There is no left ventricular hypertrophy. Left ventricular diastolic parameters are indeterminate.  Right Ventricle: The right ventricular size is normal. No increase in right ventricular wall thickness. Right ventricular systolic function is normal. There is mildly elevated pulmonary artery systolic pressure. The tricuspid regurgitant velocity is 3.09 m/s, and with an assumed right atrial pressure of 3 mmHg, the estimated right ventricular systolic pressure is 41.2 mmHg.  Left Atrium: Left atrial size was normal in size.  Right Atrium: Right atrial size was normal in size.  Pericardium: There is no evidence of pericardial effusion.  Mitral Valve: The mitral valve is abnormal. Mild mitral annular calcification. Severe mitral valve regurgitation.  Tricuspid Valve: Tricuspid regrugitation best displayed in clip 62. The tricuspid valve is normal in structure. Tricuspid valve regurgitation is severe. No evidence of tricuspid stenosis.  Aortic Valve: The aortic valve is tricuspid. There is moderate calcification of the aortic valve. There is moderate thickening of the aortic valve. Aortic valve regurgitation is mild. Aortic regurgitation PHT measures 486  msec.  Pulmonic Valve: The pulmonic valve was normal in structure. Pulmonic valve regurgitation is mild. No evidence of pulmonic stenosis.  Aorta: The aortic root, ascending aorta and aortic arch are all structurally normal, with no evidence of dilitation or obstruction and the aortic root and ascending aorta are structurally normal, with no evidence of dilitation.  IAS/Shunts: The atrial septum is grossly normal.   LEFT VENTRICLE PLAX 2D LVIDd:         4.00 cm   Diastology LVIDs:         2.40 cm   LV e' medial:    8.81 cm/s LV PW:         0.80 cm   LV E/e' medial:  15.8 LV IVS:        0.90 cm   LV e' lateral:   13.60 cm/s LVOT diam:     2.00 cm   LV E/e' lateral: 10.2 LV SV:         62 LV SV Index:   42        2D Longitudinal Strain LVOT Area:     3.14 cm  2D Strain GLS (A2C):   -13.1 % 2D Strain GLS (A3C):   -27.1 % 2D Strain GLS (A4C):   -18.1 % 2D Strain GLS Avg:     -19.4 %  RIGHT VENTRICLE RV  Basal diam:  3.30 cm RV S prime:     11.30 cm/s TAPSE (M-mode): 1.6 cm RVSP:           41.2 mmHg  LEFT ATRIUM             Index        RIGHT ATRIUM           Index LA diam:        3.70 cm 2.50 cm/m   RA Pressure: 3.00 mmHg LA Vol (A2C):   40.2 ml 27.21 ml/m  RA Area:     11.20 cm LA Vol (A4C):   38.5 ml 26.06 ml/m  RA Volume:   23.90 ml  16.18 ml/m LA Biplane Vol: 41.3 ml 27.96 ml/m AORTIC VALVE LVOT Vmax:   95.00 cm/s LVOT Vmean:  61.000 cm/s LVOT VTI:    0.197 m AI PHT:      486 msec  AORTA Ao Root diam: 3.00 cm Ao Asc diam:  3.50 cm  MITRAL VALVE                  TRICUSPID VALVE MV Area (PHT):                TR Peak grad:   38.2 mmHg MV Decel Time:                TR Vmax:        309.00 cm/s MR Peak grad:    108.2 mmHg   Estimated RAP:  3.00 mmHg MR Mean grad:    81.0 mmHg    RVSP:           41.2 mmHg MR Vmax:         520.00 cm/s MR Vmean:        433.0 cm/s   SHUNTS MR PISA:         2.65 cm     Systemic VTI:  0.20 m MR PISA Eff ROA: 16 mm       Systemic Diam:  2.00 cm MR PISA Radius:  0.65 cm MV E velocity: 139.00 cm/s MV A velocity: 81.20 cm/s MV E/A ratio:  1.71  Gloriann Larger MD Electronically signed by Gloriann Larger MD Signature Date/Time: 10/09/2023/3:52:14 PM    Final   TEE  ECHO TEE 01/22/2024  Narrative TRANSESOPHOGEAL ECHO REPORT    Patient Name:   SORCHA ROTUNNO [facility] Date of Exam: 01/22/2024 Medical Rec #:  [id number]                 Height:       62.0 in Accession #:    [id number]                Weight:       110.0 lb Date of Birth:  04-18-1941                 BSA:          1.483 m Patient Age:    82 years                  BP:           148/54 mmHg Patient Gender: F                         HR:           74 bpm. Exam Location:  Inpatient  Procedure: Transesophageal Echo, Cardiac Doppler, Color Doppler and 3D  Echo (Both Spectral and Color Flow Doppler were utilized during procedure).  Indications:    Severe Mitral Regurgitation  History:        Patient has prior history of Echocardiogram examinations, most recent 10/09/2023. CAD, Prior CABG, Signs/Symptoms:Chest Pain; Risk Factors:Dyslipidemia and Hypertension.  Sonographer:    Kip Peon RDCS Referring Phys: [phone] Jahanna Raether  PROCEDURE: After discussion of the risks and benefits of a TEE, an informed consent was obtained from the patient. The transesophogeal probe was passed without difficulty through the esophogus of the patient. Imaged were obtained with the patient in a left lateral decubitus position. Sedation performed by different physician. The patient was monitored while under deep sedation. Anesthestetic sedation was provided intravenously by Anesthesiology: 123mg  of Propofol . Supplementary images were obtained from transthoracic windows as indicated to answer the clinical question. The patient developed no complications during the procedure.  IMPRESSIONS   1. Left ventricular ejection fraction, by estimation, is 60 to 65%. The left  ventricle has normal function. 2. Right ventricular systolic function is normal. The right ventricular size is normal. There is mildly elevated pulmonary artery systolic pressure. The estimated right ventricular systolic pressure is 38.8 mmHg (based on an assumed RA pressure of 3 mmHg). 3. Left atrial size was mildly dilated. No left atrial/left atrial appendage thrombus was detected. 4. The mitral valve is degenerative. Mild to moderate mitral valve regurgitation. 5. Tricuspid valve regurgitation is mild to moderate. 6. The aortic valve is tricuspid. There is moderate calcification of the aortic valve. Aortic valve regurgitation is mild. Moderate aortic valve stenosis. Aortic valve area, by VTI measures 0.98 cm. Aortic valve mean gradient measures 12.0 mmHg. Aortic valve Vmax measures 2.31 m/s. Peak gradient 21.3 mmHg, DI is 0.35. AVA by planimetry was 1.03 cm2. 7. 3D performed of the mitral valve and demonstrates calcified posterior leaflet with restriction and mild to moderate regurgitation.  FINDINGS Left Ventricle: Left ventricular ejection fraction, by estimation, is 60 to 65%. The left ventricle has normal function. The left ventricular internal cavity size was normal in size. There is no left ventricular hypertrophy.  Right Ventricle: The right ventricular size is normal. No increase in right ventricular wall thickness. Right ventricular systolic function is normal. There is mildly elevated pulmonary artery systolic pressure. The tricuspid regurgitant velocity is 2.99 m/s, and with an assumed right atrial pressure of 3 mmHg, the estimated right ventricular systolic pressure is 38.8 mmHg.  Left Atrium: Left atrial size was mildly dilated. No left atrial/left atrial appendage thrombus was detected.  Right Atrium: Right atrial size was normal in size.  Pericardium: There is no evidence of pericardial effusion.  Mitral Valve: The mitral valve is degenerative in appearance. There is  moderate calcification of the posterior mitral valve leaflet(s). Mild to moderate mitral annular calcification. Mild to moderate mitral valve regurgitation, with centrally-directed jet.  Tricuspid Valve: The tricuspid valve is grossly normal. Tricuspid valve regurgitation is mild to moderate.  Aortic Valve: The aortic valve is tricuspid. There is moderate calcification of the aortic valve. Aortic valve regurgitation is mild. Moderate aortic stenosis is present. Aortic valve mean gradient measures 12.0 mmHg. Aortic valve peak gradient measures 21.3 mmHg. Aortic valve area, by VTI measures 0.98 cm.  Pulmonic Valve: The pulmonic valve was grossly normal. Pulmonic valve regurgitation is trivial.  Aorta: The aortic root and ascending aorta are structurally normal, with no evidence of dilitation.  IAS/Shunts: No atrial level shunt detected by color flow Doppler.  Additional Comments: 3D was performed not requiring image  post processing on an independent workstation and was normal. Spectral Doppler performed.  LEFT VENTRICLE PLAX 2D LVOT diam:     1.90 cm LV SV:         56 LV SV Index:   38 LVOT Area:     2.84 cm   AORTIC VALVE AV Area (Vmax):    1.12 cm AV Area (Vmean):   1.05 cm AV Area (VTI):     0.98 cm AV Vmax:           231.00 cm/s AV Vmean:          161.000 cm/s AV VTI:            0.569 m AV Peak Grad:      21.3 mmHg AV Mean Grad:      12.0 mmHg LVOT Vmax:         91.20 cm/s LVOT Vmean:        59.700 cm/s LVOT VTI:          0.197 m LVOT/AV VTI ratio: 0.35  AORTA Ao Asc diam: 3.10 cm  TRICUSPID VALVE TR Peak grad:   35.8 mmHg TR Vmax:        299.00 cm/s  SHUNTS Systemic VTI:  0.20 m Systemic Diam: 1.90 cm  Dinah Franco MD Electronically signed by Dinah Franco MD Signature Date/Time: 01/22/2024/2:07:25 PM    Final  MONITORS  LONG TERM MONITOR (3-14 DAYS) 07/09/2023  Narrative Patch Wear Time:  2 days and 20 hours (2024-10-26T21:28:01-399 to  2024-10-29T18:23:11-398)  Patient had a min HR of 50 bpm, max HR of 152 bpm, and avg HR of 68 bpm. Predominant underlying rhythm was Sinus Rhythm. 5 Supraventricular Tachycardia runs occurred, the run with the fastest interval lasting 4 beats with a max rate of 152 bpm, the longest lasting 7 beats with an avg rate of 102 bpm. Isolated SVEs were rare (<1.0%), SVE Couplets were rare (<1.0%), and SVE Triplets were rare (<1.0%). Isolated VEs were frequent (11.0%, 31045), VE Couplets were rare (<1.0%, 269), and VE Triplets were rare (<1.0%, 9). Ventricular Bigeminy and Trigeminy were present.  SR/SB/ST Short runs of SVT Occasional PACs/ Freq PVSs (11%burden) ROV to discuss       ______________________________________________________________________________________________      Risk Assessment/Calculations:            Physical Exam:   VS:  BP (!) 146/60   Pulse 60   Ht 5\' 2"  (1.575 m)   Wt 108 lb 3.2 oz (49.1 kg)   LMP  (LMP Unknown)   SpO2 99%   BMI 19.79 kg/m    Wt Readings from Last 3 Encounters:  01/28/24 108 lb 3.2 oz (49.1 kg)  01/22/24 110 lb (49.9 kg)  01/08/24 110 lb (49.9 kg)    GEN: Well nourished, well developed in no acute distress NECK: No JVD; No carotid bruits CARDIAC: RRR, no murmurs, rubs, gallops RESPIRATORY:  Clear to auscultation without rales, wheezing or rhonchi  ABDOMEN: Soft, non-tender, non-distended EXTREMITIES:  No edema; No deformity   ASSESSMENT AND PLAN: .    Dyspnea on exertion: Initially felt to be due to severe mitral regurgitation seen on previous echocardiogram in February 2025, however subsequent TEE and the left and right heart cath suggest mitral regurgitation is not severe, patient does have moderate aortic stenosis.  I discussed the case with our cardiac imager, we will proceed with a cardiac MRI to further quantify the degree of aortic stenosis and mitral regurgitation.  I ambulated the patient  2 laps around the office, surprisingly,  her heart rate never went up, in fact during the walk, her heart rate dipped, I will decrease metoprolol  to avoid chronotropic incompetence.  Due to shortness of breath with exertion, I did change Lasix  to as needed.  Mitral regurgitation: Severe MR noted on previous TTE in February 2025, however subsequent TEE and left and right heart cath did not show severe MR.  I discussed the case with our imager, will proceed with cardiac MRI  CAD: Recent left and right heart cath showed stable coronary anatomy  Hyperlipidemia: On rosuvastatin   Frequent PVCs: I previously referred the patient to EP service for evaluation of frequent PVCs seen on heart monitor, her PVC burden was 11%.  She did not want to try amiodarone due to concern for side effect.  She did not feel anything different on mexiletine and Ranexa .  I decided to reduce her blocker today mainly because her heart rate showed no increase with walking despite shortness of breath.  I am concerned of chronotropic incompetence contributing to her shortness of breath with exertion.        Dispo: Follow-up with Dr. Dean Every in 2 to 32-month.  Signed, Ervin Heath, PA

## 2024-01-30 ENCOUNTER — Other Ambulatory Visit: Payer: Self-pay | Admitting: Physician Assistant

## 2024-01-30 ENCOUNTER — Encounter: Payer: Self-pay | Admitting: Cardiovascular Disease

## 2024-01-30 DIAGNOSIS — I080 Rheumatic disorders of both mitral and aortic valves: Secondary | ICD-10-CM

## 2024-01-30 NOTE — Telephone Encounter (Signed)
 I have spoken to the patient, she is more than 1 week out from the cardiac cath.  Her pain is 3-4 out of 10 at this time.  I recommended continue observation, no heavy lifting.  If the raised area continue to balloon out and that her pain worsens suddenly, she will need to seek urgent medical attention.  Hopefully in the next few days, the raised area will subside.  On the second note, I have also discussed with the patient possibility of ordering a cardiac MRI.  Dr. Lorie Rook has previously recommended a cardiac MRI to assess the patient's valvular leakage and stenosis, I discussed his case with Dr. Rolm Clos who is one of our reader who felt she would benefit from cardiac MRI.  I will place the order.

## 2024-02-23 ENCOUNTER — Other Ambulatory Visit: Payer: Self-pay | Admitting: Podiatry

## 2024-02-25 ENCOUNTER — Telehealth: Payer: Self-pay | Admitting: Podiatry

## 2024-02-25 MED ORDER — CICLOPIROX 8 % EX SOLN: Freq: Every day | CUTANEOUS | 0 refills | Status: DC

## 2024-02-25 NOTE — Telephone Encounter (Signed)
 Patient is requesting refill for Ciclopirox  (Penlac ) 8% solution

## 2024-03-01 DIAGNOSIS — M810 Age-related osteoporosis without current pathological fracture: Secondary | ICD-10-CM | POA: Diagnosis not present

## 2024-03-01 DIAGNOSIS — I2510 Atherosclerotic heart disease of native coronary artery without angina pectoris: Secondary | ICD-10-CM | POA: Diagnosis not present

## 2024-03-01 DIAGNOSIS — C50412 Malignant neoplasm of upper-outer quadrant of left female breast: Secondary | ICD-10-CM | POA: Diagnosis not present

## 2024-03-01 DIAGNOSIS — I340 Nonrheumatic mitral (valve) insufficiency: Secondary | ICD-10-CM | POA: Diagnosis not present

## 2024-03-10 DIAGNOSIS — Z008 Encounter for other general examination: Secondary | ICD-10-CM | POA: Diagnosis not present

## 2024-03-10 DIAGNOSIS — Z87891 Personal history of nicotine dependence: Secondary | ICD-10-CM | POA: Diagnosis not present

## 2024-03-10 DIAGNOSIS — I700 Atherosclerosis of aorta: Secondary | ICD-10-CM | POA: Diagnosis not present

## 2024-03-10 DIAGNOSIS — C50919 Malignant neoplasm of unspecified site of unspecified female breast: Secondary | ICD-10-CM | POA: Diagnosis not present

## 2024-03-10 DIAGNOSIS — Z833 Family history of diabetes mellitus: Secondary | ICD-10-CM | POA: Diagnosis not present

## 2024-03-10 DIAGNOSIS — E785 Hyperlipidemia, unspecified: Secondary | ICD-10-CM | POA: Diagnosis not present

## 2024-03-10 DIAGNOSIS — Z7982 Long term (current) use of aspirin: Secondary | ICD-10-CM | POA: Diagnosis not present

## 2024-03-10 DIAGNOSIS — Z8249 Family history of ischemic heart disease and other diseases of the circulatory system: Secondary | ICD-10-CM | POA: Diagnosis not present

## 2024-03-10 DIAGNOSIS — M1990 Unspecified osteoarthritis, unspecified site: Secondary | ICD-10-CM | POA: Diagnosis not present

## 2024-03-10 DIAGNOSIS — M810 Age-related osteoporosis without current pathological fracture: Secondary | ICD-10-CM | POA: Diagnosis not present

## 2024-03-10 DIAGNOSIS — I739 Peripheral vascular disease, unspecified: Secondary | ICD-10-CM | POA: Diagnosis not present

## 2024-03-10 DIAGNOSIS — R32 Unspecified urinary incontinence: Secondary | ICD-10-CM | POA: Diagnosis not present

## 2024-03-10 DIAGNOSIS — I25119 Atherosclerotic heart disease of native coronary artery with unspecified angina pectoris: Secondary | ICD-10-CM | POA: Diagnosis not present

## 2024-03-29 ENCOUNTER — Encounter (HOSPITAL_COMMUNITY): Payer: Self-pay

## 2024-03-29 ENCOUNTER — Ambulatory Visit: Attending: Cardiovascular Disease | Admitting: Cardiovascular Disease

## 2024-03-29 ENCOUNTER — Encounter: Payer: Self-pay | Admitting: Cardiovascular Disease

## 2024-03-29 VITALS — BP 110/68 | HR 76 | Ht 61.0 in | Wt 107.8 lb

## 2024-03-29 DIAGNOSIS — E785 Hyperlipidemia, unspecified: Secondary | ICD-10-CM | POA: Diagnosis not present

## 2024-03-29 DIAGNOSIS — Z9861 Coronary angioplasty status: Secondary | ICD-10-CM | POA: Diagnosis not present

## 2024-03-29 DIAGNOSIS — I350 Nonrheumatic aortic (valve) stenosis: Secondary | ICD-10-CM | POA: Diagnosis not present

## 2024-03-29 DIAGNOSIS — R002 Palpitations: Secondary | ICD-10-CM

## 2024-03-29 DIAGNOSIS — R0989 Other specified symptoms and signs involving the circulatory and respiratory systems: Secondary | ICD-10-CM | POA: Diagnosis not present

## 2024-03-29 DIAGNOSIS — I2510 Atherosclerotic heart disease of native coronary artery without angina pectoris: Secondary | ICD-10-CM | POA: Diagnosis not present

## 2024-03-29 NOTE — Assessment & Plan Note (Signed)
 History of CAD status post AV groove stenting by Dr. Anner 01/04/19 with staged PCI, drug-eluting stenting and orbital atherectomy by Dr. Verlin of distal RCA 01/06/2019.  She underwent cardiac catheterization by myself 06/24/2019 revealing high-grade in-stent restenosis within the previously placed RCA stents which I was unable to cross.  Based on this she underwent CABG x 1 by Dr. Lucas with a vein to the distal RCA.  She was discharged home 07/02/2019.  She has been doing well since.  She recently had a right left heart cath by Dr.Thukkani 01/22/2024 revealing a patent RCA vein graft and patent mid circumflex stent.  The aortic stenosis did not appear to be as physiologically significant as the echo had suggested.  A cardiac MRI was suggested which is being performed this coming Thursday.

## 2024-03-29 NOTE — Assessment & Plan Note (Signed)
 History of hyperlipidemia on high-dose statin therapy with lipid profile performed 04/10/2023 revealing total cholesterol 151, LDL 64 and HDL 74.

## 2024-03-29 NOTE — Patient Instructions (Signed)
 Medication Instructions:  Your physician recommends that you continue on your current medications as directed. Please refer to the Current Medication list given to you today.  *If you need a refill on your cardiac medications before your next appointment, please call your pharmacy*   Testing/Procedures: Your physician has requested that you have a carotid duplex. This test is an ultrasound of the carotid arteries in your neck. It looks at blood flow through these arteries that supply the brain with blood. Allow one hour for this exam. There are no restrictions or special instructions. This will take place at [address], 4th floor  Please note: We ask at that you not bring children with you during ultrasound (echo/ vascular) testing. Due to room size and safety concerns, children are not allowed in the ultrasound rooms during exams. Our front office staff cannot provide observation of children in our lobby area while testing is being conducted. An adult accompanying a patient to their appointment will only be allowed in the ultrasound room at the discretion of the ultrasound technician under special circumstances. We apologize for any inconvenience.    Follow-Up: At [facility], you and your health needs are our priority.  As part of our continuing mission to provide you with exceptional heart care, our providers are all part of one team.  This team includes your primary Cardiologist (physician) and Advanced Practice Providers or APPs (Physician Assistants and Nurse Practitioners) who all work together to provide you with the care you need, when you need it.  Your next appointment:   6 month(s)  Provider:   Hao Meng, PA-C         Then, Dorn Lesches, MD will plan to see you again in 12 month(s).

## 2024-03-29 NOTE — Assessment & Plan Note (Signed)
 Transesophageal echo performed by Dr. Mona 01/22/2024 revealed mild aortic regurgitation, moderate aortic stenosis with a valve area of 0.98 cm.  Apparently this was not confirmed by right left heart cath performed by Dr.Thukkani.  A cardiac MRI scheduled for this coming Thursday.

## 2024-03-29 NOTE — Assessment & Plan Note (Signed)
 Bilateral carotid bruits with carotid Dopplers performed 5 years ago.  And going to repeat carotid Doppler studies.

## 2024-03-29 NOTE — Assessment & Plan Note (Signed)
 History of palpitations from high PVC burden.  I did refer her to Dr. Cindie who placed her on mexiletine with no clinical benefit.  She feels these but they really are not affecting her quality of life.

## 2024-03-29 NOTE — Progress Notes (Signed)
 03/29/2024 Rachel Valentine   10/18/1940  [id number]  Primary Physician Chrystal Lamarr RAMAN, MD Primary Cardiologist: Dorn JINNY Lesches MD [place], [place], [place]  HPI:  Rachel Valentine is a 83 y.o.     widowed Caucasian female mother of 2, grandmother and one grandchild referred to me initially by Dr. Lennard, her prior PCP.  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    I last saw her in the office 10/14/2023.  She was having palpitations and chest pain.  Her risk factors include hyperlipidemia and family history with a mother who had CABG.  She is never had a heart attack or stroke.  She was admitted to [facility] on 01/01/2023 unstable angina.  She ruled out for myocardial infarction.  She underwent cardiac catheterization by Dr. Anner 01/04/2019 revealing normal LV function, high-grade mid AV groove circumflex on a band and high-grade diffuse calcified RCA stenosis with a fairly normal LAD.  She underwent LAD intervention by Dr. Anner with staged diamondback orbital rotational atherectomy, PCI and drug-eluting stenting of the proximal mid and distal RCA by Dr. Verlin on 01/06/2019.  She was discharged home the following day.  She is felt well and denies chest pain.  She is on aspirin  and Plavix  as well as high-dose atorvastatin  and a beta-blocker.  She is a fairly active exerciser and currently is back walking 2.5 to 4 miles a day 6 to 7 days a week without limitation.   She underwent outpatient radial diagnostic coronary angiography by myself 06/24/2019 revealing high-grade in-stent restenosis within in the previously placed RCA stents which I was unable to cross.  Based on this she underwent CABG x1 with a vein graft to the distal RCA by Dr. Lucas during the same hospitalization and was discharged home on 07/02/2019.  She is done well since.  She gets occasional early evening palpitations.   She  has been diagnosed with breast cancer and has finished her chemotherapy and radiation therapy.  She currently is on Herceptin  and has 5 sessions left.   She had developed gross hematuria for unclear reasons being worked up by Dr. Watt at [facility] urology.  She is had symptomatic anemia requiring transfusion of 2 units of packed red blood cells.  Hematuria has resolved.   She had noticed increasing symptomatic palpitations which began after her Prolia  shot at the end of February.  Apparently this is one of the known side effects of this medication.  She did have a 2D echo performed 11/07/2021 that showed normal LV function with, with grade 2 diastolic dysfunction, and RV systolic pressure of 40 with moderate MR and moderate AI.  An event monitor showed frequent PVCs often in a prior trigeminal pattern with occasional couplets and triplets as well as episodes of SVT associated with the symptoms.  I did double her beta-blocker which resulted in some improvement in her symptoms.   She had an event monitor that showed frequent PVCs with 11% burden.  She saw Dr. Cindie who placed her on mexiletine with no clinical benefit.  She also had a 2D echocardiogram performed 10/05/2023 that showed normal LV size and function with severe MR that had progressed since her previous echo 2 years before.  Since I saw her 6 months ago she did have a right left heart cath by Dr.Thukkani 01/22/2024 revealing a patent RCA vein graft, patent mid AV groove circumflex stent.  The  right heart cath did not suggest that her aortic stenosis was as significant as the TEE suggested it was performed by Dr. Mona 01/22/2024 with a valve area of 0.98 cm with a peak gradient of 21 mmHg.  She also had mild to moderate MR and TR as well.  Over the last several weeks she says that she felt clinically improved.  The palpitations are less noticeable.  She denies chest pain or shortness of breath.   Current Meds  Medication Sig   acetaminophen   (TYLENOL ) 500 MG tablet Take 500 mg by mouth every 6 (six) hours as needed for moderate pain (pain score 4-6).   anastrozole  (ARIMIDEX ) 1 MG tablet TAKE 1 TABLET BY MOUTH EVERY DAY   aspirin  EC 81 MG tablet Take 81 mg by mouth daily. Swallow whole.   Biotin 89999 MCG TABS Take 10,000 mg by mouth daily.    ciclopirox  (PENLAC ) 8 % solution Apply topically at bedtime. Apply over nail and surrounding skin. Apply daily over previous coat. After seven (7) days, may remove with alcohol and continue cycle. (Patient taking differently: Apply 1 Application topically at bedtime as needed (fungus). Apply over nail and surrounding skin. Apply daily over previous coat. After seven (7) days, may remove with alcohol and continue cycle.)   ciclopirox  (PENLAC ) 8 % solution Apply topically at bedtime. Apply over nail and surrounding skin. Apply daily over previous coat. After seven (7) days, may remove with alcohol and continue cycle.   furosemide  (LASIX ) 20 MG tablet Take 1 tablet (20 mg total) by mouth as needed.   ipratropium (ATROVENT) 0.03 % nasal spray Place 2 sprays into both nostrils daily. May use a second time as needed for allergies   metoprolol  succinate (TOPROL -XL) 25 MG 24 hr tablet Take 12.5 mg by mouth daily.   Multiple Vitamins-Minerals (CITRACAL +D3 PO) Take 1 tablet by mouth daily.   nitroGLYCERIN  (NITROSTAT ) 0.4 MG SL tablet Place 1 tablet (0.4 mg total) under the tongue every 5 (five) minutes as needed for chest pain.   pantoprazole  (PROTONIX ) 40 MG tablet TAKE 1 TABLET BY MOUTH EVERY DAY   rosuvastatin  (CRESTOR ) 40 MG tablet Take 40 mg by mouth daily.   valACYclovir (VALTREX) 1000 MG tablet Take 2,000 mg by mouth 2 (two) times daily as needed (fever blisters).     Allergies  Allergen Reactions   Paclitaxel  Rash   Fosamax [Alendronate] Nausea Only    Upset stomach   Prednisone Other (See Comments)    Blood pressure high when she takes it   Clindamycin /Lincomycin Rash   Latex Itching and  Rash   Penicillins Rash    Social History   Socioeconomic History   Marital status: Widowed    Spouse name: Not on file   Number of children: Not on file   Years of education: Not on file   Highest education level: Not on file  Occupational History   Not on file  Tobacco Use   Smoking status: Former    Current packs/day: 0.00    Types: Cigarettes    Quit date: 01/06/1999    Years since quitting: 25.2   Smokeless tobacco: Never  Vaping Use   Vaping status: Never Used  Substance and Sexual Activity   Alcohol use: No   Drug use: Never   Sexual activity: Not on file  Other Topics Concern   Not on file  Social History Narrative   Not on file   Social Drivers of Health   Financial Resource Strain:  Not on file  Food Insecurity: Not on file  Transportation Needs: Not on file  Physical Activity: Not on file  Stress: Not on file  Social Connections: Not on file  Intimate Partner Violence: Not on file     Review of Systems: General: negative for chills, fever, night sweats or weight changes.  Cardiovascular: negative for chest pain, dyspnea on exertion, edema, orthopnea, palpitations, paroxysmal nocturnal dyspnea or shortness of breath Dermatological: negative for rash Respiratory: negative for cough or wheezing Urologic: negative for hematuria Abdominal: negative for nausea, vomiting, diarrhea, bright red blood per rectum, melena, or hematemesis Neurologic: negative for visual changes, syncope, or dizziness All other systems reviewed and are otherwise negative except as noted above.    Blood pressure 110/68, pulse 76, height 5' 1 (1.549 m), weight 107 lb 12.8 oz (48.9 kg), SpO2 97%.  General appearance: alert and no distress Neck: no adenopathy, no JVD, supple, symmetrical, trachea midline, thyroid  not enlarged, symmetric, no tenderness/mass/nodules, and soft bilateral carotid bruits. Lungs: clear to auscultation bilaterally Heart: 2/6 outflow tract murmur consistent  with aortic stenosis Extremities: extremities normal, atraumatic, no cyanosis or edema Pulses: 2+ and symmetric Skin: Skin color, texture, turgor normal. No rashes or lesions Neurologic: Grossly normal  EKG not performed today      ASSESSMENT AND PLAN:   CAD S/P percutaneous coronary angioplasty History of CAD status post AV groove stenting by Dr. Anner 01/04/19 with staged PCI, drug-eluting stenting and orbital atherectomy by Dr. Verlin of distal RCA 01/06/2019.  She underwent cardiac catheterization by myself 06/24/2019 revealing high-grade in-stent restenosis within the previously placed RCA stents which I was unable to cross.  Based on this she underwent CABG x 1 by Dr. Lucas with a vein to the distal RCA.  She was discharged home 07/02/2019.  She has been doing well since.  She recently had a right left heart cath by Dr.Thukkani 01/22/2024 revealing a patent RCA vein graft and patent mid circumflex stent.  The aortic stenosis did not appear to be as physiologically significant as the echo had suggested.  A cardiac MRI was suggested which is being performed this coming Thursday.  Dyslipidemia, goal LDL below 70 History of hyperlipidemia on high-dose statin therapy with lipid profile performed 04/10/2023 revealing total cholesterol 151, LDL 64 and HDL 74.  Palpitations History of palpitations from high PVC burden.  I did refer her to Dr. Cindie who placed her on mexiletine with no clinical benefit.  She feels these but they really are not affecting her quality of life.  Bilateral carotid bruits Bilateral carotid bruits with carotid Dopplers performed 5 years ago.  And going to repeat carotid Doppler studies.  Aortic stenosis Transesophageal echo performed by Dr. Mona 01/22/2024 revealed mild aortic regurgitation, moderate aortic stenosis with a valve area of 0.98 cm.  Apparently this was not confirmed by right left heart cath performed by Dr.Thukkani.  A cardiac MRI scheduled for this  coming Thursday.     Dorn DOROTHA Lesches MD FACP,FACC,FAHA, [facility] 03/29/2024 9:50 AM

## 2024-04-01 ENCOUNTER — Ambulatory Visit: Payer: Self-pay | Admitting: Physician Assistant

## 2024-04-01 ENCOUNTER — Ambulatory Visit (HOSPITAL_COMMUNITY)
Admission: RE | Admit: 2024-04-01 | Discharge: 2024-04-01 | Disposition: A | Discharge status: Home / Self Care | Source: Ambulatory Visit | Attending: Physician Assistant | Admitting: Physician Assistant

## 2024-04-01 ENCOUNTER — Other Ambulatory Visit: Payer: Self-pay | Admitting: Physician Assistant

## 2024-04-01 DIAGNOSIS — I080 Rheumatic disorders of both mitral and aortic valves: Secondary | ICD-10-CM | POA: Diagnosis not present

## 2024-04-01 MED ORDER — GADOBUTROL 1 MMOL/ML IV SOLN
8.0000 mL | Freq: Once | INTRAVENOUS | Status: AC | PRN
  Administered 2024-04-01: 8 mL via INTRAVENOUS

## 2024-04-05 ENCOUNTER — Ambulatory Visit: Payer: Self-pay | Admitting: Cardiovascular Disease

## 2024-04-05 ENCOUNTER — Ambulatory Visit (HOSPITAL_COMMUNITY)
Admission: RE | Admit: 2024-04-05 | Discharge: 2024-04-05 | Disposition: A | Discharge status: Home / Self Care | Source: Ambulatory Visit | Attending: Cardiovascular Disease | Admitting: Cardiovascular Disease

## 2024-04-05 DIAGNOSIS — E785 Hyperlipidemia, unspecified: Secondary | ICD-10-CM | POA: Diagnosis not present

## 2024-04-05 DIAGNOSIS — Z9861 Coronary angioplasty status: Secondary | ICD-10-CM | POA: Insufficient documentation

## 2024-04-05 DIAGNOSIS — I2510 Atherosclerotic heart disease of native coronary artery without angina pectoris: Secondary | ICD-10-CM | POA: Insufficient documentation

## 2024-04-05 DIAGNOSIS — R0989 Other specified symptoms and signs involving the circulatory and respiratory systems: Secondary | ICD-10-CM | POA: Insufficient documentation

## 2024-04-12 ENCOUNTER — Other Ambulatory Visit: Payer: Self-pay | Admitting: Family Medicine

## 2024-04-12 DIAGNOSIS — Z1231 Encounter for screening mammogram for malignant neoplasm of breast: Secondary | ICD-10-CM

## 2024-04-29 ENCOUNTER — Other Ambulatory Visit: Payer: Self-pay | Admitting: Cardiovascular Disease

## 2024-04-29 ENCOUNTER — Encounter: Payer: Self-pay | Admitting: Cardiovascular Disease

## 2024-04-29 DIAGNOSIS — K2100 Gastro-esophageal reflux disease with esophagitis, without bleeding: Secondary | ICD-10-CM

## 2024-04-29 MED ORDER — PANTOPRAZOLE SODIUM 40 MG PO TBEC: 40.0000 mg | DELAYED_RELEASE_TABLET | Freq: Every day | ORAL | 1 refills | Status: AC

## 2024-05-09 DIAGNOSIS — R509 Fever, unspecified: Secondary | ICD-10-CM | POA: Diagnosis not present

## 2024-05-09 DIAGNOSIS — U071 COVID-19: Secondary | ICD-10-CM | POA: Diagnosis not present

## 2024-05-27 ENCOUNTER — Ambulatory Visit
Admission: RE | Admit: 2024-05-27 | Discharge: 2024-05-27 | Disposition: A | Discharge status: Home / Self Care | Source: Ambulatory Visit | Attending: Family Medicine | Admitting: Family Medicine

## 2024-05-27 DIAGNOSIS — Z1231 Encounter for screening mammogram for malignant neoplasm of breast: Secondary | ICD-10-CM

## 2024-06-18 DIAGNOSIS — H6593 Unspecified nonsuppurative otitis media, bilateral: Secondary | ICD-10-CM | POA: Diagnosis not present

## 2024-06-18 DIAGNOSIS — R42 Dizziness and giddiness: Secondary | ICD-10-CM | POA: Diagnosis not present

## 2024-09-02 ENCOUNTER — Other Ambulatory Visit: Payer: Self-pay | Admitting: Hematology and Oncology

## 2024-09-09 ENCOUNTER — Encounter: Payer: Self-pay | Admitting: Physician Assistant

## 2024-09-27 ENCOUNTER — Inpatient Hospital Stay: Payer: Medicare HMO | Admitting: Hematology and Oncology

## 2024-09-28 ENCOUNTER — Encounter: Payer: Self-pay | Admitting: Cardiovascular Disease

## 2024-09-28 ENCOUNTER — Ambulatory Visit: Attending: Cardiovascular Disease | Admitting: Cardiovascular Disease

## 2024-09-28 VITALS — BP 144/62 | HR 68 | Ht 61.0 in | Wt 112.0 lb

## 2024-09-28 DIAGNOSIS — Z9861 Coronary angioplasty status: Secondary | ICD-10-CM | POA: Diagnosis not present

## 2024-09-28 DIAGNOSIS — R0989 Other specified symptoms and signs involving the circulatory and respiratory systems: Secondary | ICD-10-CM

## 2024-09-28 DIAGNOSIS — E785 Hyperlipidemia, unspecified: Secondary | ICD-10-CM | POA: Diagnosis not present

## 2024-09-28 DIAGNOSIS — I2510 Atherosclerotic heart disease of native coronary artery without angina pectoris: Secondary | ICD-10-CM

## 2024-09-28 DIAGNOSIS — I340 Nonrheumatic mitral (valve) insufficiency: Secondary | ICD-10-CM

## 2024-09-28 DIAGNOSIS — I350 Nonrheumatic aortic (valve) stenosis: Secondary | ICD-10-CM | POA: Diagnosis not present

## 2024-09-28 MED ORDER — ISOSORBIDE MONONITRATE ER 30 MG PO TB24: 15.0000 mg | ORAL_TABLET | Freq: Every day | ORAL | 3 refills | Status: AC

## 2024-09-28 NOTE — Progress Notes (Signed)
 "     09/28/2024 Rachel Valentine   [birth year]  [id number]  Primary Physician Chrystal Lamarr RAMAN, MD Primary Cardiologist: Dorn JINNY Lesches MD GENI [place], [place], [place]  HPI:  Rachel Valentine is a 84 y.o.  widowed Caucasian female mother of 2, grandmother and one grandchild referred to me initially by Dr. Lennard, her prior PCP.  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    I last saw her in the office 03/29/2024.  She was having palpitations and chest pain.  Her risk factors include hyperlipidemia and family history with a mother who had CABG.  She is never had a heart attack or stroke.  She was admitted to [facility] on 01/01/2023 unstable angina.  She ruled out for myocardial infarction.  She underwent cardiac catheterization by Dr. Anner 01/04/2019 revealing normal LV function, high-grade mid AV groove circumflex on a band and high-grade diffuse calcified RCA stenosis with a fairly normal LAD.  She underwent LAD intervention by Dr. Anner with staged diamondback orbital rotational atherectomy, PCI and drug-eluting stenting of the proximal mid and distal RCA by Dr. Verlin on 01/06/2019.  She was discharged home the following day.  She is felt well and denies chest pain.  She is on aspirin  and Plavix  as well as high-dose atorvastatin  and a beta-blocker.  She is a fairly active exerciser and currently is back walking 2.5 to 4 miles a day 6 to 7 days a week without limitation.   She underwent outpatient radial diagnostic coronary angiography by myself 06/24/2019 revealing high-grade in-stent restenosis within in the previously placed RCA stents which I was unable to cross.  Based on this she underwent CABG x1 with a vein graft to the distal RCA by Dr. Lucas during the same hospitalization and was discharged home on 07/02/2019.  She is done well since.  She gets occasional early evening palpitations.   She has  been diagnosed with breast cancer and has finished her chemotherapy and radiation therapy.  She currently is on Herceptin  and has 5 sessions left.   She had developed gross hematuria for unclear reasons being worked up by Dr. Watt at [facility] urology.  She is had symptomatic anemia requiring transfusion of 2 units of packed red blood cells.  Hematuria has resolved.   She had noticed increasing symptomatic palpitations which began after her Prolia  shot at the end of February.  Apparently this is one of the known side effects of this medication.  She did have a 2D echo performed 11/07/2021 that showed normal LV function with, with grade 2 diastolic dysfunction, and RV systolic pressure of 40 with moderate MR and moderate AI.  An event monitor showed frequent PVCs often in a prior trigeminal pattern with occasional couplets and triplets as well as episodes of SVT associated with the symptoms.  I did double her beta-blocker which resulted in some improvement in her symptoms.   She had an event monitor that showed frequent PVCs with 11% burden.  She saw Dr. Cindie who placed her on mexiletine with no clinical benefit.  She also had a 2D echocardiogram performed 10/05/2023 that showed normal LV size and function with severe MR that had progressed since her previous echo 2 years before.   She had a right left heart cath by Dr.Thukkani 01/22/2024 revealing a patent RCA vein graft, patent mid AV groove circumflex stent.  The right heart cath did not suggest that her  aortic stenosis was as significant as the TEE suggested it was performed by Dr. Mona 01/22/2024 with a valve area of 0.98 cm with a peak gradient of 21 mmHg.  She also had mild to moderate MR and TR as well.  A cardiac MRI performed 04/01/2024 confirmed that her MR was mild as well.   Since I saw her 6 months ago her palpitations have resolved as has her dyspnea on exertion.  She has not had new onset nitrate responsive chest pain over the last 3 to 4 months  that occurs randomly not necessarily supported by her coronary anatomy.   Active Medications[1]   Allergies[2]  Social History   Socioeconomic History   Marital status: Widowed    Spouse name: Not on file   Number of children: Not on file   Years of education: Not on file   Highest education level: Not on file  Occupational History   Not on file  Tobacco Use   Smoking status: Former    Current packs/day: 0.00    Types: Cigarettes    Quit date: 01/06/1999    Years since quitting: 25.7   Smokeless tobacco: Never  Vaping Use   Vaping status: Never Used  Substance and Sexual Activity   Alcohol use: No   Drug use: Never   Sexual activity: Not on file  Other Topics Concern   Not on file  Social History Narrative   Not on file   Social Drivers of Health   Tobacco Use: Medium Risk (03/29/2024)   Patient History    Smoking Tobacco Use: Former    Smokeless Tobacco Use: Never    Passive Exposure: Not on Actuary Strain: Not on file  Food Insecurity: Not on file  Transportation Needs: Not on file  Physical Activity: Not on file  Stress: Not on file  Social Connections: Not on file  Intimate Partner Violence: Not on file  Depression (EYV7-0): Not on file  Alcohol Screen: Not on file  Housing: Not on file  Utilities: Not on file  Health Literacy: Not on file     Review of Systems: General: negative for chills, fever, night sweats or weight changes.  Cardiovascular: negative for chest pain, dyspnea on exertion, edema, orthopnea, palpitations, paroxysmal nocturnal dyspnea or shortness of breath Dermatological: negative for rash Respiratory: negative for cough or wheezing Urologic: negative for hematuria Abdominal: negative for nausea, vomiting, diarrhea, bright red blood per rectum, melena, or hematemesis Neurologic: negative for visual changes, syncope, or dizziness All other systems reviewed and are otherwise negative except as noted above.    Blood  pressure (!) 144/62, pulse 68, height 5' 1 (1.549 m), weight 112 lb (50.8 kg), SpO2 99%.  General appearance: alert and no distress Neck: no adenopathy, no JVD, supple, symmetrical, trachea midline, thyroid  not enlarged, symmetric, no tenderness/mass/nodules, and bilateral carotid bruits versus transmitted murmur Lungs: clear to auscultation bilaterally Heart: 2/6 high-pitched outflow tract murmur consistent with aortic stenosis Extremities: extremities normal, atraumatic, no cyanosis or edema Pulses: 2+ and symmetric Skin: Skin color, texture, turgor normal. No rashes or lesions Neurologic: Grossly normal  EKG EKG Interpretation Date/Time:  Tuesday September 28 2024 13:19:07 EST Ventricular Rate:  68 PR Interval:  162 QRS Duration:  78 QT Interval:  402 QTC Calculation: 427 R Axis:   27  Text Interpretation: Normal sinus rhythm Normal ECG When compared with ECG of 08-Jan-2024 15:04, Premature ventricular complexes are no longer Present Confirmed by Court Carrier [phone]) on 09/28/2024 1:28:18  PM    ASSESSMENT AND PLAN:   CAD S/P percutaneous coronary angioplasty History of CAD status post high-grade mid AV groove circumflex stenosis stenting by Dr. Anner with staged intervention of the proximal and mid RCA by Dr. Verlin 01/06/2019.  I cathed her 06/24/2019 revealing high-grade in-stent restenosis within the in the previously placed RCA stent which I was unable to to cross.  The AV groove circumflex stent was patent.  She ultimately underwent CABG x 1 with a vein graft to her PDA by Dr. Lucas successfully.  Her most recent right and left heart cath was performed by Dr.Thukkani 01/22/2024 revealing a patent circumflex stent and a patent RCA vein graft.  She does complain of new onset nitro responsive angina over the last several months not necessarily related to any particular activity.  There is no substrate in her catheterization anatomy that would support this although I think it is  reasonable to start her on a low-dose long-acting oral nitrate.  Dyslipidemia, goal LDL below 70 History of dyslipidemia on high-dose rosuvastatin  with lipid profile performed 04/20/2024 revealing total cholesterol 141, LDL 55 and HDL of 72.  Mitral regurgitation History of severe mitral digitation by transthoracic echo 10/05/2023 that was later shown to be mild to moderate by transesophageal echo performed Dr. Mona 01/22/2024 confirmed by cardiac MRI.  She does have moderate aortic stenosis however with aortic valve area of 0.98 cm.  Which we are following by transthoracic echo.  She currently denies shortness of breath.  Bilateral carotid bruits Shown to have normal carotid Doppler studies suggesting that these are related to transmitted murmur for aortic stenosis.  Aortic stenosis Moderate aortic stenosis by TEE performed by Dr. Mona 01/22/2024 with a valve area of 0.98 cm.  Dr.Thukka did perform a trans aortic gradient using a Langston catheter measuring 12.7 mmHg.     Dorn DOROTHA Lesches MD FACP,FACC,FAHA, FSCAI 09/28/2024 1:48 PM    [1]  Current Meds  Medication Sig   acetaminophen  (TYLENOL ) 500 MG tablet Take 500 mg by mouth every 6 (six) hours as needed for moderate pain (pain score 4-6).   anastrozole  (ARIMIDEX ) 1 MG tablet TAKE 1 TABLET BY MOUTH EVERY DAY   aspirin  EC 81 MG tablet Take 81 mg by mouth daily. Swallow whole.   Biotin 89999 MCG TABS Take 10,000 mg by mouth daily.    ciclopirox  (PENLAC ) 8 % solution Apply topically at bedtime. Apply over nail and surrounding skin. Apply daily over previous coat. After seven (7) days, may remove with alcohol and continue cycle. (Patient taking differently: Apply 1 Application topically at bedtime as needed (fungus). Apply over nail and surrounding skin. Apply daily over previous coat. After seven (7) days, may remove with alcohol and continue cycle.)   ciclopirox  (PENLAC ) 8 % solution Apply topically at bedtime. Apply over nail and  surrounding skin. Apply daily over previous coat. After seven (7) days, may remove with alcohol and continue cycle.   furosemide  (LASIX ) 20 MG tablet Take 1 tablet (20 mg total) by mouth as needed.   ipratropium (ATROVENT) 0.03 % nasal spray Place 2 sprays into both nostrils daily. May use a second time as needed for allergies   isosorbide  mononitrate (IMDUR ) 30 MG 24 hr tablet Take 0.5 tablets (15 mg total) by mouth daily.   metoprolol  succinate (TOPROL -XL) 25 MG 24 hr tablet Take 12.5 mg by mouth daily.   Multiple Vitamins-Minerals (CITRACAL +D3 PO) Take 1 tablet by mouth daily.   nitroGLYCERIN  (NITROSTAT ) 0.4 MG SL  tablet Place 1 tablet (0.4 mg total) under the tongue every 5 (five) minutes as needed for chest pain.   pantoprazole  (PROTONIX ) 40 MG tablet Take 1 tablet (40 mg total) by mouth daily.   rosuvastatin  (CRESTOR ) 40 MG tablet Take 40 mg by mouth daily.   valACYclovir (VALTREX) 1000 MG tablet Take 2,000 mg by mouth 2 (two) times daily as needed (fever blisters).  [2]  Allergies Allergen Reactions   Paclitaxel  Rash   Fosamax [Alendronate] Nausea Only    Upset stomach   Prednisone Other (See Comments)    Blood pressure high when she takes it   Clindamycin /Lincomycin Rash   Latex Itching and Rash   Penicillins Rash   "

## 2024-09-28 NOTE — Assessment & Plan Note (Signed)
 History of severe mitral digitation by transthoracic echo 10/05/2023 that was later shown to be mild to moderate by transesophageal echo performed Dr. Mona 01/22/2024 confirmed by cardiac MRI.  She does have moderate aortic stenosis however with aortic valve area of 0.98 cm.  Which we are following by transthoracic echo.  She currently denies shortness of breath.

## 2024-09-28 NOTE — Assessment & Plan Note (Signed)
 History of dyslipidemia on high-dose rosuvastatin  with lipid profile performed 04/20/2024 revealing total cholesterol 141, LDL 55 and HDL of 72.

## 2024-09-28 NOTE — Assessment & Plan Note (Signed)
 Moderate aortic stenosis by TEE performed by Dr. Mona 01/22/2024 with a valve area of 0.98 cm.  Dr.Thukka did perform a trans aortic gradient using a Langston catheter measuring 12.7 mmHg.

## 2024-09-28 NOTE — Assessment & Plan Note (Signed)
 History of CAD status post high-grade mid AV groove circumflex stenosis stenting by Dr. Anner with staged intervention of the proximal and mid RCA by Dr. Verlin 01/06/2019.  I cathed her 06/24/2019 revealing high-grade in-stent restenosis within the in the previously placed RCA stent which I was unable to to cross.  The AV groove circumflex stent was patent.  She ultimately underwent CABG x 1 with a vein graft to her PDA by Dr. Lucas successfully.  Her most recent right and left heart cath was performed by Dr.Thukkani 01/22/2024 revealing a patent circumflex stent and a patent RCA vein graft.  She does complain of new onset nitro responsive angina over the last several months not necessarily related to any particular activity.  There is no substrate in her catheterization anatomy that would support this although I think it is reasonable to start her on a low-dose long-acting oral nitrate.

## 2024-09-28 NOTE — Patient Instructions (Addendum)
 Medication Instructions:   -Start taking isosorbide  mononitrate (Imdur ) 15mg  (1/2 tablet) once daily.  *If you need a refill on your cardiac medications before your next appointment, please call your pharmacy*   Follow-Up: At [facility], you and your health needs are our priority.  As part of our continuing mission to provide you with exceptional heart care, our providers are all part of one team.  This team includes your primary Cardiologist (physician) and Advanced Practice Providers or APPs (Physician Assistants and Nurse Practitioners) who all work together to provide you with the care you need, when you need it.  Your next appointment:   3 month(s)  Provider:   Hao Meng, PA-C         Then, Dorn Lesches, MD will plan to see you again in 6 month(s).    We recommend signing up for the patient portal called MyChart.  Sign up information is provided on this After Visit Summary.  MyChart is used to connect with patients for Virtual Visits (Telemedicine).  Patients are able to view lab/test results, encounter notes, upcoming appointments, etc.  Non-urgent messages can be sent to your provider as well.   To learn more about what you can do with MyChart, go to [email or web address].   Other Instructions

## 2024-09-28 NOTE — Assessment & Plan Note (Signed)
 Shown to have normal carotid Doppler studies suggesting that these are related to transmitted murmur for aortic stenosis.

## 2024-10-06 ENCOUNTER — Ambulatory Visit: Payer: Self-pay | Admitting: Cardiovascular Disease

## 2024-10-06 ENCOUNTER — Ambulatory Visit (HOSPITAL_COMMUNITY)
Admission: RE | Admit: 2024-10-06 | Discharge: 2024-10-06 | Disposition: A | Payer: Medicare HMO | Source: Ambulatory Visit | Attending: Cardiovascular Disease | Admitting: Cardiovascular Disease

## 2024-10-06 DIAGNOSIS — I340 Nonrheumatic mitral (valve) insufficiency: Secondary | ICD-10-CM | POA: Diagnosis not present

## 2024-10-06 DIAGNOSIS — I25118 Atherosclerotic heart disease of native coronary artery with other forms of angina pectoris: Secondary | ICD-10-CM

## 2024-10-06 DIAGNOSIS — E785 Hyperlipidemia, unspecified: Secondary | ICD-10-CM

## 2024-10-06 LAB — ECHOCARDIOGRAM COMPLETE
AV Mean grad: 17.3 mmHg
AV Peak grad: 28.5 mmHg
Ao pk vel: 2.67 m/s
Area-P 1/2: 4.15 cm2
MV M vel: 4.87 m/s
MV Peak grad: 94.9 mmHg
P 1/2 time: 449 ms
Radius: 0.5 cm
S' Lateral: 2.4 cm

## 2024-10-07 ENCOUNTER — Inpatient Hospital Stay: Admitting: Hematology and Oncology

## 2024-10-07 VITALS — BP 122/58 | HR 76 | Temp 97.6°F | Resp 18 | Ht 61.0 in | Wt 111.8 lb

## 2024-10-07 DIAGNOSIS — C50512 Malignant neoplasm of lower-outer quadrant of left female breast: Secondary | ICD-10-CM

## 2024-10-07 DIAGNOSIS — Z170 Estrogen receptor positive status [ER+]: Secondary | ICD-10-CM

## 2024-10-07 NOTE — Progress Notes (Signed)
 "  Patient Care Team: Chrystal Lamarr RAMAN, MD as PCP - General (Family Medicine) Court Dorn PARAS, MD as PCP - Cardiology (Cardiology) Cindie Ole DASEN, MD (Inactive) as PCP - Electrophysiology (Cardiology) Vanderbilt Ned, MD as Surgeon (General Surgery) Dewey Rush, MD as Consulting Physician (Radiation Oncology) Odean Potts, MD as Consulting Physician (Hematology and Oncology)  DIAGNOSIS:  Encounter Diagnosis  Name Primary?   Malignant neoplasm of lower-outer quadrant of left breast of female, estrogen receptor positive (HCC) Yes    SUMMARY OF ONCOLOGIC HISTORY: Oncology History  Malignant neoplasm of lower-outer quadrant of left breast of female, estrogen receptor positive (HCC)  01/18/2020 Initial Diagnosis   Palpable left breast lump with calcifications at 5 o'clock position measuring 3.3 cm.  Biopsy revealed grade 3 IDC ER 95%, PR 0%, HER-2 3+ positive, Ki-67 50%, axilla negative, T2N0   01/26/2020 Cancer Staging   Staging form: Breast, AJCC 8th Edition - Clinical stage from 01/26/2020: Stage IIA (cT2, cN0, cM0, G3, ER+, PR-, HER2+) - Signed by Odean Potts, MD on 01/26/2020   02/16/2020 - 11/16/2020 Neo-Adjuvant Chemotherapy   Neoadjuvant weekly Taxol /Herceptin  x 12 from 02/16/2020-05/13/2020, followed by Herceptin  maintenance every 3 weeks, last given on 11/16/2020 stopped early due to increased fatigue.     06/15/2020 Surgery   Left lumpectomy ([facility]): no residual carcinoma, 4 left axillary lymph nodes negative for carcinoma   06/15/2020 Cancer Staging   Staging form: Breast, AJCC 8th Edition - Pathologic stage from 06/15/2020: No Stage Recommended (ypT0, pN0, cM0) - Signed by Crawford Morna Pickle, NP on 03/07/2021 Stage prefix: Post-therapy Response to neoadjuvant therapy: Complete response   07/23/2020 - 08/22/2020 Radiation Therapy   Adjuvant radiation [facility]): The patient initially received a dose of 42.56 Gy in 16 fractions to the breast using whole-breast  tangent fields. This was delivered using a 3-D conformal technique. The patient then received a boost to the seroma. This delivered an additional 8 Gy in 4 fractions using a 3 field photon technique due to the depth of the seroma. The total dose was 50.56 Gy.   09/2020 -  Anti-estrogen oral therapy   Anastrozole  daily     CHIEF COMPLIANT: Follow-up on anastrozole  therapy  HISTORY OF PRESENT ILLNESS: History of Present Illness Rachel Valentine is an 84 year old female with ER-positive left breast cancer on adjuvant anastrozole  therapy who presents for routine oncology follow-up.  She has taken adjuvant anastrozole  since early 2022 and reports significant alopecia since initiation, which she feels is more severe than in others on this drug. She has no new breast symptoms. Her most recent mammogram showed stable findings with persistent category C breast density, unchanged from prior. She denies fevers, chills, or sweats.  She has osteoporosis with a stable T-score of -2.6. She stopped Prolia  injections, previously given every 6 months, after developing palpitations that she attributes to the medication and has not resumed therapy. She is worried about medications that might worsen her heart symptoms, though she feels her cardiac status is currently stable.  She otherwise feels well and has no additional new or concerning symptoms.     ALLERGIES:  is allergic to paclitaxel , fosamax [alendronate], prednisone, clindamycin /lincomycin, latex, and penicillins.  MEDICATIONS:  Current Outpatient Medications  Medication Sig Dispense Refill   acetaminophen  (TYLENOL ) 500 MG tablet Take 500 mg by mouth every 6 (six) hours as needed for moderate pain (pain score 4-6).     anastrozole  (ARIMIDEX ) 1 MG tablet TAKE 1 TABLET BY MOUTH EVERY DAY 90 tablet 3  aspirin  EC 81 MG tablet Take 81 mg by mouth daily. Swallow whole.     Biotin 89999 MCG TABS Take 10,000 mg by mouth daily.      ciclopirox   (PENLAC ) 8 % solution Apply topically at bedtime. Apply over nail and surrounding skin. Apply daily over previous coat. After seven (7) days, may remove with alcohol and continue cycle. (Patient taking differently: Apply 1 Application topically at bedtime as needed (fungus). Apply over nail and surrounding skin. Apply daily over previous coat. After seven (7) days, may remove with alcohol and continue cycle.) 6.6 mL 0   furosemide  (LASIX ) 20 MG tablet Take 1 tablet (20 mg total) by mouth as needed.     ipratropium (ATROVENT) 0.03 % nasal spray Place 2 sprays into both nostrils daily. May use a second time as needed for allergies     isosorbide  mononitrate (IMDUR ) 30 MG 24 hr tablet Take 0.5 tablets (15 mg total) by mouth daily. 45 tablet 3   metoprolol  succinate (TOPROL -XL) 25 MG 24 hr tablet Take 12.5 mg by mouth daily.     Multiple Vitamins-Minerals (CITRACAL +D3 PO) Take 1 tablet by mouth daily.     nitroGLYCERIN  (NITROSTAT ) 0.4 MG SL tablet Place 1 tablet (0.4 mg total) under the tongue every 5 (five) minutes as needed for chest pain. 30 tablet 0   pantoprazole  (PROTONIX ) 40 MG tablet Take 1 tablet (40 mg total) by mouth daily. 90 tablet 1   rosuvastatin  (CRESTOR ) 40 MG tablet Take 40 mg by mouth daily.     valACYclovir (VALTREX) 1000 MG tablet Take 2,000 mg by mouth 2 (two) times daily as needed (fever blisters).     No current facility-administered medications for this visit.    PHYSICAL EXAMINATION: ECOG PERFORMANCE STATUS: 1 - Symptomatic but completely ambulatory  Vitals:   10/07/24 1114  BP: (!) 122/58  Pulse: 76  Resp: 18  Temp: 97.6 F (36.4 C)  SpO2: 100%   Filed Weights   10/07/24 1114  Weight: 111 lb 12.8 oz (50.7 kg)    LABORATORY DATA:  I have reviewed the data as listed    Latest Ref Rng & Units 01/22/2024    2:36 PM 01/22/2024    2:33 PM 01/22/2024    2:29 PM  CMP  Sodium 135 - 145 mmol/L 142  143  141   Potassium 3.5 - 5.1 mmol/L 3.7  3.4  3.7     Lab  Results  Component Value Date   WBC 6.0 01/12/2024   HGB 12.2 01/22/2024   HCT 36.0 01/22/2024   MCV 93 01/12/2024   PLT 285 01/12/2024   NEUTROABS 3.3 10/22/2021    ASSESSMENT & PLAN:  Malignant neoplasm of lower-outer quadrant of left breast of female, estrogen receptor positive (HCC) 01/18/2020:Palpable left breast lump with calcifications at 5 o'clock position measuring 3.3 cm.  Biopsy revealed grade 3 IDC ER 95%, PR 0%, HER-2 3+ positive, Ki-67 50%, axilla negative, T2N0 stage IIa clinical stage Bilateral implants removed 2005   Treatment plan: 1.  Neo- adjuvant chemotherapy with Taxol  Herceptin   2.  06/15/20: Left lumpectomy ([facility]): no residual carcinoma, 4 left axillary lymph nodes negative for carcinoma 3.  Adjuvant radiation therapy completed 08/21/2020 4.  Adjuvant antiestrogen therapy with anastrozole  1 mg daily x5 years started January 2022   Breast MRI: 2.3 cm mass, indeterminate 8 mm mass (Biopsy: ADH), no LN --------------------------------------------------------------------------------------------------------------------------------------------------------------- Anastrozole  toxicities : Denies any adverse effects to anastrozole .  Denies any hot flashes or myalgias. Osteoporosis:  In 2018 at bone density was T score -2.6:  Prolia  injections every 6 months.   Bone density 10/28/2023: T-score -2.6 stable   Breast cancer surveillance: 1.  Breast exam 10/07/2024: Palpable nodule in the left breast 5 o'clock position 2. mammogram 05/31/2024: Benign breast density category C Biopsy 10/09/2022: Benign Bone density 09/18/2021: T score -2.4: Severe osteopenia but improved from 2018 (was -2.6)    Xgeva  caused to palpitations therefore we will switch her to Zometa annually  Return to clinic in 1 year for follow-up with labs and Zometa as well Assessment & Plan Estrogen receptor positive breast cancer of the left breast, lower-outer quadrant Nearly five years post-diagnosis.  Completed four years of anastrozole , plans to discontinue after one more year. Significant alopecia attributed to anastrozole . Recent mammogram stable with persistent C-category density, no new symptoms. - Continue anastrozole  for one additional year to complete five-year adjuvant course. - Reviewed recent mammogram results and provided reassurance regarding stable findings and breast density. - Discussed anticipated discontinuation of anastrozole  after completion of five-year course.  Osteoporosis Osteoporosis well-managed with T-score of -2.6. Discontinued denosumab  due to palpitations. Zolendronic acid recommended due to lower cardiac risk, patient agreeable. - Discontinued denosumab  (Prolia ) due to palpitations. - Recommended transition to annual zolendronic acid (Zometa) infusion. - Planned to obtain insurance authorization for Zometa. - Scheduled Zometa infusion in two weeks. - Planned to obtain laboratory studies, including serum calcium , prior to Zometa infusion.      No orders of the defined types were placed in this encounter.  The patient has a good understanding of the overall plan. she agrees with it. she will call with any problems that may develop before the next visit here.  I personally spent a total of 30 minutes in the care of the patient today including preparing to see the patient, getting/reviewing separately obtained history, performing a medically appropriate exam/evaluation, counseling and educating, placing orders, referring and communicating with other health care professionals, documenting clinical information in the EHR, independently interpreting results, communicating results, and coordinating care.   Dr.Francess Mullen 10/07/24    "

## 2024-10-07 NOTE — Assessment & Plan Note (Signed)
 01/18/2020:Palpable left breast lump with calcifications at 5 o'clock position measuring 3.3 cm.  Biopsy revealed grade 3 IDC ER 95%, PR 0%, HER-2 3+ positive, Ki-67 50%, axilla negative, T2N0 stage IIa clinical stage Bilateral implants removed 2005   Treatment plan: 1.  Neo- adjuvant chemotherapy with Taxol  Herceptin   2.  Breast conserving surgery with sentinel lymph node biopsy 3.  Adjuvant radiation therapy completed 08/21/2020 4.  Adjuvant antiestrogen therapy with anastrozole  1 mg daily x5 years   Breast MRI: 2.3 cm mass, indeterminate 8 mm mass (Biopsy: AD[facility]), no LN --------------------------------------------------------------------------------------------------------------------------------------------------------------- 06/15/20: Left lumpectomy ([facility]): no residual carcinoma, 4 left axillary lymph nodes negative for carcinoma   Plan: 1. Anti-HER-2 therapy  11/16/2020 (stopped early due to fatigue) 2. adjuvant radiation therapy completed 08/21/2020 3.  Adjuvant antiestrogen therapy with anastrozole  1 mg daily x5 years  started January 2022) --------------------------------------------------------------------------------------------------------------------   Anastrozole  toxicities : Denies any adverse effects to anastrozole .  Denies any hot flashes or myalgias. Osteoporosis: In 2018 at bone density was T score -2.6:  Prolia  injections every 6 months.     Breast cancer surveillance: 1.  Breast exam 10/07/2024: Palpable nodule in the left breast 5 o'clock position 2. mammogram 05/31/2024: Benign breast density category C Biopsy 10/09/2022: Benign Bone density 09/18/2021: T score -2.4: Severe osteopenia but improved from 2018 (was -2.6)   Osteopenia Last bone density test was 2 years ago with a score of -2.4, bordering osteoporosis. -Schedule a follow-up bone density test    Return to clinic in 1 year for follow-up

## 2024-10-20 ENCOUNTER — Inpatient Hospital Stay

## 2024-11-11 ENCOUNTER — Ambulatory Visit: Admitting: Physician Assistant

## 2025-10-20 ENCOUNTER — Inpatient Hospital Stay

## 2025-10-20 ENCOUNTER — Inpatient Hospital Stay: Admitting: Hematology and Oncology
